# Patient Record
Sex: Female | Born: 1965 | Race: White | Hispanic: No | Marital: Married | Smoking: Former smoker
Health system: Southern US, Community
[De-identification: ages and names within clinical notes are randomized; demographics above are authoritative.]

## PROBLEM LIST (undated history)

## (undated) DIAGNOSIS — G4733 Obstructive sleep apnea (adult) (pediatric): Secondary | ICD-10-CM

## (undated) DIAGNOSIS — G4700 Insomnia, unspecified: Secondary | ICD-10-CM

## (undated) DIAGNOSIS — K649 Unspecified hemorrhoids: Secondary | ICD-10-CM

## (undated) DIAGNOSIS — I499 Cardiac arrhythmia, unspecified: Secondary | ICD-10-CM

## (undated) DIAGNOSIS — G4730 Sleep apnea, unspecified: Secondary | ICD-10-CM

## (undated) DIAGNOSIS — N189 Chronic kidney disease, unspecified: Secondary | ICD-10-CM

## (undated) DIAGNOSIS — H269 Unspecified cataract: Secondary | ICD-10-CM

## (undated) DIAGNOSIS — E282 Polycystic ovarian syndrome: Secondary | ICD-10-CM

## (undated) DIAGNOSIS — K219 Gastro-esophageal reflux disease without esophagitis: Secondary | ICD-10-CM

## (undated) DIAGNOSIS — R011 Cardiac murmur, unspecified: Secondary | ICD-10-CM

## (undated) DIAGNOSIS — F419 Anxiety disorder, unspecified: Secondary | ICD-10-CM

## (undated) DIAGNOSIS — L03119 Cellulitis of unspecified part of limb: Secondary | ICD-10-CM

## (undated) DIAGNOSIS — R42 Dizziness and giddiness: Secondary | ICD-10-CM

## (undated) DIAGNOSIS — F32A Depression, unspecified: Secondary | ICD-10-CM

## (undated) DIAGNOSIS — E0590 Thyrotoxicosis, unspecified without thyrotoxic crisis or storm: Secondary | ICD-10-CM

## (undated) DIAGNOSIS — I509 Heart failure, unspecified: Secondary | ICD-10-CM

## (undated) DIAGNOSIS — L02419 Cutaneous abscess of limb, unspecified: Secondary | ICD-10-CM

## (undated) DIAGNOSIS — C73 Malignant neoplasm of thyroid gland: Secondary | ICD-10-CM

## (undated) DIAGNOSIS — K2970 Gastritis, unspecified, without bleeding: Secondary | ICD-10-CM

## (undated) DIAGNOSIS — T8859XA Other complications of anesthesia, initial encounter: Secondary | ICD-10-CM

## (undated) DIAGNOSIS — T7840XA Allergy, unspecified, initial encounter: Secondary | ICD-10-CM

## (undated) DIAGNOSIS — G709 Myoneural disorder, unspecified: Secondary | ICD-10-CM

## (undated) DIAGNOSIS — M1990 Unspecified osteoarthritis, unspecified site: Secondary | ICD-10-CM

## (undated) DIAGNOSIS — T4145XA Adverse effect of unspecified anesthetic, initial encounter: Secondary | ICD-10-CM

## (undated) DIAGNOSIS — F329 Major depressive disorder, single episode, unspecified: Secondary | ICD-10-CM

## (undated) DIAGNOSIS — I10 Essential (primary) hypertension: Secondary | ICD-10-CM

## (undated) DIAGNOSIS — D649 Anemia, unspecified: Secondary | ICD-10-CM

## (undated) DIAGNOSIS — E785 Hyperlipidemia, unspecified: Secondary | ICD-10-CM

## (undated) HISTORY — DX: Depression, unspecified: F32.A

## (undated) HISTORY — PX: CHOLECYSTECTOMY: SHX55

## (undated) HISTORY — DX: Polycystic ovarian syndrome: E28.2

## (undated) HISTORY — PX: TUBAL LIGATION: SHX77

## (undated) HISTORY — DX: Essential (primary) hypertension: I10

## (undated) HISTORY — DX: Hyperlipidemia, unspecified: E78.5

## (undated) HISTORY — DX: Insomnia, unspecified: G47.00

## (undated) HISTORY — DX: Myoneural disorder, unspecified: G70.9

## (undated) HISTORY — DX: Anemia, unspecified: D64.9

## (undated) HISTORY — DX: Sleep apnea, unspecified: G47.30

## (undated) HISTORY — DX: Unspecified cataract: H26.9

## (undated) HISTORY — DX: Cardiac murmur, unspecified: R01.1

## (undated) HISTORY — DX: Malignant neoplasm of thyroid gland: C73

## (undated) HISTORY — PX: HEMORRHOID SURGERY: SHX153

## (undated) HISTORY — DX: Major depressive disorder, single episode, unspecified: F32.9

## (undated) HISTORY — PX: EYE SURGERY: SHX253

## (undated) HISTORY — DX: Allergy, unspecified, initial encounter: T78.40XA

## (undated) HISTORY — DX: Dizziness and giddiness: R42

## (undated) HISTORY — PX: TONSILLECTOMY AND ADENOIDECTOMY: SUR1326

## (undated) HISTORY — DX: Gastritis, unspecified, without bleeding: K29.70

---
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
## 1999-07-15 ENCOUNTER — Emergency Department (HOSPITAL_COMMUNITY): Admission: EM | Admit: 1999-07-15 | Discharge: 1999-07-15 | Payer: Self-pay | Admitting: *Deleted

## 1999-07-31 ENCOUNTER — Ambulatory Visit (HOSPITAL_COMMUNITY): Admission: RE | Admit: 1999-07-31 | Discharge: 1999-07-31 | Payer: Self-pay | Admitting: Gastroenterology

## 1999-11-02 ENCOUNTER — Other Ambulatory Visit: Admission: RE | Admit: 1999-11-02 | Discharge: 1999-11-02 | Payer: Self-pay | Admitting: Family Medicine

## 2001-01-08 ENCOUNTER — Emergency Department (HOSPITAL_COMMUNITY): Admission: EM | Admit: 2001-01-08 | Discharge: 2001-01-08 | Payer: Self-pay | Admitting: Emergency Medicine

## 2001-01-13 ENCOUNTER — Other Ambulatory Visit: Admission: RE | Admit: 2001-01-13 | Discharge: 2001-01-13 | Payer: Self-pay | Admitting: Obstetrics and Gynecology

## 2002-06-16 ENCOUNTER — Encounter: Admission: RE | Admit: 2002-06-16 | Discharge: 2002-06-16 | Payer: Self-pay | Admitting: Occupational Medicine

## 2002-06-16 ENCOUNTER — Encounter: Payer: Self-pay | Admitting: Occupational Medicine

## 2003-03-26 ENCOUNTER — Encounter: Payer: Self-pay | Admitting: Family Medicine

## 2004-04-25 ENCOUNTER — Encounter: Admission: RE | Admit: 2004-04-25 | Discharge: 2004-04-25 | Payer: Self-pay | Admitting: Obstetrics and Gynecology

## 2004-04-27 ENCOUNTER — Ambulatory Visit: Payer: Self-pay | Admitting: Family Medicine

## 2004-05-25 ENCOUNTER — Ambulatory Visit: Payer: Self-pay | Admitting: Family Medicine

## 2004-08-31 ENCOUNTER — Ambulatory Visit: Payer: Self-pay | Admitting: Family Medicine

## 2004-10-10 ENCOUNTER — Inpatient Hospital Stay (HOSPITAL_COMMUNITY): Admission: EM | Admit: 2004-10-10 | Discharge: 2004-10-13 | Payer: Self-pay | Admitting: Emergency Medicine

## 2004-10-11 ENCOUNTER — Ambulatory Visit: Payer: Self-pay | Admitting: Cardiology

## 2004-10-12 ENCOUNTER — Ambulatory Visit: Payer: Self-pay | Admitting: Internal Medicine

## 2004-10-24 ENCOUNTER — Ambulatory Visit: Payer: Self-pay | Admitting: Family Medicine

## 2004-11-27 ENCOUNTER — Ambulatory Visit: Payer: Self-pay | Admitting: Internal Medicine

## 2004-12-27 ENCOUNTER — Ambulatory Visit: Payer: Self-pay | Admitting: Gastroenterology

## 2005-01-28 ENCOUNTER — Ambulatory Visit: Payer: Self-pay | Admitting: Psychology

## 2005-02-19 ENCOUNTER — Ambulatory Visit: Payer: Self-pay | Admitting: Family Medicine

## 2005-02-26 ENCOUNTER — Ambulatory Visit (HOSPITAL_COMMUNITY): Admission: RE | Admit: 2005-02-26 | Discharge: 2005-02-26 | Payer: Self-pay | Admitting: Surgery

## 2005-02-26 ENCOUNTER — Encounter (INDEPENDENT_AMBULATORY_CARE_PROVIDER_SITE_OTHER): Payer: Self-pay | Admitting: *Deleted

## 2005-08-29 ENCOUNTER — Ambulatory Visit: Payer: Self-pay | Admitting: Family Medicine

## 2005-09-10 ENCOUNTER — Ambulatory Visit: Payer: Self-pay | Admitting: Internal Medicine

## 2005-09-18 ENCOUNTER — Encounter: Admission: RE | Admit: 2005-09-18 | Discharge: 2005-12-17 | Payer: Self-pay | Admitting: Family Medicine

## 2005-12-05 ENCOUNTER — Encounter: Admission: RE | Admit: 2005-12-05 | Discharge: 2006-03-05 | Payer: Self-pay | Admitting: Family Medicine

## 2006-01-17 ENCOUNTER — Encounter: Admission: RE | Admit: 2006-01-17 | Discharge: 2006-04-17 | Payer: Self-pay | Admitting: Family Medicine

## 2006-01-24 ENCOUNTER — Ambulatory Visit: Payer: Self-pay | Admitting: Family Medicine

## 2006-03-26 ENCOUNTER — Ambulatory Visit: Payer: Self-pay | Admitting: Family Medicine

## 2006-04-09 ENCOUNTER — Ambulatory Visit: Payer: Self-pay | Admitting: Family Medicine

## 2006-05-12 ENCOUNTER — Ambulatory Visit: Payer: Self-pay | Admitting: Family Medicine

## 2006-06-17 ENCOUNTER — Ambulatory Visit: Payer: Self-pay | Admitting: Family Medicine

## 2006-06-17 LAB — CONVERTED CEMR LAB
ALT: 17 units/L (ref 0–40)
BUN: 5 mg/dL — ABNORMAL LOW (ref 6–23)
Basophils Absolute: 0.4 10*3/uL — ABNORMAL HIGH (ref 0.0–0.1)
Calcium: 9.2 mg/dL (ref 8.4–10.5)
Chloride: 107 meq/L (ref 96–112)
Eosinophils Absolute: 0.5 10*3/uL (ref 0.0–0.6)
Eosinophils Relative: 5.8 % — ABNORMAL HIGH (ref 0.0–5.0)
GFR calc Af Amer: 176 mL/min
Glucose, Bld: 118 mg/dL — ABNORMAL HIGH (ref 70–99)
Monocytes Absolute: 0.2 10*3/uL (ref 0.2–0.7)
Neutro Abs: 6.2 10*3/uL (ref 1.4–7.7)
Phosphorus: 3.8 mg/dL (ref 2.3–4.6)
Potassium: 4.2 meq/L (ref 3.5–5.1)
RBC: 3.97 M/uL (ref 3.87–5.11)

## 2006-07-07 ENCOUNTER — Ambulatory Visit: Payer: Self-pay | Admitting: Family Medicine

## 2006-07-14 ENCOUNTER — Ambulatory Visit: Payer: Self-pay | Admitting: Gastroenterology

## 2006-07-14 LAB — FECAL OCCULT BLOOD, GUAIAC: Fecal Occult Blood: POSITIVE

## 2006-07-18 ENCOUNTER — Ambulatory Visit: Payer: Self-pay | Admitting: Family Medicine

## 2006-09-11 ENCOUNTER — Encounter: Payer: Self-pay | Admitting: Family Medicine

## 2006-09-11 DIAGNOSIS — K644 Residual hemorrhoidal skin tags: Secondary | ICD-10-CM | POA: Insufficient documentation

## 2006-09-11 DIAGNOSIS — E1165 Type 2 diabetes mellitus with hyperglycemia: Secondary | ICD-10-CM | POA: Insufficient documentation

## 2006-09-11 DIAGNOSIS — J45909 Unspecified asthma, uncomplicated: Secondary | ICD-10-CM | POA: Insufficient documentation

## 2006-09-11 DIAGNOSIS — E282 Polycystic ovarian syndrome: Secondary | ICD-10-CM | POA: Insufficient documentation

## 2006-09-11 DIAGNOSIS — F418 Other specified anxiety disorders: Secondary | ICD-10-CM | POA: Insufficient documentation

## 2006-09-11 DIAGNOSIS — E079 Disorder of thyroid, unspecified: Secondary | ICD-10-CM | POA: Insufficient documentation

## 2006-09-11 DIAGNOSIS — E1142 Type 2 diabetes mellitus with diabetic polyneuropathy: Secondary | ICD-10-CM | POA: Insufficient documentation

## 2006-09-11 DIAGNOSIS — D509 Iron deficiency anemia, unspecified: Secondary | ICD-10-CM | POA: Insufficient documentation

## 2006-09-11 DIAGNOSIS — E1140 Type 2 diabetes mellitus with diabetic neuropathy, unspecified: Secondary | ICD-10-CM | POA: Insufficient documentation

## 2006-11-05 ENCOUNTER — Telehealth: Payer: Self-pay | Admitting: Family Medicine

## 2007-02-06 ENCOUNTER — Ambulatory Visit: Payer: Self-pay | Admitting: Family Medicine

## 2007-03-09 ENCOUNTER — Ambulatory Visit: Payer: Self-pay | Admitting: Family Medicine

## 2007-03-09 DIAGNOSIS — G478 Other sleep disorders: Secondary | ICD-10-CM | POA: Insufficient documentation

## 2007-03-10 LAB — CONVERTED CEMR LAB
Basophils Relative: 0.3 % (ref 0.0–1.0)
Calcium: 9.1 mg/dL (ref 8.4–10.5)
Chloride: 101 meq/L (ref 96–112)
Cholesterol: 193 mg/dL (ref 0–200)
Creatinine, Ser: 0.6 mg/dL (ref 0.4–1.2)
Eosinophils Relative: 7.6 % — ABNORMAL HIGH (ref 0.0–5.0)
Glucose, Bld: 218 mg/dL — ABNORMAL HIGH (ref 70–99)
HDL: 30 mg/dL — ABNORMAL LOW (ref >39.0)
Hgb A1c MFr Bld: 7.3 % — ABNORMAL HIGH (ref 4.6–6.0)
Lymphocytes Relative: 17.3 % (ref 12.0–46.0)
MCHC: 33.8 g/dL (ref 30.0–36.0)
Monocytes Absolute: 0.4 10*3/uL (ref 0.2–0.7)
Phosphorus: 3.3 mg/dL (ref 2.3–4.6)
Platelets: 267 10*3/uL (ref 150–400)
Potassium: 4.2 meq/L (ref 3.5–5.1)
RDW: 14.7 % — ABNORMAL HIGH (ref 11.5–14.6)
WBC: 8.9 10*3/uL (ref 4.5–10.5)

## 2007-05-14 ENCOUNTER — Ambulatory Visit: Payer: Self-pay | Admitting: Family Medicine

## 2007-05-14 DIAGNOSIS — E782 Mixed hyperlipidemia: Secondary | ICD-10-CM | POA: Insufficient documentation

## 2007-05-14 DIAGNOSIS — E1169 Type 2 diabetes mellitus with other specified complication: Secondary | ICD-10-CM | POA: Insufficient documentation

## 2007-06-02 ENCOUNTER — Telehealth: Payer: Self-pay | Admitting: Family Medicine

## 2007-06-30 ENCOUNTER — Ambulatory Visit: Payer: Self-pay | Admitting: Family Medicine

## 2007-07-02 ENCOUNTER — Telehealth: Payer: Self-pay | Admitting: Family Medicine

## 2007-07-02 LAB — CONVERTED CEMR LAB
Hgb A1c MFr Bld: 7.1 % — ABNORMAL HIGH (ref 4.6–6.0)
Total CHOL/HDL Ratio: 5.7
Triglycerides: 132 mg/dL (ref 0–149)

## 2007-07-13 ENCOUNTER — Ambulatory Visit: Payer: Self-pay | Admitting: Family Medicine

## 2007-08-24 ENCOUNTER — Telehealth: Payer: Self-pay | Admitting: Family Medicine

## 2007-10-13 ENCOUNTER — Ambulatory Visit: Payer: Self-pay | Admitting: Family Medicine

## 2007-10-15 LAB — CONVERTED CEMR LAB
Albumin: 3.7 g/dL (ref 3.5–5.2)
BUN: 8 mg/dL (ref 6–23)
Calcium: 9.2 mg/dL (ref 8.4–10.5)
Cholesterol: 161 mg/dL (ref 0–200)
Creatinine, Ser: 0.7 mg/dL (ref 0.4–1.2)
GFR calc Af Amer: 119 mL/min
Glucose, Bld: 167 mg/dL — ABNORMAL HIGH (ref 70–99)
Hgb A1c MFr Bld: 6.1 % — ABNORMAL HIGH (ref 4.6–6.0)
Phosphorus: 4 mg/dL (ref 2.3–4.6)
Sodium: 138 meq/L (ref 135–145)
Triglycerides: 275 mg/dL (ref 0–149)

## 2007-11-03 ENCOUNTER — Ambulatory Visit: Payer: Self-pay | Admitting: Family Medicine

## 2007-11-03 DIAGNOSIS — I10 Essential (primary) hypertension: Secondary | ICD-10-CM | POA: Insufficient documentation

## 2008-02-13 ENCOUNTER — Ambulatory Visit: Payer: Self-pay | Admitting: Internal Medicine

## 2008-02-13 ENCOUNTER — Encounter: Payer: Self-pay | Admitting: Internal Medicine

## 2008-02-13 ENCOUNTER — Emergency Department (HOSPITAL_COMMUNITY): Admission: EM | Admit: 2008-02-13 | Discharge: 2008-02-13 | Payer: Self-pay | Admitting: Emergency Medicine

## 2008-02-17 ENCOUNTER — Telehealth: Payer: Self-pay | Admitting: Family Medicine

## 2008-04-20 ENCOUNTER — Ambulatory Visit: Payer: Self-pay | Admitting: Family Medicine

## 2008-04-21 LAB — CONVERTED CEMR LAB
Albumin: 3.5 g/dL (ref 3.5–5.2)
Basophils Relative: 0.3 % (ref 0.0–3.0)
Creatinine,U: 305.8 mg/dL
Eosinophils Relative: 9.6 % — ABNORMAL HIGH (ref 0.0–5.0)
Hemoglobin: 12.6 g/dL (ref 12.0–15.0)
Hgb A1c MFr Bld: 6.7 % — ABNORMAL HIGH (ref 4.6–6.0)
Lymphocytes Relative: 17.3 % (ref 12.0–46.0)
MCV: 85.8 fL (ref 78.0–100.0)
Microalb, Ur: 3.9 mg/dL — ABNORMAL HIGH (ref 0.0–1.9)
Monocytes Absolute: 0.6 10*3/uL (ref 0.1–1.0)
Monocytes Relative: 5.3 % (ref 3.0–12.0)
Neutrophils Relative %: 67.5 % (ref 43.0–77.0)
Phosphorus: 3.9 mg/dL (ref 2.3–4.6)
Platelets: 309 10*3/uL (ref 150–400)
RBC: 4.41 M/uL (ref 3.87–5.11)
RDW: 14.8 % — ABNORMAL HIGH (ref 11.5–14.6)
Total CHOL/HDL Ratio: 6
Triglycerides: 89 mg/dL (ref 0–149)

## 2008-04-27 ENCOUNTER — Ambulatory Visit: Payer: Self-pay | Admitting: Family Medicine

## 2008-04-27 DIAGNOSIS — K219 Gastro-esophageal reflux disease without esophagitis: Secondary | ICD-10-CM | POA: Insufficient documentation

## 2008-05-20 ENCOUNTER — Telehealth: Payer: Self-pay | Admitting: Family Medicine

## 2008-06-22 ENCOUNTER — Telehealth: Payer: Self-pay | Admitting: Family Medicine

## 2008-07-15 ENCOUNTER — Ambulatory Visit: Payer: Self-pay | Admitting: Family Medicine

## 2008-07-15 ENCOUNTER — Telehealth: Payer: Self-pay | Admitting: Family Medicine

## 2008-07-17 LAB — CONVERTED CEMR LAB
AST: 17 units/L (ref 0–37)
HDL: 28.1 mg/dL — ABNORMAL LOW (ref >39.00)
Hgb A1c MFr Bld: 6.9 % — ABNORMAL HIGH (ref 4.6–6.5)
LDL Cholesterol: 135 mg/dL — ABNORMAL HIGH (ref 0–99)
VLDL: 22.8 mg/dL (ref 0.0–40.0)

## 2008-07-20 ENCOUNTER — Ambulatory Visit: Payer: Self-pay | Admitting: Family Medicine

## 2008-08-17 ENCOUNTER — Telehealth: Payer: Self-pay | Admitting: Family Medicine

## 2008-08-19 ENCOUNTER — Ambulatory Visit: Payer: Self-pay | Admitting: Family Medicine

## 2008-08-19 LAB — CONVERTED CEMR LAB
Glucose, Urine, Semiquant: NEGATIVE
Nitrite: NEGATIVE
Protein, U semiquant: 100
Specific Gravity, Urine: >=1.03
Urobilinogen, UA: 0.2

## 2008-08-23 ENCOUNTER — Telehealth (INDEPENDENT_AMBULATORY_CARE_PROVIDER_SITE_OTHER): Payer: Self-pay | Admitting: Internal Medicine

## 2008-09-01 ENCOUNTER — Encounter (INDEPENDENT_AMBULATORY_CARE_PROVIDER_SITE_OTHER): Payer: Self-pay | Admitting: *Deleted

## 2008-09-27 ENCOUNTER — Telehealth: Payer: Self-pay | Admitting: Family Medicine

## 2008-09-30 ENCOUNTER — Ambulatory Visit: Payer: Self-pay | Admitting: Family Medicine

## 2008-10-03 LAB — CONVERTED CEMR LAB
ALT: 23 units/L (ref 0–35)
AST: 19 units/L (ref 0–37)
Triglycerides: 113 mg/dL (ref 0.0–149.0)
VLDL: 22.6 mg/dL (ref 0.0–40.0)

## 2008-10-26 ENCOUNTER — Telehealth: Payer: Self-pay | Admitting: Family Medicine

## 2008-11-04 ENCOUNTER — Telehealth: Payer: Self-pay | Admitting: Family Medicine

## 2008-11-09 ENCOUNTER — Encounter: Payer: Self-pay | Admitting: Family Medicine

## 2008-11-14 ENCOUNTER — Telehealth: Payer: Self-pay | Admitting: Family Medicine

## 2009-01-03 ENCOUNTER — Ambulatory Visit: Payer: Self-pay | Admitting: Family Medicine

## 2009-01-04 LAB — CONVERTED CEMR LAB
ALT: 27 units/L (ref 0–35)
AST: 21 units/L (ref 0–37)
Cholesterol: 132 mg/dL (ref 0–200)

## 2009-02-27 ENCOUNTER — Encounter: Payer: Self-pay | Admitting: Family Medicine

## 2009-02-27 ENCOUNTER — Ambulatory Visit: Payer: Self-pay | Admitting: Family Medicine

## 2009-03-20 ENCOUNTER — Telehealth: Payer: Self-pay | Admitting: Family Medicine

## 2009-03-25 HISTORY — PX: INTRAUTERINE DEVICE INSERTION: SHX323

## 2009-03-31 ENCOUNTER — Ambulatory Visit: Payer: Self-pay | Admitting: Family Medicine

## 2009-04-01 LAB — CONVERTED CEMR LAB: Hgb A1c MFr Bld: 7.4 % — ABNORMAL HIGH (ref 4.6–6.5)

## 2009-05-04 ENCOUNTER — Ambulatory Visit: Payer: Self-pay | Admitting: Family Medicine

## 2009-06-07 ENCOUNTER — Ambulatory Visit (HOSPITAL_COMMUNITY): Admission: RE | Admit: 2009-06-07 | Discharge: 2009-06-07 | Payer: Self-pay | Admitting: Orthopaedic Surgery

## 2009-06-23 HISTORY — PX: SHOULDER SURGERY: SHX246

## 2009-07-27 ENCOUNTER — Ambulatory Visit (HOSPITAL_BASED_OUTPATIENT_CLINIC_OR_DEPARTMENT_OTHER): Admission: RE | Admit: 2009-07-27 | Discharge: 2009-07-28 | Payer: Self-pay | Admitting: Orthopedic Surgery

## 2009-08-01 ENCOUNTER — Telehealth: Payer: Self-pay | Admitting: Family Medicine

## 2009-08-02 ENCOUNTER — Telehealth: Payer: Self-pay | Admitting: Family Medicine

## 2009-08-10 ENCOUNTER — Encounter: Payer: Self-pay | Admitting: Family Medicine

## 2009-08-14 ENCOUNTER — Ambulatory Visit: Payer: Self-pay | Admitting: Family Medicine

## 2009-08-14 LAB — CONVERTED CEMR LAB
BUN: 9 mg/dL (ref 6–23)
Calcium: 9.2 mg/dL (ref 8.4–10.5)
Chloride: 103 meq/L (ref 96–112)
GFR calc non Af Amer: 122.58 mL/min (ref >60)
Glucose, Bld: 208 mg/dL — ABNORMAL HIGH (ref 70–99)
HDL: 37.7 mg/dL — ABNORMAL LOW (ref >39.00)
Hgb A1c MFr Bld: 7.8 % — ABNORMAL HIGH (ref 4.6–6.5)
LDL Cholesterol: 65 mg/dL (ref 0–99)
Phosphorus: 4.4 mg/dL (ref 2.3–4.6)
Potassium: 4.3 meq/L (ref 3.5–5.1)
Sodium: 141 meq/L (ref 135–145)

## 2009-08-17 ENCOUNTER — Ambulatory Visit: Payer: Self-pay | Admitting: Family Medicine

## 2009-08-17 DIAGNOSIS — K648 Other hemorrhoids: Secondary | ICD-10-CM | POA: Insufficient documentation

## 2009-08-23 ENCOUNTER — Encounter: Payer: Self-pay | Admitting: Family Medicine

## 2009-08-23 LAB — CONVERTED CEMR LAB
Basophils Absolute: 0 10*3/uL (ref 0.0–0.1)
Eosinophils Absolute: 0.3 10*3/uL (ref 0.0–0.7)
Hemoglobin: 8.2 g/dL — ABNORMAL LOW (ref 12.0–15.0)
Lymphs Abs: 1.4 10*3/uL (ref 0.7–4.0)
MCHC: 31.6 g/dL (ref 30.0–36.0)
MCV: 85.1 fL (ref 78.0–100.0)
Monocytes Absolute: 0.4 10*3/uL (ref 0.1–1.0)
Neutrophils Relative %: 72.4 % (ref 43.0–77.0)
Platelets: 299 10*3/uL (ref 150.0–400.0)
Saturation Ratios: 14.8 % — ABNORMAL LOW (ref 20.0–50.0)

## 2009-09-22 ENCOUNTER — Ambulatory Visit: Payer: Self-pay | Admitting: Family Medicine

## 2009-09-27 LAB — CONVERTED CEMR LAB
Basophils Absolute: 0.1 10*3/uL (ref 0.0–0.1)
Eosinophils Absolute: 0.4 10*3/uL (ref 0.0–0.7)
MCV: 85.7 fL (ref 78.0–100.0)
Monocytes Absolute: 0.4 10*3/uL (ref 0.1–1.0)
Monocytes Relative: 5 % (ref 3.0–12.0)
Neutro Abs: 6.8 10*3/uL (ref 1.4–7.7)
Neutrophils Relative %: 77.2 % — ABNORMAL HIGH (ref 43.0–77.0)
RBC: 3.71 M/uL — ABNORMAL LOW (ref 3.87–5.11)
RDW: 18.4 % — ABNORMAL HIGH (ref 11.5–14.6)
WBC: 8.8 10*3/uL (ref 4.5–10.5)

## 2009-10-31 ENCOUNTER — Ambulatory Visit: Payer: Self-pay | Admitting: Gastroenterology

## 2009-10-31 ENCOUNTER — Encounter (INDEPENDENT_AMBULATORY_CARE_PROVIDER_SITE_OTHER): Payer: Self-pay | Admitting: *Deleted

## 2010-01-12 ENCOUNTER — Ambulatory Visit: Payer: Self-pay | Admitting: Family Medicine

## 2010-01-15 LAB — CONVERTED CEMR LAB
Basophils Absolute: 0.1 10*3/uL (ref 0.0–0.1)
Basophils Relative: 0.6 % (ref 0.0–3.0)
Eosinophils Absolute: 0.3 10*3/uL (ref 0.0–0.7)
Eosinophils Relative: 3.5 % (ref 0.0–5.0)
HCT: 33 % — ABNORMAL LOW (ref 36.0–46.0)
Lymphs Abs: 2 10*3/uL (ref 0.7–4.0)
MCHC: 33.2 g/dL (ref 30.0–36.0)
Monocytes Absolute: 0.5 10*3/uL (ref 0.1–1.0)
Neutro Abs: 6 10*3/uL (ref 1.4–7.7)
RBC: 3.84 M/uL — ABNORMAL LOW (ref 3.87–5.11)
RDW: 17.1 % — ABNORMAL HIGH (ref 11.5–14.6)
WBC: 8.8 10*3/uL (ref 4.5–10.5)

## 2010-01-16 ENCOUNTER — Ambulatory Visit: Payer: Self-pay | Admitting: Gastroenterology

## 2010-01-18 ENCOUNTER — Encounter: Payer: Self-pay | Admitting: Gastroenterology

## 2010-01-30 ENCOUNTER — Encounter: Payer: Self-pay | Admitting: Family Medicine

## 2010-04-16 ENCOUNTER — Ambulatory Visit: Admit: 2010-04-16 | Payer: Self-pay | Admitting: Family Medicine

## 2010-04-24 NOTE — Assessment & Plan Note (Signed)
GI PROBLEM LIST: 1. Symptomatic external hemorrhoids.  Status post colonoscopy October 2006 was otherwise normal.  Had surgical evaluation by Dr. Marcille Blanco.  Hemorrhoid treatments.  Very painful and disagreeable to patient.  This was in 2007.  Bleeds once to twice weekly from her hemorrhoids, significant volume per her. 2. Iron-deficiency anemia.  March 2008 labs show hemoglobin 10.9, ferritin 8.8, likely from significant external hemorrhoids.  See above. 3. Family history of colon cancer.  Last colonoscopy October 2006, next due October 2011.     History of Present Illness Visit Type: Initial Consult Primary GI MD: Rob Bunting MD Primary Provider: Roxy Manns, MD Requesting Provider: Roxy Manns, MD Chief Complaint: anemia History of Present Illness:     very pleasant 45 year old woman whom I last saw over 3 years ago.  who was found to have lower than usual Hb (8.2).   she was told to double her iron and 6 weeks later her hemoglobin was 10.3.    she has had chronic anemia, likely from daily bleeding hemorrhoids, see above summary.  She bleeds red blood every day.  She will really have to strain, and has blood and blood clots.    has trouble with knot in stomach, worse at night, can cuase SOB.  Knows she has panic attacks.    takes protonix daily and if she misses, she really has GERD.           Current Medications (verified): 1)  Micardis 40 Mg  Tabs (Telmisartan) .... Take One By Mouth Daily 2)  Zoloft 50 Mg  Tabs (Sertraline Hcl) .... Take One By Mouth At Bedtime 3)  Metformin Hcl 1000 Mg  Tabs (Metformin Hcl) .... Take 1 By Mouth Two Times A Day 4)  Ferrous Sulfate 325 (65 Fe) Mg  Tbec (Ferrous Sulfate) .... Take One By Mouth Two Times A Day. 5)  Wellbutrin Xl 150 Mg  Tb24 (Bupropion Hcl) .Marland Kitchen.. 1 By Mouth Once Daily 6)  Ambien 10 Mg  Tabs (Zolpidem Tartrate) .... 1/2 To 1 By Mouth At Bedtime Prn 7)  Proair Hfa 108 (90 Base) Mcg/act  Aers (Albuterol Sulfate) .... 2  Puffs Q 4 Hours As Needed Wheeze 8)  Diabetes Test Strips .... To Check Sugar Two Times A Day and As Needed For Dm 250.0 9)  Meclizine Hcl 25 Mg Tabs (Meclizine Hcl) .Marland Kitchen.. 1 By Mouth Two Times A Day As Needed Vertigo 10)  Protonix 40 Mg Tbec (Pantoprazole Sodium) .Marland Kitchen.. 1 By Mouth Once Daily 11)  Fish Oil   Oil (Fish Oil) .... Take 2  Tablets By Mouth Once A Day 12)  One Daily Womens  Tabs (Multiple Vitamins-Minerals) .... Take 1 Tablet By Mouth Once A Day 13)  Zocor 20 Mg Tabs (Simvastatin) .Marland Kitchen.. 1 By Mouth Once Daily in Evening 14)  Amaryl 2 Mg Tabs (Glimepiride) .Marland Kitchen.. 1 By Mouth Once Daily in Am  Allergies (verified): 1)  ! * Glucotrol 2)  ! Percocet 3)  Buspar  Vital Signs:  Patient profile:   45 year old female Height:      65 inches Weight:      293.13 pounds BMI:     48.96 Pulse rate:   88 / minute Pulse rhythm:   regular BP sitting:   160 / 80  (left arm) Cuff size:   large  Vitals Entered By: June McMurray CMA Duncan Dull) (October 31, 2009 3:37 PM)  Physical Exam  Additional Exam:  Constitutional: generally well appearing except for morbid  obesity Psychiatric: alert and oriented times 3 Abdomen: soft, non-tender, non-distended, normal bowel sounds    Impression & Recommendations:  Problem # 1:  chronic GERD, dyspepsia will proceed with EGD at the same time as colonoscopy  Problem # 2:  family history colon cancer, chronic rectal bleeding her rectal bleeding is likely hemorrhoidal. I saw her 3 years ago about the same issue and recommended she get back in to see a surgeon about. She is very nervous about repeating a surgical procedure however she does understand that that may indeed need to be done. I suggested we repeat her colonoscopy since she is due for high risk screening anyway. Following that I will likely schedule her for reevaluation by surgeon.  Patient Instructions: 1)  You will be scheduled to have a colonoscopy. 2)  You will be scheduled to have an upper  endoscopy. 3)  The medication list was reviewed and reconciled.  All changed / newly prescribed medications were explained.  A complete medication list was provided to the patient / caregiver.  Appended Document: Orders Update/movi    Clinical Lists Changes  Problems: Added new problem of RECTAL BLEEDING (ICD-569.3) Medications: Added new medication of MOVIPREP 100 GM  SOLR (PEG-KCL-NACL-NASULF-NA ASC-C) As per prep instructions. - Signed Rx of MOVIPREP 100 GM  SOLR (PEG-KCL-NACL-NASULF-NA ASC-C) As per prep instructions.;  #1 x 0;  Signed;  Entered by: Chales Abrahams CMA (AAMA);  Authorized by: Rachael Fee MD;  Method used: Electronically to [id number] [address] [place], [place], [place]  [address], Ph: [phone], Fax: [phone] Orders: Added new Test order of Colon/Endo (Colon/Endo) - Signed    Prescriptions: MOVIPREP 100 GM  SOLR (PEG-KCL-NACL-NASULF-NA ASC-C) As per prep instructions.  #1 x 0   Entered by:   Chales Abrahams CMA (AAMA)   Authorized by:   Rachael Fee MD   Signed by:   Chales Abrahams CMA (AAMA) on 10/31/2009   Method used:   [facility]  [address] [phone][facility] (retail)       [address]       [place], [place]  [address]       Ph: [phone]       Fax: [phone]   RxID:   [id number]

## 2010-04-24 NOTE — Assessment & Plan Note (Signed)
Summary: 6 month followup/whc   Vital Signs:  Patient profile:   45 year old female Height:      65 inches Weight:      285.75 pounds BMI:     47.72 Temp:     98.3 degrees F oral Pulse rate:   80 / minute Pulse rhythm:   regular BP sitting:   120 / 80  (left arm) Cuff size:   large  Vitals Entered By: Lewanda Rife LPN (May 04, 2009 9:30 AM)  History of Present Illness: here for f/u of DM and HTN and lipids  wt is up 12 lb is aware of this  has not been to the gym but not eating different  (had to help daughter on bedrest)  is sticking with a low sugar diet    bp 120/80- very good control  AIC is 7.4 up from 7.3 opthy exam -- was jan 23 , with [facility] opthy - all was ok   chol great with zocor LDL 66   has a new grandchild - born xmas day  very happy about that   had flu shot  bad case of gasteroenteritis  then another uri  finally feeling back to normal    Allergies: 1)  ! * Glucotrol 2)  Buspar  Past History:  Past Medical History: Last updated: 02/13/2008 Anemia-iron deficiency Asthma Depression Diabetes mellitus, type II obesity HTN  Colonoscopy- int hemorrhoids (07/1999) Admit- chest pain  Cardiac cath- neg,  chest CT- neg EF 65% (09/2004) Colonoscopy- ext hemorrhoids (12/2004)  Past Surgical History: Last updated: 02/13/2008 Cholecystectomy Hemorrhoidectomy Tubal ligation  Family History: Last updated: 04/27/2008 Father: colon cancer no DM in family  Social History: Last updated: 11/03/2007 Marital Status: Married Children: 2 Occupation: insurance Never Smoked walks for exercise   Risk Factors: Smoking Status: never (05/14/2007)  Review of Systems General:  Complains of fatigue; denies fever, loss of appetite, malaise, and sleep disorder. Eyes:  Denies blurring and eye irritation. CV:  Denies chest pain or discomfort, palpitations, shortness of breath with exertion, and swelling of feet. Resp:  Denies cough, shortness of  breath, and wheezing. GI:  Denies abdominal pain, indigestion, and nausea. GU:  Denies urinary frequency. MS:  Denies muscle aches. Derm:  Denies itching, lesion(s), poor wound healing, and rash. Neuro:  Denies numbness and tingling. Endo:  Denies excessive thirst and excessive urination. Heme:  Denies abnormal bruising and bleeding.  Physical Exam  General:  overweight but generally well appearing  Head:  normocephalic, atraumatic, and no abnormalities observed.   Eyes:  vision grossly intact, pupils equal, pupils round, and pupils reactive to light.   Mouth:  pharynx pink and moist.   Neck:  supple with full rom and no masses or thyromegally, no JVD or carotid bruit  Lungs:  Normal respiratory effort, chest expands symmetrically. Lungs are clear to auscultation, no crackles or wheezes. Heart:  Normal rate and regular rhythm. S1 and S2 normal without gallop, murmur, click, rub or other extra sounds. Abdomen:  Bowel sounds positive,abdomen soft and non-tender without masses, organomegaly or hernias noted. no renal bruits  Msk:  No deformity or scoliosis noted of thoracic or lumbar spine.   Pulses:  R and L carotid,radial,femoral,dorsalis pedis and posterior tibial pulses are full and equal bilaterally Extremities:  No clubbing, cyanosis, edema, or deformity noted with normal full range of motion of all joints.   Neurologic:  sensation intact to light touch, gait normal, and DTRs symmetrical and normal.   Skin:  Intact without suspicious lesions or rashes Cervical Nodes:  No lymphadenopathy noted Psych:  normal affect, talkative and pleasant   Diabetes Management Exam:    Foot Exam (with socks and/or shoes not present):       Sensory-Pinprick/Light touch:          Left medial foot (L-4): normal          Left dorsal foot (L-5): normal          Left lateral foot (S-1): normal          Right medial foot (L-4): normal          Right dorsal foot (L-5): normal          Right lateral foot  (S-1): normal       Sensory-Monofilament:          Left foot: normal          Right foot: normal       Inspection:          Left foot: normal          Right foot: normal       Nails:          Left foot: normal          Right foot: normal    Eye Exam:       Eye Exam done elsewhere          Date: 04/16/2009          Results: normal          Done by: Manley Mason opthy   Impression & Recommendations:  Problem # 1:  PURE HYPERCHOLESTEROLEMIA (ICD-272.0) Assessment Improved  much imp with statin and diet enc low sat fat recheck in 3 mo and f/u Her updated medication list for this problem includes:    Zocor 20 Mg Tabs (Simvastatin) .Marland Kitchen... 1 by mouth once daily in evening  Labs Reviewed: SGOT: 21 (01/03/2009)   SGPT: 27 (01/03/2009)   HDL:29.70 (01/03/2009), 30.80 (09/30/2008)  LDL:66 (01/03/2009), 84 (09/30/2008)  Chol:132 (01/03/2009), 137 (09/30/2008)  Trig:180.0 (01/03/2009), 113.0 (09/30/2008)  Orders: Prescription Created Electronically [phone])  Problem # 2:  HYPERTENSION (ICD-401.9) Assessment: Unchanged  good control on arb continue this  adv to get back to exercise  Her updated medication list for this problem includes:    Micardis 40 Mg Tabs (Telmisartan) .Marland Kitchen... Take one by mouth daily  BP today: 120/80 Prior BP: 112/78 (02/27/2009)  Labs Reviewed: K+: 4.2 (04/20/2008) Creat: : 0.7 (04/20/2008)   Chol: 132 (01/03/2009)   HDL: 29.70 (01/03/2009)   LDL: 66 (01/03/2009)   TG: 180.0 (01/03/2009)  Problem # 3:  DIABETES MELLITUS, TYPE II (ICD-250.00) Assessment: Deteriorated  not improving - due to poor exercise likely  will work on 30 min 5 days per week at home or gym wt loss is most imp up to date opthy  lab and f/u 3 mo- if not imp will add med  Her updated medication list for this problem includes:    Micardis 40 Mg Tabs (Telmisartan) .Marland Kitchen... Take one by mouth daily    Metformin Hcl 1000 Mg Tabs (Metformin hcl) .Marland Kitchen... Take 1 by mouth two times a day  Labs  Reviewed: Creat: 0.7 (04/20/2008)     Last Eye Exam: normal (03/25/2008) Reviewed HgBA1c results: 7.4 (03/31/2009)  7.3 (01/03/2009)  Complete Medication List: 1)  Micardis 40 Mg Tabs (Telmisartan) .... Take one by mouth daily 2)  Zoloft 50 Mg Tabs (Sertraline hcl) .... Take one by  mouth at bedtime 3)  Metformin Hcl 1000 Mg Tabs (Metformin hcl) .... Take 1 by mouth two times a day 4)  Ferrous Sulfate 325 (65 Fe) Mg Tbec (Ferrous sulfate) .... Take one by mouth daily 5)  Wellbutrin Xl 150 Mg Tb24 (Bupropion hcl) .Marland Kitchen.. 1 by mouth once daily 6)  Ambien 10 Mg Tabs (Zolpidem tartrate) .... 1/2 to 1 by mouth at bedtime prn 7)  Proair Hfa 108 (90 Base) Mcg/act Aers (Albuterol sulfate) .... 2 puffs q 4 hours as needed wheeze 8)  Diabetes Test Strips  .... To check sugar two times a day and as needed for dm 250.0 9)  Meclizine Hcl 25 Mg Tabs (Meclizine hcl) .Marland Kitchen.. 1 by mouth two times a day as needed vertigo 10)  Protonix 40 Mg Tbec (Pantoprazole sodium) .Marland Kitchen.. 1 by mouth once daily 11)  Fish Oil Oil (Fish oil) .... Take 2  tablets by mouth once a day 12)  One Daily Womens Tabs (Multiple vitamins-minerals) .... Take 1 tablet by mouth once a day 13)  Zocor 20 Mg Tabs (Simvastatin) .Marland Kitchen.. 1 by mouth once daily in evening  Patient Instructions: 1)  work harder on healthy diet and exercise for weight loss and sugar control  2)  keep checking sugar once daily at different times  3)  no change in medication 4)  schedule fasting labs in 3 months and then follow up lipid/ast alt/ renal / AIC 250.0 , 272 and then follow up  Prescriptions: ZOCOR 20 MG TABS (SIMVASTATIN) 1 by mouth once daily in evening  #30 x 11   Entered and Authorized by:   Judith Part MD   Signed by:   Judith Part MD on 05/04/2009   Method used:   [facility]        [id number]       [address]       [place], [place]  [address]       Ph: [phone]       Fax: [phone]    RxID:   [id number] PROTONIX 40 MG TBEC (PANTOPRAZOLE SODIUM) 1 by mouth once daily  #30 x 11   Entered and Authorized by:   Judith Part MD   Signed by:   Judith Part MD on 05/04/2009   Method used:   [facility]ically [facility]        [id number]       [address]       [place], [place]  [address]       Ph: [phone]       Fax: [phone]   RxID:   [id number] MECLIZINE HCL 25 MG TABS (MECLIZINE HCL) 1 by mouth two times a day as needed vertigo  #30 x 0   Entered and Authorized by:   Judith Part MD   Signed by:   Judith Part MD on 05/04/2009   Method used:   [facility]ically to        [id number]       [address]       [place], [place]  [address]       Ph: [phone]       Fax: [phone]   RxID:   [id number] WELLBUTRIN XL 150 MG  TB24 (BUPROPION HCL) 1 by mouth once daily  #30 x 11   Entered and Authorized by:   Judith Part MD   Signed by:   Judith Part MD on 05/04/2009   Method used:   [facility]ically to        [id number]       [address]       [place], [place]  [address]       Ph: [phone]       Fax: [phone]   RxID:   [id number] METFORMIN HCL 1000 MG  TABS (METFORMIN HCL) take 1 by mouth two times a day  #60 x 11   Entered and Authorized by:   Judith Part MD   Signed by:   Judith Part MD on 05/04/2009   Method used:   [facility]ically to        [id number]       [address]       [place], [place]  [address]       Ph: [phone]       Fax: [phone]   RxID:   [id number]   Current Allergies (reviewed today): ! * GLUCOTROL BUSPAR  Influenza Immunization History:    Influenza # 1:  Fluvax 3+ (12/23/2008)

## 2010-04-24 NOTE — Assessment & Plan Note (Signed)
Summary: FOLLOW UP/RI   Vital Signs:  Patient profile:   45 year old female Height:      65 inches Weight:      292.75 pounds BMI:     48.89 Temp:     98.8 degrees F oral Pulse rate:   80 / minute Pulse rhythm:   regular BP sitting:   130 / 80  (left arm) Cuff size:   large  Vitals Entered By: Lewanda Rife LPN (January 12, 2010 2:08 PM)  Serial Vital Signs/Assessments:  Time      Position  BP       Pulse  Resp  Temp     By                     130/80                         Judith Part MD  CC: follow up visit   History of Present Illness: here for f/u of HTN and DM and anemia has been feeling pretty good   some cold symptoms - tight chest and eye irritation sneeze and runny nose  non prod cough and no fever   having colonosc/ egd tuesday   bp today 136.84  DM - last check 7.8 AIC in may added amaryl 2mg   sugars are still too high -anywhere from high 60s to 300s  in ams averages 265 , and at night usually 300s  thinks the stress of the gi problems is part of it   is sticking to diabetic diet  a lot of stress - planning a wedding , gm is dying and driving her mother to MD every weekend, mother has MS , son laid up after accident   on iron Hb is up to 10.3 in summer- due for re check on iron  seeing gi- plans colonosc   Allergies: 1)  ! * Glucotrol 2)  ! Percocet 3)  Buspar  Past History:  Past Medical History: Last updated: 02/13/2008 Anemia-iron deficiency Asthma Depression Diabetes mellitus, type II obesity HTN  Colonoscopy- int hemorrhoids (07/1999) Admit- chest pain  Cardiac cath- neg,  chest CT- neg EF 65% (09/2004) Colonoscopy- ext hemorrhoids (12/2004)  Past Surgical History: Last updated: 08/17/2009 Cholecystectomy Hemorrhoidectomy Tubal ligation 4/11 shoulder surgery  Family History: Last updated: 04/27/2008 Father: colon cancer no DM in family  Social History: Last updated: 11/03/2007 Marital Status: Married Children:  2 Occupation: insurance Never Smoked walks for exercise   Risk Factors: Smoking Status: never (05/14/2007)  Review of Systems General:  Complains of fatigue; denies fever and malaise. Eyes:  Denies blurring and eye pain. ENT:  Complains of nasal congestion, postnasal drainage, and sore throat. CV:  Denies chest pain or discomfort, palpitations, and shortness of breath with exertion. Resp:  Denies cough and wheezing. GI:  Denies abdominal pain, change in bowel habits, and indigestion. GU:  Denies dysuria and hematuria. MS:  Denies joint pain. Derm:  Denies lesion(s), poor wound healing, and rash. Neuro:  Denies numbness and tingling. Psych:  Denies anxiety and depression. Endo:  Denies cold intolerance, excessive thirst, excessive urination, and heat intolerance. Heme:  Denies abnormal bruising and bleeding.  Physical Exam  General:  overweight but generally well appearing  Head:  normocephalic, atraumatic, and no abnormalities observed.  normocephalic, atraumatic, and no abnormalities observed.   Eyes:  vision grossly intact, pupils equal, pupils round, pupils reactive to light, and no injection.  vision grossly intact, pupils equal, pupils round, pupils reactive to light, and no injection.   Ears:  R ear normal and L ear normal.   Nose:  nares are injected and congested bilaterally  Mouth:  pharynx pink and moist, no erythema, and no exudates.  pharynx pink and moist, no erythema, and no exudates.   Neck:  supple with full rom and no masses or thyromegally, no JVD or carotid bruit  Chest Wall:  No deformities, masses, or tenderness noted. Lungs:  Normal respiratory effort, chest expands symmetrically. Lungs are clear to auscultation, no crackles or wheezes. Heart:  Normal rate and regular rhythm. S1 and S2 normal without gallop, murmur, click, rub or other extra sounds. Abdomen:  Bowel sounds positive,abdomen soft and non-tender without masses, organomegaly or hernias noted. no  renal bruits  Msk:  No deformity or scoliosis noted of thoracic or lumbar spine.  no acute joint changes Pulses:  R and L carotid,radial,femoral,dorsalis pedis and posterior tibial pulses are full and equal bilaterally Extremities:  trace left pedal edema.  trace left pedal edema.   Neurologic:  sensation intact to light touch, gait normal, and DTRs symmetrical and normal.  sensation intact to light touch, gait normal, and DTRs symmetrical and normal.   Skin:  Intact without suspicious lesions or rashes Cervical Nodes:  No lymphadenopathy noted Inguinal Nodes:  No significant adenopathy Psych:  normal affect, talkative and pleasant   Diabetes Management Exam:    Foot Exam (with socks and/or shoes not present):       Sensory-Pinprick/Light touch:          Left medial foot (L-4): normal          Left dorsal foot (L-5): normal          Left lateral foot (S-1): normal          Right medial foot (L-4): normal          Right dorsal foot (L-5): normal          Right lateral foot (S-1): normal       Sensory-Monofilament:          Left foot: normal          Right foot: normal       Inspection:          Left foot: normal          Right foot: normal       Nails:          Left foot: normal          Right foot: normal   Impression & Recommendations:  Problem # 1:  HYPERTENSION (ICD-401.9) Assessment Unchanged  continue miacardis- well controlled no change f/u 3 mo  Her updated medication list for this problem includes:    Micardis 40 Mg Tabs (Telmisartan) .Marland Kitchen... Take one by mouth daily  BP today: 136/84 Prior BP: 160/80 (10/31/2009)  Labs Reviewed: K+: 4.3 (08/14/2009) Creat: : 0.6 (08/14/2009)   Chol: 141 (08/14/2009)   HDL: 37.70 (08/14/2009)   LDL: 65 (08/14/2009)   TG: 193.0 (08/14/2009)  Orders: Prescription Created Electronically [phone])  Problem # 2:  ANEMIA-IRON DEFICIENCY (ICD-280.9) Assessment: Improved  continue iron and f/u for GI studies as planned  Her updated  medication list for this problem includes:    Ferrous Sulfate 325 (65 Fe) Mg Tbec (Ferrous sulfate) .Marland Kitchen... Take one by mouth two times a day.  Orders: Venipuncture (60454) TLB-CBC Platelet - w/Differential (85025-CBCD) TLB-A1C / Hgb A1C (Glycohemoglobin) (  98119-J4N) Prescription Created Electronically [phone])  Hgb: 10.3 (09/22/2009)   Hct: 31.8 (09/22/2009)   Platelets: 301.0 (09/22/2009) RBC: 3.71 (09/22/2009)   RDW: 18.4 (09/22/2009)   WBC: 8.8 (09/22/2009) MCV: 85.7 (09/22/2009)   MCHC: 32.4 (09/22/2009) Ferritin: 8.8 (06/17/2006) Iron: 59 (08/17/2009)   % Sat: 14.8 (08/17/2009) TSH: 2.86 (04/20/2008)  Problem # 3:  DIABETES MELLITUS, TYPE II (ICD-250.00) Assessment: Unchanged  out of control with stress per pt diet ok - but does skip breakfast (has had DM teach) disc plan for diet  needs to add exercise when able inc amaryl to 4 mg will likely need to add lanus or other insulin  lab today f/u 3 mo  Her updated medication list for this problem includes:    Micardis 40 Mg Tabs (Telmisartan) .Marland Kitchen... Take one by mouth daily    Metformin Hcl 1000 Mg Tabs (Metformin hcl) .Marland Kitchen... Take 1 by mouth two times a day    Amaryl 4 Mg Tabs (Glimepiride) .Marland Kitchen... 1 by mouth once daily  Orders: Venipuncture (21308) TLB-CBC Platelet - w/Differential (85025-CBCD) TLB-A1C / Hgb A1C (Glycohemoglobin) (83036-A1C) Prescription Created Electronically [phone])  Labs Reviewed: Creat: 0.6 (08/14/2009)     Last Eye Exam: normal (04/16/2009) Reviewed HgBA1c results: 7.8 (08/14/2009)  7.4 (03/31/2009)  Problem # 4:  URI (ICD-465.9) Assessment: New  early uri with mild cough/ post nasal drip recommend sympt care- see pt instructions   rest this weekend  re- sched GI proc if worse mon Her updated medication list for this problem includes:    Celebrex 200 Mg Caps (Celecoxib) .Marland Kitchen... Take 1 tablet by mouth once a day  Orders: Prescription Created Electronically [phone])  Complete Medication List: 1)   Micardis 40 Mg Tabs (Telmisartan) .... Take one by mouth daily 2)  Zoloft 50 Mg Tabs (Sertraline hcl) .... Take one by mouth at bedtime 3)  Metformin Hcl 1000 Mg Tabs (Metformin hcl) .... Take 1 by mouth two times a day 4)  Ferrous Sulfate 325 (65 Fe) Mg Tbec (Ferrous sulfate) .... Take one by mouth two times a day. 5)  Wellbutrin Xl 150 Mg Tb24 (Bupropion hcl) .Marland Kitchen.. 1 by mouth once daily 6)  Ambien 10 Mg Tabs (Zolpidem tartrate) .... 1/2 to 1 by mouth at bedtime prn 7)  Proair Hfa 108 (90 Base) Mcg/act Aers (Albuterol sulfate) .... 2 puffs q 4 hours as needed wheeze 8)  Diabetes Test Strips  .... To check sugar two times a day and as needed for dm 250.0 9)  Meclizine Hcl 25 Mg Tabs (Meclizine hcl) .Marland Kitchen.. 1 by mouth two times a day as needed vertigo 10)  Protonix 40 Mg Tbec (Pantoprazole sodium) .Marland Kitchen.. 1 by mouth once daily 11)  Fish Oil Oil (Fish oil) .... Take 2  tablets by mouth once a day 12)  One Daily Womens Tabs (Multiple vitamins-minerals) .... Take 1 tablet by mouth once a day 13)  Zocor 20 Mg Tabs (Simvastatin) .Marland Kitchen.. 1 by mouth once daily in evening 14)  Amaryl 4 Mg Tabs (Glimepiride) .Marland Kitchen.. 1 by mouth once daily 15)  Celebrex 200 Mg Caps (Celecoxib) .... Take 1 tablet by mouth once a day  Patient Instructions: 1)  call GI monday if you think you are sick 2)  you can try sugar free zinc losenges and get extra rest  3)  increase amaryl to 4 mg daily and eat diabetic diet  4)  make sure to eat breakfast  5)  lab today 6)  continue iron supplement 7)  lab today  8)  follow up in 3 months  Prescriptions: AMARYL 4 MG TABS (GLIMEPIRIDE) 1 by mouth once daily  #30 x 11   Entered and Authorized by:   Judith Part MD   Signed by:   Judith Part MD on 01/12/2010   Method used:   Electronically to        [id number]       [address]       [place], [place]  [address]       Ph: [phone]       Fax: [phone]   RxID:    [id number] DIABETES TEST STRIPS to check sugar two times a day and as needed for DM 250.0  #100 x 3   Entered and Authorized by:   Judith Part MD   Signed by:   Judith Part MD on 01/12/2010   Method used:   Print then Give to Patient   RxID:   [id number] PROAIR HFA 108 (90 BASE) MCG/ACT  AERS (ALBUTEROL SULFATE) 2 puffs q 4 hours as needed wheeze  #1 mdi x 11   Entered and Authorized by:   Judith Part MD   Signed by:   Judith Part MD on 01/12/2010   Method used:   [facility]        [facility] [address] [id number]       [address]       [place], [place]  [address]       Ph: [phone]       Fax: [phone]   RxID:   [id number]    Orders Added: 1)  Venipuncture [20254] 2)  TLB-CBC Platelet - w/Differential [85025-CBCD] 3)  TLB-A1C / Hgb A1C (Glycohemoglobin) [83036-A1C] 4)  Prescription Created Electronically [G8553] 5)  Est. Patient Level IV [27062]   Immunization History:  Influenza Immunization History:    Influenza:  historical (01/03/2010)   Immunization History:  Influenza Immunization History:    Influenza:  Historical (01/03/2010)  Current Allergies (reviewed today): ! * GLUCOTROL ! PERCOCET BUSPAR

## 2010-04-24 NOTE — Progress Notes (Signed)
Summary: yeast infection   Phone Note Call from Patient Call back at Home Phone [phone]   Caller: Patient Call For: Judith Part MD Summary of Call: Patient says that she has a  yeast infection from all the medications she has been taking. She has  itching, burning, cottage cheese discharge. She is asking if she can have a rx sent to [address]. Please advise.  Initial call taken by: Melody Comas,  Aug 02, 2009 10:02 AM  Follow-up for Phone Call        px written on EMR for call in - diflucan f/u if not imp next week Follow-up by: Judith Part MD,  Aug 02, 2009 10:59 AM  Additional Follow-up for Phone Call Additional follow up Details #1::        Patient notified as instructed by telephone. Medication phoned to [facility] as instructed. Lewanda Rife LPN  Aug 02, 2009 1:05 PM     New/Updated Medications: DIFLUCAN 150 MG TABS (FLUCONAZOLE) 1 by mouth times one for yeast infx Prescriptions: DIFLUCAN 150 MG TABS (FLUCONAZOLE) 1 by mouth times one for yeast infx  #1 x 0   Entered and Authorized by:   Judith Part MD   Signed by:   Lewanda Rife LPN on 21/30/8657   Method used:   Telephoned to ...       [facility] [id number]       [address]       [place], [place]  [address]       Ph: [phone]       Fax: [phone]   RxID:   [id number]

## 2010-04-24 NOTE — Procedures (Signed)
Summary: Colonoscopy  Patient: [name] Note: All result statuses are Final unless otherwise noted.  Tests: (1) Colonoscopy (COL)   COL Colonoscopy           [facility]ONE      [facility]     [address]     [place], [place]  [address]           COLONOSCOPY PROCEDURE REPORT           PATIENT:  [name], [name]  MR#:  [id number]     BIRTHDATE:  08/18/1965, 44 yrs. old  GENDER:  female     ENDOSCOPIST:  Rachael Fee, MD     PROCEDURE DATE:  01/16/2010     PROCEDURE:  Colonoscopy with snare polypectomy     ASA CLASS:  Class II     INDICATIONS:  father had colon cancer, pt has daily rectal     bleeding from known hemorrhoids, last colonoscoyp 2006     MEDICATIONS:  Fentanyl 100 mcg IV, Versed 10 mg IV           DESCRIPTION OF PROCEDURE:   After the risks benefits and     alternatives of the procedure were thoroughly explained, informed     consent was obtained.  Digital rectal exam was performed and     revealed no rectal masses.   The LB [id number] endoscope     was introduced through the anus and advanced to the cecum, which     was identified by both the appendix and ileocecal valve, without     limitations.  The quality of the prep was adequate, using     MoviPrep.  The instrument was then slowly withdrawn as the colon     was fully examined.     <<PROCEDUREIMAGES>>     FINDINGS:  Three small sessile polyps were found. These ranged in     size from 3mm to 4mm. All were removed with cold snare and all     were sent to pathology (jar 1). These were located in descending     and rectal segments (see image4 and image3).  Medium sized     internal and external hemorrhoids were found.  This was otherwise     a normal examination of the colon (see image5, image1, and     image2).   Retroflexed views in the rectum revealed no     abnormalities.    The scope was then withdrawn from the patient     and the procedure completed.     COMPLICATIONS:  None        ENDOSCOPIC IMPRESSION:     1) Three small polyps; all removed and all sent to pathology     2) Medium sized internal and external hemorrhoids; these are the     source of her daily rectal bleeding and IDA     3) Otherwise normal examination           RECOMMENDATIONS:     1) Given your significant family history of colon cancer, you     should have a repeat colonoscopy in 3-5 years even if the polyps     removed today are not pre-cancerous.     2) You will receive a letter within 1-2 weeks with the results     of your biopsy as well as final recommendations. Please call my     office if you have not received a letter after 3 weeks.  3) Dr. Christella Hartigan' office will arrange return visit at [facility] to consider further hemorrhoidal therapy given     the daily, ongoing bleeding that is causing signficant anemia.           ______________________________     Rachael Fee, MD           n.     eSIGNED:   Rachael Fee at 01/16/2010 10:12 AM           Encarnacion Chu, [id number]  Note: An exclamation mark (!) indicates a result that was not dispersed into the flowsheet. Document Creation Date: 01/16/2010 10:12 AM _______________________________________________________________________  (1) Order result status: Final Collection or observation date-time: 01/16/2010 09:55 Requested date-time:  Receipt date-time:  Reported date-time:  Referring Physician:   Ordering Physician: Rob Bunting [phone]) Specimen Source:  Source: Launa Grill Order Number: [id number] Lab site:   Appended Document: Orders Update/CCS    Clinical Lists Changes  Orders: Added new Test order of [facility] ([facility]) - Signed      Appended Document: Colonoscopy     Procedures Next Due Date:    Colonoscopy: 12/2014

## 2010-04-24 NOTE — Miscellaneous (Signed)
Summary: Ferrous Sulfate update on med list  Medications Added FERROUS SULFATE 325 (65 FE) MG  TBEC (FERROUS SULFATE) take one by mouth two times a day.       Clinical Lists Changes  Medications: Changed medication from FERROUS SULFATE 325 (65 FE) MG  TBEC (FERROUS SULFATE) take one by mouth daily to FERROUS SULFATE 325 (65 FE) MG  TBEC (FERROUS SULFATE) take one by mouth two times a day.     Current Allergies: ! * GLUCOTROL ! PERCOCET BUSPAR

## 2010-04-24 NOTE — Letter (Signed)
Summary: Diabetic Instructions  [facility]  [address] [place], [place] [address]   Phone: [phone]  Fax: [phone]    [name] 1965/08/31 MRN: [id number]   X   ORAL DIABETIC MEDICATION INSTRUCTIONS  The day before your procedure:   Take your diabetic pill as you do normally  The day of your procedure:   Do not take your diabetic pill    We will check your blood sugar levels during the admission process and again in Recovery before discharging you home  ________________________________________________________________________

## 2010-04-24 NOTE — Procedures (Signed)
Summary: Colon   Colonoscopy  Procedure date:  12/27/2004  Findings:      Location:  [facility].    Colonoscopy  Procedure date:  12/27/2004  Findings:      Location:  [facility].   Patient Name: [name], [name]. MRN:  Procedure Procedures: Colonoscopy CPT: [phone].  Personnel: Endoscopist: Rachael Fee, MD.  Exam Location: Exam performed in Outpatient Clinic. Outpatient  Patient Consent: Procedure, Alternatives, Risks and Benefits discussed, consent obtained, from patient. Consent was obtained by the RN.  Indications Symptoms: Constipation  Increased Risk Screening: For family history of colorectal neoplasia, in   History  Current Medications: Patient is not currently taking Coumadin.  Pre-Exam Physical: Performed Dec 27, 2004. Cardio-pulmonary exam, Abdominal exam, Mental status exam WNL. Abnormal PE findings include: obese.  Exam Exam: Extent of exam reached: Cecum, extent intended: Cecum.  The cecum was identified by appendiceal orifice and IC valve. Colon retroflexion performed. Images taken. ASA Classification: II. Tolerance: good.  Monitoring: Pulse and BP monitoring, Oximetry used. Supplemental O2 given.  Colon Prep Prep results: good.  Sedation Meds: Patient assessed and found to be appropriate for moderate (conscious) sedation. Fentanyl 75 mcg. given IV. Versed 7 mg. given IV.  Findings - NORMAL EXAM: Cecum to Rectum. Comments: Otherwise normal examination.  - HEMORRHOIDS: External. Size: Medium. ICD9: Hemorrhoids, External: 455.3.   Assessment  Diagnoses: 455.3: Hemorrhoids, External.   Comments: Hemorrhoids, otherwise normal examination.  No polyps or cancers. Events  Unplanned Interventions: No intervention was required.  Unplanned Events: There were no complications. Plans Comments: She is at elevated risk for colorectal cancer given her family history and therefore needs screending colonsocopy  every 5 years.  We will arrange.  Will try miralax for her chronic constipation and she will return to see me in follow up. Disposition: After procedure patient sent to recovery. After recovery patient sent home.  Comments: Colonoscopy in 5 years.

## 2010-04-24 NOTE — Letter (Signed)
Summary: [facility]  [facility]   Imported By: Lanelle Bal 08/18/2009 11:55:34  _____________________________________________________________________  External Attachment:    Type:   Image     Comment:   External Document

## 2010-04-24 NOTE — Assessment & Plan Note (Signed)
Summary: ROA 3 [facility]THS F/U ON LABS CYD   Vital Signs:  Patient profile:   45 year old female Height:      65 inches Weight:      289.75 pounds BMI:     48.39 Temp:     98.7 degrees F oral Pulse rate:   84 / minute Pulse rhythm:   regular BP sitting:   138 / 76  (right arm) Cuff size:   large  Vitals Entered By: Lewanda Rife LPN (Aug 17, 2009 8:39 AM)  Serial Vital Signs/Assessments:  Time      Position  BP       Pulse  Resp  Temp     By                     122/80                         Judith Part MD  CC: three month f/u after labs   History of Present Illness: here for f/u of DM and lipids and HTN   wt is up 4 lb  bp is 138/76 on first check   DM worse with AIC 7.8 and random sugar of 200s  then was 9  gave her several insulin shots   had surgery 3 weeks ago on her L shoulder  preop sugar was 315 and HB was 7.4 unfortunately ended up doing the surgery -- ever since low sugar 2-300 very slowly coming down  is healing very well - PT 3 times per week   feels fine overall -- does not feel bad at all  diet is fine -- sticking with DM diet -- veg and lean protein exercise is not happening  is out 6 weeks  can walk - not really briskly    has not done anything about her anemia  is having some bleeding from hemorroid surgery -- pretty bad  needs f/u with her GI   lipids stable with LDL 65  Allergies: 1)  ! * Glucotrol 2)  ! Percocet 3)  Buspar  Past History:  Past Medical History: Last updated: 02/13/2008 Anemia-iron deficiency Asthma Depression Diabetes mellitus, type II obesity HTN  Colonoscopy- int hemorrhoids (07/1999) Admit- chest pain  Cardiac cath- neg,  chest CT- neg EF 65% (09/2004) Colonoscopy- ext hemorrhoids (12/2004)  Family History: Last updated: 04/27/2008 Father: colon cancer no DM in family  Social History: Last updated: 11/03/2007 Marital Status: Married Children: 2 Occupation: insurance Never Smoked walks for exercise    Risk Factors: Smoking Status: never (05/14/2007)  Past Surgical History: Cholecystectomy Hemorrhoidectomy Tubal ligation 4/11 shoulder surgery  Review of Systems General:  Complains of fatigue; denies fever, loss of appetite, and malaise. Eyes:  Denies blurring and eye pain. CV:  Denies chest pain or discomfort, lightheadness, and palpitations. Resp:  Complains of shortness of breath; denies cough, sputum productive, and wheezing. GI:  Denies abdominal pain, change in bowel habits, and nausea. GU:  Denies hematuria. MS:  Complains of joint pain and stiffness; denies joint redness, joint swelling, cramps, and muscle weakness. Derm:  Denies itching, lesion(s), poor wound healing, and rash. Neuro:  Denies numbness and tingling. Psych:  mood is ok . Endo:  Denies cold intolerance, excessive thirst, excessive urination, and heat intolerance. Heme:  Denies abnormal bruising and bleeding.  Physical Exam  General:  obese and well appearing  Head:  normocephalic, atraumatic, and no abnormalities observed.   Eyes:  vision grossly intact, pupils equal, pupils round, and pupils reactive to light.   Ears:  R ear normal and L ear normal.   Nose:  no nasal discharge.   Mouth:  pharynx pink and moist.   Neck:  supple with full rom and no masses or thyromegally, no JVD or carotid bruit  Chest Wall:  No deformities, masses, or tenderness noted. Lungs:  Normal respiratory effort, chest expands symmetrically. Lungs are clear to auscultation, no crackles or wheezes. Heart:  Normal rate and regular rhythm. S1 and S2 normal without gallop, murmur, click, rub or other extra sounds. Abdomen:  Bowel sounds positive,abdomen soft and non-tender without masses, organomegaly or hernias noted. no renal bruits  Msk:  No deformity or scoliosis noted of thoracic or lumbar spine.   Pulses:  R and L carotid,radial,femoral,dorsalis pedis and posterior tibial pulses are full and equal bilaterally Extremities:   No clubbing, cyanosis, edema, or deformity noted with normal full range of motion of all joints.   Neurologic:  sensation intact to light touch, gait normal, and DTRs symmetrical and normal.   Skin:  Intact without suspicious lesions or rashes Cervical Nodes:  No lymphadenopathy noted Inguinal Nodes:  No significant adenopathy Psych:  normal affect, talkative and pleasant   Diabetes Management Exam:    Foot Exam (with socks and/or shoes not present):       Sensory-Pinprick/Light touch:          Left medial foot (L-4): normal          Left dorsal foot (L-5): normal          Left lateral foot (S-1): normal          Right medial foot (L-4): normal          Right dorsal foot (L-5): normal          Right lateral foot (S-1): normal       Sensory-Monofilament:          Left foot: normal          Right foot: normal       Inspection:          Left foot: normal          Right foot: normal       Nails:          Left foot: normal          Right foot: normal   Impression & Recommendations:  Problem # 1:  HEMORRHOIDS, INTERNAL, WITH BLEEDING (ICD-455.2) Assessment Deteriorated per pt - very low hb at her shoulder surg (with fatigue)  states her int hem are bleeding badly wants GI ref to disc this and also colonosc  lab today will adv re: iron when it returns  Orders: Venipuncture (78938) TLB-CBC Platelet - w/Differential (85025-CBCD) TLB-IBC Pnl (Iron/FE;Transferrin) (83550-IBC) Gastroenterology Referral (GI)  Problem # 2:  HYPERTENSION (ICD-401.9) Assessment: Unchanged  bp better on second check - controlled with miacardis  disc slowly getting back to exercise Her updated medication list for this problem includes:    Micardis 40 Mg Tabs (Telmisartan) .Marland Kitchen... Take one by mouth daily  BP today: 138/76-- re check 122/80 Prior BP: 120/80 (05/04/2009)  Labs Reviewed: K+: 4.3 (08/14/2009) Creat: : 0.6 (08/14/2009)   Chol: 141 (08/14/2009)   HDL: 37.70 (08/14/2009)   LDL: 65  (08/14/2009)   TG: 193.0 (08/14/2009)  BP today: 138/76 Prior BP: 120/80 (05/04/2009)  Labs Reviewed: K+: 4.3 (08/14/2009) Creat: : 0.6 (08/14/2009)   Chol: 141 (  08/14/2009)   HDL: 37.70 (08/14/2009)   LDL: 65 (08/14/2009)   TG: 193.0 (08/14/2009)  Problem # 3:  HYPERLIPIDEMIA (ICD-272.2) Assessment: Unchanged  no sig change rev low sat fat diet LDL at goal  rev lab with pt today no change in med  Her updated medication list for this problem includes:    Zocor 20 Mg Tabs (Simvastatin) .Marland Kitchen... 1 by mouth once daily in evening  Labs Reviewed: SGOT: 24 (08/14/2009)   SGPT: 28 (08/14/2009)   HDL:37.70 (08/14/2009), 29.70 (01/03/2009)  LDL:65 (08/14/2009), 66 (01/03/2009)  Chol:141 (08/14/2009), 132 (01/03/2009)  Trig:193.0 (08/14/2009), 180.0 (01/03/2009)  Problem # 4:  DIABETES MELLITUS, TYPE II (ICD-250.00) Assessment: Deteriorated  this is gradually worsening - esp with recent surg and inability to exercise will gradually get back to exercise  trial of amaryl 2 mg- update (esp if low sugar)  f/u 2-3 mo  up to date opthy Her updated medication list for this problem includes:    Micardis 40 Mg Tabs (Telmisartan) .Marland Kitchen... Take one by mouth daily    Metformin Hcl 1000 Mg Tabs (Metformin hcl) .Marland Kitchen... Take 1 by mouth two times a day    Amaryl 2 Mg Tabs (Glimepiride) .Marland Kitchen... 1 by mouth once daily in am  Labs Reviewed: Creat: 0.6 (08/14/2009)     Last Eye Exam: normal (04/16/2009) Reviewed HgBA1c results: 7.8 (08/14/2009)  7.4 (03/31/2009)  Orders: Prescription Created Electronically [phone])  Problem # 5:  ANEMIA-IRON DEFICIENCY (ICD-280.9) Assessment: Deteriorated severe- perhaps from constantly bleed hem GI ref lab today and adv re: iron  Her updated medication list for this problem includes:    Ferrous Sulfate 325 (65 Fe) Mg Tbec (Ferrous sulfate) .Marland Kitchen... Take one by mouth daily  Orders: Venipuncture (57846) TLB-CBC Platelet - w/Differential (85025-CBCD) TLB-IBC Pnl  (Iron/FE;Transferrin) (83550-IBC) Gastroenterology Referral (GI)  Complete Medication List: 1)  Micardis 40 Mg Tabs (Telmisartan) .... Take one by mouth daily 2)  Zoloft 50 Mg Tabs (Sertraline hcl) .... Take one by mouth at bedtime 3)  Metformin Hcl 1000 Mg Tabs (Metformin hcl) .... Take 1 by mouth two times a day 4)  Ferrous Sulfate 325 (65 Fe) Mg Tbec (Ferrous sulfate) .... Take one by mouth daily 5)  Wellbutrin Xl 150 Mg Tb24 (Bupropion hcl) .Marland Kitchen.. 1 by mouth once daily 6)  Ambien 10 Mg Tabs (Zolpidem tartrate) .... 1/2 to 1 by mouth at bedtime prn 7)  Proair Hfa 108 (90 Base) Mcg/act Aers (Albuterol sulfate) .... 2 puffs q 4 hours as needed wheeze 8)  Diabetes Test Strips  .... To check sugar two times a day and as needed for dm 250.0 9)  Meclizine Hcl 25 Mg Tabs (Meclizine hcl) .Marland Kitchen.. 1 by mouth two times a day as needed vertigo 10)  Protonix 40 Mg Tbec (Pantoprazole sodium) .Marland Kitchen.. 1 by mouth once daily 11)  Fish Oil Oil (Fish oil) .... Take 2  tablets by mouth once a day 12)  One Daily Womens Tabs (Multiple vitamins-minerals) .... Take 1 tablet by mouth once a day 13)  Zocor 20 Mg Tabs (Simvastatin) .Marland Kitchen.. 1 by mouth once daily in evening 14)  Robaxin ?mg  .... Take one every 4-6 hours as needed 15)  Vicodin ?mg  .... Take one before pt after shoulder surgery 16)  Amaryl 2 Mg Tabs (Glimepiride) .Marland Kitchen.. 1 by mouth once daily in am  Patient Instructions: 1)  try to start walking as much as you can for exercise  2)  start amaryl - update me if any side  effects or low sugars  3)  stick to diabetic diet  4)  continue daily iron  5)  lab today  6)  we will refer you to GI at check out  7)  check sugar two times a day -- in am fasting and 2 hours after a meal  8)  follow up with me in 2-3 months to review your sugars  Prescriptions: AMARYL 2 MG TABS (GLIMEPIRIDE) 1 by mouth once daily in am  #30 x 5   Entered and Authorized by:   Judith Part MD   Signed by:   Judith Part MD on  08/17/2009   Method used:   Electronically to        [facility]  [address] [facility] [address] [id number] ([facility]ail)       [address]       [place], [place]  [address]       Ph: [phone]       Fax: [phone]   RxID:   [id number]   Current Allergies (reviewed today): ! * GLUCOTROL ! PERCOCET BUSPAR

## 2010-04-24 NOTE — Letter (Signed)
Summary: [facility] Instructions  [facility]  [address] [place], [place] [address]   Phone: [phone]  Fax: [phone]       [name]    [birth year]    MRN: [id number]        Procedure Day /Date: 12/04/09 MON     Arrival Time:130 pm     Procedure Time:230 pm     Location of Procedure:                    X  [facility] (4th Floor)   PREPARATION FOR COLONOSCOPY WITH MOVIPREP   Starting 5 days prior to your procedure 11/29/09 do not eat nuts, seeds, popcorn, corn, beans, peas,  salads, or any raw vegetables.  Do not take any fiber supplements (e.g. Metamucil, Citrucel, and Benefiber).  THE DAY BEFORE YOUR PROCEDURE         DATE: 12/03/09  DAY: SUN  1.  Drink clear liquids the entire day-NO SOLID FOOD  2.  Do not drink anything colored red or purple.  Avoid juices with pulp.  No orange juice.  3.  Drink at least 64 oz. (8 glasses) of fluid/clear liquids during the day to prevent dehydration and help the prep work efficiently.  CLEAR LIQUIDS INCLUDE: Water Jello Ice Popsicles Tea (sugar ok, no milk/cream) Powdered fruit flavored drinks Coffee (sugar ok, no milk/cream) Gatorade Juice: apple, white grape, white cranberry  Lemonade Clear bullion, consomm, broth Carbonated beverages (any kind) Strained chicken noodle soup Hard Candy                             4.  In the morning, mix first dose of MoviPrep solution:    Empty 1 Pouch A and 1 Pouch B into the disposable container    Add lukewarm drinking water to the top line of the container. Mix to dissolve    Refrigerate (mixed solution should be used within 24 hrs)  5.  Begin drinking the prep at 5:00 p.m. The MoviPrep container is divided by 4 marks.   Every 15 minutes drink the solution down to the next mark (approximately 8 oz) until the full liter is complete.   6.  Follow completed prep with 16 oz of clear liquid of your choice (Nothing red or purple).  Continue to drink  clear liquids until bedtime.  7.  Before going to bed, mix second dose of MoviPrep solution:    Empty 1 Pouch A and 1 Pouch B into the disposable container    Add lukewarm drinking water to the top line of the container. Mix to dissolve    Refrigerate  THE DAY OF YOUR PROCEDURE      DATE: 12/04/09 DAY:MON  Beginning at 930 a.m. (5 hours before procedure):         1. Every 15 minutes, drink the solution down to the next mark (approx 8 oz) until the full liter is complete.  2. Follow completed prep with 16 oz. of clear liquid of your choice.    3. You may drink clear liquids until 1230 pm (2 HOURS BEFORE PROCEDURE).   MEDICATION INSTRUCTIONS  Unless otherwise instructed, you should take regular prescription medications with a small sip of water   as early as possible the morning of your procedure.  Diabetic patients - see separate instructions.           OTHER INSTRUCTIONS  You will need  a responsible adult at least 45 years of age to accompany you and drive you home.   This person must remain in the waiting room during your procedure.  Wear loose fitting clothing that is easily removed.  Leave jewelry and other valuables at home.  However, you may wish to bring a book to read or  an iPod/MP3 player to listen to music as you wait for your procedure to start.  Remove all body piercing jewelry and leave at home.  Total time from sign-in until discharge is approximately 2-3 hours.  You should go home directly after your procedure and rest.  You can resume normal activities the  day after your procedure.  The day of your procedure you should not:   Drive   Make legal decisions   Operate machinery   Drink alcohol   Return to work  You will receive specific instructions about eating, activities and medications before you leave.    The above instructions have been reviewed and explained to me by   _______________________    I fully understand and can  verbalize these instructions _____________________________ Date _________

## 2010-04-24 NOTE — Procedures (Signed)
Summary: Upper Endoscopy  Patient: [name] Note: All result statuses are Final unless otherwise noted.  Tests: (1) Upper Endoscopy (EGD)   EGD Upper Endoscopy       DONE     [facility]     [address]     [place], [place]  [address]           ENDOSCOPY PROCEDURE REPORT           PATIENT:  [name], [name]  MR#:  [id number]     BIRTHDATE:  June 15, 1965, 44 yrs. old  GENDER:  female     ENDOSCOPIST:  Rachael Fee, MD     PROCEDURE DATE:  01/16/2010     PROCEDURE:  EGD with biopsy, 56433     ASA CLASS:  Class II     INDICATIONS:  chronic GERD symptoms, dyspepsia     MEDICATIONS:  There was residual sedation effect present from     prior procedure.     TOPICAL ANESTHETIC:  Exactacain Spray           DESCRIPTION OF PROCEDURE:   After the risks benefits and     alternatives of the procedure were thoroughly explained, informed     consent was obtained.  The LB [id number] [id number] endoscope was     introduced through the mouth and advanced to the second portion of     the duodenum, without limitations.  The instrument was slowly     withdrawn as the mucosa was fully examined.     <<PROCEDUREIMAGES>>     There was mild, non-specific gastritis that was biopsied to check     for H. pylori (pathology jar 2) (see image3).  Otherwise the     examination was normal (see image1, image2, and image4).     Retroflexed views revealed no abnormalities.    The scope was then     withdrawn from the patient and the procedure completed.     COMPLICATIONS:  None           ENDOSCOPIC IMPRESSION:     1) Mild gastritis, biopsies taken to check for H. pylori     2) Otherwise normal examination           RECOMMENDATIONS:     If biopsies show H. pylori, she will be started on the     appropriate antibiotics.     Continue daily protonix, best taken 20-30 min prior to breakfast     meal           ______________________________     Rachael Fee, MD           n.     eSIGNED:    Rachael Fee at 01/16/2010 10:19 AM           Encarnacion Chu, [id number]  Note: An exclamation mark (!) indicates a result that was not dispersed into the flowsheet. Document Creation Date: 01/16/2010 10:20 AM _______________________________________________________________________  (1) Order result status: Final Collection or observation date-time: 01/16/2010 10:04 Requested date-time:  Receipt date-time:  Reported date-time:  Referring Physician:   Ordering Physician: Rob Bunting [phone]) Specimen Source:  Source: Launa Grill Order Number: [id number] Lab site:

## 2010-04-24 NOTE — Letter (Signed)
Summary: Results Letter  [facility]  [address] [place], [place] [address]   Phone: [phone]  Fax: [phone]        January 18, 2010 MRN: [id number]    [name] [address] [place], [place]  [address]    Dear Ms. Mogg,   One of the polyps removed during your recent procedure was proven to be adenomatous.  These are pre-cancerous polyps that may have grown into cancers if they had not been removed.  Based on current nationally recognized surveillance guidelines, I recommend that you have a repeat colonoscopy in 5 years.  The biopsies taken during the upper endoscopy showed no sign of infection or cancer.  You should continue to follow the recommendations that we discussed at the time of your procedure.  We will therefore put your information in our reminder system and will contact you in 5 years to schedule a repeat colonoscopy.  Please call if you have any questions or concerns.       Sincerely,  Rachael Fee MD  This letter has been electronically signed by your physician.  Appended Document: Results Letter letter mailed 10.28.11

## 2010-04-24 NOTE — Progress Notes (Signed)
Summary: blood sugar elevated  Phone Note Call from Patient Call back at Home Phone [phone]   Caller: Patient Call For: Judith Part MD Summary of Call: Pt had shoulder surgery last week.  Her blood sugars have been way off since her pre op exam- at that time it was 315 fasting.  Since the surgery they have been from 179-287.  This morning it was 248.  She is asking if the stress from the surgery could be what is causing them to be up, or could the pain meds she is taking be causing it.  She is taking vicodin and robaxin right now.  She has an appt to see you on 5/26, with labs prior. Initial call taken by: Lowella Petties CMA,  Aug 01, 2009 9:17 AM  Follow-up for Phone Call        it could definitely be from the stress of surgery and pain (not med) stick to DM diet and I expect imp will disc further at f/u Follow-up by: Judith Part MD,  Aug 01, 2009 9:24 AM  Additional Follow-up for Phone Call Additional follow up Details #1::        lmom to call.          Lowella Petties CMA  Aug 01, 2009 9:58 AM  Advised pt. Additional Follow-up by: Lowella Petties CMA,  Aug 02, 2009 9:24 AM

## 2010-04-27 ENCOUNTER — Ambulatory Visit (INDEPENDENT_AMBULATORY_CARE_PROVIDER_SITE_OTHER): Payer: BC Managed Care – PPO | Admitting: Family Medicine

## 2010-04-27 ENCOUNTER — Encounter: Payer: Self-pay | Admitting: Family Medicine

## 2010-04-27 ENCOUNTER — Other Ambulatory Visit: Payer: Self-pay | Admitting: Family Medicine

## 2010-04-27 DIAGNOSIS — E782 Mixed hyperlipidemia: Secondary | ICD-10-CM

## 2010-04-27 DIAGNOSIS — D509 Iron deficiency anemia, unspecified: Secondary | ICD-10-CM

## 2010-04-27 DIAGNOSIS — J069 Acute upper respiratory infection, unspecified: Secondary | ICD-10-CM

## 2010-04-27 DIAGNOSIS — E119 Type 2 diabetes mellitus without complications: Secondary | ICD-10-CM

## 2010-04-27 DIAGNOSIS — R300 Dysuria: Secondary | ICD-10-CM

## 2010-04-27 DIAGNOSIS — I10 Essential (primary) hypertension: Secondary | ICD-10-CM

## 2010-04-27 LAB — RENAL FUNCTION PANEL
Calcium: 9.3 mg/dL (ref 8.4–10.5)
Chloride: 100 mEq/L (ref 96–112)
Creatinine, Ser: 0.6 mg/dL (ref 0.4–1.2)
GFR: 127.33 mL/min (ref >60.00)
Phosphorus: 3.6 mg/dL (ref 2.3–4.6)
Potassium: 4.7 mEq/L (ref 3.5–5.1)

## 2010-04-27 LAB — HEPATIC FUNCTION PANEL
ALT: 26 U/L (ref 0–35)
Albumin: 3.5 g/dL (ref 3.5–5.2)
Alkaline Phosphatase: 77 U/L (ref 39–117)
Bilirubin, Direct: 0.1 mg/dL (ref 0.0–0.3)
Total Bilirubin: 0.5 mg/dL (ref 0.3–1.2)

## 2010-04-27 LAB — LIPID PANEL
Cholesterol: 159 mg/dL (ref 0–200)
HDL: 40.9 mg/dL (ref >39.00)
Triglycerides: 114 mg/dL (ref 0.0–149.0)

## 2010-04-27 LAB — CBC WITH DIFFERENTIAL/PLATELET
Basophils Absolute: 0 10*3/uL (ref 0.0–0.1)
Eosinophils Absolute: 0.3 10*3/uL (ref 0.0–0.7)
Eosinophils Relative: 3.5 % (ref 0.0–5.0)
HCT: 38.1 % (ref 36.0–46.0)
Hemoglobin: 12.6 g/dL (ref 12.0–15.0)
Lymphocytes Relative: 15.8 % (ref 12.0–46.0)
MCV: 86.1 fl (ref 78.0–100.0)
Monocytes Absolute: 0.4 10*3/uL (ref 0.1–1.0)
Neutro Abs: 6.5 10*3/uL (ref 1.4–7.7)
WBC: 8.5 10*3/uL (ref 4.5–10.5)

## 2010-04-27 LAB — CONVERTED CEMR LAB
Glucose, Urine, Semiquant: 500
Protein, U semiquant: 100
pH: 6

## 2010-04-27 LAB — TSH: TSH: 2.9 u[IU]/mL (ref 0.35–5.50)

## 2010-04-27 NOTE — Consult Note (Signed)
Summary: [facility]   Imported By: Lanelle Bal 02/14/2010 10:21:59  _____________________________________________________________________  External Attachment:    Type:   Image     Comment:   External Document

## 2010-04-28 ENCOUNTER — Encounter: Payer: Self-pay | Admitting: Family Medicine

## 2010-05-07 ENCOUNTER — Encounter: Payer: Self-pay | Admitting: Family Medicine

## 2010-05-10 NOTE — Assessment & Plan Note (Signed)
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)  is coughing and / wheezing/ sneezing /  some mild phlegm- no color nose stuffy and tired   in workup for iron def anemia -- had been taking iron  colon polyp and gastritis found -- on protonix  int bleeding hem found -- went to surgeon / decided against surgery -- doing banding  this has helped  given px for iron   DM last AIc high over 8 with stress on metformin and amaryl now  is generally running in 300s  not feeling good and stress  does follow low carb diet but fudges with bread and macaroni  has not exercised due to illness  daughter getting married in june and taking care of mom too  lots of stress   HTN - bp first check 154/90 today- woke up late and rushing   on zocor for lipids Last Lipid ProfileCholesterol: 141 (08/14/2009 9:02:14 AM)HDL:  37.70 (08/14/2009 9:02:14 AM)LDL:  65 (08/14/2009 9:02:14 AM)Triglycerides:  Last Liver profileSGOT:  24 (08/14/2009 9:02:14 AM)SPGT:  28 (08/14/2009 9:02:14 AM)T. Bili:  Alk Phos:    due for check   wt up 5 lb with bmi of 49  has truely tried low calorie diets and not lost wt  ? wonders about wt loss surgery as option   occ burns to urinate - wants to check sample today ? if drinks enough water no fever or nausea some frequency    Allergies: 1)  ! * Glucotrol 2)  ! Percocet 3)  Buspar  Review of Systems General:  Complains of fatigue;  denies loss of appetite. Eyes:  Denies blurring and eye irritation. ENT:  Complains of hoarseness, nasal congestion, postnasal drainage, and sore throat. CV:  Denies chest pain or discomfort and palpitations. Resp:  Complains of cough; denies shortness of breath and wheezing. GI:  Denies abdominal pain and change in bowel habits. GU:  Complains of dysuria; denies hematuria and urinary frequency. MS:  Denies joint pain. Derm:  Denies itching, lesion(s), poor wound healing, and rash. Neuro:  Denies numbness and tingling. Psych:  very stressed . Endo:  Denies excessive thirst and excessive urination. Heme:  Denies abnormal bruising and bleeding.  Physical Exam  General:  morbidly obese and fatigued appearing  Head:  normocephalic, atraumatic, and no abnormalities observed.  no sinus tenderness Eyes:  vision grossly intact, pupils equal, pupils round, and pupils reactive to light.  no conjunctival pallor, injection or icterus  Ears:  R ear normal and L ear normal.   Nose:  nares are injected and congested bilaterally  Mouth:  pharynx pink and moist, no erythema, and no exudates.   Neck:  supple with full rom and no masses or thyromegally, no  JVD or carotid bruit  Chest Wall:  No deformities, masses, or tenderness noted. Lungs:  harsh bs at bases no rales/ rhonchi or wheeze Heart:  Normal rate and regular rhythm. S1 and S2 normal without gallop, murmur, click, rub or other extra sounds. Abdomen:  Bowel sounds positive,abdomen soft and non-tender without masses, organomegaly or hernias noted. no renal bruits  Msk:  No deformity or scoliosis noted of thoracic or lumbar spine.   Pulses:  R and L carotid,radial,femoral,dorsalis pedis and posterior tibial pulses are full and equal bilaterally Extremities:  trace left pedal edema.  trace left pedal edema.   Neurologic:  sensation intact to light touch, gait normal, and DTRs symmetrical and normal.  sensation intact to light touch, gait normal,  and DTRs symmetrical and normal.   Skin:  Intact without suspicious lesions or rashes Cervical Nodes:  No lymphadenopathy noted Inguinal Nodes:  No significant adenopathy Psych:  normal affect, talkative and pleasant   Diabetes Management Exam:    Foot Exam (with socks and/or shoes not present):       Sensory-Pinprick/Light touch:          Left medial foot (L-4): normal          Left dorsal foot (L-5): normal          Left lateral foot (S-1): normal          Right medial foot (L-4): normal          Right dorsal foot (L-5): normal          Right lateral foot (S-1): normal       Sensory-Monofilament:          Left foot: normal          Right foot: normal       Inspection:          Left foot: normal          Right foot: normal       Nails:          Left foot: normal          Right foot: normal   Impression & Recommendations:  Problem # 1:  OBESITY, MORBID (ICD-278.01) Assessment Comment Only disc surgical options as she tells me she that even with low calorie diet and exercise and good discipline she still gains wt  will refer for info session on bariatric surgery and speak with her afterwards  disc healthy diet (low simple sugar/ choose complex carbs/ low sat fat) diet and exercise in detail  it is obvious that many of her health problems would improve or resolve with wt loss  Orders: Surgical Referral (Surgery)  Problem # 2:  URI (ICD-465.9) Assessment: Deteriorated this is recurrent- with some self reported reactive airways recommend inhaler as needed and take course of zithromax -- and udate  infx could be perpetuating high sugars  The following medications were removed from the medication list:    Celebrex 200 Mg Caps (Celecoxib) .Marland Kitchen... Take 1 tablet by mouth once a day  Problem # 3:  HYPERTENSION (ICD-401.9) Assessment: Deteriorated  bp up -- pt is stressed and rushing  re check at f/u soon and adj med as needed  Her updated medication list for this problem  includes:    Micardis 40 Mg Tabs (Telmisartan) .Marland Kitchen... Take one by mouth daily  Orders: Venipuncture (78469) TLB-Lipid Panel (80061-LIPID) TLB-Renal Function Panel (80069-RENAL) TLB-CBC Platelet - w/Differential (85025-CBCD) TLB-Hepatic/Liver Function Pnl (80076-HEPATIC) TLB-TSH (Thyroid Stimulating Hormone) (84443-TSH)  BP today: 154/80 Prior BP: 130/80 (01/12/2010)  Labs Reviewed: K+: 4.3 (08/14/2009) Creat: : 0.6 (08/14/2009)   Chol: 141 (08/14/2009)   HDL: 37.70 (08/14/2009)   LDL: 65 (08/14/2009)   TG: 193.0 (08/14/2009)  Problem # 4:  HYPERLIPIDEMIA (ICD-272.2) Assessment: Unchanged  this has been fairly controlled  rev low sat fat diet  Her updated medication list for this problem includes:    Zocor 20 Mg Tabs (Simvastatin) .Marland Kitchen... 1 by mouth once daily in evening  Orders: Venipuncture (16109) TLB-Lipid Panel (80061-LIPID) TLB-Renal Function Panel (80069-RENAL) TLB-CBC Platelet - w/Differential (85025-CBCD) TLB-Hepatic/Liver Function Pnl (80076-HEPATIC) TLB-TSH (Thyroid Stimulating Hormone) (84443-TSH)  Labs Reviewed: SGOT: 24 (08/14/2009)   SGPT: 28 (08/14/2009)   HDL:37.70 (08/14/2009), 29.70 (01/03/2009)  LDL:65 (08/14/2009), 66 (01/03/2009)  Chol:141 (08/14/2009), 132 (01/03/2009)  Trig:193.0 (08/14/2009), 180.0 (01/03/2009)  Problem # 5:  DYSURIA (ICD-788.1) Assessment: New  some wbc on ua along with sugar will cx that and update adv to inc water intake   Her updated medication list for this problem includes:    Zithromax Z-pak 250 Mg Tabs (Azithromycin) .Marland Kitchen... Take by mouth as directed for bronchitis  Orders: UA Dipstick W/ Micro (manual) (60454) T-Culture, Urine (09811-91478) Specimen Handling (99000)  Problem # 6:  DIABETES MELLITUS, TYPE II (ICD-250.00) Assessment: Deteriorated this is worse likely given self report -- and wt gain  lab today will review her ins covarage- ? if candidate for januvia or lantus  will update  wt loss need disc  Her  updated medication list for this problem includes:    Micardis 40 Mg Tabs (Telmisartan) .Marland Kitchen... Take one by mouth daily    Metformin Hcl 1000 Mg Tabs (Metformin hcl) .Marland Kitchen... Take 1 by mouth two times a day    Amaryl 4 Mg Tabs (Glimepiride) .Marland Kitchen... 1 by mouth once daily  Orders: Venipuncture (29562) TLB-Lipid Panel (80061-LIPID) TLB-Renal Function Panel (80069-RENAL) TLB-CBC Platelet - w/Differential (85025-CBCD) TLB-Hepatic/Liver Function Pnl (80076-HEPATIC) TLB-TSH (Thyroid Stimulating Hormone) (84443-TSH)  Problem # 7:  ANEMIA-IRON DEFICIENCY (ICD-280.9) Assessment: Improved  this is improved -continue iron and tx for bleeding hemorroids re check cbc Her updated medication list for this problem includes:    Ferrous Sulfate 325 (65 Fe) Mg Tbec (Ferrous sulfate) .Marland Kitchen... Take one by mouth two times a day.  Orders: Venipuncture (13086) TLB-Lipid Panel (80061-LIPID) TLB-Renal Function Panel (80069-RENAL) TLB-CBC Platelet - w/Differential (85025-CBCD) TLB-Hepatic/Liver Function Pnl (80076-HEPATIC) TLB-TSH (Thyroid Stimulating Hormone) (84443-TSH)  Hgb: 11.0 (01/12/2010)   Hct: 33.0 (01/12/2010)   Platelets: 292.0 (01/12/2010) RBC: 3.84 (01/12/2010)   RDW: 17.1 (01/12/2010)   WBC: 8.8 (01/12/2010) MCV: 85.9 (01/12/2010)   MCHC: 33.2 (01/12/2010) Ferritin: 8.8 (06/17/2006) Iron: 59 (08/17/2009)   % Sat: 14.8 (08/17/2009) TSH: 2.86 (04/20/2008)  Complete Medication List: 1)  Micardis 40 Mg Tabs (Telmisartan) .... Take one by mouth daily 2)  Zoloft 50 Mg Tabs (Sertraline hcl) .... Take one by mouth at bedtime 3)  Metformin Hcl 1000 Mg Tabs (Metformin hcl) .... Take 1 by mouth two times a day 4)  Ferrous Sulfate 325 (65 Fe) Mg Tbec (Ferrous sulfate) .... Take one by mouth two times a day. 5)  Wellbutrin Xl 150 Mg Tb24 (Bupropion hcl) .Marland Kitchen.. 1 by mouth once daily 6)  Ambien 10 Mg Tabs (Zolpidem tartrate) .... 1/2 to 1 by mouth at bedtime as needed 7)  Proair Hfa 108 (90 Base) Mcg/act Aers  (Albuterol sulfate) .... 2 puffs q 4 hours as needed wheeze 8)  Diabetes Test Strips  .... To check sugar two times  a day and as needed for dm 250.0 9)  Meclizine Hcl 25 Mg Tabs (Meclizine hcl) .Marland Kitchen.. 1 by mouth two times a day as needed vertigo 10)  Protonix 40 Mg Tbec (Pantoprazole sodium) .Marland Kitchen.. 1 by mouth once daily 11)  Fish Oil Oil (Fish oil) .... Take 2  tablets by mouth once a day 12)  One Daily Womens Tabs (Multiple vitamins-minerals) .... Take 1 tablet by mouth once a day 13)  Zocor 20 Mg Tabs (Simvastatin) .Marland Kitchen.. 1 by mouth once daily in evening 14)  Amaryl 4 Mg Tabs (Glimepiride) .Marland Kitchen.. 1 by mouth once daily 15)  Zithromax Z-pak 250 Mg Tabs (Azithromycin) .... Take by mouth as directed for bronchitis 16)  Diflucan 150 Mg Tabs (Fluconazole) .Marland Kitchen.. 1 by mouth times one for yeast  hold zocor for 3 days with this  Patient Instructions: 1)  labs today 2)  follow up in 2 weeks  3)  no change in medicines 4)  leave urine for anaylsis on way out  5)  take diflucan for yeast  6)  take zithromax for bronchitis 7)  use inhaler as needed  8)  call your insurance and get full list of meds covered for diabetes  9)  we will do referral for weight loss surgery info session  Prescriptions: ZOCOR 20 MG TABS (SIMVASTATIN) 1 by mouth once daily in evening  #30 x 11   Entered and Authorized by:   Judith Part MD   Signed by:   Judith Part MD on 04/27/2010   Method used:   Electronically to        [id number] (retail)       [address]       [place], [place]  [address]       Ph: [phone]       Fax: [phone]   RxID:   [id number] METFORMIN HCL 1000 MG  TABS (METFORMIN HCL) take 1 by mouth two times a day  #60 x 11   Entered and Authorized by:   Judith Part MD   Signed by:   Judith Part MD on 04/27/2010   Method used:   Electronically to        [id number] (retail)       [address]       [place], [place]  [address]       Ph: [phone]       Fax: [phone]   RxID:   [id number] ZOLOFT 50 MG  TABS (SERTRALINE HCL) take one by mouth at bedtime  #30 x 11   Entered and Authorized by:   Judith Part MD   Signed by:   Judith Part MD on 04/27/2010   Method used:   Electronically to        [id number] ([facility]ail)       [address]       [place], [place]  [address]       Ph: [phone]       Fax: [phone]   RxID:   [id number] MICARDIS 40 MG  TABS (TELMISARTAN) take one by mouth daily  #30 x 11   Entered and Authorized by:   Judith Part MD   Signed by:   Colon Flattery  Irisha Grandmaison MD on 04/27/2010   Method used:   Electronically to        [id number] ([facility]ail)       [address]       [place], [place]  [address]       Ph: [phone]       Fax: [phone]   RxID:   [id number] DIFLUCAN 150 MG TABS (FLUCONAZOLE) 1 by mouth times one for yeast  hold zocor for 3 days with this  #1 x 0   Entered and Authorized by:   Judith Part MD   Signed by:   Judith Part MD on 04/27/2010   Method used:   Electronically to        [id number] ([facility])       [address]       [place], [place]  [address]       Ph: [phone]       Fax: [phone]   RxID:   [id number] ZITHROMAX Z-PAK 250 MG TABS (AZITHROMYCIN) take by mouth as directed for bronchitis  #1 pack x 0   Entered and Authorized by:   Judith Part MD   Signed by:   Judith Part MD on 04/27/2010   Method used:   Electronically to        [id number] ([facility]       [address]       [place], [place]  [address]       Ph: [phone]       Fax: [phone]   RxID:   [id number]    Orders Added: 1)  Surgical Referral [Surgery] 2)  Venipuncture [62694] 3)  TLB-Lipid Panel [80061-LIPID] 4)  TLB-Renal Function Panel [80069-RENAL] 5)   TLB-CBC Platelet - w/Differential [85025-CBCD] 6)  TLB-Hepatic/Liver Function Pnl [80076-HEPATIC] 7)  TLB-TSH (Thyroid Stimulating Hormone) [84443-TSH] 8)  UA Dipstick W/ Micro (manual) [81000] 9)  T-Culture, Urine [85462-70350] 10)  Specimen Handling [99000] 11)  Est. Patient Level V [09381]    Current Allergies (reviewed today): ! * GLUCOTROL ! PERCOCET BUSPAR Laboratory Results   Urine Tests  Date/Time Received: April 27, 2010 10:25 AM  Date/Time Reported: April 27, 2010 10:25 AM   Routine Urinalysis   Color: yellow Appearance: Hazy Glucose: 500   (Normal Range: Negative) Bilirubin: negative   (Normal Range: Negative) Ketone: small (15)   (Normal Range: Negative) Spec. Gravity: 1.020   (Normal Range: 1.003-1.035) Blood: small   (Normal Range: Negative) pH: 6.0   (Normal Range: 5.0-8.0) Protein: 100   (Normal Range: Negative) Urobilinogen: 0.2   (Normal Range: 0-1) Nitrite: negative   (Normal Range: Negative) Leukocyte Esterace: negative   (Normal Range: Negative)  Urine Microscopic WBC/HPF: 3-4 RBC/HPF: 2-3 Bacteria/HPF: mild Mucous/HPF: few Epithelial/HPF: 2-3 Crystals/HPF: few Casts/LPF: 0 Yeast/HPF: 0 Other: 0         Laboratory Results   Urine Tests    Routine Urinalysis   Color: yellow Appearance: Hazy Glucose: 500   (Normal Range: Negative) Bilirubin: negative   (Normal Range: Negative) Ketone: small (15)   (Normal Range: Negative) Spec. Gravity: 1.020   (Normal Range: 1.003-1.035) Blood: small   (Normal Range: Negative) pH: 6.0   (Normal Range: 5.0-8.0) Protein: 100   (Normal Range: Negative) Urobilinogen: 0.2   (  Normal Range: 0-1) Nitrite: negative   (Normal Range: Negative) Leukocyte Esterace: negative   (Normal Range: Negative)  Urine Microscopic WBC/hpf: 3-4 RBC/hpf: 2-3 Bacteria: mild Mucous: few Epithelial: 2-3 Crystals/LPF: few Casts/LPF: 0 Yeast/HPF: 0 Other: 0

## 2010-05-11 ENCOUNTER — Ambulatory Visit: Payer: BC Managed Care – PPO | Admitting: Family Medicine

## 2010-05-17 ENCOUNTER — Ambulatory Visit: Payer: BC Managed Care – PPO | Admitting: Family Medicine

## 2010-05-21 ENCOUNTER — Encounter: Payer: Self-pay | Admitting: Family Medicine

## 2010-05-21 ENCOUNTER — Ambulatory Visit (INDEPENDENT_AMBULATORY_CARE_PROVIDER_SITE_OTHER): Payer: BC Managed Care – PPO | Admitting: Family Medicine

## 2010-05-21 DIAGNOSIS — B373 Candidiasis of vulva and vagina: Secondary | ICD-10-CM

## 2010-05-21 DIAGNOSIS — E782 Mixed hyperlipidemia: Secondary | ICD-10-CM

## 2010-05-21 DIAGNOSIS — I10 Essential (primary) hypertension: Secondary | ICD-10-CM

## 2010-05-21 DIAGNOSIS — E119 Type 2 diabetes mellitus without complications: Secondary | ICD-10-CM

## 2010-05-23 ENCOUNTER — Telehealth: Payer: Self-pay | Admitting: Family Medicine

## 2010-05-30 ENCOUNTER — Telehealth: Payer: Self-pay | Admitting: Family Medicine

## 2010-05-30 ENCOUNTER — Encounter: Payer: Self-pay | Admitting: Family Medicine

## 2010-05-31 NOTE — Assessment & Plan Note (Signed)
Summary: F/U   Vital Signs:  Patient profile:   45 year old female Height:      65 inches Weight:      289.75 pounds BMI:     48.39 Temp:     98.7 degrees F oral Pulse rate:   88 / minute Pulse rhythm:   regular BP sitting:   144 / 96  (left arm) Cuff size:   large  Vitals Entered By: Lewanda Rife LPN (May 21, 2010 4:04 PM)  CC: follow-up visit   History of Present Illness: here for f/u of HTN and DM  wt is  down 8 lb with bmi of 48 had disc ref to bariatric surgery info session very informative and learned a lot -- got insurance company info together  is really leaning toward this - ? which surgery unsure yet   wt loss poss from high sugar   no regular exercise  is avoiding red meat and fried foods  is avoiding starches  1 sweet tea or coke per day -- tried to quit and got a headache    bp still high at 144/96  lipids last fair Last Lipid ProfileCholesterol: 159 (04/27/2010 9:47:10 AM)HDL:  40.90 (04/27/2010 9:47:10 AM)LDL:  95 (04/27/2010 9:47:10 AM)Triglycerides:  Last Liver profileSGOT:  25 (04/27/2010 9:47:10 AM)SPGT:  26 (04/27/2010 9:47:10 AM)T. Bili:  0.5 (04/27/2010 9:47:10 AM)Alk Phos:  77 (04/27/2010 9:47:10 AM)   gluc was 238 with AIC over 8 is on metformin and amaryl  opthy she wants to schedule herself no DM symptoms   was going to check on cost of other meds  brought med list with her  looks like lantus is possible 240s- low 300s   has vaginal burning and itching -some d/c when she wiped  using monistat over the counter - 2 wkends before - still using cream now thinks she may need diflucan keeps yeast infx with the DM   Allergies: 1)  ! * Glucotrol 2)  ! Percocet 3)  Buspar  Past History:  Past Medical History: Last updated: 02/13/2008 Anemia-iron deficiency Asthma Depression Diabetes mellitus, type II obesity HTN  Colonoscopy- int hemorrhoids (07/1999) Admit- chest pain  Cardiac cath- neg,  chest CT- neg EF 65%  (09/2004) Colonoscopy- ext hemorrhoids (12/2004)  Past Surgical History: Last updated: 08/17/2009 Cholecystectomy Hemorrhoidectomy Tubal ligation 4/11 shoulder surgery  Family History: Last updated: 04/27/2008 Father: colon cancer no DM in family  Social History: Last updated: 11/03/2007 Marital Status: Married Children: 2 Occupation: insurance Never Smoked walks for exercise   Risk Factors: Smoking Status: never (05/14/2007)  Review of Systems General:  Complains of fatigue; denies loss of appetite and malaise. Eyes:  Denies blurring and eye irritation. CV:  Denies chest pain or discomfort, lightheadness, palpitations, shortness of breath with exertion, and swelling of feet. Resp:  Denies cough and shortness of breath. GI:  Denies abdominal pain, change in bowel habits, and nausea. GU:  Complains of discharge; denies abnormal vaginal bleeding and dysuria. MS:  Complains of joint pain and stiffness; denies joint redness and joint swelling. Derm:  Complains of itching; denies lesion(s), poor wound healing, and rash. Neuro:  Denies numbness and tingling. Endo:  Denies cold intolerance, excessive thirst, excessive urination, and heat intolerance. Heme:  Denies abnormal bruising and bleeding.  Physical Exam  General:  morbidly obese and well appearing  Head:  normocephalic, atraumatic, and no abnormalities observed.   Eyes:  vision grossly intact, pupils equal, pupils round, and pupils reactive to light.  no  conjunctival pallor, injection or icterus  Mouth:  pharynx pink and moist.   Neck:  supple with full rom and no masses or thyromegally, no JVD or carotid bruit  Chest Wall:  No deformities, masses, or tenderness noted. Lungs:  harsh bs at bases no rales/ rhonchi or wheeze Heart:  Normal rate and regular rhythm. S1 and S2 normal without gallop, murmur, click, rub or other extra sounds. Abdomen:  Bowel sounds positive,abdomen soft and non-tender without masses,  organomegaly or hernias noted. no renal bruits  Msk:  No deformity or scoliosis noted of thoracic or lumbar spine.   Pulses:  R and L carotid,radial,femoral,dorsalis pedis and posterior tibial pulses are full and equal bilaterally Extremities:  trace left pedal edema.  trace left pedal edema.   Neurologic:  sensation intact to light touch, gait normal, and DTRs symmetrical and normal.  sensation intact to light touch, gait normal, and DTRs symmetrical and normal.   Skin:  Intact without suspicious lesions or rashes Cervical Nodes:  No lymphadenopathy noted Inguinal Nodes:  No significant adenopathy Psych:  normal affect, talkative and pleasant   Diabetes Management Exam:    Foot Exam (with socks and/or shoes not present):       Sensory-Pinprick/Light touch:          Left medial foot (L-4): normal          Left dorsal foot (L-5): normal          Left lateral foot (S-1): normal          Right medial foot (L-4): normal          Right dorsal foot (L-5): normal          Right lateral foot (S-1): normal       Sensory-Monofilament:          Left foot: normal          Right foot: normal       Inspection:          Left foot: normal          Right foot: normal       Nails:          Left foot: normal          Right foot: normal   Impression & Recommendations:  Problem # 1:  VAGINITIS, CANDIDAL (ICD-112.1) Assessment New  will not do wet prep today in light of concurrent use of monitsat trial of diflucan and update chronic yeast infx are problem with her DM The following medications were removed from the medication list:    Diflucan 150 Mg Tabs (Fluconazole) .Marland Kitchen... 1 by mouth times one for yeast  hold zocor for 3 days with this Her updated medication list for this problem includes:    Diflucan 150 Mg Tabs (Fluconazole) .Marland Kitchen... Take one by mouth now and repeat in 3 days  Orders: Prescription Created Electronically [phone])  Problem # 2:  HYPERTENSION (ICD-401.9) Assessment:  Deteriorated  bp is not well controlled  inc micardis to 80 and then f/u  update if side eff rev lifestyle habits  Her updated medication list for this problem includes:    Micardis 80 Mg Tabs (Telmisartan) .Marland Kitchen... 1 by mouth once daily  BP today: 144/96 Prior BP: 154/80 (04/27/2010)  Labs Reviewed: K+: 4.7 (04/27/2010) Creat: : 0.6 (04/27/2010)   Chol: 159 (04/27/2010)   HDL: 40.90 (04/27/2010)   LDL: 95 (04/27/2010)   TG: 114.0 (04/27/2010)  Orders: Prescription Created Electronically [phone])  Problem #  3:  DIABETES MELLITUS, TYPE II (ICD-250.00) Assessment: Deteriorated  not well controlled add lantus - start at 10 u and can inc by 10 u intervals weekly until goals are met disc goals for fasting and pp sugars will check two times a day and send frequent reports so we can titrate  again rev low glycemic diet and need for wt loss Her updated medication list for this problem includes:    Micardis 80 Mg Tabs (Telmisartan) .Marland Kitchen... 1 by mouth once daily    Metformin Hcl 1000 Mg Tabs (Metformin hcl) .Marland Kitchen... Take 1 by mouth two times a day    Amaryl 4 Mg Tabs (Glimepiride) .Marland Kitchen... 1 by mouth once daily    Lantus Solostar 100 Unit/ml Soln (Insulin glargine) ..... Inject 10 units once daily as directed  Labs Reviewed: Creat: 0.6 (04/27/2010)     Last Eye Exam: normal (04/16/2009) Reviewed HgBA1c results: 8.3 (01/12/2010)  7.8 (08/14/2009)  Orders: Prescription Created Electronically [phone])  Problem # 4:  OBESITY, MORBID (ICD-278.01) Assessment: Unchanged long disc about pros/cons/ risks of wt loss surgery  pt considering strongly given inability to loose wt with diet and exercise and also comorbidities -numerous will rev paperwork and submit list of st loss efforts   Complete Medication List: 1)  Micardis 80 Mg Tabs (Telmisartan) .Marland Kitchen.. 1 by mouth once daily 2)  Zoloft 50 Mg Tabs (Sertraline hcl) .... Take one by mouth at bedtime 3)  Metformin Hcl 1000 Mg Tabs (Metformin hcl) ....  Take 1 by mouth two times a day 4)  Ferrous Sulfate 325 (65 Fe) Mg Tbec (Ferrous sulfate) .... Take one by mouth two times a day. 5)  Wellbutrin Xl 150 Mg Tb24 (Bupropion hcl) .Marland Kitchen.. 1 by mouth once daily 6)  Ambien 10 Mg Tabs (Zolpidem tartrate) .... 1/2 to 1 by mouth at bedtime as needed 7)  Proair Hfa 108 (90 Base) Mcg/act Aers (Albuterol sulfate) .... 2 puffs q 4 hours as needed wheeze 8)  Diabetes Test Strips  .... To check sugar two times a day and as needed for dm 250.0 9)  Meclizine Hcl 25 Mg Tabs (Meclizine hcl) .Marland Kitchen.. 1 by mouth two times a day as needed vertigo 10)  Protonix 40 Mg Tbec (Pantoprazole sodium) .Marland Kitchen.. 1 by mouth once daily 11)  Fish Oil Oil (Fish oil) .... Take 2  tablets by mouth once a day 12)  One Daily Womens Tabs (Multiple vitamins-minerals) .... Take 1 tablet by mouth once a day 13)  Zocor 20 Mg Tabs (Simvastatin) .Marland Kitchen.. 1 by mouth once daily in evening 14)  Amaryl 4 Mg Tabs (Glimepiride) .Marland Kitchen.. 1 by mouth once daily 15)  Lantus Solostar 100 Unit/ml Soln (Insulin glargine) .... Inject 10 units once daily as directed 16)  Diflucan 150 Mg Tabs (Fluconazole) .... Take one by mouth now and repeat in 3 days  Patient Instructions: 1)  increase miacardis to 80 mg (total ) once daily -- (pills you have left 2 once per day)- new px 1 pill per day 2)  get your diet /exercise record filled out for me and drop it off  3)  our fax is [phone] 4)  keep working on diet and exercise best you can  5)  check sugars two times a day --am fasting and 2 hours after evening meal  6)  start lantus 10 units once daily in evening 7)  send me sugar log in 1-2 weeks (after starting it )- then we will gradually increase dose  8)  schedule fasting labs in 3 months lipid/ast/alt 272 and AIC 250.0 and renal 250.0 and then follow up  9)  take diflucan today and repeat dose in 3 days  10)  hold zocor until done with that  Prescriptions: PROTONIX 40 MG TBEC (PANTOPRAZOLE SODIUM) 1 by mouth once daily   #30 x 11   Entered and Authorized by:   Judith Part MD   Signed by:   Judith Part MD on 05/25/2010   Method used:   Telephoned to ...       [id number]       [address]       [place], [place]  [address]       Ph: [phone]       Fax: [phone]   RxID:   [id number] WELLBUTRIN XL 150 MG  TB24 (BUPROPION HCL) 1 by mouth once daily  #30 x 11   Entered and Authorized by:   Judith Part MD   Signed by:   Judith Part MD on 05/25/2010   Method used:   Telephoned to ...       [id number]       [address]       [place], [place]  [address]       Ph: [phone]       Fax: [phone]   RxID:   [id number] DIFLUCAN 150 MG TABS (FLUCONAZOLE) take one by mouth now and repeat in 3 days  #2 x 0   Entered and Authorized by:   Judith Part MD   Signed by:   Judith Part MD on 05/21/2010   Method used:   Electronically to        [id number]       [address]       [place], [place]  [address]       Ph: [phone]       Fax: [phone]   RxID:   [id number] LANTUS SOLOSTAR 100 UNIT/ML SOLN (INSULIN GLARGINE) inject 10 units once daily as directed  #1 month x 11   Entered and Authorized by:   Judith Part MD   Signed by:   Judith Part MD on 05/21/2010   Method used:   Electronically to        [id number]       [address]       [place], [place]  [address]       Ph: [phone]       Fax: [phone]   RxID:   [id number] MICARDIS 80 MG TABS (TELMISARTAN) 1 by mouth once daily  #30 x 11   Entered and Authorized by:   Judith Part MD   Signed by:   Judith Part MD on 05/21/2010   Method used:   Electronically to        [id number]       [address]       [place], [place]  [address]       Ph:  [phone]       Fax:  [phone]   RxID:   [id number]    Orders Added: 1)  Prescription Created Electronically [[id number]] 2)  Est. Patient Level V [01027]    Current Allergies (reviewed today): ! * GLUCOTROL ! PERCOCET BUSPAR

## 2010-05-31 NOTE — Progress Notes (Signed)
Summary: Bupropion XL 150mg   and Pantoprazole 40mg   Phone Note Refill Request Call back at [phone] Message from:  [facility] on May 23, 2010 4:57 PM  Refills Requested: Medication #1:  WELLBUTRIN XL 150 MG  TB24 1 by mouth once daily   Last Refilled: 04/16/2010  Medication #2:  PROTONIX 40 MG TBEC 1 by mouth once daily   Last Refilled: 04/16/2010 [facility] request refill electronically on Bupropion HCL XL 150mg  and Pantoprazole 40mg  both last refilled 04/16/10.   Method Requested: Electronic Initial call taken by: Lewanda Rife LPN,  May 23, 2010 5:01 PM  Follow-up for Phone Call        px written on EMR for call in  they are done in her last office visit since that is still open Follow-up by: Judith Part MD,  May 23, 2010 5:20 PM  Additional Follow-up for Phone Call Additional follow up Details #1::        Medications phone to [facility] as instructed.Lewanda Rife LPN  May 23, 2010 5:35 PM

## 2010-06-05 ENCOUNTER — Encounter (INDEPENDENT_AMBULATORY_CARE_PROVIDER_SITE_OTHER): Payer: Self-pay | Admitting: *Deleted

## 2010-06-05 NOTE — Letter (Signed)
Summary: Generic Letter  [place] [facility]  [address] [place], [place] [address]   Phone: [phone]  Fax: [phone]    05/30/2010  AUDI [place] [address] [facility] [address] [place], [place]  [address]  [place]  To whom it may concern,    Lilyian Quayle has been seen by me for general care for over 5 years.  She suffers from the following co-morbidities related to obesity (GERD, hemorrhoids, HTN, hyperlipidemia and DM2).   Her current weight is 289 lb with height of 6 inches -- equaling a BMI of 48.  The patient has undergone many weight loss attempts and programs including phentermine, dietician consult, weight watchers, personal training at gym , slim fast, liquid protein diet, low calorie and low fat diets, atkins diet, [employer] program and curves exercise and diet program -- with limited success and immediate regain of any weight lost.   I feel this patient would benefit from weight loss surgery because she has failed these attempts for permanent weight loss, and worry that her current medical conditions will become life threatening if she does not get her weight under control.   I appreciate your consideration.  Please contact me for further questions about this nice and dedicated lady.   Sincerely,   Roxy Manns MD

## 2010-06-05 NOTE — Progress Notes (Signed)
Summary: checking on status of letter  Phone Note Call from Patient Call back at [phone]   Caller: Patient Call For: Judith Part MD Summary of Call: Pt is checking to see whether or not you have had a chance to review her weight loss history forms and have been able to complete her letter of medical necessity that she needs.  Please advise. Initial call taken by: Lowella Petties CMA, AAMA,  May 30, 2010 2:38 PM  Follow-up for Phone Call        let her know I have had 3 of those to do recently and she is next  will try to have letter done by the end of the week please send back to me-thanks  Follow-up by: Judith Part MD,  May 30, 2010 3:08 PM  Additional Follow-up for Phone Call Additional follow up Details #1::        Return # is busy will try again later.Lewanda Rife LPN  May 30, 4538 4:17 PM   Patient notified as instructed by telephone. Pt said to call her at [phone] when letter is ready.Lewanda Rife LPN  May 30, 7827 4:25 PM     Additional Follow-up for Phone Call Additional follow up Details #2::    done and in IN box Follow-up by: Judith Part MD,  May 30, 2010 5:33 PM  Additional Follow-up for Phone Call Additional follow up Details #3:: Details for Additional Follow-up Action Taken: Patient notified as instructed by telephone. Letter and forms left at front desk. copies of info are requested to be scanned here.Lewanda Rife LPN  May 31, 5619 8:41 AM

## 2010-06-05 NOTE — Letter (Signed)
Summary: Bariatric Surgery Letter of Medical Necessity,[facility]  Bariatric Surgery Letter of Medical [facility]   Imported By: Beau Fanny 05/31/2010 10:29:52  _____________________________________________________________________  External Attachment:    Type:   Image     Comment:   External Document

## 2010-06-06 LAB — GLUCOSE, CAPILLARY
Glucose-Capillary: 181 mg/dL — ABNORMAL HIGH (ref 70–99)
Glucose-Capillary: 192 mg/dL — ABNORMAL HIGH (ref 70–99)

## 2010-06-12 LAB — GLUCOSE, CAPILLARY
Glucose-Capillary: 256 mg/dL — ABNORMAL HIGH (ref 70–99)
Glucose-Capillary: 295 mg/dL — ABNORMAL HIGH (ref 70–99)
Glucose-Capillary: 300 mg/dL — ABNORMAL HIGH (ref 70–99)

## 2010-06-12 LAB — BASIC METABOLIC PANEL
BUN: 4 mg/dL — ABNORMAL LOW (ref 6–23)
CO2: 30 mEq/L (ref 19–32)
Calcium: 9.1 mg/dL (ref 8.4–10.5)
Chloride: 96 mEq/L (ref 96–112)
GFR calc Af Amer: >60 mL/min (ref >60)

## 2010-06-12 LAB — POCT HEMOGLOBIN-HEMACUE: Hemoglobin: 8.6 g/dL — ABNORMAL LOW (ref 12.0–15.0)

## 2010-06-12 NOTE — Medication Information (Signed)
Summary: Prescription drug search results  Prescription drug search results   Imported By: Kassie Mends 06/06/2010 08:45:33  _____________________________________________________________________  External Attachment:    Type:   Image     Comment:   External Document

## 2010-06-12 NOTE — Miscellaneous (Signed)
Summary: med list update- test strips  Medications Added ONETOUCH ULTRA BLUE  STRP (GLUCOSE BLOOD) check blood sugar twice a day       Clinical Lists Changes  Medications: Changed medication from * DIABETES TEST STRIPS to check sugar two times a day and as needed for DM 250.0 to ONETOUCH ULTRA BLUE  STRP (GLUCOSE BLOOD) check blood sugar twice a day     Prior Medications: MICARDIS 80 MG TABS (TELMISARTAN) 1 by mouth once daily ZOLOFT 50 MG  TABS (SERTRALINE HCL) take one by mouth at bedtime METFORMIN HCL 1000 MG  TABS (METFORMIN HCL) take 1 by mouth two times a day FERROUS SULFATE 325 (65 FE) MG  TBEC (FERROUS SULFATE) take one by mouth two times a day. WELLBUTRIN XL 150 MG  TB24 (BUPROPION HCL) 1 by mouth once daily AMBIEN 10 MG  TABS (ZOLPIDEM TARTRATE) 1/2 to 1 by mouth at bedtime as needed PROAIR HFA 108 (90 BASE) MCG/ACT  AERS (ALBUTEROL SULFATE) 2 puffs q 4 hours as needed wheeze ONETOUCH ULTRA BLUE  STRP (GLUCOSE BLOOD) check blood sugar twice a day MECLIZINE HCL 25 MG TABS (MECLIZINE HCL) 1 by mouth two times a day as needed vertigo PROTONIX 40 MG TBEC (PANTOPRAZOLE SODIUM) 1 by mouth once daily FISH OIL   OIL (FISH OIL) Take 2  tablets by mouth once a day ONE DAILY WOMENS  TABS (MULTIPLE VITAMINS-MINERALS) Take 1 tablet by mouth once a day ZOCOR 20 MG TABS (SIMVASTATIN) 1 by mouth once daily in evening AMARYL 4 MG TABS (GLIMEPIRIDE) 1 by mouth once daily LANTUS SOLOSTAR 100 UNIT/ML SOLN (INSULIN GLARGINE) inject 10 units once daily as directed DIFLUCAN 150 MG TABS (FLUCONAZOLE) take one by mouth now and repeat in 3 days Current Allergies: ! * GLUCOTROL ! PERCOCET BUSPAR

## 2010-07-05 ENCOUNTER — Telehealth: Payer: Self-pay | Admitting: *Deleted

## 2010-07-05 ENCOUNTER — Other Ambulatory Visit (HOSPITAL_COMMUNITY): Payer: Self-pay | Admitting: Surgery

## 2010-07-05 NOTE — Telephone Encounter (Signed)
Pt has faxed over her blood glucose log for your review, this is on your shelf.  Advised pt that you are out until Monday.

## 2010-07-05 NOTE — Telephone Encounter (Signed)
Dr Milinda Antis, I saw the note from Encarnacion Chu and I called pt to let her know that you had seen the log and it had been sent for scanning and had not returned yet and apologized for the delay. Pt said that was OK and hoped to hear from your next week. Thank you.

## 2010-07-06 NOTE — Telephone Encounter (Signed)
Patient notified as instructed by telephone. Pt is presently taking Lantus 10 Units daily Pt will send blood sugar log in 2 weeks and if any low sugars will notify Dr Milinda Antis sooner.

## 2010-07-06 NOTE — Telephone Encounter (Signed)
Please verify how much lantus she is taking  If she is on 10 units -- increase to 20 units daily for 1 week and then increase to 30 units daily  Let me know if low sugars Update me with sugar readings in 2 weeks please  Thanks

## 2010-07-19 ENCOUNTER — Ambulatory Visit: Payer: BC Managed Care – PPO | Admitting: *Deleted

## 2010-07-26 ENCOUNTER — Ambulatory Visit (HOSPITAL_COMMUNITY): Payer: BC Managed Care – PPO

## 2010-08-04 ENCOUNTER — Encounter: Payer: Self-pay | Admitting: Internal Medicine

## 2010-08-04 ENCOUNTER — Encounter: Payer: Self-pay | Admitting: Family Medicine

## 2010-08-04 ENCOUNTER — Ambulatory Visit (INDEPENDENT_AMBULATORY_CARE_PROVIDER_SITE_OTHER): Payer: BC Managed Care – PPO | Admitting: Internal Medicine

## 2010-08-04 VITALS — BP 128/78 | HR 84 | Temp 100.3°F | Resp 20 | Ht 65.0 in | Wt 287.0 lb

## 2010-08-04 DIAGNOSIS — J019 Acute sinusitis, unspecified: Secondary | ICD-10-CM

## 2010-08-04 MED ORDER — AMOXICILLIN 500 MG PO TABS: 1000.0000 mg | ORAL_TABLET | Freq: Two times a day (BID) | ORAL | Status: AC

## 2010-08-04 NOTE — Progress Notes (Signed)
Subjective:    Patient ID: [name], female    DOB: 1965-06-15, 45 y.o.   MRN: [id number]  HPI Sick for 5 days Felt cold/chilled Congested, cough with green mucus Stuffed up ears Migraine HA and awakened with vomiting  Has maxillary pain SOme chest and back pain--from coughing  Some fever No SOB other than in coughing spell  Hasn't taken any OTC meds  Current outpatient prescriptions:albuterol (PROAIR HFA) 108 (90 BASE) MCG/ACT inhaler, Inhale 2 puffs into the lungs every 4 (four) hours as needed.  , Disp: , Rfl: ;  buPROPion (WELLBUTRIN XL) 150 MG 24 hr tablet, Take 150 mg by mouth daily.  , Disp: , Rfl: ;  ferrous sulfate 325 (65 FE) MG tablet, Take 325 mg by mouth 2 (two) times daily.  , Disp: , Rfl:  glimepiride (AMARYL) 4 MG tablet, Take 4 mg by mouth daily before breakfast.  , Disp: , Rfl: ;  insulin glargine (LANTUS SOLOSTAR) 100 UNIT/ML injection, Inject 30 Units into the skin daily. , Disp: , Rfl: ;  metFORMIN (GLUCOPHAGE) 1000 MG tablet, Take 1,000 mg by mouth 2 (two) times daily with a meal.  , Disp: , Rfl: ;  Multiple Vitamin (MULTIVITAMIN) capsule, Take 1 capsule by mouth daily.  , Disp: , Rfl:  Omega-3 Fatty Acids (FISH OIL PO), Take 2 capsules by mouth daily.  , Disp: , Rfl: ;  pantoprazole (PROTONIX) 40 MG tablet, Take 40 mg by mouth daily.  , Disp: , Rfl: ;  sertraline (ZOLOFT) 50 MG tablet, Take 50 mg by mouth at bedtime.  , Disp: , Rfl: ;  simvastatin (ZOCOR) 20 MG tablet, Take 20 mg by mouth at bedtime.  , Disp: , Rfl: ;  telmisartan (MICARDIS) 80 MG tablet, Take 80 mg by mouth daily.  , Disp: , Rfl:  meclizine (ANTIVERT) 25 MG tablet, Take 25 mg by mouth 2 (two) times daily as needed.  , Disp: , Rfl: ;  zolpidem (AMBIEN) 10 MG tablet, 1/2 - 1 tablet by mouth at bedtime as needed. , Disp: , Rfl:   Past Medical History  Diagnosis Date  . Anemia     iron deficiency  . Asthma   . Depression   . Diabetes mellitus   . Hypertension   . Obesity     Past  Surgical History  Procedure Date  . Cholecystectomy   . Hemorrhoid surgery   . Tubal ligation   . Shoulder surgery 06/2009    Family History  Problem Relation Age of Onset  . Cancer Father     colon cancer    History   Social History  . Marital Status: Married    Spouse Name: N/A    Number of Children: N/A  . Years of Education: N/A   Occupational History  . Not on file.   Social History Main Topics  . Smoking status: Former Games developer  . Smokeless tobacco: Not on file  . Alcohol Use: Not on file  . Drug Use: Not on file  . Sexually Active: Not on file   Other Topics Concern  . Not on file   Social History Narrative  . No narrative on file   Review of Systems Has felt "thumps" in chest---with cough No rash No nausea Appetite is down    Objective:   Physical Exam  Constitutional: She appears well-developed and well-nourished. No distress.  HENT:  Right Ear: External ear normal.  Left Ear: External ear normal.  Mouth/Throat: Oropharynx is clear  and moist. No oropharyngeal exudate.       TMs normal Moderate nasal congestion Mild maxillary tenderness  Neck: Normal range of motion. No thyromegaly present.  Pulmonary/Chest: Effort normal and breath sounds normal. No respiratory distress. She has no wheezes. She has no rales.  Lymphadenopathy:    She has no cervical adenopathy.          Assessment & Plan:

## 2010-08-04 NOTE — Patient Instructions (Signed)
Try some tylenol or advil for the fever Call next week if not improving

## 2010-08-08 ENCOUNTER — Ambulatory Visit (INDEPENDENT_AMBULATORY_CARE_PROVIDER_SITE_OTHER): Payer: BC Managed Care – PPO | Admitting: Family Medicine

## 2010-08-08 ENCOUNTER — Ambulatory Visit (INDEPENDENT_AMBULATORY_CARE_PROVIDER_SITE_OTHER)
Admission: RE | Admit: 2010-08-08 | Discharge: 2010-08-08 | Disposition: A | Discharge status: Home / Self Care | Payer: BC Managed Care – PPO | Source: Ambulatory Visit | Attending: Family Medicine | Admitting: Family Medicine

## 2010-08-08 ENCOUNTER — Encounter: Payer: Self-pay | Admitting: Family Medicine

## 2010-08-08 VITALS — BP 114/78 | HR 104 | Temp 99.0°F | Resp 24 | Ht 65.0 in | Wt 287.2 lb

## 2010-08-08 DIAGNOSIS — J40 Bronchitis, not specified as acute or chronic: Secondary | ICD-10-CM

## 2010-08-08 MED ORDER — MOXIFLOXACIN HCL 400 MG PO TABS: 400.0000 mg | ORAL_TABLET | Freq: Every day | ORAL | Status: AC

## 2010-08-08 MED ORDER — HYDROCOD POLST-CHLORPHEN POLST 10-8 MG/5ML PO LQCR: 5.0000 mL | Freq: Two times a day (BID) | ORAL | Status: DC | PRN

## 2010-08-08 NOTE — Progress Notes (Signed)
Subjective:    Patient ID: [name], female    DOB: May 17, 1965, 45 y.o.   MRN: [id number]  HPI Here for f/u of sinusitis/ uri Former smoker Seen past sat in clinic and tx with amox for sinusitis  Started last Tuesday  Family member was killed in a car accident-- was up all night several days/ sitting outside Started with sore throat and general malaise Chills and fatigue by wed with fever of 99.7 Then aches all over  Then cough and congestion   In general feels worse  Sinus pressure is horrible - more on the left  Very congested -- yellow mucous L ear is full feeling   Coughs every time she lies down  Prod of yellow mucous Some dizziness occ vomits from cough  Sat 100.3 - highest it has been   Was exp to baby with a cold prior   Not wheezing but tight feeling   Pt is also a diabetic so risk of pneumonia is high  Past Medical History  Diagnosis Date  . Anemia     iron deficiency  . Asthma   . Depression   . Diabetes mellitus   . Hypertension   . Obesity     History   Social History  . Marital Status: Married    Spouse Name: N/A    Number of Children: N/A  . Years of Education: N/A   Occupational History  . Not on file.   Social History Main Topics  . Smoking status: Former Games developer  . Smokeless tobacco: Not on file  . Alcohol Use: Not on file  . Drug Use: Not on file  . Sexually Active: Not on file   Other Topics Concern  . Not on file   Social History Narrative  . No narrative on file   Scheduled Meds:   Continuous Infusions: PRN Meds:.   Review of Systems Review of Systems  Constitutional: Negative for , appetite change,and unexpected weight change. pos for fatigue and fever  Eyes: Negative for pain and visual disturbance.  Respiratory: pos for cough/ sob/ mild wheezing  Cardiovascular: Negative for cp or edema .   Gastrointestinal: Negative for nausea, diarrhea and constipation.  Genitourinary: Negative for urgency and frequency.    Skin: Negative for pallor.  Neurological: Negative for weakness, light-headedness, numbness and headaches.  Hematological: Negative for adenopathy. Does not bruise/bleed easily.  Psychiatric/Behavioral: Negative for dysphoric mood. The patient is not nervous/anxious.          Objective:   Physical Exam  Constitutional: She appears well-developed and well-nourished. No distress.       Obese and well appearing   HENT:  Head: Normocephalic and atraumatic.  Right Ear: External ear normal.  Left Ear: External ear normal.  Mouth/Throat: Oropharynx is clear and moist.       Sinus tenderness throughout worse on L max and ethmoid   Eyes: Conjunctivae and EOM are normal. Pupils are equal, round, and reactive to light.  Neck: Normal range of motion. Neck supple. No JVD present.  Cardiovascular: Normal rate, regular rhythm and normal heart sounds.   Pulmonary/Chest: Effort normal.       Diff distant bs with harsh bs occ wheeze only on forced exp  No rales  Few isol rhonchi   Abdominal: There is no tenderness.  Musculoskeletal: She exhibits no edema.  Lymphadenopathy:    She has no cervical adenopathy.  Skin: Skin is warm and dry. No rash noted. No erythema. No pallor.  Psychiatric: She has a normal mood and affect.          Assessment & Plan:

## 2010-08-08 NOTE — Assessment & Plan Note (Signed)
With acute sinusitis not imp with amox cxr today tx with avelox and also tussionex for cough (disc caution of sedation) Rest/ fluids and update if worse or not imp

## 2010-08-08 NOTE — Patient Instructions (Signed)
Chest x ray today  Lots of fluids  tussionex for cough as needed  avelox for infection (stop the amoxicilliin)  If you get worse or short of breath call If not improving in 1 week - follow up  Out of work today, tomorrow

## 2010-08-09 ENCOUNTER — Other Ambulatory Visit: Payer: Self-pay | Admitting: Family Medicine

## 2010-08-09 DIAGNOSIS — E7800 Pure hypercholesterolemia, unspecified: Secondary | ICD-10-CM

## 2010-08-10 NOTE — Discharge Summary (Signed)
NAMEBARBIE, [name]             ACCOUNT NO.:  [id number]   MEDICAL RECORD NO.:  [id number]          PATIENT TYPE:  INP   LOCATION:  3731                         FACILITY:  [facility]   PHYSICIAN:  Jesse Sans. Wall, M.D.   DATE OF BIRTH:  [birth year]   DATE OF ADMISSION:  10/10/2004  DATE OF DISCHARGE:  10/13/2004                                 DISCHARGE SUMMARY   PRINCIPAL DIAGNOSIS:  Chest pain.   OTHER DIAGNOSES:  1.  Hypertension.  2.  Obesity.   ALLERGIES:  No known drug allergies.   PROCEDURE:  Left heart cardiac catheterization.   HISTORY OF PRESENT ILLNESS:  A 45 year old obese white female with history  of hypertension who was in her usual state of health until October 10, 2004,  when she began to experience nausea and diaphoresis as well as 7/10 chest  discomfort that was recurrent with exertion.  She then became diaphoretic  and pale.  Took her blood pressure, it was 147/103 and she was taken into  the [facility] ED where EKG showed inverted T-wave in  lead I without acute ST segment elevation.  She subsequently ruled out for  MI and was admitted for further evaluation.   HOSPITAL COURSE:  The patient underwent left heart cardiac catheterization  on October 11, 2004, which showed completely normal coronary arteries with an  LVEF of 65%.  She was noted to have an elevated D-dimer at 0.93 and  therefore underwent a chest CT to rule out PE and this was found to be  negative.  From a cardiac standpoint, she was cleared.  We consulted  internal medicine who felt that perhaps the symptoms she experienced on the  day of admission could be secondary to viral illness or vagal reaction.  The  patient did complain of flatulence which they felt perhaps could have  contributed to vagal reaction and therefore she was started on Flagyl 500 mg  t.i.d. as well as Flora-Q and Creon-10.  The patient has been feeling well  without recurrent discomfort and is being discharged  home today in  satisfactory condition.   DISCHARGE LABORATORY DATA:  Hemoglobin 11.0, hematocrit 33.1, wbc 7.6,  platelets 238, MCV 86.7.  Sodium 140, potassium 3.3, chloride 104, CO2 30,  BUN 6, creatinine 0.8, glucose 97.  Total bilirubin 0.7, alkaline  phosphatase 75, AST 22, ALT 19, albumin 3.8.  Cardiac enzymes negative x3.  Total cholesterol 135, triglycerides 102, HDL 32, LDL 83.  Calcium 8.1.  Urinalysis was negative.  H. pylori is pending.  HCG was negative.  TSH  1.890.  D-dimer was elevated at 0.93 with a negative chest CT.   DISPOSITION:  Patient is being discharged home in good condition.   FOLLOW UP APPOINTMENTS:  She is to follow up with her primary care  physician, Dr. Milinda Antis, in one to two weeks.   DISCHARGE MEDICATIONS:  1.  Micardis 40 mg daily.  2.  Albuterol inhaler p.r.n.  3.  Flagyl 500 mg t.i.d. x8 additional days.  4.  Creon-10 one tablet t.i.d. with meals.  5.  Flora-Q one  tablet daily.   OUTSTANDING LABORATORY STUDIES:  H. pylori is pending.   DURATION OF DISCHARGE ENCOUNTER:  35 minutes.       CRB/MEDQ  D:  10/13/2004  T:  10/14/2004  Job:  [id number]   cc:   Thomas C. Wall, M.D.

## 2010-08-10 NOTE — Procedures (Signed)
[place]  Patient:    [name], [name]                    MRN: [id number] Proc. Date: 07/31/99 Adm. Date:  [id number] Disc. Date: [id number] Attending:  Louie Bun CC:         Roxy Manns, M.D. [facility]                           Procedure Report  PROCEDURE PERFORMED:  Colonoscopy.  ENDOSCOPIST:  Everardo All. Madilyn Fireman, M.D.  INDICATIONS FOR PROCEDURE:  Intermittent rectal bleeding and loose stools in a patient with family history of colon cancer in a first degree relative.  DESCRIPTION OF PROCEDURE:  The patient was placed in the left lateral decubitus position and placed on the pulse monitor and continuous low flow oxygen delivered by nasal cannula.  She was sedated with 75 mg IV Demerol and 8 mg IV Versed.  The Olympus video colonoscope was inserted into the rectum and advanced to the cecum, confirmed by transillumination of McBurneys point and visualization of the ileocecal valve and appendiceal orifice.  The prep was excellent.  The cecum, ascending, transverse, descending and sigmoid colon all appeared normal with no masses, polyps, diverticula or other mucosal abnormalities.  The rectum likewise appeared normal on retroflex view.  The anus only revealed small internal hemorrhoids, no stigma of hemorrhage.  There were some moderate-sized external hemorrhoids as well.  The colonoscope was then withdrawn and the patient returned to the recovery room in stable condition.  She tolerated the procedure well and there were no immediate complications.  IMPRESSION:  Internal hemorrhoids, otherwise normal colonoscopy.  PLAN:  Treat hemorrhoids symptomatically.  Will consider a course of Proctofoam and follow up in the office in three to four weeks. DD:  07/31/99 TD:  08/02/99 Job: [id number]

## 2010-08-10 NOTE — Cardiovascular Report (Signed)
NAMENURY, NEBERGALL NO.:  [id number]   MEDICAL RECORD NO.:  [id number]          PATIENT TYPE:  INP   LOCATION:  3731                         FACILITY:  [facility]   PHYSICIAN:  Jonelle Sidle, M.D. LHCDATE OF BIRTH:  1965-03-29   DATE OF PROCEDURE:  10/11/2004  DATE OF DISCHARGE:                              CARDIAC CATHETERIZATION   REFERRING PHYSICIAN:  Willa Rough, M.D.   INDICATIONS:  Ms. Schoppe is a 45 year old woman with a history of  hypertension, obesity and recent chest discomfort. This test is requested to  evaluate the coronary anatomy and assess for any potential  revascularizations strategies.   PROCEDURES PERFORMED:  1.  Left heart catheterization.  2.  Selective coronary angiography.  3.  Left ventriculography.   ACCESS AND EQUIPMENT:  The area about the right femoral artery was  anesthetized 1% lidocaine. A 6-French sheath was placed in the right femoral  artery via modified Seldinger technique. Standard preformed 6-French JL-4  and JR-4 catheters were used for selective coronary angiography and an  angled pigtail catheter was used for left heart catheterization and left  ventriculography. All exchanges were made over wire. The patient tolerated  the procedure well without any complications. Marland Kitchen   HEMODYNAMIC RESULTS:  Left ventricle 131/15. Aorta 131/80.   ANGIOGRAPHIC FINDINGS:  1.  Left main coronary artery is free of significant flow-limiting coronary      atherosclerosis.  2.  The left anterior descending is a medium to large caliber vessel with      one large bifurcating diagonal branch noted proximally. There is no      significant flow-limiting coronary atherosclerosis noted.  3.  The left circumflex is large and dominant. There is also a large ramus      intermedius branch. No significant flow-limiting coronary      atherosclerosis noted.  4.  Right coronary artery is relatively small and nondominant. They are two      medium  sized right ventricular branch is noted. No flow-limiting      coronary atherosclerosis noted. The patient did have some catheter      induced spasm of the right coronary artery which resolved with 150      micrograms of intracoronary nitroglycerin.   Left ventriculography was performed in the RAO projection and revealed  ejection fraction approximately 65% with trace mitral regurgitation in the  setting of ventricular ectopy.   DIAGNOSES:  1.  No significant flow-limiting coronary atherosclerosis noted within the      major epicardial vessels. There was some catheter-induced spasm of the      right coronary artery which improved with intracoronary nitroglycerin.      Doubt that this is clinically significant and more related to catheter      manipulation.  2.  Left ventricular ejection fraction approximately 65%. The left      ventricular end-diastolic pressure of 15 mmHg and trace mitral      regurgitation in the setting of ventricular ectopy.   DISCUSSION:  I reviewed the results with the patient and Dr. Myrtis Ser. Doubt a  cardiac cause of the  patient's chest pain based on findings. She does have a  mildly increased D-dimer level and has already been scheduled for a CT scan  of the chest tomorrow to exclude pulmonary embolus. Will plan to restart  heparin 6 hours after sheath pull until pulmonary embolus has been firmly  excluded.        SGM/MEDQ  D:  10/11/2004  T:  10/11/2004  Job:  [id number]   cc:   Willa Rough, M.D.

## 2010-08-10 NOTE — Op Note (Signed)
[name], [name]             ACCOUNT NO.:  [id number]   MEDICAL RECORD NO.:  [id number]          PATIENT TYPE:  AMB   LOCATION:  DAY                          FACILITY:  [facility]   PHYSICIAN:  Wilmon Arms. Corliss Skains, M.D. DATE OF BIRTH:  1965/11/07   DATE OF PROCEDURE:  02/26/2005  DATE OF DISCHARGE:                                 OPERATIVE REPORT   PREOPERATIVE DIAGNOSIS:  Prolapsed internal hemorrhoids.   POSTOPERATIVE DIAGNOSIS:  Prolapsed internal hemorrhoids.   PROCEDURE PERFORMED:  Hemorrhoidectomy.   SURGEON:  Wilmon Arms. Tsuei, M.D.   ANESTHESIA:  General endotracheal.   INDICATIONS:  Patient is a 45 year old female who has a past medical history  of obesity as well as noncardiac chest pain, who presents with bleeding  hemorrhoids that occasionally prolapse.  She underwent a colonoscopy  recently, which confirmed these findings.  She presents for  hemorrhoidectomy.   DESCRIPTION OF PROCEDURE:  Patient was brought to the operating room and  placed in a supine position on the operating room table.  After an adequate  level of general endotracheal anesthesia was obtained, the patient's legs  were placed in yellow fin stirrups, and she was positioned in the lithotomy  position.  Her perineum was prepped with Betadine and draped in a sterile  fashion.  The perianal region was infiltrated with 10 cc of 0.25% Marcaine.  The bullet retractor was inserted, and inspection was carried out  circumferentially.  The patient had minimal external hemorrhoid disease.  She had a moderately enlarged anterior as well as a large posterior internal  hemorrhoid.  We began with the anterior hemorrhoid.  A 3-0 Vicryl suture was  placed above the hemorrhoid.  The mucosa was scored with a knife, and  Metzenbaum scissors were used to remove the hemorrhoid tissue.  Cautery was  used to obtain hemostasis.  The 3-0 Vicryl was used to close the edges of  mucosa in a running fashion.  The suture was tied  right at the dentate line.  We then repeated this with the posterior procedure.  Another 3-0 Vicryl was  used just above the hemorrhoid.  The hemorrhoid tissue was removed.  Hemostasis was obtained with cautery, and the edges of the mucosa were  reapproximated with the 3-0 Vicryl in a running fashion.  Hemostasis was  felt to be adequate.  A large piece of Gelfoam was rolled up and inserted  into the rectum.  An ABD pad was then used to cover the area.  The patient  was extubated and brought to the recovery room in stable condition.  All  sponge, needle, and instrument counts were correct.      Wilmon Arms. Tsuei, M.D.  Electronically Signed     MKT/MEDQ  D:  02/26/2005  T:  02/26/2005  Job:  [id number]

## 2010-08-10 NOTE — Assessment & Plan Note (Signed)
[facility]                         GASTROENTEROLOGY OFFICE NOTE   [name], [name]                    MRN:          [id number]  DATE:07/14/2006                            DOB:          02/05/1966    PRIMARY CARE PHYSICIAN:  Marne A. Tower, MD   GI PROBLEM LIST:  1. Symptomatic external hemorrhoids.  Status post colonoscopy October      2006 was otherwise normal.  Had surgical evaluation by Dr. Marcille Blanco.  Hemorrhoid treatments.  Very painful and disagreeable to      patient.  This was in 2007.  Bleeds once to twice weekly from her      hemorrhoids, significant volume per her.  2. Iron-deficiency anemia.  March 2008 labs show hemoglobin 10.9,      ferritin 8.8, likely from significant external hemorrhoids.  See      above.  3. Family history of colon cancer.  Last colonoscopy October 2006,      next due October 2011.   INTERVAL HISTORY:  I last saw [name] about 2 years ago.  She had pretty  significant hemorrhoids at that point.  I arranged for her to see a  Careers adviser.  She had surgical treatment for the hemorrhoids and followup  for 2 to 3 months with the surgeon with topical therapy and she  continues to have bleeding on a 2 to 3 times a week basis.  Recent labs  show she is still anemic.  Heme testing by her primary care physician  showed heme positive stool.  She also mentions she has bloating  discomfort several times a week and her stools are fairly loose for the  past year and a half since changing her diet.  Her diet now consists  much more of cabbage, lettuce, fruits and vegetables.   CURRENT MEDICINES:  Micardis, metformin, Zoloft.   PHYSICAL EXAM:  Weight 291 pounds which is up 5 pounds in the past 2  years.  Blood pressure 122/80, pulse 88.  CONSTITUTIONAL:  Obese, otherwise well appearing.  ABDOMEN:  Soft, nontender, nondistended.  Normal bowel sounds.   ASSESSMENT/PLAN:  A 45 year old woman with symptomatic external  hemorrhoids, bloating, gas, discomfort, morbid obesity. First, she has  iron-deficiency anemia likely from her 2 to 3 times a week bleeding  hemorrhoids.  She will start an iron pill once daily. I told her she  should continue her topical therapies as directed by Dr. Corliss Skains in the  past.  I also offered her a new referral for a surgery consultation to  repeat the hemorrhoid treatments and she declines for now.  She had a  very bad experience the first time.  Her stools are fairly loose and she  has a lot of gas discomforts  I think this is probably from the cabbage  and lettuce and high fiber diet that she has been having, which is good  for her diabetes control, but causing her a lot of discomfort.  I  recommend she decrease the lettuce and cabbage especially.  She will  therefore return to see  me in  2 months' time and we will repeat her labs at that point, after 2  months of iron supplementation and go from there.     Rachael Fee, MD  Electronically Signed    DPJ/MedQ  DD: 07/14/2006  DT: 07/14/2006  Job #: [id number]   cc:   Marne A. Milinda Antis, MD

## 2010-08-10 NOTE — H&P (Signed)
[name], [name] NO.:  [id number]   MEDICAL RECORD NO.:  [id number]          PATIENT TYPE:  EMS   LOCATION:  MAJO                         FACILITY:  [facility]   PHYSICIAN:  Willa Rough, M.D.     DATE OF BIRTH:  03/11/1966   DATE OF ADMISSION:  10/10/2004  DATE OF DISCHARGE:                                HISTORY & PHYSICAL   This very pleasant 45 year old female has no known coronary disease.  She is  significantly overweight and she has hypertension that is not completely  controlled.  She works at one of the orthopedic offices.  Today, she had the  sensation of nausea and diaphoresis and chest discomfort.  She rated it as a  10.  She sat down to rest and when she tried to be active again, she had the  return of the symptoms.  She has not had recent problems.  She has no known  viral illnesses.  She became clammy, pale, and diaphoretic.  Her blood  pressure was 147/103 and she was brought to the emergency room.  The first  EKG has some artifact in it and the second EKG reveals inverted T-waves in  lead 1 and I suspect that there is lead reversal.  There is no acute ST  elevation and her EKG will be repeated, again.   ALLERGIES:  No known drug allergies.   MEDICATIONS:  Micardis and p.r.n. Albuterol.   PAST MEDICAL HISTORY:  See the complete list below.   SOCIAL HISTORY:  The patient lives in [place] with her husband and  children.  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  She lives a sedentary life in terms of exercise.  She does  not smoke.   FAMILY HISTORY:  Her father had a stroke at a relatively young age.   REVIEW OF SYSTEMS:  Today, the patient did have some chills and sweats.  She  does have headaches from time to time and intermittent blurred vision.  Today, the patient felt presyncopal.  She also has some asthma by history.  GU:  The patient has some menorrhagia.  There is no major  musculoskeletal  problems.  As mentioned, she had nausea today, otherwise, her review of  systems is negative.   PHYSICAL EXAMINATION:  VITAL SIGNS:  Temperature 98.7, pulse 90, respirations 22, blood pressure  improved to 141/78.  GENERAL:  She is in no distress.  She is significantly overweight.  She is  oriented to person, time, and place.  Her affect is normal.  HEENT:  Pupils equal and reactive, there is normal extraocular motion. She  has no xanthelasma.  NECK:  No obvious bruits.  CARDIAC:  Normal S1 and S2.  No clicks or significant murmurs.  LUNGS:  Clear to auscultation and percussion.  Her respiratory effort is not  labored.  ABDOMEN:  Obese but soft, there are no bruits, no masses.  GU AND RECTAL:  Deferred.  EXTREMITIES:  Normal distal pulses.  There is no significant edema.  There  are no major musculoskeletal deformities.  NEUROLOGIC:  Grossly intact.   LABORATORY DATA:  As described above, the EKGs show no acute abnormalities,  but a repeat will be needed.  Chest x-ray is pending at this time as are the  majority of her labs.  Her first point of care troponin skin incision  normal.  Hemoglobin 13.4.  Chemistries are not available yet.  D-dimer is  pending.   PROBLEMS:  1.  Significant obesity.  2.  Hypertension and some noncompliance from the patient.  3.  Episode today concerning chest pain with nausea, diaphoresis, and      clamminess.   At this point, there is no proof of MI.  All potential workups have been  considered.  I am quite concerned about her presentation today.  I  personally feel that aggressive evaluation is indicated and I will finalize  with her about proceeding with catheterization.  We will be sure that her D-  dimer has been checked in the meantime and her chest x-ray.       JK/MEDQ  D:  10/10/2004  T:  10/10/2004  Job:  [id number]

## 2010-08-13 ENCOUNTER — Other Ambulatory Visit: Payer: BC Managed Care – PPO

## 2010-08-17 ENCOUNTER — Ambulatory Visit: Payer: BC Managed Care – PPO | Admitting: Family Medicine

## 2010-08-22 ENCOUNTER — Encounter (INDEPENDENT_AMBULATORY_CARE_PROVIDER_SITE_OTHER): Payer: Self-pay | Admitting: General Surgery

## 2010-09-05 ENCOUNTER — Telehealth: Payer: Self-pay | Admitting: *Deleted

## 2010-09-05 DIAGNOSIS — J329 Chronic sinusitis, unspecified: Secondary | ICD-10-CM

## 2010-09-05 NOTE — Telephone Encounter (Signed)
Pt was seen on 5/16 for a sinus infection.  This got better with 2nd round of antibiotics but this past week end the same symptoms came back- cough, congestion, green drainage. No fever but does have some shortness of breath.  She is asking if another round of antibiotic can be called in to [facility].

## 2010-09-05 NOTE — Telephone Encounter (Signed)
Patient notified as instructed by telephone. Pt is agreeable to get CT of sinus. Pt will wait to hear from pt care coordinator and pt can be reached at [phone].

## 2010-09-05 NOTE — Telephone Encounter (Signed)
At this point we need to get a CT of her sinuses to see what's going on - before we tx with abx again  Let me know if agreeable and I will order

## 2010-09-05 NOTE — Telephone Encounter (Signed)
Will ref for CT and send to [facility]

## 2010-09-06 ENCOUNTER — Ambulatory Visit: Payer: BC Managed Care – PPO | Admitting: *Deleted

## 2010-09-06 NOTE — Telephone Encounter (Signed)
CT Sinus at [facility] on 09/07/2010.

## 2010-09-07 ENCOUNTER — Other Ambulatory Visit: Payer: Self-pay

## 2010-09-07 ENCOUNTER — Ambulatory Visit (INDEPENDENT_AMBULATORY_CARE_PROVIDER_SITE_OTHER)
Admission: RE | Admit: 2010-09-07 | Discharge: 2010-09-07 | Disposition: A | Discharge status: Home / Self Care | Payer: BC Managed Care – PPO | Source: Ambulatory Visit | Attending: Family Medicine | Admitting: Family Medicine

## 2010-09-07 DIAGNOSIS — J329 Chronic sinusitis, unspecified: Secondary | ICD-10-CM

## 2010-09-07 MED ORDER — MOXIFLOXACIN HCL 400 MG PO TABS: 400.0000 mg | ORAL_TABLET | Freq: Every day | ORAL | Status: DC

## 2010-09-07 NOTE — Telephone Encounter (Signed)
Patient notified as instructed by telephone results of CT paranasal sinuses without contrast. Medication phoned to [facility] pharmacy as instructed. Medication list updated. CT report sent to be scanned. Copy retained at my desk if needed.

## 2010-09-17 ENCOUNTER — Telehealth: Payer: Self-pay | Admitting: *Deleted

## 2010-09-17 NOTE — Telephone Encounter (Signed)
Patient notified. She says that she really needs to be seen tomorrow and that this can't wait until Dr. Milinda Antis returns. We have no openings for tomorrow with any of the providers. Patient is asking if you could add her on at some point tomorrow.

## 2010-09-17 NOTE — Telephone Encounter (Signed)
I have not seen this pt for this issue.. I feel it needs to be run by her primary MD or a MD that has seen her prior to referral. If PCP unavailable.. I think she needs to be reassesed her before referral given last appt was 1 month ago. Work her in as available.

## 2010-09-17 NOTE — Telephone Encounter (Signed)
Patient says that she has been sick for 6 weeks now with a sinus infection. She has had to courses of antibiotics and she is still not feeling any better. She says that her chest continues to feel tight. She can't walk for house to car with out getting SOB. She is asking if she could get a referral to [facility] pulmonology. Please advise.

## 2010-09-18 ENCOUNTER — Encounter: Payer: Self-pay | Admitting: Family Medicine

## 2010-09-18 ENCOUNTER — Ambulatory Visit (INDEPENDENT_AMBULATORY_CARE_PROVIDER_SITE_OTHER): Payer: BC Managed Care – PPO | Admitting: Family Medicine

## 2010-09-18 DIAGNOSIS — J45901 Unspecified asthma with (acute) exacerbation: Secondary | ICD-10-CM | POA: Insufficient documentation

## 2010-09-18 DIAGNOSIS — J45909 Unspecified asthma, uncomplicated: Secondary | ICD-10-CM

## 2010-09-18 MED ORDER — PREDNISONE 20 MG PO TABS: ORAL_TABLET | ORAL | Status: DC

## 2010-09-18 NOTE — Telephone Encounter (Signed)
Patient added at 2:30 today.

## 2010-09-18 NOTE — Assessment & Plan Note (Signed)
No current suggestion of infection...symptoms more consistent with allergic rhinitis with resulting asthma flare.  Start mucinex, nasal saline irrigation to avoid sinus infection.  Treat SOB with prednione and albuterol as needed. Close follow up in 3 days. If not improving consider repeat CXR.Marland Kitchen Although no sign of consolidation on lung exam today. Pt has history of pneumonia.

## 2010-09-18 NOTE — Progress Notes (Signed)
  Subjective:    Patient ID: [name], female    DOB: [birth year], 45 y.o.   MRN: [id number]  HPI  45 year old female pt of Dr. Lucretia Roers seen initially on 5/12 by Dr. Alphonsus Sias... Treated for sinus infection with course of amoxicillin.  Returned with worsened symptoms and cough on 5/16.  CXR showed no acute changes. She was placed on course of avelox for 10 days.  Symptoms improved but returned on 6/13. Nasal green discharge. Facial pressure  Cough, productive returned. Dr. Milinda Antis ordered a CT sinus that showed: low level ethomois and sphenoid disease.  In past 2 weeks she has been having more difficulty breathing. No fever.No wheezing.  Chest and ribs sore from coughing. Can only sleep sitting up. Gets panicky and nervous.  Remote smoker, 3 pack year history.            Review of Systems  Constitutional: Negative for fever and fatigue.  HENT: Positive for congestion, rhinorrhea, sneezing and postnasal drip. Negative for ear pain.   Eyes: Negative for pain.  Respiratory: Positive for cough and shortness of breath. Negative for chest tightness.   Cardiovascular: Negative for chest pain, palpitations and leg swelling.  Gastrointestinal: Negative for abdominal pain.  Genitourinary: Negative for dysuria.       Objective:   Physical Exam  Constitutional: Vital signs are normal. She appears well-developed and well-nourished. She is cooperative.  Non-toxic appearance. She does not appear ill. No distress.  HENT:  Head: Normocephalic.  Right Ear: Hearing, tympanic membrane, external ear and ear canal normal. Tympanic membrane is not erythematous, not retracted and not bulging.  Left Ear: Hearing, tympanic membrane, external ear and ear canal normal. Tympanic membrane is not erythematous, not retracted and not bulging.  Nose: Mucosal edema present. No rhinorrhea. Right sinus exhibits no maxillary sinus tenderness and no frontal sinus tenderness. Left sinus exhibits no  maxillary sinus tenderness and no frontal sinus tenderness.  Mouth/Throat: Uvula is midline and mucous membranes are normal. No oropharyngeal exudate.  Eyes: Conjunctivae, EOM and lids are normal. Pupils are equal, round, and reactive to light. No foreign bodies found.  Neck: Trachea normal and normal range of motion. Neck supple. Carotid bruit is not present. No mass and no thyromegaly present.  Cardiovascular: Normal rate, regular rhythm, S1 normal, S2 normal, normal heart sounds, intact distal pulses and normal pulses.  Exam reveals no gallop and no friction rub.   No murmur heard. Pulmonary/Chest: Effort normal and breath sounds normal. Not tachypneic. No respiratory distress. She has no decreased breath sounds. She has no wheezes. She has no rhonchi. She has no rales.  Abdominal: Normal appearance.  Neurological: She is alert.  Skin: Skin is warm, dry and intact. No rash noted.  Psychiatric: Her speech is normal. Her mood appears not anxious. Cognition and memory are normal. She does not exhibit a depressed mood.          Assessment & Plan:

## 2010-09-18 NOTE — Patient Instructions (Addendum)
Recommend course of steroids.  Start zyrtec no decongestant, 10  at bedtime for allergies.  Can also add mucinex. Nasal saline irrigation 2-3 times a day. Follow up in office with myself  in next 3 days.

## 2010-09-21 ENCOUNTER — Encounter: Payer: Self-pay | Admitting: Family Medicine

## 2010-09-21 ENCOUNTER — Ambulatory Visit (INDEPENDENT_AMBULATORY_CARE_PROVIDER_SITE_OTHER): Payer: BC Managed Care – PPO | Admitting: Family Medicine

## 2010-09-21 DIAGNOSIS — J45901 Unspecified asthma with (acute) exacerbation: Secondary | ICD-10-CM

## 2010-09-21 NOTE — Patient Instructions (Addendum)
Continue current regimen. Return in 1 week to follow up with Dr. Milinda Antis to further discuss/evaluate area of prominence you feel over lower sternum. I do believe that  a lot of your difficulty breathing and feeling of restriction of lungs would be improved with weight loss. Follow blood pressure and pulse at home or pharmacy to make sure returns to normal after stopping prednisone.

## 2010-09-21 NOTE — Progress Notes (Signed)
  Subjective:    Patient ID: [name], female    DOB: 1965-10-21, 45 y.o.   MRN: [id number]  HPI  45 year old female with sinusitis in early May, returned to office 3 days ago with recurrent symptoms. Dx with acute asthma exacerbation. Started on prednisone, mucinex, nasal saline and albuterol prn. Also started on zyrtec at bedtime as allergies instead of infection were thought to be the trigger.  She reports that coughing  has subsided a lot. Shortness of breath is about the same. She is now hoarse. Less yellow green phlegm...she feels more of issues are in her throat than in her lungs.  No fever.No chest pain.  She continues to note swelling around lower sternum.. This has been present for about 1 year. She feels that this restricts her breathing. She feels that this area is getting worse in that it restricts her breathing more. No weight gain since last year. She has talked with Dr. Milinda Antis and Dr. Christella Hartigan about this. Endoscopy was nml in  11/2009.  BP is up likely due to prednisone and she rushed to office today.    Review of Systems  Constitutional: Negative for fever and fatigue.  HENT: Negative for ear pain.   Eyes: Negative for pain.  Respiratory: Positive for shortness of breath. Negative for cough, chest tightness and wheezing.   Cardiovascular: Negative for chest pain, palpitations and leg swelling.  Gastrointestinal: Negative for abdominal pain.  Genitourinary: Negative for dysuria.       Objective:   Physical Exam  Constitutional: Vital signs are normal. She appears well-developed and well-nourished. She is cooperative.  Non-toxic appearance. She does not appear ill. No distress.       Morbidly obese with central obesity. Prominent Xiphoid process but no discrete mass palpated over lower sternum upper abdomen.  HENT:  Head: Normocephalic.  Right Ear: Hearing, tympanic membrane, external ear and ear canal normal. Tympanic membrane is not erythematous, not  retracted and not bulging.  Left Ear: Hearing, tympanic membrane, external ear and ear canal normal. Tympanic membrane is not erythematous, not retracted and not bulging.  Nose: Mucosal edema and rhinorrhea present. Right sinus exhibits no maxillary sinus tenderness and no frontal sinus tenderness. Left sinus exhibits no maxillary sinus tenderness and no frontal sinus tenderness.  Mouth/Throat: Uvula is midline, oropharynx is clear and moist and mucous membranes are normal.  Eyes: Conjunctivae, EOM and lids are normal. Pupils are equal, round, and reactive to light. No foreign bodies found.  Neck: Trachea normal and normal range of motion. Neck supple. Carotid bruit is not present. No mass and no thyromegaly present.  Cardiovascular: Normal rate, regular rhythm, S1 normal, S2 normal, normal heart sounds, intact distal pulses and normal pulses.  Exam reveals no gallop and no friction rub.   No murmur heard. Pulmonary/Chest: Effort normal and breath sounds normal. Not tachypneic. No respiratory distress. She has no decreased breath sounds. She has no wheezes. She has no rhonchi. She has no rales.  Genitourinary: Vagina normal and uterus normal.  Neurological: She is alert.  Skin: Skin is warm, dry and intact. No rash noted.  Psychiatric: Her speech is normal and behavior is normal. Judgment normal. Her mood appears not anxious. Cognition and memory are normal. She does not exhibit a depressed mood.          Assessment & Plan:

## 2010-09-21 NOTE — Assessment & Plan Note (Addendum)
Improved on prednisone. SE of pred likely causing increase BP and pulse, pt will follow this at home.   Prominence over lower strenum upper epigatrum... No abnormality seen on CXR, except lung fields likely restricted due to weight loss. Encouraged pt to work on weight loss to improve her breathing. She remains concerned about this area of prominence so I recommend she follow up with Dr. Milinda Antis to discuss it further.

## 2010-09-28 ENCOUNTER — Encounter: Payer: Self-pay | Admitting: Family Medicine

## 2010-09-28 ENCOUNTER — Ambulatory Visit (INDEPENDENT_AMBULATORY_CARE_PROVIDER_SITE_OTHER): Payer: BC Managed Care – PPO | Admitting: Family Medicine

## 2010-09-28 DIAGNOSIS — I10 Essential (primary) hypertension: Secondary | ICD-10-CM

## 2010-09-28 DIAGNOSIS — G478 Other sleep disorders: Secondary | ICD-10-CM

## 2010-09-28 DIAGNOSIS — J45909 Unspecified asthma, uncomplicated: Secondary | ICD-10-CM

## 2010-09-28 DIAGNOSIS — J45901 Unspecified asthma with (acute) exacerbation: Secondary | ICD-10-CM

## 2010-09-28 MED ORDER — FLUTICASONE-SALMETEROL 100-50 MCG/DOSE IN AEPB: 1.0000 | INHALATION_SPRAY | Freq: Two times a day (BID) | RESPIRATORY_TRACT | Status: DC

## 2010-09-28 NOTE — Assessment & Plan Note (Signed)
With viral symptoms / allergies that seem to wax and wane Obesity may also play a role Refilled advair that worked well for her in the past  Ref to pulmonary- (given obesity also concerned about sleep apnea and will need that eval if she has lap band as well) Adv to call if worse / or fever or other symptoms

## 2010-09-28 NOTE — Progress Notes (Signed)
Subjective:    Patient ID: [name], female    DOB: 1965/05/06, 45 y.o.   MRN: [id number]  HPI Here for f/u of asthma and bronchitis as well as inc bp and sternum concerns   Thinks her symptoms are coming back again  Throat is raw and occ has some blood in sputum Feels like something in throat - cannot get mucous up  Stopped up and watering nose and eyes Still coughing -- and is worse again at night  Some phlegm - yellow to dark brown   This is 9 weeks   Used to take allergy shots  Is still taking zyrtec   Rough time sleeping at night - cough and some wheeze (mostly tight in throat area) No hx of sleep apnea   Still feels swollen over lower sternum- last exam noted tenderness over xyphoid process  Patient Active Problem List  Diagnoses  . THYROID DISORDER  . DIABETES MELLITUS, TYPE II  . POLYCYSTIC OVARIES  . HYPERLIPIDEMIA  . ANEMIA-IRON DEFICIENCY  . DEPRESSION  . OTHER ORGANIC SLEEP DISORDERS  . HYPERTENSION  . HEMORRHOIDS, INTERNAL, WITH BLEEDING  . EXTERNAL HEMORRHOIDS  . ASTHMA  . GERD  . OBESITY, MORBID  . Acute asthma exacerbation   Past Medical History  Diagnosis Date  . Anemia     iron deficiency  . Asthma   . Depression   . Diabetes mellitus   . Hypertension   . Obesity   . Hyperlipidemia   . Vertigo   . PCOS (polycystic ovarian syndrome)   . Gastritis   . Insomnia    Past Surgical History  Procedure Date  . Cholecystectomy   . Hemorrhoid surgery   . Tubal ligation   . Shoulder surgery 06/2009  . Tonsillectomy and adenoidectomy CHILD  . Intrauterine device insertion 2011   History  Substance Use Topics  . Smoking status: Former Games developer  . Smokeless tobacco: Not on file  . Alcohol Use: No   Family History  Problem Relation Age of Onset  . Cancer Father     colon cancer  . Arthritis Father   . Arthritis Mother   . Asthma Mother   . Hypertension Mother    Allergies  Allergen Reactions  . Buspirone Hcl     REACTION:  nausea  . Glipizide     REACTION: sleepy, low sugar  . Oxycodone-Acetaminophen     REACTION: itching   Current Outpatient Prescriptions on File Prior to Visit  Medication Sig Dispense Refill  . albuterol (PROAIR HFA) 108 (90 BASE) MCG/ACT inhaler Inhale 2 puffs into the lungs every 4 (four) hours as needed.        Marland Kitchen buPROPion (WELLBUTRIN XL) 150 MG 24 hr tablet Take 150 mg by mouth daily.        . ferrous sulfate 325 (65 FE) MG tablet Take 325 mg by mouth 2 (two) times daily.        Marland Kitchen glimepiride (AMARYL) 4 MG tablet Take 4 mg by mouth daily before breakfast.        . insulin glargine (LANTUS SOLOSTAR) 100 UNIT/ML injection Inject 30 Units into the skin daily.       . meclizine (ANTIVERT) 25 MG tablet Take 25 mg by mouth 2 (two) times daily as needed.        . metFORMIN (GLUCOPHAGE) 1000 MG tablet Take 1,000 mg by mouth 2 (two) times daily with a meal.        . Multiple Vitamin (MULTIVITAMIN)  capsule Take 1 capsule by mouth daily.        . Omega-3 Fatty Acids (FISH OIL PO) Take 2 capsules by mouth daily.        . pantoprazole (PROTONIX) 40 MG tablet Take 40 mg by mouth daily.        . sertraline (ZOLOFT) 50 MG tablet Take 50 mg by mouth at bedtime.        . simvastatin (ZOCOR) 20 MG tablet Take 20 mg by mouth at bedtime.        Marland Kitchen telmisartan (MICARDIS) 80 MG tablet Take 80 mg by mouth daily.        Marland Kitchen zolpidem (AMBIEN) 10 MG tablet 1/2 - 1 tablet by mouth at bedtime as needed.       . chlorpheniramine-HYDROcodone (TUSSIONEX PENNKINETIC ER) 10-8 MG/5ML LQCR Take 5 mLs by mouth every 12 (twelve) hours as needed (cough).  140 mL  0         Review of Systems Review of Systems  Constitutional: Negative for fever, appetite change, fatigue and unexpected weight change.  Eyes: Negative for pain and visual disturbance.  ENT pos for nasal congestion- no headache Respiratory: pos for cough/ wheeze and intermittent sob and cw pain    Cardiovascular: Negative.  for edema or  palpitations Gastrointestinal: Negative for nausea, diarrhea and constipation.  Genitourinary: Negative for urgency and frequency.  Skin: Negative for pallor.  Neurological: Negative for weakness, light-headedness, numbness and headaches.  Hematological: Negative for adenopathy. Does not bruise/bleed easily.  Psychiatric/Behavioral: Negative for dysphoric mood. The patient is not nervous/anxious.          Objective:   Physical Exam  Constitutional: She appears well-developed and well-nourished. No distress.       Obese and well but fatigued appearing   HENT:  Head: Normocephalic and atraumatic.  Right Ear: External ear normal.  Left Ear: External ear normal.  Mouth/Throat: Oropharynx is clear and moist.       Boggy nares No sinus tenderness  Eyes: Conjunctivae and EOM are normal. Pupils are equal, round, and reactive to light. Right eye exhibits no discharge. Left eye exhibits no discharge.  Neck: Normal range of motion. Neck supple. No JVD present. Carotid bruit is not present. No thyromegaly present.  Cardiovascular: Normal rate, regular rhythm and normal heart sounds.   Pulmonary/Chest: Effort normal. No respiratory distress.       Diffusely harsh bs Wheeze only on forced exp  Hoarse voice No rales or rhonchi  Abdominal: Soft. Bowel sounds are normal. She exhibits no distension. There is no tenderness.  Musculoskeletal: She exhibits no edema.  Lymphadenopathy:    She has no cervical adenopathy.  Neurological: She is alert. She has normal reflexes.  Skin: Skin is warm and dry. No rash noted. No erythema. No pallor.  Psychiatric: She has a normal mood and affect.          Assessment & Plan:

## 2010-09-28 NOTE — Patient Instructions (Signed)
Use advair twice daily We will do pulmonary referral at check out  Continue protonix  Drink enough fluids and get rest  Update me in meantime if you feel worse or go toER

## 2010-09-28 NOTE — Assessment & Plan Note (Signed)
Runs lower at home- pt is fatigued and agitated today Will continue to monitor

## 2010-09-30 NOTE — Assessment & Plan Note (Signed)
Improved but still symptomatic Ref to pulm  Suspect multifactorial

## 2010-10-26 ENCOUNTER — Encounter: Payer: Self-pay | Admitting: Emergency Medicine

## 2010-10-26 ENCOUNTER — Ambulatory Visit (INDEPENDENT_AMBULATORY_CARE_PROVIDER_SITE_OTHER): Payer: BC Managed Care – PPO | Admitting: Emergency Medicine

## 2010-10-26 DIAGNOSIS — G4700 Insomnia, unspecified: Secondary | ICD-10-CM

## 2010-10-26 DIAGNOSIS — J309 Allergic rhinitis, unspecified: Secondary | ICD-10-CM

## 2010-10-26 DIAGNOSIS — J45909 Unspecified asthma, uncomplicated: Secondary | ICD-10-CM

## 2010-10-26 MED ORDER — FLUTICASONE PROPIONATE 50 MCG/ACT NA SUSP: 2.0000 | Freq: Two times a day (BID) | NASAL | Status: DC

## 2010-10-26 NOTE — Assessment & Plan Note (Signed)
-   need to treat allergies aggressively - SABA prn - full PFT

## 2010-10-26 NOTE — Assessment & Plan Note (Signed)
Order split night PSG

## 2010-10-26 NOTE — Patient Instructions (Signed)
Full pulmonary function testing Continue to use your albuterol as needed Continue zyrtec every day Start using fluticasone nasal spray, 2 sprays each nostril twice a day We will order a sleep study Start using nasal saline washes every day Follow up with Dr [name] in 6 weeks

## 2010-10-26 NOTE — Progress Notes (Signed)
45 yo woman, former small tobacco hx, childhood asthma, allergies. Asthma sx returned in her 17's. Every Spring/Fall her allergies are much worse, accompanied by cough w purulent sputum, wheeze. She has had a lot of difficulty over the last 2 -3 months. Was treated with abx x 3 rounds, prednisone x 1. CT sinuses showed acute sinusitis. Started zyrtec daily, mucinex. Advair was added on 09/28/10, took it for 2 weeks. Feels better even off the Advair (stopped late July). She has a SABA, was using 4x a day when she was sick. Rarely using otherwise. Triggers include allergy season, mowing gas. She described sleep disturbance, snoring.    Past Medical History  Diagnosis Date  . Anemia     iron deficiency  . Asthma   . Depression   . Diabetes mellitus   . Hypertension   . Obesity   . Hyperlipidemia   . Vertigo   . PCOS (polycystic ovarian syndrome)   . Gastritis   . Insomnia      Family History  Problem Relation Age of Onset  . Cancer Father     colon cancer  . Arthritis Father   . Arthritis Mother   . Asthma Mother   . Hypertension Mother      History   Social History  . Marital Status: Married    Spouse Name: N/A    Number of Children: N/A  . Years of Education: N/A   Occupational History  . Not on file.   Social History Main Topics  . Smoking status: Former Smoker -- 0.2 packs/day for 5 years    Quit date: 03/25/2004  . Smokeless tobacco: Never Used  . Alcohol Use: No  . Drug Use: No  . Sexually Active: Not on file   Other Topics Concern  . Not on file   Social History Narrative  . No narrative on file     Allergies  Allergen Reactions  . Buspirone Hcl     REACTION: nausea  . Glipizide     REACTION: sleepy, low sugar  . Oxycodone-Acetaminophen     REACTION: itching     Outpatient Prescriptions Prior to Visit  Medication Sig Dispense Refill  . albuterol (PROAIR HFA) 108 (90 BASE) MCG/ACT inhaler Inhale 2 puffs into the lungs every 4 (four) hours as needed.         Marland Kitchen buPROPion (WELLBUTRIN XL) 150 MG 24 hr tablet Take 150 mg by mouth daily.        . chlorpheniramine-HYDROcodone (TUSSIONEX PENNKINETIC ER) 10-8 MG/5ML LQCR Take 5 mLs by mouth every 12 (twelve) hours as needed (cough).  140 mL  0  . ferrous sulfate 325 (65 FE) MG tablet Take 325 mg by mouth 2 (two) times daily.        . Fluticasone-Salmeterol (ADVAIR DISKUS) 100-50 MCG/DOSE AEPB Inhale 1 puff into the lungs 2 (two) times daily.  1 each  1  . glimepiride (AMARYL) 4 MG tablet Take 4 mg by mouth daily before breakfast.        . insulin glargine (LANTUS SOLOSTAR) 100 UNIT/ML injection Inject 30 Units into the skin daily.       . meclizine (ANTIVERT) 25 MG tablet Take 25 mg by mouth 2 (two) times daily as needed.        . metFORMIN (GLUCOPHAGE) 1000 MG tablet Take 1,000 mg by mouth 2 (two) times daily with a meal.        . Multiple Vitamin (MULTIVITAMIN) capsule Take 1 capsule by mouth  daily.        . Omega-3 Fatty Acids (FISH OIL PO) Take 2 capsules by mouth daily.        . pantoprazole (PROTONIX) 40 MG tablet Take 40 mg by mouth daily.        . sertraline (ZOLOFT) 50 MG tablet Take 50 mg by mouth at bedtime.        . simvastatin (ZOCOR) 20 MG tablet Take 20 mg by mouth at bedtime.        Marland Kitchen telmisartan (MICARDIS) 80 MG tablet Take 80 mg by mouth daily.        Marland Kitchen zolpidem (AMBIEN) 10 MG tablet 1/2 - 1 tablet by mouth at bedtime as needed.        Gen: Pleasant, obese woman, in no distress,  normal affect  ENT: No lesions,  mouth clear,  oropharynx clear, no postnasal drip, mild hirsuitism  Neck: No JVD, no TMG, no carotid bruits  Lungs: No use of accessory muscles, no dullness to percussion, clear without rales or rhonchi  Cardiovascular: RRR, heart sounds normal, no murmur or gallops, trace peripheral edema  Musculoskeletal: No deformities, no cyanosis or clubbing  Neuro: alert, non focal  Skin: Warm, no lesions or rashes  ASTHMA - need to treat allergies aggressively - SABA  prn - full PFT  Chronic allergic rhinitis Zyrtec Add fluticasone Add NSWs   Insomnia Order split night PSG

## 2010-10-26 NOTE — Assessment & Plan Note (Signed)
Zyrtec Add fluticasone Add NSWs

## 2010-11-18 ENCOUNTER — Ambulatory Visit (HOSPITAL_BASED_OUTPATIENT_CLINIC_OR_DEPARTMENT_OTHER): Payer: BC Managed Care – PPO | Attending: Emergency Medicine

## 2010-11-18 DIAGNOSIS — Z79899 Other long term (current) drug therapy: Secondary | ICD-10-CM | POA: Insufficient documentation

## 2010-11-18 DIAGNOSIS — F3289 Other specified depressive episodes: Secondary | ICD-10-CM | POA: Insufficient documentation

## 2010-11-18 DIAGNOSIS — G4733 Obstructive sleep apnea (adult) (pediatric): Secondary | ICD-10-CM | POA: Insufficient documentation

## 2010-11-18 DIAGNOSIS — G4700 Insomnia, unspecified: Secondary | ICD-10-CM

## 2010-11-18 DIAGNOSIS — F329 Major depressive disorder, single episode, unspecified: Secondary | ICD-10-CM | POA: Insufficient documentation

## 2010-11-18 DIAGNOSIS — E119 Type 2 diabetes mellitus without complications: Secondary | ICD-10-CM | POA: Insufficient documentation

## 2010-11-18 DIAGNOSIS — I10 Essential (primary) hypertension: Secondary | ICD-10-CM | POA: Insufficient documentation

## 2010-12-07 ENCOUNTER — Ambulatory Visit (INDEPENDENT_AMBULATORY_CARE_PROVIDER_SITE_OTHER): Payer: BC Managed Care – PPO | Admitting: Emergency Medicine

## 2010-12-07 ENCOUNTER — Encounter: Payer: Self-pay | Admitting: Emergency Medicine

## 2010-12-07 DIAGNOSIS — J45909 Unspecified asthma, uncomplicated: Secondary | ICD-10-CM

## 2010-12-07 DIAGNOSIS — J309 Allergic rhinitis, unspecified: Secondary | ICD-10-CM

## 2010-12-07 DIAGNOSIS — G478 Other sleep disorders: Secondary | ICD-10-CM

## 2010-12-07 LAB — PULMONARY FUNCTION TEST

## 2010-12-07 NOTE — Progress Notes (Signed)
45 yo woman, former small tobacco hx, childhood asthma, allergies. Asthma sx returned in her 29's. Every Spring/Fall her allergies are much worse, accompanied by cough w purulent sputum, wheeze. She has had a lot of difficulty over the last 2 -3 months. Was treated with abx x 3 rounds, prednisone x 1. CT sinuses showed acute sinusitis. Started zyrtec daily, mucinex. Advair was added on 09/28/10, took it for 2 weeks. Feels better even off the Advair (stopped late July). She has a SABA, was using 4x a day when she was sick. Rarely using otherwise. Triggers include allergy season, mowing gas. She described sleep disturbance, snoring.   ROV 12/07/10 -- Hx of allergies and asthma. Had PFT done 9/14, PSG done on 8/26. She is taking zyrec + fluticasone + [place].  She has been using SABA prn, uses after walking long distance. Had an episode acute dyspnea after she heard that her father was ill.    Gen: Pleasant, obese woman, in no distress,  normal affect  ENT: No lesions,  mouth clear,  oropharynx clear, no postnasal drip, mild hirsuitism  Neck: No JVD, no TMG, no carotid bruits  Lungs: No use of accessory muscles, no dullness to percussion, clear without rales or rhonchi  Cardiovascular: RRR, heart sounds normal, no murmur or gallops, trace peripheral edema  Musculoskeletal: No deformities, no cyanosis or clubbing  Neuro: alert, non focal  Skin: Warm, no lesions or rashes  ASTHMA PFT show POSSIBLE mild AFL, nost likely no evidence asthma. I believe most of her sx are UA and related to allergies.  - no scheduled BD's - albuterol prn available for now  Chronic allergic rhinitis - continue zyrtec, nasal steroid, nsw - may need referral for allergy shots in the future if poor control   OTHER ORGANIC SLEEP DISORDERS Await PSG results - I will call her w result and set up CPAP if indicated.,

## 2010-12-07 NOTE — Patient Instructions (Signed)
Your lung function testing does not show evidence for asthma Please continue your zyrtec, fluticasone spray and daily nasal saline washes Continue to have albuterol available to use as needed.  We will contact you with the results of your sleep study and plan to order a CPAP mask if indicated.  Follow with Dr [name] in 2 months

## 2010-12-07 NOTE — Progress Notes (Signed)
PFT done today. 

## 2010-12-07 NOTE — Assessment & Plan Note (Signed)
-   continue zyrtec, nasal steroid, nsw - may need referral for allergy shots in the future if poor control

## 2010-12-07 NOTE — Assessment & Plan Note (Signed)
Await PSG results - I will call her w result and set up CPAP if indicated.,

## 2010-12-07 NOTE — Assessment & Plan Note (Signed)
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 I believe most of her sx are UA and related to allergies.  - no scheduled BD's - albuterol prn available for now

## 2010-12-11 NOTE — Procedures (Signed)
[name], LIMPERT NO.:  [id number]  MEDICAL RECORD NO.:  [id number]          PATIENT TYPE:  OUT  LOCATION:  SLEEP CENTER                 FACILITY:  [facility]  PHYSICIAN:  Coralyn Helling, MD        DATE OF BIRTH:  1965/12/28  DATE OF STUDY:  11/18/2010                           NOCTURNAL POLYSOMNOGRAM  REFERRING PHYSICIAN:  Leslye Peer, MD  INDICATION FOR STUDY:  Ms. Schnieders is a 45 year old female who has a history of diabetes, depression, and hypertension.  She also has snoring, sleep disruption and daytime sleepiness.  She is therefore referred to the Sleep Lab for evaluation of hypersomnia with obstructive sleep apnea.  Height is 5 feet 5 inches, weight is 28 pounds.  BMI is 48.  Neck size is 21.5 inches.  EPWORTH SLEEPINESS SCORE:  16.  MEDICATIONS:  Ambien, iron, metformin, Micardis, omega-3 fish oil, ProAir, Protonix, Timolol, Wellbutrin, Zocor, Zoloft, Amaryl, and Lantus  SLEEP ARCHITECTURE:  Total recording time was 403 minutes.  Total sleep time was 276 minutes.  Sleep efficiency was 68%.  Sleep latency was 38 minutes.  REM latency was 22 minutes.  The patient was slept in both the supine and non-supine positions.  She appeared to have difficulty with sleep initiation, sleep maintenance due to respiratory events.  RESPIRATORY DATA:  The average respiratory rate was 18.  Loud snoring was noted by the technician.  The overall apnea/hypopnea index was 108.9.  There were two central apneic events.  The remainder of the events were obstructive in nature.  The patient did not meet split night study protocol criteria due to insufficient sleep time during the first half of the study.  OXYGEN DATA:  The baseline oxygenation was 94%.  The oxygen saturation nadir was 67%.  The study was conducted without the use of supplemental oxygen.  CARDIAC DATA:  The average heart rate was 89 and the rhythm strip showed sinus rhythm.  MOVEMENT-PARASOMNIA:  The  periodic limb movement index of 0 and the patient had no restroom trips.  IMPRESSIONS-RECOMMENDATIONS:  This study shows evidence for severe obstructive sleep apnea with an apnea/hypopnea index of 108.9.  In addition to diet, excise, and weight reduction, I have to recommend that the patient return to the Sleep Lab for a continuous positive airway pressure titration study.     Coralyn Helling, MD Diplomat, American Board of Sleep Medicine    VS/MEDQ  D:  12/11/2010 13:17:04  T:  12/11/2010 22:19:51  Job:  [id number]  cc:   Leslye Peer, MD [address] [place], [place] [address]

## 2010-12-12 DIAGNOSIS — E119 Type 2 diabetes mellitus without complications: Secondary | ICD-10-CM

## 2010-12-12 DIAGNOSIS — G4733 Obstructive sleep apnea (adult) (pediatric): Secondary | ICD-10-CM

## 2010-12-12 DIAGNOSIS — I10 Essential (primary) hypertension: Secondary | ICD-10-CM

## 2010-12-12 DIAGNOSIS — F329 Major depressive disorder, single episode, unspecified: Secondary | ICD-10-CM

## 2010-12-14 ENCOUNTER — Telehealth: Payer: Self-pay | Admitting: Emergency Medicine

## 2010-12-14 DIAGNOSIS — G4733 Obstructive sleep apnea (adult) (pediatric): Secondary | ICD-10-CM

## 2010-12-14 NOTE — Telephone Encounter (Signed)
Pt has agreed to have CPAP titration study at the sleep center . Order has been sent to [facility]

## 2010-12-14 NOTE — Telephone Encounter (Signed)
Her PSG shows severe OSA. I recommend CPAP titration study. Please refer her for this if she is agreeable

## 2010-12-14 NOTE — Telephone Encounter (Signed)
RB, pls advise results of PSG thanks

## 2010-12-24 ENCOUNTER — Other Ambulatory Visit: Payer: Self-pay | Admitting: *Deleted

## 2010-12-24 MED ORDER — ZOLPIDEM TARTRATE 10 MG PO TABS: 5.0000 mg | ORAL_TABLET | Freq: Every evening | ORAL | Status: DC | PRN

## 2010-12-24 NOTE — Telephone Encounter (Signed)
I believe PCP is Dr. Karie Schwalbe. Will forward to her.

## 2010-12-24 NOTE — Telephone Encounter (Signed)
Px written for call in   

## 2010-12-25 ENCOUNTER — Encounter (HOSPITAL_BASED_OUTPATIENT_CLINIC_OR_DEPARTMENT_OTHER): Payer: BC Managed Care – PPO

## 2010-12-25 LAB — URINALYSIS, ROUTINE W REFLEX MICROSCOPIC
Nitrite: NEGATIVE
Specific Gravity, Urine: 1.015
Urobilinogen, UA: 0.2
pH: 5.5

## 2010-12-25 LAB — DIFFERENTIAL
Basophils Absolute: 0
Basophils Relative: 0
Monocytes Absolute: 0.4
Neutro Abs: 6.8
Neutrophils Relative %: 74

## 2010-12-25 LAB — CBC
HCT: 38.4
Hemoglobin: 12.5
RBC: 4.59
WBC: 9.1

## 2010-12-25 LAB — COMPREHENSIVE METABOLIC PANEL
Albumin: 3.6
Alkaline Phosphatase: 88
BUN: 6
CO2: 28
Chloride: 100
Glucose, Bld: 125 — ABNORMAL HIGH
Potassium: 4.3
Total Bilirubin: 0.5

## 2010-12-25 LAB — GLUCOSE, CAPILLARY

## 2010-12-27 NOTE — Telephone Encounter (Signed)
Medication phoned to [facility] as instructed. Spoke with pt to clarify instructions. Pt takes 1/2 - 1 tab by mouth at bedtime as needed. Med list updated.

## 2011-01-18 ENCOUNTER — Ambulatory Visit (HOSPITAL_BASED_OUTPATIENT_CLINIC_OR_DEPARTMENT_OTHER): Payer: BC Managed Care – PPO | Attending: Emergency Medicine

## 2011-01-18 DIAGNOSIS — G4733 Obstructive sleep apnea (adult) (pediatric): Secondary | ICD-10-CM | POA: Insufficient documentation

## 2011-01-18 DIAGNOSIS — Z79899 Other long term (current) drug therapy: Secondary | ICD-10-CM | POA: Insufficient documentation

## 2011-01-18 DIAGNOSIS — F3289 Other specified depressive episodes: Secondary | ICD-10-CM | POA: Insufficient documentation

## 2011-01-18 DIAGNOSIS — F329 Major depressive disorder, single episode, unspecified: Secondary | ICD-10-CM | POA: Insufficient documentation

## 2011-01-18 DIAGNOSIS — E119 Type 2 diabetes mellitus without complications: Secondary | ICD-10-CM | POA: Insufficient documentation

## 2011-01-18 DIAGNOSIS — I10 Essential (primary) hypertension: Secondary | ICD-10-CM | POA: Insufficient documentation

## 2011-01-21 ENCOUNTER — Other Ambulatory Visit: Payer: Self-pay | Admitting: *Deleted

## 2011-01-21 MED ORDER — GLIMEPIRIDE 4 MG PO TABS: 4.0000 mg | ORAL_TABLET | Freq: Every day | ORAL | Status: DC

## 2011-01-26 DIAGNOSIS — G4733 Obstructive sleep apnea (adult) (pediatric): Secondary | ICD-10-CM

## 2011-01-28 NOTE — Procedures (Signed)
[name], WILDRICK NO.:  [id number]  MEDICAL RECORD NO.:  [id number]          PATIENT TYPE:  OUT  LOCATION:  SLEEP CENTER                 FACILITY:  [facility]  PHYSICIAN:  Coralyn Helling, MD        DATE OF BIRTH:  1965/09/07  DATE OF STUDY:  01/18/2011                           NOCTURNAL POLYSOMNOGRAM  REFERRING PHYSICIAN:  Leslye Peer, MD  FACILITY:  [facility].  REFERRING PHYSICIAN:  Leslye Peer, MD  INDICATION FOR STUDY:  Ms. Disbrow is a 45 year old female who has a history of diabetes, hypertension and depression.  She had an overnight polysomnogram from November 18, 2010, which showed severe obstructive sleep apnea with an apnea-hypopnea index of 108.9, and an oxygen saturation nadir of 67%.  She returns to the Sleep Lab for a CPAP titration study.  Height is 5 feet and 5 inches, weight is 288 pounds, BMI is 48, neck size is 21.5 inches.  EPWORTH SLEEPINESS SCORE:  17.  MEDICATIONS:  Amaryl, Lantus, timolol, Zoloft, Micardis, Ambien, Protonix, Wellbutrin, metformin, and Zocor.  SLEEP ARCHITECTURE:  Total recording time was 361 minutes, total sleep time was 261 minutes, sleep efficiency was 72%, sleep latency was 27 minutes, REM latency was 248 minutes.  The study was notable for the lack of stage III sleep.  The patient slept predominantly in the nonsupine position.  RESPIRATORY DATA:  The average respiratory rate was 21.  The patient was started on CPAP of 4 and increased to 15 cm of water.  With CPAP set at 14 cm of water, the apnea-hypopnea index was reduced to 1.4.  At this pressure setting, the patient was observed in REM sleep, but not supine sleep.  OXYGEN DATA:  The baseline oxygenation was 96%.  The oxygen saturation nadir was 81%.  The study was conducted without the use of supplemental oxygen.  CARDIAC DATA:  The average heart rate was 89 and the rhythm strip showed sinus rhythm.  MOVEMENT-PARASOMNIA:  The periodic  limb movement index was 0 and the patient had 2 restroom trips.  IMPRESSIONS-RECOMMENDATIONS:  This was a successful continuous positive airway pressure titration study.  The patient had good control of her sleep-disordered breathing with continuous positive airway pressure set at 14 cm water.  At this pressure setting, she was observed in REM sleep, but not supine sleep.  In addition to diet, excise, and weight reduction, I would recommend that the patient will be started on continuous positive airway pressure at 14 cm of water and monitored for her clinical response.     Coralyn Helling, MD Diplomat, American Board of Sleep Medicine    VS/MEDQ  D:  01/26/2011 10:52:03  T:  01/27/2011 00:47:17  Job:  [id number]  cc:   Leslye Peer, MD [address] [place], [place] [address]

## 2011-01-31 ENCOUNTER — Other Ambulatory Visit: Payer: Self-pay | Admitting: Emergency Medicine

## 2011-01-31 DIAGNOSIS — G4730 Sleep apnea, unspecified: Secondary | ICD-10-CM

## 2011-02-04 ENCOUNTER — Other Ambulatory Visit (INDEPENDENT_AMBULATORY_CARE_PROVIDER_SITE_OTHER): Payer: BC Managed Care – PPO

## 2011-02-04 DIAGNOSIS — E119 Type 2 diabetes mellitus without complications: Secondary | ICD-10-CM

## 2011-02-04 DIAGNOSIS — E7800 Pure hypercholesterolemia, unspecified: Secondary | ICD-10-CM

## 2011-02-05 LAB — RENAL FUNCTION PANEL
Calcium: 9 mg/dL (ref 8.4–10.5)
GFR: 106.53 mL/min (ref >60.00)
Glucose, Bld: 267 mg/dL — ABNORMAL HIGH (ref 70–99)
Phosphorus: 3.9 mg/dL (ref 2.3–4.6)
Potassium: 4.2 mEq/L (ref 3.5–5.1)
Sodium: 137 mEq/L (ref 135–145)

## 2011-02-05 LAB — AST: AST: 16 U/L (ref 0–37)

## 2011-02-05 LAB — LDL CHOLESTEROL, DIRECT: Direct LDL: 100 mg/dL

## 2011-02-05 LAB — LIPID PANEL: Triglycerides: 239 mg/dL — ABNORMAL HIGH (ref 0.0–149.0)

## 2011-02-08 ENCOUNTER — Ambulatory Visit: Payer: BC Managed Care – PPO | Admitting: Emergency Medicine

## 2011-03-01 ENCOUNTER — Ambulatory Visit: Payer: BC Managed Care – PPO | Admitting: Family Medicine

## 2011-03-31 ENCOUNTER — Emergency Department (HOSPITAL_COMMUNITY)
Admission: EM | Admit: 2011-03-31 | Discharge: 2011-03-31 | Disposition: A | Discharge status: Home / Self Care | Payer: BC Managed Care – PPO | Attending: Emergency Medicine | Admitting: Emergency Medicine

## 2011-03-31 ENCOUNTER — Encounter (HOSPITAL_COMMUNITY): Payer: Self-pay | Admitting: Emergency Medicine

## 2011-03-31 DIAGNOSIS — R040 Epistaxis: Secondary | ICD-10-CM

## 2011-03-31 DIAGNOSIS — Z794 Long term (current) use of insulin: Secondary | ICD-10-CM | POA: Insufficient documentation

## 2011-03-31 DIAGNOSIS — Z79899 Other long term (current) drug therapy: Secondary | ICD-10-CM | POA: Insufficient documentation

## 2011-03-31 DIAGNOSIS — F3289 Other specified depressive episodes: Secondary | ICD-10-CM | POA: Insufficient documentation

## 2011-03-31 DIAGNOSIS — F329 Major depressive disorder, single episode, unspecified: Secondary | ICD-10-CM | POA: Insufficient documentation

## 2011-03-31 DIAGNOSIS — R110 Nausea: Secondary | ICD-10-CM | POA: Insufficient documentation

## 2011-03-31 DIAGNOSIS — I10 Essential (primary) hypertension: Secondary | ICD-10-CM | POA: Insufficient documentation

## 2011-03-31 DIAGNOSIS — E119 Type 2 diabetes mellitus without complications: Secondary | ICD-10-CM | POA: Insufficient documentation

## 2011-03-31 DIAGNOSIS — J45909 Unspecified asthma, uncomplicated: Secondary | ICD-10-CM | POA: Insufficient documentation

## 2011-03-31 NOTE — ED Notes (Signed)
PT states she was asleep on the couch and she awoke feeling like something was on her nose.  She got up to blow her nose and noticed blood "gushing" from her R nares.  She held cloths to her nose and it bled for approx 25 minutes.  Hx of mild epistaxis.  Denies taking anticoagulants or using O2.  States sinus problems for some time.

## 2011-03-31 NOTE — ED Notes (Signed)
PT. REPORTS EPISTAXIS THIS EVENING WHILE LYING DOWN ,  DENIES INJURY .

## 2011-03-31 NOTE — ED Provider Notes (Signed)
History     CSN: [id number]  Arrival date & time 03/31/11  9147   First MD Initiated Contact with Patient 03/31/11 2114      Chief Complaint  Patient presents with  . Epistaxis    (Consider location/radiation/quality/duration/timing/severity/associated sxs/prior treatment) Patient is a 46 y.o. female presenting with nosebleeds. The history is provided by the patient.  Epistaxis  This is a new problem. The current episode started 1 to 2 hours ago. The problem occurs constantly. The problem has been resolved. The problem is associated with an unknown factor. The bleeding has been from both nares. She has tried applying pressure for the symptoms. The treatment provided significant relief. Her past medical history does not include bleeding disorder, colds, sinus problems, nose-picking or frequent nosebleeds.    Past Medical History  Diagnosis Date  . Anemia     iron deficiency  . Asthma   . Depression   . Diabetes mellitus   . Hypertension   . Obesity   . Hyperlipidemia   . Vertigo   . PCOS (polycystic ovarian syndrome)   . Gastritis   . Insomnia     Past Surgical History  Procedure Date  . Cholecystectomy   . Hemorrhoid surgery   . Tubal ligation   . Shoulder surgery 06/2009  . Tonsillectomy and adenoidectomy CHILD  . Intrauterine device insertion 2011    Family History  Problem Relation Age of Onset  . Cancer Father     colon cancer  . Arthritis Father   . Arthritis Mother   . Asthma Mother   . Hypertension Mother     History  Substance Use Topics  . Smoking status: Former Smoker -- 0.2 packs/day for 5 years    Quit date: 03/25/2004  . Smokeless tobacco: Never Used  . Alcohol Use: No    OB History    Grav Para Term Preterm Abortions TAB SAB Ect Mult Living                  Review of Systems  Constitutional: Negative for fever and chills.  HENT: Positive for nosebleeds.   Respiratory: Negative for cough and shortness of breath.   Gastrointestinal:  Positive for nausea (After she swallowed blood). Negative for abdominal pain and diarrhea.  All other systems reviewed and are negative.    Allergies  Buspirone hcl; Glipizide; and Oxycodone-acetaminophen  Home Medications   Current Outpatient Rx  Name Route Sig Dispense Refill  . ALBUTEROL SULFATE HFA 108 (90 BASE) MCG/ACT IN AERS Inhalation Inhale 2 puffs into the lungs every 4 (four) hours as needed. For wheezing    . BUPROPION HCL ER (XL) 150 MG PO TB24 Oral Take 150 mg by mouth daily.      Marland Kitchen CETIRIZINE HCL 10 MG PO TABS Oral Take 10 mg by mouth at bedtime.      Marland Kitchen FERROUS SULFATE 325 (65 FE) MG PO TABS Oral Take 325 mg by mouth 2 (two) times daily.      Marland Kitchen FLUTICASONE PROPIONATE 50 MCG/ACT NA SUSP Nasal Place 2 sprays into the nose 2 (two) times daily as needed. For nasal congestion     . FLUTICASONE-SALMETEROL 100-50 MCG/DOSE IN AEPB Inhalation Inhale 1 puff into the lungs 2 (two) times daily. 1 each 1  . GLIMEPIRIDE 4 MG PO TABS Oral Take 4 mg by mouth daily before breakfast.      . INSULIN GLARGINE 100 UNIT/ML [employer] SOLN Subcutaneous Inject 40 Units into the skin at bedtime.     Marland Kitchen  METFORMIN HCL 1000 MG PO TABS Oral Take 1,000 mg by mouth 2 (two) times daily with a meal.      . MULTIVITAMINS PO CAPS Oral Take 1 capsule by mouth at bedtime.     Marland Kitchen FISH OIL PO Oral Take 2 capsules by mouth daily.      Marland Kitchen PANTOPRAZOLE SODIUM 40 MG PO TBEC Oral Take 40 mg by mouth daily.      . SERTRALINE HCL 50 MG PO TABS Oral Take 50 mg by mouth at bedtime.      Marland Kitchen SIMVASTATIN 20 MG PO TABS Oral Take 20 mg by mouth at bedtime.      . TELMISARTAN 80 MG PO TABS Oral Take 80 mg by mouth daily.      Marland Kitchen ZOLPIDEM TARTRATE 10 MG PO TABS Oral Take 5-10 mg by mouth at bedtime as needed. For sleep    . MECLIZINE HCL 25 MG PO TABS Oral Take 25 mg by mouth 2 (two) times daily as needed. For vertigo      BP 200/109  Pulse 100  Temp(Src) 98.7 F (37.1 C) (Oral)  Resp 20  SpO2 98%  Physical Exam  Nursing note and  vitals reviewed. Constitutional: She is oriented to person, place, and time. She appears well-developed and well-nourished. No distress.  HENT:  Head: Normocephalic and atraumatic.  Nose: No nasal deformity or septal deviation. No epistaxis.       Small areas of blood on anterior septum bilaterally. Hemostatic. No active bleeding.   Eyes: EOM are normal. Pupils are equal, round, and reactive to light.  Neck: Normal range of motion. Neck supple.  Cardiovascular: Normal rate and regular rhythm.  Exam reveals no friction rub.   No murmur heard. Pulmonary/Chest: Effort normal and breath sounds normal. No respiratory distress. She has no wheezes. She has no rales.  Abdominal: Soft. She exhibits no distension. There is no tenderness. There is no rebound.  Musculoskeletal: Normal range of motion. She exhibits no edema.  Neurological: She is alert and oriented to person, place, and time.  Skin: She is not diaphoretic.    ED Course  Procedures (including critical care time)  Labs Reviewed - No data to display No results found.   1. Epistaxis       MDM  46 year old female presents with a nosebleed. No history of trauma. Denies previous history of nosebleeds or picking her nose. Denies cocaine use. Not on anticoagulation or blood thinners. Started bleeding earlier but for about 25 minutes. Bleeding stopped upon arrival to ED. Vitals are stable. On exam no further bleeding. Has 2 small areas of hemostatic bleeding that could have been the source on either side of the anterior nasal septum. No further interventions needed at this time. Instructed to use nasal sprays as needed to keep her mucosa moist. Instructed to followup with her primary care doctor in 3 days. Discharged home in stable condition.       Elwin Mocha, MD 03/31/11 [phone]

## 2011-05-09 ENCOUNTER — Other Ambulatory Visit: Payer: Self-pay | Admitting: Family Medicine

## 2011-05-10 NOTE — Telephone Encounter (Signed)
Pt last seen 09/28/10. Pt cancelled appt 03/01/11. Is it OK to refill?

## 2011-05-12 NOTE — Telephone Encounter (Signed)
Please schedule f/u if due to be seen Will refill electronically

## 2011-05-13 NOTE — Telephone Encounter (Signed)
Jamie please schedule f/u with Dr Milinda Antis. Pt had cancelled appt 03/01/11. Thank you.

## 2011-05-14 NOTE — Telephone Encounter (Signed)
Left Message for pt to call and schedule f/u

## 2011-05-28 ENCOUNTER — Encounter: Payer: Self-pay | Admitting: Family Medicine

## 2011-05-28 ENCOUNTER — Ambulatory Visit (INDEPENDENT_AMBULATORY_CARE_PROVIDER_SITE_OTHER): Payer: BC Managed Care – PPO | Admitting: Family Medicine

## 2011-05-28 VITALS — BP 130/80 | HR 79 | Temp 98.4°F | Wt 292.2 lb

## 2011-05-28 DIAGNOSIS — E119 Type 2 diabetes mellitus without complications: Secondary | ICD-10-CM

## 2011-05-28 DIAGNOSIS — E782 Mixed hyperlipidemia: Secondary | ICD-10-CM

## 2011-05-28 DIAGNOSIS — I10 Essential (primary) hypertension: Secondary | ICD-10-CM

## 2011-05-28 LAB — COMPREHENSIVE METABOLIC PANEL
ALT: 23 U/L (ref 0–35)
AST: 15 U/L (ref 0–37)
Alkaline Phosphatase: 76 U/L (ref 39–117)
Sodium: 132 mEq/L — ABNORMAL LOW (ref 135–145)
Total Bilirubin: 0.3 mg/dL (ref 0.3–1.2)
Total Protein: 7 g/dL (ref 6.0–8.3)

## 2011-05-28 MED ORDER — INSULIN GLARGINE 100 UNIT/ML ~~LOC~~ SOLN: 60.0000 [IU] | Freq: Every day | SUBCUTANEOUS | Status: DC

## 2011-05-28 MED ORDER — SIMVASTATIN 20 MG PO TABS: 20.0000 mg | ORAL_TABLET | Freq: Every day | ORAL | Status: DC

## 2011-05-28 MED ORDER — TELMISARTAN 80 MG PO TABS: 80.0000 mg | ORAL_TABLET | Freq: Every day | ORAL | Status: DC

## 2011-05-28 MED ORDER — METFORMIN HCL 1000 MG PO TABS: 1000.0000 mg | ORAL_TABLET | Freq: Two times a day (BID) | ORAL | Status: DC

## 2011-05-28 NOTE — Assessment & Plan Note (Signed)
Not optimal control Ref to DM ed  Inc lantus to 50 and then 60 units and update Start exercise  F/u 3 mo

## 2011-05-28 NOTE — Assessment & Plan Note (Signed)
bp in fair control at this time  No changes needed  Disc lifstyle change with low sodium diet and exercise   

## 2011-05-28 NOTE — Patient Instructions (Addendum)
We will refer you to diabetic teaching at check out  Increase your lantus to 50 units daily for a week and then 60 units -- and then call or send blood sugars  Labs today  Follow up in 3 months  Aim for exercise at least 5 days per week

## 2011-05-28 NOTE — Progress Notes (Signed)
Subjective:    Patient ID: [name], female    DOB: 1965-10-18, 46 y.o.   MRN: [id number]  HPI Here for f/u of chronic conditions Has been doing well overall  Busy working and taking care of grandchild   Some nosebleeds - had to go to ER once- no cauterization   bp is 130/80     Today No cp or palpitations or headaches or edema  No side effects to medicines    mobid obesity No change  bmi is 48 Health habits   Diabetes Home sugar results - not good - 225-280  DM diet - not doing good , has not done DM teaching  (did do consult for gastric bypass- and the psychologic consult did not go well) - also considered poss of complications  Exercise - no exercise at all  Symptoms A1C last  Lab Results  Component Value Date   HGBA1C 10.7* 02/04/2011    No problems with medications --- metformin and lantus 40 units daily  Renal protection- on ARB Last eye exam -within the year   Saw pulm Dx with allergies  Breathing is under better control  Zyrtec one daily , also flonase - no more advair   Lipids Lab Results  Component Value Date   CHOL 151 02/04/2011   HDL 35.30* 02/04/2011   LDLCALC 95 04/27/2010   LDLDIRECT 100.0 02/04/2011   TRIG 239.0* 02/04/2011   CHOLHDL 4 02/04/2011   zocor and diet     Patient Active Problem List  Diagnoses  . THYROID DISORDER  . DIABETES MELLITUS, TYPE II  . POLYCYSTIC OVARIES  . HYPERLIPIDEMIA  . ANEMIA-IRON DEFICIENCY  . DEPRESSION  . OTHER ORGANIC SLEEP DISORDERS  . HYPERTENSION  . HEMORRHOIDS, INTERNAL, WITH BLEEDING  . EXTERNAL HEMORRHOIDS  . ASTHMA  . GERD  . OBESITY, MORBID  . Chronic allergic rhinitis  . Insomnia   Past Medical History  Diagnosis Date  . Anemia     iron deficiency  . Asthma   . Depression   . Diabetes mellitus   . Hypertension   . Obesity   . Hyperlipidemia   . Vertigo   . PCOS (polycystic ovarian syndrome)   . Gastritis   . Insomnia    Past Surgical History  Procedure Date  .  Cholecystectomy   . Hemorrhoid surgery   . Tubal ligation   . Shoulder surgery 06/2009  . Tonsillectomy and adenoidectomy CHILD  . Intrauterine device insertion 2011   History  Substance Use Topics  . Smoking status: Former Smoker -- 0.2 packs/day for 5 years    Quit date: 03/25/2004  . Smokeless tobacco: Never Used  . Alcohol Use: No   Family History  Problem Relation Age of Onset  . Cancer Father     colon cancer  . Arthritis Father   . Arthritis Mother   . Asthma Mother   . Hypertension Mother    Allergies  Allergen Reactions  . Buspirone Hcl Nausea Only  . Glipizide     REACTION: sleepy, low sugar  . Oxycodone-Acetaminophen Itching   Current Outpatient Prescriptions on File Prior to Visit  Medication Sig Dispense Refill  . albuterol (PROAIR HFA) 108 (90 BASE) MCG/ACT inhaler Inhale 2 puffs into the lungs every 4 (four) hours as needed. For wheezing      . buPROPion (WELLBUTRIN XL) 150 MG 24 hr tablet Take 150 mg by mouth daily.        . cetirizine (ZYRTEC) 10 MG tablet  Take 10 mg by mouth at bedtime.        . ferrous sulfate 325 (65 FE) MG tablet Take 325 mg by mouth 2 (two) times daily.        . fluticasone (FLONASE) 50 MCG/ACT nasal spray Place 2 sprays into the nose 2 (two) times daily as needed. For nasal congestion       . glimepiride (AMARYL) 4 MG tablet Take 4 mg by mouth daily before breakfast.        . meclizine (ANTIVERT) 25 MG tablet Take 25 mg by mouth 2 (two) times daily as needed. For vertigo      . Multiple Vitamin (MULTIVITAMIN) capsule Take 1 capsule by mouth at bedtime.       . Omega-3 Fatty Acids (FISH OIL PO) Take 2 capsules by mouth daily.        . pantoprazole (PROTONIX) 40 MG tablet Take 40 mg by mouth daily.        . sertraline (ZOLOFT) 50 MG tablet TAKE ONE BY MOUTH AT BEDTIME  30 tablet  1  . zolpidem (AMBIEN) 10 MG tablet Take 5-10 mg by mouth at bedtime as needed. For sleep         Review of Systems Review of Systems  Constitutional:  Negative for fever, appetite change,  and unexpected weight change. pos for fatigue  Eyes: Negative for pain and visual disturbance.  Respiratory: Negative for cough and shortness of breath.  pos for generally poor exercise tolerance Cardiovascular: Negative for cp or palpitations    Gastrointestinal: Negative for nausea, diarrhea and constipation.  Genitourinary: Negative for urgency and frequency. pos for thirst  Skin: Negative for pallor or rash   Neurological: Negative for weakness, light-headedness, numbness and headaches.  Hematological: Negative for adenopathy. Does not bruise/bleed easily.  Psychiatric/Behavioral: Negative for dysphoric mood. The patient is not nervous/anxious.         Objective:   Physical Exam  Constitutional: She appears well-developed and well-nourished. No distress.       Morbidly obese and well appearing   HENT:  Head: Normocephalic and atraumatic.  Right Ear: External ear normal.  Left Ear: External ear normal.  Nose: Nose normal.  Mouth/Throat: Oropharynx is clear and moist. No oropharyngeal exudate.       Nares are boggy  Eyes: Conjunctivae and EOM are normal. Pupils are equal, round, and reactive to light. Right eye exhibits no discharge. Left eye exhibits no discharge. No scleral icterus.  Neck: Normal range of motion. Neck supple. No JVD present. Carotid bruit is not present. No thyromegaly present.  Cardiovascular: Normal rate, regular rhythm, normal heart sounds and intact distal pulses.  Exam reveals no gallop.   Pulmonary/Chest: Effort normal and breath sounds normal. No respiratory distress. She has no wheezes. She exhibits no tenderness.  Abdominal: Soft. Bowel sounds are normal. She exhibits no distension, no abdominal bruit and no mass. There is no tenderness.  Musculoskeletal: Normal range of motion. She exhibits edema. She exhibits no tenderness.       Trace to one plus pedal edema  Lymphadenopathy:    She has no cervical adenopathy.    Neurological: She is alert. She has normal reflexes. No cranial nerve deficit. She exhibits normal muscle tone. Coordination normal.  Skin: Skin is warm and dry. No rash noted. No erythema. No pallor.  Psychiatric: She has a normal mood and affect.          Assessment & Plan:

## 2011-05-28 NOTE — Assessment & Plan Note (Signed)
Rev last lipids Fair with zocor and diet Exp trig to dec with better sugar F/u 3 mo  zocor refilled

## 2011-05-28 NOTE — Assessment & Plan Note (Signed)
Discussed how this problem influences overall health and the risks it imposes  Reviewed plan for weight loss with lower calorie diet (via better food choices and also portion control or program like weight watchers) and exercise building up to or more than 30 minutes 5 days per week including some aerobic activity    Pt decided against bariatric sx Needs to get hold of emot eating/ stress

## 2011-05-30 ENCOUNTER — Encounter: Payer: Self-pay | Admitting: *Deleted

## 2011-06-13 ENCOUNTER — Other Ambulatory Visit: Payer: Self-pay | Admitting: Family Medicine

## 2011-06-14 NOTE — Telephone Encounter (Signed)
[facility] request refill Wellbutrin XL 150 mg #30 x 11 and Pantoprazole 40 mg #30 x 11.

## 2011-07-16 ENCOUNTER — Other Ambulatory Visit: Payer: Self-pay | Admitting: Family Medicine

## 2011-07-17 ENCOUNTER — Other Ambulatory Visit: Payer: Self-pay | Admitting: *Deleted

## 2011-07-17 MED ORDER — GLUCOSE BLOOD VI STRP: ORAL_STRIP | Status: DC

## 2011-07-17 NOTE — Telephone Encounter (Signed)
Will refill electronically  

## 2011-07-17 NOTE — Telephone Encounter (Signed)
Received faxed refill request from pharmacy. Refill sent to pharmacy electronically. 

## 2011-08-16 ENCOUNTER — Other Ambulatory Visit: Payer: Self-pay | Admitting: Family Medicine

## 2011-08-30 ENCOUNTER — Ambulatory Visit (INDEPENDENT_AMBULATORY_CARE_PROVIDER_SITE_OTHER): Payer: BC Managed Care – PPO | Admitting: Family Medicine

## 2011-08-30 ENCOUNTER — Encounter: Payer: Self-pay | Admitting: Family Medicine

## 2011-08-30 VITALS — BP 140/88 | HR 85 | Temp 98.1°F | Ht 65.0 in | Wt 300.2 lb

## 2011-08-30 DIAGNOSIS — I10 Essential (primary) hypertension: Secondary | ICD-10-CM

## 2011-08-30 DIAGNOSIS — F430 Acute stress reaction: Secondary | ICD-10-CM

## 2011-08-30 DIAGNOSIS — F438 Other reactions to severe stress: Secondary | ICD-10-CM

## 2011-08-30 DIAGNOSIS — E119 Type 2 diabetes mellitus without complications: Secondary | ICD-10-CM

## 2011-08-30 DIAGNOSIS — F4389 Other reactions to severe stress: Secondary | ICD-10-CM

## 2011-08-30 MED ORDER — ZOLPIDEM TARTRATE 10 MG PO TABS: 5.0000 mg | ORAL_TABLET | Freq: Every evening | ORAL | Status: DC | PRN

## 2011-08-30 MED ORDER — SERTRALINE HCL 50 MG PO TABS: 100.0000 mg | ORAL_TABLET | Freq: Every day | ORAL | Status: DC

## 2011-08-30 NOTE — Assessment & Plan Note (Signed)
With stress eating expect no imp a1c today Then will inc lantus likely Glad she did DM teaching - pleased with that

## 2011-08-30 NOTE — Assessment & Plan Note (Signed)
bp in fair control at this time  No changes needed  Disc lifstyle change with low sodium diet and exercise   

## 2011-08-30 NOTE — Progress Notes (Signed)
Subjective:    Patient ID: [name], female    DOB: 07-Aug-1965, 46 y.o.   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 , finished that  Is really thrilled about that - learned a lot  Then found out she was loosing her job  Is severely stressed and tearful and cannot sleep Is taking care of her mother Son had a DUI and now she has to drive him to work  Husband is sick too - being worked up for blood disorder  She fears the worse Is very very anxious  Panic attacks  Jittery and tearful  Wants to sleep and cannot   Wt is up 8lb bmi is 49   Diabetes Home sugar results - last 30 days avg is 233  DM diet - is very bad / stress eating with all the stress  Exercise none Symptoms some thirst  A1C last - wants to check a1c today  No problems with medications - is on lantus 60 units  Renal protection- on ARB     bp is   140/88  Today No cp or palpitations or headaches or edema  No side effects to medicines    Patient Active Problem List  Diagnoses  . THYROID DISORDER  . DIABETES MELLITUS, TYPE II  . POLYCYSTIC OVARIES  . HYPERLIPIDEMIA  . ANEMIA-IRON DEFICIENCY  . DEPRESSION  . OTHER ORGANIC SLEEP DISORDERS  . HYPERTENSION  . HEMORRHOIDS, INTERNAL, WITH BLEEDING  . EXTERNAL HEMORRHOIDS  . ASTHMA  . GERD  . OBESITY, MORBID  . Chronic allergic rhinitis  . Insomnia  . Stress reaction   Past Medical History  Diagnosis Date  . Anemia     iron deficiency  . Asthma   . Depression   . Diabetes mellitus   . Hypertension   . Obesity   . Hyperlipidemia   . Vertigo   . PCOS (polycystic ovarian syndrome)   . Gastritis   . Insomnia    Past Surgical History  Procedure Date  . Cholecystectomy   . Hemorrhoid surgery   . Tubal ligation   . Shoulder surgery 06/2009  . Tonsillectomy and adenoidectomy CHILD  . Intrauterine device insertion 2011   History  Substance Use Topics  .  Smoking status: Former Smoker -- 0.2 packs/day for 5 years    Quit date: 03/25/2004  . Smokeless tobacco: Never Used  . Alcohol Use: No   Family History  Problem Relation Age of Onset  . Cancer Father     colon cancer  . Arthritis Father   . Arthritis Mother   . Asthma Mother   . Hypertension Mother    Allergies  Allergen Reactions  . Buspirone Hcl Nausea Only  . Glipizide     REACTION: sleepy, low sugar  . Oxycodone-Acetaminophen Itching   Current Outpatient Prescriptions on File Prior to Visit  Medication Sig Dispense Refill  . albuterol (PROAIR HFA) 108 (90 BASE) MCG/ACT inhaler Inhale 2 puffs into the lungs every 4 (four) hours as needed. For wheezing      . B-D ULTRAFINE III SHORT PEN 31G X 8 MM MISC USE 1 DAILY  100 each  11  . buPROPion (WELLBUTRIN XL) 150 MG 24 hr tablet TAKE 1 TABLET BY MOUTH EVERY DAY  30 tablet  11  . cetirizine (ZYRTEC) 10 MG tablet Take 10 mg by mouth at bedtime.        Marland Kitchen  ferrous sulfate 325 (65 FE) MG tablet Take 325 mg by mouth 2 (two) times daily.        . fluticasone (FLONASE) 50 MCG/ACT nasal spray Place 2 sprays into the nose 2 (two) times daily as needed. For nasal congestion       . glimepiride (AMARYL) 4 MG tablet Take 4 mg by mouth daily before breakfast.        . glucose blood (ONE TOUCH TEST STRIPS) test strip Use as directed, check blood sugar twice a day, diagnosis 250.00  100 each  1  . insulin glargine (LANTUS SOLOSTAR) 100 UNIT/ML injection Inject 60 Units into the skin at bedtime.  10 mL  11  . meclizine (ANTIVERT) 25 MG tablet Take 25 mg by mouth 2 (two) times daily as needed. For vertigo      . metFORMIN (GLUCOPHAGE) 1000 MG tablet Take 1 tablet (1,000 mg total) by mouth 2 (two) times daily with a meal.  60 tablet  11  . metFORMIN (GLUCOPHAGE) 1000 MG tablet TAKE 1 TABLET TWICE A DAY  60 tablet  11  . Multiple Vitamin (MULTIVITAMIN) capsule Take 1 capsule by mouth at bedtime.       . Omega-3 Fatty Acids (FISH OIL PO) Take 2  capsules by mouth daily.        . pantoprazole (PROTONIX) 40 MG tablet TAKE 1 TABLET BY MOUTH EVERY DAY  30 tablet  11  . sertraline (ZOLOFT) 50 MG tablet Take 2 tablets (100 mg total) by mouth daily.  30 tablet  11  . simvastatin (ZOCOR) 20 MG tablet 1 BY MOUTH ONCE DAILY IN EVENING  30 tablet  11  . telmisartan (MICARDIS) 80 MG tablet Take 1 tablet (80 mg total) by mouth daily.  30 tablet  11  . zolpidem (AMBIEN) 10 MG tablet Take 0.5-1 tablets (5-10 mg total) by mouth at bedtime as needed. For sleep  30 tablet  3        Review of Systems Review of Systems  Constitutional: Negative for fever, appetite change, fatigue and unexpected weight change.  Eyes: Negative for pain and visual disturbance.  Respiratory: Negative for cough and shortness of breath.   Cardiovascular: Negative for cp or palpitations    Gastrointestinal: Negative for nausea, diarrhea and constipation.  Genitourinary: Negative for urgency and frequency.  Skin: Negative for pallor or rash   Neurological: Negative for weakness, light-headedness, numbness and headaches.  Hematological: Negative for adenopathy. Does not bruise/bleed easily.  Psychiatric/Behavioral: pos for anxiety and dysphoric mood         Objective:   Physical Exam  Constitutional: She appears well-developed and well-nourished. No distress.       Morbidly obese and fatigued/anx appearing   HENT:  Head: Normocephalic and atraumatic.  Mouth/Throat: Oropharynx is clear and moist.  Eyes: Conjunctivae and EOM are normal. Pupils are equal, round, and reactive to light. No scleral icterus.  Neck: Normal range of motion. Neck supple. No JVD present. Carotid bruit is not present. No thyromegaly present.  Cardiovascular: Normal rate, regular rhythm, normal heart sounds and intact distal pulses.  Exam reveals no gallop.   Pulmonary/Chest: Effort normal and breath sounds normal. No respiratory distress. She has no wheezes.  Abdominal: Soft. Bowel sounds are  normal. She exhibits no distension, no abdominal bruit and no mass. There is no tenderness.  Musculoskeletal: She exhibits no edema.  Lymphadenopathy:    She has no cervical adenopathy.  Neurological: She is alert. No cranial nerve deficit.  She exhibits normal muscle tone. Coordination normal.  Skin: Skin is warm and dry. No rash noted. No erythema. No pallor.  Psychiatric: Her speech is normal and behavior is normal. Judgment normal. Her mood appears anxious. Her affect is not inappropriate. Thought content is not delusional. Cognition and memory are normal. She exhibits a depressed mood. She expresses no homicidal and no suicidal ideation.       Tearful at times , seems overwhelmed           Assessment & Plan:

## 2011-08-30 NOTE — Patient Instructions (Signed)
We will do counseling referral for stress reaction at check out  Increase zoloft to 100 mg once daily Use ambien as needed Try to work on diabetic diet  Labs today Follow up with me in about 3 months

## 2011-08-30 NOTE — Assessment & Plan Note (Signed)
From recent multiple stressors - rather severe, with panic attacks  / anx but positive attitude Inc zoloft to 100 as tolerated (disc poss side eff )  Ref to counseling (for this and emot eating)  ambien prn for sleep - works well  Disc exercise  F/u 3 mo or earlier if needed

## 2011-09-18 ENCOUNTER — Ambulatory Visit: Payer: BC Managed Care – PPO | Admitting: Psychology

## 2011-10-02 ENCOUNTER — Telehealth: Payer: Self-pay | Admitting: Family Medicine

## 2011-10-02 ENCOUNTER — Ambulatory Visit (INDEPENDENT_AMBULATORY_CARE_PROVIDER_SITE_OTHER): Payer: BC Managed Care – PPO | Admitting: Psychology

## 2011-10-02 DIAGNOSIS — F331 Major depressive disorder, recurrent, moderate: Secondary | ICD-10-CM

## 2011-10-02 NOTE — Telephone Encounter (Signed)
Please let pt know that Dr Laymond Purser briefly spoke to me about her and I want her to f/u with me when able to discuss her medication Thanks

## 2011-10-04 NOTE — Telephone Encounter (Signed)
Left message on pat vm to schedule appointment for F/U visit to discuss medication.

## 2011-11-29 ENCOUNTER — Ambulatory Visit: Payer: BC Managed Care – PPO | Admitting: Family Medicine

## 2012-01-16 ENCOUNTER — Emergency Department (HOSPITAL_COMMUNITY): Payer: BC Managed Care – PPO

## 2012-01-16 ENCOUNTER — Inpatient Hospital Stay (HOSPITAL_COMMUNITY)
Admission: EM | Admit: 2012-01-16 | Discharge: 2012-01-22 | DRG: 543 | Disposition: A | Discharge status: Home Health | Payer: BC Managed Care – PPO | Attending: Internal Medicine | Admitting: Internal Medicine

## 2012-01-16 ENCOUNTER — Encounter (HOSPITAL_COMMUNITY): Payer: Self-pay | Admitting: *Deleted

## 2012-01-16 ENCOUNTER — Inpatient Hospital Stay (HOSPITAL_COMMUNITY): Payer: BC Managed Care – PPO

## 2012-01-16 ENCOUNTER — Other Ambulatory Visit: Payer: Self-pay

## 2012-01-16 DIAGNOSIS — J309 Allergic rhinitis, unspecified: Secondary | ICD-10-CM

## 2012-01-16 DIAGNOSIS — K219 Gastro-esophageal reflux disease without esophagitis: Secondary | ICD-10-CM

## 2012-01-16 DIAGNOSIS — E662 Morbid (severe) obesity with alveolar hypoventilation: Secondary | ICD-10-CM | POA: Diagnosis present

## 2012-01-16 DIAGNOSIS — E876 Hypokalemia: Secondary | ICD-10-CM | POA: Diagnosis not present

## 2012-01-16 DIAGNOSIS — E1165 Type 2 diabetes mellitus with hyperglycemia: Secondary | ICD-10-CM | POA: Diagnosis present

## 2012-01-16 DIAGNOSIS — F329 Major depressive disorder, single episode, unspecified: Secondary | ICD-10-CM | POA: Diagnosis present

## 2012-01-16 DIAGNOSIS — F418 Other specified anxiety disorders: Secondary | ICD-10-CM | POA: Diagnosis present

## 2012-01-16 DIAGNOSIS — I161 Hypertensive emergency: Secondary | ICD-10-CM

## 2012-01-16 DIAGNOSIS — F419 Anxiety disorder, unspecified: Secondary | ICD-10-CM

## 2012-01-16 DIAGNOSIS — Z6841 Body Mass Index (BMI) 40.0 and over, adult: Secondary | ICD-10-CM

## 2012-01-16 DIAGNOSIS — E873 Alkalosis: Secondary | ICD-10-CM | POA: Diagnosis not present

## 2012-01-16 DIAGNOSIS — E079 Disorder of thyroid, unspecified: Secondary | ICD-10-CM

## 2012-01-16 DIAGNOSIS — Z794 Long term (current) use of insulin: Secondary | ICD-10-CM

## 2012-01-16 DIAGNOSIS — R040 Epistaxis: Secondary | ICD-10-CM | POA: Diagnosis not present

## 2012-01-16 DIAGNOSIS — R079 Chest pain, unspecified: Secondary | ICD-10-CM

## 2012-01-16 DIAGNOSIS — I498 Other specified cardiac arrhythmias: Secondary | ICD-10-CM | POA: Diagnosis not present

## 2012-01-16 DIAGNOSIS — G4700 Insomnia, unspecified: Secondary | ICD-10-CM | POA: Diagnosis present

## 2012-01-16 DIAGNOSIS — I160 Hypertensive urgency: Secondary | ICD-10-CM | POA: Diagnosis present

## 2012-01-16 DIAGNOSIS — E1142 Type 2 diabetes mellitus with diabetic polyneuropathy: Secondary | ICD-10-CM | POA: Diagnosis present

## 2012-01-16 DIAGNOSIS — F411 Generalized anxiety disorder: Secondary | ICD-10-CM | POA: Diagnosis present

## 2012-01-16 DIAGNOSIS — E782 Mixed hyperlipidemia: Secondary | ICD-10-CM | POA: Diagnosis present

## 2012-01-16 DIAGNOSIS — E049 Nontoxic goiter, unspecified: Secondary | ICD-10-CM | POA: Diagnosis present

## 2012-01-16 DIAGNOSIS — G4733 Obstructive sleep apnea (adult) (pediatric): Secondary | ICD-10-CM

## 2012-01-16 DIAGNOSIS — E282 Polycystic ovarian syndrome: Secondary | ICD-10-CM | POA: Diagnosis present

## 2012-01-16 DIAGNOSIS — E1140 Type 2 diabetes mellitus with diabetic neuropathy, unspecified: Secondary | ICD-10-CM | POA: Diagnosis present

## 2012-01-16 DIAGNOSIS — E119 Type 2 diabetes mellitus without complications: Secondary | ICD-10-CM | POA: Diagnosis present

## 2012-01-16 DIAGNOSIS — R072 Precordial pain: Secondary | ICD-10-CM

## 2012-01-16 DIAGNOSIS — I10 Essential (primary) hypertension: Principal | ICD-10-CM | POA: Diagnosis present

## 2012-01-16 DIAGNOSIS — E041 Nontoxic single thyroid nodule: Secondary | ICD-10-CM

## 2012-01-16 DIAGNOSIS — E1169 Type 2 diabetes mellitus with other specified complication: Secondary | ICD-10-CM | POA: Diagnosis present

## 2012-01-16 DIAGNOSIS — J209 Acute bronchitis, unspecified: Secondary | ICD-10-CM

## 2012-01-16 DIAGNOSIS — I5031 Acute diastolic (congestive) heart failure: Secondary | ICD-10-CM | POA: Diagnosis present

## 2012-01-16 DIAGNOSIS — Z23 Encounter for immunization: Secondary | ICD-10-CM

## 2012-01-16 DIAGNOSIS — Z79899 Other long term (current) drug therapy: Secondary | ICD-10-CM

## 2012-01-16 DIAGNOSIS — H9209 Otalgia, unspecified ear: Secondary | ICD-10-CM | POA: Diagnosis not present

## 2012-01-16 DIAGNOSIS — I509 Heart failure, unspecified: Secondary | ICD-10-CM | POA: Diagnosis present

## 2012-01-16 DIAGNOSIS — J45909 Unspecified asthma, uncomplicated: Secondary | ICD-10-CM | POA: Diagnosis present

## 2012-01-16 DIAGNOSIS — I517 Cardiomegaly: Secondary | ICD-10-CM | POA: Diagnosis present

## 2012-01-16 DIAGNOSIS — E785 Hyperlipidemia, unspecified: Secondary | ICD-10-CM | POA: Diagnosis present

## 2012-01-16 DIAGNOSIS — F3289 Other specified depressive episodes: Secondary | ICD-10-CM | POA: Diagnosis present

## 2012-01-16 HISTORY — DX: Adverse effect of unspecified anesthetic, initial encounter: T41.45XA

## 2012-01-16 HISTORY — DX: Gastro-esophageal reflux disease without esophagitis: K21.9

## 2012-01-16 HISTORY — DX: Other complications of anesthesia, initial encounter: T88.59XA

## 2012-01-16 HISTORY — DX: Morbid (severe) obesity due to excess calories: E66.01

## 2012-01-16 HISTORY — DX: Obstructive sleep apnea (adult) (pediatric): G47.33

## 2012-01-16 LAB — TROPONIN I: Troponin I: <0.3 ng/mL (ref <0.30)

## 2012-01-16 LAB — CBC
MCH: 27.2 pg (ref 26.0–34.0)
MCH: 27.2 pg (ref 26.0–34.0)
MCV: 86.8 fL (ref 78.0–100.0)
Platelets: 268 10*3/uL (ref 150–400)
Platelets: 287 10*3/uL (ref 150–400)
RBC: 4.34 MIL/uL (ref 3.87–5.11)
RBC: 4.08 MIL/uL (ref 3.87–5.11)
RDW: 15.2 % (ref 11.5–15.5)
RDW: 15.4 % (ref 11.5–15.5)
WBC: 10.7 10*3/uL — ABNORMAL HIGH (ref 4.0–10.5)

## 2012-01-16 LAB — BASIC METABOLIC PANEL
CO2: 29 mEq/L (ref 19–32)
Calcium: 9.2 mg/dL (ref 8.4–10.5)
Creatinine, Ser: 0.6 mg/dL (ref 0.50–1.10)
GFR calc Af Amer: >90 mL/min (ref >90)
Sodium: 138 mEq/L (ref 135–145)

## 2012-01-16 LAB — POCT I-STAT TROPONIN I: Troponin i, poc: 0.02 ng/mL (ref 0.00–0.08)

## 2012-01-16 LAB — D-DIMER, QUANTITATIVE: D-Dimer, Quant: 0.59 ug/mL-FEU — ABNORMAL HIGH (ref 0.00–0.48)

## 2012-01-16 LAB — GLUCOSE, CAPILLARY: Glucose-Capillary: 165 mg/dL — ABNORMAL HIGH (ref 70–99)

## 2012-01-16 LAB — PREGNANCY, URINE: Preg Test, Ur: NEGATIVE

## 2012-01-16 MED ORDER — IOHEXOL 350 MG/ML SOLN
80.0000 mL | Freq: Once | INTRAVENOUS | Status: AC | PRN
  Administered 2012-01-16: 80 mL via INTRAVENOUS

## 2012-01-16 MED ORDER — ADULT MULTIVITAMIN W/MINERALS CH
1.0000 | ORAL_TABLET | Freq: Every day | ORAL | Status: DC
  Administered 2012-01-16 – 2012-01-21 (×6): 1 via ORAL
  Filled 2012-01-16 (×8): qty 1

## 2012-01-16 MED ORDER — ALBUTEROL SULFATE HFA 108 (90 BASE) MCG/ACT IN AERS: 2.0000 | INHALATION_SPRAY | RESPIRATORY_TRACT | Status: DC | PRN

## 2012-01-16 MED ORDER — ACETAMINOPHEN 650 MG RE SUPP: 650.0000 mg | Freq: Four times a day (QID) | RECTAL | Status: DC | PRN

## 2012-01-16 MED ORDER — SODIUM CHLORIDE 0.9 % IV SOLN: 250.0000 mL | INTRAVENOUS | Status: DC | PRN

## 2012-01-16 MED ORDER — ENOXAPARIN SODIUM 40 MG/0.4ML ~~LOC~~ SOLN
40.0000 mg | SUBCUTANEOUS | Status: DC
  Administered 2012-01-16 – 2012-01-19 (×4): 40 mg via SUBCUTANEOUS
  Filled 2012-01-16 (×5): qty 0.4

## 2012-01-16 MED ORDER — SIMVASTATIN 20 MG PO TABS
20.0000 mg | ORAL_TABLET | Freq: Every evening | ORAL | Status: DC
  Administered 2012-01-16 – 2012-01-22 (×7): 20 mg via ORAL
  Filled 2012-01-16 (×7): qty 1

## 2012-01-16 MED ORDER — SODIUM CHLORIDE 0.9 % IJ SOLN
3.0000 mL | Freq: Two times a day (BID) | INTRAMUSCULAR | Status: DC
  Administered 2012-01-18: 3 mL via INTRAVENOUS

## 2012-01-16 MED ORDER — SODIUM CHLORIDE 0.9 % IJ SOLN: 3.0000 mL | INTRAMUSCULAR | Status: DC | PRN

## 2012-01-16 MED ORDER — ONDANSETRON HCL 4 MG PO TABS: 4.0000 mg | ORAL_TABLET | Freq: Four times a day (QID) | ORAL | Status: DC | PRN

## 2012-01-16 MED ORDER — ALUM & MAG HYDROXIDE-SIMETH 200-200-20 MG/5ML PO SUSP: 30.0000 mL | Freq: Four times a day (QID) | ORAL | Status: DC | PRN

## 2012-01-16 MED ORDER — LORATADINE 10 MG PO TABS
10.0000 mg | ORAL_TABLET | Freq: Every day | ORAL | Status: DC
  Administered 2012-01-16 – 2012-01-22 (×7): 10 mg via ORAL
  Filled 2012-01-16 (×7): qty 1

## 2012-01-16 MED ORDER — ZOLPIDEM TARTRATE 5 MG PO TABS
5.0000 mg | ORAL_TABLET | Freq: Every evening | ORAL | Status: DC | PRN
  Administered 2012-01-16: 10 mg via ORAL
  Filled 2012-01-16: qty 2

## 2012-01-16 MED ORDER — INSULIN ASPART 100 UNIT/ML ~~LOC~~ SOLN
0.0000 [IU] | Freq: Three times a day (TID) | SUBCUTANEOUS | Status: DC
  Administered 2012-01-16 – 2012-01-17 (×2): 3 [IU] via SUBCUTANEOUS
  Administered 2012-01-17: 5 [IU] via SUBCUTANEOUS
  Administered 2012-01-17: 3 [IU] via SUBCUTANEOUS
  Administered 2012-01-18: 8 [IU] via SUBCUTANEOUS
  Administered 2012-01-18 (×2): 2 [IU] via SUBCUTANEOUS
  Administered 2012-01-19: 8 [IU] via SUBCUTANEOUS
  Administered 2012-01-19: 3 [IU] via SUBCUTANEOUS
  Administered 2012-01-20: 8 [IU] via SUBCUTANEOUS
  Administered 2012-01-20: 2 [IU] via SUBCUTANEOUS
  Administered 2012-01-21 (×2): 5 [IU] via SUBCUTANEOUS
  Administered 2012-01-22 (×2): 3 [IU] via SUBCUTANEOUS
  Administered 2012-01-22: 2 [IU] via SUBCUTANEOUS

## 2012-01-16 MED ORDER — BUPROPION HCL ER (XL) 150 MG PO TB24
150.0000 mg | ORAL_TABLET | Freq: Every day | ORAL | Status: DC
  Administered 2012-01-16 – 2012-01-22 (×7): 150 mg via ORAL
  Filled 2012-01-16 (×7): qty 1

## 2012-01-16 MED ORDER — LABETALOL HCL 5 MG/ML IV SOLN
10.0000 mg | INTRAVENOUS | Status: DC | PRN
  Administered 2012-01-16 – 2012-01-17 (×2): 10 mg via INTRAVENOUS
  Filled 2012-01-16 (×3): qty 4

## 2012-01-16 MED ORDER — IRBESARTAN 150 MG PO TABS
150.0000 mg | ORAL_TABLET | Freq: Every day | ORAL | Status: DC
  Administered 2012-01-16 – 2012-01-17 (×2): 150 mg via ORAL
  Filled 2012-01-16 (×2): qty 1

## 2012-01-16 MED ORDER — OMEGA-3-ACID ETHYL ESTERS 1 G PO CAPS
1.0000 g | ORAL_CAPSULE | Freq: Two times a day (BID) | ORAL | Status: DC
  Administered 2012-01-16 – 2012-01-17 (×3): 1 g via ORAL
  Filled 2012-01-16 (×6): qty 1

## 2012-01-16 MED ORDER — FERROUS SULFATE 325 (65 FE) MG PO TABS
325.0000 mg | ORAL_TABLET | Freq: Two times a day (BID) | ORAL | Status: DC
  Administered 2012-01-16 – 2012-01-22 (×13): 325 mg via ORAL
  Filled 2012-01-16 (×15): qty 1

## 2012-01-16 MED ORDER — ONDANSETRON HCL 4 MG/2ML IJ SOLN
4.0000 mg | Freq: Once | INTRAMUSCULAR | Status: AC
  Administered 2012-01-16: 4 mg via INTRAVENOUS
  Filled 2012-01-16: qty 2

## 2012-01-16 MED ORDER — FUROSEMIDE 10 MG/ML IJ SOLN
40.0000 mg | Freq: Two times a day (BID) | INTRAMUSCULAR | Status: DC
  Administered 2012-01-16: 40 mg via INTRAVENOUS

## 2012-01-16 MED ORDER — NITROGLYCERIN 0.4 MG SL SUBL
0.4000 mg | SUBLINGUAL_TABLET | SUBLINGUAL | Status: AC | PRN
  Administered 2012-01-16 (×3): 0.4 mg via SUBLINGUAL
  Filled 2012-01-16 (×2): qty 25

## 2012-01-16 MED ORDER — INFLUENZA VIRUS VACC SPLIT PF IM SUSP
0.5000 mL | INTRAMUSCULAR | Status: AC
  Administered 2012-01-17: 0.5 mL via INTRAMUSCULAR
  Filled 2012-01-16: qty 0.5

## 2012-01-16 MED ORDER — SODIUM CHLORIDE 0.9 % IJ SOLN
3.0000 mL | Freq: Two times a day (BID) | INTRAMUSCULAR | Status: DC
  Administered 2012-01-17 – 2012-01-22 (×8): 3 mL via INTRAVENOUS

## 2012-01-16 MED ORDER — MULTIVITAMINS PO CAPS: 1.0000 | ORAL_CAPSULE | Freq: Every day | ORAL | Status: DC

## 2012-01-16 MED ORDER — PANTOPRAZOLE SODIUM 40 MG PO TBEC
40.0000 mg | DELAYED_RELEASE_TABLET | Freq: Every day | ORAL | Status: DC
  Administered 2012-01-16 – 2012-01-17 (×2): 40 mg via ORAL
  Filled 2012-01-16 (×2): qty 1

## 2012-01-16 MED ORDER — ACETAMINOPHEN 325 MG PO TABS
650.0000 mg | ORAL_TABLET | Freq: Four times a day (QID) | ORAL | Status: DC | PRN
  Administered 2012-01-16 – 2012-01-18 (×5): 650 mg via ORAL
  Filled 2012-01-16 (×5): qty 2

## 2012-01-16 MED ORDER — ALBUTEROL SULFATE (5 MG/ML) 0.5% IN NEBU
2.5000 mg | INHALATION_SOLUTION | RESPIRATORY_TRACT | Status: DC | PRN
  Filled 2012-01-16 (×2): qty 0.5

## 2012-01-16 MED ORDER — LABETALOL HCL 5 MG/ML IV SOLN
20.0000 mg | Freq: Once | INTRAVENOUS | Status: AC
  Administered 2012-01-16: 20 mg via INTRAVENOUS
  Filled 2012-01-16: qty 4

## 2012-01-16 MED ORDER — FUROSEMIDE 10 MG/ML IJ SOLN
40.0000 mg | Freq: Every day | INTRAMUSCULAR | Status: DC
Start: 2012-01-17 — End: 2012-01-17
  Filled 2012-01-16: qty 4

## 2012-01-16 MED ORDER — GI COCKTAIL ~~LOC~~: 30.0000 mL | Freq: Three times a day (TID) | ORAL | Status: DC | PRN

## 2012-01-16 MED ORDER — ASPIRIN EC 325 MG PO TBEC: 325.0000 mg | DELAYED_RELEASE_TABLET | Freq: Every day | ORAL | Status: DC

## 2012-01-16 MED ORDER — ACETAMINOPHEN 325 MG PO TABS
650.0000 mg | ORAL_TABLET | Freq: Once | ORAL | Status: AC
  Administered 2012-01-16: 650 mg via ORAL
  Filled 2012-01-16: qty 2

## 2012-01-16 MED ORDER — INSULIN GLARGINE 100 UNIT/ML ~~LOC~~ SOLN
60.0000 [IU] | Freq: Every day | SUBCUTANEOUS | Status: DC
  Administered 2012-01-16: 60 [IU] via SUBCUTANEOUS

## 2012-01-16 MED ORDER — SERTRALINE HCL 100 MG PO TABS
100.0000 mg | ORAL_TABLET | Freq: Every day | ORAL | Status: DC
  Administered 2012-01-16 – 2012-01-22 (×7): 100 mg via ORAL
  Filled 2012-01-16 (×7): qty 1

## 2012-01-16 MED ORDER — IBUPROFEN 600 MG PO TABS
600.0000 mg | ORAL_TABLET | Freq: Once | ORAL | Status: AC
  Administered 2012-01-17: 600 mg via ORAL
  Filled 2012-01-16 (×3): qty 1

## 2012-01-16 MED ORDER — ONDANSETRON HCL 4 MG/2ML IJ SOLN
4.0000 mg | Freq: Four times a day (QID) | INTRAMUSCULAR | Status: DC | PRN
  Administered 2012-01-17: 4 mg via INTRAVENOUS
  Filled 2012-01-16: qty 2

## 2012-01-16 MED ORDER — NITROGLYCERIN 0.4 MG SL SUBL: 0.4000 mg | SUBLINGUAL_TABLET | SUBLINGUAL | Status: DC | PRN

## 2012-01-16 NOTE — Consult Note (Signed)
CARDIOLOGY CONSULT NOTE  Patient ID: [name], MRN: [id number], DOB/AGE: 46-Oct-1967 46 y.o. Admit date: 01/16/2012   Date of Consult: 01/16/2012 Primary Physician: Roxy Manns, MD Primary Cardiologist: seen by Dr. Abigail Miyamoto remotely in 2006  Chief Complaint: SOB, chest pain Reason for Consult: ?CHF  HPI: [name] is a 46 y/o F who works in the Radio producer at [employer]. She has a hx of DM, HTN, HL, morbid obesity, OSA noncompliant with CPAP due to finances, anxiety whose cardiac hx includes absence of coronary artery disease on cath in 2006 for CP with only catheter-induced spasm of RCA. EF was 65% at that time. She reports med compliance, but does note that her DM is uncontrolled and that BP runs very high. She notes hx of asthma (fairly unremarkable PFTs 11/2010).   About a week ago she had a flare of her usual fall allergy sx of rhinitis, nasal congestion and itchy eyes with slightly productive cough. Two days ago she began to develop progressive orthopnea and SOB even with minimal activity. She reports ?15lb weight gain since June - weights in Epic: 304 today, 300 in June 2013, 292 in March 2013. Endorses abdominal bloating and early satiety. She has daily rectal bleeding that can be dark, bright, +/- clots with each bowel movement ("it's almost like my period sometimes"). Has family hx of colon CA in her father - colonoscopy for rectal bleeding in 2011 showed int/external hemorrhoids and small colon polyps. She also describes a specific substernal chest/epigastric discomfort that is reproducible as a "soreness" on palpation. However, she also feels this pain gets worse with exertion like walking up stairs. No syncope, sustained palpitations, nausea, vomiting, fevers, or rigors. Occasional palpitation like a "thump" (has occ PVC on tele). Has had mild LEE and some night sweats. Does not exercise and does not report recent increase in fluid or salt intake. In ED, initial BP  peak 220/122, P108. She received 20mg  IV labetalol, 4mg  IV Zofran, 3 SL NTG, and tylenol in the ED. pBNP 503. POC troponin neg x1. ?glu 212, ?WBC 10.7, ?Hgb 11.8. CXR shows cardiomegaly and pulm vasc congestion but otherwise no acute finding. Most recent BP 148/92.  IM team is admitting and plans to add IV Lasix and continuing lasix. 2D echo ordered. Enzymes will be cycled.  Past Medical History  Diagnosis Date  . Anemia     iron deficiency  . Asthma     a. 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  . Depression     a. Stress reaction 08/2011 in multiple social stressors.  . Diabetes mellitus   . Hypertension   . Morbid obesity   . Hyperlipidemia   . Vertigo   . PCOS (polycystic ovarian syndrome)   . Gastritis   . Insomnia   . OSA (obstructive sleep apnea)     a. Severe by PSG 2012.     Most Recent Cardiac Studies: Cardiac Cath 09/2004 INDICATIONS: [name] is a 46 year old woman with a history of  hypertension, obesity and recent chest discomfort. This test is requested to  evaluate the coronary anatomy and assess for any potential  revascularizations strategies.  PROCEDURES PERFORMED:  1. Left heart catheterization.  2. Selective coronary angiography.  3. Left ventriculography.  ACCESS AND EQUIPMENT: The area about the right femoral artery was anesthetized 1% lidocaine. A 6-French sheath was placed in the right femoral artery via modified Seldinger technique. Standard preformed 6-French JL-4 and JR-4  catheters were used for selective coronary angiography and an angled pigtail catheter was used for left heart catheterization and left ventriculography. All exchanges were made over wire. The patient tolerated  the procedure well without any complications. Marland Kitchen  HEMODYNAMIC RESULTS: Left ventricle 131/15. Aorta 131/80.  ANGIOGRAPHIC FINDINGS:  1. Left main coronary artery is free of significant flow-limiting coronary atherosclerosis.  2. The left  anterior descending is a medium to large caliber vessel with one large bifurcating diagonal branch noted proximally. There is no significant flow-limiting coronary atherosclerosis noted.  3. The left circumflex is large and dominant. There is also a large ramus intermedius branch. No significant flow-limiting coronary atherosclerosis noted.  4. Right coronary artery is relatively small and nondominant. They are two medium sized right ventricular branch is noted. No flow-limiting coronary atherosclerosis noted. The patient did have some catheter induced spasm of the right coronary artery which resolved with 150 micrograms of intracoronary nitroglycerin.  Left ventriculography was performed in the RAO projection and revealed ejection fraction approximately 65% with trace mitral regurgitation in the setting of ventricular ectopy.  DIAGNOSES:  1. No significant flow-limiting coronary atherosclerosis noted within the major epicardial vessels. There was some catheter-induced spasm of the right coronary artery which improved with intracoronary nitroglycerin. Doubt that this is clinically significant and more related to catheter manipulation.  2. Left ventricular ejection fraction approximately 65%. The left ventricular end-diastolic pressure of 15 mmHg and trace mitral regurgitation in the setting of ventricular ectopy.   Surgical History:  Past Surgical History  Procedure Date  . Cholecystectomy   . Hemorrhoid surgery   . Tubal ligation   . Shoulder surgery 06/2009  . Tonsillectomy and adenoidectomy CHILD  . Intrauterine device insertion 2011    Home Meds: Prior to Admission medications   Medication Sig Start Date End Date Taking? Authorizing Provider  albuterol (PROAIR HFA) 108 (90 BASE) MCG/ACT inhaler Inhale 2 puffs into the lungs every 4 (four) hours as needed. For wheezing   Yes Historical Provider, MD  buPROPion (WELLBUTRIN XL) 150 MG 24 hr tablet Take 150 mg by mouth daily.   Yes Historical  Provider, MD  cetirizine (ZYRTEC) 10 MG tablet Take 10 mg by mouth at bedtime.     Yes Historical Provider, MD  ferrous sulfate 325 (65 FE) MG tablet Take 325 mg by mouth 2 (two) times daily.     Yes Historical Provider, MD  glimepiride (AMARYL) 4 MG tablet Take 4 mg by mouth daily before breakfast.   01/21/11  Yes Judy Pimple, MD  insulin glargine (LANTUS SOLOSTAR) 100 UNIT/ML injection Inject 60 Units into the skin at bedtime. 05/28/11  Yes Judy Pimple, MD  metFORMIN (GLUCOPHAGE) 1000 MG tablet Take 1 tablet (1,000 mg total) by mouth 2 (two) times daily with a meal. 05/28/11  Yes Judy Pimple, MD  Multiple Vitamin (MULTIVITAMIN) capsule Take 1 capsule by mouth at bedtime.    Yes Historical Provider, MD  Omega-3 Fatty Acids (FISH OIL PO) Take 2 capsules by mouth daily.     Yes Historical Provider, MD  pantoprazole (PROTONIX) 40 MG tablet Take 40 mg by mouth daily.   Yes Historical Provider, MD  sertraline (ZOLOFT) 50 MG tablet Take 2 tablets (100 mg total) by mouth daily. 08/30/11  Yes Judy Pimple, MD  simvastatin (ZOCOR) 20 MG tablet Take 20 mg by mouth every evening.   Yes Historical Provider, MD  telmisartan (MICARDIS) 80 MG tablet Take 1 tablet (80 mg total) by mouth  daily. 05/28/11  Yes Judy Pimple, MD  zolpidem (AMBIEN) 10 MG tablet Take 0.5-1 tablets (5-10 mg total) by mouth at bedtime as needed. For sleep 08/30/11  Yes Judy Pimple, MD   Inpatient Medications:    . acetaminophen  650 mg Oral Once  . aspirin EC  325 mg Oral Daily  . buPROPion  150 mg Oral Daily  . enoxaparin (LOVENOX) injection  40 mg Subcutaneous Q24H  . ferrous sulfate  325 mg Oral BID  . furosemide  40 mg Intravenous BID  . insulin aspart  0-15 Units Subcutaneous TID WC  . insulin glargine  60 Units Subcutaneous QHS  . irbesartan  150 mg Oral Daily  . labetalol  20 mg Intravenous Once  . loratadine  10 mg Oral Daily  . multivitamin  1 capsule Oral QHS  . omega-3 acid ethyl esters  1 g Oral BID  . ondansetron   4 mg Intravenous Once  . pantoprazole  40 mg Oral Daily  . sertraline  100 mg Oral Daily  . simvastatin  20 mg Oral QPM  . sodium chloride  3 mL Intravenous Q12H  . sodium chloride  3 mL Intravenous Q12H   Allergies:  Allergies  Allergen Reactions  . Buspirone Hcl Nausea Only  . Glipizide     REACTION: sleepy, low sugar  . Oxycodone-Acetaminophen Itching   History   Social History  . Marital Status: Married    Spouse Name: N/A    Number of Children: N/A  . Years of Education: N/A   Occupational History  . Not on file.   Social History Main Topics  . Smoking status: Former Smoker -- 0.2 packs/day for 5 years    Quit date: 03/25/2004  . Smokeless tobacco: Never Used  . Alcohol Use: No  . Drug Use: No  . Sexually Active: Not on file   Other Topics Concern  . Not on file   Social History Narrative  . No narrative on file    Family History  Problem Relation Age of Onset  . Cancer Father     colon cancer  . Arthritis Father   . Arthritis Mother   . Asthma Mother   . Hypertension Mother      Review of Systems: General: negative for chills, fever. Has felt chilly. Has had night sweats (chronically) +15lb weight gain since June 2013. Cardiovascular: see above Dermatological: negative for rash Respiratory: negative for wheezing but lightly productive clear cough Urologic: negative for hematuria Abdominal: see above. +rectal bleeding Neurologic: negative for visual changes, syncope, or dizziness All other systems reviewed and are otherwise negative except as noted above.  Labs: poc trop neg Lab Results  Component Value Date   WBC 10.7* 01/16/2012   HGB 11.8* 01/16/2012   HCT 37.6 01/16/2012   MCV 86.6 01/16/2012   PLT 268 01/16/2012    Lab 01/16/12 1043  NA 138  K 3.7  CL 97  CO2 29  BUN 9  CREATININE 0.60  CALCIUM 9.2  PROT --  BILITOT --  ALKPHOS --  ALT --  AST --  GLUCOSE 212*   Lab Results  Component Value Date   CHOL 151 02/04/2011    HDL 35.30* 02/04/2011   LDLCALC 95 04/27/2010   TRIG 239.0* 02/04/2011   Radiology/Studies:  Dg Chest Portable 1 View 01/16/2012  *RADIOLOGY REPORT*  Clinical Data: Chest pressure.  Shortness of breath.  PORTABLE CHEST - 1 VIEW  Comparison: PA and lateral chest 08/08/2010.  Findings: There is cardiomegaly and pulmonary vascular congestion. No consolidative process, pneumothorax or effusion is identified.  IMPRESSION: No acute finding.  Cardiomegaly and pulmonary vascular congestion.   Original Report Authenticated By: Bernadene Bell. D'ALESSIO, M.D.    EKG: sinus tach 106bpm biatrial enlargment, rightward axis, nonspecific ST-T changes No prior to compare to  Physical Exam: Blood pressure 148/94, pulse 77, temperature 97.6 F (36.4 C), temperature source Oral, resp. rate 16, height 5\' 5"  (1.651 m), weight 304 lb (137.893 kg), SpO2 92.00%. General: Well developed morbidly obese WF in no acute distress. Head: Normocephalic, atraumatic, sclera non-icteric, no xanthomas, nares are without discharge.  Neck: Negative for carotid bruits. JVD not elevated but difficult to assess with neck habitus. Lungs: Clear bilaterally to auscultation without wheezes, rales, or rhonchi. Breathing is unlabored. Heart: RRR with S1 S2. No murmurs, rubs, or gallops appreciated. Pt is able to reproduce chest pain on herself on epigastric/left sternal region. Abdomen: Soft, obese/rounded, non-distended with normoactive bowel sounds. No hepatomegaly. No rebound/guarding.  Msk:  Strength and tone appear normal for age. Extremities: No clubbing or cyanosis. Tr LE edema (generally large habitus so may be more, but not significantly pitting).  Distal pedal pulses are 2+ and equal bilaterally. Neuro: Alert and oriented X 3. No facial asymmetry. No focal deficit. Moves all extremities spontaneously. Psych:  Responds to questions appropriately with a normal affect.   Assessment and Plan:  1. Dyspnea  2. Chest pain, somewhat  atypical 3. Uncontrolled HTN 4. Diabetes mellitus 5. Morbid obesity BMI 50.6 6. Severe OSA by sleep study 2012 - noncompliant with CPAP due to finances. SW to get involved for possible assistance. Placing CPAP in hospital.  7. Rectal bleeding with hx of IDA, colon polyps, family hx of colon cancer 8. PCOS   Etiology of dyspnea is not entirely clear - differential includes uncontrolled HTN, PE, OHS, ACS, CHF. However, CXR does not show any edema and pBNP is not significantly elevated (however, can be occasionally falsely low in obese patients). Agree with cycling enzymes, obtaining 2D echo, and initial trial of IV Lasix for symptom improvement and blood pressure control. Check urine pregnancy test. Check d-dimer and if positive, would proceed with CTA to rule out PE. Chest pain sounds somewhat atypical for ACS - cycle cardiac enzymes. May benefit from risk stratification given her cardiac risk factors although habitus may be limiting in terms of proceeding with nuclear study or cardiac CT. Will proceed with above workup before making final decision. Consider GI w/u for ongoing rectal bleeding. For amount of symptoms reported she is not as markedly anemic as I would expect - will need to closely follow. Discussed our thoughts with Dr. Jerral Ralph.  Dayna Dunn PA-C 01/16/2012, 2:58 PM  Cardiology Attending Patient interviewed and examined. Discussed with Dayna Dunn PA-C.  Above note annotated and modified based upon my findings.  The patient's chest pain is not suggestive of myocardial ischemia. Despite progressive dyspnea, there is no objective evidence for congestive heart failure.  Marked increase in blood pressure could be the cause of her dyspnea or possibly the result of significant physiologic dysfunction.  Regardless of the temporal connection with her presenting symptoms, improved blood pressure control is desirable.   Rectal bleeding is not causing significant anemia.  If echocardiogram and  cardiac markers are negative, patient can be followed as an outpatient on low-dose oral furosemide by her previous cardiologist, Dr. Myrtis Ser, with consideration for a functional test to exclude significant myocardial hypoperfusion.  She will also  need the attention of her gastroenterologist and primary care physician.  Tentative discharge tomorrow if symptoms are stable and testing is negative. D-dimer is also pending to exclude pulmonary embolism.  Allisonia Bing, MD 01/16/2012, 4:47 PM

## 2012-01-16 NOTE — ED Provider Notes (Signed)
History     CSN: [id number]  Arrival date & time 01/16/12  1033   First MD Initiated Contact with Patient 01/16/12 1041      Chief Complaint  Patient presents with  . Chest Pain  . Shortness of Breath    (Consider location/radiation/quality/duration/timing/severity/associated sxs/prior treatment) HPI Pt with history of obesity, asthma and HTN reports about 2 weeks of progressively worsening SOB, worse with exertion, associated with sputum production mostly clear but occasionally pink. She has had 2 weeks of constant midsternal/epigastric chest pressure no particular provoking or relieving factors. Today she was at work when she was feeling particularly bad after walking up some stairs. She was hypertensive in the office and so sent to the ED for further eval. She reports orthopnea the last few nights, unable to lie flat, feels like she's choking.   Past Medical History  Diagnosis Date  . Anemia     iron deficiency  . Asthma   . Depression   . Diabetes mellitus   . Hypertension   . Obesity   . Hyperlipidemia   . Vertigo   . PCOS (polycystic ovarian syndrome)   . Gastritis   . Insomnia     Past Surgical History  Procedure Date  . Cholecystectomy   . Hemorrhoid surgery   . Tubal ligation   . Shoulder surgery 06/2009  . Tonsillectomy and adenoidectomy CHILD  . Intrauterine device insertion 2011    Family History  Problem Relation Age of Onset  . Cancer Father     colon cancer  . Arthritis Father   . Arthritis Mother   . Asthma Mother   . Hypertension Mother     History  Substance Use Topics  . Smoking status: Former Smoker -- 0.2 packs/day for 5 years    Quit date: 03/25/2004  . Smokeless tobacco: Never Used  . Alcohol Use: No    OB History    Grav Para Term Preterm Abortions TAB SAB Ect Mult Living                  Review of Systems All other systems reviewed and are negative except as noted in HPI.   Allergies  Buspirone hcl; Glipizide; and  Oxycodone-acetaminophen  Home Medications   Current Outpatient Rx  Name Route Sig Dispense Refill  . ALBUTEROL SULFATE HFA 108 (90 BASE) MCG/ACT IN AERS Inhalation Inhale 2 puffs into the lungs every 4 (four) hours as needed. For wheezing    . BUPROPION HCL ER (XL) 150 MG PO TB24 Oral Take 150 mg by mouth daily.    Marland Kitchen CETIRIZINE HCL 10 MG PO TABS Oral Take 10 mg by mouth at bedtime.      Marland Kitchen FERROUS SULFATE 325 (65 FE) MG PO TABS Oral Take 325 mg by mouth 2 (two) times daily.      Marland Kitchen GLIMEPIRIDE 4 MG PO TABS Oral Take 4 mg by mouth daily before breakfast.      . INSULIN GLARGINE 100 UNIT/ML [place] SOLN Subcutaneous Inject 60 Units into the skin at bedtime. 10 mL 11  . METFORMIN HCL 1000 MG PO TABS Oral Take 1 tablet (1,000 mg total) by mouth 2 (two) times daily with a meal. 60 tablet 11  . MULTIVITAMINS PO CAPS Oral Take 1 capsule by mouth at bedtime.     Marland Kitchen FISH OIL PO Oral Take 2 capsules by mouth daily.      Marland Kitchen PANTOPRAZOLE SODIUM 40 MG PO TBEC Oral Take 40 mg by mouth  daily.    . SERTRALINE HCL 50 MG PO TABS Oral Take 2 tablets (100 mg total) by mouth daily. 30 tablet 11  . SIMVASTATIN 20 MG PO TABS Oral Take 20 mg by mouth every evening.    Marland Kitchen TELMISARTAN 80 MG PO TABS Oral Take 1 tablet (80 mg total) by mouth daily. 30 tablet 11  . ZOLPIDEM TARTRATE 10 MG PO TABS Oral Take 0.5-1 tablets (5-10 mg total) by mouth at bedtime as needed. For sleep 30 tablet 3    BP 200/117  Pulse 104  Temp 98.5 F (36.9 C) (Oral)  Resp 18  Ht 5\' 5"  (1.651 m)  Wt 304 lb (137.893 kg)  BMI 50.59 kg/m2  SpO2 98%  Physical Exam  Nursing note and vitals reviewed. Constitutional: She is oriented to person, place, and time. She appears well-developed and well-nourished.  HENT:  Head: Normocephalic and atraumatic.  Eyes: EOM are normal. Pupils are equal, round, and reactive to light.  Neck: Normal range of motion. Neck supple.  Cardiovascular: Normal rate, normal heart sounds and intact distal pulses.     Pulmonary/Chest: Effort normal.       Distant breath sounds  Abdominal: Bowel sounds are normal. She exhibits no distension. There is no tenderness.  Musculoskeletal: Normal range of motion. She exhibits edema (2+ edema symmetrically LE). She exhibits no tenderness.  Neurological: She is alert and oriented to person, place, and time. She has normal strength. No cranial nerve deficit or sensory deficit.  Skin: Skin is warm and dry. No rash noted.  Psychiatric: She has a normal mood and affect.    ED Course  Procedures (including critical care time)  Labs Reviewed  PRO B NATRIURETIC PEPTIDE - Abnormal; Notable for the following:    Pro B Natriuretic peptide (BNP) 503.2 (*)     All other components within normal limits  CBC - Abnormal; Notable for the following:    WBC 10.7 (*)     Hemoglobin 11.8 (*)     All other components within normal limits  BASIC METABOLIC PANEL - Abnormal; Notable for the following:    Glucose, Bld 212 (*)     All other components within normal limits  POCT I-STAT TROPONIN I   Dg Chest Portable 1 View  01/16/2012  *RADIOLOGY REPORT*  Clinical Data: Chest pressure.  Shortness of breath.  PORTABLE CHEST - 1 VIEW  Comparison: PA and lateral chest 08/08/2010.  Findings: There is cardiomegaly and pulmonary vascular congestion. No consolidative process, pneumothorax or effusion is identified.  IMPRESSION: No acute finding.  Cardiomegaly and pulmonary vascular congestion.   Original Report Authenticated By: Bernadene Bell. D'ALESSIO, M.D.      1. Hypertensive emergency       MDM   Date: 01/16/2012  Rate: 106  Rhythm: sinus tachycardia  QRS Axis: right  Intervals: normal  ST/T Wave abnormalities: nonspecific T wave changes  Conduction Disutrbances:none  Narrative Interpretation:   Old EKG Reviewed: changes noted, Nonspecific T wave flattening   Pt with markedly elevated BP with CP and SOB, improved with NTG and labetalol. admit for further eval.        Shirle Provencal B. Bernette Mayers, MD 01/16/12 2226

## 2012-01-16 NOTE — H&P (Signed)
PATIENT DETAILS Name: [name] Age: 46 y.o. Sex: female Date of Birth: 12/05/1965 Admit Date: 01/16/2012 JXB:JYNWG Tower, MD   CHIEF COMPLAINT:  Worsening exertional dyspnea and leg swelling for 2 weeks Intermittent chest pain for 2 weeks  HPI: Patient is a 46 year old Caucasian female with a past medical history of diabetes, hypertension, dyslipidemia, prior smoker (quit 2 years ago), morbid obesity, history of obstructive apnea noncompliant to CPAP (secondary to financial issues) who presented to the ED for evaluation of the above noted complaints. The patient for the past 2 weeks she has been having exertional dyspnea. She claims that even walking around the house makes her short of breath. She claims that she does not get short of breath at rest, and the shortness of breath is exclusively on exertion. She claims that she is now unable to lie flat in is not sleeping on a couch. She claims that this has now been associated with worsening bilateral lower extremity edema. She claims she has gained approximately 15 pounds over the past 2 weeks. She also claims she's been having some chest pain for the past 2 weeks as well. Patient claims that the pain is mostly sharp and located in the lower sternal area, she claims that the pain is easily reproducible on deep palpation. There is no radiation of the pain, it is not associated with nausea vomiting. Patient claims that she has been under a lot of stress recently, and at this point would not like to discuss about this.  Patient claims that she has some dry cough. She chills denies any headache. She denies any abdominal pain.Denies any fever. denies any nausea vomiting or diarrhea.  Upon presentation to the ED, it was noted that her blood pressure was 200/117. I was subsequently asked to admit this patient for further evaluation and treatment.   ALLERGIES:   Allergies  Allergen Reactions  . Buspirone Hcl Nausea Only  . Glipizide    REACTION: sleepy, low sugar  . Oxycodone-Acetaminophen Itching    PAST MEDICAL HISTORY: Past Medical History  Diagnosis Date  . Anemia     iron deficiency  . Asthma   . Depression   . Diabetes mellitus   . Hypertension   . Obesity   . Hyperlipidemia   . Vertigo   . PCOS (polycystic ovarian syndrome)   . Gastritis   . Insomnia     PAST SURGICAL HISTORY: Past Surgical History  Procedure Date  . Cholecystectomy   . Hemorrhoid surgery   . Tubal ligation   . Shoulder surgery 06/2009  . Tonsillectomy and adenoidectomy CHILD  . Intrauterine device insertion 2011    MEDICATIONS AT HOME: Prior to Admission medications   Medication Sig Start Date End Date Taking? Authorizing Provider  albuterol (PROAIR HFA) 108 (90 BASE) MCG/ACT inhaler Inhale 2 puffs into the lungs every 4 (four) hours as needed. For wheezing   Yes Historical Provider, MD  buPROPion (WELLBUTRIN XL) 150 MG 24 hr tablet Take 150 mg by mouth daily.   Yes Historical Provider, MD  cetirizine (ZYRTEC) 10 MG tablet Take 10 mg by mouth at bedtime.     Yes Historical Provider, MD  ferrous sulfate 325 (65 FE) MG tablet Take 325 mg by mouth 2 (two) times daily.     Yes Historical Provider, MD  glimepiride (AMARYL) 4 MG tablet Take 4 mg by mouth daily before breakfast.   01/21/11  Yes Judy Pimple, MD  insulin glargine (LANTUS SOLOSTAR) 100 UNIT/ML injection Inject  60 Units into the skin at bedtime. 05/28/11  Yes Judy Pimple, MD  metFORMIN (GLUCOPHAGE) 1000 MG tablet Take 1 tablet (1,000 mg total) by mouth 2 (two) times daily with a meal. 05/28/11  Yes Judy Pimple, MD  Multiple Vitamin (MULTIVITAMIN) capsule Take 1 capsule by mouth at bedtime.    Yes Historical Provider, MD  Omega-3 Fatty Acids (FISH OIL PO) Take 2 capsules by mouth daily.     Yes Historical Provider, MD  pantoprazole (PROTONIX) 40 MG tablet Take 40 mg by mouth daily.   Yes Historical Provider, MD  sertraline (ZOLOFT) 50 MG tablet Take 2 tablets (100 mg  total) by mouth daily. 08/30/11  Yes Judy Pimple, MD  simvastatin (ZOCOR) 20 MG tablet Take 20 mg by mouth every evening.   Yes Historical Provider, MD  telmisartan (MICARDIS) 80 MG tablet Take 1 tablet (80 mg total) by mouth daily. 05/28/11  Yes Judy Pimple, MD  zolpidem (AMBIEN) 10 MG tablet Take 0.5-1 tablets (5-10 mg total) by mouth at bedtime as needed. For sleep 08/30/11  Yes Judy Pimple, MD    FAMILY HISTORY: Family History  Problem Relation Age of Onset  . Cancer Father     colon cancer  . Arthritis Father   . Arthritis Mother   . Asthma Mother   . Hypertension Mother     SOCIAL HISTORY:  reports that she quit smoking about 2 years ago. She has never used smokeless tobacco. She reports that she does not drink alcohol or use illicit drugs.  REVIEW OF SYSTEMS:  Constitutional:   No  weight loss, night sweats,  Fevers, chills, fatigue.  HEENT:    No headaches, Difficulty swallowing,Tooth/dental problems,Sore throat,  No sneezing, itching, ear ache, nasal congestion, post nasal drip,   Cardio-vascular: No  anasarca, dizziness, palpitations  GI:  No heartburn, indigestion, abdominal pain, nausea, vomiting, diarrhea, change in       bowel habits, loss of appetite  Resp:  No excess mucus, no productive cough,  No coughing up of blood.No change in color of mucus.No wheezing.No chest wall deformity  Skin:  no rash or lesions.  GU:  no dysuria, change in color of urine, no urgency or frequency.  No flank pain.  Musculoskeletal: No joint pain or swelling.  No decreased range of motion.  No back pain.  Psych: No change in mood or affect. No depression or anxiety.  No memory loss.   PHYSICAL EXAM: Blood pressure 151/102, pulse 78, temperature 98.5 F (36.9 C), temperature source Oral, resp. rate 24, height 5\' 5"  (1.651 m), weight 137.893 kg (304 lb), SpO2 96.00%.  General appearance :Awake, alert, not in any distress. Speech Clear. Not toxic Looking HEENT: Atraumatic  and Normocephalic, pupils equally reactive to light and accomodation Neck: supple, No cervical lymphadenopathy.  Chest:Good air entry bilaterally,  but decreased at the bases given body habitus. Some mild bibasilar rales but mostly clear.  CVS: S1 S2 regular, no murmurs.  heart sounds are distant given body habitus. Pain is easily reducible with palpation on the left lower sternal region.  Abdomen: Bowel sounds present, Non tender and not distended with no gaurding, rigidity or rebound.old right upper abdominal scar.  Extremities: B/L Lower Ext shows 2-3 edema edema, both legs are warm to touch Neurology: Awake alert, and oriented X 3, CN II-XII intact, Non focal Skin:No Rash Wounds:N/A  LABS ON ADMISSION:   Basename 01/16/12 1043  NA 138  K 3.7  CL 97  CO2 29  GLUCOSE 212*  BUN 9  CREATININE 0.60  CALCIUM 9.2  MG --  PHOS --   No results found for this basename: AST:2,ALT:2,ALKPHOS:2,BILITOT:2,PROT:2,ALBUMIN:2 in the last 72 hours No results found for this basename: LIPASE:2,AMYLASE:2 in the last 72 hours  Basename 01/16/12 1043  WBC 10.7*  NEUTROABS --  HGB 11.8*  HCT 37.6  MCV 86.6  PLT 268   No results found for this basename: CKTOTAL:3,CKMB:3,CKMBINDEX:3,TROPONINI:3 in the last 72 hours No results found for this basename: DDIMER:2 in the last 72 hours No components found with this basename: POCBNP:3   RADIOLOGIC STUDIES ON ADMISSION: Dg Chest Portable 1 View  01/16/2012  *RADIOLOGY REPORT*  Clinical Data: Chest pressure.  Shortness of breath.  PORTABLE CHEST - 1 VIEW  Comparison: PA and lateral chest 08/08/2010.  Findings: There is cardiomegaly and pulmonary vascular congestion. No consolidative process, pneumothorax or effusion is identified.  IMPRESSION: No acute finding.  Cardiomegaly and pulmonary vascular congestion.   Original Report Authenticated By: Bernadene Bell. D'ALESSIO, M.D.     ASSESSMENT AND PLAN: Present on Admission:  .Hypertensive urgency -Blood  pressure not significantly improved after patient received IV labetalol here in the emergency room.  -Will add IV Lasix, continue with ACE inhibitor. We'll slowly add beta blockers-depending on ejection fraction and on how her blood pressure tolerates ACE inhibitor and IV furosemide therapy.   .CHF (congestive heart failure) -Patient is coming in with symptoms suggestive of heart failure with decompensation-exertional dyspnea, orthopnea and bilateral lower leg edema. She claims to have put on 15 pounds over the past couple weeks. However her BNP is only in the 500s range.  -Are now we'll start on IV diuretics, obtain a 2-D echocardiogram.  -Will also obtain cardiology consultation.  -Daily weights and strict I&Os -We'll slowly add beta blockers depending on blood pressure trend, for now we'll continue with IV Lasix and ACE inhibitor.  .Chest pain -This sounds atypical-it is easily reproducible on palpation. However she does have significant risk factors, cardiac enzymes will be cycled.  -Echocardiogram has been ordered  -Patient will be on aspirin  -Given significant risk factors-await cardiology evaluation for risk stratification   .Anxiety -As needed Xanax  -claims very anxious due to personal stressors  .DIABETES MELLITUS, TYPE II -Continue with Lantus, had SSI while inpatient. -Hold oral hypoglycemics for now  .HYPERLIPIDEMIA -continue with Statins  .DEPRESSION -continue with regular meds  .OBESITY, MORBID -Counseled regarding the importance of weight loss.   Marland KitchenGERD -Continue PPI  .OSA -Claims to have a sleep study and apparently CPAP was recommended, however patient has had financial issues and has  been noncompliant -Will place on CPAP while inpatient -Patient is interested on being discharged with CPAP machine-would ask case management for assistance tomorrow.  Further plan will depend as patient's clinical course evolves and further radiologic and laboratory data  become available. Patient will be monitored closely.   DVT Prophylaxis: -Prophylactic Lovenox  Code Status: -Full code   Total time spent for admission equals 45 minutes.  [facility] Pager [phone]  If 7PM-7AM, please contact night-coverage [email or web address] Password [email or web address] 01/16/2012, 2:08 PM

## 2012-01-16 NOTE — Progress Notes (Signed)
Patient refused cpap tonight. Patient has a severe headache and does not want to wear cpap. RT will continue to monitor.

## 2012-01-16 NOTE — ED Notes (Addendum)
Patient reports she noted onset of swelling in her feet 2 weeks ago.  In the past 2 days she has developed sob and chest pain.  Patient received aspirin 324 mg prior to arrival.  She is unsure if she has had any weight gain.  She has no known hx of heart failure.  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  He is concerned that she is having heart failure sx.  She is also hypertensive in the ED

## 2012-01-16 NOTE — Progress Notes (Signed)
  Echocardiogram 2D Echocardiogram has been performed.  [name] 01/16/2012, 6:26 PM

## 2012-01-16 NOTE — ED Notes (Signed)
Patient arrived via private vehicle with increased shortness of breath and chest pressure for apprx. 2 weeks. She is diaphoretic. She is upset and crying and states she doesn't want to die. She was sent from her work place on oxygen.

## 2012-01-17 ENCOUNTER — Encounter (HOSPITAL_COMMUNITY): Payer: Self-pay | Admitting: Pulmonary Disease

## 2012-01-17 DIAGNOSIS — G4733 Obstructive sleep apnea (adult) (pediatric): Secondary | ICD-10-CM

## 2012-01-17 DIAGNOSIS — I5031 Acute diastolic (congestive) heart failure: Secondary | ICD-10-CM

## 2012-01-17 HISTORY — DX: Obstructive sleep apnea (adult) (pediatric): G47.33

## 2012-01-17 LAB — DRUGS OF ABUSE SCREEN W/O ALC, ROUTINE URINE
Barbiturate Quant, Ur: NEGATIVE
Cocaine Metabolites: NEGATIVE
Creatinine,U: 264.4 mg/dL

## 2012-01-17 LAB — COMPREHENSIVE METABOLIC PANEL
AST: 29 U/L (ref 0–37)
Albumin: 3.7 g/dL (ref 3.5–5.2)
Calcium: 9.3 mg/dL (ref 8.4–10.5)
Chloride: 95 mEq/L — ABNORMAL LOW (ref 96–112)
Creatinine, Ser: 0.71 mg/dL (ref 0.50–1.10)
Total Protein: 7.5 g/dL (ref 6.0–8.3)

## 2012-01-17 LAB — LIPID PANEL
Cholesterol: 138 mg/dL (ref 0–200)
HDL: 38 mg/dL — ABNORMAL LOW (ref >39)
Total CHOL/HDL Ratio: 3.6 RATIO
Triglycerides: 108 mg/dL (ref <150)

## 2012-01-17 LAB — CBC
MCH: 27 pg (ref 26.0–34.0)
MCHC: 30.7 g/dL (ref 30.0–36.0)
MCV: 87.9 fL (ref 78.0–100.0)
Platelets: 290 10*3/uL (ref 150–400)
RDW: 15.4 % (ref 11.5–15.5)
WBC: 12.9 10*3/uL — ABNORMAL HIGH (ref 4.0–10.5)

## 2012-01-17 LAB — GLUCOSE, CAPILLARY: Glucose-Capillary: 197 mg/dL — ABNORMAL HIGH (ref 70–99)

## 2012-01-17 MED ORDER — POTASSIUM CHLORIDE CRYS ER 20 MEQ PO TBCR
40.0000 meq | EXTENDED_RELEASE_TABLET | Freq: Once | ORAL | Status: AC
  Administered 2012-01-17: 40 meq via ORAL

## 2012-01-17 MED ORDER — LABETALOL HCL 5 MG/ML IV SOLN
10.0000 mg | Freq: Once | INTRAVENOUS | Status: AC
  Administered 2012-01-17: 10 mg via INTRAVENOUS

## 2012-01-17 MED ORDER — INSULIN GLARGINE 100 UNIT/ML ~~LOC~~ SOLN
80.0000 [IU] | Freq: Every day | SUBCUTANEOUS | Status: DC
  Administered 2012-01-17 – 2012-01-19 (×3): 80 [IU] via SUBCUTANEOUS

## 2012-01-17 MED ORDER — PREDNISONE 50 MG PO TABS
50.0000 mg | ORAL_TABLET | Freq: Every day | ORAL | Status: DC
  Administered 2012-01-17: 50 mg via ORAL
  Filled 2012-01-17 (×2): qty 1

## 2012-01-17 MED ORDER — ALBUTEROL SULFATE (5 MG/ML) 0.5% IN NEBU
2.5000 mg | INHALATION_SOLUTION | Freq: Four times a day (QID) | RESPIRATORY_TRACT | Status: DC
  Administered 2012-01-17 – 2012-01-22 (×19): 2.5 mg via RESPIRATORY_TRACT
  Filled 2012-01-17 (×17): qty 0.5

## 2012-01-17 MED ORDER — INSULIN ASPART 100 UNIT/ML ~~LOC~~ SOLN
4.0000 [IU] | Freq: Three times a day (TID) | SUBCUTANEOUS | Status: DC
  Administered 2012-01-18 – 2012-01-19 (×6): 4 [IU] via SUBCUTANEOUS

## 2012-01-17 MED ORDER — IRBESARTAN 300 MG PO TABS
300.0000 mg | ORAL_TABLET | Freq: Every day | ORAL | Status: DC
  Administered 2012-01-18 – 2012-01-22 (×5): 300 mg via ORAL
  Filled 2012-01-17 (×5): qty 1

## 2012-01-17 MED ORDER — FUROSEMIDE 10 MG/ML IJ SOLN
80.0000 mg | Freq: Two times a day (BID) | INTRAMUSCULAR | Status: DC
  Administered 2012-01-17 – 2012-01-18 (×2): 80 mg via INTRAVENOUS
  Filled 2012-01-17 (×4): qty 8

## 2012-01-17 MED ORDER — SPIRONOLACTONE 25 MG PO TABS
25.0000 mg | ORAL_TABLET | Freq: Every day | ORAL | Status: DC
  Administered 2012-01-17 – 2012-01-22 (×6): 25 mg via ORAL
  Filled 2012-01-17 (×6): qty 1

## 2012-01-17 MED ORDER — IPRATROPIUM BROMIDE 0.02 % IN SOLN
0.5000 mg | Freq: Four times a day (QID) | RESPIRATORY_TRACT | Status: DC
  Administered 2012-01-17 – 2012-01-18 (×2): 0.5 mg via RESPIRATORY_TRACT
  Filled 2012-01-17 (×3): qty 2.5

## 2012-01-17 MED ORDER — DM-GUAIFENESIN ER 30-600 MG PO TB12
1.0000 | ORAL_TABLET | Freq: Two times a day (BID) | ORAL | Status: DC
  Administered 2012-01-17 – 2012-01-19 (×5): 1 via ORAL
  Filled 2012-01-17 (×6): qty 1

## 2012-01-17 MED ORDER — IRBESARTAN 150 MG PO TABS
150.0000 mg | ORAL_TABLET | Freq: Once | ORAL | Status: AC
  Administered 2012-01-17: 150 mg via ORAL
  Filled 2012-01-17 (×2): qty 1

## 2012-01-17 MED ORDER — FUROSEMIDE 10 MG/ML IJ SOLN
40.0000 mg | Freq: Two times a day (BID) | INTRAMUSCULAR | Status: DC
  Administered 2012-01-17: 40 mg via INTRAVENOUS

## 2012-01-17 MED ORDER — PREDNISONE 20 MG PO TABS
40.0000 mg | ORAL_TABLET | Freq: Every day | ORAL | Status: DC
  Administered 2012-01-18 – 2012-01-21 (×4): 40 mg via ORAL
  Filled 2012-01-17 (×5): qty 2

## 2012-01-17 MED ORDER — POTASSIUM CHLORIDE CRYS ER 20 MEQ PO TBCR
40.0000 meq | EXTENDED_RELEASE_TABLET | Freq: Once | ORAL | Status: AC
  Administered 2012-01-17: 40 meq via ORAL
  Filled 2012-01-17: qty 2

## 2012-01-17 MED ORDER — LORAZEPAM 1 MG PO TABS
1.0000 mg | ORAL_TABLET | Freq: Three times a day (TID) | ORAL | Status: DC | PRN
  Administered 2012-01-18 – 2012-01-21 (×4): 1 mg via ORAL
  Filled 2012-01-17 (×4): qty 1

## 2012-01-17 NOTE — Progress Notes (Signed)
Inpatient Diabetes Program Recommendations  AACE/ADA: New Consensus Statement on Inpatient Glycemic Control (2013)  Target Ranges:  Prepandial:   less than 140 mg/dL      Peak postprandial:   less than 180 mg/dL (1-2 hours)      Critically ill patients:  140 - 180 mg/dL   Reason for Visit: Hyperglycemia in high 100's into 200's  While on steroid therapy of Prednisone, any dose greater than 10 mg/day: Inpatient Diabetes Program Recommendations Insulin - Meal Coverage: Pt would benefit from addition of meal coverage of 4 units Novolog tidwc.  Pt takes Amaryl at home, but Amaryl is not recommended to use while in the hospital in case the po intake is variable.  Novolog meal coverage is given only if pt eats at least 50% of the meal. Diet: Added carbohydrate modified to existing diet orders of heart healthy.  Heart healthy diet actually has more carbohydrate than a regular diet.  (HH only limits foods with high fat). Prednisone does not typically increase the fasting glucose levels; it affects primarily the post-prandial cbg's.  Therefore, pt should not need an additional increase in Lantus unless the fasting glucose levels stay high. Note: Thank you, Lenor Coffin, RN, CNS, Diabetes Coordinator [phone])

## 2012-01-17 NOTE — Progress Notes (Signed)
   CARE MANAGEMENT NOTE 01/17/2012  Patient:  [name], [name]   Account Number:  [id number]  Date Initiated:  01/17/2012  Documentation initiated by:  GRAVES-BIGELOW,Enola Siebers  Subjective/Objective Assessment:   Pt admitted with htn urgency. Pt arrived here from work. Hx of cpap use. Pt is from home with husband.     Action/Plan:   CM did contact [facility] due to pt had sleep study in August of 2012. Sleep study sent to [facility] Derrian Harris.   Anticipated DC Date:  01/18/2012   Anticipated DC Plan:  HOME/SELF CARE      DC Planning Services  CM consult      PAC Choice  DURABLE MEDICAL EQUIPMENT   Choice offered to / List presented to:             Status of service:  In process, will continue to follow Medicare Important Message given?   (If response is "NO", the following Medicare IM given date fields will be blank) Date Medicare IM given:   Date Additional Medicare IM given:    Discharge Disposition:    Per UR Regulation:  Reviewed for med. necessity/level of care/duration of stay  If discussed at Long Length of Stay Meetings, dates discussed:    Comments:  01-17-12 1212 Tomi Bamberger, RN,BSN [phone] [facility] will look at pt over the weekend and discuss payment option for dme. Weekend CM will assist with needs.

## 2012-01-17 NOTE — Progress Notes (Signed)
Pt ambulated in the hall way, she got a little bit short of breath and her 02 sat dropped in the 80's, it came back up to the upper 90's after putting her on 2l oxygen via nasal canula.

## 2012-01-17 NOTE — Consult Note (Signed)
Name: [name] MRN: [id number] DOB: [birth year]    LOS: 1   History of Present Illness:  Pulm [name] Reason for consult - dyspnea, tracheomalacia 46 y.o obese ex smoker with DM, HTN, HL, OSA not on CPAP, was admitted 10/24 with essentially 2 week h/o nasal congestion,  progressive dyspnea, wt gain, lower extremity edema, atypical CP and dyspnea w/ marked HTN. pBNP 503. POC troponin neg x1. ?glu 212, ?WBC 10.7, ?Hgb 11.8. CXR showed cardiomegaly and pulm vasc congestion but otherwise no acute finding.  Was admitted to medical service w/ cardiology consultation. Treatment has included: diuresis, CPAP while in hospital  and supplemental oxygen. PCCM asked to assess for dyspnea.  CT angio neg for PE but large left goiter wrapping around trachea with some mass effect. Review of PFTs 9/12 suggests inspiratory loop flattening s/o variable extrathoracic obstruction    She notes childhood hx of asthma, but [name] last note reports more likely upper airway disease . She has coronary artery disease on cath in 2006 for CP with only catheter-induced spasm of RCA. EF was 65% at that time. She reports med compliance, but does note that her DM is uncontrolled and that BP runs very high.  Tests / Events: 10/25: Poorly visualized. Wall thickness wasincreased in a pattern of moderate LVH. Systolic function was normal. The estimated ejection fraction was in the rangeof 50% to 55%. Images were inadequate for LV wall motion assessment. Features are consistent with a pseudonormal left ventricular filling pattern, with concomitant abnormal relaxation and increased filling pressure (grade 2 diastolic dysfunction).  CT 10/24:  No evidence of central pulmonary embolism. Cardiomegaly and enlargement of the pulmonary arteries  suggestive of pulmonary arterial hypertension. Nonspecific findings suggestive of possible tracheomalacia  Apparent enlargement of the left lobe of the thyroid with mass effect upon  the trachea.   Past Medical History  Diagnosis Date  . Anemia     iron deficiency  . Asthma     a. 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  . Depression     a. Stress reaction 08/2011 in multiple social stressors.  . Diabetes mellitus   . Hypertension   . Morbid obesity   . Hyperlipidemia   . Vertigo   . PCOS (polycystic ovarian syndrome)   . Gastritis   . Insomnia   . Complication of anesthesia     DIFFICULT WAKING  . GERD (gastroesophageal reflux disease)    Past Surgical History  Procedure Date  . Cholecystectomy   . Hemorrhoid surgery   . Tubal ligation   . Shoulder surgery 06/2009  . Tonsillectomy and adenoidectomy CHILD  . Intrauterine device insertion 2011   Prior to Admission medications   Medication Sig Start Date End Date Taking? Authorizing Provider  albuterol (PROAIR HFA) 108 (90 BASE) MCG/ACT inhaler Inhale 2 puffs into the lungs every 4 (four) hours as needed. For wheezing   Yes Historical Provider, MD  buPROPion (WELLBUTRIN XL) 150 MG 24 hr tablet Take 150 mg by mouth daily.   Yes Historical Provider, MD  cetirizine (ZYRTEC) 10 MG tablet Take 10 mg by mouth at bedtime.     Yes Historical Provider, MD  ferrous sulfate 325 (65 FE) MG tablet Take 325 mg by mouth 2 (two) times daily.     Yes Historical Provider, MD  glimepiride (AMARYL) 4 MG tablet Take 4 mg by mouth daily before breakfast.   01/21/11  Yes Marne A  Tower, MD  insulin glargine (LANTUS SOLOSTAR) 100 UNIT/ML injection Inject 60 Units into the skin at bedtime. 05/28/11  Yes Judy Pimple, MD  metFORMIN (GLUCOPHAGE) 1000 MG tablet Take 1 tablet (1,000 mg total) by mouth 2 (two) times daily with a meal. 05/28/11  Yes Judy Pimple, MD  Multiple Vitamin (MULTIVITAMIN) capsule Take 1 capsule by mouth at bedtime.    Yes Historical Provider, MD  Omega-3 Fatty Acids (FISH OIL PO) Take 2 capsules by mouth daily.     Yes Historical Provider, MD  pantoprazole (PROTONIX) 40 MG  tablet Take 40 mg by mouth daily.   Yes Historical Provider, MD  sertraline (ZOLOFT) 50 MG tablet Take 2 tablets (100 mg total) by mouth daily. 08/30/11  Yes Judy Pimple, MD  simvastatin (ZOCOR) 20 MG tablet Take 20 mg by mouth every evening.   Yes Historical Provider, MD  telmisartan (MICARDIS) 80 MG tablet Take 1 tablet (80 mg total) by mouth daily. 05/28/11  Yes Judy Pimple, MD  zolpidem (AMBIEN) 10 MG tablet Take 0.5-1 tablets (5-10 mg total) by mouth at bedtime as needed. For sleep 08/30/11  Yes Judy Pimple, MD   Allergies Allergies  Allergen Reactions  . Buspirone Hcl Nausea Only  . Glipizide     REACTION: sleepy, low sugar  . Oxycodone-Acetaminophen Itching    Family History Family History  Problem Relation Age of Onset  . Cancer Father     colon cancer  . Arthritis Father   . Arthritis Mother   . Asthma Mother   . Hypertension Mother     Social History  reports that she quit smoking about 7 years ago. She has never used smokeless tobacco. She reports that she does not drink alcohol or use illicit drugs.  Review Of Systems   Constitutional: No weight loss, gain, night sweats, Fevers, chills, fatigue .  HEENT: No headaches, visual changes, Difficulty swallowing, Tooth/dental problems, or Sore throat,  No sneezing, itching, ear ache, nasal congestion, post nasal drip, no visual complaints.  CV: No chest pain, ++Orthopnea, PND,++ swelling in lower extremities, dizziness,- palpitations, - syncope.  GI No heartburn, indigestion, abdominal pain, nausea, vomiting, diarrhea, change in bowel habits, loss of appetite, bloody stools.  Resp:+ cough improved, No coughing up of blood. No change in color of mucus. No wheezing. Dyspnea improved.  Skin: no rash or itching or icterus GU: no dysuria, change in color of urine, no urgency or frequency. No flank pain, no hematuria  MS: No joint pain or swelling. No decreased range of motion  Psych: No change in mood or affect. No depression  or anxiety.  Neuro: no difficulty with speech, weakness, numbness, ataxia   Vital Signs: BP 126/84  Pulse 84  Temp 98 F (36.7 C) (Oral)  Resp 20  Ht 5\' 5"  (1.651 m)  Wt 136.986 kg (302 lb)  BMI 50.26 kg/m2  SpO2 91% Room air        Intake/Output Summary (Last 24 hours) at 01/17/12 1338 Last data filed at 01/17/12 1220  Gross per 24 hour  Intake      0 ml  Output   2150 ml  Net  -2150 ml    Physical Examination: General: obese white female, no acute distress.  Neuro:  Awake, oriented, no focal def   HEENT:  Neck is large, difficult to differentiate thyroid from excessive tissue + upper airway faint wheeze at end expiration  Neck:  Large  Cardiovascular:  rrr Lungs:  Decreased  Abdomen:  Obese  Musculoskeletal:  Intact  Skin:  + lower extremity edema       Labs and Imaging:   Lab 01/17/12 0237 01/16/12 1616 01/16/12 1043  NA 138 -- 138  K 3.4* -- 3.7  CL 95* -- 97  CO2 33* -- 29  BUN 12 -- 9  CREATININE 0.71 0.62 0.60  GLUCOSE 216* -- 212*    Lab 01/17/12 0237 01/16/12 1616 01/16/12 1043  HGB 11.4* 11.1* 11.8*  HCT 37.1 35.4* 37.6  WBC 12.9* 10.4 10.7*  PLT 290 287 268    Assessment and Plan: Dyspnea. Multifactorial: agree, think this is mostly a complication of her morbid obesity, resultant  OSA +/- OHS, secondary PAH, and diastolic heart dysfxn. She seems to be responding favorably to diuretics. She does have an enlarged thyroid which appears to be having mass effect on left lobe which may be resulting in some airway obstruction but unclear on how much this contributes to her dyspnea. Doubt that the radiographic suggestion of tracheal malacia is a significant contributor to her symptoms. Her PFTs do not support significant airflow limitations, insp loop flattening does raise a question of extrathoracic obstruction due to goiter  Recommendations OSA -Auto CPAP 10-20 cm with nasal mask, humidity on discharge, observe her tonight on this  Diastolic heart  failure -need to determine ambulatory oxygen needs-->need to avoid hypoxia  -agree w/ diuretics as you are doing Feel this will be a lot better if OSA is adequately treated  Acute bronchitis - symptomatically improved Prednisone can be rapidly tapered to off in 7-10 days, for fear of worsening her sugars  Goiter -will repeat thyroid testing--> do not think that any primary intervention will be required at this point, as this is not the primary issue, but may be something to consider in future. In the short term CPAP would help with potential additional obstruction that thyroid might be adding.   Discussed in detail with pt & sister She will call for Fu appt with dr [name] ondischarge  BABCOCK,PETE 01/17/2012, 1:38 PM   Cyril Mourning MD. FCCP. [facility] Pager [phone] If no response call [phone]

## 2012-01-17 NOTE — Progress Notes (Signed)
[facility] PROGRESS NOTE  AANSHI HORSEMAN ZHY:[id number] DOB: 11/26/1965 DOA: 01/16/2012 PCP: Roxy Manns, MD  Assessment/Plan:  Hypertensive urgency/malignant hypertension.  Blood pressure trended down initially, but now rising. - Continue lasix 80mg  IV BID -  Increase irbesartan to 300mg  daily  -  Consider adding selective BB   .CHF (congestive heart failure) Increase LEE, DOE, and mild weight gain with persistent SOB with minimal exertion.  BNP may be falsely low due to obesity.  - Diuretics and ARB as above -  Add BB as tolerated - ECHO:  Normal EF with grade 2 diastolic dysfunction.  Moderately dilated RV with peak PA pressure of - Appreciate cardiology recommendations - Continue Daily weights and strict I&Os   Asthma and with significant dyspnea -  Jenille Laszlo course of prednisone 50mg  daily  -  Duonebs q6h -  Albuterol as needed.  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  Troponins negative x 3 - Continue aspirin   .Anxiety, stable -As needed Xanax   .DIABETES MELLITUS, TYPE II, uncontrolled.  CBGs per patient are in the mid 200s in the morning and starting prednisone - Increase Lantus to 80units qhs -  Add aspart 4 units AC -  Continue SSI    .HYPERLIPIDEMIA  -continue with Statins   .DEPRESSION  -continue with regular meds   .OBESITY, MORBID  -Counseled regarding the importance of weight loss.   Marland KitchenGERD  -Continue PPI   .OSA.  CPAP overnight tonight to titrate settings.  CM working on obtaining machine for home.  Very important as patient's SOB, HTN are likely being worsened by patient's uncontrolled/untreated OSA.    Hypokalemia: LIkely due to lasix.   -  Oral potassium once -  Repeat electrolytes in AM.  Rising BUN:Cr and bicarb.   -  Monitor volume status and lasix carefully  DIET:   Carbohydrate controlled ACCESS:  PIV IVF: None PROPH:  lovenox  Code Status: Full Family Communication: spoke with patient and family regarding plan of care Disposition Plan: Pending stable blood pressure, improvement in dyspnea, and fingersticks controlled.  HPI:  Patient is a 46 year old Caucasian female with a past medical history of diabetes, hypertension, dyslipidemia, prior smoker (quit 2 years ago), morbid obesity, history of obstructive apnea noncompliant to CPAP (secondary to financial issues) who presented to the ED for evaluation of the above noted complaints. The patient for the past 2 weeks she has been having exertional dyspnea. She claims that even walking around the house makes her Makila Colombe of breath. She claims that she does not get English Tomer of breath at rest, and the shortness of breath is exclusively on exertion. She claims that she is now unable to lie flat in is not sleeping on a couch. She claims that this has now been associated with worsening bilateral lower extremity edema. She claims she has gained approximately 15 pounds over the past 2 weeks. She also claims she's been having some chest pain for the past 2 weeks as well. Patient claims that the pain is mostly sharp and located in the lower sternal area, she claims that the pain is easily reproducible on deep palpation. There is no radiation of the pain, it is not associated with nausea vomiting. Patient claims that she has been under a lot of stress recently, and at this point would not like to discuss about this.  Patient claims that she has some dry cough. She chills denies any headache. She denies any abdominal pain.Denies any fever. denies any nausea vomiting or diarrhea.   Upon presentation to the ED, it was noted that her blood pressure was 200/117. I was subsequently asked to admit this patient for further evaluation and treatment.   Consultants:  Cardiology  Pulmonology  Procedures:  CT angio  chest  Antibiotics:  None  HPI/Subjective: Patient states she feels Rafaella Kole of breath at rest and has an uncontrollable cough.  Overnight she had an episode of severe nausea and vomiting with headache which has since resolved.  Denies diarrhea  Objective: Filed Vitals:   01/17/12 0630 01/17/12 0755 01/17/12 1400 01/17/12 1500  BP: 170/110 126/84 148/90 164/92  Pulse: 84  82 98  Temp:   98 F (36.7 C)   TempSrc:      Resp:   18   Height:      Weight:      SpO2:   92%     Intake/Output Summary (Last 24 hours) at 01/17/12 1656 Last data filed at 01/17/12 1500  Gross per 24 hour  Intake    720 ml  Output   2800 ml  Net  -2080 ml   Filed Weights   01/16/12 1045 01/17/12 0430  Weight: 137.893 kg (304 lb) 136.986 kg (302 lb)    Exam:   General:  Obese CF, no acute distress  HEENT:  MMM  Cardiovascular:  Bradycardic, no murmurs, rubs or gallops  Respiratory:  Diminished breath sounds bilaterally with expiratory wheeze that sounds more upper airway.  Frequent cough during exam.  No focal rales  Abdomen: NABS, soft, nondistended  EXt:  2+ LEE  Data Reviewed: Basic Metabolic Panel:  Lab 01/17/12 1478 01/16/12 1616 01/16/12 1043  NA 138 -- 138  K 3.4* -- 3.7  CL 95* -- 97  CO2 33* -- 29  GLUCOSE 216* -- 212*  BUN 12 -- 9  CREATININE 0.71 0.62 0.60  CALCIUM 9.3 -- 9.2  MG -- -- --  PHOS -- -- --   Liver Function Tests:  Lab 01/17/12 0237  AST 29  ALT 38*  ALKPHOS 111  BILITOT 0.3  PROT 7.5  ALBUMIN 3.7   No results found for this basename: LIPASE:5,AMYLASE:5 in the last 168 hours No results found for this basename: AMMONIA:5 in the last 168 hours CBC:  Lab 01/17/12 0237 01/16/12 1616 01/16/12 1043  WBC 12.9* 10.4 10.7*  NEUTROABS -- -- --  HGB 11.4* 11.1* 11.8*  HCT 37.1 35.4* 37.6  MCV 87.9 86.8 86.6  PLT 290 287 268   Cardiac Enzymes:  Lab 01/17/12 0236 01/16/12 2108 01/16/12 1615  CKTOTAL -- -- --  CKMB -- -- --  CKMBINDEX -- -- --   TROPONINI <0.30 <0.30 <0.30   BNP (last 3 results)  Basename 01/17/12 0237 01/16/12 1042  PROBNP 590.3* 503.2*   CBG:  Lab 01/17/12 1625 01/17/12 1159 01/17/12 0736 01/16/12 2044 01/16/12 1626  GLUCAP 213* 199* 197* 228* 165*    No results found for this or any previous visit (from the past 240 hour(s)).   Studies: Ct Angio Chest Pe W/cm &/or Wo Cm  01/16/2012  *RADIOLOGY REPORT*  Clinical Data: Chest pain, shortness of breath, elevated D-dimer, evaluate for pulmonary embolism  CT ANGIOGRAPHY CHEST  Technique:  Multidetector CT imaging of the chest using the standard protocol during bolus administration of intravenous contrast. Multiplanar reconstructed images including MIPs were obtained and reviewed  to evaluate the vascular anatomy.  Contrast: 80mL OMNIPAQUE IOHEXOL 350 MG/ML SOLN  Comparison: Chest CT - 10/12/2004  Vascular Findings:  There is suboptimal opacification of the pulmonary arterial system of the main pulmonary artery measuring 161 HU.  There are no discrete filling defects within the central pulmonary arterial system to the level of the bilateral segmental pulmonary arteries. There is marked enlargement of the main caliber the main pulmonary artery, measuring approximately 4.2 cm in greatest transverse axial dimension, unchanged.  Cardiomegaly.  No pericardial effusion.  Normal caliber of the thoracic aorta.  Bovine configuration of the aortic arch.  No definite periaortic stranding.  Nonvascular findings:  Evaluation of the pulmonary parenchyma is degraded secondary to patient body habitus and respiratory artifact.  Linear heterogeneous opacities within the left lower lobe and geographic area of peripheral consolidation within the right lower lobe (image 36) are favored to represent atelectasis. Scattered areas of ground-glass lucency are nonspecific but may be seen in the setting of airways disease.  There is minimal collapse of the distal aspect of the trachea and bilateral main  pulmonary bronchi, nonspecific but may be seen in setting of tracheomalacia. The central airways remain patent.  No pleural effusion or pneumothorax.  Shoddy mediastinal lymph nodes are not enlarged by CT criteria with index precarinal node measuring 1 cm in Taralyn Ferraiolo axis diameter (image 27, series 4 and index prevascular lymph node measuring 6 mm in Reshad Saab axis diameter (image 21).  No definite hilar or axillary lymphadenopathy.  Incidental imaging of the upper abdomen is normal.  There is an apparent interval enlargement of the left lobe of the thyroid, now with mass effect and rightward deviation of the tracheal air column.  This finding without a discrete thyroid nodule.  No acute or aggressive osseous abnormalities.  IMPRESSION:  1.  Degraded examination secondary to patient body habitus, respiratory artifact and suboptimal opacification of the pulmonary arterial system.  2.  No evidence of central pulmonary embolism.  If clinical concern persists for pulmonary embolism, further evaluation with VQ scan or bilateral lower extremity venous Doppler ultrasound may be performed as clinically indicated. 3.  Cardiomegaly and enlargement of the pulmonary arteries suggestive of pulmonary arterial hypertension.  Further evaluation with cardiac echo may be performed as clinically indicated. 4.  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  Further evaluation with PFTs and/or referral to Pulmonology may be performed as clinically indicated.  5.  Apparent enlargement of the left lobe of the thyroid with mass effect upon the trachea.  Further evaluation with dedicated thyroid ultrasound in the nonemergent setting is recommended.   Original Report Authenticated By: Waynard Reeds, M.D.    Dg Chest Portable 1 View  01/16/2012  *RADIOLOGY REPORT*  Clinical Data: Chest pressure.  Shortness of breath.  PORTABLE CHEST - 1 VIEW  Comparison: PA and lateral chest 08/08/2010.  Findings: There is  cardiomegaly and pulmonary vascular congestion. No consolidative process, pneumothorax or effusion is identified.  IMPRESSION: No acute finding.  Cardiomegaly and pulmonary vascular congestion.   Original Report Authenticated By: Bernadene Bell. D'ALESSIO, M.D.     Scheduled Meds:   . buPROPion  150 mg Oral Daily  . dextromethorphan-guaiFENesin  1 tablet Oral BID  . enoxaparin (LOVENOX) injection  40 mg Subcutaneous Q24H  . ferrous sulfate  325 mg Oral BID WC  . furosemide  80 mg Intravenous BID  . ibuprofen  600 mg Oral Once  . influenza  inactive virus vaccine  0.5  mL Intramuscular Tomorrow-1000  . insulin aspart  0-15 Units Subcutaneous TID WC  . insulin glargine  80 Units Subcutaneous QHS  . irbesartan  150 mg Oral Daily  . labetalol  10 mg Intravenous Once  . labetalol  10 mg Intravenous Once  . loratadine  10 mg Oral Daily  . multivitamin with minerals  1 tablet Oral QHS  . omega-3 acid ethyl esters  1 g Oral BID  . pantoprazole  40 mg Oral Daily  . potassium chloride  40 mEq Oral Once  . potassium chloride  40 mEq Oral Once  . predniSONE  40 mg Oral Q breakfast  . sertraline  100 mg Oral Daily  . simvastatin  20 mg Oral QPM  . sodium chloride  3 mL Intravenous Q12H  . sodium chloride  3 mL Intravenous Q12H  . spironolactone  25 mg Oral Daily  . DISCONTD: furosemide  40 mg Intravenous BID  . DISCONTD: furosemide  40 mg Intravenous Daily  . DISCONTD: furosemide  40 mg Intravenous BID  . DISCONTD: insulin glargine  60 Units Subcutaneous QHS  . DISCONTD: predniSONE  50 mg Oral Q breakfast   Continuous Infusions:   Principal Problem:  *Hypertensive urgency Active Problems:  DIABETES MELLITUS, TYPE II  HYPERLIPIDEMIA  DEPRESSION  GERD  OBESITY, MORBID  CHF (congestive heart failure)  Chest pain  Anxiety  Acute diastolic heart failure  OSA (obstructive sleep apnea)    Time spent: 30    Elspeth Blucher, [facility]  [facility] Pager [phone]. If 8PM-8AM, please contact  night-coverage at [email or web address], password [facility] 01/17/2012, 4:56 PM  LOS: 1 day

## 2012-01-17 NOTE — Progress Notes (Signed)
CARDIOLOGY CONSULT NOTE  Patient ID: ASMARA BACKS, MRN: [id number], DOB/AGE: 1966-01-02 46 y.o. Admit date: 01/16/2012   Date of Consult: 01/17/2012 Primary Physician: Roxy Manns, MD Primary Cardiologist: seen by Dr. Abigail Miyamoto remotely in 2006   HPI: Ms. Burruel is a 46 y/o F who works in the Radio producer at [employer]. She has a hx of DM, HTN, HL, morbid obesity, OSA noncompliant with CPAP due to finances, anxiety whose cardiac hx includes absence of coronary artery disease on cath in 2006 for CP with only catheter-induced spasm of RCA. EF was 65% at that time. She reports med compliance, but does note that her DM is uncontrolled and that BP runs very high. She notes hx of asthma (fairly unremarkable PFTs 11/2010).   She was admitted yesterday with atypical CP and dyspnea in the setting of marked HTN. pBNP 503. POC troponin neg x1. ?glu 212, ?WBC 10.7, ?Hgb 11.8. CXR shows cardiomegaly and pulm vasc congestion but otherwise no acute finding. Most recent BP 148/92.  Remains on IV lasix -2L. Denies orthopnea or PND. Says she cant walk far. + productive cough. Echo done this am. CE -.  Past Medical History  Diagnosis Date  . Anemia     iron deficiency  . Asthma     a. 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  . Depression     a. Stress reaction 08/2011 in multiple social stressors.  . Diabetes mellitus   . Hypertension   . Morbid obesity   . Hyperlipidemia   . Vertigo   . PCOS (polycystic ovarian syndrome)   . Gastritis   . Insomnia   . Complication of anesthesia     DIFFICULT WAKING  . GERD (gastroesophageal reflux disease)      Most Recent Cardiac Studies: Cardiac Cath 09/2004 INDICATIONS: Ms. Helbling is a 46 year old woman with a history of  hypertension, obesity and recent chest discomfort. This test is requested to  evaluate the coronary anatomy and assess for any potential  revascularizations strategies.  PROCEDURES  PERFORMED:  1. Left heart catheterization.  2. Selective coronary angiography.  3. Left ventriculography.  ACCESS AND EQUIPMENT: The area about the right femoral artery was anesthetized 1% lidocaine. A 6-French sheath was placed in the right femoral artery via modified Seldinger technique. Standard preformed 6-French JL-4 and JR-4 catheters were used for selective coronary angiography and an angled pigtail catheter was used for left heart catheterization and left ventriculography. All exchanges were made over wire. The patient tolerated  the procedure well without any complications. Marland Kitchen  HEMODYNAMIC RESULTS: Left ventricle 131/15. Aorta 131/80.  ANGIOGRAPHIC FINDINGS:  1. Left main coronary artery is free of significant flow-limiting coronary atherosclerosis.  2. The left anterior descending is a medium to large caliber vessel with one large bifurcating diagonal branch noted proximally. There is no significant flow-limiting coronary atherosclerosis noted.  3. The left circumflex is large and dominant. There is also a large ramus intermedius branch. No significant flow-limiting coronary atherosclerosis noted.  4. Right coronary artery is relatively small and nondominant. They are two medium sized right ventricular branch is noted. No flow-limiting coronary atherosclerosis noted. The patient did have some catheter induced spasm of the right coronary artery which resolved with 150 micrograms of intracoronary nitroglycerin.  Left ventriculography was performed in the RAO projection and revealed ejection fraction approximately 65% with trace mitral regurgitation in the setting of ventricular ectopy.  DIAGNOSES:  1. No significant flow-limiting coronary atherosclerosis  noted within the major epicardial vessels. There was some catheter-induced spasm of the right coronary artery which improved with intracoronary nitroglycerin. Doubt that this is clinically significant and more related to catheter manipulation.    2. Left ventricular ejection fraction approximately 65%. The left ventricular end-diastolic pressure of 15 mmHg and trace mitral regurgitation in the setting of ventricular ectopy.   Surgical History:  Past Surgical History  Procedure Date  . Cholecystectomy   . Hemorrhoid surgery   . Tubal ligation   . Shoulder surgery 06/2009  . Tonsillectomy and adenoidectomy CHILD  . Intrauterine device insertion 2011    Home Meds: Prior to Admission medications   Medication Sig Start Date End Date Taking? Authorizing Provider  albuterol (PROAIR HFA) 108 (90 BASE) MCG/ACT inhaler Inhale 2 puffs into the lungs every 4 (four) hours as needed. For wheezing   Yes Historical Provider, MD  buPROPion (WELLBUTRIN XL) 150 MG 24 hr tablet Take 150 mg by mouth daily.   Yes Historical Provider, MD  cetirizine (ZYRTEC) 10 MG tablet Take 10 mg by mouth at bedtime.     Yes Historical Provider, MD  ferrous sulfate 325 (65 FE) MG tablet Take 325 mg by mouth 2 (two) times daily.     Yes Historical Provider, MD  glimepiride (AMARYL) 4 MG tablet Take 4 mg by mouth daily before breakfast.   01/21/11  Yes Judy Pimple, MD  insulin glargine (LANTUS SOLOSTAR) 100 UNIT/ML injection Inject 60 Units into the skin at bedtime. 05/28/11  Yes Judy Pimple, MD  metFORMIN (GLUCOPHAGE) 1000 MG tablet Take 1 tablet (1,000 mg total) by mouth 2 (two) times daily with a meal. 05/28/11  Yes Judy Pimple, MD  Multiple Vitamin (MULTIVITAMIN) capsule Take 1 capsule by mouth at bedtime.    Yes Historical Provider, MD  Omega-3 Fatty Acids (FISH OIL PO) Take 2 capsules by mouth daily.     Yes Historical Provider, MD  pantoprazole (PROTONIX) 40 MG tablet Take 40 mg by mouth daily.   Yes Historical Provider, MD  sertraline (ZOLOFT) 50 MG tablet Take 2 tablets (100 mg total) by mouth daily. 08/30/11  Yes Judy Pimple, MD  simvastatin (ZOCOR) 20 MG tablet Take 20 mg by mouth every evening.   Yes Historical Provider, MD  telmisartan (MICARDIS) 80 MG  tablet Take 1 tablet (80 mg total) by mouth daily. 05/28/11  Yes Judy Pimple, MD  zolpidem (AMBIEN) 10 MG tablet Take 0.5-1 tablets (5-10 mg total) by mouth at bedtime as needed. For sleep 08/30/11  Yes Judy Pimple, MD   Inpatient Medications:     . buPROPion  150 mg Oral Daily  . enoxaparin (LOVENOX) injection  40 mg Subcutaneous Q24H  . ferrous sulfate  325 mg Oral BID WC  . furosemide  40 mg Intravenous BID  . ibuprofen  600 mg Oral Once  . influenza  inactive virus vaccine  0.5 mL Intramuscular Tomorrow-1000  . insulin aspart  0-15 Units Subcutaneous TID WC  . insulin glargine  60 Units Subcutaneous QHS  . irbesartan  150 mg Oral Daily  . labetalol  10 mg Intravenous Once  . labetalol  10 mg Intravenous Once  . loratadine  10 mg Oral Daily  . multivitamin with minerals  1 tablet Oral QHS  . omega-3 acid ethyl esters  1 g Oral BID  . pantoprazole  40 mg Oral Daily  . potassium chloride  40 mEq Oral Once  . potassium chloride  40  mEq Oral Once  . sertraline  100 mg Oral Daily  . simvastatin  20 mg Oral QPM  . sodium chloride  3 mL Intravenous Q12H  . sodium chloride  3 mL Intravenous Q12H  . DISCONTD: aspirin EC  325 mg Oral Daily  . DISCONTD: furosemide  40 mg Intravenous BID  . DISCONTD: furosemide  40 mg Intravenous Daily  . DISCONTD: multivitamin  1 capsule Oral QHS   Allergies:  Allergies  Allergen Reactions  . Buspirone Hcl Nausea Only  . Glipizide     REACTION: sleepy, low sugar  . Oxycodone-Acetaminophen Itching   History   Social History  . Marital Status: Married    Spouse Name: N/A    Number of Children: N/A  . Years of Education: N/A   Occupational History  . Not on file.   Social History Main Topics  . Smoking status: Former Smoker -- 0.2 packs/day for 5 years    Quit date: 03/25/2004  . Smokeless tobacco: Never Used  . Alcohol Use: No  . Drug Use: No  . Sexually Active: Yes   Other Topics Concern  . Not on file   Social History Narrative    . No narrative on file    Family History  Problem Relation Age of Onset  . Cancer Father     colon cancer  . Arthritis Father   . Arthritis Mother   . Asthma Mother   . Hypertension Mother      Review of Systems: General: negative for chills, fever. Has felt chilly. Has had night sweats (chronically) +15lb weight gain since June 2013. Cardiovascular: see above Dermatological: negative for rash Respiratory: negative for wheezing but lightly productive clear cough Urologic: negative for hematuria Abdominal: see above. +rectal bleeding Neurologic: negative for visual changes, syncope, or dizziness All other systems reviewed and are otherwise negative except as noted above.  Labs: poc trop neg Lab Results  Component Value Date   WBC 12.9* 01/17/2012   HGB 11.4* 01/17/2012   HCT 37.1 01/17/2012   MCV 87.9 01/17/2012   PLT 290 01/17/2012     Lab 01/17/12 0237  NA 138  K 3.4*  CL 95*  CO2 33*  BUN 12  CREATININE 0.71  CALCIUM 9.3  PROT 7.5  BILITOT 0.3  ALKPHOS 111  ALT 38*  AST 29  GLUCOSE 216*   Lab Results  Component Value Date   CHOL 138 01/17/2012   HDL 38* 01/17/2012   LDLCALC 78 01/17/2012   TRIG 108 01/17/2012   Radiology/Studies:  Dg Chest Portable 1 View 01/16/2012  *RADIOLOGY REPORT*  Clinical Data: Chest pressure.  Shortness of breath.  PORTABLE CHEST - 1 VIEW  Comparison: PA and lateral chest 08/08/2010.  Findings: There is cardiomegaly and pulmonary vascular congestion. No consolidative process, pneumothorax or effusion is identified.  IMPRESSION: No acute finding.  Cardiomegaly and pulmonary vascular congestion.   Original Report Authenticated By: Bernadene Bell. D'ALESSIO, M.D.    EKG: sinus tach 106bpm biatrial enlargment, rightward axis, nonspecific ST-T changes No prior to compare to  Physical Exam: Blood pressure 126/84, pulse 84, temperature 98 F (36.7 C), temperature source Oral, resp. rate 20, height 5\' 5"  (1.651 m), weight 136.986 kg (302  lb), SpO2 91.00%. General morbidly obese WF in no acute distress. Head: Normocephalic, atraumatic, sclera non-icteric, no xanthomas, nares are without discharge.  Neck: Very thick unable to assess JVD.  Negative for carotid bruits.  Lungs: Clear bilaterally to auscultation without wheezes, rales, or rhonchi.  Breathing is unlabored. Heart: RRR with S1 S2. No murmurs, rubs, or gallops appreciated. Pt is able to reproduce chest pain on herself on epigastric/left sternal region. Abdomen: Soft, obese/rounded, non-distended with normoactive bowel sounds. No hepatomegaly. No rebound/guarding.  Msk:  Strength and tone appear normal for age. Extremities: No clubbing or cyanosis. 1+ LE edema   Distal pedal pulses are 2+ and equal bilaterally. Neuro: Alert and oriented X 3. No facial asymmetry. No focal deficit. Moves all extremities spontaneously. Psych:  Responds to questions appropriately with a normal affect.   Assessment  1. Dyspnea - suspect acute diastolic HF and obesity hypoventialtion syndrome 2. Chest wall pain 3. Uncontrolled HTN 4. Diabetes mellitus 5. Morbid obesity BMI 50.6 6. Severe OSA by sleep study 2012 - noncompliant with CPAP due to finances. SW to get involved for possible assistance. Placing CPAP in hospital.  7. Rectal bleeding with hx of IDA, colon polyps, family hx of colon cancer 8. PCOS   Plan/Discussion:  I suspect the major issue here is her obesity but also appears to have mild diastolic HF on top of it in setting of significant HTN. Increase IV lasix to 80 bid and add spiro 25. Will review echo. Can increase ARB or add amlodipine as needed to help control BP.   CP does not appear ischemic. Once diuresed can be discharged home in next day or so if echo normal. Long talk about need to lose weight and get CPAP.   Arvilla Meres, MD 01/17/2012, 12:50 PM

## 2012-01-18 DIAGNOSIS — K219 Gastro-esophageal reflux disease without esophagitis: Secondary | ICD-10-CM

## 2012-01-18 DIAGNOSIS — E079 Disorder of thyroid, unspecified: Secondary | ICD-10-CM

## 2012-01-18 DIAGNOSIS — G4733 Obstructive sleep apnea (adult) (pediatric): Secondary | ICD-10-CM

## 2012-01-18 LAB — BASIC METABOLIC PANEL
BUN: 12 mg/dL (ref 6–23)
CO2: 37 mEq/L — ABNORMAL HIGH (ref 19–32)
Chloride: 93 mEq/L — ABNORMAL LOW (ref 96–112)
GFR calc non Af Amer: >90 mL/min (ref >90)
Glucose, Bld: 162 mg/dL — ABNORMAL HIGH (ref 70–99)
Potassium: 3.3 mEq/L — ABNORMAL LOW (ref 3.5–5.1)

## 2012-01-18 LAB — GLUCOSE, CAPILLARY
Glucose-Capillary: 127 mg/dL — ABNORMAL HIGH (ref 70–99)
Glucose-Capillary: 274 mg/dL — ABNORMAL HIGH (ref 70–99)
Glucose-Capillary: 212 mg/dL — ABNORMAL HIGH (ref 70–99)

## 2012-01-18 MED ORDER — BISOPROLOL FUMARATE 5 MG PO TABS
5.0000 mg | ORAL_TABLET | Freq: Every day | ORAL | Status: DC
  Administered 2012-01-18 – 2012-01-22 (×5): 5 mg via ORAL
  Filled 2012-01-18 (×5): qty 1

## 2012-01-18 MED ORDER — POTASSIUM CHLORIDE CRYS ER 20 MEQ PO TBCR
40.0000 meq | EXTENDED_RELEASE_TABLET | Freq: Once | ORAL | Status: AC
  Administered 2012-01-18: 40 meq via ORAL
  Filled 2012-01-18: qty 2

## 2012-01-18 MED ORDER — FUROSEMIDE 10 MG/ML IJ SOLN
80.0000 mg | Freq: Once | INTRAMUSCULAR | Status: AC
  Administered 2012-01-18: 80 mg via INTRAVENOUS
  Filled 2012-01-18: qty 8

## 2012-01-18 MED ORDER — OXYMETAZOLINE HCL 0.05 % NA SOLN
2.0000 | NASAL | Status: AC
  Administered 2012-01-19: 2 via NASAL
  Filled 2012-01-18: qty 15

## 2012-01-18 MED ORDER — CARVEDILOL 6.25 MG PO TABS
6.2500 mg | ORAL_TABLET | Freq: Two times a day (BID) | ORAL | Status: DC
  Filled 2012-01-18 (×2): qty 1

## 2012-01-18 MED ORDER — FUROSEMIDE 40 MG PO TABS
40.0000 mg | ORAL_TABLET | Freq: Every day | ORAL | Status: DC
  Filled 2012-01-18: qty 1

## 2012-01-18 MED ORDER — PANTOPRAZOLE SODIUM 40 MG PO TBEC
40.0000 mg | DELAYED_RELEASE_TABLET | Freq: Two times a day (BID) | ORAL | Status: DC
  Administered 2012-01-18 – 2012-01-22 (×9): 40 mg via ORAL
  Filled 2012-01-18 (×9): qty 1

## 2012-01-18 NOTE — Evaluation (Signed)
Physical Therapy Evaluation Patient Details Name: [name] MRN: [id number] DOB: 31-Jan-1966 Today's Date: 01/18/2012 Time: 4098-1191 PT Time Calculation (min): 31 min  PT Assessment / Plan / Recommendation Clinical Impression  Patient is a 46 yo female admitted with DOE, CHF, LE edema, and HTN.  Mobility limited by DOE and decrease in O2 sat to 82% on room air with gait (reapplied O2 and sat increased to 96% within 1 minute).  Patient with fairly good mobility.  However, she does have 25 stairs to climb at work.  Patient will benefit from acute PT to maximize independence prior to discharge.    PT Assessment  Patient needs continued PT services    Follow Up Recommendations  No PT follow up;Supervision - Intermittent    Does the patient have the potential to tolerate intense rehabilitation      Barriers to Discharge None      Equipment Recommendations  None recommended by PT    Recommendations for Other Services     Frequency Min 3X/week    Precautions / Restrictions Precautions Precautions: None Restrictions Weight Bearing Restrictions: No   Pertinent Vitals/Pain       Mobility  Transfers Transfers: Sit to Stand;Stand to Sit Sit to Stand: 5: Supervision;With upper extremity assist;From bed Stand to Sit: 5: Supervision;With upper extremity assist;To bed Details for Transfer Assistance: No cues or assist needed.  Supervision for safety only. Ambulation/Gait Ambulation/Gait Assistance: 5: Supervision Ambulation Distance (Feet): 220 Feet Assistive device: None Ambulation/Gait Assistance Details: Patient with dyspnea up to 4/4 with gait on RA.  O2 sat dropped to 82%.  O2 reapplies and O2 sat increased to 96 within 1 minute.  Gait fairly steady - staggering gait x2 with patient able to self-correct.  Required 2 standing rest breaks due to dyspnea. Gait Pattern: Step-through pattern;Decreased stride length;Wide base of support Gait velocity: Slow gait speed           PT Diagnosis: Abnormality of gait  PT Problem List: Decreased activity tolerance;Decreased balance;Decreased mobility;Cardiopulmonary status limiting activity;Obesity PT Treatment Interventions: Gait training;Stair training;Functional mobility training;Balance training;Patient/family education   PT Goals Acute Rehab PT Goals PT Goal Formulation: With patient Time For Goal Achievement: 01/25/12 Potential to Achieve Goals: Good Pt will go Sit to Stand: Independently PT Goal: Sit to Stand - Progress: Goal set today Pt will go Stand to Sit: Independently PT Goal: Stand to Sit - Progress: Goal set today Pt will Ambulate: >150 feet;Independently (without loss of balance, with dyspnea 2/4 or less.) PT Goal: Ambulate - Progress: Goal set today Pt will Go Up / Down Stairs: 6-9 stairs;with supervision;with rail(s) PT Goal: Up/Down Stairs - Progress: Goal set today  Visit Information  Last PT Received On: 01/18/12 Assistance Needed: +1    Subjective Data  Subjective: "I am getting short of breath any time I move around.  It's better than yesterday" Patient Stated Goal: To go home   Prior Functioning  Home Living Lives With: Spouse;Son Available Help at Discharge: Family;Available PRN/intermittently Type of Home: House Home Access: Stairs to enter [employer] of Steps: 2 Entrance Stairs-Rails: None Home Layout: One level Bathroom Shower/Tub: Health visitor: Standard Home Adaptive Equipment: Crutches Prior Function Level of Independence: Independent Able to Take Stairs?: Yes Driving: Yes Vocation: Full time employment (Has to walk up 25 stairs at work) Musician: No difficulties    Cognition  Overall Cognitive Status: Appears within functional limits for tasks assessed/performed Arousal/Alertness: Awake/alert Orientation Level: Oriented X4 / Intact Behavior  During Session: [facility] for tasks performed    Extremity/Trunk Assessment  Right Upper Extremity Assessment RUE ROM/Strength/Tone: [facility] for tasks assessed RUE Sensation: [facility] - Light Touch Left Upper Extremity Assessment LUE ROM/Strength/Tone: WFL for tasks assessed LUE Sensation: WFL - Light Touch Right Lower Extremity Assessment RLE ROM/Strength/Tone: WFL for tasks assessed RLE Sensation: WFL - Light Touch Left Lower Extremity Assessment LLE ROM/Strength/Tone: WFL for tasks assessed LLE Sensation: WFL - Light Touch   Balance Balance Balance Assessed: Yes High Level Balance High Level Balance Activites: Direction changes;Turns;Sudden stops;Head turns (Walking around obstacles; Stepping over objects) High Level Balance Comments: Patient without loss of balance with advanced balance activities.  End of Session PT - End of Session Activity Tolerance: Patient limited by fatigue;Treatment limited secondary to medical complications (Comment) (DOE with decrease in O2 sat.) Patient left: in bed;with call bell/phone within reach;with family/visitor present (sitting on EOB) Nurse Communication: Mobility status (Drop in O2 sat on RA.  O2 reapplied.)  GP     Vena Austria 01/18/2012, 11:04 AM Durenda Hurt. Renaldo Fiddler, [facility] Acute Rehab Services Pager [phone]

## 2012-01-18 NOTE — Consult Note (Deleted)
    Name: [name] MRN: [id number] DOB: 03/19/1966    LOS: 2   History of Present Illness:  Pulm - [facility] Reason for consult - dyspnea, tracheomalacia 46 y.o obese ex smoker with DM, HTN, HL, OSA not on CPAP, was admitted 10/24 with essentially 2 week h/o nasal congestion,  progressive dyspnea, wt gain, lower extremity edema, atypical CP and dyspnea w/ marked HTN. pBNP 503. POC troponin neg x1. ?glu 212, ?WBC 10.7, ?Hgb 11.8. CXR showed cardiomegaly and pulm vasc congestion but otherwise no acute finding.  Was admitted to medical service w/ cardiology consultation. Treatment has included: diuresis, CPAP while in hospital  and supplemental oxygen. PCCM asked to assess for dyspnea.  CT angio neg for PE but large left goiter wrapping around trachea with some mass effect. Review of PFTs 12/07/10 does not suggest any truncation  She notes childhood hx of asthma, but [name] last note reports more likely upper airway disease . She has coronary artery disease on cath in 2006 for CP with only catheter-induced spasm of RCA. EF was 65% at that time. She reports med compliance, but does note that her DM is uncontrolled and that BP runs very high.  Tests / Events: 10/25: Poorly visualized. Wall thickness wasincreased in a pattern of moderate LVH. Systolic function was normal. The estimated ejection fraction was in the rangeof 50% to 55%. Images were inadequate for LV wall motion assessment. Features are consistent with a pseudonormal left ventricular filling pattern, with concomitant abnormal relaxation and increased filling pressure (grade 2 diastolic dysfunction).  CT 10/24:  No evidence of central pulmonary embolism. Cardiomegaly and enlargement of the pulmonary arteries  suggestive of pulmonary arterial hypertension. Nonspecific findings suggestive of possible tracheomalacia  Apparent enlargement of the left lobe of the thyroid with mass effect upon the trachea.     Vital Signs: BP 146/80   Pulse 84  Temp 98.3 F (36.8 C) (Oral)  Resp 20  Ht 5' 5" (1.651 m)  Wt 302 lb (136.986 kg)  BMI 50.26 kg/m2  SpO2 98%  FIO2 2lpm       Intake/Output Summary (Last 24 hours) at 01/18/12 0932 Last data filed at 01/18/12 0500  Gross per 24 hour  Intake    720 ml  Output   5300 ml  Net  -4580 ml    Physical Examination: General: obese white female, no acute distress.  Neuro:  Awake, oriented, no focal def   HEENT:  Neck is large, difficult to differentiate thyroid from excessive tissue + upper airway faint wheeze at end expiration - no stridor Neck:  Large  Cardiovascular:  rrr Lungs:  Decreased  Abdomen:  Obese  Musculoskeletal:  Intact  Skin:  + lower extremity edema       Labs and Imaging:   Lab 01/17/12 0237 01/16/12 1616 01/16/12 1043  NA 138 -- 138  K 3.4* -- 3.7  CL 95* -- 97  CO2 33* -- 29  BUN 12 -- 9  CREATININE 0.71 0.62 0.60  GLUCOSE 216* -- 212*    Lab 01/17/12 0237 01/16/12 1616 01/16/12 1043  HGB 11.4* 11.1* 11.8*  HCT 37.1 35.4* 37.6  WBC 12.9* 10.4 10.7*  PLT 290 287 268    Assessment and Plan: Dyspnea. Multifactorial: agree, think this is mostly a complication of her morbid obesity, resultant  OSA +/- OHS, secondary PAH, and diastolic heart dysfxn. She seems to be responding favorably to diuretics. She does have an enlarged thyroid which appears to   be having mass effect on left lobe which may be resulting in some airway obstruction but unclear on how much this contributes to her dyspnea. Doubt that the radiographic suggestion of tracheal malacia is a significant contributor to her symptoms. Her prev PFTs do not support significant airflow limitations insp or exp but they are a year old and need to be repeated at outpt f/u  Recommendations OSA -Auto CPAP 10-20 cm with nasal mask, humidity on discharge, observe her tonight on this  Diastolic heart failure -need to determine ambulatory oxygen needs-->need to avoid hypoxia  -agree w/ diuretics  as you are doing Feel this will be a lot better if OSA is adequately treated  Acute bronchitis - symptomatically improved Prednisone can be rapidly tapered to off in 7-10 days, for fear of worsening her sugars  Goiter -will repeat thyroid testing--> do not think that any primary intervention will be required at this point, as this is not the primary issue, but may be something to consider in future. In the short term CPAP would help with potential additional obstruction that thyroid might be adding.   Symptoms are markedly disproportionate to objective findings and not clear this is a lung problem but pt does appear to have difficult airway management issues. DDX of  difficult airways managment all start with A and  include Adherence, Ace Inhibitors, Acid Reflux, Active Sinus Disease, Alpha 1 Antitripsin deficiency, Anxiety masquerading as Airways dz,  ABPA,  allergy(esp in young), Aspiration (esp in elderly), Adverse effects of DPI,  Active smokers, plus two Bs  = Bronchiectasis and Beta blocker use..and one C= CHF  ? Acid reflux > max rx and no fish oil  ? Beta blocker effect > Strongly prefer in this setting: Bystolic, the most beta -1  selective Beta blocker available in sample form, with bisoprolol the most selective generic choice  on the market.   Henryetta Corriveau, MD Pulmonary and Critical Care Medicine [facility] Cell [phone]   

## 2012-01-18 NOTE — Progress Notes (Signed)
Name: [name] MRN: [id number] DOB: Feb 14, 1966    LOS: 2   History of Present Illness:  Pulm [name] Reason for consult - dyspnea, tracheomalacia 46 y.o obese ex smoker with DM, HTN, HL, OSA not on CPAP, was admitted 10/24 with essentially 2 week h/o nasal congestion,  progressive dyspnea, wt gain, lower extremity edema, atypical CP and dyspnea w/ marked HTN. pBNP 503. POC troponin neg x1. ?glu 212, ?WBC 10.7, ?Hgb 11.8. CXR showed cardiomegaly and pulm vasc congestion but otherwise no acute finding.  Was admitted to medical service w/ cardiology consultation. Treatment has included: diuresis, CPAP while in hospital  and supplemental oxygen. PCCM asked to assess for dyspnea.  CT angio neg for PE but large left goiter wrapping around trachea with some mass effect. Review of PFTs 12/07/10 does not suggest any truncation  She notes childhood hx of asthma, but [name] last note reports more likely upper airway disease . She has coronary artery disease on cath in 2006 for CP with only catheter-induced spasm of RCA. EF was 65% at that time. She reports med compliance, but does note that her DM is uncontrolled and that BP runs very high.  Tests / Events: 10/25: Poorly visualized. Wall thickness wasincreased in a pattern of moderate LVH. Systolic function was normal. The estimated ejection fraction was in the rangeof 50% to 55%. Images were inadequate for LV wall motion assessment. Features are consistent with a pseudonormal left ventricular filling pattern, with concomitant abnormal relaxation and increased filling pressure (grade 2 diastolic dysfunction).  CT 10/24:  No evidence of central pulmonary embolism. Cardiomegaly and enlargement of the pulmonary arteries  suggestive of pulmonary arterial hypertension. Nonspecific findings suggestive of possible tracheomalacia  Apparent enlargement of the left lobe of the thyroid with mass effect upon the trachea.     Vital Signs: BP 146/80   Pulse 84  Temp 98.3 F (36.8 C) (Oral)  Resp 20  Ht 5\' 5"  (1.651 m)  Wt 302 lb (136.986 kg)  BMI 50.26 kg/m2  SpO2 98%  FIO2 2lpm       Intake/Output Summary (Last 24 hours) at 01/18/12 0932 Last data filed at 01/18/12 0500  Gross per 24 hour  Intake    720 ml  Output   5300 ml  Net  -4580 ml    Physical Examination: General: obese white female, no acute distress.  Neuro:  Awake, oriented, no focal def   HEENT:  Neck is large, difficult to differentiate thyroid from excessive tissue + upper airway faint wheeze at end expiration - no stridor Neck:  Large  Cardiovascular:  rrr Lungs:  Decreased  Abdomen:  Obese  Musculoskeletal:  Intact  Skin:  + lower extremity edema       Labs and Imaging:   Lab 01/17/12 0237 01/16/12 1616 01/16/12 1043  NA 138 -- 138  K 3.4* -- 3.7  CL 95* -- 97  CO2 33* -- 29  BUN 12 -- 9  CREATININE 0.71 0.62 0.60  GLUCOSE 216* -- 212*    Lab 01/17/12 0237 01/16/12 1616 01/16/12 1043  HGB 11.4* 11.1* 11.8*  HCT 37.1 35.4* 37.6  WBC 12.9* 10.4 10.7*  PLT 290 287 268    Assessment and Plan: Dyspnea. Multifactorial: agree, think this is mostly a complication of her morbid obesity, resultant  OSA +/- OHS, secondary PAH, and diastolic heart dysfxn. She seems to be responding favorably to diuretics. She does have an enlarged thyroid which appears to  be having mass effect on left lobe which may be resulting in some airway obstruction but unclear on how much this contributes to her dyspnea. Doubt that the radiographic suggestion of tracheal malacia is a significant contributor to her symptoms. Her prev PFTs do not support significant airflow limitations insp or exp but they are a year old and need to be repeated at outpt f/u  Recommendations OSA -Auto CPAP 10-20 cm with nasal mask, humidity on discharge, observe her tonight on this  Diastolic heart failure -need to determine ambulatory oxygen needs-->need to avoid hypoxia  -agree w/ diuretics  as you are doing Feel this will be a lot better if OSA is adequately treated  Acute bronchitis - symptomatically improved Prednisone can be rapidly tapered to off in 7-10 days, for fear of worsening her sugars  Goiter -will repeat thyroid testing--> do not think that any primary intervention will be required at this point, as this is not the primary issue, but may be something to consider in future. In the short term CPAP would help with potential additional obstruction that thyroid might be adding.   Symptoms are markedly disproportionate to objective findings and not clear this is a lung problem but pt does appear to have difficult airway management issues. DDX of  difficult airways managment all start with A and  include Adherence, Ace Inhibitors, Acid Reflux, Active Sinus Disease, Alpha 1 Antitripsin deficiency, Anxiety masquerading as Airways dz,  ABPA,  allergy(esp in young), Aspiration (esp in elderly), Adverse effects of DPI,  Active smokers, plus two Bs  = Bronchiectasis and Beta blocker use..and one C= CHF  ? Acid reflux > max rx and no fish oil  ? Beta blocker effect > Strongly prefer in this setting: Bystolic, the most beta -1  selective Beta blocker available in sample form, with bisoprolol the most selective generic choice  on the market.   Sandrea Hughs, MD Pulmonary and Critical Care Medicine [facility] Cell [phone]

## 2012-01-18 NOTE — Progress Notes (Signed)
Patient Name: [name]      SUBJECTIVE: less sob stil with cough   Past Medical History  Diagnosis Date  . Anemia     iron deficiency  . Asthma     a. 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  . Depression     a. Stress reaction 08/2011 in multiple social stressors.  . Diabetes mellitus   . Hypertension   . Morbid obesity   . Hyperlipidemia   . Vertigo   . PCOS (polycystic ovarian syndrome)   . Gastritis   . Insomnia   . Complication of anesthesia     DIFFICULT WAKING  . GERD (gastroesophageal reflux disease)   . OSA (obstructive sleep apnea) 01/17/2012    PHYSICAL EXAM Filed Vitals:   01/17/12 2100 01/18/12 0600 01/18/12 0836 01/18/12 0914  BP: 158/81 157/80 146/80   Pulse: 89 86 80 84  Temp: 98 F (36.7 C) 98.3 F (36.8 C)    TempSrc:      Resp: 18 20  20   Height:      Weight:      SpO2: 94% 92%  98%      Well developed and morbbidly obese mod resp distress HENT normal Neck supple with JVP-10 Clear Regular rate and rhythm, 2/6 systolic m Abd-soft with active BS No Clubbing cyanosis 2+ edema Skin-warm and dry A & Oriented  Grossly normal sensory and motor function  TELEMETRY: Reviewed telemetry pt in nsr   Intake/Output Summary (Last 24 hours) at 01/18/12 1034 Last data filed at 01/18/12 0500  Gross per 24 hour  Intake    720 ml  Output   5300 ml  Net  -4580 ml    LABS: Basic Metabolic Panel:  Lab 01/17/12 1610 01/16/12 1616 01/16/12 1043  NA 138 -- 138  K 3.4* -- 3.7  CL 95* -- 97  CO2 33* -- 29  GLUCOSE 216* -- 212*  BUN 12 -- 9  CREATININE 0.71 0.62 0.60  CALCIUM 9.3 -- 9.2  MG -- -- --  PHOS -- -- --   Cardiac Enzymes:  Basename 01/17/12 0236 01/16/12 2108 01/16/12 1615  CKTOTAL -- -- --  CKMB -- -- --  CKMBINDEX -- -- --  TROPONINI <0.30 <0.30 <0.30   CBC:  Lab 01/17/12 0237 01/16/12 1616 01/16/12 1043  WBC 12.9* 10.4 10.7*  NEUTROABS -- -- --  HGB 11.4* 11.1* 11.8*  HCT  37.1 35.4* 37.6  MCV 87.9 86.8 86.6  PLT 290 287 268   PROTIME: No results found for this basename: LABPROT:3,INR:3 in the last 72 hours Liver Function Tests:  [facility] 01/17/12 0237  AST 29  ALT 38*  ALKPHOS 111  BILITOT 0.3  PROT 7.5  ALBUMIN 3.7   No results found for this basename: LIPASE:2,AMYLASE:2 in the last 72 hours BNP: BNP (last 3 results)  Basename 01/17/12 0237 01/16/12 1042  PROBNP 590.3* 503.2*   D-Dimer:  Basename 01/16/12 1616  DDIMER 0.59*   Hemoglobin A1C:  Basename 01/17/12 0237  HGBA1C 9.0*   Fasting Lipid Panel:  Basename 01/17/12 0237  CHOL 138  HDL 38*  LDLCALC 78  TRIG 960  CHOLHDL 3.6  LDLDIRECT --   ECHO *>> nl LV functionwith LVH-moderate PA pressure 68  lDL at goal   ASSESSMENT AND PLAN:  Patient Active Hospital Problem List: Hypertensive urgency (01/16/2012)   OBESITY, MORBID (04/27/2010)   Chest pain (01/16/2012)   Anxiety (01/16/2012)  * Acute diastolic  heart failure (01/17/2012)   OSA (obstructive sleep apnea) (01/17/2012)  Alkalosis and hypokalemia      Need to check BMET this am  Get dietary consult  Anticipate d/c monday  BP still elevated  Continue meds for now before furthers  OSA>>CPAP to be titrated      Signed, Sherryl Manges MD  01/18/2012

## 2012-01-18 NOTE — Consult Note (Deleted)
Name: [name] MRN: [id number] DOB: [birth year]    LOS: 2   History of Present Illness:  Pulm [name] Reason for consult - dyspnea, tracheomalacia 46 y.o obese ex smoker with DM, HTN, HL, OSA not on CPAP, was admitted 10/24 with essentially 2 week h/o nasal congestion,  progressive dyspnea, wt gain, lower extremity edema, atypical CP and dyspnea w/ marked HTN. pBNP 503. POC troponin neg x1. ?glu 212, ?WBC 10.7, ?Hgb 11.8. CXR showed cardiomegaly and pulm vasc congestion but otherwise no acute finding.  Was admitted to medical service w/ cardiology consultation. Treatment has included: diuresis, CPAP while in hospital  and supplemental oxygen. PCCM asked to assess for dyspnea.  CT angio neg for PE but large left goiter wrapping around trachea with some mass effect. Review of PFTs 12/07/10 does not suggest any truncation  She notes childhood hx of asthma, but [name] last note reports more likely upper airway disease . She has coronary artery disease on cath in 2006 for CP with only catheter-induced spasm of RCA. EF was 65% at that time. She reports med compliance, but does note that her DM is uncontrolled and that BP runs very high.  Tests / Events: 10/25: Poorly visualized. Wall thickness wasincreased in a pattern of moderate LVH. Systolic function was normal. The estimated ejection fraction was in the rangeof 50% to 55%. Images were inadequate for LV wall motion assessment. Features are consistent with a pseudonormal left ventricular filling pattern, with concomitant abnormal relaxation and increased filling pressure (grade 2 diastolic dysfunction).  CT 10/24:  No evidence of central pulmonary embolism. Cardiomegaly and enlargement of the pulmonary arteries  suggestive of pulmonary arterial hypertension. Nonspecific findings suggestive of possible tracheomalacia  Apparent enlargement of the left lobe of the thyroid with mass effect upon the trachea.     Vital Signs: BP 146/80   Pulse 84  Temp 98.3 F (36.8 C) (Oral)  Resp 20  Ht 5\' 5"  (1.651 m)  Wt 302 lb (136.986 kg)  BMI 50.26 kg/m2  SpO2 98%  FIO2 2lpm       Intake/Output Summary (Last 24 hours) at 01/18/12 4098 Last data filed at 01/18/12 0500  Gross per 24 hour  Intake    720 ml  Output   5300 ml  Net  -4580 ml    Physical Examination: General: obese white female, no acute distress.  Neuro:  Awake, oriented, no focal def   HEENT:  Neck is large, difficult to differentiate thyroid from excessive tissue + upper airway faint wheeze at end expiration - no stridor Neck:  Large  Cardiovascular:  rrr Lungs:  Decreased  Abdomen:  Obese  Musculoskeletal:  Intact  Skin:  + lower extremity edema       Labs and Imaging:   Lab 01/17/12 0237 01/16/12 1616 01/16/12 1043  NA 138 -- 138  K 3.4* -- 3.7  CL 95* -- 97  CO2 33* -- 29  BUN 12 -- 9  CREATININE 0.71 0.62 0.60  GLUCOSE 216* -- 212*    Lab 01/17/12 0237 01/16/12 1616 01/16/12 1043  HGB 11.4* 11.1* 11.8*  HCT 37.1 35.4* 37.6  WBC 12.9* 10.4 10.7*  PLT 290 287 268    Assessment and Plan: Dyspnea. Multifactorial: agree, think this is mostly a complication of her morbid obesity, resultant  OSA +/- OHS, secondary PAH, and diastolic heart dysfxn. She seems to be responding favorably to diuretics. She does have an enlarged thyroid which appears to  be having mass effect on left lobe which may be resulting in some airway obstruction but unclear on how much this contributes to her dyspnea. Doubt that the radiographic suggestion of tracheal malacia is a significant contributor to her symptoms. Her prev PFTs do not support significant airflow limitations insp or exp but they are a year old and need to be repeated at outpt f/u  Recommendations OSA -Auto CPAP 10-20 cm with nasal mask, humidity on discharge, observe her tonight on this  Diastolic heart failure -need to determine ambulatory oxygen needs-->need to avoid hypoxia  -agree w/ diuretics  as you are doing Feel this will be a lot better if OSA is adequately treated  Acute bronchitis - symptomatically improved Prednisone can be rapidly tapered to off in 7-10 days, for fear of worsening her sugars  Goiter -will repeat thyroid testing--> do not think that any primary intervention will be required at this point, as this is not the primary issue, but may be something to consider in future. In the short term CPAP would help with potential additional obstruction that thyroid might be adding.   Symptoms are markedly disproportionate to objective findings and not clear this is a lung problem but pt does appear to have difficult airway management issues. DDX of  difficult airways managment all start with A and  include Adherence, Ace Inhibitors, Acid Reflux, Active Sinus Disease, Alpha 1 Antitripsin deficiency, Anxiety masquerading as Airways dz,  ABPA,  allergy(esp in young), Aspiration (esp in elderly), Adverse effects of DPI,  Active smokers, plus two Bs  = Bronchiectasis and Beta blocker use..and one C= CHF  ? Acid reflux > max rx and no fish oil  ? Beta blocker effect > Strongly prefer in this setting: Bystolic, the most beta -1  selective Beta blocker available in sample form, with bisoprolol the most selective generic choice  on the market.   Monica Hughs, MD Pulmonary and Critical Care Medicine [facility] Cell [phone]

## 2012-01-18 NOTE — Progress Notes (Signed)
Pt currently resting in bed. No bleeding at this time. CPAP on. Will continue to monitor pt.

## 2012-01-18 NOTE — Plan of Care (Signed)
Problem: Food- and Nutrition-Related Knowledge Deficit (NB-1.1) Goal: Nutrition education Formal process to instruct or train a patient/client in a skill or to impart knowledge to help patients/clients voluntarily manage or modify food choices and eating behavior to maintain or improve health.  Outcome: Completed/Met Date Met:  01/18/12 Met with pt for >20 minutes discussing diabetic diet and healthy eating for weight loss. Pt stated she had been to diabetic classes before but it never "clicked". 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 Pt especially struggles with her beverage choices and is having a hard time cutting back on regular soda and sweet tea. Discussed at length sources of carbohydrates, recommended portion sizes, and sample menu and snack ideas. Pt asked multiple questions which were all appropriate. Teach back method used throughout session. Pt learns best from explanation and pictures/diagrams of what to eat. Provided pt with contact information of [facility] and information about their free support group classes held once a month. Encouraged attendance at discharge for additional nutrition follow up. Multiple nutrition handouts provided on diabetic diet and healthy eating for weight loss. Pt expressed understanding, expect good compliance as long as pt has follow up. Strongly encourage MD order outpatient nutrition follow up for further reinforcement of education.

## 2012-01-18 NOTE — Progress Notes (Signed)
Patient placed on cpap auto with 2lpm 02 bleed in. Patient is tolerating for now. RT will continue to monitor.

## 2012-01-18 NOTE — Progress Notes (Signed)
[facility] PROGRESS NOTE  [name] ZOX:[id number] DOB: [birth year] DOA: 01/16/2012 PCP: Roxy Manns, MD  Assessment/Plan:  Hypertensive urgency/malignant hypertension.  Blood pressure trending down.   -  Continue irbesartan 300mg  daily  -  Beta blocker started by pulmonology today  Dyspnea: likely due to OSA with secondary pulmonary arterial hypertension and diastolic dysfunction.  CTa was negative for PE.  ECHO demonstrated normal EF with grade 2 diastolic dysfunction and moderately dilated RV with peak PA pressure of .  Differential includes asthma (although per pulmonology patient had minimal obstruction on PFTs), tracheomalacia (also less likely per pulmonology), goiter compressing airway (possible), or allergy/reflux. -  Continue lasix 80mg  IV BID (diuresed 6L yesterday) -  Daily electrolytes during diuresis with strict I/O -  Appreciate excellent pulmonology differential regarding cough and SOB -  PPI increased for possible GERD -  Alazne Quant course of prednisone 50mg  daily, day 2/5 -  Duonebs q6h -  Albuterol as needed. -  CPAP tonight with titration then home with home CPAP  Thyromegaly with deviation of trachea on CT -  TSH and fT4 tomorrow AM  .DIABETES MELLITUS, TYPE II, uncontrolled.  CBGs improving with increased lantus.  Monitor for hyperglycemia on steroids. -  Continue Lantus 80units qhs -  Continue aspart 4 units AC -  Continue SSI    .HYPERLIPIDEMIA, stable. continue with Statins   .OBESITY, MORBID  -Counseled regarding the importance of weight loss.   Marland KitchenGERD Increased PPI   .DEPRESSION/anxiety, stable.  Continue with regular meds and xanax prn.  Hypokalemia: LIkely due to lasix.   -  Oral potassium once -  Repeat electrolytes in AM.  Rising BUN:Cr and bicarb.   -  Monitor volume status and lasix carefully  DIET:  Carbohydrate controlled ACCESS:  PIV IVF: None PROPH:  lovenox  Code Status: Full Family Communication: spoke with  patient and family regarding plan of care Disposition Plan: Pending stable blood pressure, improvement in dyspnea, and fingersticks controlled.  HPI:  Patient is a 46 year old Caucasian female with a past medical history of diabetes, hypertension, dyslipidemia, prior smoker (quit 2 years ago), morbid obesity, history of obstructive apnea noncompliant to CPAP (secondary to financial issues) who presented to the ED for evaluation of the above noted complaints. The patient for the past 2 weeks she has been having exertional dyspnea. She claims that even walking around the house makes [name] of breath. She claims that she does not get [name] of breath at rest, and the shortness of breath is exclusively on exertion. She claims that she is now unable to lie flat in is not sleeping on a couch. She claims that this has now been associated with worsening bilateral lower extremity edema. She claims she has gained approximately 15 pounds over the past 2 weeks. She also claims she's been having some chest pain for the past 2 weeks as well. Patient claims that the pain is mostly sharp and located in the lower sternal area, she claims that the pain is easily reproducible on deep palpation. There is no radiation of the pain, it is not associated with nausea vomiting. Patient claims that she has been under a lot of stress recently, and at this point would not like to discuss about this.   Patient claims that she has some dry cough. She chills denies any headache. She denies any abdominal pain.Denies any fever. denies any nausea vomiting or diarrhea.   Upon presentation to the ED, it was noted that her blood pressure  was 200/117. I was subsequently asked to admit this patient for further evaluation and treatment.   Consultants:  Cardiology  Pulmonology  Procedures:  CT angio chest  Antibiotics:  None  HPI/Subjective: Patient states she feels better but she continues to have severe cough.  She states the  swelling in her legs is improved after diuresis yesterday.  She did not wear her CPAP machine last night.     Objective: Filed Vitals:   01/18/12 0836 01/18/12 0849 01/18/12 0914 01/18/12 1103  BP: 146/80   133/85  Pulse: 80  84 81  Temp:      TempSrc:      Resp:   20   Height:      Weight:      SpO2:  96% 98%     Intake/Output Summary (Last 24 hours) at 01/18/12 1131 Last data filed at 01/18/12 1100  Gross per 24 hour  Intake    720 ml  Output   6300 ml  Net  -5580 ml   Filed Weights   01/16/12 1045 01/17/12 0430  Weight: 137.893 kg (304 lb) 136.986 kg (302 lb)    Exam:   General:  Obese CF, no acute distress  HEENT:  MMM  Cardiovascular:  Bradycardic, no murmurs, rubs or gallops  Respiratory:  CTAB, no cough during attempts at respirations today  Abdomen: NABS, soft, nondistended  EXt:  1+ LEE  Data Reviewed: Basic Metabolic Panel:  Lab 01/17/12 1610 01/16/12 1616 01/16/12 1043  NA 138 -- 138  K 3.4* -- 3.7  CL 95* -- 97  CO2 33* -- 29  GLUCOSE 216* -- 212*  BUN 12 -- 9  CREATININE 0.71 0.62 0.60  CALCIUM 9.3 -- 9.2  MG -- -- --  PHOS -- -- --   Liver Function Tests:  Lab 01/17/12 0237  AST 29  ALT 38*  ALKPHOS 111  BILITOT 0.3  PROT 7.5  ALBUMIN 3.7   No results found for this basename: LIPASE:5,AMYLASE:5 in the last 168 hours No results found for this basename: AMMONIA:5 in the last 168 hours CBC:  Lab 01/17/12 0237 01/16/12 1616 01/16/12 1043  WBC 12.9* 10.4 10.7*  NEUTROABS -- -- --  HGB 11.4* 11.1* 11.8*  HCT 37.1 35.4* 37.6  MCV 87.9 86.8 86.6  PLT 290 287 268   Cardiac Enzymes:  Lab 01/17/12 0236 01/16/12 2108 01/16/12 1615  CKTOTAL -- -- --  CKMB -- -- --  CKMBINDEX -- -- --  TROPONINI <0.30 <0.30 <0.30   BNP (last 3 results)  Basename 01/17/12 0237 01/16/12 1042  PROBNP 590.3* 503.2*   CBG:  Lab 01/18/12 0757 01/17/12 2021 01/17/12 1801 01/17/12 1625 01/17/12 1159  GLUCAP 143* 298* 278* 213* 199*    No  results found for this or any previous visit (from the past 240 hour(s)).   Studies: Ct Angio Chest Pe W/cm &/or Wo Cm  01/16/2012  *RADIOLOGY REPORT*  Clinical Data: Chest pain, shortness of breath, elevated D-dimer, evaluate for pulmonary embolism  CT ANGIOGRAPHY CHEST  Technique:  Multidetector CT imaging of the chest using the standard protocol during bolus administration of intravenous contrast. Multiplanar reconstructed images including MIPs were obtained and reviewed to evaluate the vascular anatomy.  Contrast: 80mL OMNIPAQUE IOHEXOL 350 MG/ML SOLN  Comparison: Chest CT - 10/12/2004  Vascular Findings:  There is suboptimal opacification of the pulmonary arterial system of the main pulmonary artery measuring 161 HU.  There are no discrete filling defects within the central pulmonary arterial  system to the level of the bilateral segmental pulmonary arteries. There is marked enlargement of the main caliber the main pulmonary artery, measuring approximately 4.2 cm in greatest transverse axial dimension, unchanged.  Cardiomegaly.  No pericardial effusion.  Normal caliber of the thoracic aorta.  Bovine configuration of the aortic arch.  No definite periaortic stranding.  Nonvascular findings:  Evaluation of the pulmonary parenchyma is degraded secondary to patient body habitus and respiratory artifact.  Linear heterogeneous opacities within the left lower lobe and geographic area of peripheral consolidation within the right lower lobe (image 36) are favored to represent atelectasis. Scattered areas of ground-glass lucency are nonspecific but may be seen in the setting of airways disease.  There is minimal collapse of the distal aspect of the trachea and bilateral main pulmonary bronchi, nonspecific but may be seen in setting of tracheomalacia. The central airways remain patent.  No pleural effusion or pneumothorax.  Shoddy mediastinal lymph nodes are not enlarged by CT criteria with index precarinal node  measuring 1 cm in Jolan Mealor axis diameter (image 27, series 4 and index prevascular lymph node measuring 6 mm in Alexiana Laverdure axis diameter (image 21).  No definite hilar or axillary lymphadenopathy.  Incidental imaging of the upper abdomen is normal.  There is an apparent interval enlargement of the left lobe of the thyroid, now with mass effect and rightward deviation of the tracheal air column.  This finding without a discrete thyroid nodule.  No acute or aggressive osseous abnormalities.  IMPRESSION:  1.  Degraded examination secondary to patient body habitus, respiratory artifact and suboptimal opacification of the pulmonary arterial system.  2.  No evidence of central pulmonary embolism.  If clinical concern persists for pulmonary embolism, further evaluation with VQ scan or bilateral lower extremity venous Doppler ultrasound may be performed as clinically indicated. 3.  Cardiomegaly and enlargement of the pulmonary arteries suggestive of pulmonary arterial hypertension.  Further evaluation with cardiac echo may be performed as clinically indicated. 4.  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  Further evaluation with PFTs and/or referral to Pulmonology may be performed as clinically indicated.  5.  Apparent enlargement of the left lobe of the thyroid with mass effect upon the trachea.  Further evaluation with dedicated thyroid ultrasound in the nonemergent setting is recommended.   Original Report Authenticated By: Waynard Reeds, M.D.     Scheduled Meds:    . albuterol  2.5 mg Nebulization Q6H  . bisoprolol  5 mg Oral Daily  . buPROPion  150 mg Oral Daily  . dextromethorphan-guaiFENesin  1 tablet Oral BID  . enoxaparin (LOVENOX) injection  40 mg Subcutaneous Q24H  . ferrous sulfate  325 mg Oral BID WC  . furosemide  80 mg Intravenous BID  . insulin aspart  0-15 Units Subcutaneous TID WC  . insulin aspart  4 Units Subcutaneous TID WC  . insulin glargine  80 Units  Subcutaneous QHS  . irbesartan  150 mg Oral Once  . irbesartan  300 mg Oral Daily  . loratadine  10 mg Oral Daily  . multivitamin with minerals  1 tablet Oral QHS  . pantoprazole  40 mg Oral BID AC  . potassium chloride  40 mEq Oral Once  . predniSONE  40 mg Oral Q breakfast  . sertraline  100 mg Oral Daily  . simvastatin  20 mg Oral QPM  . sodium chloride  3 mL Intravenous Q12H  . sodium chloride  3 mL Intravenous Q12H  .  spironolactone  25 mg Oral Daily  . DISCONTD: carvedilol  6.25 mg Oral BID WC  . DISCONTD: furosemide  40 mg Intravenous BID  . DISCONTD: insulin glargine  60 Units Subcutaneous QHS  . DISCONTD: ipratropium  0.5 mg Nebulization Q6H  . DISCONTD: irbesartan  150 mg Oral Daily  . DISCONTD: omega-3 acid ethyl esters  1 g Oral BID  . DISCONTD: pantoprazole  40 mg Oral Daily  . DISCONTD: predniSONE  50 mg Oral Q breakfast   Continuous Infusions:   Principal Problem:  *Hypertensive urgency Active Problems:  DIABETES MELLITUS, TYPE II  HYPERLIPIDEMIA  DEPRESSION  GERD  OBESITY, MORBID  CHF (congestive heart failure)  Chest pain  Anxiety  Acute diastolic heart failure  OSA (obstructive sleep apnea)    Time spent: 30    Yassmine Tamm, [facility]  [facility] Pager [phone]. If 8PM-8AM, please contact night-coverage at [email or web address], password [email or web address] 01/18/2012, 11:31 AM  LOS: 2 days

## 2012-01-18 NOTE — Progress Notes (Signed)
Pt resting in bed and coughed and suddenly developed a nose bleed. Pt denied any pain or dizziness. Nose bleed lasted approximately 15 minutes. Pt saturated 3 tissues and 2 wash cloths. Ice pack was applied to the nose. NP on call made aware. New order received. Will continue to monitor pt.

## 2012-01-19 DIAGNOSIS — J45909 Unspecified asthma, uncomplicated: Secondary | ICD-10-CM

## 2012-01-19 LAB — BASIC METABOLIC PANEL
Chloride: 94 mEq/L — ABNORMAL LOW (ref 96–112)
GFR calc Af Amer: >90 mL/min (ref >90)
GFR calc non Af Amer: >90 mL/min (ref >90)
Potassium: 3.5 mEq/L (ref 3.5–5.1)
Sodium: 139 mEq/L (ref 135–145)

## 2012-01-19 LAB — GLUCOSE, CAPILLARY
Glucose-Capillary: 115 mg/dL — ABNORMAL HIGH (ref 70–99)
Glucose-Capillary: 158 mg/dL — ABNORMAL HIGH (ref 70–99)
Glucose-Capillary: 270 mg/dL — ABNORMAL HIGH (ref 70–99)
Glucose-Capillary: 212 mg/dL — ABNORMAL HIGH (ref 70–99)

## 2012-01-19 MED ORDER — DM-GUAIFENESIN ER 30-600 MG PO TB12
2.0000 | ORAL_TABLET | Freq: Two times a day (BID) | ORAL | Status: DC
  Administered 2012-01-19 – 2012-01-22 (×6): 2 via ORAL
  Filled 2012-01-19 (×7): qty 2

## 2012-01-19 MED ORDER — FUROSEMIDE 10 MG/ML IJ SOLN
80.0000 mg | INTRAMUSCULAR | Status: DC
  Administered 2012-01-19 – 2012-01-21 (×3): 80 mg via INTRAVENOUS
  Filled 2012-01-19 (×4): qty 8

## 2012-01-19 MED ORDER — FUROSEMIDE 10 MG/ML IJ SOLN: 20.0000 mg | Freq: Every day | INTRAMUSCULAR | Status: DC

## 2012-01-19 MED ORDER — INSULIN ASPART 100 UNIT/ML ~~LOC~~ SOLN: 5.0000 [IU] | Freq: Three times a day (TID) | SUBCUTANEOUS | Status: DC

## 2012-01-19 MED ORDER — FUROSEMIDE 10 MG/ML IJ SOLN: 80.0000 mg | Freq: Every day | INTRAMUSCULAR | Status: DC

## 2012-01-19 MED ORDER — SALINE SPRAY 0.65 % NA SOLN
1.0000 | NASAL | Status: DC | PRN
  Filled 2012-01-19: qty 44

## 2012-01-19 MED ORDER — FUROSEMIDE 10 MG/ML IJ SOLN
80.0000 mg | Freq: Once | INTRAMUSCULAR | Status: AC
  Administered 2012-01-19: 80 mg via INTRAVENOUS
  Filled 2012-01-19: qty 8

## 2012-01-19 NOTE — Progress Notes (Signed)
Patient Name: [name]      SUBJECTIVE: less sob stil with cough   Past Medical History  Diagnosis Date  . Anemia     iron deficiency  . Asthma     a. 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  . Depression     a. Stress reaction 08/2011 in multiple social stressors.  . Diabetes mellitus   . Hypertension   . Morbid obesity   . Hyperlipidemia   . Vertigo   . PCOS (polycystic ovarian syndrome)   . Gastritis   . Insomnia   . Complication of anesthesia     DIFFICULT WAKING  . GERD (gastroesophageal reflux disease)   . OSA (obstructive sleep apnea) 01/17/2012    PHYSICAL EXAM Filed Vitals:   01/18/12 2200 01/19/12 0158 01/19/12 0530 01/19/12 0820  BP:   143/87   Pulse:   87   Temp:      TempSrc:      Resp: 20  14   Height:      Weight:   294 lb (133.358 kg)   SpO2:  99% 100% 88%   wrt down 8 lbs   Well developed and morbbidly obese mod resp distress HENT normal Neck supple with JVP-10 Clear Regular rate and rhythm, 2/6 systolic m Abd-soft with active BS No Clubbing cyanosis 2+ edema Skin-warm and dry A & Oriented  Grossly normal sensory and motor function  TELEMETRY: Reviewed telemetry pt in nsr   Intake/Output Summary (Last 24 hours) at 01/19/12 0943 Last data filed at 01/19/12 0500  Gross per 24 hour  Intake      0 ml  Output   4000 ml  Net  -4000 ml   NOT ACCURATE  LABS: Basic Metabolic Panel:  Lab 01/19/12 1610 01/18/12 1112 01/17/12 0237 01/16/12 1616 01/16/12 1043  NA 139 137 138 -- 138  K 3.5 3.3* 3.4* -- 3.7  CL 94* 93* 95* -- 97  CO2 37* 37* 33* -- 29  GLUCOSE 122* 162* 216* -- 212*  BUN 16 12 12  -- 9  CREATININE 0.76 0.66 0.71 0.62 0.60  CALCIUM 9.6 9.4 -- -- --  MG -- -- -- -- --  PHOS -- -- -- -- --   Cardiac Enzymes:  Basename 01/17/12 0236 01/16/12 2108 01/16/12 1615  CKTOTAL -- -- --  CKMB -- -- --  CKMBINDEX -- -- --  TROPONINI <0.30 <0.30 <0.30   CBC:  Lab 01/17/12 0237  01/16/12 1616 01/16/12 1043  WBC 12.9* 10.4 10.7*  NEUTROABS -- -- --  HGB 11.4* 11.1* 11.8*  HCT 37.1 35.4* 37.6  MCV 87.9 86.8 86.6  PLT 290 287 268   PROTIME: No results found for this basename: LABPROT:3,INR:3 in the last 72 hours Liver Function Tests:  [facility] 01/17/12 0237  AST 29  ALT 38*  ALKPHOS 111  BILITOT 0.3  PROT 7.5  ALBUMIN 3.7   No results found for this basename: LIPASE:2,AMYLASE:2 in the last 72 hours BNP: BNP (last 3 results)  Basename 01/17/12 0237 01/16/12 1042  PROBNP 590.3* 503.2*   D-Dimer:  Basename 01/16/12 1616  DDIMER 0.59*   Hemoglobin A1C:  Basename 01/17/12 0237  HGBA1C 9.0*   Fasting Lipid Panel:  Basename 01/17/12 0237  CHOL 138  HDL 38*  LDLCALC 78  TRIG 960  CHOLHDL 3.6  LDLDIRECT --   ECHO *>> nl LV functionwith LVH-moderate PA pressure 68  lDL at goal   ASSESSMENT AND  PLAN:  Patient Active Hospital Problem List: Hypertensive urgency (01/16/2012)   OBESITY, MORBID (04/27/2010)   Chest pain (01/16/2012)   Anxiety (01/16/2012)  * Acute diastolic heart failure (01/17/2012)   OSA (obstructive sleep apnea) (01/17/2012)  Alkalosis and hypokalemia      Need to check BMET this am  Dietary consult yesterday. Much appreciated Anticipate d/c TUESDAY  BP improved. We'll continue current medications. Continue IV diuresis until her BUN/creatinine begin to increase and well transition to by mouth. OSA>>CPAP to be titrated      Signed, Sherryl Manges MD  01/19/2012

## 2012-01-19 NOTE — Progress Notes (Signed)
Placed patient on Auto-CPAP with minimum pressure of 4cm and maximum pressure at 20cm. Oxygen set at 2lpm will continue to monitor

## 2012-01-19 NOTE — Progress Notes (Addendum)
Name: [name] MRN: [id number] DOB: 29-Jun-1965    LOS: 3   History of Present Illness:  Pulm [name] Reason for consult - dyspnea, tracheomalacia 46 y.o obese ex smoker with DM, HTN, HL, OSA not on CPAP, was admitted 10/24 with essentially 2 week h/o nasal congestion,  progressive dyspnea, wt gain, lower extremity edema, atypical CP and dyspnea w/ marked HTN. pBNP 503. POC troponin neg x1. ?glu 212, ?WBC 10.7, ?Hgb 11.8. CXR showed cardiomegaly and pulm vasc congestion but otherwise no acute finding.  Was admitted to medical service w/ cardiology consultation. Treatment has included: diuresis, CPAP while in hospital  and supplemental oxygen. PCCM asked to assess for dyspnea.  CT angio neg for PE but large left goiter wrapping around trachea with some mass effect. Review of PFTs 12/07/10 does not suggest any truncation  She notes childhood hx of asthma, but [name] last note reports more likely upper airway disease . She has coronary artery disease on cath in 2006 for CP with only catheter-induced spasm of RCA. EF was 65% at that time. She reports med compliance, but does note that her DM is uncontrolled and that BP runs very high.  Tests / Events: 10/25: Poorly visualized. Wall thickness wasincreased in a pattern of moderate LVH. Systolic function was normal. The estimated ejection fraction was in the rangeof 50% to 55%. Images were inadequate for LV wall motion assessment. Features are consistent with a pseudonormal left ventricular filling pattern, with concomitant abnormal relaxation and increased filling pressure (grade 2 diastolic dysfunction).  CT 10/24:  No evidence of central pulmonary embolism. Cardiomegaly and enlargement of the pulmonary arteries  suggestive of pulmonary arterial hypertension. Nonspecific findings suggestive of possible tracheomalacia  Apparent enlargement of the left lobe of the thyroid with mass effect upon the trachea.     Vital Signs: BP 143/87   Pulse 87  Temp 98.3 F (36.8 C) (Oral)  Resp 14  Ht 5\' 5"  (1.651 m)  Wt 294 lb (133.358 kg)  BMI 48.92 kg/m2  SpO2 88%  FIO2 2lpm       Intake/Output Summary (Last 24 hours) at 01/19/12 1247 Last data filed at 01/19/12 1100  Gross per 24 hour  Intake      0 ml  Output   3800 ml  Net  -3800 ml    Physical Examination: General: obese white female, no acute distress.  Neuro:  Awake, oriented, no focal def   HEENT:  Neck is large, difficult to differentiate thyroid from excessive tissue + upper airway faint wheeze at end expiration - no stridor Neck:  Large  Cardiovascular:  rrr Lungs:  Decreased  Abdomen:  Obese  Musculoskeletal:  Intact  Skin:  + lower extremity edema       Labs and Imaging:   Lab 01/19/12 0605 01/18/12 1112 01/17/12 0237  NA 139 137 138  K 3.5 3.3* 3.4*  CL 94* 93* 95*  CO2 37* 37* 33*  BUN 16 12 12   CREATININE 0.76 0.66 0.71  GLUCOSE 122* 162* 216*    Lab 01/17/12 0237 01/16/12 1616 01/16/12 1043  HGB 11.4* 11.1* 11.8*  HCT 37.1 35.4* 37.6  WBC 12.9* 10.4 10.7*  PLT 290 287 268    Assessment and Plan: Dyspnea. Multifactorial: agree, think this is mostly a complication of her morbid obesity, resultant  OSA +/- OHS, secondary PAH, and diastolic heart dysfxn. She seems to be responding favorably to diuretics. She does have an enlarged thyroid which  appears to be having mass effect on left lobe which may be resulting in some airway obstruction but unclear on how much this contributes to her dyspnea. Doubt that the radiographic suggestion of tracheal malacia is a significant contributor to her symptoms. Her prev PFTs do not support significant airflow limitations insp or exp but they are a year old and need to be repeated at outpt f/u  Recommendations OSA -Auto CPAP 10-20 cm with nasal mask, humidity on discharge, observe her tonight on this  Diastolic heart failure -need to determine ambulatory oxygen needs-->need to avoid hypoxia  -agree w/  diuretics as you are doing Feel this will be a lot better if OSA is adequately treated  Acute bronchitis - symptomatically improved Prednisone can be rapidly tapered to off in 7-10 days, for fear of worsening her sugars. Also would add back zyrtec 10 mg qhs at discharge  Goiter -tfts nl--> do not think that any primary intervention will be required at this point, as this is not the primary issue, but may be something to consider in future. In the short term CPAP would help with potential additional obstruction that thyroid might be adding - needs new set pfts to sort out impact of goiter on upper airway but this can be done as outpt.  Symptoms are markedly disproportionate to objective findings and not clear this is a lung problem but pt does appear to have difficult airway management issues. DDX of  difficult airways managment all start with A and  include Adherence, Ace Inhibitors, Acid Reflux, Active Sinus Disease, Alpha 1 Antitripsin deficiency, Anxiety masquerading as Airways dz,  ABPA,  allergy(esp in young), Aspiration (esp in elderly), Adverse effects of DPI,  Active smokers, plus two Bs  = Bronchiectasis and Beta blocker use..and one C= CHF  ? Acid reflux > max rx and no fish oil  ? Beta blocker effect > Strongly prefer in this setting: Bystolic, the most beta -1  selective Beta blocker available in sample form, with bisoprolol the most selective generic choice  on the market.   Sandrea Hughs, MD Pulmonary and Critical Care Medicine [facility] Cell [phone]

## 2012-01-19 NOTE — Progress Notes (Signed)
[facility] PROGRESS NOTE  [name] ZOX:[id number] DOB: 09-06-1965 DOA: 01/16/2012 PCP: Monica Manns, MD  Assessment/Plan:  Hypertensive urgency/malignant hypertension.  Blood pressure trending down.   -  Continue irbesartan 300mg  daily  -  Beta blocker started by pulmonology today  Dyspnea: likely due to OSA with secondary pulmonary arterial hypertension and diastolic dysfunction.  CTa was negative for PE.  ECHO demonstrated normal EF with grade 2 diastolic dysfunction and moderately dilated RV with peak PA pressure of .  Differential includes asthma (although per pulmonology patient had minimal obstruction on PFTs), tracheomalacia (also less likely per pulmonology), goiter compressing airway (possible), or allergy/reflux. -  Continue diuresis with lasix 80mg  IV BID (diuresed 4L yesterday) -  Daily electrolytes during diuresis with strict I/O -  Appreciate excellent pulmonology differential regarding cough and SOB -  PPI increased for possible GERD -  [name] course of prednisone 50mg  daily, day 3/5 -  Duonebs q6h -  Albuterol as needed. -  CPAP tonight with titration then home with home CPAP -  Per pulmonology, will need repeat outpatient PFTs  Epistaxis, likely due to dry air.  Patient not on blood thinner -  Continue cold compresses and afrin as needed -  Humidify air -  Nasal saline  Left ear pain:  May be related to epistaxis.  External ear canal and TM are normal.  Will likely resolve as blood clot resolves from left nare.   -  Monitor  Thyromegaly with deviation of trachea on CT -  TSH 1.5 wnl and fT4 1.35  .DIABETES MELLITUS, TYPE II, uncontrolled.  CBGs improving with increased lantus.  Monitor for hyperglycemia on steroids. -  Continue Lantus 80units qhs -  Increase aspart from 4 to 5 units AC -  Continue SSI    .HYPERLIPIDEMIA, stable. continue with Statins   .OBESITY, MORBID  -Counseled regarding the importance of weight loss.   Marland KitchenGERD Increased  PPI   .DEPRESSION/anxiety, stable.  Continue with regular meds and xanax prn.  Hypokalemia: LIkely due to lasix.  Resolved.   -  Repeat electrolytes in AM due to ongoing aggressive diuresis  Rising BUN:Cr and bicarb.   -  Monitor volume status and lasix carefully  DIET:  Carbohydrate controlled ACCESS:  PIV IVF: None PROPH:  lovenox  Code Status: Full Family Communication: spoke with patient and family regarding plan of care Disposition Plan: Pending stable blood pressure, improvement in dyspnea, and fingersticks controlled.  HPI:  Patient is a 46 year old Caucasian female with a past medical history of diabetes, hypertension, dyslipidemia, prior smoker (quit 2 years ago), morbid obesity, history of obstructive apnea noncompliant to CPAP (secondary to financial issues) who presented to the ED for evaluation of the above noted complaints. The patient for the past 2 weeks she has been having exertional dyspnea. She claims that even walking around the house makes [name] of breath. She claims that she does not get [name] of breath at rest, and the shortness of breath is exclusively on exertion. She claims that she is now unable to lie flat in is not sleeping on a couch. She claims that this has now been associated with worsening bilateral lower extremity edema. She claims she has gained approximately 15 pounds over the past 2 weeks. She also claims she's been having some chest pain for the past 2 weeks as well. Patient claims that the pain is mostly sharp and located in the lower sternal area, she claims that the pain is easily reproducible on deep  palpation. There is no radiation of the pain, it is not associated with nausea vomiting. Patient claims that she has been under a lot of stress recently, and at this point would not like to discuss about this.   Patient claims that she has some dry cough. She chills denies any headache. She denies any abdominal pain.Denies any fever. denies any nausea  vomiting or diarrhea.   Upon presentation to the ED, it was noted that her blood pressure was 200/117. I was subsequently asked to admit this patient for further evaluation and treatment.   Consultants:  Cardiology  Pulmonology  Procedures:  CT angio chest  Antibiotics:  None  HPI/Subjective: Patient states she feels better but she continues to have severe cough.  She states the swelling in her legs is improved after diuresis yesterday.  She wore CPAP last night.  She had an episode of hypoxia this morning and again this afternoon.  Epistaxis last night resolved with pressure after 10 to 15 minutes.  She had another [name] episode this morning that resolved with pressure and afrin.       Objective: Filed Vitals:   01/19/12 1339 01/19/12 1340 01/19/12 1415 01/19/12 1500  BP: 131/77     Pulse: 67     Temp: 98.1 F (36.7 C)     TempSrc:      Resp: 17     Height:      Weight:      SpO2: 89% 97% 97% 96%    Intake/Output Summary (Last 24 hours) at 01/19/12 1823 Last data filed at 01/19/12 1600  Gross per 24 hour  Intake    320 ml  Output   5650 ml  Net  -5330 ml   Filed Weights   01/16/12 1045 01/17/12 0430 01/19/12 0530  Weight: 137.893 kg (304 lb) 136.986 kg (302 lb) 133.358 kg (294 lb)    Exam:   General:  Obese CF, no acute distress  HEENT:  MMM.  Blood apparent in left nare.  Left external ear canal without erythema or discharge.  Left TM with normal cone of light, no purulence or fluid level.    Cardiovascular:  Bradycardic, no murmurs, rubs or gallops  Respiratory:  CTAB, no cough during attempts at respirations today  Abdomen: NABS, soft, nondistended  EXt:  1+ LEE  Data Reviewed: Basic Metabolic Panel:  Lab 01/19/12 4098 01/18/12 1112 01/17/12 0237 01/16/12 1616 01/16/12 1043  NA 139 137 138 -- 138  K 3.5 3.3* 3.4* -- 3.7  CL 94* 93* 95* -- 97  CO2 37* 37* 33* -- 29  GLUCOSE 122* 162* 216* -- 212*  BUN 16 12 12  -- 9  CREATININE 0.76 0.66  0.71 0.62 0.60  CALCIUM 9.6 9.4 9.3 -- 9.2  MG -- -- -- -- --  PHOS -- -- -- -- --   Liver Function Tests:  Lab 01/17/12 0237  AST 29  ALT 38*  ALKPHOS 111  BILITOT 0.3  PROT 7.5  ALBUMIN 3.7   No results found for this basename: LIPASE:5,AMYLASE:5 in the last 168 hours No results found for this basename: AMMONIA:5 in the last 168 hours CBC:  Lab 01/17/12 0237 01/16/12 1616 01/16/12 1043  WBC 12.9* 10.4 10.7*  NEUTROABS -- -- --  HGB 11.4* 11.1* 11.8*  HCT 37.1 35.4* 37.6  MCV 87.9 86.8 86.6  PLT 290 287 268   Cardiac Enzymes:  Lab 01/17/12 0236 01/16/12 2108 01/16/12 1615  CKTOTAL -- -- --  CKMB -- -- --  CKMBINDEX -- -- --  TROPONINI <0.30 <0.30 <0.30   BNP (last 3 results)  Basename 01/17/12 0237 01/16/12 1042  PROBNP 590.3* 503.2*   CBG:  Lab 01/19/12 1701 01/19/12 1203 01/19/12 0746 01/18/12 2044 01/18/12 1651  GLUCAP 270* 158* 115* 212* 274*    No results found for this or any previous visit (from the past 240 hour(s)).   Studies: No results found.  Scheduled Meds:    . albuterol  2.5 mg Nebulization Q6H  . bisoprolol  5 mg Oral Daily  . buPROPion  150 mg Oral Daily  . dextromethorphan-guaiFENesin  2 tablet Oral BID  . enoxaparin (LOVENOX) injection  40 mg Subcutaneous Q24H  . ferrous sulfate  325 mg Oral BID WC  . furosemide  80 mg Intravenous Once  . furosemide  80 mg Intravenous Once  . furosemide  80 mg Intravenous Q24H  . insulin aspart  0-15 Units Subcutaneous TID WC  . insulin aspart  4 Units Subcutaneous TID WC  . insulin glargine  80 Units Subcutaneous QHS  . irbesartan  300 mg Oral Daily  . loratadine  10 mg Oral Daily  . multivitamin with minerals  1 tablet Oral QHS  . oxymetazoline  2 spray Each Nare NOW  . pantoprazole  40 mg Oral BID AC  . predniSONE  40 mg Oral Q breakfast  . sertraline  100 mg Oral Daily  . simvastatin  20 mg Oral QPM  . sodium chloride  3 mL Intravenous Q12H  . sodium chloride  3 mL Intravenous Q12H  .  spironolactone  25 mg Oral Daily  . DISCONTD: dextromethorphan-guaiFENesin  1 tablet Oral BID  . DISCONTD: furosemide  20 mg Intravenous Daily  . DISCONTD: furosemide  80 mg Intravenous Daily  . DISCONTD: furosemide  40 mg Oral Daily   Continuous Infusions:   Principal Problem:  *Hypertensive urgency Active Problems:  DIABETES MELLITUS, TYPE II  HYPERLIPIDEMIA  DEPRESSION  GERD  OBESITY, MORBID  CHF (congestive heart failure)  Chest pain  Anxiety  Acute diastolic heart failure  OSA (obstructive sleep apnea)    Time spent: 30    Youcef Klas, [facility]  [facility] Pager [phone]. If 8PM-8AM, please contact night-coverage at [email or web address], password [facility] 01/19/2012, 6:23 PM  LOS: 3 days

## 2012-01-20 DIAGNOSIS — E876 Hypokalemia: Secondary | ICD-10-CM

## 2012-01-20 DIAGNOSIS — J209 Acute bronchitis, unspecified: Secondary | ICD-10-CM

## 2012-01-20 DIAGNOSIS — J309 Allergic rhinitis, unspecified: Secondary | ICD-10-CM

## 2012-01-20 DIAGNOSIS — F411 Generalized anxiety disorder: Secondary | ICD-10-CM

## 2012-01-20 LAB — GLUCOSE, CAPILLARY
Glucose-Capillary: 64 mg/dL — ABNORMAL LOW (ref 70–99)
Glucose-Capillary: 63 mg/dL — ABNORMAL LOW (ref 70–99)
Glucose-Capillary: 184 mg/dL — ABNORMAL HIGH (ref 70–99)

## 2012-01-20 LAB — BASIC METABOLIC PANEL
Calcium: 9.9 mg/dL (ref 8.4–10.5)
Creatinine, Ser: 0.8 mg/dL (ref 0.50–1.10)
GFR calc non Af Amer: 87 mL/min — ABNORMAL LOW (ref >90)
Sodium: 137 mEq/L (ref 135–145)

## 2012-01-20 MED ORDER — INSULIN ASPART 100 UNIT/ML ~~LOC~~ SOLN
4.0000 [IU] | Freq: Three times a day (TID) | SUBCUTANEOUS | Status: DC
  Administered 2012-01-20 – 2012-01-21 (×4): 4 [IU] via SUBCUTANEOUS

## 2012-01-20 MED ORDER — ENOXAPARIN SODIUM 60 MG/0.6ML ~~LOC~~ SOLN
60.0000 mg | SUBCUTANEOUS | Status: DC
  Administered 2012-01-20 – 2012-01-21 (×2): 60 mg via SUBCUTANEOUS
  Filled 2012-01-20 (×3): qty 0.6

## 2012-01-20 MED ORDER — DEXTROSE 50 % IV SOLN: 50.0000 mL | Freq: Once | INTRAVENOUS | Status: AC | PRN

## 2012-01-20 MED ORDER — POTASSIUM CHLORIDE CRYS ER 20 MEQ PO TBCR
40.0000 meq | EXTENDED_RELEASE_TABLET | Freq: Two times a day (BID) | ORAL | Status: DC
  Administered 2012-01-20 – 2012-01-22 (×5): 40 meq via ORAL
  Filled 2012-01-20 (×5): qty 2

## 2012-01-20 MED ORDER — POTASSIUM CHLORIDE 10 MEQ/100ML IV SOLN
10.0000 meq | INTRAVENOUS | Status: AC
  Administered 2012-01-20: 10 meq via INTRAVENOUS
  Filled 2012-01-20 (×2): qty 100

## 2012-01-20 MED ORDER — DEXTROSE 50 % IV SOLN: 25.0000 mL | Freq: Once | INTRAVENOUS | Status: AC | PRN

## 2012-01-20 MED ORDER — FLUTICASONE PROPIONATE 50 MCG/ACT NA SUSP
2.0000 | Freq: Every day | NASAL | Status: DC
  Administered 2012-01-20 – 2012-01-22 (×3): 2 via NASAL
  Filled 2012-01-20: qty 16

## 2012-01-20 MED ORDER — POTASSIUM CHLORIDE CRYS ER 20 MEQ PO TBCR
40.0000 meq | EXTENDED_RELEASE_TABLET | Freq: Once | ORAL | Status: AC
  Administered 2012-01-20: 40 meq via ORAL
  Filled 2012-01-20 (×2): qty 2

## 2012-01-20 MED ORDER — INSULIN GLARGINE 100 UNIT/ML ~~LOC~~ SOLN
60.0000 [IU] | Freq: Every day | SUBCUTANEOUS | Status: DC
  Administered 2012-01-20 – 2012-01-21 (×2): 60 [IU] via SUBCUTANEOUS

## 2012-01-20 MED ORDER — DOXYCYCLINE HYCLATE 100 MG PO TABS
100.0000 mg | ORAL_TABLET | Freq: Two times a day (BID) | ORAL | Status: DC
  Administered 2012-01-20 – 2012-01-22 (×5): 100 mg via ORAL
  Filled 2012-01-20 (×6): qty 1

## 2012-01-20 NOTE — Progress Notes (Signed)
Name: [name] MRN: [id number] DOB: 29-Mar-1965    LOS: 4 Date of admit: 01/16/2012    History of Present Illness:  Pulm - [facility] Reason for consult - dyspnea, tracheomalacia 46 y.o obese ex smoker with DM, HTN, HL, OSA not on CPAP, was admitted 10/24 with essentially 2 week h/o nasal congestion,  progressive dyspnea, wt gain, lower extremity edema, atypical CP and dyspnea w/ marked HTN. pBNP 503. POC troponin neg x1. ?glu 212, ?WBC 10.7, ?Hgb 11.8. CXR showed cardiomegaly and pulm vasc congestion but otherwise no acute finding.  Was admitted to medical service w/ cardiology consultation. Treatment has included: diuresis, CPAP while in hospital  and supplemental oxygen. PCCM asked to assess for dyspnea.  CT angio neg for PE but large left goiter wrapping around trachea with some mass effect. Review of PFTs 12/07/10 does not suggest any truncation  She notes childhood hx of asthma, but [name] last note reports more likely upper airway disease . She has coronary artery disease on cath in 2006 for CP with only catheter-induced spasm of RCA. EF was 65% at that time. She reports med compliance, but does note that her DM is uncontrolled and that BP runs very high.  Tests / Events: 10/25: Poorly visualized. Wall thickness wasincreased in a pattern of moderate LVH. Systolic function was normal. The estimated ejection fraction was in the rangeof 50% to 55%. Images were inadequate for LV wall motion assessment. Features are consistent with a pseudonormal left ventricular filling pattern, with concomitant abnormal relaxation and increased filling pressure (grade 2 diastolic dysfunction).  CT 10/24:  No evidence of central pulmonary embolism. Cardiomegaly and enlargement of the pulmonary arteries  suggestive of pulmonary arterial hypertension. Nonspecific findings suggestive of possible tracheomalacia  Apparent enlargement of the left lobe of the thyroid with mass effect upon the trachea.      SUBJECTIVE/OVERNIGHT/INTERVAL HX  - c/o lot of cough with ? Yellow/green sputum. Cough preventing cpap usage. Overall feeling better since admit  Vital Signs: BP 144/80  Pulse 74  Temp 98.4 F (36.9 C) (Oral)  Resp 19  Ht 5\' 5"  (1.651 m)  Wt 133.358 kg (294 lb)  BMI 48.92 kg/m2  SpO2 96%  FIO2 2lpm       Intake/Output Summary (Last 24 hours) at 01/20/12 1135 Last data filed at 01/20/12 4098  Gross per 24 hour  Intake    800 ml  Output   3801 ml  Net  -3001 ml    Physical Examination: General: obese white female, no acute distress.  Neuro:  Awake, oriented, no focal def   HEENT:  Neck is large, difficult to differentiate thyroid from excessive tissue + upper airway faint wheeze at end expiration - no stridor Neck:  Large  Cardiovascular:  rrr Lungs:  Decreased  Abdomen:  Obese  Musculoskeletal:  Intact  Skin:  + lower extremity edema       Labs and Imaging:   Lab 01/20/12 0635 01/19/12 0605 01/18/12 1112  NA 137 139 137  K 2.9* 3.5 3.3*  CL 91* 94* 93*  CO2 38* 37* 37*  BUN 19 16 12   CREATININE 0.80 0.76 0.66  GLUCOSE 70 122* 162*    Lab 01/17/12 0237 01/16/12 1616 01/16/12 1043  HGB 11.4* 11.1* 11.8*  HCT 37.1 35.4* 37.6  WBC 12.9* 10.4 10.7*  PLT 290 287 268    Assessment and Plan: Dyspnea. Multifactorial: agree, think this is mostly a complication of her morbid obesity, resultant  OSA +/- OHS, secondary PAH, and diastolic heart dysfxn. She seems to be responding favorably to diuretics. She does have an enlarged thyroid which appears to be having mass effect on left lobe which may be resulting in some airway obstruction but unclear on how much this contributes to her dyspnea. Doubt that the radiographic suggestion of tracheal malacia is a significant contributor to her symptoms. Her prev PFTs do not support significant airflow limitations insp or exp but they are a year old and need to be repeated at outpt f/u    - on 01/20/2012: lot of cough ?  Acute bronchhitis v post nasal drainage (admits to  It) v gerd (on bid ppi) vs some combo contributing to cough.  Recommendations   - Rx with fluitcasone  - STart doxyyccline  - ENT eval recommended for goiter - Control cough with above to helpw with cpap compliance:  - In additin:   - OSA -Auto CPAP 10-20 cm with nasal mask, humidity on discharge, observe her tonight on this  - Diastolic heart failure   Per cards  -need to determine ambulatory oxygen needs-->need to avoid hypoxia   -agree w/ diuretics as you are doing  - Prednisone can be rapidly tapered to off in 7-10 days,  - Also would add back zyrtec 10 mg qhs at discharge    Dr. Kalman Shan, M.D., [facility].C.P Pulmonary and Critical Care Medicine Staff Physician [facility] Pager: [phone], If no answer or between  15:00h - 7:00h: call [phone]  01/20/2012 11:40 AM

## 2012-01-20 NOTE — Progress Notes (Signed)
Hypoglycemic Event  CBG: 64  Treatment: 15 GM carbohydrate snack  Symptoms: None  Follow-up CBG: Time:0800 CBG Result:85  Possible Reasons for Event: Medication regimen: pt states she is getting more insulin here than she takes at home.    Ramond Craver Lauren  Remember to initiate Hypoglycemia Order Set & complete

## 2012-01-20 NOTE — Progress Notes (Signed)
Physical Therapy Treatment Patient Details Name: [name] MRN: [id number] DOB: 1966-01-30 Today's Date: 01/20/2012 Time: 4696-2952 PT Time Calculation (min): 16 min  PT Assessment / Plan / Recommendation Comments on Treatment Session  Pt admitted with CHF and continues to progress with PT. Able to increase activity tolerance today with increased ambulation distance. O2 sats on RA at 90% and above throughout session.    Follow Up Recommendations  No PT follow up;Supervision - Intermittent     Does the patient have the potential to tolerate intense rehabilitation     Barriers to Discharge        Equipment Recommendations  None recommended by PT    Recommendations for Other Services    Frequency Min 3X/week   Plan Discharge plan remains appropriate;Frequency remains appropriate    Precautions / Restrictions Precautions Precautions: None Restrictions Weight Bearing Restrictions: No   Pertinent Vitals/Pain None    Mobility  Bed Mobility Bed Mobility: Supine to Sit;Sit to Supine Supine to Sit: 6: Modified independent (Device/Increase time) Sit to Supine: 6: Modified independent (Device/Increase time) Transfers Transfers: Sit to Stand;Stand to Sit Sit to Stand: 6: Modified independent (Device/Increase time);With upper extremity assist;From bed Stand to Sit: 6: Modified independent (Device/Increase time);With upper extremity assist;To bed Ambulation/Gait Ambulation/Gait Assistance: 5: Supervision Ambulation Distance (Feet): 300 Feet Assistive device: None Ambulation/Gait Assistance Details: Verbal cues for tall posture and pursed lip breathing with monitoring to O2 sats throughout. Pt ambulated on RA with O2 94% before gait, 90% during gait, and 94% with rest after gait. Gait Pattern: Step-through pattern;Decreased stride length Gait velocity: Slow gait speed Stairs: No Wheelchair Mobility Wheelchair Mobility: No    Exercises     PT Diagnosis:    PT Problem  List:   PT Treatment Interventions:     PT Goals Acute Rehab PT Goals PT Goal Formulation: With patient Time For Goal Achievement: 01/25/12 Potential to Achieve Goals: Good PT Goal: Sit to Stand - Progress: Progressing toward goal PT Goal: Stand to Sit - Progress: Progressing toward goal PT Goal: Ambulate - Progress: Progressing toward goal  Visit Information  Last PT Received On: 01/20/12 Assistance Needed: +1    Subjective Data  Subjective: "I would love to walk." Patient Stated Goal: To go home   Cognition  Overall Cognitive Status: Appears within functional limits for tasks assessed/performed Arousal/Alertness: Awake/alert Orientation Level: Oriented X4 / Intact Behavior During Session: [facility] for tasks performed    Balance  Balance Balance Assessed: No  End of Session PT - End of Session Activity Tolerance: Patient tolerated treatment well Patient left: in bed;with call bell/phone within reach Nurse Communication: Mobility status   GP     Cephus Shelling 01/20/2012, 11:04 AM  01/20/2012 Cephus Shelling, PT, DPT [phone]

## 2012-01-20 NOTE — Progress Notes (Addendum)
[facility] PROGRESS NOTE  [name] AVW:[id number] DOB: [birth year] DOA: 01/16/2012 PCP: Roxy Manns, MD  Assessment/Plan:  Dyspnea: likely due to OSA with secondary pulmonary arterial hypertension and diastolic dysfunction.  CTa was negative for PE.  ECHO demonstrated normal EF with grade 2 diastolic dysfunction and moderately dilated RV with peak PA pressure of .  Differential includes asthma (although per pulmonology patient had minimal obstruction on PFTs), tracheomalacia (also less likely per pulmonology), goiter compressing airway (possible), or allergy/reflux. 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  Spoke with ENT who recommended CT neck and outpatient follow up. -  TSH 1.5 wnl and fT4 1.35 -  CT neck pending  Epistaxis, likely due to dry air.  Patient not on blood thinner -  Continue cold compresses and afrin as needed -  Humidify air -  Nasal saline  Hypertensive urgency/malignant hypertension.  Blood pressure markedly improved -  Continue irbesartan and beta blocker  Left ear pain, improved:  May be related to epistaxis.  External ear canal and TM are normal.    .DIABETES MELLITUS, TYPE II, uncontrolled.  CBGs low this morning.   -  Decrease Lantus to 60units qhs -  Decrease aspart from 5 to 4 units AC -  Continue SSI    .HYPERLIPIDEMIA, stable. continue with Statins   .OBESITY, MORBID  -Counseled regarding the importance of weight loss.   Marland KitchenGERD Increased PPI   .DEPRESSION/anxiety, stable.  Continue with regular meds and xanax prn.  Hypokalemia:  LIkely due to lasix.   -  Repeat with oral  potassium  Rising BUN:Cr and bicarb.   -  Monitor volume status and kidney function  DIET:  Carbohydrate controlled ACCESS:  PIV IVF: None PROPH:  lovenox  Code Status: Full Family Communication: spoke with patient and family regarding plan of care Disposition Plan: Pending stable blood pressure, improvement in dyspnea, and fingersticks controlled.  HPI:  Patient is a 46 year old Caucasian female with a past medical history of diabetes, hypertension, dyslipidemia, prior smoker (quit 2 years ago), morbid obesity, history of obstructive apnea noncompliant to CPAP (secondary to financial issues) who presented to the ED for evaluation of the above noted complaints. The patient for the past 2 weeks she has been having exertional dyspnea. She claims that even walking around the house makes [name] of breath. She claims that she does not get [name] of breath at rest, and the shortness of breath is exclusively on exertion. She claims that she is now unable to lie flat in is not sleeping on a couch. She claims that this has now been associated with worsening bilateral lower extremity edema. She claims she has gained approximately 15 pounds over the past 2 weeks. She also claims she's been having some chest pain for the past 2 weeks as well. Patient claims that the pain is mostly sharp and located in the lower sternal area, she claims that the pain is easily reproducible on deep palpation. There is no radiation of the pain, it is not associated with nausea vomiting. Patient claims that she has been under a lot of stress recently,  and at this point would not like to discuss about this.   Patient claims that she has some dry cough. She chills denies any headache. She denies any abdominal pain.Denies any fever. denies any nausea vomiting or diarrhea.   Upon presentation to the ED, it was noted that her blood pressure was 200/117. I was subsequently asked to admit this patient for further evaluation and  treatment.   Consultants:  Cardiology  Pulmonology  Procedures:  CT angio chest  Antibiotics:  None  HPI/Subjective: Patient states she feels better but she continues to have severe cough.  She states the swelling in her legs is improved after diuresis yesterday.  She wore CPAP a little last night but had to take it off several times because of cough.      Objective: Filed Vitals:   01/20/12 1337 01/20/12 1502 01/20/12 2052 01/20/12 2100  BP: 107/70  119/82   Pulse: 76  71   Temp: 97.4 F (36.3 C)  98.2 F (36.8 C)   TempSrc:   Oral   Resp: 18  18   Height:      Weight:      SpO2: 91% 93% 95% 96%    Intake/Output Summary (Last 24 hours) at 01/20/12 2126 Last data filed at 01/20/12 2052  Gross per 24 hour  Intake    720 ml  Output   3951 ml  Net  -3231 ml   Filed Weights   01/16/12 1045 01/17/12 0430 01/19/12 0530  Weight: 137.893 kg (304 lb) 136.986 kg (302 lb) 133.358 kg (294 lb)    Exam:   General:  Obese CF, no acute distress  HEENT:  MMM.      Cardiovascular:  Bradycardic, no murmurs, rubs or gallops  Respiratory:  CTAB, no cough during attempts at respirations today  Abdomen: NABS, soft, nondistended  EXt:  1+ slow pitting LEE  Data Reviewed: Basic Metabolic Panel:  Lab 01/20/12 1478 01/19/12 0605 01/18/12 1112 01/17/12 0237 01/16/12 1616 01/16/12 1043  NA 137 139 137 138 -- 138  K 2.9* 3.5 3.3* 3.4* -- 3.7  CL 91* 94* 93* 95* -- 97  CO2 38* 37* 37* 33* -- 29  GLUCOSE 70 122* 162* 216* -- 212*  BUN 19 16 12 12  -- 9  CREATININE 0.80 0.76 0.66 0.71 0.62 --  CALCIUM 9.9 9.6 9.4 9.3 -- 9.2  MG -- -- -- -- -- --  PHOS -- -- -- -- -- --   Liver Function Tests:  Lab 01/17/12 0237  AST 29  ALT 38*  ALKPHOS 111  BILITOT 0.3  PROT 7.5  ALBUMIN 3.7   No results found for this basename: LIPASE:5,AMYLASE:5 in the last 168 hours No results found for this basename: AMMONIA:5 in the last 168 hours CBC:  Lab 01/17/12 0237 01/16/12 1616  01/16/12 1043  WBC 12.9* 10.4 10.7*  NEUTROABS -- -- --  HGB 11.4* 11.1* 11.8*  HCT 37.1 35.4* 37.6  MCV 87.9 86.8 86.6  PLT 290 287 268   Cardiac Enzymes:  Lab 01/17/12 0236 01/16/12 2108 01/16/12 1615  CKTOTAL -- -- --  CKMB -- -- --  CKMBINDEX -- -- --  TROPONINI <0.30 <0.30 <0.30   BNP (last 3 results)  Basename 01/17/12 0237 01/16/12 1042  PROBNP 590.3* 503.2*   CBG:  Lab 01/20/12 1624 01/20/12 1141 01/20/12 0759 01/20/12 0756 01/20/12 0718  GLUCAP 266* 138* 85 63* 64*    No results found for this or any previous visit (from the  past 240 hour(s)).   Studies: No results found.  Scheduled Meds:    . albuterol  2.5 mg Nebulization Q6H  . bisoprolol  5 mg Oral Daily  . buPROPion  150 mg Oral Daily  . dextromethorphan-guaiFENesin  2 tablet Oral BID  . doxycycline  100 mg Oral Q12H  . enoxaparin (LOVENOX) injection  60 mg Subcutaneous Q24H  . ferrous sulfate  325 mg Oral BID WC  . fluticasone  2 spray Each Nare Daily  . furosemide  80 mg Intravenous Q24H  . insulin aspart  0-15 Units Subcutaneous TID WC  . insulin aspart  4 Units Subcutaneous TID WC  . insulin glargine  60 Units Subcutaneous QHS  . irbesartan  300 mg Oral Daily  . loratadine  10 mg Oral Daily  . multivitamin with minerals  1 tablet Oral QHS  . pantoprazole  40 mg Oral BID AC  . potassium chloride  10 mEq Intravenous Q1 Hr x 2  . potassium chloride  40 mEq Oral Once  . potassium chloride  40 mEq Oral BID  . predniSONE  40 mg Oral Q breakfast  . sertraline  100 mg Oral Daily  . simvastatin  20 mg Oral QPM  . sodium chloride  3 mL Intravenous Q12H  . spironolactone  25 mg Oral Daily  . DISCONTD: enoxaparin (LOVENOX) injection  40 mg Subcutaneous Q24H  . DISCONTD: insulin aspart  5 Units Subcutaneous TID WC  . DISCONTD: insulin glargine  80 Units Subcutaneous QHS   Continuous Infusions:   Principal Problem:  *Hypertensive urgency Active Problems:  DIABETES MELLITUS, TYPE II   HYPERLIPIDEMIA  DEPRESSION  GERD  OBESITY, MORBID  CHF (congestive heart failure)  Chest pain  Anxiety  Acute diastolic heart failure  OSA (obstructive sleep apnea)  Hypokalemia  Acute bronchitis    Time spent: 30    Ree Alcalde, [facility]  [facility] Pager [phone]. If 8PM-8AM, please contact night-coverage at [email or web address], password [facility] 01/20/2012, 9:26 PM  LOS: 4 days

## 2012-01-20 NOTE — Progress Notes (Signed)
Advanced Heart Failure Rounding Note   Subjective:    Ms. Dante is a 46 y/o F hx of DM, HTN, HL, morbid obesity, OSA noncompliant with CPAP due to finances, anxiety whose cardiac hx includes absence of coronary artery disease on cath in 2006 for CP with only catheter-induced spasm of RCA. EF was 65% at that time.  Echo 10/24: EF 50-55%. Grade 2 diastolic dysfunction.  RV mildly dilated.  PAPP 68 mmHg  She continues to diurese well on IV lasix. - 4L yesterday. Was able to walk hall twice.  Continues with severe cough productive of thick yellow sputum.  We weighted her personally and weight now 291 pounds (admit weight 304)  Objective:   Weight Range:  Vital Signs:   Temp:  [98 F (36.7 C)-98.4 F (36.9 C)] 98.4 F (36.9 C) (10/28 0600) Pulse Rate:  [67-77] 74  (10/28 0600) Resp:  [17-19] 19  (10/28 0600) BP: (131-144)/(71-80) 144/80 mmHg (10/28 0600) SpO2:  [89 %-98 %] 98 % (10/28 0600) Last BM Date: 01/15/12  Weight change: Filed Weights   01/16/12 1045 01/17/12 0430 01/19/12 0530  Weight: 137.893 kg (304 lb) 136.986 kg (302 lb) 133.358 kg (294 lb)    Intake/Output:   Intake/Output Summary (Last 24 hours) at 01/20/12 0826 Last data filed at 01/20/12 0805  Gross per 24 hour  Intake    800 ml  Output   4350 ml  Net  -3550 ml     Physical Exam: General: Hacking cough. No resp difficulty HEENT: normal Neck: supple. JVP hard to see appears 9-10 . Carotids 2+ bilat; no bruits. No lymphadenopathy or thryomegaly appreciated. Cor: PMI nondisplaced. Regular rate & rhythm. No rubs, gallops or murmurs. Lungs: clear Abdomen: obese  nontender, nondistended.  No bruits or masses. Good bowel sounds. Extremities: no cyanosis, clubbing, rash, 1+ ankle edema Neuro: alert & orientedx3, cranial nerves grossly intact. moves all 4 extremities w/o difficulty. Affect pleasant   Labs: Basic Metabolic Panel:  Lab 01/20/12 1610 01/19/12 0605 01/18/12 1112 01/17/12 0237 01/16/12 1616  01/16/12 1043  NA 137 139 137 138 -- 138  K 2.9* 3.5 3.3* 3.4* -- 3.7  CL 91* 94* 93* 95* -- 97  CO2 38* 37* 37* 33* -- 29  GLUCOSE 70 122* 162* 216* -- 212*  BUN 19 16 12 12  -- 9  CREATININE 0.80 0.76 0.66 0.71 0.62 --  CALCIUM 9.9 9.6 9.4 -- -- --  MG -- -- -- -- -- --  PHOS -- -- -- -- -- --    Liver Function Tests:  Lab 01/17/12 0237  AST 29  ALT 38*  ALKPHOS 111  BILITOT 0.3  PROT 7.5  ALBUMIN 3.7   No results found for this basename: LIPASE:5,AMYLASE:5 in the last 168 hours No results found for this basename: AMMONIA:3 in the last 168 hours  CBC:  Lab 01/17/12 0237 01/16/12 1616 01/16/12 1043  WBC 12.9* 10.4 10.7*  NEUTROABS -- -- --  HGB 11.4* 11.1* 11.8*  HCT 37.1 35.4* 37.6  MCV 87.9 86.8 86.6  PLT 290 287 268    Cardiac Enzymes:  Lab 01/17/12 0236 01/16/12 2108 01/16/12 1615  CKTOTAL -- -- --  CKMB -- -- --  CKMBINDEX -- -- --  TROPONINI <0.30 <0.30 <0.30    BNP: BNP (last 3 results)  Basename 01/17/12 0237 01/16/12 1042  PROBNP 590.3* 503.2*     Other results:  EKG:   Imaging:  No results found.   Medications:  Scheduled Medications:    . albuterol  2.5 mg Nebulization Q6H  . bisoprolol  5 mg Oral Daily  . buPROPion  150 mg Oral Daily  . dextromethorphan-guaiFENesin  2 tablet Oral BID  . enoxaparin (LOVENOX) injection  40 mg Subcutaneous Q24H  . ferrous sulfate  325 mg Oral BID WC  . furosemide  80 mg Intravenous Once  . furosemide  80 mg Intravenous Q24H  . insulin aspart  0-15 Units Subcutaneous TID WC  . insulin aspart  4 Units Subcutaneous TID WC  . insulin glargine  60 Units Subcutaneous QHS  . irbesartan  300 mg Oral Daily  . loratadine  10 mg Oral Daily  . multivitamin with minerals  1 tablet Oral QHS  . pantoprazole  40 mg Oral BID AC  . potassium chloride  10 mEq Intravenous Q1 Hr x 2  . potassium chloride  40 mEq Oral Once  . predniSONE  40 mg Oral Q breakfast  . sertraline  100 mg Oral Daily  .  simvastatin  20 mg Oral QPM  . sodium chloride  3 mL Intravenous Q12H  . spironolactone  25 mg Oral Daily  . DISCONTD: dextromethorphan-guaiFENesin  1 tablet Oral BID  . DISCONTD: furosemide  20 mg Intravenous Daily  . DISCONTD: furosemide  80 mg Intravenous Daily  . DISCONTD: furosemide  40 mg Oral Daily  . DISCONTD: insulin aspart  4 Units Subcutaneous TID WC  . DISCONTD: insulin aspart  5 Units Subcutaneous TID WC  . DISCONTD: insulin glargine  80 Units Subcutaneous QHS  . DISCONTD: sodium chloride  3 mL Intravenous Q12H     Infusions:     PRN Medications:  sodium chloride, acetaminophen, acetaminophen, albuterol, alum & mag hydroxide-simeth, dextrose, dextrose, gi cocktail, LORazepam, nitroGLYCERIN, ondansetron (ZOFRAN) IV, ondansetron, sodium chloride, sodium chloride, zolpidem   Assessment:   1. Acute diastolic HF and obesity hypoventialtion syndrome  2. Chest wall pain  3. Uncontrolled HTN  4. Diabetes mellitus  5. Morbid obesity BMI 50.6  6. Severe OSA by sleep study 2012 - noncompliant with CPAP due to finances. SW to get involved for possible assistance. Placing CPAP in hospital.  7. Rectal bleeding with hx of IDA, colon polyps, family hx of colon cancer  8. PCOS. 9. Hypokalemia 10. Acute bronchitis  Patient seen and examined with Ulyess Blossom, PA-C. We discussed all aspects of the encounter. I agree with the assessment as stated above. See my thoughts below.   Plan/Discussion:    Her volume status continues to improve. Would continue IV lasix as renal function is stable and she appears to have more volume on board.   She continues with thick productive cough. Would consider treating with antibiotics. Being followed by Hospitalist and Pulmonary teams. Will defer this to them.  Supp K+ aggressively.   Daniel Bensimhon,MD 9:10 AM

## 2012-01-21 ENCOUNTER — Inpatient Hospital Stay (HOSPITAL_COMMUNITY): Payer: BC Managed Care – PPO

## 2012-01-21 DIAGNOSIS — E041 Nontoxic single thyroid nodule: Secondary | ICD-10-CM

## 2012-01-21 LAB — CORTISOL: Cortisol, Plasma: 40 ug/dL

## 2012-01-21 LAB — BASIC METABOLIC PANEL
BUN: 18 mg/dL (ref 6–23)
CO2: 34 mEq/L — ABNORMAL HIGH (ref 19–32)
Chloride: 93 mEq/L — ABNORMAL LOW (ref 96–112)
GFR calc Af Amer: >90 mL/min (ref >90)
Potassium: 4.1 mEq/L (ref 3.5–5.1)

## 2012-01-21 LAB — GLUCOSE, CAPILLARY
Glucose-Capillary: 213 mg/dL — ABNORMAL HIGH (ref 70–99)
Glucose-Capillary: 347 mg/dL — ABNORMAL HIGH (ref 70–99)

## 2012-01-21 MED ORDER — PREDNISONE 20 MG PO TABS
20.0000 mg | ORAL_TABLET | Freq: Every day | ORAL | Status: DC
  Administered 2012-01-22: 20 mg via ORAL
  Filled 2012-01-21 (×2): qty 1

## 2012-01-21 MED ORDER — INSULIN ASPART 100 UNIT/ML ~~LOC~~ SOLN
5.0000 [IU] | Freq: Three times a day (TID) | SUBCUTANEOUS | Status: DC
  Administered 2012-01-22 (×3): 5 [IU] via SUBCUTANEOUS

## 2012-01-21 MED ORDER — METOLAZONE 5 MG PO TABS
5.0000 mg | ORAL_TABLET | Freq: Once | ORAL | Status: AC
  Administered 2012-01-21: 5 mg via ORAL
  Filled 2012-01-21: qty 1

## 2012-01-21 MED ORDER — IOHEXOL 300 MG/ML  SOLN
80.0000 mL | Freq: Once | INTRAMUSCULAR | Status: AC | PRN
  Administered 2012-01-21: 80 mL via INTRAVENOUS

## 2012-01-21 NOTE — Progress Notes (Signed)
   CARE MANAGEMENT NOTE 01/21/2012  Patient:  [name], [name]   Account Number:  [id number]  Date Initiated:  01/17/2012  Documentation initiated by:  GRAVES-BIGELOW,Zyan Mirkin  Subjective/Objective Assessment:   Pt admitted with htn urgency. Pt arrived here from work. Hx of cpap use. Pt is from home with husband.     Action/Plan:   CM did contact [facility] due to pt had sleep study in August of 2012. Sleep study sent to [facility] Derrian Harris.   Anticipated DC Date:  01/18/2012   Anticipated DC Plan:  HOME/SELF CARE      DC Planning Services  CM consult      PAC Choice  DURABLE MEDICAL EQUIPMENT   Choice offered to / List presented to:  C-1 Patient   DME arranged  CPAP      DME agency  [facility]        Status of service:  In process, will continue to follow Medicare Important Message given?   (If response is "NO", the following Medicare IM given date fields will be blank) Date Medicare IM given:   Date Additional Medicare IM given:    Discharge Disposition:    Per UR Regulation:  Reviewed for med. necessity/level of care/duration of stay  If discussed at Long Length of Stay Meetings, dates discussed:    Comments:  01-21-12 [address] [place], [place] [phone] CM did call Derrian with [facility]. Sleep study was sent to [facility]. Unsure of d/c date at this time. Diuresing at this time. Hopefully plan for d/c in next 24-48 hours. Will make [facility] aware. No need for 02 at this time.   01-17-12 [address] [place], RN,BSN [phone] [facility] will look at pt over the weekend and discuss payment option for dme. Weekend CM will assist with needs.

## 2012-01-21 NOTE — Progress Notes (Addendum)
[facility] PROGRESS NOTE  [name] ZOX:[id number] DOB: 09/22/1965 DOA: 01/16/2012 PCP: Roxy Manns, MD  Assessment/Plan:  Dyspnea:  Markedly improved.  Likely due to OSA with secondary pulmonary arterial hypertension and diastolic heart failure.  CTa was negative for PE.  ECHO demonstrated normal EF with grade 2 diastolic dysfunction and moderately dilated RV with peak PA pressure of .  Differential included asthma (although per pulmonology patient had minimal obstruction on PFTs), tracheomalacia (also less likely per pulmonology), goiter compressing airway (possible), or allergy/reflux. -  Continue diuresis with lasix 80mg  IV daily (has diuresed 18L since admission per flow sheet) -  Continue high dose PPI increased for possible GERD -  Flonase  -  Craven Crean course of prednisone day 5, reduce dose to 20mg  on 10/30, then 10mg  on 10/31, then off. -  Duonebs q6h and albuterol prn -  CPAP tonight  -  will need repeat outpatient PFTs -  Doxycycline started by pulmonology on 10/28  Goiter:  Thyromegaly with deviation of trachea on CT.  Spoke with ENT who recommended CT neck and outpatient follow up. -  TSH 1.5 wnl and fT4 1.35 -  CT neck demonstrates >1cm calcified nodule.  Will defer further work up to ENT as may not need biopsy if they plan to surgically remove goiter.    Epistaxis, likely due to dry air, resolved.  Patient not on blood thinner -  Continue cold compresses and afrin as needed -  Humidify air -  Nasal saline  Hypertensive urgency/malignant hypertension.  Blood pressure markedly improved -  Continue irbesartan and beta blocker  .DIABETES MELLITUS, TYPE II, uncontrolled.  CBGs stable to elevated today.   -  Continue Lantus to 60units qhs -  aspart 5 units AC -  Continue SSI    .HYPERLIPIDEMIA, stable. continue with Statins   .OBESITY, MORBID  -Counseled regarding the importance of weight loss.   Marland KitchenGERD Increased PPI   .DEPRESSION/anxiety, stable.   Continue with regular meds and xanax prn.  Hypokalemia:  LIkely due to lasix.   -  Repeat with oral potassium  Blood clots in urine:  Urine is clear.  Patient has 2-yo Mirena IUD in place and has GYN appointment in a few days.  Told her to keep appointment   Rising BUN:Cr and bicarb.   -  Monitor volume status and kidney function  DIET:  Carbohydrate controlled ACCESS:  PIV IVF: None PROPH:  lovenox  Code Status: Full Family Communication: spoke with patient and family regarding plan of care Disposition Plan: Pending stable blood pressure, improvement in dyspnea, and fingersticks controlled.  HPI:  Patient is a 46 year old Caucasian female with a past medical history of diabetes, hypertension, dyslipidemia, prior smoker (quit 2 years ago), morbid obesity, history of obstructive apnea noncompliant to CPAP (secondary to financial issues) who presented to the ED for evaluation of the above noted complaints. The patient for the past 2 weeks she has been having exertional dyspnea. She claims that even walking around the house makes [name] of breath. She claims that she does not get [name] of breath at rest, and the shortness of breath is exclusively on exertion. She claims that she is now unable to lie flat in is not sleeping on a couch. She claims that this has now been associated with worsening bilateral lower extremity edema. She claims she has gained approximately 15 pounds over the past 2 weeks. She also claims she's been having some chest pain for the past 2 weeks as  well. Patient claims that the pain is mostly sharp and located in the lower sternal area, she claims that the pain is easily reproducible on deep palpation. There is no radiation of the pain, it is not associated with nausea vomiting. Patient claims that she has been under a lot of stress recently, and at this point would not like to discuss about this.   Patient claims that she has some dry cough. She chills denies any  headache. She denies any abdominal pain.Denies any fever. denies any nausea vomiting or diarrhea.   Upon presentation to the ED, it was noted that her blood pressure was 200/117. I was subsequently asked to admit this patient for further evaluation and treatment.   Consultants:  Cardiology  Pulmonology  Procedures:  CT angio chest  CT neck  Antibiotics:  Doxycycline 10/28>>  HPI/Subjective: Patient states she feels better and she was able to ambulated around the hall today.  Her cough is improving.  She states the swelling in her legs is improved after diuresis yesterday.      Objective: Filed Vitals:   01/21/12 0459 01/21/12 0857 01/21/12 1321 01/21/12 1424  BP: 151/81   116/77  Pulse: 100  69 65  Temp: 98.4 F (36.9 C)   97.7 F (36.5 C)  TempSrc: Oral     Resp: 18   20  Height:      Weight: 132.4 kg (291 lb 14.2 oz)     SpO2: 100% 95% 96% 94%    Intake/Output Summary (Last 24 hours) at 01/21/12 1908 Last data filed at 01/21/12 1744  Gross per 24 hour  Intake    963 ml  Output   4100 ml  Net  -3137 ml   Filed Weights   01/17/12 0430 01/19/12 0530 01/21/12 0459  Weight: 136.986 kg (302 lb) 133.358 kg (294 lb) 132.4 kg (291 lb 14.2 oz)    Exam:   General:  Obese CF, no acute distress  HEENT:  MMM, face appears less edematous  Cardiovascular:  RRR, no murmurs, rubs or gallops  Respiratory:  CTAB  Abdomen: NABS, soft, nondistended  EXt:  1+ slow pitting LEE  Data Reviewed: Basic Metabolic Panel:  Lab 01/21/12 9604 01/20/12 0635 01/19/12 0605 01/18/12 1112 01/17/12 0237  NA 136 137 139 137 138  K 4.1 2.9* 3.5 3.3* 3.4*  CL 93* 91* 94* 93* 95*  CO2 34* 38* 37* 37* 33*  GLUCOSE 138* 70 122* 162* 216*  BUN 18 19 16 12 12   CREATININE 0.79 0.80 0.76 0.66 0.71  CALCIUM 10.1 9.9 9.6 9.4 9.3  MG -- -- -- -- --  PHOS -- -- -- -- --   Liver Function Tests:  Lab 01/17/12 0237  AST 29  ALT 38*  ALKPHOS 111  BILITOT 0.3  PROT 7.5  ALBUMIN 3.7     No results found for this basename: LIPASE:5,AMYLASE:5 in the last 168 hours No results found for this basename: AMMONIA:5 in the last 168 hours CBC:  Lab 01/17/12 0237 01/16/12 1616 01/16/12 1043  WBC 12.9* 10.4 10.7*  NEUTROABS -- -- --  HGB 11.4* 11.1* 11.8*  HCT 37.1 35.4* 37.6  MCV 87.9 86.8 86.6  PLT 290 287 268   Cardiac Enzymes:  Lab 01/17/12 0236 01/16/12 2108 01/16/12 1615  CKTOTAL -- -- --  CKMB -- -- --  CKMBINDEX -- -- --  TROPONINI <0.30 <0.30 <0.30   BNP (last 3 results)  Basename 01/17/12 0237 01/16/12 1042  PROBNP 590.3* 503.2*  CBG:  Lab 01/21/12 1705 01/21/12 1401 01/21/12 1153 01/21/12 0753 01/20/12 2124  GLUCAP 239* 199* 213* 121* 184*    No results found for this or any previous visit (from the past 240 hour(s)).   Studies: Ct Soft Tissue Neck W Contrast  01/21/2012  *RADIOLOGY REPORT*  Clinical Data: 46 year old female with goiter and tracheal compression.  CT NECK WITH CONTRAST  Technique:  Multidetector CT imaging of the neck was performed with intravenous contrast.  Contrast: 80mL OMNIPAQUE IOHEXOL 300 MG/ML  SOLN  Comparison: Chest CTA 01/16/2012.  Paranasal sinus CT 09/07/2010.  Findings: Large body habitus.  Mild respiratory motion artifact through the upper lungs which otherwise appear normal.  Left greater than right thyromegaly. In general, the gland encompasses 46 x 68 x 55 mm (AP by transverse by CC).  Mild compression and rightward deviation of the trachea at the level of the enlarged left thyroid lobe.  The degree of tracheal narrowing does not appear significant, and cross-sectional area of the tracheal is reduced by 30%.  Otherwise the visualized major airways are patent.  Within the enlarged left lobe, there is only a solitary rim calcified 11-12 mm nodule evident.  No discrete nodule evident in the right lobe.  No lymphadenopathy at level IV or at the thoracic inlet.  Larynx is within normal limits.  Retropharyngeal course of the  bilateral carotid arteries results in mild effacement of the posterior pharynx.  Pharynx otherwise within normal limits. Retropharyngeal space otherwise within normal limits. Parapharyngeal and sublingual spaces within normal limits. Submandibular and parotid glands within normal limits.  No cervical lymphadenopathy.  Visualized major vascular structures are patent.  Visualized paranasal sinuses and mastoids are clear.  No acute osseous abnormality identified.  Negative visualized brain parenchyma. Visualized orbit soft tissues are within normal limits.  IMPRESSION: 1.  Generalized thyromegaly.  Mild rightward deviation and compression of the trachea, but no significant airway narrowing (decreased tracheal cross-sectional area of 30%). 2.  12 mm partially calcified left thyroid lobe nodule.  Consider further evaluation with thyroid ultrasound.  The patient is clinically hyperthyroid, consider nuclear medicine thyroid scan. 3.  Otherwise no acute findings in the neck.   Original Report Authenticated By: Ulla Potash III, M.D.     Scheduled Meds:    . albuterol  2.5 mg Nebulization Q6H  . bisoprolol  5 mg Oral Daily  . buPROPion  150 mg Oral Daily  . dextromethorphan-guaiFENesin  2 tablet Oral BID  . doxycycline  100 mg Oral Q12H  . enoxaparin (LOVENOX) injection  60 mg Subcutaneous Q24H  . ferrous sulfate  325 mg Oral BID WC  . fluticasone  2 spray Each Nare Daily  . furosemide  80 mg Intravenous Q24H  . insulin aspart  0-15 Units Subcutaneous TID WC  . insulin aspart  4 Units Subcutaneous TID WC  . insulin glargine  60 Units Subcutaneous QHS  . irbesartan  300 mg Oral Daily  . loratadine  10 mg Oral Daily  . metolazone  5 mg Oral Once  . multivitamin with minerals  1 tablet Oral QHS  . pantoprazole  40 mg Oral BID AC  . potassium chloride  40 mEq Oral BID  . predniSONE  40 mg Oral Q breakfast  . sertraline  100 mg Oral Daily  . simvastatin  20 mg Oral QPM  . sodium chloride  3 mL Intravenous  Q12H  . spironolactone  25 mg Oral Daily   Continuous Infusions:   Principal Problem:  *  Hypertensive urgency Active Problems:  DIABETES MELLITUS, TYPE II  HYPERLIPIDEMIA  DEPRESSION  GERD  OBESITY, MORBID  CHF (congestive heart failure)  Chest pain  Anxiety  Acute diastolic heart failure  OSA (obstructive sleep apnea)  Hypokalemia  Acute bronchitis    Time spent: 30    Joshuajames Moehring, [facility]  [facility] Pager [phone]. If 8PM-8AM, please contact night-coverage at [email or web address], password [facility] 01/21/2012, 7:08 PM  LOS: 5 days

## 2012-01-21 NOTE — Progress Notes (Signed)
Advanced Heart Failure Rounding Note   Subjective:    [name] is a 46 y/o F hx of DM, HTN, HL, morbid obesity, OSA noncompliant with CPAP due to finances, anxiety whose cardiac hx includes absence of coronary artery disease on cath in 2006 for CP with only catheter-induced spasm of RCA. EF was 65% at that time.  Echo 10/24: EF 50-55%. Grade 2 diastolic dysfunction.  RV mildly dilated.  PAPP 68 mmHg  Weight stable overnight. Breathing better. Continues with productive cough. Doxy started yesterday,   Objective:   Weight Range:  Vital Signs:   Temp:  [97.4 F (36.3 C)-98.4 F (36.9 C)] 98.4 F (36.9 C) (10/29 0459) Pulse Rate:  [71-100] 100  (10/29 0459) Resp:  [18] 18  (10/29 0459) BP: (107-151)/(70-82) 151/81 mmHg (10/29 0459) SpO2:  [91 %-100 %] 95 % (10/29 0857) Weight:  [132.4 kg (291 lb 14.2 oz)] 132.4 kg (291 lb 14.2 oz) (10/29 0459) Last BM Date: 01/20/12  Weight change: Filed Weights   01/17/12 0430 01/19/12 0530 01/21/12 0459  Weight: 136.986 kg (302 lb) 133.358 kg (294 lb) 132.4 kg (291 lb 14.2 oz)    Intake/Output:   Intake/Output Summary (Last 24 hours) at 01/21/12 1138 Last data filed at 01/21/12 0817  Gross per 24 hour  Intake    483 ml  Output   3000 ml  Net  -2517 ml     Physical Exam: General: Hacking cough. No resp difficulty HEENT: normal Neck: supple. JVP hard to see appears 7. Carotids 2+ bilat; no bruits. No lymphadenopathy or thryomegaly appreciated. Cor: PMI nondisplaced. Regular rate & rhythm. No rubs, gallops or murmurs. Lungs: clear Abdomen: obese  nontender, nondistended.  No bruits or masses. Good bowel sounds. Extremities: no cyanosis, clubbing, rash, tr- 1+ ankle edema Neuro: alert & orientedx3, cranial nerves grossly intact. moves all 4 extremities w/o difficulty. Affect pleasant   Labs: Basic Metabolic Panel:  Lab 01/21/12 9147 01/20/12 0635 01/19/12 0605 01/18/12 1112 01/17/12 0237  NA 136 137 139 137 138  K 4.1 2.9* 3.5  3.3* 3.4*  CL 93* 91* 94* 93* 95*  CO2 34* 38* 37* 37* 33*  GLUCOSE 138* 70 122* 162* 216*  BUN 18 19 16 12 12   CREATININE 0.79 0.80 0.76 0.66 0.71  CALCIUM 10.1 9.9 9.6 -- --  MG -- -- -- -- --  PHOS -- -- -- -- --    Liver Function Tests:  Lab 01/17/12 0237  AST 29  ALT 38*  ALKPHOS 111  BILITOT 0.3  PROT 7.5  ALBUMIN 3.7   No results found for this basename: LIPASE:5,AMYLASE:5 in the last 168 hours No results found for this basename: AMMONIA:3 in the last 168 hours  CBC:  Lab 01/17/12 0237 01/16/12 1616 01/16/12 1043  WBC 12.9* 10.4 10.7*  NEUTROABS -- -- --  HGB 11.4* 11.1* 11.8*  HCT 37.1 35.4* 37.6  MCV 87.9 86.8 86.6  PLT 290 287 268    Cardiac Enzymes:  Lab 01/17/12 0236 01/16/12 2108 01/16/12 1615  CKTOTAL -- -- --  CKMB -- -- --  CKMBINDEX -- -- --  TROPONINI <0.30 <0.30 <0.30    BNP: BNP (last 3 results)  Basename 01/17/12 0237 01/16/12 1042  PROBNP 590.3* 503.2*     Other results:  EKG:   Imaging: Ct Soft Tissue Neck W Contrast  01/21/2012  *RADIOLOGY REPORT*  Clinical Data: 46 year old female with goiter and tracheal compression.  CT NECK WITH CONTRAST  Technique:  Multidetector CT imaging  of the neck was performed with intravenous contrast.  Contrast: 80mL OMNIPAQUE IOHEXOL 300 MG/ML  SOLN  Comparison: Chest CTA 01/16/2012.  Paranasal sinus CT 09/07/2010.  Findings: Large body habitus.  Mild respiratory motion artifact through the upper lungs which otherwise appear normal.  Left greater than right thyromegaly. In general, the gland encompasses 46 x 68 x 55 mm (AP by transverse by CC).  Mild compression and rightward deviation of the trachea at the level of the enlarged left thyroid lobe.  The degree of tracheal narrowing does not appear significant, and cross-sectional area of the tracheal is reduced by 30%.  Otherwise the visualized major airways are patent.  Within the enlarged left lobe, there is only a solitary rim calcified 11-12 mm nodule  evident.  No discrete nodule evident in the right lobe.  No lymphadenopathy at level IV or at the thoracic inlet.  Larynx is within normal limits.  Retropharyngeal course of the bilateral carotid arteries results in mild effacement of the posterior pharynx.  Pharynx otherwise within normal limits. Retropharyngeal space otherwise within normal limits. Parapharyngeal and sublingual spaces within normal limits. Submandibular and parotid glands within normal limits.  No cervical lymphadenopathy.  Visualized major vascular structures are patent.  Visualized paranasal sinuses and mastoids are clear.  No acute osseous abnormality identified.  Negative visualized brain parenchyma. Visualized orbit soft tissues are within normal limits.  IMPRESSION: 1.  Generalized thyromegaly.  Mild rightward deviation and compression of the trachea, but no significant airway narrowing (decreased tracheal cross-sectional area of 30%). 2.  12 mm partially calcified left thyroid lobe nodule.  Consider further evaluation with thyroid ultrasound.  The patient is clinically hyperthyroid, consider nuclear medicine thyroid scan. 3.  Otherwise no acute findings in the neck.   Original Report Authenticated By: Harley Hallmark, M.D.      Medications:     Scheduled Medications:    . albuterol  2.5 mg Nebulization Q6H  . bisoprolol  5 mg Oral Daily  . buPROPion  150 mg Oral Daily  . dextromethorphan-guaiFENesin  2 tablet Oral BID  . doxycycline  100 mg Oral Q12H  . enoxaparin (LOVENOX) injection  60 mg Subcutaneous Q24H  . ferrous sulfate  325 mg Oral BID WC  . fluticasone  2 spray Each Nare Daily  . furosemide  80 mg Intravenous Q24H  . insulin aspart  0-15 Units Subcutaneous TID WC  . insulin aspart  4 Units Subcutaneous TID WC  . insulin glargine  60 Units Subcutaneous QHS  . irbesartan  300 mg Oral Daily  . loratadine  10 mg Oral Daily  . multivitamin with minerals  1 tablet Oral QHS  . pantoprazole  40 mg Oral BID AC  .  potassium chloride  40 mEq Oral BID  . predniSONE  40 mg Oral Q breakfast  . sertraline  100 mg Oral Daily  . simvastatin  20 mg Oral QPM  . sodium chloride  3 mL Intravenous Q12H  . spironolactone  25 mg Oral Daily    Infusions:    PRN Medications: sodium chloride, acetaminophen, acetaminophen, albuterol, alum & mag hydroxide-simeth, dextrose, dextrose, gi cocktail, iohexol, LORazepam, nitroGLYCERIN, ondansetron (ZOFRAN) IV, ondansetron, sodium chloride, sodium chloride, zolpidem   Assessment:   1. Acute diastolic HF and obesity hypoventialtion syndrome  2. Chest wall pain  3. Uncontrolled HTN  4. Diabetes mellitus  5. Morbid obesity BMI 50.6  6. Severe OSA by sleep study 2012 - noncompliant with CPAP due to finances. SW to  get involved for possible assistance. Placing CPAP in hospital.  7. Rectal bleeding with hx of IDA, colon polyps, family hx of colon cancer  8. PCOS. 9. Hypokalemia 10. Acute bronchitis  Patient seen and examined with Ulyess Blossom, PA-C. We discussed all aspects of the encounter. I agree with the assessment as stated above. See my thoughts below.   Plan/Discussion:    Volume status much improved. Will give one more dose IV lasix and metolazone. Should be ready for d/c from our standpoint tomorrow. On d/c would use demadex 40 daily. Will need 1 week f/u in HF clinic for weight check and BMET.   Mohamedamin Nifong,MD 11:48 AM

## 2012-01-21 NOTE — Progress Notes (Signed)
CM placed call to [facility] sleep disorder clinic for records on pt's last sleep study. Information received via fax. CM will provide [facility] with information for cpap. Thanks Gala Lewandowsky, RN, BSN [phone]

## 2012-01-22 LAB — GLUCOSE, CAPILLARY
Glucose-Capillary: 192 mg/dL — ABNORMAL HIGH (ref 70–99)
Glucose-Capillary: 196 mg/dL — ABNORMAL HIGH (ref 70–99)

## 2012-01-22 LAB — BASIC METABOLIC PANEL
BUN: 22 mg/dL (ref 6–23)
Calcium: 10.2 mg/dL (ref 8.4–10.5)
Creatinine, Ser: 0.97 mg/dL (ref 0.50–1.10)
GFR calc non Af Amer: 69 mL/min — ABNORMAL LOW (ref >90)
Glucose, Bld: 149 mg/dL — ABNORMAL HIGH (ref 70–99)
Potassium: 4.2 mEq/L (ref 3.5–5.1)

## 2012-01-22 LAB — ACTH: C206 ACTH: <5 pg/mL — ABNORMAL LOW (ref 10–46)

## 2012-01-22 MED ORDER — POTASSIUM CHLORIDE CRYS ER 20 MEQ PO TBCR: 40.0000 meq | EXTENDED_RELEASE_TABLET | Freq: Every day | ORAL | Status: DC

## 2012-01-22 MED ORDER — POTASSIUM CHLORIDE CRYS ER 20 MEQ PO TBCR
40.0000 meq | EXTENDED_RELEASE_TABLET | ORAL | Status: AC
  Administered 2012-01-22: 40 meq via ORAL
  Filled 2012-01-22: qty 2

## 2012-01-22 MED ORDER — SPIRONOLACTONE 25 MG PO TABS: 25.0000 mg | ORAL_TABLET | Freq: Every day | ORAL | Status: DC

## 2012-01-22 MED ORDER — TORSEMIDE 20 MG PO TABS: 40.0000 mg | ORAL_TABLET | Freq: Every day | ORAL | Status: DC

## 2012-01-22 MED ORDER — FLUTICASONE PROPIONATE 50 MCG/ACT NA SUSP: 2.0000 | Freq: Every day | NASAL | Status: DC

## 2012-01-22 MED ORDER — BISOPROLOL FUMARATE 5 MG PO TABS: 5.0000 mg | ORAL_TABLET | Freq: Every day | ORAL | Status: DC

## 2012-01-22 MED ORDER — TORSEMIDE 20 MG PO TABS
40.0000 mg | ORAL_TABLET | Freq: Every day | ORAL | Status: DC
  Administered 2012-01-22: 40 mg via ORAL
  Filled 2012-01-22: qty 2

## 2012-01-22 MED ORDER — PREDNISONE 20 MG PO TABS: ORAL_TABLET | ORAL | Status: DC

## 2012-01-22 MED ORDER — DOXYCYCLINE HYCLATE 100 MG PO TABS: 100.0000 mg | ORAL_TABLET | Freq: Two times a day (BID) | ORAL | Status: DC

## 2012-01-22 NOTE — Progress Notes (Signed)
SATURATION QUALIFICATIONS:  Patient Saturations on Room Air at Rest = 95%  Patient Saturations on Room Air while Ambulating = 77%  Patient Saturations on 2 Liters of oxygen while Ambulating = 95%  Statement of medical necessity for home oxygen:

## 2012-01-22 NOTE — Progress Notes (Signed)
  Patient's am cortisol is markedly elevated. Appears cushingoid on exam. With thyromegaly probably needs endocrine evaluation to help sort out. I am not sure how this relates to her PCOS diagnosis.   HF much improved.   Heitor Steinhoff,MD 10:12 AM

## 2012-01-22 NOTE — Progress Notes (Signed)
Verbal Telephone Order from Dr. Blake Divine: Patient may return to work on 01/29/12, after seeing Cardiologist AND Pulmonologist.   This was written on d/c paperwork as well.

## 2012-01-22 NOTE — Progress Notes (Signed)
Pt on Room Air, ambulated down hall to Nurse's station, and around the half circle, then back to room, total of about 144ft.  SpO2 was 95% at the beginning of the walk.  Toward the middle, SpO2 down to 77%, as pt leaned over and held on to rail to catch her breath.  Pt stated her back was hurting and she was SOB.  While walking back to room from RN station, SpO2 was 92%.  After getting back to her room and sitting down, SpO2 went down to 82%.  O2 was then applied at 2L via [place].  Dr. Blake Divine notified.

## 2012-01-22 NOTE — Discharge Summary (Signed)
Physician Discharge Summary  COSETTA QU WUJ:[id number] DOB: [birth year] DOA: 01/16/2012  PCP: Roxy Manns, MD  Admit date: 01/16/2012 Discharge date: 01/22/2012  Time spent: 82 minutes  Recommendations for Outpatient Follow-up:  1. Follow up with heart failure clinic in one week.  2. Please follow up with PCP, regarding the elevated cortisol level,( patient is on steroids) but she has cushingoid features and thyromegaly, evaluate for cushings disease, with possible referral to endocrinologist for further evaluation.   Discharge Diagnoses:  Principal Problem:  *Hypertensive urgency Active Problems:  DIABETES MELLITUS, TYPE II  HYPERLIPIDEMIA  DEPRESSION  GERD  OBESITY, MORBID  CHF (congestive heart failure)  Chest pain  Anxiety  Acute diastolic heart failure  OSA (obstructive sleep apnea)  Hypokalemia  Acute bronchitis  Thyroid nodule   Discharge Condition: stable  Diet recommendation: low salt diet.   Filed Weights   01/19/12 0530 01/21/12 0459 01/22/12 0500  Weight: 133.358 kg (294 lb) 132.4 kg (291 lb 14.2 oz) 129.2 kg (284 lb 13.4 oz)    History of present illness:  Patient is a 46 year old Caucasian female with a past medical history of diabetes, hypertension, dyslipidemia, prior smoker (quit 2 years ago), morbid obesity, history of obstructive apnea noncompliant to CPAP (secondary to financial issues) who presented to the ED for evaluation of the above noted complaints. The patient for the past 2 weeks she has been having exertional dyspnea. She claims that even walking around the house makes her short of breath. She claims that she does not get short of breath at rest, and the shortness of breath is exclusively on exertion. She claims that she is now unable to lie flat in is not sleeping on a couch. She claims that this has now been associated with worsening bilateral lower extremity edema. She claims she has gained approximately 15 pounds over the past 2 weeks.  She also claims she's been having some chest pain for the past 2 weeks as well. Patient claims that the pain is mostly sharp and located in the lower sternal area, she claims that the pain is easily reproducible on deep palpation. There is no radiation of the pain, it is not associated with nausea vomiting. Patient claims that she has been under a lot of stress recently, and at this point would not like to discuss about this.  Patient claims that she has some dry cough. She chills denies any headache. She denies any abdominal pain.Denies any fever. denies any nausea vomiting or diarrhea.  Upon presentation to the ED, it was noted that her blood pressure was 200/117. I was subsequently asked to admit this patient for further evaluation and treatment   Hospital Course:   Dyspnea: Markedly improved. Likely due to OSA with secondary pulmonary arterial hypertension and diastolic heart failure. CTa was negative for PE. ECHO demonstrated normal EF with grade 2 diastolic dysfunction and moderately dilated RV with peak PA pressure of . Differential included asthma (although per pulmonology patient had minimal obstruction on PFTs), tracheomalacia (also less likely per pulmonology), goiter compressing airway (possible), or allergy/reflux.  Patient was diuresed with lasix 80mg  IV daily (has diuresed 18L since admission per flow sheet) , later on her diuretics were changed to torsemide 40mg  daily on discharge  she was asked to Continue high dose PPI increased for possible GERD , to be compliant to CPAP every night. She was also recommended to continue flonase, and to complete the tapering dose of steroids and doxycycline.  Pulmonary recommended repeat outpatient PFTs with  Dr [name] who she was as outpatient. She was recommended to make an appointment in 2 to 4 weeks. We have asked to ambulate without oxygen and her oxygen saturations have dropped to 77% and with oxygen of 2 liters her saturations have remained  greater than 95%. Hence we will be sending her home on 2lit/min oxygen.   Goiter: Thyromegaly with deviation of trachea on CT. Spoke with ENT who recommended CT neck and outpatient follow up.  - TSH 1.5 wnl and fT4 1.35  - CT neck demonstrates >1cm calcified nodule. Will defer further work up to ENT as may not need biopsy if they plan to surgically remove goiter.   Epistaxis, likely due to dry air, resolved. Patient not on blood thinner  - Continued cold compresses . - Humidify air  - Nasal saline as needed.   Hypertensive urgency/malignant hypertension. Blood pressure markedly improved  - Continue irbesartan and beta blocker   .DIABETES MELLITUS, TYPE II, uncontrolled.  CBG (last 3)   Basename 01/22/12 1708 01/22/12 1132 01/22/12 0740  GLUCAP 196* 192* 150*     - Continue Lantus to 60units qhs    .HYPERLIPIDEMIA, stable. continue with Statins   .OBESITY, MORBID  -Counseled regarding the importance of weight loss.   Marland KitchenGERD on PPI  .DEPRESSION/anxiety, stable. Continue with regular meds and xanax prn.  Hypokalemia: LIkely due to lasix.  - Repleted.   Blood clots in urine: Urine is clear. Patient has 2-yo Mirena IUD in place and has GYN appointment in a few days. Told her to keep appointment .  Elevated Random cortisol level: patient is on steroids, but  with cushingoid features, and thyromegaly, repeat values after the steroid taper and if needed she should be referred to an endocrinologist as outpatient.       Consultations:  Cardiology  Pulmonary  Discharge Exam: Filed Vitals:   01/22/12 0500 01/22/12 0907 01/22/12 1402 01/22/12 1530  BP: 129/80  114/74   Pulse: 64  67   Temp: 97.7 F (36.5 C)  97.9 F (36.6 C)   TempSrc:   Oral   Resp: 18  18   Height:      Weight: 129.2 kg (284 lb 13.4 oz)     SpO2: 99% 97% 95% 94%   General: Obese CF, no acute distress  HEENT: MMM, face appears less edematous  Cardiovascular: RRR, no murmurs, rubs or gallops    Respiratory: CTAB  Abdomen: NABS, soft, nondistended  EXt: 1+ slow pitting LEE    Discharge Instructions  Discharge Orders    Future Appointments: Provider: Department: Dept Phone: Center:   01/27/2012 3:45 PM Leslye Peer, MD [facility] [phone] None     Future Orders Please Complete By Expires   Diet - low sodium heart healthy      Increase activity slowly      Call MD for:  persistant dizziness or light-headedness      (HEART FAILURE PATIENTS) Call MD:  Anytime you have any of the following symptoms: 1) 3 pound weight gain in 24 hours or 5 pounds in 1 week 2) shortness of breath, with or without a dry hacking cough 3) swelling in the hands, feet or stomach 4) if you have to sleep on extra pillows at night in order to breathe.      Discharge instructions      Comments:   Follow up with PCP in one to two weeks  And evaluate for cushing's disease and thyromegaly with an ultrasound of the  thyroid. And possible referral to endocrinologist.       Medication List     As of 01/22/2012  6:17 PM    TAKE these medications         bisoprolol 5 MG tablet   Commonly known as: ZEBETA   Take 1 tablet (5 mg total) by mouth daily.      buPROPion 150 MG 24 hr tablet   Commonly known as: WELLBUTRIN XL   Take 150 mg by mouth daily.      cetirizine 10 MG tablet   Commonly known as: ZYRTEC   Take 10 mg by mouth at bedtime.      doxycycline 100 MG tablet   Commonly known as: VIBRA-TABS   Take 1 tablet (100 mg total) by mouth every 12 (twelve) hours.      ferrous sulfate 325 (65 FE) MG tablet   Take 325 mg by mouth 2 (two) times daily.      FISH OIL PO   Take 2 capsules by mouth daily.      fluticasone 50 MCG/ACT nasal spray   Commonly known as: FLONASE   Place 2 sprays into the nose daily.      glimepiride 4 MG tablet   Commonly known as: AMARYL   Take 4 mg by mouth daily before breakfast.      insulin glargine 100 UNIT/ML injection   Commonly known as: LANTUS    Inject 60 Units into the skin at bedtime.      metFORMIN 1000 MG tablet   Commonly known as: GLUCOPHAGE   Take 1 tablet (1,000 mg total) by mouth 2 (two) times daily with a meal.      multivitamin capsule   Take 1 capsule by mouth at bedtime.      pantoprazole 40 MG tablet   Commonly known as: PROTONIX   Take 40 mg by mouth daily.      predniSONE 20 MG tablet   Commonly known as: DELTASONE   Prednisone 20 mg daily 2 days followed by   Prednisone 10 mg daily 3 days.      PROAIR HFA 108 (90 BASE) MCG/ACT inhaler   Generic drug: albuterol   Inhale 2 puffs into the lungs every 4 (four) hours as needed. For wheezing      sertraline 50 MG tablet   Commonly known as: ZOLOFT   Take 2 tablets (100 mg total) by mouth daily.      simvastatin 20 MG tablet   Commonly known as: ZOCOR   Take 20 mg by mouth every evening.      spironolactone 25 MG tablet   Commonly known as: ALDACTONE   Take 1 tablet (25 mg total) by mouth daily.      telmisartan 80 MG tablet   Commonly known as: MICARDIS   Take 1 tablet (80 mg total) by mouth daily.      torsemide 20 MG tablet   Commonly known as: DEMADEX   Take 2 tablets (40 mg total) by mouth daily.      zolpidem 10 MG tablet   Commonly known as: AMBIEN   Take 0.5-1 tablets (5-10 mg total) by mouth at bedtime as needed. For sleep           Follow-up Information    Schedule an appointment as soon as possible for a visit with Roxy Manns, MD.   Contact information:   [address] [place] [address] [place] [address] [phone]  Follow up with SHOEMAKER, DAVID, MD. Schedule an appointment as soon as possible for a visit in 4 weeks.   Contact information:   [address], [address], [address] [place] [address] [phone]       Follow up with Leslye Peer., MD. In 2 weeks.   Contact information:   [address] [facility] [place]  [address] [phone]         Follow up with Heart Failure clinic in one week for BMP and weight check.  The results of significant diagnostics from this hospitalization (including imaging, microbiology, ancillary and laboratory) are listed below for reference.    Significant Diagnostic Studies: Ct Soft Tissue Neck W Contrast  01/21/2012  *RADIOLOGY REPORT*  Clinical Data: 45 year old female with goiter and tracheal compression.  CT NECK WITH CONTRAST  Technique:  Multidetector CT imaging of the neck was performed with intravenous contrast.  Contrast: 80mL OMNIPAQUE IOHEXOL 300 MG/ML  SOLN  Comparison: Chest CTA 01/16/2012.  Paranasal sinus CT 09/07/2010.  Findings: Large body habitus.  Mild respiratory motion artifact through the upper lungs which otherwise appear normal.  Left greater than right thyromegaly. In general, the gland encompasses 46 x 68 x 55 mm (AP by transverse by CC).  Mild compression and rightward deviation of the trachea at the level of the enlarged left thyroid lobe.  The degree of tracheal narrowing does not appear significant, and cross-sectional area of the tracheal is reduced by 30%.  Otherwise the visualized major airways are patent.  Within the enlarged left lobe, there is only a solitary rim calcified 11-12 mm nodule evident.  No discrete nodule evident in the right lobe.  No lymphadenopathy at level IV or at the thoracic inlet.  Larynx is within normal limits.  Retropharyngeal course of the bilateral carotid arteries results in mild effacement of the posterior pharynx.  Pharynx otherwise within normal limits. Retropharyngeal space otherwise within normal limits. Parapharyngeal and sublingual spaces within normal limits. Submandibular and parotid glands within normal limits.  No cervical lymphadenopathy.  Visualized major vascular structures are patent.  Visualized paranasal sinuses and mastoids are clear.  No acute osseous abnormality identified.  Negative visualized brain  parenchyma. Visualized orbit soft tissues are within normal limits.  IMPRESSION: 1.  Generalized thyromegaly.  Mild rightward deviation and compression of the trachea, but no significant airway narrowing (decreased tracheal cross-sectional area of 30%). 2.  12 mm partially calcified left thyroid lobe nodule.  Consider further evaluation with thyroid ultrasound.  The patient is clinically hyperthyroid, consider nuclear medicine thyroid scan. 3.  Otherwise no acute findings in the neck.   Original Report Authenticated By: Harley Hallmark, M.D.    Ct Angio Chest Pe W/cm &/or Wo Cm  01/16/2012  *RADIOLOGY REPORT*  Clinical Data: Chest pain, shortness of breath, elevated D-dimer, evaluate for pulmonary embolism  CT ANGIOGRAPHY CHEST  Technique:  Multidetector CT imaging of the chest using the standard protocol during bolus administration of intravenous contrast. Multiplanar reconstructed images including MIPs were obtained and reviewed to evaluate the vascular anatomy.  Contrast: 80mL OMNIPAQUE IOHEXOL 350 MG/ML SOLN  Comparison: Chest CT - 10/12/2004  Vascular Findings:  There is suboptimal opacification of the pulmonary arterial system of the main pulmonary artery measuring 161 HU.  There are no discrete filling defects within the central pulmonary arterial system to the level of the bilateral segmental pulmonary arteries. There is marked enlargement of the main caliber the main pulmonary artery,  measuring approximately 4.2 cm in greatest transverse axial dimension, unchanged.  Cardiomegaly.  No pericardial effusion.  Normal caliber of the thoracic aorta.  Bovine configuration of the aortic arch.  No definite periaortic stranding.  Nonvascular findings:  Evaluation of the pulmonary parenchyma is degraded secondary to patient body habitus and respiratory artifact.  Linear heterogeneous opacities within the left lower lobe and geographic area of peripheral consolidation within the right lower lobe (image 36) are  favored to represent atelectasis. Scattered areas of ground-glass lucency are nonspecific but may be seen in the setting of airways disease.  There is minimal collapse of the distal aspect of the trachea and bilateral main pulmonary bronchi, nonspecific but may be seen in setting of tracheomalacia. The central airways remain patent.  No pleural effusion or pneumothorax.  Shoddy mediastinal lymph nodes are not enlarged by CT criteria with index precarinal node measuring 1 cm in short axis diameter (image 27, series 4 and index prevascular lymph node measuring 6 mm in short axis diameter (image 21).  No definite hilar or axillary lymphadenopathy.  Incidental imaging of the upper abdomen is normal.  There is an apparent interval enlargement of the left lobe of the thyroid, now with mass effect and rightward deviation of the tracheal air column.  This finding without a discrete thyroid nodule.  No acute or aggressive osseous abnormalities.  IMPRESSION:  1.  Degraded examination secondary to patient body habitus, respiratory artifact and suboptimal opacification of the pulmonary arterial system.  2.  No evidence of central pulmonary embolism.  If clinical concern persists for pulmonary embolism, further evaluation with VQ scan or bilateral lower extremity venous Doppler ultrasound may be performed as clinically indicated. 3.  Cardiomegaly and enlargement of the pulmonary arteries suggestive of pulmonary arterial hypertension.  Further evaluation with cardiac echo may be performed as clinically indicated. 4.  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  Further evaluation with PFTs and/or referral to Pulmonology may be performed as clinically indicated.  5.  Apparent enlargement of the left lobe of the thyroid with mass effect upon the trachea.  Further evaluation with dedicated thyroid ultrasound in the nonemergent setting is recommended.   Original Report Authenticated By: Waynard Reeds, M.D.    Dg Chest Portable 1 View  01/16/2012  *RADIOLOGY REPORT*  Clinical Data: Chest pressure.  Shortness of breath.  PORTABLE CHEST - 1 VIEW  Comparison: PA and lateral chest 08/08/2010.  Findings: There is cardiomegaly and pulmonary vascular congestion. No consolidative process, pneumothorax or effusion is identified.  IMPRESSION: No acute finding.  Cardiomegaly and pulmonary vascular congestion.   Original Report Authenticated By: Bernadene Bell. Maricela Curet, M.D.     Microbiology: No results found for this or any previous visit (from the past 240 hour(s)).   Labs: Basic Metabolic Panel:  Lab 01/22/12 5409 01/21/12 0510 01/20/12 8119 01/19/12 0605 01/18/12 1112  NA 132* 136 137 139 137  K 4.2 4.1 2.9* 3.5 3.3*  CL 90* 93* 91* 94* 93*  CO2 35* 34* 38* 37* 37*  GLUCOSE 149* 138* 70 122* 162*  BUN 22 18 19 16 12   CREATININE 0.97 0.79 0.80 0.76 0.66  CALCIUM 10.2 10.1 9.9 9.6 9.4  MG -- -- -- -- --  PHOS -- -- -- -- --   Liver Function Tests:  Lab 01/17/12 0237  AST 29  ALT 38*  ALKPHOS 111  BILITOT 0.3  PROT 7.5  ALBUMIN 3.7   No results found for this basename:  LIPASE:5,AMYLASE:5 in the last 168 hours No results found for this basename: AMMONIA:5 in the last 168 hours CBC:  Lab 01/17/12 0237 01/16/12 1616 01/16/12 1043  WBC 12.9* 10.4 10.7*  NEUTROABS -- -- --  HGB 11.4* 11.1* 11.8*  HCT 37.1 35.4* 37.6  MCV 87.9 86.8 86.6  PLT 290 287 268   Cardiac Enzymes:  Lab 01/17/12 0236 01/16/12 2108 01/16/12 1615  CKTOTAL -- -- --  CKMB -- -- --  CKMBINDEX -- -- --  TROPONINI <0.30 <0.30 <0.30   BNP: BNP (last 3 results)  Basename 01/17/12 0237 01/16/12 1042  PROBNP 590.3* 503.2*   CBG:  Lab 01/22/12 1708 01/22/12 1132 01/22/12 0740 01/21/12 2031 01/21/12 1705  GLUCAP 196* 192* 150* 347* 239*       Signed:  Danon Lograsso  [facility] 01/22/2012, 6:17 PM

## 2012-01-22 NOTE — Progress Notes (Signed)
2 small blood clots found in pt urine.  Pt states it began 01/21/12.  States that at this time she also saw her IUD string in the hat after voiding; no parts of the actual IUD instrument seen.  Pt states she has a yearly OB/GYN appointment scheduled for 01/23/12.  Will notify Dr. Blake Divine and monitor pt further.  Pt to be d/c later this afternoon.

## 2012-01-22 NOTE — Progress Notes (Signed)
Physical Therapy Treatment Patient Details Name: [name] MRN: [id number] DOB: [birth year] Today's Date: 01/22/2012 Time: 4098-1191 PT Time Calculation (min): 13 min  PT Assessment / Plan / Recommendation Comments on Treatment Session  Pt admitted with CHF and continues to progress. Able to tolerate increased ambulation distance this treatment with O2 sats dropping to 86% with gait.     Follow Up Recommendations  No PT follow up;Supervision - Intermittent     Does the patient have the potential to tolerate intense rehabilitation     Barriers to Discharge        Equipment Recommendations  None recommended by PT    Recommendations for Other Services    Frequency Min 3X/week   Plan Discharge plan remains appropriate;Frequency remains appropriate    Precautions / Restrictions Precautions Precautions: None Restrictions Weight Bearing Restrictions: No   Pertinent Vitals/Pain None    Mobility  Bed Mobility Bed Mobility: Supine to Sit;Sit to Supine Supine to Sit: 7: Independent Sit to Supine: 7: Independent Transfers Transfers: Sit to Stand;Stand to Sit Sit to Stand: 7: Independent Stand to Sit: 7: Independent Ambulation/Gait Ambulation/Gait Assistance: 5: Supervision Ambulation Distance (Feet): 400 Feet Assistive device: None Ambulation/Gait Assistance Details: Verbal cues for tall posture and pursed lip breathing with monitoring to O2 sats throughout. Pt ambulated on RA with O2 93% before gait, 86% during gait, and 94% with rest after gait. Cues for rest breaks as needed. Gait Pattern: Step-through pattern;Decreased stride length Gait velocity: Slow gait speed Stairs: No Wheelchair Mobility Wheelchair Mobility: No    Exercises     PT Diagnosis:    PT Problem List:   PT Treatment Interventions:     PT Goals Acute Rehab PT Goals PT Goal Formulation: With patient Time For Goal Achievement: 01/25/12 Potential to Achieve Goals: Good PT Goal: Sit to Stand -  Progress: Met PT Goal: Stand to Sit - Progress: Met PT Goal: Ambulate - Progress: Progressing toward goal  Visit Information  Last PT Received On: 01/22/12 Assistance Needed: +1    Subjective Data  Subjective: "Excellent!" Patient Stated Goal: To go home   Cognition  Overall Cognitive Status: Appears within functional limits for tasks assessed/performed Arousal/Alertness: Awake/alert Orientation Level: Oriented X4 / Intact Behavior During Session: [facility] for tasks performed    Balance  Balance Balance Assessed: No  End of Session PT - End of Session Activity Tolerance: Patient tolerated treatment well Patient left: in bed;with call bell/phone within reach;with family/visitor present Nurse Communication: Mobility status   GP     Cephus Shelling 01/22/2012, 1:28 PM  01/22/2012 Cephus Shelling, PT, DPT [phone]

## 2012-01-22 NOTE — Progress Notes (Signed)
Advanced Heart Failure Rounding Note   Subjective:    [name] is a 46 y/o F hx of DM, HTN, HL, morbid obesity, OSA noncompliant with CPAP due to finances, anxiety whose cardiac hx includes absence of coronary artery disease on cath in 2006 for CP with only catheter-induced spasm of RCA. EF was 65% at that time.  Echo 10/24: EF 50-55%. Grade 2 diastolic dysfunction.  RV mildly dilated.  PAPP 68 mmHg  Weight down another 7 pounds overnight. Breathing better. Continues with productive cough. Ambulatory sats dropped to 77% today. Home O2 ordered.    Objective:   Weight Range:  Vital Signs:   Temp:  [97.7 F (36.5 C)-98.3 F (36.8 C)] 97.9 F (36.6 C) (10/30 1402) Pulse Rate:  [62-67] 67  (10/30 1402) Resp:  [18] 18  (10/30 1402) BP: (111-129)/(74-80) 114/74 mmHg (10/30 1402) SpO2:  [90 %-99 %] 94 % (10/30 1530) Weight:  [129.2 kg (284 lb 13.4 oz)] 129.2 kg (284 lb 13.4 oz) (10/30 0500) Last BM Date: 01/21/12  Weight change: Filed Weights   01/19/12 0530 01/21/12 0459 01/22/12 0500  Weight: 133.358 kg (294 lb) 132.4 kg (291 lb 14.2 oz) 129.2 kg (284 lb 13.4 oz)    Intake/Output:   Intake/Output Summary (Last 24 hours) at 01/22/12 1913 Last data filed at 01/22/12 1311  Gross per 24 hour  Intake    240 ml  Output   2900 ml  Net  -2660 ml     Physical Exam: General: Hacking cough. No resp difficulty HEENT: normal Neck: supple. JVP hard to see appears 7. Carotids 2+ bilat; no bruits. No lymphadenopathy or thryomegaly appreciated. Cor: PMI nondisplaced. Regular rate & rhythm. No rubs, gallops or murmurs. Lungs: clear Abdomen: obese  nontender, nondistended.  No bruits or masses. Good bowel sounds. Extremities: no cyanosis, clubbing, rash, tr ankle edema Neuro: alert & orientedx3, cranial nerves grossly intact. moves all 4 extremities w/o difficulty. Affect pleasant   Labs: Basic Metabolic Panel:  Lab 01/22/12 1610 01/21/12 0510 01/20/12 0635 01/19/12 0605 01/18/12  1112  NA 132* 136 137 139 137  K 4.2 4.1 2.9* 3.5 3.3*  CL 90* 93* 91* 94* 93*  CO2 35* 34* 38* 37* 37*  GLUCOSE 149* 138* 70 122* 162*  BUN 22 18 19 16 12   CREATININE 0.97 0.79 0.80 0.76 0.66  CALCIUM 10.2 10.1 9.9 -- --  MG -- -- -- -- --  PHOS -- -- -- -- --    Liver Function Tests:  Lab 01/17/12 0237  AST 29  ALT 38*  ALKPHOS 111  BILITOT 0.3  PROT 7.5  ALBUMIN 3.7   No results found for this basename: LIPASE:5,AMYLASE:5 in the last 168 hours No results found for this basename: AMMONIA:3 in the last 168 hours  CBC:  Lab 01/17/12 0237 01/16/12 1616 01/16/12 1043  WBC 12.9* 10.4 10.7*  NEUTROABS -- -- --  HGB 11.4* 11.1* 11.8*  HCT 37.1 35.4* 37.6  MCV 87.9 86.8 86.6  PLT 290 287 268    Cardiac Enzymes:  Lab 01/17/12 0236 01/16/12 2108 01/16/12 1615  CKTOTAL -- -- --  CKMB -- -- --  CKMBINDEX -- -- --  TROPONINI <0.30 <0.30 <0.30    BNP: BNP (last 3 results)  Basename 01/17/12 0237 01/16/12 1042  PROBNP 590.3* 503.2*     Other results:  EKG:   Imaging: Ct Soft Tissue Neck W Contrast  01/21/2012  *RADIOLOGY REPORT*  Clinical Data: 46 year old female with goiter and tracheal compression.  CT NECK WITH CONTRAST  Technique:  Multidetector CT imaging of the neck was performed with intravenous contrast.  Contrast: 80mL OMNIPAQUE IOHEXOL 300 MG/ML  SOLN  Comparison: Chest CTA 01/16/2012.  Paranasal sinus CT 09/07/2010.  Findings: Large body habitus.  Mild respiratory motion artifact through the upper lungs which otherwise appear normal.  Left greater than right thyromegaly. In general, the gland encompasses 46 x 68 x 55 mm (AP by transverse by CC).  Mild compression and rightward deviation of the trachea at the level of the enlarged left thyroid lobe.  The degree of tracheal narrowing does not appear significant, and cross-sectional area of the tracheal is reduced by 30%.  Otherwise the visualized major airways are patent.  Within the enlarged left lobe, there  is only a solitary rim calcified 11-12 mm nodule evident.  No discrete nodule evident in the right lobe.  No lymphadenopathy at level IV or at the thoracic inlet.  Larynx is within normal limits.  Retropharyngeal course of the bilateral carotid arteries results in mild effacement of the posterior pharynx.  Pharynx otherwise within normal limits. Retropharyngeal space otherwise within normal limits. Parapharyngeal and sublingual spaces within normal limits. Submandibular and parotid glands within normal limits.  No cervical lymphadenopathy.  Visualized major vascular structures are patent.  Visualized paranasal sinuses and mastoids are clear.  No acute osseous abnormality identified.  Negative visualized brain parenchyma. Visualized orbit soft tissues are within normal limits.  IMPRESSION: 1.  Generalized thyromegaly.  Mild rightward deviation and compression of the trachea, but no significant airway narrowing (decreased tracheal cross-sectional area of 30%). 2.  12 mm partially calcified left thyroid lobe nodule.  Consider further evaluation with thyroid ultrasound.  The patient is clinically hyperthyroid, consider nuclear medicine thyroid scan. 3.  Otherwise no acute findings in the neck.   Original Report Authenticated By: Harley Hallmark, M.D.      Medications:     Scheduled Medications:    . albuterol  2.5 mg Nebulization Q6H  . bisoprolol  5 mg Oral Daily  . buPROPion  150 mg Oral Daily  . dextromethorphan-guaiFENesin  2 tablet Oral BID  . doxycycline  100 mg Oral Q12H  . enoxaparin (LOVENOX) injection  60 mg Subcutaneous Q24H  . ferrous sulfate  325 mg Oral BID WC  . fluticasone  2 spray Each Nare Daily  . insulin aspart  0-15 Units Subcutaneous TID WC  . insulin aspart  5 Units Subcutaneous TID WC  . insulin glargine  60 Units Subcutaneous QHS  . irbesartan  300 mg Oral Daily  . loratadine  10 mg Oral Daily  . multivitamin with minerals  1 tablet Oral QHS  . pantoprazole  40 mg Oral BID  AC  . potassium chloride  40 mEq Oral NOW  . predniSONE  20 mg Oral Q breakfast  . sertraline  100 mg Oral Daily  . simvastatin  20 mg Oral QPM  . sodium chloride  3 mL Intravenous Q12H  . spironolactone  25 mg Oral Daily  . torsemide  40 mg Oral Daily  . DISCONTD: furosemide  80 mg Intravenous Q24H  . DISCONTD: potassium chloride  40 mEq Oral BID  . DISCONTD: potassium chloride  40 mEq Oral Daily  . DISCONTD: predniSONE  40 mg Oral Q breakfast    Infusions:    PRN Medications: sodium chloride, acetaminophen, acetaminophen, albuterol, alum & mag hydroxide-simeth, gi cocktail, LORazepam, nitroGLYCERIN, ondansetron (ZOFRAN) IV, ondansetron, sodium chloride, sodium chloride, zolpidem   Assessment:  1. Acute diastolic HF and obesity hypoventialtion syndrome  2. Chest wall pain  3. Uncontrolled HTN  4. Diabetes mellitus  5. Morbid obesity BMI 50.6  6. Severe OSA by sleep study 2012 - noncompliant with CPAP due to finances. SW to get involved for possible assistance. Placing CPAP in hospital.  7. Rectal bleeding with hx of IDA, colon polyps, family hx of colon cancer  8. PCOS. 9. Hypokalemia 10. Acute bronchitis 11. Possible Cushing's syndrome  Patient seen and examined with Ulyess Blossom, PA-C. We discussed all aspects of the encounter. I agree with the assessment as stated above. See my thoughts below.   Plan/Discussion:    Volume status much improved. Ok to d/c today from our standpoint on demadex 40 daily. Will need 1 week f/u in HF clinic for weight check and BMET. This has been arranged for next Tuesday.  Agree with home O2. Has f/u with Dr. [name] on Monday.  Will need to see endo for PCOS and apparent Cushing's.  Danyl Deems,MD 7:13 PM

## 2012-01-22 NOTE — Progress Notes (Signed)
   CARE MANAGEMENT NOTE 01/22/2012  Patient:  [name], [name]   Account Number:  [id number]  Date Initiated:  01/17/2012  Documentation initiated by:  GRAVES-BIGELOW,Mckenzie Bove  Subjective/Objective Assessment:   Pt admitted with htn urgency. Pt arrived here from work. Hx of cpap use. Pt is from home with husband.     Action/Plan:   CM did contact [facility] due to pt had sleep study in August of 2012. Sleep study sent to [facility] Derrian Harris.   Anticipated DC Date:  01/18/2012   Anticipated DC Plan:  HOME/SELF CARE      DC Planning Services  CM consult      PAC Choice  DURABLE MEDICAL EQUIPMENT   Choice offered to / List presented to:  C-1 Patient   DME arranged  CPAP  OXYGEN      DME agency  [facility]     [facility] arranged  HH-1 RN  HH-10 DISEASE MANAGEMENT      HH agency  [facility]   Status of service:  Completed, signed off Medicare Important Message given?   (If response is "NO", the following Medicare IM given date fields will be blank) Date Medicare IM given:   Date Additional Medicare IM given:    Discharge Disposition:  HOME W HOME HEALTH SERVICES  Per UR Regulation:  Reviewed for med. necessity/level of care/duration of stay  If discussed at Long Length of Stay Meetings, dates discussed:    Comments:  01-22-12 [address] [place], [place] [phone] CM made referral for serivces listed above. SOC to begin within 24-48 hours post d/c. DME to be delivered to room. Pt to go by [facility] on friday for cpap.   01-21-12 [address], [place] [phone] CM did call Derrian with [facility]. Sleep study was sent to [facility]. Unsure of d/c date at this time. Diuresing at this time. Hopefully plan for d/c in next 24-48 hours. Will make [facility] aware. No need for 02 at this time.   01-17-12 [address] [place], RN,BSN [phone] [facility] will look at pt over the weekend and discuss payment option for dme. Weekend CM will assist with  needs.

## 2012-01-25 ENCOUNTER — Observation Stay (HOSPITAL_COMMUNITY)
Admission: EM | Admit: 2012-01-25 | Discharge: 2012-01-28 | DRG: 568 | Disposition: A | Discharge status: Home / Self Care | Payer: BC Managed Care – PPO | Attending: Internal Medicine | Admitting: Internal Medicine

## 2012-01-25 ENCOUNTER — Emergency Department (HOSPITAL_COMMUNITY): Payer: BC Managed Care – PPO

## 2012-01-25 ENCOUNTER — Encounter (HOSPITAL_COMMUNITY): Payer: Self-pay | Admitting: Family Medicine

## 2012-01-25 DIAGNOSIS — K219 Gastro-esophageal reflux disease without esophagitis: Secondary | ICD-10-CM

## 2012-01-25 DIAGNOSIS — I5031 Acute diastolic (congestive) heart failure: Secondary | ICD-10-CM

## 2012-01-25 DIAGNOSIS — E282 Polycystic ovarian syndrome: Secondary | ICD-10-CM

## 2012-01-25 DIAGNOSIS — D509 Iron deficiency anemia, unspecified: Secondary | ICD-10-CM

## 2012-01-25 DIAGNOSIS — N179 Acute kidney failure, unspecified: Principal | ICD-10-CM

## 2012-01-25 DIAGNOSIS — F3289 Other specified depressive episodes: Secondary | ICD-10-CM

## 2012-01-25 DIAGNOSIS — E782 Mixed hyperlipidemia: Secondary | ICD-10-CM

## 2012-01-25 DIAGNOSIS — G4733 Obstructive sleep apnea (adult) (pediatric): Secondary | ICD-10-CM

## 2012-01-25 DIAGNOSIS — G4700 Insomnia, unspecified: Secondary | ICD-10-CM

## 2012-01-25 DIAGNOSIS — J209 Acute bronchitis, unspecified: Secondary | ICD-10-CM

## 2012-01-25 DIAGNOSIS — K644 Residual hemorrhoidal skin tags: Secondary | ICD-10-CM

## 2012-01-25 DIAGNOSIS — G478 Other sleep disorders: Secondary | ICD-10-CM

## 2012-01-25 DIAGNOSIS — J309 Allergic rhinitis, unspecified: Secondary | ICD-10-CM

## 2012-01-25 DIAGNOSIS — R112 Nausea with vomiting, unspecified: Secondary | ICD-10-CM

## 2012-01-25 DIAGNOSIS — E041 Nontoxic single thyroid nodule: Secondary | ICD-10-CM

## 2012-01-25 DIAGNOSIS — F430 Acute stress reaction: Secondary | ICD-10-CM

## 2012-01-25 DIAGNOSIS — E876 Hypokalemia: Secondary | ICD-10-CM

## 2012-01-25 DIAGNOSIS — I5030 Unspecified diastolic (congestive) heart failure: Secondary | ICD-10-CM

## 2012-01-25 DIAGNOSIS — Z6841 Body Mass Index (BMI) 40.0 and over, adult: Secondary | ICD-10-CM | POA: Insufficient documentation

## 2012-01-25 DIAGNOSIS — I5032 Chronic diastolic (congestive) heart failure: Secondary | ICD-10-CM | POA: Insufficient documentation

## 2012-01-25 DIAGNOSIS — Z79899 Other long term (current) drug therapy: Secondary | ICD-10-CM | POA: Insufficient documentation

## 2012-01-25 DIAGNOSIS — I160 Hypertensive urgency: Secondary | ICD-10-CM

## 2012-01-25 DIAGNOSIS — F419 Anxiety disorder, unspecified: Secondary | ICD-10-CM

## 2012-01-25 DIAGNOSIS — I10 Essential (primary) hypertension: Secondary | ICD-10-CM

## 2012-01-25 DIAGNOSIS — I509 Heart failure, unspecified: Secondary | ICD-10-CM

## 2012-01-25 DIAGNOSIS — R82998 Other abnormal findings in urine: Secondary | ICD-10-CM | POA: Insufficient documentation

## 2012-01-25 DIAGNOSIS — E079 Disorder of thyroid, unspecified: Secondary | ICD-10-CM

## 2012-01-25 DIAGNOSIS — I2789 Other specified pulmonary heart diseases: Secondary | ICD-10-CM | POA: Insufficient documentation

## 2012-01-25 DIAGNOSIS — R197 Diarrhea, unspecified: Secondary | ICD-10-CM

## 2012-01-25 DIAGNOSIS — E871 Hypo-osmolality and hyponatremia: Secondary | ICD-10-CM

## 2012-01-25 DIAGNOSIS — E119 Type 2 diabetes mellitus without complications: Secondary | ICD-10-CM

## 2012-01-25 DIAGNOSIS — J45909 Unspecified asthma, uncomplicated: Secondary | ICD-10-CM

## 2012-01-25 DIAGNOSIS — D72829 Elevated white blood cell count, unspecified: Secondary | ICD-10-CM | POA: Insufficient documentation

## 2012-01-25 DIAGNOSIS — F329 Major depressive disorder, single episode, unspecified: Secondary | ICD-10-CM

## 2012-01-25 DIAGNOSIS — R079 Chest pain, unspecified: Secondary | ICD-10-CM

## 2012-01-25 DIAGNOSIS — K648 Other hemorrhoids: Secondary | ICD-10-CM

## 2012-01-25 DIAGNOSIS — T50995A Adverse effect of other drugs, medicaments and biological substances, initial encounter: Secondary | ICD-10-CM | POA: Insufficient documentation

## 2012-01-25 LAB — BASIC METABOLIC PANEL
BUN: 81 mg/dL — ABNORMAL HIGH (ref 6–23)
Chloride: 85 mEq/L — ABNORMAL LOW (ref 96–112)
Creatinine, Ser: 3.71 mg/dL — ABNORMAL HIGH (ref 0.50–1.10)
GFR calc Af Amer: 16 mL/min — ABNORMAL LOW (ref >90)
Glucose, Bld: 150 mg/dL — ABNORMAL HIGH (ref 70–99)

## 2012-01-25 LAB — URINE MICROSCOPIC-ADD ON

## 2012-01-25 LAB — CBC WITH DIFFERENTIAL/PLATELET
Lymphocytes Relative: 13 % (ref 12–46)
Lymphs Abs: 1.9 10*3/uL (ref 0.7–4.0)
Neutrophils Relative %: 81 % — ABNORMAL HIGH (ref 43–77)
Platelets: 407 10*3/uL — ABNORMAL HIGH (ref 150–400)
RBC: 5.32 MIL/uL — ABNORMAL HIGH (ref 3.87–5.11)
WBC: 14.7 10*3/uL — ABNORMAL HIGH (ref 4.0–10.5)

## 2012-01-25 LAB — URINALYSIS, ROUTINE W REFLEX MICROSCOPIC
Bilirubin Urine: NEGATIVE
Ketones, ur: NEGATIVE mg/dL
Nitrite: NEGATIVE
pH: 5 (ref 5.0–8.0)

## 2012-01-25 LAB — COMPREHENSIVE METABOLIC PANEL
ALT: 48 U/L — ABNORMAL HIGH (ref 0–35)
AST: 31 U/L (ref 0–37)
Alkaline Phosphatase: 102 U/L (ref 39–117)
CO2: 25 mEq/L (ref 19–32)
GFR calc Af Amer: 16 mL/min — ABNORMAL LOW (ref >90)
GFR calc non Af Amer: 14 mL/min — ABNORMAL LOW (ref >90)
Glucose, Bld: 161 mg/dL — ABNORMAL HIGH (ref 70–99)
Potassium: 4.9 mEq/L (ref 3.5–5.1)
Sodium: 129 mEq/L — ABNORMAL LOW (ref 135–145)
Total Protein: 8 g/dL (ref 6.0–8.3)

## 2012-01-25 LAB — PRO B NATRIURETIC PEPTIDE: Pro B Natriuretic peptide (BNP): 151.6 pg/mL — ABNORMAL HIGH (ref 0–125)

## 2012-01-25 LAB — LIPASE, BLOOD: Lipase: 70 U/L — ABNORMAL HIGH (ref 11–59)

## 2012-01-25 MED ORDER — SODIUM CHLORIDE 0.9 % IV SOLN
INTRAVENOUS | Status: DC
  Administered 2012-01-25: 18:00:00 via INTRAVENOUS

## 2012-01-25 MED ORDER — ONDANSETRON HCL 4 MG/2ML IJ SOLN
4.0000 mg | Freq: Four times a day (QID) | INTRAMUSCULAR | Status: DC | PRN
  Administered 2012-01-27: 4 mg via INTRAVENOUS
  Filled 2012-01-25: qty 2

## 2012-01-25 MED ORDER — BUPROPION HCL ER (XL) 150 MG PO TB24
150.0000 mg | ORAL_TABLET | Freq: Every day | ORAL | Status: DC
  Administered 2012-01-26 – 2012-01-28 (×3): 150 mg via ORAL
  Filled 2012-01-25 (×3): qty 1

## 2012-01-25 MED ORDER — ONDANSETRON HCL 4 MG PO TABS
4.0000 mg | ORAL_TABLET | Freq: Four times a day (QID) | ORAL | Status: DC | PRN
  Administered 2012-01-26: 4 mg via ORAL
  Filled 2012-01-25: qty 1

## 2012-01-25 MED ORDER — DOXYCYCLINE HYCLATE 100 MG PO TABS: 100.0000 mg | ORAL_TABLET | Freq: Every day | ORAL | Status: DC

## 2012-01-25 MED ORDER — ONDANSETRON HCL 4 MG/2ML IJ SOLN: 4.0000 mg | Freq: Three times a day (TID) | INTRAMUSCULAR | Status: DC | PRN

## 2012-01-25 MED ORDER — SODIUM CHLORIDE 0.9 % IV SOLN
INTRAVENOUS | Status: DC
  Administered 2012-01-25: via INTRAVENOUS

## 2012-01-25 MED ORDER — SERTRALINE HCL 100 MG PO TABS
100.0000 mg | ORAL_TABLET | Freq: Every day | ORAL | Status: DC
  Administered 2012-01-26 – 2012-01-28 (×3): 100 mg via ORAL
  Filled 2012-01-25 (×3): qty 1

## 2012-01-25 MED ORDER — DOXYCYCLINE HYCLATE 100 MG PO TABS
100.0000 mg | ORAL_TABLET | Freq: Every day | ORAL | Status: DC
  Filled 2012-01-25: qty 1

## 2012-01-25 MED ORDER — PANTOPRAZOLE SODIUM 40 MG PO TBEC
40.0000 mg | DELAYED_RELEASE_TABLET | Freq: Every day | ORAL | Status: DC
  Administered 2012-01-26 – 2012-01-28 (×3): 40 mg via ORAL
  Filled 2012-01-25 (×3): qty 1

## 2012-01-25 MED ORDER — SODIUM CHLORIDE 0.9 % IV SOLN: INTRAVENOUS | Status: DC

## 2012-01-25 MED ORDER — HYDROMORPHONE HCL PF 1 MG/ML IJ SOLN: 1.0000 mg | INTRAMUSCULAR | Status: DC | PRN

## 2012-01-25 MED ORDER — METOCLOPRAMIDE HCL 5 MG/ML IJ SOLN: 10.0000 mg | INTRAMUSCULAR | Status: DC

## 2012-01-25 MED ORDER — HYDROMORPHONE HCL PF 1 MG/ML IJ SOLN
1.0000 mg | Freq: Once | INTRAMUSCULAR | Status: DC
Start: 2012-01-25 — End: 2012-01-25

## 2012-01-25 MED ORDER — SIMVASTATIN 20 MG PO TABS
20.0000 mg | ORAL_TABLET | Freq: Every evening | ORAL | Status: DC
  Administered 2012-01-26 – 2012-01-28 (×3): 20 mg via ORAL
  Filled 2012-01-25 (×3): qty 1

## 2012-01-25 MED ORDER — ONDANSETRON HCL 4 MG/2ML IJ SOLN
4.0000 mg | Freq: Once | INTRAMUSCULAR | Status: AC
  Administered 2012-01-25: 4 mg via INTRAVENOUS
  Filled 2012-01-25: qty 2

## 2012-01-25 MED ORDER — ACETAMINOPHEN 325 MG PO TABS
975.0000 mg | ORAL_TABLET | Freq: Once | ORAL | Status: AC
  Administered 2012-01-25: 975 mg via ORAL
  Filled 2012-01-25: qty 3

## 2012-01-25 NOTE — ED Notes (Signed)
Attempted to collect urine, pt could not void 

## 2012-01-25 NOTE — ED Notes (Signed)
Pt. States that she has been having N/V/D with dizziness and a headache since around 0500 this am. Denies fever or chills. Spoke to Dr. Michae Kava RN around [phone] and was advised to come to ED for evaluation.

## 2012-01-25 NOTE — ED Notes (Signed)
MD at bedside. 

## 2012-01-25 NOTE — ED Provider Notes (Signed)
History     CSN: [id number]  Arrival date & time 01/25/12  1608   First MD Initiated Contact with Patient 01/25/12 1705      Chief Complaint  Patient presents with  . Emesis  . Dizziness  . Nausea    (Consider location/radiation/quality/duration/timing/severity/associated sxs/prior treatment) The history is provided by the patient, medical records and a friend.    [name] is a 46 y.o. female as is the emergency department complaining of nausea, vomiting and diarrhea.  Patient was discharged on Thursday from [facility] after being admitted to [facility] for congestive heart failure. Upon discharge patient was sent home with torsemide and spironolactone.  She states she's had the urge to urinate since yesterday but is only urinating "dribbles".  She states she's not urinating today.  States she awoke at 5 AM with vomiting which has persisted throughout the day.  She spoke with Dr. Milinda Antis, her PCP, who recommended that she be evaluated here in the emergency department.   She has associated lightheadedness and headache. She denies fever or chills, chest pain, shortness of breath, pain, syncopal episode.  Patient radiologist is Bensimhon.  Began gradually yesterday evening, have been persistent, gradually worsening.  Nothing makes it better and nothing makes them worse.  Past Medical History  Diagnosis Date  . Anemia     iron deficiency  . Asthma     a. 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  . Depression     a. Stress reaction 08/2011 in multiple social stressors.  . Diabetes mellitus   . Hypertension   . Morbid obesity   . Hyperlipidemia   . Vertigo   . PCOS (polycystic ovarian syndrome)   . Gastritis   . Insomnia   . Complication of anesthesia     DIFFICULT WAKING  . GERD (gastroesophageal reflux disease)   . OSA (obstructive sleep apnea) 01/17/2012    Past Surgical History  Procedure Date  . Cholecystectomy   . Hemorrhoid surgery    . Tubal ligation   . Shoulder surgery 06/2009  . Tonsillectomy and adenoidectomy CHILD  . Intrauterine device insertion 2011    Family History  Problem Relation Age of Onset  . Cancer Father     colon cancer  . Arthritis Father   . Arthritis Mother   . Asthma Mother   . Hypertension Mother     History  Substance Use Topics  . Smoking status: Former Smoker -- 0.2 packs/day for 5 years    Quit date: 03/25/2004  . Smokeless tobacco: Never Used  . Alcohol Use: No    OB History    Grav Para Term Preterm Abortions TAB SAB Ect Mult Living                  Review of Systems  Constitutional: Negative for fever, diaphoresis, appetite change, fatigue and unexpected weight change.  HENT: Negative for mouth sores, neck pain and neck stiffness.   Eyes: Negative for visual disturbance.  Respiratory: Positive for cough. Negative for chest tightness, shortness of breath and wheezing.   Cardiovascular: Negative for chest pain and leg swelling.  Gastrointestinal: Positive for nausea, vomiting and diarrhea. Negative for abdominal pain and constipation.  Genitourinary: Positive for urgency and decreased urine volume. Negative for dysuria, frequency and hematuria.  Musculoskeletal: Negative for back pain.  Skin: Negative for rash.  Neurological: Positive for headaches. Negative for syncope and light-headedness.  Psychiatric/Behavioral: Negative for disturbed wake/sleep cycle.  The patient is not nervous/anxious.   All other systems reviewed and are negative.    Allergies  Buspirone hcl; Glipizide; and Oxycodone-acetaminophen  Home Medications   Current Outpatient Rx  Name Route Sig Dispense Refill  . ALBUTEROL SULFATE HFA 108 (90 BASE) MCG/ACT IN AERS Inhalation Inhale 2 puffs into the lungs every 4 (four) hours as needed. For wheezing    . BISOPROLOL FUMARATE 5 MG PO TABS Oral Take 1 tablet (5 mg total) by mouth daily. 30 tablet 0  . BUPROPION HCL ER (XL) 150 MG PO TB24 Oral Take  150 mg by mouth daily.    Marland Kitchen CETIRIZINE HCL 10 MG PO TABS Oral Take 10 mg by mouth at bedtime.      Marland Kitchen DOXYCYCLINE HYCLATE 100 MG PO TABS Oral Take 100 mg by mouth daily.    Marland Kitchen FERROUS SULFATE 325 (65 FE) MG PO TABS Oral Take 325 mg by mouth 2 (two) times daily.      Marland Kitchen FLUTICASONE PROPIONATE 50 MCG/ACT NA SUSP Nasal Place 2 sprays into the nose daily. 16 g 0  . GLIMEPIRIDE 4 MG PO TABS Oral Take 4 mg by mouth daily before breakfast.      . INSULIN GLARGINE 100 UNIT/ML  SOLN Subcutaneous Inject 60 Units into the skin at bedtime. 10 mL 11  . METFORMIN HCL 1000 MG PO TABS Oral Take 1 tablet (1,000 mg total) by mouth 2 (two) times daily with a meal. 60 tablet 11  . MULTIVITAMINS PO CAPS Oral Take 1 capsule by mouth at bedtime.     Marland Kitchen FISH OIL PO Oral Take 2 capsules by mouth daily.      Marland Kitchen PANTOPRAZOLE SODIUM 40 MG PO TBEC Oral Take 40 mg by mouth daily.    . SERTRALINE HCL 50 MG PO TABS Oral Take 2 tablets (100 mg total) by mouth daily. 30 tablet 11  . SIMVASTATIN 20 MG PO TABS Oral Take 20 mg by mouth every evening.    Marland Kitchen SPIRONOLACTONE 25 MG PO TABS Oral Take 1 tablet (25 mg total) by mouth daily. 30 tablet 0  . TELMISARTAN 80 MG PO TABS Oral Take 1 tablet (80 mg total) by mouth daily. 30 tablet 11  . TORSEMIDE 20 MG PO TABS Oral Take 2 tablets (40 mg total) by mouth daily. 30 tablet 0  . ZOLPIDEM TARTRATE 10 MG PO TABS Oral Take 0.5-1 tablets (5-10 mg total) by mouth at bedtime as needed. For sleep 30 tablet 3    BP 98/75  Pulse 64  Temp 98 F (36.7 C) (Oral)  Resp 18  SpO2 98%  Physical Exam  Nursing note and vitals reviewed. Constitutional: She is oriented to person, place, and time. She appears well-developed and well-nourished.  HENT:  Head: Normocephalic and atraumatic.  Mouth/Throat: Oropharynx is clear and moist. No oropharyngeal exudate.  Eyes: Conjunctivae normal and EOM are normal. Pupils are equal, round, and reactive to light. No scleral icterus.  Neck: Normal range of  motion. Neck supple.       Goiter palpated  Cardiovascular: Normal rate, regular rhythm, normal heart sounds and intact distal pulses.  Exam reveals no gallop and no friction rub.   No murmur heard.      No peripheral edema  Pulmonary/Chest: Effort normal and breath sounds normal. No respiratory distress. She has no wheezes. She has no rales. She exhibits no tenderness.  Abdominal: Soft. Bowel sounds are normal. She exhibits no mass. There is no tenderness. There is no  rigidity, no rebound, no guarding, no tenderness at McBurney's point and negative Murphy's sign.  Musculoskeletal: Normal range of motion. She exhibits no edema.  Lymphadenopathy:    She has no cervical adenopathy.  Neurological: She is alert and oriented to person, place, and time. She exhibits normal muscle tone. Coordination normal.       Speech is clear and goal oriented Moves extremities without ataxia  Skin: Skin is warm and dry. No rash noted. She is not diaphoretic.  Psychiatric: She has a normal mood and affect.    ED Course  Procedures (including critical care time)  Labs Reviewed  CBC WITH DIFFERENTIAL - Abnormal; Notable for the following:    WBC 14.7 (*)     RBC 5.32 (*)     Platelets 407 (*)     Neutrophils Relative 81 (*)     Neutro Abs 11.9 (*)     All other components within normal limits  COMPREHENSIVE METABOLIC PANEL - Abnormal; Notable for the following:    Sodium 129 (*)     Chloride 86 (*)     Glucose, Bld 161 (*)     BUN 81 (*)     Creatinine, Ser 3.62 (*)     ALT 48 (*)     GFR calc non Af Amer 14 (*)     GFR calc Af Amer 16 (*)     All other components within normal limits  URINALYSIS, ROUTINE W REFLEX MICROSCOPIC - Abnormal; Notable for the following:    APPearance CLOUDY (*)     Hgb urine dipstick LARGE (*)     Leukocytes, UA MODERATE (*)     All other components within normal limits  BASIC METABOLIC PANEL - Abnormal; Notable for the following:    Sodium 127 (*)     Chloride 85  (*)     Glucose, Bld 150 (*)     BUN 81 (*)     Creatinine, Ser 3.71 (*)     GFR calc non Af Amer 14 (*)     GFR calc Af Amer 16 (*)     All other components within normal limits  GLUCOSE, CAPILLARY - Abnormal; Notable for the following:    Glucose-Capillary 147 (*)     All other components within normal limits  URINE MICROSCOPIC-ADD ON - Abnormal; Notable for the following:    Squamous Epithelial / LPF MANY (*)     Bacteria, UA MANY (*)     All other components within normal limits  URINE CULTURE  TROPONIN I  PRO B NATRIURETIC PEPTIDE  LIPASE, BLOOD   Dg Chest 2 View  01/25/2012  *RADIOLOGY REPORT*  Clinical Data: Shortness of breath, nausea.  CHEST - 2 VIEW  Comparison: 01/16/2012  Findings: Cardiomegaly.  No confluent airspace opacities or effusions.  No acute bony abnormality.  IMPRESSION: Cardiomegaly.  No acute findings.   Original Report Authenticated By: Charlett Nose, M.D.    ECG:  Date: 01/25/2012  Rate: 62  Rhythm: normal sinus rhythm and premature ventricular contractions (PVC)  QRS Axis: right  Intervals: normal  ST/T Wave abnormalities: normal  Conduction Disutrbances:none  Narrative Interpretation: NSR with multiple PVCs, non-ischemic ECG  Old EKG Reviewed: unchanged    1. Hyponatremia   2. Nausea and vomiting   3. Acute renal failure       MDM  Franchot Heidelberg presents with N/V/D.  Unlikely exacerbation of CHF or ACS.  Concern the patient is significantly dehydrated secondary to her diuretic  use.  We'll hydrate slowly.  Patient with evidence of acute renal failure, UA with contamination; urine culture sent. Patient with hyponatremia.  Chest x-ray with cardiomegaly but no evidence of opacity or effusion.  Liver function largely unremarkable, ALT elevated at 48.  BUN elevated 81 from 22 and serum creatinine elevated to 3.71 from 0.97.  Mildly elevated lipase at 70. Troponin negative. BNP 151.6.  Will proceed with admission for acute renal failure.          Dahlia Client Verdine Grenfell, PA-C 01/25/12 2229

## 2012-01-25 NOTE — H&P (Signed)
PCP:   Roxy Manns, MD   Chief Complaint:  N/v/d  HPI: 46 yo female just d/c with new dx of chf was placed on multiple new meds improved while in the ed with less sob and less le edema.  Sent home and was doing well and then this am started having nausea with progressive worsening vomiting and diarrhea both nonbloody.  No fevers.  Has not been urinating as much as she was in while in the hosp several days ago.  No abd pain.  No cp.  No sob.  No le edema.  Review of Systems:  O/w neg  Past Medical History: Past Medical History  Diagnosis Date  . Anemia     iron deficiency  . Asthma     a. 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  . Depression     a. Stress reaction 08/2011 in multiple social stressors.  . Diabetes mellitus   . Hypertension   . Morbid obesity   . Hyperlipidemia   . Vertigo   . PCOS (polycystic ovarian syndrome)   . Gastritis   . Insomnia   . Complication of anesthesia     DIFFICULT WAKING  . GERD (gastroesophageal reflux disease)   . OSA (obstructive sleep apnea) 01/17/2012   Past Surgical History  Procedure Date  . Cholecystectomy   . Hemorrhoid surgery   . Tubal ligation   . Shoulder surgery 06/2009  . Tonsillectomy and adenoidectomy CHILD  . Intrauterine device insertion 2011    Medications: Prior to Admission medications   Medication Sig Start Date End Date Taking? Authorizing Provider  albuterol (PROAIR HFA) 108 (90 BASE) MCG/ACT inhaler Inhale 2 puffs into the lungs every 4 (four) hours as needed. For wheezing   Yes Historical Provider, MD  bisoprolol (ZEBETA) 5 MG tablet Take 1 tablet (5 mg total) by mouth daily. 01/22/12  Yes Kathlen Mody, MD  buPROPion (WELLBUTRIN XL) 150 MG 24 hr tablet Take 150 mg by mouth daily.   Yes Historical Provider, MD  cetirizine (ZYRTEC) 10 MG tablet Take 10 mg by mouth at bedtime.     Yes Historical Provider, MD  doxycycline (VIBRA-TABS) 100 MG tablet Take 100 mg by mouth  daily. 01/22/12  Yes Kathlen Mody, MD  ferrous sulfate 325 (65 FE) MG tablet Take 325 mg by mouth 2 (two) times daily.     Yes Historical Provider, MD  fluticasone (FLONASE) 50 MCG/ACT nasal spray Place 2 sprays into the nose daily. 01/22/12  Yes Kathlen Mody, MD  glimepiride (AMARYL) 4 MG tablet Take 4 mg by mouth daily before breakfast.   01/21/11  Yes Judy Pimple, MD  insulin glargine (LANTUS SOLOSTAR) 100 UNIT/ML injection Inject 60 Units into the skin at bedtime. 05/28/11  Yes Judy Pimple, MD  metFORMIN (GLUCOPHAGE) 1000 MG tablet Take 1 tablet (1,000 mg total) by mouth 2 (two) times daily with a meal. 05/28/11  Yes Judy Pimple, MD  Multiple Vitamin (MULTIVITAMIN) capsule Take 1 capsule by mouth at bedtime.    Yes Historical Provider, MD  Omega-3 Fatty Acids (FISH OIL PO) Take 2 capsules by mouth daily.     Yes Historical Provider, MD  pantoprazole (PROTONIX) 40 MG tablet Take 40 mg by mouth daily.   Yes Historical Provider, MD  sertraline (ZOLOFT) 50 MG tablet Take 2 tablets (100 mg total) by mouth daily. 08/30/11  Yes Judy Pimple, MD  simvastatin (ZOCOR) 20 MG tablet Take 20 mg  by mouth every evening.   Yes Historical Provider, MD  spironolactone (ALDACTONE) 25 MG tablet Take 1 tablet (25 mg total) by mouth daily. 01/22/12  Yes Kathlen Mody, MD  telmisartan (MICARDIS) 80 MG tablet Take 1 tablet (80 mg total) by mouth daily. 05/28/11  Yes Judy Pimple, MD  torsemide (DEMADEX) 20 MG tablet Take 2 tablets (40 mg total) by mouth daily. 01/22/12  Yes Kathlen Mody, MD  zolpidem (AMBIEN) 10 MG tablet Take 0.5-1 tablets (5-10 mg total) by mouth at bedtime as needed. For sleep 08/30/11  Yes Judy Pimple, MD    Allergies:   Allergies  Allergen Reactions  . Buspirone Hcl Nausea Only  . Glipizide     REACTION: sleepy, low sugar  . Oxycodone-Acetaminophen Itching    Social History:  reports that she quit smoking about 7 years ago. She has never used smokeless tobacco. She reports that she does  not drink alcohol or use illicit drugs.  Family History: Family History  Problem Relation Age of Onset  . Cancer Father     colon cancer  . Arthritis Father   . Arthritis Mother   . Asthma Mother   . Hypertension Mother     Physical Exam: Filed Vitals:   01/25/12 1615 01/25/12 1744 01/25/12 1926 01/25/12 2146  BP: 102/72 97/64 116/54 98/75  Pulse: 64 63 63 64  Temp:  97.6 F (36.4 C) 98 F (36.7 C)   TempSrc:  Oral Oral   Resp: 18 20 15 18   SpO2: 98% 100% 97% 98%   General appearance: alert, cooperative and no distress Neck: no JVD and supple, symmetrical, trachea midline Lungs: clear to auscultation bilaterally Heart: regular rate and rhythm, S1, S2 normal, no murmur, click, rub or gallop Abdomen: soft, non-tender; bowel sounds normal; no masses,  no organomegaly Extremities: extremities normal, atraumatic, no cyanosis or edema Pulses: 2+ and symmetric Skin: Skin color, texture, turgor normal. No rashes or lesions Neurologic: Grossly normal   Labs on Admission:   [facility] 01/25/12 1813 01/25/12 1630  NA 127* 129*  K 4.6 4.9  CL 85* 86*  CO2 24 25  GLUCOSE 150* 161*  BUN 81* 81*  CREATININE 3.71* 3.62*  CALCIUM 10.3 10.2  MG -- --  PHOS -- --    Basename 01/25/12 1630  AST 31  ALT 48*  ALKPHOS 102  BILITOT 0.4  PROT 8.0  ALBUMIN 4.3    Basename 01/25/12 2120  LIPASE 70*  AMYLASE --    Basename 01/25/12 1630  WBC 14.7*  NEUTROABS 11.9*  HGB 14.9  HCT 44.1  MCV 82.9  PLT 407*    Basename 01/25/12 2119  CKTOTAL --  CKMB --  CKMBINDEX --  TROPONINI <0.30    Radiological Exams on Admission: Dg Chest 2 View  01/25/2012  *RADIOLOGY REPORT*  Clinical Data: Shortness of breath, nausea.  CHEST - 2 VIEW  Comparison: 01/16/2012  Findings: Cardiomegaly.  No confluent airspace opacities or effusions.  No acute bony abnormality.  IMPRESSION: Cardiomegaly.  No acute findings.   Original Report Authenticated By: Charlett Nose, M.D.      Assessment/Plan Present on Admission:  46 yo female with recently dx chf placed on mult new meds now with n/v/d and acute renal failure .Acute renal failure .Nausea & vomiting .Diarrhea .HYPERTENSION .Diastolic CHF .ANEMIA-IRON DEFICIENCY .OBESITY, MORBID .OSA (obstructive sleep apnea) just got cpap at home this past week 01/17/12 .Hyponatremia  arf likely due to overdiuresis unclear if with acute episode of VGE  or if symptoms are due to her renal failure.  Ck cdiff and stool cultures.  Hold all bp meds, diuretics and metformin.  ivf overnight.  Ck abd u/s in am.  Full code.  Tyrik Stetzer A 01/25/2012, 11:29 PM

## 2012-01-26 ENCOUNTER — Inpatient Hospital Stay (HOSPITAL_COMMUNITY): Payer: BC Managed Care – PPO

## 2012-01-26 LAB — COMPREHENSIVE METABOLIC PANEL
CO2: 24 mEq/L (ref 19–32)
Calcium: 8.8 mg/dL (ref 8.4–10.5)
Creatinine, Ser: 4.81 mg/dL — ABNORMAL HIGH (ref 0.50–1.10)
GFR calc Af Amer: 12 mL/min — ABNORMAL LOW (ref >90)
GFR calc non Af Amer: 10 mL/min — ABNORMAL LOW (ref >90)
Glucose, Bld: 178 mg/dL — ABNORMAL HIGH (ref 70–99)
Total Protein: 7.1 g/dL (ref 6.0–8.3)

## 2012-01-26 LAB — CBC
Hemoglobin: 12.8 g/dL (ref 12.0–15.0)
RBC: 4.7 MIL/uL (ref 3.87–5.11)
WBC: 13 10*3/uL — ABNORMAL HIGH (ref 4.0–10.5)

## 2012-01-26 LAB — GLUCOSE, CAPILLARY
Glucose-Capillary: 119 mg/dL — ABNORMAL HIGH (ref 70–99)
Glucose-Capillary: 128 mg/dL — ABNORMAL HIGH (ref 70–99)

## 2012-01-26 LAB — SODIUM, URINE, RANDOM: Sodium, Ur: 18 mEq/L

## 2012-01-26 LAB — IRON AND TIBC: UIBC: 395 ug/dL (ref 125–400)

## 2012-01-26 LAB — CREATININE, URINE, RANDOM: Creatinine, Urine: 142.26 mg/dL

## 2012-01-26 MED ORDER — DOXYCYCLINE HYCLATE 100 MG PO TABS
100.0000 mg | ORAL_TABLET | Freq: Two times a day (BID) | ORAL | Status: DC
  Administered 2012-01-26 – 2012-01-27 (×3): 100 mg via ORAL
  Filled 2012-01-26 (×4): qty 1

## 2012-01-26 MED ORDER — ACETAMINOPHEN 325 MG PO TABS
ORAL_TABLET | ORAL | Status: AC
  Filled 2012-01-26: qty 2

## 2012-01-26 MED ORDER — ACETAMINOPHEN 325 MG PO TABS
650.0000 mg | ORAL_TABLET | Freq: Four times a day (QID) | ORAL | Status: DC | PRN
  Administered 2012-01-26: 650 mg via ORAL
  Filled 2012-01-26: qty 2

## 2012-01-26 MED ORDER — FERROUS SULFATE 325 (65 FE) MG PO TABS
325.0000 mg | ORAL_TABLET | Freq: Two times a day (BID) | ORAL | Status: DC
  Administered 2012-01-26 – 2012-01-28 (×5): 325 mg via ORAL
  Filled 2012-01-26 (×6): qty 1

## 2012-01-26 MED ORDER — INSULIN ASPART 100 UNIT/ML ~~LOC~~ SOLN
0.0000 [IU] | Freq: Every day | SUBCUTANEOUS | Status: DC
  Administered 2012-01-27: 2 [IU] via SUBCUTANEOUS

## 2012-01-26 MED ORDER — INSULIN ASPART 100 UNIT/ML ~~LOC~~ SOLN
0.0000 [IU] | Freq: Three times a day (TID) | SUBCUTANEOUS | Status: DC
  Administered 2012-01-26: 3 [IU] via SUBCUTANEOUS
  Administered 2012-01-27: 4 [IU] via SUBCUTANEOUS
  Administered 2012-01-27: 7 [IU] via SUBCUTANEOUS
  Administered 2012-01-28 (×3): 4 [IU] via SUBCUTANEOUS

## 2012-01-26 MED ORDER — SODIUM CHLORIDE 0.9 % IV SOLN
INTRAVENOUS | Status: DC
  Administered 2012-01-26: 250 mL via INTRAVENOUS

## 2012-01-26 MED ORDER — ZOLPIDEM TARTRATE 5 MG PO TABS
5.0000 mg | ORAL_TABLET | Freq: Every evening | ORAL | Status: DC | PRN
  Administered 2012-01-26 – 2012-01-27 (×2): 5 mg via ORAL
  Filled 2012-01-26: qty 1

## 2012-01-26 NOTE — Progress Notes (Signed)
Patient requested to have CPAP. MD notified. RT notified. CPAP at bedside and only used at bedtime.  Doristine Devoid, RN

## 2012-01-26 NOTE — Progress Notes (Signed)
Patient admitted from the ED.

## 2012-01-26 NOTE — Consult Note (Signed)
Reason for Consult:AKI  Referring Physician: Dr. Beverly Hodges is an 46 y.o. female.  HPI:  46 yr old female with hx asthma since childhoold, OSA, obesity, and hx 8 yr DM, HTN, with recent admit for vol xs, 10/24-10/29.  Had contrasted CTs 2 in 3 day prior to D/c. Had Dx4 on Sat and admitted.  CR 1.1 last admit and 3.6 on admit and 4.25 today.  AT home is on Micardis, Spironolactone and loop diuretic, as well as  Metformen.  Hx 1 UTI, no stones or hematuria, but nocturia x 4.  No FH or renal dz (mother MS, father COPD and colon Ca both alive). Hx PCOS and recent finding of thyroid nodule,not worked up yet.  Hx morbid obesity. No inherited hx of eye, hearing, or ms skel defects. Constitutional: as above, No fevers, chills, chronic appetite issues, xs wgt long term Eyes: negative Ears, nose, mouth, throat, and face: occ HA an takes NSAID 1x/mon Respiratory: negative, asthma, OSA Cardiovascular: recent prob,none since went home Gastrointestinal: as above. No hx hep, but hx GB. No indigestion Genitourinary:as above Integument/breast: negative Musculoskeletal:negative Neurological: negative Endocrine: as above Allergic/Immunologic: buspar, glucotrol, percocet  Past Medical History  Diagnosis Date  . Anemia     iron deficiency  . Asthma     a. 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  . Depression     a. Stress reaction 08/2011 in multiple social stressors.  . Diabetes mellitus   . Hypertension   . Morbid obesity   . Hyperlipidemia   . Vertigo   . PCOS (polycystic ovarian syndrome)   . Gastritis   . Insomnia   . Complication of anesthesia     DIFFICULT WAKING  . GERD (gastroesophageal reflux disease)   . OSA (obstructive sleep apnea) 01/17/2012    Past Surgical History  Procedure Date  . Cholecystectomy   . Hemorrhoid surgery   . Tubal ligation   . Shoulder surgery 06/2009  . Tonsillectomy and adenoidectomy CHILD  . Intrauterine  device insertion 2011    Family History  Problem Relation Age of Onset  . Cancer Father     colon cancer  . Arthritis Father   . Arthritis Mother   . Asthma Mother   . Hypertension Mother     Social History:  reports that she quit smoking about 7 years ago. She has never used smokeless tobacco. She reports that she does not drink alcohol or use illicit drugs.  Allergies:  Allergies  Allergen Reactions  . Buspirone Hcl Nausea Only  . Glipizide     REACTION: sleepy, low sugar  . Oxycodone-Acetaminophen Itching    Medications:  I have reviewed the patient's current medications. Prior to Admission:    Results for orders placed during the hospital encounter of 01/25/12 (from the past 48 hour(s))  CBC WITH DIFFERENTIAL     Status: Abnormal   Collection Time   01/25/12  4:30 PM      Component Value Range Comment   WBC 14.7 (*) 4.0 - 10.5 K/uL    RBC 5.32 (*) 3.87 - 5.11 MIL/uL    Hemoglobin 14.9  12.0 - 15.0 g/dL    HCT 72.5  36.6 - 44.0 %    MCV 82.9  78.0 - 100.0 fL    MCH 28.0  26.0 - 34.0 pg    MCHC 33.8  30.0 - 36.0 g/dL    RDW 34.7  42.5 - 95.6 %  Platelets 407 (*) 150 - 400 K/uL    Neutrophils Relative 81 (*) 43 - 77 %    Neutro Abs 11.9 (*) 1.7 - 7.7 K/uL    Lymphocytes Relative 13  12 - 46 %    Lymphs Abs 1.9  0.7 - 4.0 K/uL    Monocytes Relative 6  3 - 12 %    Monocytes Absolute 0.8  0.1 - 1.0 K/uL    Eosinophils Relative 1  0 - 5 %    Eosinophils Absolute 0.1  0.0 - 0.7 K/uL    Basophils Relative 0  0 - 1 %    Basophils Absolute 0.0  0.0 - 0.1 K/uL   COMPREHENSIVE METABOLIC PANEL     Status: Abnormal   Collection Time   01/25/12  4:30 PM      Component Value Range Comment   Sodium 129 (*) 135 - 145 mEq/L    Potassium 4.9  3.5 - 5.1 mEq/L    Chloride 86 (*) 96 - 112 mEq/L    CO2 25  19 - 32 mEq/L    Glucose, Bld 161 (*) 70 - 99 mg/dL    BUN 81 (*) 6 - 23 mg/dL    Creatinine, Ser 9.56 (*) 0.50 - 1.10 mg/dL    Calcium 21.3  8.4 - 10.5 mg/dL    Total  Protein 8.0  6.0 - 8.3 g/dL    Albumin 4.3  3.5 - 5.2 g/dL    AST 31  0 - 37 U/L    ALT 48 (*) 0 - 35 U/L    Alkaline Phosphatase 102  39 - 117 U/L    Total Bilirubin 0.4  0.3 - 1.2 mg/dL    GFR calc non Af Amer 14 (*) >90 mL/min    GFR calc Af Amer 16 (*) >90 mL/min   BASIC METABOLIC PANEL     Status: Abnormal   Collection Time   01/25/12  6:13 PM      Component Value Range Comment   Sodium 127 (*) 135 - 145 mEq/L    Potassium 4.6  3.5 - 5.1 mEq/L    Chloride 85 (*) 96 - 112 mEq/L    CO2 24  19 - 32 mEq/L    Glucose, Bld 150 (*) 70 - 99 mg/dL    BUN 81 (*) 6 - 23 mg/dL    Creatinine, Ser 0.86 (*) 0.50 - 1.10 mg/dL    Calcium 57.8  8.4 - 10.5 mg/dL    GFR calc non Af Amer 14 (*) >90 mL/min    GFR calc Af Amer 16 (*) >90 mL/min   GLUCOSE, CAPILLARY     Status: Abnormal   Collection Time   01/25/12  6:47 PM      Component Value Range Comment   Glucose-Capillary 147 (*) 70 - 99 mg/dL   URINALYSIS, ROUTINE W REFLEX MICROSCOPIC     Status: Abnormal   Collection Time   01/25/12  7:09 PM      Component Value Range Comment   Color, Urine YELLOW  YELLOW    APPearance CLOUDY (*) CLEAR    Specific Gravity, Urine 1.013  1.005 - 1.030    pH 5.0  5.0 - 8.0    Glucose, UA NEGATIVE  NEGATIVE mg/dL    Hgb urine dipstick LARGE (*) NEGATIVE    Bilirubin Urine NEGATIVE  NEGATIVE    Ketones, ur NEGATIVE  NEGATIVE mg/dL    Protein, ur NEGATIVE  NEGATIVE mg/dL  Urobilinogen, UA 0.2  0.0 - 1.0 mg/dL    Nitrite NEGATIVE  NEGATIVE    Leukocytes, UA MODERATE (*) NEGATIVE   URINE MICROSCOPIC-ADD ON     Status: Abnormal   Collection Time   01/25/12  7:09 PM      Component Value Range Comment   Squamous Epithelial / LPF MANY (*) RARE    WBC, UA 11-20  <3 WBC/hpf    RBC / HPF 3-6  <3 RBC/hpf    Bacteria, UA MANY (*) RARE   TROPONIN I     Status: Normal   Collection Time   01/25/12  9:19 PM      Component Value Range Comment   Troponin I <0.30  <0.30 ng/mL   PRO B NATRIURETIC PEPTIDE     Status:  Abnormal   Collection Time   01/25/12  9:19 PM      Component Value Range Comment   Pro B Natriuretic peptide (BNP) 151.6 (*) 0 - 125 pg/mL   LIPASE, BLOOD     Status: Abnormal   Collection Time   01/25/12  9:20 PM      Component Value Range Comment   Lipase 70 (*) 11 - 59 U/L   COMPREHENSIVE METABOLIC PANEL     Status: Abnormal   Collection Time   01/26/12  6:19 AM      Component Value Range Comment   Sodium 128 (*) 135 - 145 mEq/L    Potassium 4.2  3.5 - 5.1 mEq/L    Chloride 88 (*) 96 - 112 mEq/L    CO2 24  19 - 32 mEq/L    Glucose, Bld 178 (*) 70 - 99 mg/dL    BUN 90 (*) 6 - 23 mg/dL    Creatinine, Ser 1.61 (*) 0.50 - 1.10 mg/dL    Calcium 8.8  8.4 - 09.6 mg/dL    Total Protein 7.1  6.0 - 8.3 g/dL    Albumin 3.7  3.5 - 5.2 g/dL    AST 25  0 - 37 U/L    ALT 39 (*) 0 - 35 U/L    Alkaline Phosphatase 78  39 - 117 U/L    Total Bilirubin 0.3  0.3 - 1.2 mg/dL    GFR calc non Af Amer 10 (*) >90 mL/min    GFR calc Af Amer 12 (*) >90 mL/min   CBC     Status: Abnormal   Collection Time   01/26/12  6:19 AM      Component Value Range Comment   WBC 13.0 (*) 4.0 - 10.5 K/uL    RBC 4.70  3.87 - 5.11 MIL/uL    Hemoglobin 12.8  12.0 - 15.0 g/dL    HCT 04.5  40.9 - 81.1 %    MCV 83.6  78.0 - 100.0 fL    MCH 27.2  26.0 - 34.0 pg    MCHC 32.6  30.0 - 36.0 g/dL    RDW 91.4  78.2 - 95.6 %    Platelets 350  150 - 400 K/uL   CK     Status: Abnormal   Collection Time   01/26/12  9:15 AM      Component Value Range Comment   Total CK 340 (*) 7 - 177 U/L     Dg Chest 2 View  01/25/2012  *RADIOLOGY REPORT*  Clinical Data: Shortness of breath, nausea.  CHEST - 2 VIEW  Comparison: 01/16/2012  Findings: Cardiomegaly.  No confluent airspace opacities or effusions.  No acute bony abnormality.  IMPRESSION: Cardiomegaly.  No acute findings.   Original Report Authenticated By: Charlett Nose, M.D.     @ROS @ Blood pressure 110/53, pulse 60, temperature 97.5 F (36.4 C), temperature source Oral, resp.  rate 18, height 5\' 5"  (1.651 m), weight 126.826 kg (279 lb 9.6 oz), SpO2 99.00%. @PHYSEXAMBYAGE2 @ Physical Examination: General appearance - pale, obese Mental status - alert, oriented to person, place, and time Eyes - pupils equal and reactive, extraocular eye movements intact, funduscopic exam normal, discs flat and sharp Mouth - mucous membranes moist, pharynx normal without lesions Neck - adenopathy noted PCL, thyroid enlarged Lymphatics - posterior cervical nodes Chest - wheezing noted bilat, soft, decreased air entry noted bilat Heart - S1 and S2 normal, systolic murmur Gr 2/6 at apex Abdomen - obese, GB scar, pos bs, nontender, liver down 4 cm Musculoskeletal - no joint tenderness, deformity or swelling Extremities - pedal edema 1 + Skin - pale  Assessment/Plan: 1 AKI secondary to contrast as primary etiology.  Risk factors DM, recent extensive diuresis.  Also on ARB and had diarrhea and diruesis which are playing role.  Has pyuria and C&S P.  Consider AIN if culture neg.  No systemic eos, will follow.  GFR low and should bounce back.  Vol/K/acid base ok.  U/s P 2 Obesity need extensive counseling, ? Bariatrics 3 Hypertension:  Not an issue, do not restart ARB for 2 weeks but should be restarted 4. DM needs good control 5. PCOS 6 Thyroid nodule per primary P lower ivf, U/S, urine eos, diet , dose adjust meds, avoid Metformen , ARB in 2 weeks.  [name] L 01/26/2012, 10:56 AM

## 2012-01-26 NOTE — Progress Notes (Signed)
[facility] PROGRESS NOTE  [name] OZH:[id number] DOB: 10/10/1965 DOA: 01/25/2012 PCP: Roxy Manns, MD  Assessment/Plan: Acute kidney injury -Likely a combination of volume depletion and IV contrast nephropathy -Patient had CT of the neck with contrast on 01/21/2012 -Patient also had CT angiogram of the chest on 01/16/2012. -Patient has been having diarrhea for the past 2 days, improving -Abdominal ultrasound has been ordered--pending -Consult nephrology -Check CPK -Hold torsemide ,Aldactone, and Micardis -Judicious IV fluids with recent history of decompensated CHF Chronic diastolic CHF -Patient is well compensated at this time -Echocardiogram on 01/16/2012 shows ejection fraction 50-55% -hold bisoprolol for now due to relatively low BP Diarrhea -Clostridium difficile PCR is pending -Continue contact isolation for now Pyuria -Followup urine culture -May be due to other intrinsic renal issue resulting in AKI Diabetes mellitus type 2 -Discontinue metformin for now -Decrease maintenance Lantus dose as increased half-life secondary to AKI -NovoLog sliding scale Obstructive sleep apnea with pulmonary arterial hypertension -Continue nighttime CPAP Iron deficiency anemia -Hemoglobin is at baseline -Continue supplemental iron Leukocytosis -Followup urine culture and stool cultures -Patient afebrile and hemodynamically stable -Patient was recently on a prednisone taper    Disposition Plan:   Home when medically stable     Procedures/Studies: Dg Chest 2 View  01/25/2012  *RADIOLOGY REPORT*  Clinical Data: Shortness of breath, nausea.  CHEST - 2 VIEW  Comparison: 01/16/2012  Findings: Cardiomegaly.  No confluent airspace opacities or effusions.  No acute bony abnormality.  IMPRESSION: Cardiomegaly.  No acute findings.   Original Report Authenticated By: Charlett Nose, M.D.    Ct Soft Tissue Neck W Contrast  01/21/2012  *RADIOLOGY REPORT*  Clinical Data:  46 year old female with goiter and tracheal compression.  CT NECK WITH CONTRAST  Technique:  Multidetector CT imaging of the neck was performed with intravenous contrast.  Contrast: 80mL OMNIPAQUE IOHEXOL 300 MG/ML  SOLN  Comparison: Chest CTA 01/16/2012.  Paranasal sinus CT 09/07/2010.  Findings: Large body habitus.  Mild respiratory motion artifact through the upper lungs which otherwise appear normal.  Left greater than right thyromegaly. In general, the gland encompasses 46 x 68 x 55 mm (AP by transverse by CC).  Mild compression and rightward deviation of the trachea at the level of the enlarged left thyroid lobe.  The degree of tracheal narrowing does not appear significant, and cross-sectional area of the tracheal is reduced by 30%.  Otherwise the visualized major airways are patent.  Within the enlarged left lobe, there is only a solitary rim calcified 11-12 mm nodule evident.  No discrete nodule evident in the right lobe.  No lymphadenopathy at level IV or at the thoracic inlet.  Larynx is within normal limits.  Retropharyngeal course of the bilateral carotid arteries results in mild effacement of the posterior pharynx.  Pharynx otherwise within normal limits. Retropharyngeal space otherwise within normal limits. Parapharyngeal and sublingual spaces within normal limits. Submandibular and parotid glands within normal limits.  No cervical lymphadenopathy.  Visualized major vascular structures are patent.  Visualized paranasal sinuses and mastoids are clear.  No acute osseous abnormality identified.  Negative visualized brain parenchyma. Visualized orbit soft tissues are within normal limits.  IMPRESSION: 1.  Generalized thyromegaly.  Mild rightward deviation and compression of the trachea, but no significant airway narrowing (decreased tracheal cross-sectional area of 30%). 2.  12 mm partially calcified left thyroid lobe nodule.  Consider further evaluation with thyroid ultrasound.  The patient is clinically  hyperthyroid, consider nuclear medicine thyroid scan. 3.  Otherwise no  acute findings in the neck.   Original Report Authenticated By: Harley Hallmark, M.D.    Ct Angio Chest Pe W/cm &/or Wo Cm  01/16/2012  *RADIOLOGY REPORT*  Clinical Data: Chest pain, shortness of breath, elevated D-dimer, evaluate for pulmonary embolism  CT ANGIOGRAPHY CHEST  Technique:  Multidetector CT imaging of the chest using the standard protocol during bolus administration of intravenous contrast. Multiplanar reconstructed images including MIPs were obtained and reviewed to evaluate the vascular anatomy.  Contrast: 80mL OMNIPAQUE IOHEXOL 350 MG/ML SOLN  Comparison: Chest CT - 10/12/2004  Vascular Findings:  There is suboptimal opacification of the pulmonary arterial system of the main pulmonary artery measuring 161 HU.  There are no discrete filling defects within the central pulmonary arterial system to the level of the bilateral segmental pulmonary arteries. There is marked enlargement of the main caliber the main pulmonary artery, measuring approximately 4.2 cm in greatest transverse axial dimension, unchanged.  Cardiomegaly.  No pericardial effusion.  Normal caliber of the thoracic aorta.  Bovine configuration of the aortic arch.  No definite periaortic stranding.  Nonvascular findings:  Evaluation of the pulmonary parenchyma is degraded secondary to patient body habitus and respiratory artifact.  Linear heterogeneous opacities within the left lower lobe and geographic area of peripheral consolidation within the right lower lobe (image 36) are favored to represent atelectasis. Scattered areas of ground-glass lucency are nonspecific but may be seen in the setting of airways disease.  There is minimal collapse of the distal aspect of the trachea and bilateral main pulmonary bronchi, nonspecific but may be seen in setting of tracheomalacia. The central airways remain patent.  No pleural effusion or pneumothorax.  Shoddy mediastinal  lymph nodes are not enlarged by CT criteria with index precarinal node measuring 1 cm in short axis diameter (image 27, series 4 and index prevascular lymph node measuring 6 mm in short axis diameter (image 21).  No definite hilar or axillary lymphadenopathy.  Incidental imaging of the upper abdomen is normal.  There is an apparent interval enlargement of the left lobe of the thyroid, now with mass effect and rightward deviation of the tracheal air column.  This finding without a discrete thyroid nodule.  No acute or aggressive osseous abnormalities.  IMPRESSION:  1.  Degraded examination secondary to patient body habitus, respiratory artifact and suboptimal opacification of the pulmonary arterial system.  2.  No evidence of central pulmonary embolism.  If clinical concern persists for pulmonary embolism, further evaluation with VQ scan or bilateral lower extremity venous Doppler ultrasound may be performed as clinically indicated. 3.  Cardiomegaly and enlargement of the pulmonary arteries suggestive of pulmonary arterial hypertension.  Further evaluation with cardiac echo may be performed as clinically indicated. 4.  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  Further evaluation with PFTs and/or referral to Pulmonology may be performed as clinically indicated.  5.  Apparent enlargement of the left lobe of the thyroid with mass effect upon the trachea.  Further evaluation with dedicated thyroid ultrasound in the nonemergent setting is recommended.   Original Report Authenticated By: Waynard Reeds, M.D.    Dg Chest Portable 1 View  01/16/2012  *RADIOLOGY REPORT*  Clinical Data: Chest pressure.  Shortness of breath.  PORTABLE CHEST - 1 VIEW  Comparison: PA and lateral chest 08/08/2010.  Findings: There is cardiomegaly and pulmonary vascular congestion. No consolidative process, pneumothorax or effusion is identified.  IMPRESSION: No acute finding.  Cardiomegaly and pulmonary  vascular congestion.  Original Report Authenticated By: Bernadene Bell. Maricela Curet, M.D.          Subjective: Patient is field the blood this morning. No more vomiting. She still has nausea. Still having loose stools. 4 bowel movements the last 24 hours. Complaints of hematochezia. Patient states that she has chronic hematochezia secondary to hemorrhoids with a history of hemorrhoidectomy. Denies any abdominal pain, dysuria, hematuria. Denies fevers, chills, chest discomfort, shortness of breath, dizziness, headache, cough, hemoptysis, epistaxis, rashes.  Objective: Filed Vitals:   01/25/12 1926 01/25/12 2146 01/25/12 2340 01/26/12 0512  BP: 116/54 98/75 108/59 110/53  Pulse: 63 64 66 60  Temp: 98 F (36.7 C)  97.8 F (36.6 C) 97.5 F (36.4 C)  TempSrc: Oral  Axillary Oral  Resp: 15 18 18 18   Height:   5\' 5"  (1.651 m)   Weight:   126.826 kg (279 lb 9.6 oz)   SpO2: 97% 98% 99% 99%    Intake/Output Summary (Last 24 hours) at 01/26/12 0917 Last data filed at 01/26/12 0600  Gross per 24 hour  Intake 621.67 ml  Output    226 ml  Net 395.67 ml   Weight change:  Exam:   General:  Pt is alert, follows commands appropriately, not in acute distress  HEENT: No icterus, No thrush,  [place]/AT  Cardiovascular: RRR, S1/S2, no rubs, no gallops  Respiratory: CTA bilaterally, no wheezing, no crackles, no rhonchi  Abdomen: Soft/+BS, non tender, non distended, no guarding  Extremities: No edema, No lymphangitis, No petechiae, No rashes, no synovitis  Data Reviewed: Basic Metabolic Panel:  Lab 01/26/12 1610 01/25/12 1813 01/25/12 1630 01/22/12 0654 01/21/12 0510  NA 128* 127* 129* 132* 136  K 4.2 4.6 4.9 4.2 4.1  CL 88* 85* 86* 90* 93*  CO2 24 24 25  35* 34*  GLUCOSE 178* 150* 161* 149* 138*  BUN 90* 81* 81* 22 18  CREATININE 4.81* 3.71* 3.62* 0.97 0.79  CALCIUM 8.8 10.3 10.2 10.2 10.1  MG -- -- -- -- --  PHOS -- -- -- -- --   Liver Function Tests:  Lab 01/26/12 0619 01/25/12 1630    AST 25 31  ALT 39* 48*  ALKPHOS 78 102  BILITOT 0.3 0.4  PROT 7.1 8.0  ALBUMIN 3.7 4.3    Lab 01/25/12 2120  LIPASE 70*  AMYLASE --   No results found for this basename: AMMONIA:5 in the last 168 hours CBC:  Lab 01/26/12 0619 01/25/12 1630  WBC 13.0* 14.7*  NEUTROABS -- 11.9*  HGB 12.8 14.9  HCT 39.3 44.1  MCV 83.6 82.9  PLT 350 407*   Cardiac Enzymes:  Lab 01/25/12 2119  CKTOTAL --  CKMB --  CKMBINDEX --  TROPONINI <0.30   BNP: No components found with this basename: POCBNP:5 CBG:  Lab 01/25/12 1847 01/22/12 1708 01/22/12 1132 01/22/12 0740 01/21/12 2031  GLUCAP 147* 196* 192* 150* 347*    No results found for this or any previous visit (from the past 240 hour(s)).   Scheduled Meds:   . [COMPLETED] acetaminophen      . [COMPLETED] acetaminophen  975 mg Oral Once  . buPROPion  150 mg Oral Daily  . doxycycline  100 mg Oral Daily  . [COMPLETED] ondansetron (ZOFRAN) IV  4 mg Intravenous Once  . [COMPLETED] ondansetron (ZOFRAN) IV  4 mg Intravenous Once  . pantoprazole  40 mg Oral Daily  . sertraline  100 mg Oral Daily  . simvastatin  20 mg Oral QPM  . [DISCONTINUED]  sodium chloride   Intravenous STAT  . [DISCONTINUED] doxycycline  100 mg Oral Daily  . [DISCONTINUED]  HYDROmorphone (DILAUDID) injection  1 mg Intravenous Once  . [DISCONTINUED] metoCLOPramide (REGLAN) injection  10 mg Intravenous STAT   Continuous Infusions:   . sodium chloride 100 mL/hr at 01/25/12 2347  . [DISCONTINUED] sodium chloride 125 mL/hr at 01/25/12 1811     Myles Tavella, DO  [facility] Pager [phone]  If 7PM-7AM, please contact night-coverage [email or web address] Password [email or web address] 01/26/2012, 9:17 AM   LOS: 1 day

## 2012-01-27 ENCOUNTER — Ambulatory Visit: Payer: BC Managed Care – PPO | Admitting: Emergency Medicine

## 2012-01-27 ENCOUNTER — Telehealth: Payer: Self-pay | Admitting: Family Medicine

## 2012-01-27 DIAGNOSIS — R112 Nausea with vomiting, unspecified: Secondary | ICD-10-CM

## 2012-01-27 DIAGNOSIS — I5030 Unspecified diastolic (congestive) heart failure: Secondary | ICD-10-CM

## 2012-01-27 LAB — CBC WITH DIFFERENTIAL/PLATELET
Basophils Absolute: 0 10*3/uL (ref 0.0–0.1)
Eosinophils Relative: 2 % (ref 0–5)
Lymphocytes Relative: 22 % (ref 12–46)
MCV: 83.9 fL (ref 78.0–100.0)
Neutro Abs: 7.9 10*3/uL — ABNORMAL HIGH (ref 1.7–7.7)
Platelets: 321 10*3/uL (ref 150–400)
RDW: 15.3 % (ref 11.5–15.5)
WBC: 11.6 10*3/uL — ABNORMAL HIGH (ref 4.0–10.5)

## 2012-01-27 LAB — COMPREHENSIVE METABOLIC PANEL
ALT: 48 U/L — ABNORMAL HIGH (ref 0–35)
AST: 33 U/L (ref 0–37)
CO2: 25 mEq/L (ref 19–32)
Calcium: 9.1 mg/dL (ref 8.4–10.5)
GFR calc non Af Amer: 17 mL/min — ABNORMAL LOW (ref >90)
Sodium: 134 mEq/L — ABNORMAL LOW (ref 135–145)

## 2012-01-27 LAB — GLUCOSE, CAPILLARY
Glucose-Capillary: 202 mg/dL — ABNORMAL HIGH (ref 70–99)
Glucose-Capillary: 218 mg/dL — ABNORMAL HIGH (ref 70–99)

## 2012-01-27 MED ORDER — SODIUM CHLORIDE 0.9 % IV SOLN
INTRAVENOUS | Status: DC
  Administered 2012-01-27 – 2012-01-28 (×2): via INTRAVENOUS

## 2012-01-27 MED ORDER — PNEUMOCOCCAL VAC POLYVALENT 25 MCG/0.5ML IJ INJ
0.5000 mL | INJECTION | INTRAMUSCULAR | Status: DC
  Filled 2012-01-27: qty 0.5

## 2012-01-27 NOTE — Telephone Encounter (Signed)
[facility] Triage Call Report Triage Record Num: [id number] Operator: Karenann Cai Patient Name: [name] Call Date & Time: 01/25/2012 2:35:37PM Patient Phone: [phone] PCP: Idamae Schuller A. Tower Patient Gender: Female PCP Fax : Patient DOB: [birth year] Practice Name: [facility] Reason for Call: Caller: Ottie/Patient; PCP: Roxy Manns [facility]); CB#: [phone]; patient is calling due to nausea/vomiting. Patient discharged from [facility] on 01/22/2012: CHF. Onset of nausea: evening of 01/24/2012. Other symptoms: headache and nausea. Afebrile. Diet: low sodium heart healthy. In the hospital: they "pulled off 25 lbs of fluid". Patient has vomited x 3 today. Sputum: greenish yellow. LMP: IUD. Headache pain: scale 0-10: patient rates headache pain as 5. Other symptoms: dizziness: like vertigo. Patient has appointment with Lung MD on 01/27/2012, Heart MD on 01/28/2012 and no follow up yet with PCP. Blood sugar on 01/25/12=143 this morning. Patient reports she "only dribbles" when she urinates: onset 01/25/2012. Patient vomited during phone call. Patient advised to have another adult take her to [facility] ER for evaluation and if transportation not available to call EMS for transport. Protocol(s) Used: Diabetes: Gastrointestinal Problems Protocol(s) Used: Nausea or Vomiting Recommended Outcome per Protocol: See ED Immediately Reason for Outcome: Known diabetic Signs of dehydration Care Advice: ~ Another adult should drive. 01/25/2012 3:05:41PM Page 1 of 1 [email or web address]

## 2012-01-27 NOTE — Progress Notes (Signed)
[facility] PROGRESS NOTE  [name] ZOX:[id number] DOB: 05-12-1965 DOA: 01/25/2012 PCP: Roxy Manns, MD  Assessment/Plan: Acute kidney injury  -Likely a combination of volume depletion and IV contrast nephropathy  -Patient had CT of the neck with contrast on 01/21/2012  -Patient also had CT angiogram of the chest on 01/16/2012.  -Patient has been having diarrhea for the past 2 days-->improved -Abdominal ultrasound has been ordered--no hydronephrosis -appreciate nephrology  -Hold Zocor due to elevated CPK -Hold torsemide ,Aldactone, and Micardis  -Judicious IV fluids with recent history of decompensated CHF  Chronic diastolic CHF  -Patient is well compensated at this time  -Echocardiogram on 01/16/2012 shows ejection fraction 50-55%  -hold bisoprolol for now due to relatively low BP  Diarrhea  -Clostridium difficile PCR negative, stool culture negative -Diarrhea improved -Discontinue doxycycline Pyuria  -Urine culture with only 15,000 colonies gram-negative rods -May be due to other intrinsic renal issue resulting in AKI  Diabetes mellitus type 2  -Discontinue metformin for now  -Decrease maintenance Lantus dose as increased half-life secondary to AKI  -NovoLog sliding scale  Obstructive sleep apnea with pulmonary arterial hypertension  -Continue nighttime CPAP  Iron deficiency anemia  -Hemoglobin is at baseline  -Continue supplemental iron  Leukocytosis  -Followup urine culture and stool cultures  -Patient afebrile and hemodynamically stable  -Patient was recently on a prednisone taper    Disposition Plan: Home when medically stable       Procedures/Studies: Dg Chest 2 View  01/25/2012  *RADIOLOGY REPORT*  Clinical Data: Shortness of breath, nausea.  CHEST - 2 VIEW  Comparison: 01/16/2012  Findings: Cardiomegaly.  No confluent airspace opacities or effusions.  No acute bony abnormality.  IMPRESSION: Cardiomegaly.  No acute findings.   Original Report  Authenticated By: Charlett Nose, M.D.    Ct Soft Tissue Neck W Contrast  01/21/2012  *RADIOLOGY REPORT*  Clinical Data: 46 year old female with goiter and tracheal compression.  CT NECK WITH CONTRAST  Technique:  Multidetector CT imaging of the neck was performed with intravenous contrast.  Contrast: 80mL OMNIPAQUE IOHEXOL 300 MG/ML  SOLN  Comparison: Chest CTA 01/16/2012.  Paranasal sinus CT 09/07/2010.  Findings: Large body habitus.  Mild respiratory motion artifact through the upper lungs which otherwise appear normal.  Left greater than right thyromegaly. In general, the gland encompasses 46 x 68 x 55 mm (AP by transverse by CC).  Mild compression and rightward deviation of the trachea at the level of the enlarged left thyroid lobe.  The degree of tracheal narrowing does not appear significant, and cross-sectional area of the tracheal is reduced by 30%.  Otherwise the visualized major airways are patent.  Within the enlarged left lobe, there is only a solitary rim calcified 11-12 mm nodule evident.  No discrete nodule evident in the right lobe.  No lymphadenopathy at level IV or at the thoracic inlet.  Larynx is within normal limits.  Retropharyngeal course of the bilateral carotid arteries results in mild effacement of the posterior pharynx.  Pharynx otherwise within normal limits. Retropharyngeal space otherwise within normal limits. Parapharyngeal and sublingual spaces within normal limits. Submandibular and parotid glands within normal limits.  No cervical lymphadenopathy.  Visualized major vascular structures are patent.  Visualized paranasal sinuses and mastoids are clear.  No acute osseous abnormality identified.  Negative visualized brain parenchyma. Visualized orbit soft tissues are within normal limits.  IMPRESSION: 1.  Generalized thyromegaly.  Mild rightward deviation and compression of the trachea, but no significant airway narrowing (decreased tracheal cross-sectional  area of 30%). 2.  12 mm  partially calcified left thyroid lobe nodule.  Consider further evaluation with thyroid ultrasound.  The patient is clinically hyperthyroid, consider nuclear medicine thyroid scan. 3.  Otherwise no acute findings in the neck.   Original Report Authenticated By: Harley Hallmark, M.D.    Ct Angio Chest Pe W/cm &/or Wo Cm  01/16/2012  *RADIOLOGY REPORT*  Clinical Data: Chest pain, shortness of breath, elevated D-dimer, evaluate for pulmonary embolism  CT ANGIOGRAPHY CHEST  Technique:  Multidetector CT imaging of the chest using the standard protocol during bolus administration of intravenous contrast. Multiplanar reconstructed images including MIPs were obtained and reviewed to evaluate the vascular anatomy.  Contrast: 80mL OMNIPAQUE IOHEXOL 350 MG/ML SOLN  Comparison: Chest CT - 10/12/2004  Vascular Findings:  There is suboptimal opacification of the pulmonary arterial system of the main pulmonary artery measuring 161 HU.  There are no discrete filling defects within the central pulmonary arterial system to the level of the bilateral segmental pulmonary arteries. There is marked enlargement of the main caliber the main pulmonary artery, measuring approximately 4.2 cm in greatest transverse axial dimension, unchanged.  Cardiomegaly.  No pericardial effusion.  Normal caliber of the thoracic aorta.  Bovine configuration of the aortic arch.  No definite periaortic stranding.  Nonvascular findings:  Evaluation of the pulmonary parenchyma is degraded secondary to patient body habitus and respiratory artifact.  Linear heterogeneous opacities within the left lower lobe and geographic area of peripheral consolidation within the right lower lobe (image 36) are favored to represent atelectasis. Scattered areas of ground-glass lucency are nonspecific but may be seen in the setting of airways disease.  There is minimal collapse of the distal aspect of the trachea and bilateral main pulmonary bronchi, nonspecific but may be seen  in setting of tracheomalacia. The central airways remain patent.  No pleural effusion or pneumothorax.  Shoddy mediastinal lymph nodes are not enlarged by CT criteria with index precarinal node measuring 1 cm in short axis diameter (image 27, series 4 and index prevascular lymph node measuring 6 mm in short axis diameter (image 21).  No definite hilar or axillary lymphadenopathy.  Incidental imaging of the upper abdomen is normal.  There is an apparent interval enlargement of the left lobe of the thyroid, now with mass effect and rightward deviation of the tracheal air column.  This finding without a discrete thyroid nodule.  No acute or aggressive osseous abnormalities.  IMPRESSION:  1.  Degraded examination secondary to patient body habitus, respiratory artifact and suboptimal opacification of the pulmonary arterial system.  2.  No evidence of central pulmonary embolism.  If clinical concern persists for pulmonary embolism, further evaluation with VQ scan or bilateral lower extremity venous Doppler ultrasound may be performed as clinically indicated. 3.  Cardiomegaly and enlargement of the pulmonary arteries suggestive of pulmonary arterial hypertension.  Further evaluation with cardiac echo may be performed as clinically indicated. 4.  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  Further evaluation with PFTs and/or referral to Pulmonology may be performed as clinically indicated.  5.  Apparent enlargement of the left lobe of the thyroid with mass effect upon the trachea.  Further evaluation with dedicated thyroid ultrasound in the nonemergent setting is recommended.   Original Report Authenticated By: Waynard Reeds, M.D.    US Abdomen Complete  01/26/2012  *RADIOLOGY REPORT*  Clinical Data:  Acute renal failure.  Nausea and vomiting.  History of cholecystectomy.  COMPLETE ABDOMINAL  ULTRASOUND  Comparison:  None.  Findings:  Gallbladder:  Surgically absent.  Common bile duct:   Within normal limits in caliber status post cholecystectomy measuring a maximum of 7 mm.  Liver:  Severe and diffuse fatty infiltration of the liver with marked increased echogenicity, poor definition of the liver architecture and poor through transmission.  No focal hepatic lesions or intrahepatic biliary dilatation.  IVC:  Normal.  Pancreas:  Normal.  Spleen:  Normal size and echogenicity without focal lesions.  Right Kidney:  13.4 cm in length. Normal renal cortical thickness and echogenicity without focal lesions or hydronephrosis.  Left Kidney:  14.9 cm in length. Normal renal cortical thickness and echogenicity without focal lesions or hydronephrosis.  Abdominal aorta:  Normal caliber.  IMPRESSION:  1.  Status post cholecystectomy with normal caliber common bile duct. 2.  Diffuse and fairly marked fatty infiltration of the liver. 3.  Unremarkable sonographic appearance of the pancreas, spleen and both kidneys.   Original Report Authenticated By: Rudie Meyer, M.D.    Dg Chest Portable 1 View  01/16/2012  *RADIOLOGY REPORT*  Clinical Data: Chest pressure.  Shortness of breath.  PORTABLE CHEST - 1 VIEW  Comparison: PA and lateral chest 08/08/2010.  Findings: There is cardiomegaly and pulmonary vascular congestion. No consolidative process, pneumothorax or effusion is identified.  IMPRESSION: No acute finding.  Cardiomegaly and pulmonary vascular congestion.   Original Report Authenticated By: Bernadene Bell. Maricela Curet, M.D.          Subjective: Patient is feeling better today. She denies any chest pain, shortness breath, vomiting, abdominal pain, diarrhea, dysuria, hematuria, rashes. She still has some nausea. She states her diarrhea has improved. She wants to eat solid food.  Objective: Filed Vitals:   01/26/12 2155 01/27/12 0519 01/27/12 0942 01/27/12 1406  BP: 109/62 114/47 105/73 99/38  Pulse: 64 67 63 63  Temp: 97.3 F (36.3 C) 97.9 F (36.6 C) 97.7 F (36.5 C) 98.6 F (37 C)  TempSrc:  Axillary Oral    Resp: 18 18 18 17   Height:      Weight: 127.279 kg (280 lb 9.6 oz)     SpO2: 93% 96% 95% 98%    Intake/Output Summary (Last 24 hours) at 01/27/12 1433 Last data filed at 01/27/12 1407  Gross per 24 hour  Intake    570 ml  Output   1800 ml  Net  -1230 ml   Weight change: 0.454 kg (1 lb) Exam:   General:  Pt is alert, follows commands appropriately, not in acute distress  HEENT: No icterus, No thrush, [place]/AT  Cardiovascular: RRR, S1/S2, no rubs, no gallops  Respiratory: CTA bilaterally, no wheezing, no crackles, no rhonchi  Abdomen: Soft/+BS, non tender, non distended, no guarding  Extremities: No edema, No lymphangitis, No petechiae, No rashes, no synovitis  Data Reviewed: Basic Metabolic Panel:  Lab 01/27/12 1610 01/26/12 0619 01/25/12 1813 01/25/12 1630 01/22/12 0654  NA 134* 128* 127* 129* 132*  K 3.7 4.2 4.6 4.9 4.2  CL 95* 88* 85* 86* 90*  CO2 25 24 24 25  35*  GLUCOSE 129* 178* 150* 161* 149*  BUN 78* 90* 81* 81* 22  CREATININE 3.07* 4.81* 3.71* 3.62* 0.97  CALCIUM 9.1 8.8 10.3 10.2 10.2  MG -- -- -- -- --  PHOS 5.7* -- -- -- --   Liver Function Tests:  Lab 01/27/12 0552 01/26/12 0619 01/25/12 1630  AST 33 25 31  ALT 48* 39* 48*  ALKPHOS 82 78 102  BILITOT  0.3 0.3 0.4  PROT 7.3 7.1 8.0  ALBUMIN 3.8 3.7 4.3    Lab 01/25/12 2120  LIPASE 70*  AMYLASE --   No results found for this basename: AMMONIA:5 in the last 168 hours CBC:  Lab 01/27/12 0552 01/26/12 0619 01/25/12 1630  WBC 11.6* 13.0* 14.7*  NEUTROABS 7.9* -- 11.9*  HGB 12.9 12.8 14.9  HCT 40.0 39.3 44.1  MCV 83.9 83.6 82.9  PLT 321 350 407*   Cardiac Enzymes:  Lab 01/26/12 0915 01/25/12 2119  CKTOTAL 340* --  CKMB -- --  CKMBINDEX -- --  TROPONINI -- <0.30   BNP: No components found with this basename: POCBNP:5 CBG:  Lab 01/27/12 1234 01/27/12 0740 01/26/12 2135 01/26/12 1712 01/26/12 1137  GLUCAP 209* 113* 128* 130* 119*    Recent Results (from the past 240  hour(s))  URINE CULTURE     Status: Normal (Preliminary result)   Collection Time   01/25/12  7:09 PM      Component Value Range Status Comment   Specimen Description URINE, CLEAN CATCH   Final    Special Requests NONE   Final    Culture  Setup Time 01/26/2012 15:57   Final    Colony Count 15,000 COLONIES/ML   Final    Culture GRAM NEGATIVE RODS   Final    Report Status PENDING   Incomplete   STOOL CULTURE     Status: Normal (Preliminary result)   Collection Time   01/26/12  1:10 AM      Component Value Range Status Comment   Specimen Description STOOL   Final    Special Requests NONE   Final    Culture NO SUSPICIOUS COLONIES, CONTINUING TO HOLD   Final    Report Status PENDING   Incomplete   CLOSTRIDIUM DIFFICILE BY PCR     Status: Normal   Collection Time   01/26/12  1:10 AM      Component Value Range Status Comment   C difficile by pcr NEGATIVE  NEGATIVE Final      Scheduled Meds:   . buPROPion  150 mg Oral Daily  . doxycycline  100 mg Oral Q12H  . ferrous sulfate  325 mg Oral BID WC  . insulin aspart  0-20 Units Subcutaneous TID WC  . insulin aspart  0-5 Units Subcutaneous QHS  . pantoprazole  40 mg Oral Daily  . sertraline  100 mg Oral Daily  . simvastatin  20 mg Oral QPM  . [COMPLETED] sodium chloride   Intravenous STAT   Continuous Infusions:   . sodium chloride 250 mL (01/26/12 1614)     Clarence Cogswell, DO  [facility] Pager [phone]  If 7PM-7AM, please contact night-coverage [email or web address] Password [email or web address] 01/27/2012, 2:33 PM   LOS: 2 days

## 2012-01-27 NOTE — Progress Notes (Signed)
[facility] ROUNDING NOTE   Subjective:   Interval History: none  Objective:  Vital signs in last 24 hours:  Temp:  [97.3 F (36.3 C)-97.9 F (36.6 C)] 97.9 F (36.6 C) (11/04 0519) Pulse Rate:  [61-71] 67  (11/04 0519) Resp:  [18] 18  (11/04 0519) BP: (97-114)/(47-68) 114/47 mmHg (11/04 0519) SpO2:  [93 %-97 %] 96 % (11/04 0519) Weight:  [127.279 kg (280 lb 9.6 oz)] 127.279 kg (280 lb 9.6 oz) (11/03 2155)  Weight change: 0.454 kg (1 lb) Filed Weights   01/25/12 2340 01/26/12 2155  Weight: 126.826 kg (279 lb 9.6 oz) 127.279 kg (280 lb 9.6 oz)    Intake/Output: I/O last 3 completed shifts: In: 1195.4 [I.V.:1195.4] Out: 1226 [Urine:1225; Stool:1]   Intake/Output this shift:     CVS- RRR RS- CTA ABD- BS present soft non-distended obese EXT- trace edema   Basic Metabolic Panel:  Lab 01/27/12 1610 01/26/12 0619 01/25/12 1813 01/25/12 1630 01/22/12 0654  NA 134* 128* 127* 129* 132*  K 3.7 4.2 4.6 4.9 4.2  CL 95* 88* 85* 86* 90*  CO2 25 24 24 25  35*  GLUCOSE 129* 178* 150* 161* 149*  BUN 78* 90* 81* 81* 22  CREATININE 3.07* 4.81* 3.71* 3.62* 0.97  CALCIUM 9.1 8.8 10.3 -- --  MG -- -- -- -- --  PHOS 5.7* -- -- -- --    Liver Function Tests:  Lab 01/27/12 0552 01/26/12 0619 01/25/12 1630  AST 33 25 31  ALT 48* 39* 48*  ALKPHOS 82 78 102  BILITOT 0.3 0.3 0.4  PROT 7.3 7.1 8.0  ALBUMIN 3.8 3.7 4.3    Lab 01/25/12 2120  LIPASE 70*  AMYLASE --   No results found for this basename: AMMONIA:3 in the last 168 hours  CBC:  Lab 01/27/12 0552 01/26/12 0619 01/25/12 1630  WBC 11.6* 13.0* 14.7*  NEUTROABS 7.9* -- 11.9*  HGB 12.9 12.8 14.9  HCT 40.0 39.3 44.1  MCV 83.9 83.6 82.9  PLT 321 350 407*    Cardiac Enzymes:  Lab 01/26/12 0915 01/25/12 2119  CKTOTAL 340* --  CKMB -- --  CKMBINDEX -- --  TROPONINI -- <0.30    BNP: No components found with this basename: POCBNP:5  CBG:  Lab 01/27/12 0740 01/26/12 2135 01/26/12 1712  01/26/12 1137 01/25/12 1847  GLUCAP 113* 128* 130* 119* 147*    Microbiology: Results for orders placed during the hospital encounter of 01/25/12  STOOL CULTURE     Status: Normal (Preliminary result)   Collection Time   01/26/12  1:10 AM      Component Value Range Status Comment   Specimen Description STOOL   Final    Special Requests NONE   Final    Culture Culture reincubated for better growth   Final    Report Status PENDING   Incomplete   CLOSTRIDIUM DIFFICILE BY PCR     Status: Normal   Collection Time   01/26/12  1:10 AM      Component Value Range Status Comment   C difficile by pcr NEGATIVE  NEGATIVE Final     Coagulation Studies: No results found for this basename: LABPROT:5,INR:5 in the last 72 hours  Urinalysis:  Basename 01/25/12 1909  COLORURINE YELLOW  LABSPEC 1.013  PHURINE 5.0  GLUCOSEU NEGATIVE  HGBUR LARGE*  BILIRUBINUR NEGATIVE  KETONESUR NEGATIVE  PROTEINUR NEGATIVE  UROBILINOGEN 0.2  NITRITE NEGATIVE  LEUKOCYTESUR MODERATE*      Imaging: Dg Chest 2 View  01/25/2012  *RADIOLOGY REPORT*  Clinical Data: Shortness of breath, nausea.  CHEST - 2 VIEW  Comparison: 01/16/2012  Findings: Cardiomegaly.  No confluent airspace opacities or effusions.  No acute bony abnormality.  IMPRESSION: Cardiomegaly.  No acute findings.   Original Report Authenticated By: Charlett Nose, M.D.    US Abdomen Complete  01/26/2012  *RADIOLOGY REPORT*  Clinical Data:  Acute renal failure.  Nausea and vomiting.  History of cholecystectomy.  COMPLETE ABDOMINAL ULTRASOUND  Comparison:  None.  Findings:  Gallbladder:  Surgically absent.  Common bile duct:  Within normal limits in caliber status post cholecystectomy measuring a maximum of 7 mm.  Liver:  Severe and diffuse fatty infiltration of the liver with marked increased echogenicity, poor definition of the liver architecture and poor through transmission.  No focal hepatic lesions or intrahepatic biliary dilatation.  IVC:  Normal.   Pancreas:  Normal.  Spleen:  Normal size and echogenicity without focal lesions.  Right Kidney:  13.4 cm in length. Normal renal cortical thickness and echogenicity without focal lesions or hydronephrosis.  Left Kidney:  14.9 cm in length. Normal renal cortical thickness and echogenicity without focal lesions or hydronephrosis.  Abdominal aorta:  Normal caliber.  IMPRESSION:  1.  Status post cholecystectomy with normal caliber common bile duct. 2.  Diffuse and fairly marked fatty infiltration of the liver. 3.  Unremarkable sonographic appearance of the pancreas, spleen and both kidneys.   Original Report Authenticated By: Rudie Meyer, M.D.      Medications:      . sodium chloride 250 mL (01/26/12 1614)  . [DISCONTINUED] sodium chloride 100 mL/hr at 01/25/12 2347      . buPROPion  150 mg Oral Daily  . doxycycline  100 mg Oral Q12H  . ferrous sulfate  325 mg Oral BID WC  . insulin aspart  0-20 Units Subcutaneous TID WC  . insulin aspart  0-5 Units Subcutaneous QHS  . pantoprazole  40 mg Oral Daily  . sertraline  100 mg Oral Daily  . simvastatin  20 mg Oral QPM  . [COMPLETED] sodium chloride   Intravenous STAT  . [DISCONTINUED] doxycycline  100 mg Oral Daily   acetaminophen, ondansetron (ZOFRAN) IV, ondansetron, zolpidem  Assessment/ Plan:  46 yr old female with hx asthma since childhoold, OSA, obesity, and hx 8 yr DM, HTN, with recent admit for vol xs, 10/24-10/29. Had contrasted CTs 2 in 3 day prior to D/c. Had Dx4 on Sat and admitted. CR 1.1 last admit and 3.6 on admit.   Acute renal failure secondary to contrast nephropathy. Creatinine improving.   LOS: 2 [place] W @TODAY @9 :04 AM

## 2012-01-27 NOTE — Progress Notes (Signed)
[name] is complaining of painful urination and frank blood in her urine.  Will continue to monitor for now.

## 2012-01-27 NOTE — ED Provider Notes (Signed)
Medical screening examination/treatment/procedure(s) were conducted as a shared visit with non-physician practitioner(s) and myself.  I personally evaluated the patient during the encounter  Nausea, vomiting, diarrhea, weakness. Discharged 2 days ago after volume overload admission. ARF with recent extensive diuresis and IV contrast on 10/24 and 10/29.    Glynn Octave, MD 01/27/12 [phone]

## 2012-01-28 ENCOUNTER — Ambulatory Visit (HOSPITAL_COMMUNITY): Payer: BC Managed Care – PPO

## 2012-01-28 DIAGNOSIS — E119 Type 2 diabetes mellitus without complications: Secondary | ICD-10-CM

## 2012-01-28 DIAGNOSIS — E871 Hypo-osmolality and hyponatremia: Secondary | ICD-10-CM

## 2012-01-28 LAB — URINE CULTURE: Colony Count: 15000

## 2012-01-28 LAB — BASIC METABOLIC PANEL
BUN: 41 mg/dL — ABNORMAL HIGH (ref 6–23)
CO2: 27 mEq/L (ref 19–32)
Calcium: 9.3 mg/dL (ref 8.4–10.5)
Chloride: 96 mEq/L (ref 96–112)
GFR calc Af Amer: 56 mL/min — ABNORMAL LOW (ref >90)
GFR calc non Af Amer: 48 mL/min — ABNORMAL LOW (ref >90)
Glucose, Bld: 174 mg/dL — ABNORMAL HIGH (ref 70–99)
Potassium: 3.7 mEq/L (ref 3.5–5.1)

## 2012-01-28 LAB — GLUCOSE, CAPILLARY: Glucose-Capillary: 181 mg/dL — ABNORMAL HIGH (ref 70–99)

## 2012-01-28 LAB — CBC
Platelets: 324 10*3/uL (ref 150–400)
RBC: 4.71 MIL/uL (ref 3.87–5.11)
RDW: 15.2 % (ref 11.5–15.5)
WBC: 10 10*3/uL (ref 4.0–10.5)

## 2012-01-28 MED ORDER — CARVEDILOL 3.125 MG PO TABS: 3.1250 mg | ORAL_TABLET | Freq: Two times a day (BID) | ORAL | Status: DC

## 2012-01-28 NOTE — Progress Notes (Signed)
Note elevated Hgb A1C last admission of 9.0.  Patient states that she has gone for outpatient diabetes education at a pharmacy and also at [facility] years ago.  Patient states that she is not interested in committing to go for OP education follow-up right now.  States she has too many doctor appointments and needs to rest after these back to back admissions.  Suggested she consider joining the free Diabetes Support Group at the [facility] after she recovers.  Patient states she will consider it. Terressa Evola S. Elsie Lincoln, RN, CNS, CDE Inpatient Diabetes Program, team pager [phone] or [phone]

## 2012-01-28 NOTE — Clinical Social Work Psychosocial (Signed)
Clinical Social Work Department BRIEF PSYCHOSOCIAL ASSESSMENT 01/28/2012  Patient:  [name], [name]     Account Number:  [id number]     Admit date:  01/25/2012  Clinical Social Worker:  Delmer Islam  Date/Time:  01/28/2012 03:09 AM  Referred by:  Physician  Date Referred:   Referred for  Other - See comment   Other Referral:   Financial concerns   Interview type:  Patient Other interview type:    PSYCHOSOCIAL DATA Living Status:  ALONE Admitted from facility:   Level of care:   Primary support name:   Primary support relationship to patient:  FAMILY Degree of support available:   Unknown    CURRENT CONCERNS Current Concerns  Financial Resources   Other Concerns:    SOCIAL WORK ASSESSMENT / PLAN MD requested CSW to speak with patient about financial concerns. CSW introduced self and acknowledged the patient. Patient is alert and oriented. Patient discussed with CSW intern her concerns of paying her medical bills. Patient stated that she has been in the hospital twice in the last six days, and knows that her medical bills are expensive. Patient strongly expressed that she will pay her medical bills but she wanted to seek assistance with paying them, if possible. CSW intern advised patient to contact the number on her prior hospital bill to help assist her with her concern.   Assessment/plan status:  No Further Intervention Required Other assessment/ plan:   Information/referral to community resources:   None needed at this time    PATIENT'S/FAMILY'S RESPONSE TO PLAN OF CARE: Patient was appreciative of the services provided by CSW and RN Case Manager   Fernande Boyden, BSW Student Intern

## 2012-01-28 NOTE — Progress Notes (Signed)
[facility] ROUNDING NOTE   Subjective:   Interval History:none  Objective:  Vital signs in last 24 hours:  Temp:  [97.6 F (36.4 C)-98.6 F (37 C)] 98.3 F (36.8 C) (11/05 0535) Pulse Rate:  [63-69] 69  (11/05 0535) Resp:  [17-18] 18  (11/05 0535) BP: (99-118)/(38-92) 106/48 mmHg (11/05 0535) SpO2:  [95 %-99 %] 98 % (11/05 0535) Weight:  [125.4 kg (276 lb 7.3 oz)] 125.4 kg (276 lb 7.3 oz) (11/04 2052)  Weight change: -1.879 kg (-4 lb 2.3 oz) Filed Weights   01/25/12 2340 01/26/12 2155 01/27/12 2052  Weight: 126.826 kg (279 lb 9.6 oz) 127.279 kg (280 lb 9.6 oz) 125.4 kg (276 lb 7.3 oz)    Intake/Output: I/O last 3 completed shifts: In: 1470.8 [P.O.:600; I.V.:868.8; IV Piggyback:2] Out: 3250 [Urine:3250]   Intake/Output this shift:     CVS- RRR RS- CTA ABD- BS present soft non-distended EXT- no edema   Basic Metabolic Panel:  Lab 01/28/12 1610 01/27/12 0552 01/26/12 0619 01/25/12 1813 01/25/12 1630  NA 133* 134* 128* 127* 129*  K 3.7 3.7 4.2 4.6 4.9  CL 96 95* 88* 85* 86*  CO2 27 25 24 24 25   GLUCOSE 174* 129* 178* 150* 161*  BUN 41* 78* 90* 81* 81*  CREATININE 1.31* 3.07* 4.81* 3.71* 3.62*  CALCIUM 9.3 9.1 8.8 -- --  MG -- -- -- -- --  PHOS -- 5.7* -- -- --    Liver Function Tests:  Lab 01/27/12 0552 01/26/12 0619 01/25/12 1630  AST 33 25 31  ALT 48* 39* 48*  ALKPHOS 82 78 102  BILITOT 0.3 0.3 0.4  PROT 7.3 7.1 8.0  ALBUMIN 3.8 3.7 4.3    Lab 01/25/12 2120  LIPASE 70*  AMYLASE --   No results found for this basename: AMMONIA:3 in the last 168 hours  CBC:  Lab 01/28/12 0650 01/27/12 0552 01/26/12 0619 01/25/12 1630  WBC 10.0 11.6* 13.0* 14.7*  NEUTROABS -- 7.9* -- 11.9*  HGB 12.8 12.9 12.8 14.9  HCT 39.7 40.0 39.3 44.1  MCV 84.3 83.9 83.6 82.9  PLT 324 321 350 407*    Cardiac Enzymes:  Lab 01/26/12 0915 01/25/12 2119  CKTOTAL 340* --  CKMB -- --  CKMBINDEX -- --  TROPONINI -- <0.30    BNP: No components found  with this basename: POCBNP:5  CBG:  Lab 01/27/12 2100 01/27/12 1712 01/27/12 1234 01/27/12 0740 01/26/12 2135  GLUCAP 218* 202* 209* 113* 128*    Microbiology: Results for orders placed during the hospital encounter of 01/25/12  URINE CULTURE     Status: Normal (Preliminary result)   Collection Time   01/25/12  7:09 PM      Component Value Range Status Comment   Specimen Description URINE, CLEAN CATCH   Final    Special Requests NONE   Final    Culture  Setup Time 01/26/2012 15:57   Final    Colony Count 15,000 COLONIES/ML   Final    Culture GRAM NEGATIVE RODS   Final    Report Status PENDING   Incomplete   STOOL CULTURE     Status: Normal (Preliminary result)   Collection Time   01/26/12  1:10 AM      Component Value Range Status Comment   Specimen Description STOOL   Final    Special Requests NONE   Final    Culture NO SUSPICIOUS COLONIES, CONTINUING TO HOLD   Final    Report Status PENDING  Incomplete   CLOSTRIDIUM DIFFICILE BY PCR     Status: Normal   Collection Time   01/26/12  1:10 AM      Component Value Range Status Comment   C difficile by pcr NEGATIVE  NEGATIVE Final     Coagulation Studies: No results found for this basename: LABPROT:5,INR:5 in the last 72 hours  Urinalysis:  Basename 01/25/12 1909  COLORURINE YELLOW  LABSPEC 1.013  PHURINE 5.0  GLUCOSEU NEGATIVE  HGBUR LARGE*  BILIRUBINUR NEGATIVE  KETONESUR NEGATIVE  PROTEINUR NEGATIVE  UROBILINOGEN 0.2  NITRITE NEGATIVE  LEUKOCYTESUR MODERATE*      Imaging: US Abdomen Complete  01/26/2012  *RADIOLOGY REPORT*  Clinical Data:  Acute renal failure.  Nausea and vomiting.  History of cholecystectomy.  COMPLETE ABDOMINAL ULTRASOUND  Comparison:  None.  Findings:  Gallbladder:  Surgically absent.  Common bile duct:  Within normal limits in caliber status post cholecystectomy measuring a maximum of 7 mm.  Liver:  Severe and diffuse fatty infiltration of the liver with marked increased echogenicity, poor  definition of the liver architecture and poor through transmission.  No focal hepatic lesions or intrahepatic biliary dilatation.  IVC:  Normal.  Pancreas:  Normal.  Spleen:  Normal size and echogenicity without focal lesions.  Right Kidney:  13.4 cm in length. Normal renal cortical thickness and echogenicity without focal lesions or hydronephrosis.  Left Kidney:  14.9 cm in length. Normal renal cortical thickness and echogenicity without focal lesions or hydronephrosis.  Abdominal aorta:  Normal caliber.  IMPRESSION:  1.  Status post cholecystectomy with normal caliber common bile duct. 2.  Diffuse and fairly marked fatty infiltration of the liver. 3.  Unremarkable sonographic appearance of the pancreas, spleen and both kidneys.   Original Report Authenticated By: Rudie Meyer, M.D.      Medications:      . sodium chloride 250 mL (01/26/12 1614)  . sodium chloride 75 mL/hr at 01/28/12 0515      . buPROPion  150 mg Oral Daily  . ferrous sulfate  325 mg Oral BID WC  . insulin aspart  0-20 Units Subcutaneous TID WC  . insulin aspart  0-5 Units Subcutaneous QHS  . pantoprazole  40 mg Oral Daily  . pneumococcal 23 valent vaccine  0.5 mL Intramuscular Tomorrow-1000  . sertraline  100 mg Oral Daily  . simvastatin  20 mg Oral QPM  . [DISCONTINUED] doxycycline  100 mg Oral Q12H   acetaminophen, ondansetron (ZOFRAN) IV, ondansetron, zolpidem  Assessment/ Plan:  46 yr old female with hx asthma since childhoold, OSA, obesity, and hx 8 yr DM, HTN, with recent admit for vol xs, 10/24-10/29. Had contrasted CTs 2 in 3 day prior to D/c. Had Dx4 on Sat and admitted. CR 1.1 last admit and 3.6 on admission    Acute renal failure secondary to contrast nephropathy. Creatinine improved. Does not require outpatient follow up   LOS: 3 [name] W @TODAY @8 :31 AM

## 2012-01-28 NOTE — Discharge Summary (Signed)
Physician Discharge Summary  [name] WUJ:[id number] DOB: December 22, 1965 DOA: 01/25/2012  PCP: Roxy Manns, MD  Admit date: 01/25/2012 Discharge date: 01/28/2012  Recommendations for Outpatient Follow-up:  1. Pt will need to follow up with PCP this week 2. BMP this week  Discharge Diagnoses:  Acute kidney injury  -resolving, creat = 1.31 today -Likely a combination of volume depletion and IV contrast nephropathy  -Patient had CT of the neck with contrast on 01/21/2012  -Patient also had CT angiogram of the chest on 01/16/2012.  -Patient has been having diarrhea for the past 2 days-->improved  -Abdominal ultrasound has been ordered--no hydronephrosis  -appreciate nephrology  -Hold Zocor due to mildly elevated CPK until she follows up with primary care physician -Hold torsemide ,Aldactone, and Micardis until she follows up with primary care physician -Judicious IV fluids with recent history of decompensated CHF  Chronic diastolic CHF  -Patient is well compensated at this time  -Echocardiogram on 01/16/2012 shows ejection fraction 50-55%  -d/c bisoprolol -Started the patient on carvedilol 3.125 mg twice a day Diarrhea  -Clostridium difficile PCR negative, stool culture negative  -Diarrhea improved  -Discontinue doxycycline  Pyuria  -Urine culture with only 15,000 colonies Ecoli -Patient was asymptomatic, afebrile -Would not restart antibiotics Diabetes mellitus type 2  -Discontinue metformin for now--do not restart until patient follows up with primary care Doc -NovoLog sliding scale  -Lantus 30 units at bedtime -Patient given instructions to check her sugars 4 times daily and take the log to her primary care provider to make further adjustments Obstructive sleep apnea with pulmonary arterial hypertension  -Continue nighttime CPAP  Iron deficiency anemia  -Hemoglobin is at baseline  -Continue supplemental iron  Leukocytosis  -Improved off of antibiotics -Patient afebrile  and hemodynamically stable  -Patient was recently on a prednisone taper  Discharge Condition: Stable  Disposition:  discharge home  Diet: ADA diet Wt Readings from Last 3 Encounters:  01/27/12 125.4 kg (276 lb 7.3 oz)  01/22/12 129.2 kg (284 lb 13.4 oz)  08/30/11 136.193 kg (300 lb 4 oz)    History of present illness:  46 yo female just d/c with new dx of chf was placed on multiple new meds improved while in the ed with less sob and less le edema. Sent home and was doing well and then this am started having nausea with progressive worsening vomiting and diarrhea both nonbloody. No fevers. Has not been urinating as much as she was in while in the hosp several days ago. No abd pain. No cp. No sob. No le edema.   Hospital Course:  The patient was found to be in acute renal failure. She had a serum creatinine of 4.81 at the time of admission. Serum lipase was minimally elevated at 70. She was not thought to have pancreatitis. Her minimal lipase elevation was likely due to her renal failure. The patient was given judicious fluids given her CHF. Her serum creatinine gradually improved. On the day of discharge it was 1.31. The patient's nausea and vomiting improved. She tolerated a carb modified diet. The patient's Lantus was cut in half, and the patient was given 30 units at bedtime. Her glycemic control was well maintained. The patient was instructed to go home on 30 units at bedtime. She was instructed to keep a glycemic log and check her sugars 4 times daily until she follows up with her primary care doctor. Her primary care doctor we'll make further adjustments based upon her glycemic log. In addition, the  patient remained normotensive off of her Micardis, Demadex, Aldactone, and bisoprolol. Instead of her bisoprolol, the patient will be started on carvedilol for her CHF. The patient was instructed not to restart her Zocor, Micardis, Demadex, Aldactone, metformin, and Amaryl until she follows up  with her primary care doctor. The patient's diarrhea did improve on its own. Her Clostridium difficile PCR was negative. The patient's leukocytosis is likely due to steroids. He improved on exam without any further antibiotics. Her doxycycline was discontinued and she remained off antibiotics throughout the rest of the hospitalization. The patient did have 15,000 colonies of Escherichia coli (ESBL) in her urine. However the patient had asymptomatic bacteriuria. She will not be started on any antibiotics.  Consultants: nephrology  Discharge Exam: Filed Vitals:   01/28/12 1644  BP: 127/69  Pulse: 68  Temp: 99.1 F (37.3 C)  Resp: 18   Filed Vitals:   01/28/12 0535 01/28/12 0900 01/28/12 1300 01/28/12 1644  BP: 106/48 106/55 123/59 127/69  Pulse: 69 70 67 68  Temp: 98.3 F (36.8 C) 98.4 F (36.9 C) 98.3 F (36.8 C) 99.1 F (37.3 C)  TempSrc: Oral Oral Oral Oral  Resp: 18 18 18 18   Height:      Weight:      SpO2: 98% 99% 95% 98%   General: A&O x 3, NAD, pleasant, cooperative Cardiovascular: RRR, no rub, no gallop, no S3 Respiratory: CTAB, no wheeze, no rhonchi Abdomen:soft, nontender, nondistended, positive bowel sounds Extremities: trace edema, No lymphangitis, no petechiae  Discharge Instructions  Discharge Orders    Future Appointments: Provider: Department: Dept Phone: Center:   01/31/2012 12:00 PM Judy Pimple, MD [facility] [phone] [id number]   02/13/2012 3:00 PM [facility] [phone] None     Future Orders Please Complete By Expires   Diet - low sodium heart healthy      Increase activity slowly      Discharge instructions      Comments:   Call your primary care provider to schedule an appointment for the end of this week Do not restart Demadex, Amaryl, Aldactone, Micardis, metformin until you see your primary care doctor. Take Lantus 30 units at bedtime. Check his sugars 4  times daily and take log to your primary care doctor for further adjustment of your Lantus. Stop Zebeta Start coreg 3.125 mg twice a day Do not restart Micardis for 2 weeks.       Medication List     As of 01/28/2012  6:40 PM    STOP taking these medications         bisoprolol 5 MG tablet   Commonly known as: ZEBETA      doxycycline 100 MG tablet   Commonly known as: VIBRA-TABS      FISH OIL PO      glimepiride 4 MG tablet   Commonly known as: AMARYL      insulin glargine 100 UNIT/ML injection   Commonly known as: LANTUS      metFORMIN 1000 MG tablet   Commonly known as: GLUCOPHAGE      multivitamin capsule      spironolactone 25 MG tablet   Commonly known as: ALDACTONE      telmisartan 80 MG tablet   Commonly known as: MICARDIS      torsemide 20 MG tablet   Commonly known as: DEMADEX      TAKE these medications  buPROPion 150 MG 24 hr tablet   Commonly known as: WELLBUTRIN XL   Take 150 mg by mouth daily.      carvedilol 3.125 MG tablet   Commonly known as: COREG   Take 1 tablet (3.125 mg total) by mouth 2 (two) times daily with a meal.      cetirizine 10 MG tablet   Commonly known as: ZYRTEC   Take 10 mg by mouth at bedtime.      ferrous sulfate 325 (65 FE) MG tablet   Take 325 mg by mouth 2 (two) times daily.      fluticasone 50 MCG/ACT nasal spray   Commonly known as: FLONASE   Place 2 sprays into the nose daily.      pantoprazole 40 MG tablet   Commonly known as: PROTONIX   Take 40 mg by mouth daily.      PROAIR HFA 108 (90 BASE) MCG/ACT inhaler   Generic drug: albuterol   Inhale 2 puffs into the lungs every 4 (four) hours as needed. For wheezing      sertraline 50 MG tablet   Commonly known as: ZOLOFT   Take 2 tablets (100 mg total) by mouth daily.      simvastatin 20 MG tablet   Commonly known as: ZOCOR   Take 20 mg by mouth every evening.      zolpidem 10 MG tablet   Commonly known as: AMBIEN   Take 0.5-1 tablets (5-10 mg  total) by mouth at bedtime as needed. For sleep         The results of significant diagnostics from this hospitalization (including imaging, microbiology, ancillary and laboratory) are listed below for reference.    Significant Diagnostic Studies: Dg Chest 2 View  01/25/2012  *RADIOLOGY REPORT*  Clinical Data: Shortness of breath, nausea.  CHEST - 2 VIEW  Comparison: 01/16/2012  Findings: Cardiomegaly.  No confluent airspace opacities or effusions.  No acute bony abnormality.  IMPRESSION: Cardiomegaly.  No acute findings.   Original Report Authenticated By: Charlett Nose, M.D.    Ct Soft Tissue Neck W Contrast  01/21/2012  *RADIOLOGY REPORT*  Clinical Data: 46 year old female with goiter and tracheal compression.  CT NECK WITH CONTRAST  Technique:  Multidetector CT imaging of the neck was performed with intravenous contrast.  Contrast: 80mL OMNIPAQUE IOHEXOL 300 MG/ML  SOLN  Comparison: Chest CTA 01/16/2012.  Paranasal sinus CT 09/07/2010.  Findings: Large body habitus.  Mild respiratory motion artifact through the upper lungs which otherwise appear normal.  Left greater than right thyromegaly. In general, the gland encompasses 46 x 68 x 55 mm (AP by transverse by CC).  Mild compression and rightward deviation of the trachea at the level of the enlarged left thyroid lobe.  The degree of tracheal narrowing does not appear significant, and cross-sectional area of the tracheal is reduced by 30%.  Otherwise the visualized major airways are patent.  Within the enlarged left lobe, there is only a solitary rim calcified 11-12 mm nodule evident.  No discrete nodule evident in the right lobe.  No lymphadenopathy at level IV or at the thoracic inlet.  Larynx is within normal limits.  Retropharyngeal course of the bilateral carotid arteries results in mild effacement of the posterior pharynx.  Pharynx otherwise within normal limits. Retropharyngeal space otherwise within normal limits. Parapharyngeal and sublingual  spaces within normal limits. Submandibular and parotid glands within normal limits.  No cervical lymphadenopathy.  Visualized major vascular structures are patent.  Visualized paranasal sinuses  and mastoids are clear.  No acute osseous abnormality identified.  Negative visualized brain parenchyma. Visualized orbit soft tissues are within normal limits.  IMPRESSION: 1.  Generalized thyromegaly.  Mild rightward deviation and compression of the trachea, but no significant airway narrowing (decreased tracheal cross-sectional area of 30%). 2.  12 mm partially calcified left thyroid lobe nodule.  Consider further evaluation with thyroid ultrasound.  The patient is clinically hyperthyroid, consider nuclear medicine thyroid scan. 3.  Otherwise no acute findings in the neck.   Original Report Authenticated By: Harley Hallmark, M.D.    Ct Angio Chest Pe W/cm &/or Wo Cm  01/16/2012  *RADIOLOGY REPORT*  Clinical Data: Chest pain, shortness of breath, elevated D-dimer, evaluate for pulmonary embolism  CT ANGIOGRAPHY CHEST  Technique:  Multidetector CT imaging of the chest using the standard protocol during bolus administration of intravenous contrast. Multiplanar reconstructed images including MIPs were obtained and reviewed to evaluate the vascular anatomy.  Contrast: 80mL OMNIPAQUE IOHEXOL 350 MG/ML SOLN  Comparison: Chest CT - 10/12/2004  Vascular Findings:  There is suboptimal opacification of the pulmonary arterial system of the main pulmonary artery measuring 161 HU.  There are no discrete filling defects within the central pulmonary arterial system to the level of the bilateral segmental pulmonary arteries. There is marked enlargement of the main caliber the main pulmonary artery, measuring approximately 4.2 cm in greatest transverse axial dimension, unchanged.  Cardiomegaly.  No pericardial effusion.  Normal caliber of the thoracic aorta.  Bovine configuration of the aortic arch.  No definite periaortic stranding.   Nonvascular findings:  Evaluation of the pulmonary parenchyma is degraded secondary to patient body habitus and respiratory artifact.  Linear heterogeneous opacities within the left lower lobe and geographic area of peripheral consolidation within the right lower lobe (image 36) are favored to represent atelectasis. Scattered areas of ground-glass lucency are nonspecific but may be seen in the setting of airways disease.  There is minimal collapse of the distal aspect of the trachea and bilateral main pulmonary bronchi, nonspecific but may be seen in setting of tracheomalacia. The central airways remain patent.  No pleural effusion or pneumothorax.  Shoddy mediastinal lymph nodes are not enlarged by CT criteria with index precarinal node measuring 1 cm in short axis diameter (image 27, series 4 and index prevascular lymph node measuring 6 mm in short axis diameter (image 21).  No definite hilar or axillary lymphadenopathy.  Incidental imaging of the upper abdomen is normal.  There is an apparent interval enlargement of the left lobe of the thyroid, now with mass effect and rightward deviation of the tracheal air column.  This finding without a discrete thyroid nodule.  No acute or aggressive osseous abnormalities.  IMPRESSION:  1.  Degraded examination secondary to patient body habitus, respiratory artifact and suboptimal opacification of the pulmonary arterial system.  2.  No evidence of central pulmonary embolism.  If clinical concern persists for pulmonary embolism, further evaluation with VQ scan or bilateral lower extremity venous Doppler ultrasound may be performed as clinically indicated. 3.  Cardiomegaly and enlargement of the pulmonary arteries suggestive of pulmonary arterial hypertension.  Further evaluation with cardiac echo may be performed as clinically indicated. 4.  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  Further evaluation with PFTs and/or referral to  Pulmonology may be performed as clinically indicated.  5.  Apparent enlargement of the left lobe of the thyroid with mass effect upon the trachea.  Further evaluation with dedicated thyroid ultrasound  in the nonemergent setting is recommended.   Original Report Authenticated By: Waynard Reeds, M.D.    US Abdomen Complete  01/26/2012  *RADIOLOGY REPORT*  Clinical Data:  Acute renal failure.  Nausea and vomiting.  History of cholecystectomy.  COMPLETE ABDOMINAL ULTRASOUND  Comparison:  None.  Findings:  Gallbladder:  Surgically absent.  Common bile duct:  Within normal limits in caliber status post cholecystectomy measuring a maximum of 7 mm.  Liver:  Severe and diffuse fatty infiltration of the liver with marked increased echogenicity, poor definition of the liver architecture and poor through transmission.  No focal hepatic lesions or intrahepatic biliary dilatation.  IVC:  Normal.  Pancreas:  Normal.  Spleen:  Normal size and echogenicity without focal lesions.  Right Kidney:  13.4 cm in length. Normal renal cortical thickness and echogenicity without focal lesions or hydronephrosis.  Left Kidney:  14.9 cm in length. Normal renal cortical thickness and echogenicity without focal lesions or hydronephrosis.  Abdominal aorta:  Normal caliber.  IMPRESSION:  1.  Status post cholecystectomy with normal caliber common bile duct. 2.  Diffuse and fairly marked fatty infiltration of the liver. 3.  Unremarkable sonographic appearance of the pancreas, spleen and both kidneys.   Original Report Authenticated By: Rudie Meyer, M.D.    Dg Chest Portable 1 View  01/16/2012  *RADIOLOGY REPORT*  Clinical Data: Chest pressure.  Shortness of breath.  PORTABLE CHEST - 1 VIEW  Comparison: PA and lateral chest 08/08/2010.  Findings: There is cardiomegaly and pulmonary vascular congestion. No consolidative process, pneumothorax or effusion is identified.  IMPRESSION: No acute finding.  Cardiomegaly and pulmonary vascular  congestion.   Original Report Authenticated By: Bernadene Bell. Maricela Curet, M.D.      Microbiology: Recent Results (from the past 240 hour(s))  URINE CULTURE     Status: Normal   Collection Time   01/25/12  7:09 PM      Component Value Range Status Comment   Specimen Description URINE, CLEAN CATCH   Final    Special Requests NONE   Final    Culture  Setup Time 01/26/2012 15:57   Final    Colony Count 15,000 COLONIES/ML   Final    Culture     Final    Value: ESCHERICHIA COLI     Note: Confirmed Extended Spectrum Beta-Lactamase Producer (ESBL) CRITICAL RESULT CALLED TO, READ BACK BY AND VERIFIED WITH: ADRIAN B@10 :14AM ON 01/28/12 BY DANTS   Report Status 01/28/2012 FINAL   Final    Organism ID, Bacteria ESCHERICHIA COLI   Final   STOOL CULTURE     Status: Normal (Preliminary result)   Collection Time   01/26/12  1:10 AM      Component Value Range Status Comment   Specimen Description STOOL   Final    Special Requests NONE   Final    Culture NO SUSPICIOUS COLONIES, CONTINUING TO HOLD   Final    Report Status PENDING   Incomplete   CLOSTRIDIUM DIFFICILE BY PCR     Status: Normal   Collection Time   01/26/12  1:10 AM      Component Value Range Status Comment   C difficile by pcr NEGATIVE  NEGATIVE Final      Labs: Basic Metabolic Panel:  Lab 01/28/12 0454 01/27/12 0552 01/26/12 0619 01/25/12 1813 01/25/12 1630  NA 133* 134* 128* 127* 129*  K 3.7 3.7 -- -- --  CL 96 95* 88* 85* 86*  CO2 27 25 24 24  25  GLUCOSE 174* 129* 178* 150* 161*  BUN 41* 78* 90* 81* 81*  CREATININE 1.31* 3.07* 4.81* 3.71* 3.62*  CALCIUM 9.3 9.1 8.8 10.3 10.2  MG -- -- -- -- --  PHOS -- 5.7* -- -- --   Liver Function Tests:  Lab 01/27/12 0552 01/26/12 0619 01/25/12 1630  AST 33 25 31  ALT 48* 39* 48*  ALKPHOS 82 78 102  BILITOT 0.3 0.3 0.4  PROT 7.3 7.1 8.0  ALBUMIN 3.8 3.7 4.3    Lab 01/25/12 2120  LIPASE 70*  AMYLASE --   No results found for this basename: AMMONIA:5 in the last 168  hours CBC:  Lab 01/28/12 0650 01/27/12 0552 01/26/12 0619 01/25/12 1630  WBC 10.0 11.6* 13.0* 14.7*  NEUTROABS -- 7.9* -- 11.9*  HGB 12.8 12.9 12.8 14.9  HCT 39.7 40.0 39.3 44.1  MCV 84.3 83.9 83.6 82.9  PLT 324 321 350 407*   Cardiac Enzymes:  Lab 01/26/12 0915 01/25/12 2119  CKTOTAL 340* --  CKMB -- --  CKMBINDEX -- --  TROPONINI -- <0.30   BNP: No components found with this basename: POCBNP:5 CBG:  Lab 01/28/12 1154 01/28/12 0758 01/27/12 2100 01/27/12 1712 01/27/12 1234  GLUCAP 159* 181* 218* 202* 209*    Time coordinating discharge:  Greater than 30 minutes  Signed:  Ataya Murdy, DO [facility] Pager: [phone] 01/28/2012, 6:40 PM

## 2012-01-28 NOTE — Progress Notes (Signed)
[facility] spoke with Debbie regarding referral for home RN.

## 2012-01-28 NOTE — Progress Notes (Signed)
Critical lab called to MD, Urine with [id number] E-coli/ESBL.  Pt put on orange contact precuations.  Nsg to continue to follow.

## 2012-01-29 ENCOUNTER — Telehealth: Payer: Self-pay

## 2012-01-29 LAB — GLUCOSE, CAPILLARY: Glucose-Capillary: 185 mg/dL — ABNORMAL HIGH (ref 70–99)

## 2012-01-29 NOTE — Telephone Encounter (Signed)
Amber nse with [facility] request verbal orders for home health to follow pt 1 week 1; then 2 weeks 1; then 1 week 2; this will be total of 5 visits.Please advise. Pt has f/u appt with Dr Milinda Antis on 01/31/12.pt discharged from hospital 01/28/12.

## 2012-01-29 NOTE — Telephone Encounter (Signed)
Please go ahead and verbally ok those orders

## 2012-01-30 LAB — STOOL CULTURE

## 2012-01-30 NOTE — Telephone Encounter (Signed)
Talked to [facility] nurse with [facility] and gave her the verbal OK for the orders

## 2012-01-31 ENCOUNTER — Ambulatory Visit (INDEPENDENT_AMBULATORY_CARE_PROVIDER_SITE_OTHER): Payer: BC Managed Care – PPO | Admitting: Family Medicine

## 2012-01-31 ENCOUNTER — Telehealth: Payer: Self-pay

## 2012-01-31 ENCOUNTER — Encounter: Payer: Self-pay | Admitting: Family Medicine

## 2012-01-31 VITALS — BP 118/78 | HR 84 | Temp 98.6°F | Ht 65.0 in | Wt 277.0 lb

## 2012-01-31 DIAGNOSIS — N390 Urinary tract infection, site not specified: Secondary | ICD-10-CM

## 2012-01-31 DIAGNOSIS — N179 Acute kidney failure, unspecified: Secondary | ICD-10-CM

## 2012-01-31 DIAGNOSIS — E041 Nontoxic single thyroid nodule: Secondary | ICD-10-CM

## 2012-01-31 DIAGNOSIS — I509 Heart failure, unspecified: Secondary | ICD-10-CM

## 2012-01-31 DIAGNOSIS — Z23 Encounter for immunization: Secondary | ICD-10-CM

## 2012-01-31 LAB — COMPREHENSIVE METABOLIC PANEL
ALT: 42 U/L — ABNORMAL HIGH (ref 0–35)
BUN: 9 mg/dL (ref 6–23)
CO2: 26 mEq/L (ref 19–32)
Calcium: 9.3 mg/dL (ref 8.4–10.5)
Chloride: 103 mEq/L (ref 96–112)
Creatinine, Ser: 0.9 mg/dL (ref 0.4–1.2)
GFR: 68.91 mL/min (ref >60.00)
Glucose, Bld: 136 mg/dL — ABNORMAL HIGH (ref 70–99)

## 2012-01-31 NOTE — Telephone Encounter (Signed)
Orders faxed

## 2012-01-31 NOTE — Telephone Encounter (Signed)
Done and in IN box 

## 2012-01-31 NOTE — Patient Instructions (Addendum)
Pneumonia vaccine today Lab today Urine culture today Do not change any medicines for now  Follow up after cardiology visit please

## 2012-01-31 NOTE — Telephone Encounter (Signed)
Pt left v/m ;pt spoke with [facility] re: oxygen use; Dr Milinda Antis needs to send [facility] a written script to discontinue pts O2 by [facility].Pt request call back.

## 2012-01-31 NOTE — Progress Notes (Signed)
Subjective:    Patient ID: [name], female    DOB: [birth year], 46 y.o.   MRN: [id number]  HPI Here for f/u of hosp - from chf and then dehydration/ renal fail/ diarrhea  Was d/c home off diuretics and arb and metformin Rev disch summaries with pt in detail  Started on carvedilol-no problems with that - bp is very good  Holding zocor due to high cpk- back on it now   Cr was up to 4.8 at admit, down to 1.31 at discharge   Is generally tired but overall better  Still has a little diarrhea - but much better  No more vomiting at all    Sugars are overall better than usual 150-190- much better than usual   Lab Results  Component Value Date   HGBA1C 9.0* 01/17/2012     [facility] has been coming out  Checking bp and vitals  Brought out 02- needed it first hosp , and now does not  Pulse ox normal now 97-98% even when walking  Needs order to d/c that  Using her cpap- is already feeling better from that   Cardiology appt is the 21st  Has short term disability and fmla paperwork Is working at [employer] - has to climb 25 steps   Will see how she does over the weekend and then decide when to go back to work   Is walking and changing diet at home  Will need endo consult -goiter   Patient Active Problem List  Diagnosis  . THYROID DISORDER  . DIABETES MELLITUS, TYPE II  . POLYCYSTIC OVARIES  . HYPERLIPIDEMIA  . ANEMIA-IRON DEFICIENCY  . DEPRESSION  . OTHER ORGANIC SLEEP DISORDERS  . HYPERTENSION  . HEMORRHOIDS, INTERNAL, WITH BLEEDING  . EXTERNAL HEMORRHOIDS  . ASTHMA  . GERD  . OBESITY, MORBID  . Chronic allergic rhinitis  . Insomnia  . Stress reaction  . Hypertensive urgency  . CHF (congestive heart failure)  . Chest pain  . Anxiety  . Acute diastolic heart failure  . OSA (obstructive sleep apnea) just got cpap at home this past week 01/17/12  . Hypokalemia  . Acute bronchitis  . Thyroid nodule  . Acute renal failure  . Nausea & vomiting    . Diarrhea  . Diastolic CHF  . Hyponatremia   Past Medical History  Diagnosis Date  . Anemia     iron deficiency  . Asthma     a. 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  . Depression     a. Stress reaction 08/2011 in multiple social stressors.  . Diabetes mellitus   . Hypertension   . Morbid obesity   . Hyperlipidemia   . Vertigo   . PCOS (polycystic ovarian syndrome)   . Gastritis   . Insomnia   . Complication of anesthesia     DIFFICULT WAKING  . GERD (gastroesophageal reflux disease)   . OSA (obstructive sleep apnea) 01/17/2012   Past Surgical History  Procedure Date  . Cholecystectomy   . Hemorrhoid surgery   . Tubal ligation   . Shoulder surgery 06/2009  . Tonsillectomy and adenoidectomy CHILD  . Intrauterine device insertion 2011   History  Substance Use Topics  . Smoking status: Former Smoker -- 0.2 packs/day for 5 years    Quit date: 03/25/2004  . Smokeless tobacco: Never Used  . Alcohol Use: No   Family History  Problem Relation Age of Onset  .  Cancer Father     colon cancer  . Arthritis Father   . Arthritis Mother   . Asthma Mother   . Hypertension Mother    Allergies  Allergen Reactions  . Buspirone Hcl Nausea Only  . Glipizide     REACTION: sleepy, low sugar  . Ivp Dye (Iodinated Diagnostic Agents)     Shut kidneys down  . Oxycodone-Acetaminophen Itching   Current Outpatient Prescriptions on File Prior to Visit  Medication Sig Dispense Refill  . albuterol (PROAIR HFA) 108 (90 BASE) MCG/ACT inhaler Inhale 2 puffs into the lungs every 4 (four) hours as needed. For wheezing      . buPROPion (WELLBUTRIN XL) 150 MG 24 hr tablet Take 150 mg by mouth daily.      . carvedilol (COREG) 3.125 MG tablet Take 1 tablet (3.125 mg total) by mouth 2 (two) times daily with a meal.  60 tablet  0  . cetirizine (ZYRTEC) 10 MG tablet Take 10 mg by mouth as needed.       . ferrous sulfate 325 (65 FE) MG tablet Take 325 mg by  mouth 2 (two) times daily.        . fluticasone (FLONASE) 50 MCG/ACT nasal spray Place 2 sprays into the nose daily.  16 g  0  . pantoprazole (PROTONIX) 40 MG tablet Take 40 mg by mouth daily.      . sertraline (ZOLOFT) 50 MG tablet Take 2 tablets (100 mg total) by mouth daily.  30 tablet  11  . simvastatin (ZOCOR) 20 MG tablet Take 20 mg by mouth every evening.      . zolpidem (AMBIEN) 10 MG tablet Take 0.5-1 tablets (5-10 mg total) by mouth at bedtime as needed. For sleep  30 tablet  3       Review of Systems Review of Systems  Constitutional: Negative for fever, appetite change,  and unexpected weight change. pos for fatigue and general deconditioning  Eyes: Negative for pain and visual disturbance.  Respiratory: Negative for cough and shortness of breath.   Cardiovascular: Negative for cp or palpitations   neg for edema or PND or orthopnea  Gastrointestinal: Negative for nausea, diarrhea and constipation.  Genitourinary: Negative for urgency and frequency.  Skin: Negative for pallor or rash   Neurological: Negative for weakness, light-headedness, numbness and headaches.  Hematological: Negative for adenopathy. Does not bruise/bleed easily.  Psychiatric/Behavioral: Negative for dysphoric mood. The patient is not nervous/anxious.         Objective:   Physical Exam  Constitutional: She appears well-developed and well-nourished. No distress.       obese and well appearing   HENT:  Head: Normocephalic and atraumatic.  Left Ear: External ear normal.  Mouth/Throat: Oropharynx is clear and moist.  Eyes: Conjunctivae normal and EOM are normal. Pupils are equal, round, and reactive to light. Right eye exhibits no discharge. Left eye exhibits no discharge. No scleral icterus.  Neck: Normal range of motion. Neck supple. No JVD present. Carotid bruit is not present. No thyromegaly present.  Cardiovascular: Normal rate, regular rhythm, normal heart sounds and intact distal pulses.  Exam  reveals no gallop.   Pulmonary/Chest: Effort normal and breath sounds normal. No respiratory distress. She has no wheezes. She has no rales.       No crackles or dullness  Abdominal: Soft. Bowel sounds are normal. She exhibits no distension, no abdominal bruit and no mass. There is no tenderness.  Musculoskeletal: She exhibits no edema and no  tenderness.       No edema  Lymphadenopathy:    She has no cervical adenopathy.  Neurological: She is alert. She has normal reflexes. No cranial nerve deficit. She exhibits normal muscle tone. Coordination normal.  Skin: Skin is warm and dry. No rash noted. No erythema. No pallor.       Brisk capillary refil ,  Good skin turgor  Psychiatric: She has a normal mood and affect.          Assessment & Plan:

## 2012-02-02 ENCOUNTER — Telehealth: Payer: Self-pay | Admitting: Family Medicine

## 2012-02-02 LAB — URINE CULTURE: Colony Count: >=100000

## 2012-02-02 NOTE — Assessment & Plan Note (Signed)
Pt had small amt of ecoli in hosp Repeat urine cx today No symptoms

## 2012-02-02 NOTE — Assessment & Plan Note (Signed)
Rev hosp notes in detail  Was diuresed for chf and then unfortunately aquired a viral gastroenteritis - and became very dehydrated Now seems to be much improved  Lab today  Holding miacardis and also diuretics with nl bp  Will f/u after cardiol visit

## 2012-02-02 NOTE — Assessment & Plan Note (Signed)
Much improved after initial diuresis and beginning of cpap  Etiology is sleep apnea  For f/u with cardiology later this mo On beta blocker and tolerating it well  Holding diuretics for dehydration with recent gastroenteritis - but does not seem to need them yet and bp is good

## 2012-02-02 NOTE — Assessment & Plan Note (Signed)
Will need endo referral for goiter after her cardiol visit-will disc at f/u Lab Results  Component Value Date   TSH 1.528 01/18/2012

## 2012-02-03 NOTE — Telephone Encounter (Signed)
Error

## 2012-02-04 ENCOUNTER — Encounter: Payer: Self-pay | Admitting: Adult Health

## 2012-02-04 ENCOUNTER — Other Ambulatory Visit (INDEPENDENT_AMBULATORY_CARE_PROVIDER_SITE_OTHER): Payer: BC Managed Care – PPO

## 2012-02-04 ENCOUNTER — Ambulatory Visit (INDEPENDENT_AMBULATORY_CARE_PROVIDER_SITE_OTHER): Payer: BC Managed Care – PPO | Admitting: Adult Health

## 2012-02-04 VITALS — BP 118/82 | HR 83 | Temp 98.4°F | Ht 65.0 in | Wt 277.4 lb

## 2012-02-04 DIAGNOSIS — I509 Heart failure, unspecified: Secondary | ICD-10-CM

## 2012-02-04 DIAGNOSIS — E079 Disorder of thyroid, unspecified: Secondary | ICD-10-CM

## 2012-02-04 DIAGNOSIS — N390 Urinary tract infection, site not specified: Secondary | ICD-10-CM

## 2012-02-04 DIAGNOSIS — I5030 Unspecified diastolic (congestive) heart failure: Secondary | ICD-10-CM

## 2012-02-04 DIAGNOSIS — G4733 Obstructive sleep apnea (adult) (pediatric): Secondary | ICD-10-CM

## 2012-02-04 NOTE — Assessment & Plan Note (Signed)
Recently decompensated Diastolic dyspfnx complicated by OSA Improved w/ diuresis and CPAP  Cont on current regimen follow up with cards next week.

## 2012-02-04 NOTE — Assessment & Plan Note (Signed)
follow up with ENT as planned per PCP

## 2012-02-04 NOTE — Patient Instructions (Addendum)
WEAR CPAP EVERYNIGHT WITH OXYGEN  Mucinex DM Twice daily  As needed  Cough/congestoin  Zyrtec 10mg  At bedtime  As needed  Drainage.  Low salt diet  Follow up Dr. [name]  In 2-3 weeks and As needed

## 2012-02-04 NOTE — Progress Notes (Signed)
46 yo woman, former small tobacco hx, childhood asthma, allergies. Asthma sx returned in her 68's. Every Spring/Fall her allergies are much worse, accompanied by cough w purulent sputum, wheeze. She has had a lot of difficulty over the last 2 -3 months. Was treated with abx x 3 rounds, prednisone x 1. CT sinuses showed acute sinusitis. Started zyrtec daily, mucinex. Advair was added on 09/28/10, took it for 2 weeks. Feels better even off the Advair (stopped late July). She has a SABA, was using 4x a day when she was sick. Rarely using otherwise. Triggers include allergy season, mowing gas. She described sleep disturbance, snoring.   ROV 12/07/10 -- Hx of allergies and asthma. Had PFT done 9/14, PSG done on 8/26. She is taking zyrec + fluticasone + [place].  She has been using SABA prn, uses after walking long distance. Had an episode acute dyspnea after she heard that her father was ill.  >>POSSIBLE mild AFL, nost likely no evidence asthma. I believe most of her sx are UA and related to allergies.   02/04/2012 Post Hospital  Patient presents for a two-week followup from the hospital She was initially admitted October 24 through October 30 for for respiratory distress, with dyspnea, felt to be secondary to obstructive sleep apnea with secondary pulmonary artery hypertension and decompensated diastolic heart failure. She responded well to aggressive IV diuresis, and was. She had 18 L, diuresed during her admission She had previously been found to have obstructive sleep apnea, was unable to afford her CPAP during admission. She was placed on CPAP and set up with outpatient CPAP She says, that she's been wearing her CPAP machine. Each night without any difficulty She was also started on oxygen to be used with activity and at bedtime She says that her O2 saturations have not dropped below 90%. Since discharge, and she is no longer wearing her oxygen. Today in the office. She had no desaturations with walking and  maintaining O2 saturation approximately around 95% She was also found to have a thyroid goiter with thyromegaly and deviation of trachea on CT She has been seen by her primary care physician and recommended to be referred to ENT for further evaluation Unfortunately, patient was readmitted to the hospital, November 2 through November 5, for acute kidney injury, felt secondary to a combination of volume depletion and IV contrast nephropathy She was treated with fluid hydration and furosemide, Aldactone, MI, cards were stopped She was placed on Coreg prior to discharge  Since discharge. She is feeling better with decreased shortness of breath Weight is down approximately 30 pounds per patient, since initial admission    ROS:  Constitutional:   No  weight loss, night sweats,  Fevers, chills, + fatigue, or  lassitude.  HEENT:   No headaches,  Difficulty swallowing,  Tooth/dental problems, or  Sore throat,                No sneezing, itching, ear ache, nasal congestion, post nasal drip,   CV:  No chest pain,  Orthopnea, PND,  , anasarca, dizziness, palpitations, syncope.   GI  No heartburn, indigestion, abdominal pain, nausea, vomiting, diarrhea, change in bowel habits, loss of appetite, bloody stools.   Resp:  .  No excess mucus, no productive cough,  No non-productive cough,  No coughing up of blood.  No change in color of mucus.  No wheezing.  No chest wall deformity  Skin: no rash or lesions.  GU: no dysuria, change in color of  urine, no urgency or frequency.  No flank pain, no hematuria   MS:  No joint pain or swelling.  No decreased range of motion.  No back pain.  Psych:  No change in mood or affect. No depression or anxiety.  No memory loss.      EXAM:   Gen: Pleasant, obese woman, in no distress,  normal affect  ENT: No lesions,  mouth clear,  oropharynx clear, no postnasal drip, mild hirsuitism  Neck: No JVD, no TMG, no carotid bruits  Lungs: No use of accessory  muscles, no dullness to percussion, clear without rales or rhonchi  Cardiovascular: RRR, heart sounds normal, no murmur or gallops, trace peripheral edema  Musculoskeletal: No deformities, no cyanosis or clubbing  Neuro: alert, non focal  Skin: Warm, no lesions or rashes

## 2012-02-04 NOTE — Assessment & Plan Note (Signed)
Cont on CPAP At bedtime  W/ O2  Weight loss.  Will need download in future , may need sleep MD referral -will leave to Dr. [name]

## 2012-02-06 LAB — URINE CULTURE

## 2012-02-10 ENCOUNTER — Encounter (HOSPITAL_COMMUNITY): Payer: Self-pay

## 2012-02-10 ENCOUNTER — Ambulatory Visit (HOSPITAL_COMMUNITY)
Admission: RE | Admit: 2012-02-10 | Discharge: 2012-02-10 | Disposition: A | Discharge status: Home / Self Care | Payer: BC Managed Care – PPO | Source: Ambulatory Visit | Attending: Internal Medicine | Admitting: Internal Medicine

## 2012-02-10 ENCOUNTER — Telehealth (HOSPITAL_COMMUNITY): Payer: Self-pay | Admitting: Cardiology

## 2012-02-10 VITALS — BP 154/86 | HR 79 | Wt 280.4 lb

## 2012-02-10 DIAGNOSIS — I509 Heart failure, unspecified: Secondary | ICD-10-CM

## 2012-02-10 DIAGNOSIS — I5020 Unspecified systolic (congestive) heart failure: Secondary | ICD-10-CM

## 2012-02-10 DIAGNOSIS — E119 Type 2 diabetes mellitus without complications: Secondary | ICD-10-CM

## 2012-02-10 DIAGNOSIS — R079 Chest pain, unspecified: Secondary | ICD-10-CM

## 2012-02-10 DIAGNOSIS — J45909 Unspecified asthma, uncomplicated: Secondary | ICD-10-CM

## 2012-02-10 DIAGNOSIS — I5032 Chronic diastolic (congestive) heart failure: Secondary | ICD-10-CM | POA: Insufficient documentation

## 2012-02-10 DIAGNOSIS — I5030 Unspecified diastolic (congestive) heart failure: Secondary | ICD-10-CM

## 2012-02-10 MED ORDER — SPIRONOLACTONE 25 MG PO TABS: 25.0000 mg | ORAL_TABLET | Freq: Every day | ORAL | Status: DC

## 2012-02-10 MED ORDER — TORSEMIDE 20 MG PO TABS: 20.0000 mg | ORAL_TABLET | Freq: Every day | ORAL | Status: DC

## 2012-02-10 NOTE — Telephone Encounter (Signed)
C/O 3 lb weight gain overnight, 277.8-->280.2 Not feeling well 02/09/12 c/o chest pains, SOB, swelling in ankles and feet, hot and cold sweats Not able to urinate as well.   No changes in diet or medicatons  Please advise has a follow up on 02/13/12

## 2012-02-10 NOTE — Assessment & Plan Note (Addendum)
Volume status appears stable. Reviewed most recent BMET 01/31/12. Creatinine down to 0.9. Restart Demadex 20 mg daily and Spirolactone 25 mg daily. Baseline weight appears to be 275-280 pounds. If weight is 281 or greater she is instructed to take an additional Torsemide. She will hold torsemide if weight is less than 275 pounds. Repeat BMET next week.  Follow up in 2 weeks to reassess volume.   Patient seen and examined with Tonye Becket, NP. We discussed all aspects of the encounter. I agree with the assessment and plan as stated above.Volume status looks much better but exam is difficult. Extensive discussion about need for daily weights and reviewed use of sliding scale diuretics in detail.

## 2012-02-10 NOTE — Telephone Encounter (Signed)
Spoke w/pt she states she has been feeling bad since yesterday, her wt has been stable but increased 3 lb today from yesterday.  She states yesterday she felt very bloated, that is better today, she does have a "hacking cough" and edema in ankles.  No fever, she feels like she isn't urinating enough, had urine culture 11/12 with contamination.  Pt was scheduled to see [place] on Thur 11/21, rescheduled appt to today at 11:15

## 2012-02-10 NOTE — Assessment & Plan Note (Addendum)
LHC 2006 normal coronaries. Due to recent chest pain will order Lexie Scan the evaluate.  Attending: CP with typical and atypical features. Does have multiple CRFs. Will plan Lexiscan Myoview (unable to walk far on TM).

## 2012-02-10 NOTE — Patient Instructions (Addendum)
Take Demadex 20 mg daily Take extra Demadex if your weight is 281 pounds or greater. If weight is less than 275 pounds hold Demadex  Take Spirolactone 25 mg daily  Follow up in 2 weeks.   Do the following things EVERYDAY: 1) Weigh yourself in the morning before breakfast. Write it down and keep it in a log. 2) Take your medicines as prescribed 3) Eat low salt foods-Limit salt (sodium) to 2000 mg per day.  4) Stay as active as you can everyday 5) Limit all fluids for the day to less than 2 liters

## 2012-02-13 ENCOUNTER — Telehealth: Payer: Self-pay | Admitting: Adult Health

## 2012-02-13 ENCOUNTER — Telehealth: Payer: Self-pay | Admitting: Family Medicine

## 2012-02-13 ENCOUNTER — Encounter (HOSPITAL_COMMUNITY): Payer: BC Managed Care – PPO

## 2012-02-13 ENCOUNTER — Telehealth: Payer: Self-pay

## 2012-02-13 MED ORDER — ALBUTEROL SULFATE HFA 108 (90 BASE) MCG/ACT IN AERS: 2.0000 | INHALATION_SPRAY | RESPIRATORY_TRACT | Status: DC | PRN

## 2012-02-13 NOTE — Telephone Encounter (Signed)
Please verbally ok that, thanks  

## 2012-02-13 NOTE — Telephone Encounter (Signed)
I spoke with pt and is aware rx has been sent. Nothing further was needed 

## 2012-02-13 NOTE — Telephone Encounter (Signed)
Patient advised.  Appt scheduled 02/24/12 at patient's request.

## 2012-02-13 NOTE — Telephone Encounter (Signed)
How about the first week in December?

## 2012-02-13 NOTE — Telephone Encounter (Signed)
Amy, nurse with [facility] for extension of nurse visits for weight gain, CHF, chest pain (pt following up with cardiologist for chest pain). Amy request 1 time a week for few more weeks to continue with CHF education.Please advise.

## 2012-02-13 NOTE — Telephone Encounter (Signed)
Gave [name] verbal OK

## 2012-02-13 NOTE — Telephone Encounter (Signed)
Caller: Erynn/Patient; Phone: [phone]; Reason for Call: Patient calling, she saw the cardiologist on 11/18 and has a 2 day stress test scheduled for 11/25 and 11/26.  When do you want her to come back to see you?

## 2012-02-17 ENCOUNTER — Ambulatory Visit (INDEPENDENT_AMBULATORY_CARE_PROVIDER_SITE_OTHER): Payer: BC Managed Care – PPO | Admitting: *Deleted

## 2012-02-17 ENCOUNTER — Telehealth (HOSPITAL_COMMUNITY): Payer: Self-pay | Admitting: Cardiology

## 2012-02-17 ENCOUNTER — Ambulatory Visit (HOSPITAL_COMMUNITY): Payer: BC Managed Care – PPO | Attending: Internal Medicine | Admitting: Radiology

## 2012-02-17 VITALS — BP 144/87 | Ht 65.0 in | Wt 276.0 lb

## 2012-02-17 DIAGNOSIS — I10 Essential (primary) hypertension: Secondary | ICD-10-CM

## 2012-02-17 DIAGNOSIS — R079 Chest pain, unspecified: Secondary | ICD-10-CM

## 2012-02-17 DIAGNOSIS — I5030 Unspecified diastolic (congestive) heart failure: Secondary | ICD-10-CM

## 2012-02-17 DIAGNOSIS — R0602 Shortness of breath: Secondary | ICD-10-CM

## 2012-02-17 DIAGNOSIS — I5032 Chronic diastolic (congestive) heart failure: Secondary | ICD-10-CM | POA: Insufficient documentation

## 2012-02-17 LAB — BASIC METABOLIC PANEL
BUN: 10 mg/dL (ref 6–23)
Chloride: 101 mEq/L (ref 96–112)
Potassium: 4 mEq/L (ref 3.5–5.1)

## 2012-02-17 MED ORDER — TECHNETIUM TC 99M SESTAMIBI GENERIC - CARDIOLITE
33.0000 | Freq: Once | INTRAVENOUS | Status: AC | PRN
  Administered 2012-02-17: 33 via INTRAVENOUS

## 2012-02-17 MED ORDER — REGADENOSON 0.4 MG/5ML IV SOLN
0.4000 mg | Freq: Once | INTRAVENOUS | Status: AC
  Administered 2012-02-17: 0.4 mg via INTRAVENOUS

## 2012-02-17 NOTE — Telephone Encounter (Signed)
Pt will need BMET during Stress Test. Orders placed

## 2012-02-17 NOTE — Progress Notes (Signed)
[name] [name] [facility] [address] [id number] [place] [address] [phone]  Cardiology Nuclear Med Study  [name] is a 46 y.o. female     MRN : [id number]     DOB: 10-04-1965  Procedure Date: 02/17/2012  Nuclear Med Background Indication for Stress Test:  Evaluation for Ischemia History:  Asthma and CHF, 2006 Heart Cath: Catheter induced RCA spasm EF: 65%, 01/16/12 ECHO: 50-55%, Acuter Renal Failure Cardiac Risk Factors: History of Smoking, Hypertension, Lipids, NIDDM and Obesity  Symptoms:  Chest Pain, DOE, Palpitations and SOB   Nuclear Pre-Procedure Caffeine/Decaff Intake:  None > 12 hrs NPO After: 8:00pm   Lungs:  clear O2 Sat: 95% on room air. IV 0.9% NS with Angio Cath:  22g  IV Monica: R Wrist x 1, tolerated well IV Started by:  Irean Hong, RN  Chest Size (in):  48 Cup Size: C  Height: 5\' 5"  (1.651 m)  Weight:  276 lb (125.193 kg)  BMI:  Body mass index is 45.93 kg/(m^2). Tech Comments:  Held Coreg this am    Nuclear Med Study 1 or 2 day study: 2 day  Stress Test Type:  Treadmill/Lexiscan  Reading MD: Makayla Lanter,M.D. Order Authorizing Provider:  Arvilla Meres, MD  Resting Radionuclide: Technetium 88m Sestamibi  Resting Radionuclide Dose: 32.4 mCi   On     02-18-12  Stress Radionuclide:  Technetium 43m Sestamibi  Stress Radionuclide Dose: 31.6 mCi   On        02-17-12          Stress Protocol Rest HR: 65 Stress HR: 108  Rest BP: 144/87 Stress BP: 195/114  Exercise Time (min): n/a METS: n/a   Predicted Max HR: 174 bpm % Max HR: 62.07 bpm Rate Pressure Product: 27253   Dose of Adenosine (mg):  n/a Dose of Lexiscan: 0.4 mg  Dose of Atropine (mg): n/a Dose of Dobutamine: n/a mcg/kg/min (at max HR)  Stress Test Technologist: Milana Na, EMT-P  Nuclear Technologist:  Domenic Polite, CNMT     Rest Procedure:  Myocardial perfusion imaging was performed at rest 45 minutes following the intravenous  administration of Technetium 2m Sestamibi. Rest ECG: NSR - Normal EKG  Stress Procedure:  The patient received IV Lexiscan 0.4 mg over 15-seconds with concurrent low level exercise and then Technetium 68m Sestamibi was injected at 30-seconds while the patient continued walking one more minute. There were no significant changes with Lexiscan. Quantitative spect images were obtained after a 45-minute delay. Stress ECG: No significant change from baseline ECG  QPS Raw Data Images:  Normal; no motion artifact; normal heart/lung ratio. Stress Images:  Normal homogeneous uptake in all areas of the myocardium. Rest Images:  Normal homogeneous uptake in all areas of the myocardium. Subtraction (SDS):  There is no evidence of scar or ischemia. Transient Ischemic Dilatation (Normal <1.22):  0.96 Lung/Heart Ratio (Normal <0.45):  0.42  Quantitative Gated Spect Images QGS EDV:  124 ml QGS ESV:  52 ml  Impression Exercise Capacity:  Lexiscan with no exercise. BP Response:  Hypertensive blood pressure response. Clinical Symptoms:  Short of breath, dizzy.  ECG Impression:  No significant ST segment change suggestive of ischemia. Comparison with Prior Nuclear Study: No images to compare  Overall Impression:  Normal stress nuclear study.  LV Ejection Fraction: 58%.  LV Wall Motion:  NL LV Function; NL Wall Motion  Marca Ancona 02/18/2012

## 2012-02-18 ENCOUNTER — Other Ambulatory Visit: Payer: Self-pay | Admitting: *Deleted

## 2012-02-18 ENCOUNTER — Ambulatory Visit (HOSPITAL_COMMUNITY): Payer: BC Managed Care – PPO | Attending: Cardiology | Admitting: Radiology

## 2012-02-18 ENCOUNTER — Ambulatory Visit (INDEPENDENT_AMBULATORY_CARE_PROVIDER_SITE_OTHER): Payer: BC Managed Care – PPO | Admitting: Emergency Medicine

## 2012-02-18 ENCOUNTER — Encounter: Payer: Self-pay | Admitting: Emergency Medicine

## 2012-02-18 VITALS — BP 124/72 | HR 77 | Temp 98.2°F | Ht 65.0 in | Wt 278.0 lb

## 2012-02-18 DIAGNOSIS — I5032 Chronic diastolic (congestive) heart failure: Secondary | ICD-10-CM

## 2012-02-18 DIAGNOSIS — G4733 Obstructive sleep apnea (adult) (pediatric): Secondary | ICD-10-CM

## 2012-02-18 DIAGNOSIS — J45909 Unspecified asthma, uncomplicated: Secondary | ICD-10-CM

## 2012-02-18 MED ORDER — ZOLPIDEM TARTRATE 10 MG PO TABS: 5.0000 mg | ORAL_TABLET | Freq: Every evening | ORAL | Status: DC | PRN

## 2012-02-18 MED ORDER — TECHNETIUM TC 99M SESTAMIBI GENERIC - CARDIOLITE
32.4000 | Freq: Once | INTRAVENOUS | Status: AC | PRN
  Administered 2012-02-18: 32 via INTRAVENOUS

## 2012-02-18 NOTE — Assessment & Plan Note (Signed)
--  continue CPAP

## 2012-02-18 NOTE — Assessment & Plan Note (Signed)
Diuretic and BP management per Dr Gala Romney Nuclear stress test ECG reassuring, perfusion images pending

## 2012-02-18 NOTE — Telephone Encounter (Signed)
Rx called in as prescribed 

## 2012-02-18 NOTE — Telephone Encounter (Signed)
Px written for call in   

## 2012-02-18 NOTE — Patient Instructions (Addendum)
Please continue your CPAP every night. Continue the oxygen bled into the mask for now.  Try changing your zyrtec to claritin (loratadine 10mg  daily).  Use your fluticasone nasal spray.  Follow with Dr [name] in 4 months or sooner if you have any problems.

## 2012-02-18 NOTE — Assessment & Plan Note (Signed)
Her sx are primarily UA.  - ENT eval for the goiter - albuterol prn.

## 2012-02-18 NOTE — Progress Notes (Signed)
46 yo woman, former small tobacco hx, childhood asthma, allergies. Asthma sx returned in her 12's. Every Spring/Fall her allergies are much worse, accompanied by cough w purulent sputum, wheeze. She has had a lot of difficulty over the last 2 -3 months. Was treated with abx x 3 rounds, prednisone x 1. CT sinuses showed acute sinusitis. Started zyrtec daily, mucinex. Advair was added on 09/28/10, took it for 2 weeks. Feels better even off the Advair (stopped late July). She has a SABA, was using 4x a day when she was sick. Rarely using otherwise. Triggers include allergy season, mowing gas. She described sleep disturbance, snoring.   ROV 12/07/10 -- Hx of allergies and asthma. Had PFT done 9/14, PSG done on 8/26. She is taking zyrec + fluticasone + [place].  She has been using SABA prn, uses after walking long distance. Had an episode acute dyspnea after she heard that her father was ill.  >>POSSIBLE mild AFL, nost likely no evidence asthma. I believe most of her sx are UA and related to allergies.   [facility] 02/04/12 --  Patient presents for a two-week followup from the hospital She was initially admitted October 24 through October 30 for for respiratory distress, with dyspnea, felt to be secondary to obstructive sleep apnea with secondary pulmonary artery hypertension and decompensated diastolic heart failure. She responded well to aggressive IV diuresis, and was. She had 18 L, diuresed during her admission She had previously been found to have obstructive sleep apnea, was unable to afford her CPAP during admission. She was placed on CPAP and set up with outpatient CPAP She says, that she's been wearing her CPAP machine. Each night without any difficulty She was also started on oxygen to be used with activity and at bedtime She says that her O2 saturations have not dropped below 90%. Since discharge, and she is no longer wearing her oxygen. Today in the office. She had no desaturations with walking and  maintaining O2 saturation approximately around 95% She was also found to have a thyroid goiter with thyromegaly and deviation of trachea on CT She has been seen by her primary care physician and recommended to be referred to ENT for further evaluation Unfortunately, patient was readmitted to the hospital, November 2 through November 5, for acute kidney injury, felt secondary to a combination of volume depletion and IV contrast nephropathy She was treated with fluid hydration and furosemide, Aldactone, MI, cards were stopped She was placed on Coreg prior to discharge  Since discharge. She is feeling better with decreased shortness of breath Weight is down approximately 30 pounds per patient, since initial admission  ROV 02/18/12 -- follows up for UA disease due to allergies and a goiter, possible asthma, HTN with diastolic dysfxn, OSA on CPAP. She has had had some chest pain. Underwent nuclear stress testing 11/25 > no ECG changes, perfusion data not available yet. Using nasal steroid. The zyrtec make her sleepy. She is wearing CPAP regularly.   EXAM:  Filed Vitals:   02/18/12 1539  BP: 124/72  Pulse: 77  Temp: 98.2 F (36.8 C)    Gen: Pleasant, obese woman, in no distress,  normal affect  ENT: No lesions,  mouth clear,  oropharynx clear, no postnasal drip, mild hirsuitism  Neck: No JVD, no TMG, no carotid bruits  Lungs: No use of accessory muscles, no dullness to percussion, clear without rales or rhonchi  Cardiovascular: RRR, heart sounds normal, no murmur or gallops, trace peripheral edema  Musculoskeletal: No deformities,  no cyanosis or clubbing  Neuro: alert, non focal  Skin: Warm, no lesions or rashes   OSA (obstructive sleep apnea) just got cpap at home this past week 01/17/12 continue CPAP  Chronic diastolic heart failure Diuretic and BP management per Dr Gala Romney Nuclear stress test ECG reassuring, perfusion images pending  ASTHMA Her sx are primarily UA.  - ENT  eval for the goiter - albuterol prn.

## 2012-02-22 NOTE — Progress Notes (Signed)
Patient ID: [name], female   DOB: [birth year], 46 y.o.   MRN: [id number]  Weight Range   Baseline proBNP   PCP: Dr Lucretia Roers   HPI: [name] is a 46 y/o F hx of DM, HTN, HL, morbid obesity, OSA noncompliant with CPAP due to finances, anxiety whose cardiac hx includes absence of coronary artery disease on cath in 2006 for CP with only catheter-induced spasm of RCA. EF was 65% at that time.   Echo 10/24: EF 50-55%. Grade 2 diastolic dysfunction. RV mildly dilated. PAPP 68 mmHg   Admitted to [facility] 01/25/12 for acute renal failure related to CT contrast. Creatinine trended back dow from 4.81>1.3. While hospitalized Demadex 40 mg bid,  Aldactone 25 mg daily, and bisoprolol stopped. Started carvedilol 3.125 mg bid.    01/31/12 Creatinine 0.9 Potassium 3.7   She returns for post hospital follow up. Over the last 24 hours she complains of chest discomfort. + Orthopnea. Complains of dyspnea with exertion. Compliant with medications. Weight at home trending up 274-280 pounds. Followed by [facility]. Uses CPAP nightly with oxygen. PCP would like her to be cleared for additional thyroid work up. Works for [employer].   ROS: All systems negative except as listed in HPI, PMH and Problem List.  Past Medical History  Diagnosis Date  . Anemia     iron deficiency  . Asthma     a. 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  . Depression     a. Stress reaction 08/2011 in multiple social stressors.  . Diabetes mellitus   . Hypertension   . Morbid obesity   . Hyperlipidemia   . Vertigo   . PCOS (polycystic ovarian syndrome)   . Gastritis   . Insomnia   . Complication of anesthesia     DIFFICULT WAKING  . GERD (gastroesophageal reflux disease)   . OSA (obstructive sleep apnea) 01/17/2012    Current Outpatient Prescriptions  Medication Sig Dispense Refill  . buPROPion (WELLBUTRIN XL) 150 MG 24 hr tablet Take 150 mg by mouth daily.      . carvedilol (COREG)  3.125 MG tablet Take 1 tablet (3.125 mg total) by mouth 2 (two) times daily with a meal.  60 tablet  0  . cetirizine (ZYRTEC) 10 MG tablet Take 10 mg by mouth as needed.       . ferrous sulfate 325 (65 FE) MG tablet Take 325 mg by mouth 2 (two) times daily.        . fluticasone (FLONASE) 50 MCG/ACT nasal spray Place 2 sprays into the nose daily.  16 g  0  . pantoprazole (PROTONIX) 40 MG tablet Take 40 mg by mouth daily.      . sertraline (ZOLOFT) 50 MG tablet Take 2 tablets (100 mg total) by mouth daily.  30 tablet  11  . simvastatin (ZOCOR) 20 MG tablet Take 20 mg by mouth every evening.      Marland Kitchen albuterol (PROAIR HFA) 108 (90 BASE) MCG/ACT inhaler Inhale 2 puffs into the lungs every 4 (four) hours as needed. For wheezing  1 Inhaler  4  . spironolactone (ALDACTONE) 25 MG tablet Take 1 tablet (25 mg total) by mouth daily.  30 tablet  3  . torsemide (DEMADEX) 20 MG tablet Take 1 tablet (20 mg total) by mouth daily.  30 tablet  3  . zolpidem (AMBIEN) 10 MG tablet Take 0.5-1 tablets (5-10 mg total) by mouth at bedtime as needed.  For sleep  30 tablet  5     PHYSICAL EXAM: Filed Vitals:   02/10/12 1127  BP: 154/86  Pulse: 79  Weight: 280 lb 6.4 oz (127.189 kg)  SpO2: 98%    General:  Obese. No resp difficulty HEENT: normal Neck: supple. JVP unable to assess due to body habitus. Carotids 2+ bilaterally; no bruits. No lymphadenopathy or thryomegaly appreciated. Cor: PMI normal. Regular rate & rhythm. No rubs, gallops or murmurs. Lungs: clear Abdomen: obese, soft, nontender, nondistended. No hepatosplenomegaly. No bruits or masses. Good bowel sounds. Extremities: no cyanosis, clubbing, rash, edema Neuro: alert & orientedx3, cranial nerves grossly intact. Moves all 4 extremities w/o difficulty. Affect pleasant.   ECG: NSR with occasional PVC 71 bpm   ASSESSMENT & PLAN:

## 2012-02-24 ENCOUNTER — Encounter (HOSPITAL_COMMUNITY): Payer: Self-pay

## 2012-02-24 ENCOUNTER — Encounter (HOSPITAL_COMMUNITY): Payer: Self-pay | Admitting: *Deleted

## 2012-02-24 ENCOUNTER — Ambulatory Visit (HOSPITAL_COMMUNITY)
Admission: RE | Admit: 2012-02-24 | Discharge: 2012-02-24 | Disposition: A | Discharge status: Home / Self Care | Payer: BC Managed Care – PPO | Source: Ambulatory Visit | Attending: Internal Medicine | Admitting: Internal Medicine

## 2012-02-24 ENCOUNTER — Encounter: Payer: Self-pay | Admitting: Family Medicine

## 2012-02-24 ENCOUNTER — Ambulatory Visit (INDEPENDENT_AMBULATORY_CARE_PROVIDER_SITE_OTHER): Payer: BC Managed Care – PPO | Admitting: Family Medicine

## 2012-02-24 VITALS — BP 126/92 | HR 71 | Wt 273.1 lb

## 2012-02-24 VITALS — BP 118/76 | HR 76 | Temp 98.3°F | Ht 65.0 in | Wt 277.0 lb

## 2012-02-24 DIAGNOSIS — I5030 Unspecified diastolic (congestive) heart failure: Secondary | ICD-10-CM

## 2012-02-24 DIAGNOSIS — I10 Essential (primary) hypertension: Secondary | ICD-10-CM

## 2012-02-24 DIAGNOSIS — Z23 Encounter for immunization: Secondary | ICD-10-CM

## 2012-02-24 DIAGNOSIS — I5032 Chronic diastolic (congestive) heart failure: Secondary | ICD-10-CM

## 2012-02-24 DIAGNOSIS — E119 Type 2 diabetes mellitus without complications: Secondary | ICD-10-CM

## 2012-02-24 DIAGNOSIS — E041 Nontoxic single thyroid nodule: Secondary | ICD-10-CM

## 2012-02-24 MED ORDER — TORSEMIDE 20 MG PO TABS: ORAL_TABLET | ORAL | Status: DC

## 2012-02-24 MED ORDER — CARVEDILOL 3.125 MG PO TABS: 3.1250 mg | ORAL_TABLET | Freq: Two times a day (BID) | ORAL | Status: DC

## 2012-02-24 NOTE — Patient Instructions (Addendum)
Continue demadex for weight 275 pounds or more.    Follow up 6 weeks with Dr. Gala Romney.

## 2012-02-24 NOTE — Patient Instructions (Addendum)
Tdap vaccine today  Follow up in early feb with labs prior  We will do ENT consult at check out

## 2012-02-24 NOTE — Assessment & Plan Note (Signed)
Volume status looks good.  Will continue sliding scale torsemide for weight 275 or greater.  Re-educated on fluid restrictions and low sodium diet.  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  Have provided her with a note for work.

## 2012-02-24 NOTE — Assessment & Plan Note (Deleted)
Volume status looks good.  Will continue sliding scale torsemide for weight 275 or greater.  Re-educated on fluid restrictions and low sodium diet.

## 2012-02-24 NOTE — Progress Notes (Signed)
Weight Range   Baseline proBNP   PCP: Dr Lucretia Roers  Pulmonary: Dr. [name]  HPI: [name] is a 46 y/o F hx of DM, HTN, HL, morbid obesity, OSA noncompliant with CPAP due to finances, anxiety whose cardiac hx includes absence of coronary artery disease on cath in 2006 for CP with only catheter-induced spasm of RCA. EF was 65% at that time.   Echo 10/24: EF 50-55%. Grade 2 diastolic dysfunction. RV mildly dilated. PAPP 68 mmHg   Admitted to [facility] 01/25/12 for acute renal failure related to CT contrast. Creatinine trended back dow from 4.81>1.3. While hospitalized Demadex 40 mg bid,  Aldactone 25 mg daily, and bisoprolol stopped. Started carvedilol 3.125 mg bid.    Nuclear Stress 02/17/12: Normal, EF 58%  She returns for follow up today.  Feels well today.  She has been watching her weights closely and keeping them between 273-274.  She is only taking demadex if weight is 275 or greater.  Taking spironolactone daily.  No dizziness or syncope.  No edema.  Uses CPAP and O2 nightly.  Denies dyspnea.  Cleared by Dr. [name] and PCP for further thyroid work up.  Occasional chest pain but does not last long and non exertional -> stress test normal last week.  Wants to go back to work at [employer] in [employer].    ROS: All systems negative except as listed in HPI, PMH and Problem List.  Past Medical History  Diagnosis Date  . Anemia     iron deficiency  . Asthma     a. 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  . Depression     a. Stress reaction 08/2011 in multiple social stressors.  . Diabetes mellitus   . Hypertension   . Morbid obesity   . Hyperlipidemia   . Vertigo   . PCOS (polycystic ovarian syndrome)   . Gastritis   . Insomnia   . Complication of anesthesia     DIFFICULT WAKING  . GERD (gastroesophageal reflux disease)   . OSA (obstructive sleep apnea) 01/17/2012    Current Outpatient Prescriptions  Medication Sig Dispense  Refill  . albuterol (PROAIR HFA) 108 (90 BASE) MCG/ACT inhaler Inhale 2 puffs into the lungs every 4 (four) hours as needed. For wheezing  1 Inhaler  4  . buPROPion (WELLBUTRIN XL) 150 MG 24 hr tablet Take 150 mg by mouth daily.      . carvedilol (COREG) 3.125 MG tablet Take 1 tablet (3.125 mg total) by mouth 2 (two) times daily with a meal.  60 tablet  0  . cetirizine (ZYRTEC) 10 MG tablet Take 10 mg by mouth as needed.       . ferrous sulfate 325 (65 FE) MG tablet Take 325 mg by mouth 2 (two) times daily.        . fluticasone (FLONASE) 50 MCG/ACT nasal spray Place 2 sprays into the nose daily.  16 g  0  . pantoprazole (PROTONIX) 40 MG tablet Take 40 mg by mouth daily.      . sertraline (ZOLOFT) 50 MG tablet Take 2 tablets (100 mg total) by mouth daily.  30 tablet  11  . simvastatin (ZOCOR) 20 MG tablet Take 20 mg by mouth every evening.      Marland Kitchen spironolactone (ALDACTONE) 25 MG tablet Take 1 tablet (25 mg total) by mouth daily.  30 tablet  3  . torsemide (DEMADEX) 20 MG tablet Take 1 tablet (20 mg  total) by mouth daily.  30 tablet  3  . zolpidem (AMBIEN) 10 MG tablet Take 0.5-1 tablets (5-10 mg total) by mouth at bedtime as needed. For sleep  30 tablet  5     PHYSICAL EXAM: Filed Vitals:   02/24/12 0911  BP: 126/92  Pulse: 71  Weight: 273 lb 1.9 oz (123.886 kg)  SpO2: 97%    General:  Obese. No resp difficulty HEENT: normal Neck: supple. JVP unable to assess due to body habitus. Carotids 2+ bilaterally; no bruits. No lymphadenopathy or thryomegaly appreciated. Cor: PMI normal. Regular rate & rhythm. No rubs, gallops or murmurs. Lungs: clear Abdomen: obese, soft, nontender, nondistended. No hepatosplenomegaly. No bruits or masses. Good bowel sounds. Extremities: no cyanosis, clubbing, rash, edema Neuro: alert & orientedx3, cranial nerves grossly intact. Moves all 4 extremities w/o difficulty. Affect pleasant.      ASSESSMENT & PLAN:

## 2012-02-24 NOTE — Progress Notes (Signed)
Subjective:    Patient ID: [name], female    DOB: September 12, 1965, 46 y.o.   MRN: [id number]  HPI Here for f/u of chronic conditions  Last visit was hosp f/u  CHF from sleep apnea  cardiol visit- saw the PA and rev her stress test - everything looked good  Disc her chest discomfort - and did not think it was rel to angina or chf Also saw Dr [name] -- CPAP- feels better - does have to wear 02 with the cpap  Is walking at least a mile per day   bp is stable today  No cp or palpitations or headaches or edema  No side effects to medicines  BP Readings from Last 3 Encounters:  02/24/12 118/76  02/24/12 126/92  02/18/12 124/72    Doing well with current medicines   Wt is up 4 lb  Needs endo ref for goiter/ thyroid nodule Will go ahead with that  Wants to see dr Annalee Genta  Lab Results  Component Value Date   TSH 1.528 01/18/2012     Mood has been good  No depression-feels upbeat and energetic and motivated now   Sugars are good  Generally in the 140s-- 14 day avg 139 As low as 97 Highest one time 211 Lab Results  Component Value Date   HGBA1C 9.0* 01/17/2012   is eating better Smaller portions and better foods     Patient Active Problem List  Diagnosis  . THYROID DISORDER  . DIABETES MELLITUS, TYPE II  . POLYCYSTIC OVARIES  . HYPERLIPIDEMIA  . ANEMIA-IRON DEFICIENCY  . DEPRESSION  . OTHER ORGANIC SLEEP DISORDERS  . HYPERTENSION  . HEMORRHOIDS, INTERNAL, WITH BLEEDING  . EXTERNAL HEMORRHOIDS  . ASTHMA  . GERD  . OBESITY, MORBID  . Chronic allergic rhinitis  . Insomnia  . Stress reaction  . Hypertensive urgency  . CHF (congestive heart failure)  . Chest pain  . Anxiety  . Acute diastolic heart failure  . OSA (obstructive sleep apnea) just got cpap at home this past week 01/17/12  . Hypokalemia  . Acute bronchitis  . Thyroid nodule  . Acute renal failure  . Nausea & vomiting  . Diarrhea  . Diastolic CHF  . Hyponatremia  . UTI (lower urinary  tract infection)  . Chronic diastolic heart failure   Past Medical History  Diagnosis Date  . Anemia     iron deficiency  . Asthma     a. 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  . Depression     a. Stress reaction 08/2011 in multiple social stressors.  . Diabetes mellitus   . Hypertension   . Morbid obesity   . Hyperlipidemia   . Vertigo   . PCOS (polycystic ovarian syndrome)   . Gastritis   . Insomnia   . Complication of anesthesia     DIFFICULT WAKING  . GERD (gastroesophageal reflux disease)   . OSA (obstructive sleep apnea) 01/17/2012   Past Surgical History  Procedure Date  . Cholecystectomy   . Hemorrhoid surgery   . Tubal ligation   . Shoulder surgery 06/2009  . Tonsillectomy and adenoidectomy CHILD  . Intrauterine device insertion 2011   History  Substance Use Topics  . Smoking status: Former Smoker -- 0.2 packs/day for 5 years    Quit date: 03/25/2004  . Smokeless tobacco: Never Used  . Alcohol Use: No   Family History  Problem Relation Age of Onset  .  Cancer Father     colon cancer  . Arthritis Father   . Arthritis Mother   . Asthma Mother   . Hypertension Mother    Allergies  Allergen Reactions  . Buspirone Hcl Nausea Only  . Glipizide     REACTION: sleepy, low sugar  . Ivp Dye (Iodinated Diagnostic Agents)     Shut kidneys down  . Oxycodone-Acetaminophen Itching   Current Outpatient Prescriptions on File Prior to Visit  Medication Sig Dispense Refill  . albuterol (PROAIR HFA) 108 (90 BASE) MCG/ACT inhaler Inhale 2 puffs into the lungs every 4 (four) hours as needed. For wheezing  1 Inhaler  4  . buPROPion (WELLBUTRIN XL) 150 MG 24 hr tablet Take 150 mg by mouth daily.      . carvedilol (COREG) 3.125 MG tablet Take 1 tablet (3.125 mg total) by mouth 2 (two) times daily with a meal.  60 tablet  3  . cetirizine (ZYRTEC) 10 MG tablet Take 10 mg by mouth as needed.       . ferrous sulfate 325 (65 FE) MG tablet  Take 325 mg by mouth 2 (two) times daily.        . fluticasone (FLONASE) 50 MCG/ACT nasal spray Place 2 sprays into the nose daily.  16 g  0  . insulin glargine (LANTUS) 100 UNIT/ML injection Inject 30 Units into the skin at bedtime.      . pantoprazole (PROTONIX) 40 MG tablet Take 40 mg by mouth daily.      . sertraline (ZOLOFT) 50 MG tablet Take 2 tablets (100 mg total) by mouth daily.  30 tablet  11  . simvastatin (ZOCOR) 20 MG tablet Take 20 mg by mouth every evening.      Marland Kitchen spironolactone (ALDACTONE) 25 MG tablet Take 1 tablet (25 mg total) by mouth daily.  30 tablet  3  . torsemide (DEMADEX) 20 MG tablet Daily for weight 275 or greater.  30 tablet  3  . zolpidem (AMBIEN) 10 MG tablet Take 0.5-1 tablets (5-10 mg total) by mouth at bedtime as needed. For sleep  30 tablet  5    Review of Systems    Review of Systems  Constitutional: Negative for fever, appetite change, fatigue and unexpected weight change.  Eyes: Negative for pain and visual disturbance.  Respiratory: Negative for cough and shortness of breath.   Cardiovascular: Negative for cp or palpitations   neg for PND or orthopnea  Gastrointestinal: Negative for nausea, diarrhea and constipation.  Genitourinary: Negative for urgency and frequency.  Skin: Negative for pallor or rash   Neurological: Negative for weakness, light-headedness, numbness and headaches.  Hematological: Negative for adenopathy. Does not bruise/bleed easily.  Psychiatric/Behavioral: Negative for dysphoric mood. The patient is not nervous/anxious.      Objective:   Physical Exam  Constitutional: She appears well-developed and well-nourished. No distress.       obese and well appearing    HENT:  Head: Normocephalic and atraumatic.  Mouth/Throat: Oropharynx is clear and moist.  Eyes: Conjunctivae normal and EOM are normal. Pupils are equal, round, and reactive to light. No scleral icterus.  Neck: Normal range of motion. Neck supple. No JVD present.  Carotid bruit is not present. No thyromegaly present.  Cardiovascular: Normal rate, regular rhythm, normal heart sounds and intact distal pulses.  Exam reveals no gallop.   Pulmonary/Chest: Effort normal and breath sounds normal. No respiratory distress. She has no wheezes.  Abdominal: Soft. Bowel sounds are normal. She exhibits  no distension, no abdominal bruit and no mass. There is no tenderness.  Musculoskeletal: She exhibits no edema.  Lymphadenopathy:    She has no cervical adenopathy.  Neurological: She is alert. She has normal reflexes. No cranial nerve deficit. She exhibits normal muscle tone. Coordination normal.  Skin: Skin is warm and dry. No rash noted. No erythema. No pallor.  Psychiatric: She has a normal mood and affect.       Good mood           Assessment & Plan:

## 2012-02-24 NOTE — Assessment & Plan Note (Signed)
Controlled. Continue current regimen. 

## 2012-02-27 NOTE — Assessment & Plan Note (Signed)
bp in fair control at this time  No changes needed  Disc lifstyle change with low sodium diet and exercise  Much improved with tx of sleep apnea  Pt plans to work on wt loss now  F/u feb

## 2012-02-27 NOTE — Assessment & Plan Note (Signed)
Lab Results  Component Value Date   HGBA1C 9.0* 01/17/2012    Now diet much better and sugars are as well  Will re check a1c in feb and f/u  No symptoms  Disc plan for wt loss

## 2012-02-27 NOTE — Assessment & Plan Note (Signed)
Ref to ENT for eval of goiter/ nodule found in hospital tsh is ok  Lab Results  Component Value Date   TSH 1.528 01/18/2012   no symptoms

## 2012-03-05 ENCOUNTER — Telehealth: Payer: Self-pay

## 2012-03-05 NOTE — Telephone Encounter (Signed)
Yes, it she can- it was primarily to track her diabetes

## 2012-03-05 NOTE — Telephone Encounter (Signed)
Advise pt to keep appts

## 2012-03-05 NOTE — Telephone Encounter (Signed)
Pt left v/m pt was referred to ENT by Dr Milinda Antis and pt scheduled to have removal of thyroid nodule 04/10/12. Does Dr Milinda Antis want pt to keep 04/20/12 for labs and f/u appt with Dr Milinda Antis on 04/27/12.Please advise.

## 2012-03-25 DIAGNOSIS — C73 Malignant neoplasm of thyroid gland: Secondary | ICD-10-CM

## 2012-03-25 HISTORY — DX: Malignant neoplasm of thyroid gland: C73

## 2012-03-30 ENCOUNTER — Other Ambulatory Visit: Payer: Self-pay | Admitting: Family Medicine

## 2012-03-30 ENCOUNTER — Encounter (HOSPITAL_COMMUNITY): Payer: Self-pay | Admitting: Pharmacy Technician

## 2012-04-02 ENCOUNTER — Ambulatory Visit (HOSPITAL_COMMUNITY)
Admission: RE | Admit: 2012-04-02 | Discharge: 2012-04-02 | Disposition: A | Discharge status: Home / Self Care | Payer: BC Managed Care – PPO | Source: Ambulatory Visit | Attending: Internal Medicine | Admitting: Internal Medicine

## 2012-04-02 VITALS — BP 136/76 | HR 70 | Wt 264.5 lb

## 2012-04-02 DIAGNOSIS — Z01810 Encounter for preprocedural cardiovascular examination: Secondary | ICD-10-CM | POA: Insufficient documentation

## 2012-04-02 DIAGNOSIS — I5032 Chronic diastolic (congestive) heart failure: Secondary | ICD-10-CM

## 2012-04-02 NOTE — Progress Notes (Signed)
Patient ID: CHANNA HAZELETT, female   DOB: 1965-08-09, 47 y.o.   MRN: [id number]  Weight Range   Baseline proBNP   PCP: Dr Lucretia Roers  Pulmonary: Dr. [name] ENT: Dr Lendon Collar  HPI: Ms. Abbasi is a 47 y/o F hx of DM, HTN, HL, morbid obesity, OSA noncompliant with CPAP due to finances, anxiety whose cardiac hx includes absence of coronary artery disease on cath in 2006 for CP with only catheter-induced spasm of RCA. EF was 65% at that time.   Echo 10/24: EF 50-55%. Grade 2 diastolic dysfunction. RV mildly dilated. PAPP 68 mmHg   Admitted to [facility] 01/25/12 for acute renal failure related to CT contrast. Creatinine trended back dow from 4.81>1.3. While hospitalized Demadex 40 mg bid,  Aldactone 25 mg daily, and bisoprolol stopped. Started carvedilol 3.125 mg bid.    Nuclear Stress 02/17/12: Normal, EF 58%  She returns for follow up today.  Feels good. Increased energy. Denies SOB/PND/Orthopnea. Weight at home 261 pounds. Continues to use CPAP. Feels better on CPAP. Plan for Thyriodectomy (nodular goiter compressing trachea) 04/10/12. Continues to work full time at [employer]. Occasional LE edema worse at night - resolves in am.    ROS: All systems negative except as listed in HPI, PMH and Problem List.  Past Medical History  Diagnosis Date  . Anemia     iron deficiency  . Asthma     a. 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  . Depression     a. Stress reaction 08/2011 in multiple social stressors.  . Diabetes mellitus   . Hypertension   . Morbid obesity   . Hyperlipidemia   . Vertigo   . PCOS (polycystic ovarian syndrome)   . Gastritis   . Insomnia   . Complication of anesthesia     DIFFICULT WAKING  . GERD (gastroesophageal reflux disease)   . OSA (obstructive sleep apnea) 01/17/2012    Current Outpatient Prescriptions  Medication Sig Dispense Refill  . albuterol (PROAIR HFA) 108 (90 BASE) MCG/ACT inhaler Inhale 2 puffs into the lungs  every 4 (four) hours as needed. For wheezing  1 Inhaler  4  . buPROPion (WELLBUTRIN XL) 150 MG 24 hr tablet Take 150 mg by mouth daily.      . carvedilol (COREG) 3.125 MG tablet Take 1 tablet (3.125 mg total) by mouth 2 (two) times daily with a meal.  60 tablet  3  . ferrous sulfate 325 (65 FE) MG tablet Take 325 mg by mouth 2 (two) times daily.        . fluticasone (FLONASE) 50 MCG/ACT nasal spray Place 2 sprays into the nose daily as needed. For allergies      . insulin glargine (LANTUS) 100 UNIT/ML injection Inject 30 Units into the skin at bedtime.      Marland Kitchen loratadine (CLARITIN) 10 MG tablet Take 10 mg by mouth daily.      . ONE TOUCH ULTRA TEST test strip USE AS DIRECTED TO CHECK BLOOD SUGAR TWICE DAILY  100 each  1  . pantoprazole (PROTONIX) 40 MG tablet Take 40 mg by mouth daily.      . sertraline (ZOLOFT) 50 MG tablet Take 2 tablets (100 mg total) by mouth daily.  30 tablet  11  . simvastatin (ZOCOR) 20 MG tablet Take 20 mg by mouth every evening.      Marland Kitchen spironolactone (ALDACTONE) 25 MG tablet Take 1 tablet (25 mg total) by mouth daily.  30 tablet  3  . torsemide (DEMADEX) 20 MG tablet Daily for weight 275 or greater.  30 tablet  3  . zolpidem (AMBIEN) 10 MG tablet Take 0.5-1 tablets (5-10 mg total) by mouth at bedtime as needed. For sleep  30 tablet  5     PHYSICAL EXAM: Filed Vitals:   04/02/12 0945  BP: 136/76  Pulse: 70  Weight: 264 lb 8 oz (119.976 kg)  SpO2: 96%    General:  Obese. No resp difficulty HEENT: normal Neck: supple. JVP unable to assess due to body habitus.Arppears flat Carotids 2+ bilaterally; no bruits. No lymphadenopathy or thryomegaly appreciated. Cor: PMI normal. Regular rate & rhythm. No rubs, gallops or murmurs. Lungs: clear Abdomen: obese, soft, nontender, nondistended. No hepatosplenomegaly. No bruits or masses. Good bowel sounds. Extremities: no cyanosis, clubbing, rash, edema Neuro: alert & orientedx3, cranial nerves grossly intact. Moves all 4  extremities w/o difficulty. Affect pleasant.      ASSESSMENT & PLAN:

## 2012-04-02 NOTE — Patient Instructions (Addendum)
We will contact you in 3 months with an echocardiogram to schedule your next appointment.

## 2012-04-02 NOTE — Assessment & Plan Note (Signed)
Doing much better. Volume status looks great. Compliant with CPAP. Did have evidence of moderate pulmonary HTN on previous echo but I suspect this is much better after diuresis and initiation of CPAP. Will check echo at next visit. If pulmonary pressures still significantly elevated may have to consider RHC.

## 2012-04-02 NOTE — Assessment & Plan Note (Signed)
Doing well. Stress test normal. I feel she is at low risk for peri-operative cardiac complications and can proceed with surgery from our standpoint without further cardiac testing. We will be available during that time should any issues arise.

## 2012-04-03 ENCOUNTER — Other Ambulatory Visit: Payer: Self-pay | Admitting: Otolaryngology

## 2012-04-04 ENCOUNTER — Other Ambulatory Visit: Payer: Self-pay | Admitting: Family Medicine

## 2012-04-06 ENCOUNTER — Encounter (HOSPITAL_COMMUNITY): Payer: Self-pay

## 2012-04-06 ENCOUNTER — Encounter (HOSPITAL_COMMUNITY)
Admission: RE | Admit: 2012-04-06 | Discharge: 2012-04-06 | Disposition: A | Discharge status: Home / Self Care | Payer: BC Managed Care – PPO | Source: Ambulatory Visit | Attending: Otolaryngology | Admitting: Otolaryngology

## 2012-04-06 HISTORY — DX: Cardiac arrhythmia, unspecified: I49.9

## 2012-04-06 HISTORY — DX: Unspecified hemorrhoids: K64.9

## 2012-04-06 HISTORY — DX: Chronic kidney disease, unspecified: N18.9

## 2012-04-06 HISTORY — DX: Anxiety disorder, unspecified: F41.9

## 2012-04-06 HISTORY — DX: Unspecified osteoarthritis, unspecified site: M19.90

## 2012-04-06 HISTORY — DX: Heart failure, unspecified: I50.9

## 2012-04-06 HISTORY — DX: Thyrotoxicosis, unspecified without thyrotoxic crisis or storm: E05.90

## 2012-04-06 LAB — BASIC METABOLIC PANEL
Chloride: 100 mEq/L (ref 96–112)
GFR calc Af Amer: >90 mL/min (ref >90)
Potassium: 3.7 mEq/L (ref 3.5–5.1)
Sodium: 140 mEq/L (ref 135–145)

## 2012-04-06 LAB — SURGICAL PCR SCREEN
MRSA, PCR: NEGATIVE
Staphylococcus aureus: POSITIVE — AB

## 2012-04-06 LAB — CBC
HCT: 39.6 % (ref 36.0–46.0)
Hemoglobin: 12.6 g/dL (ref 12.0–15.0)
RBC: 4.66 MIL/uL (ref 3.87–5.11)
WBC: 10.3 10*3/uL (ref 4.0–10.5)

## 2012-04-06 NOTE — Telephone Encounter (Signed)
done

## 2012-04-06 NOTE — Telephone Encounter (Signed)
Please refil for 12 months, thanks  

## 2012-04-06 NOTE — Progress Notes (Addendum)
Pt said she has had difficulty waking up after surgery in the past. Last surgery was in 2011 at [facility] out patient surgery.  Pt was tested for sleep apnea and now wears CPAP with Oxygen every night.  Sleep study was done at [facility].  I will have Revonda Standard review chart.

## 2012-04-06 NOTE — Pre-Procedure Instructions (Signed)
KHANDI KERNES  04/06/2012   Your procedure is scheduled on: Friday, January 17th.  Report to [facility] at 0645 AM.    Call this number if you have problems the morning of surgery: [phone]   Remember:   Do not eat food or drink liquids after midnight.    Take these medicines the morning of surgery with A SIP OF WATER: Bupropion (Wellbutrin XL), Carvedilol (Coreg), Loratadine (CLaritin), Pantoprazole (Protonix).  Use inhalers  and nasal spray.  Bring Albuterol inhaler to the hosptial. with you.   Do not wear jewelry, make-up or nail polish.  Do not wear lotions, powders, or perfumes. You may wear deodorant.  Do not shave 48 hours prior to surgery. Men may shave face and neck.  Do not bring valuables to the hospital.  Contacts, dentures or bridgework may not be worn into surgery.  Leave suitcase in the car. After surgery it may be brought to your room.  For patients admitted to the hospital, checkout time is 11:00 AM the day of  discharge.   Patients discharged the day of surgery will not be allowed to drive  home.  Name and phone number of your driver: To be admitted.   Special Instructions: Shower using CHG 2 nights before surgery and the night before surgery.  If you shower the day of surgery use CHG.  Use special wash - you have one bottle of CHG for all showers.  You should use approximately 1/3 of the bottle for each shower.   Please read over the following fact sheets that you were given: Pain Booklet, Coughing and Deep Breathing and Surgical Site Infection Prevention

## 2012-04-06 NOTE — Telephone Encounter (Signed)
Ok to refill 

## 2012-04-09 ENCOUNTER — Encounter (HOSPITAL_COMMUNITY): Payer: BC Managed Care – PPO

## 2012-04-09 MED ORDER — CEFAZOLIN SODIUM-DEXTROSE 2-3 GM-% IV SOLR
2.0000 g | INTRAVENOUS | Status: AC
  Administered 2012-04-10: 2 g via INTRAVENOUS
  Filled 2012-04-09: qty 50

## 2012-04-10 ENCOUNTER — Inpatient Hospital Stay (HOSPITAL_COMMUNITY): Payer: BC Managed Care – PPO | Admitting: Critical Care Medicine

## 2012-04-10 ENCOUNTER — Encounter (HOSPITAL_COMMUNITY): Payer: Self-pay | Admitting: Surgery

## 2012-04-10 ENCOUNTER — Encounter (HOSPITAL_COMMUNITY)
Admission: RE | Disposition: A | Discharge status: Home / Self Care | Payer: Self-pay | Source: Ambulatory Visit | Attending: Otolaryngology

## 2012-04-10 ENCOUNTER — Inpatient Hospital Stay (HOSPITAL_COMMUNITY)
Admission: RE | Admit: 2012-04-10 | Discharge: 2012-04-12 | DRG: 290 | Disposition: A | Discharge status: Home / Self Care | Payer: BC Managed Care – PPO | Source: Ambulatory Visit | Attending: Otolaryngology | Admitting: Otolaryngology

## 2012-04-10 ENCOUNTER — Encounter (HOSPITAL_COMMUNITY): Payer: Self-pay | Admitting: Critical Care Medicine

## 2012-04-10 DIAGNOSIS — Z6841 Body Mass Index (BMI) 40.0 and over, adult: Secondary | ICD-10-CM

## 2012-04-10 DIAGNOSIS — I2789 Other specified pulmonary heart diseases: Secondary | ICD-10-CM | POA: Diagnosis present

## 2012-04-10 DIAGNOSIS — K219 Gastro-esophageal reflux disease without esophagitis: Secondary | ICD-10-CM | POA: Diagnosis present

## 2012-04-10 DIAGNOSIS — F411 Generalized anxiety disorder: Secondary | ICD-10-CM | POA: Diagnosis present

## 2012-04-10 DIAGNOSIS — J398 Other specified diseases of upper respiratory tract: Secondary | ICD-10-CM | POA: Diagnosis present

## 2012-04-10 DIAGNOSIS — F329 Major depressive disorder, single episode, unspecified: Secondary | ICD-10-CM | POA: Diagnosis present

## 2012-04-10 DIAGNOSIS — I739 Peripheral vascular disease, unspecified: Secondary | ICD-10-CM | POA: Diagnosis present

## 2012-04-10 DIAGNOSIS — I129 Hypertensive chronic kidney disease with stage 1 through stage 4 chronic kidney disease, or unspecified chronic kidney disease: Secondary | ICD-10-CM | POA: Diagnosis present

## 2012-04-10 DIAGNOSIS — I509 Heart failure, unspecified: Secondary | ICD-10-CM | POA: Diagnosis present

## 2012-04-10 DIAGNOSIS — N189 Chronic kidney disease, unspecified: Secondary | ICD-10-CM | POA: Diagnosis present

## 2012-04-10 DIAGNOSIS — E119 Type 2 diabetes mellitus without complications: Secondary | ICD-10-CM | POA: Diagnosis present

## 2012-04-10 DIAGNOSIS — F3289 Other specified depressive episodes: Secondary | ICD-10-CM | POA: Diagnosis present

## 2012-04-10 DIAGNOSIS — Z87891 Personal history of nicotine dependence: Secondary | ICD-10-CM

## 2012-04-10 DIAGNOSIS — E0500 Thyrotoxicosis with diffuse goiter without thyrotoxic crisis or storm: Principal | ICD-10-CM | POA: Diagnosis present

## 2012-04-10 DIAGNOSIS — E079 Disorder of thyroid, unspecified: Secondary | ICD-10-CM

## 2012-04-10 DIAGNOSIS — G4733 Obstructive sleep apnea (adult) (pediatric): Secondary | ICD-10-CM | POA: Diagnosis present

## 2012-04-10 DIAGNOSIS — J45909 Unspecified asthma, uncomplicated: Secondary | ICD-10-CM | POA: Diagnosis present

## 2012-04-10 HISTORY — PX: THYROIDECTOMY: SHX17

## 2012-04-10 LAB — GLUCOSE, CAPILLARY
Glucose-Capillary: 121 mg/dL — ABNORMAL HIGH (ref 70–99)
Glucose-Capillary: 144 mg/dL — ABNORMAL HIGH (ref 70–99)
Glucose-Capillary: 144 mg/dL — ABNORMAL HIGH (ref 70–99)

## 2012-04-10 LAB — HCG, SERUM, QUALITATIVE: Preg, Serum: NEGATIVE

## 2012-04-10 SURGERY — THYROIDECTOMY
Anesthesia: General | Site: Neck | Wound class: Clean

## 2012-04-10 MED ORDER — LIDOCAINE-EPINEPHRINE 1 %-1:100000 IJ SOLN
INTRAMUSCULAR | Status: AC
  Filled 2012-04-10: qty 1

## 2012-04-10 MED ORDER — FENTANYL CITRATE 0.05 MG/ML IJ SOLN
INTRAMUSCULAR | Status: DC | PRN
  Administered 2012-04-10 (×2): 50 ug via INTRAVENOUS
  Administered 2012-04-10: 150 ug via INTRAVENOUS
  Administered 2012-04-10: 50 ug via INTRAVENOUS

## 2012-04-10 MED ORDER — DEXAMETHASONE SODIUM PHOSPHATE 10 MG/ML IJ SOLN
INTRAMUSCULAR | Status: AC
  Filled 2012-04-10: qty 1

## 2012-04-10 MED ORDER — ONDANSETRON HCL 4 MG/2ML IJ SOLN
INTRAMUSCULAR | Status: AC
  Filled 2012-04-10: qty 2

## 2012-04-10 MED ORDER — HYDROCODONE-ACETAMINOPHEN 5-325 MG PO TABS
1.0000 | ORAL_TABLET | ORAL | Status: DC | PRN
  Administered 2012-04-10 – 2012-04-12 (×4): 2 via ORAL
  Filled 2012-04-10 (×4): qty 2

## 2012-04-10 MED ORDER — BUPROPION HCL ER (XL) 150 MG PO TB24
150.0000 mg | ORAL_TABLET | Freq: Every day | ORAL | Status: DC
  Administered 2012-04-10 – 2012-04-12 (×3): 150 mg via ORAL
  Filled 2012-04-10 (×3): qty 1

## 2012-04-10 MED ORDER — SIMVASTATIN 20 MG PO TABS
20.0000 mg | ORAL_TABLET | Freq: Every evening | ORAL | Status: DC
  Administered 2012-04-10 – 2012-04-11 (×2): 20 mg via ORAL
  Filled 2012-04-10 (×3): qty 1

## 2012-04-10 MED ORDER — ONDANSETRON HCL 4 MG/2ML IJ SOLN
4.0000 mg | Freq: Once | INTRAMUSCULAR | Status: AC | PRN
  Administered 2012-04-10: 4 mg via INTRAVENOUS

## 2012-04-10 MED ORDER — INSULIN GLARGINE 100 UNIT/ML ~~LOC~~ SOLN
30.0000 [IU] | Freq: Every day | SUBCUTANEOUS | Status: DC
  Administered 2012-04-10 – 2012-04-11 (×2): 30 [IU] via SUBCUTANEOUS

## 2012-04-10 MED ORDER — LACTATED RINGERS IV SOLN
INTRAVENOUS | Status: DC
  Administered 2012-04-10 (×2): via INTRAVENOUS

## 2012-04-10 MED ORDER — ARTIFICIAL TEARS OP OINT
TOPICAL_OINTMENT | OPHTHALMIC | Status: DC | PRN
  Administered 2012-04-10: 1 via OPHTHALMIC

## 2012-04-10 MED ORDER — CALCIUM-VITAMIN D 250-125 MG-UNIT PO TABS: 1.0000 | ORAL_TABLET | Freq: Three times a day (TID) | ORAL | Status: DC

## 2012-04-10 MED ORDER — BACITRACIN ZINC 500 UNIT/GM EX OINT
TOPICAL_OINTMENT | CUTANEOUS | Status: AC
  Filled 2012-04-10: qty 15

## 2012-04-10 MED ORDER — CALCIUM CARBONATE-VITAMIN D 500-200 MG-UNIT PO TABS
1.0000 | ORAL_TABLET | Freq: Three times a day (TID) | ORAL | Status: DC
  Administered 2012-04-10 – 2012-04-12 (×6): 1 via ORAL
  Filled 2012-04-10 (×8): qty 1

## 2012-04-10 MED ORDER — ACETAMINOPHEN 10 MG/ML IV SOLN
1000.0000 mg | Freq: Once | INTRAVENOUS | Status: AC | PRN
  Administered 2012-04-10: 1000 mg via INTRAVENOUS
  Filled 2012-04-10: qty 100

## 2012-04-10 MED ORDER — ONDANSETRON HCL 4 MG/2ML IJ SOLN
INTRAMUSCULAR | Status: DC | PRN
  Administered 2012-04-10: 4 mg via INTRAVENOUS

## 2012-04-10 MED ORDER — MORPHINE SULFATE 2 MG/ML IJ SOLN
2.0000 mg | INTRAMUSCULAR | Status: DC | PRN
  Administered 2012-04-10: 2 mg via INTRAVENOUS
  Filled 2012-04-10: qty 1

## 2012-04-10 MED ORDER — MIDAZOLAM HCL 5 MG/5ML IJ SOLN
INTRAMUSCULAR | Status: DC | PRN
  Administered 2012-04-10: 2 mg via INTRAVENOUS

## 2012-04-10 MED ORDER — INSULIN ASPART 100 UNIT/ML ~~LOC~~ SOLN
0.0000 [IU] | Freq: Three times a day (TID) | SUBCUTANEOUS | Status: DC
Start: 2012-04-10 — End: 2012-04-12
  Administered 2012-04-10: 4 [IU] via SUBCUTANEOUS
  Administered 2012-04-11 – 2012-04-12 (×3): 3 [IU] via SUBCUTANEOUS
  Administered 2012-04-12: 4 [IU] via SUBCUTANEOUS

## 2012-04-10 MED ORDER — SPIRONOLACTONE 25 MG PO TABS
25.0000 mg | ORAL_TABLET | Freq: Every day | ORAL | Status: DC
  Administered 2012-04-10 – 2012-04-12 (×3): 25 mg via ORAL
  Filled 2012-04-10 (×3): qty 1

## 2012-04-10 MED ORDER — HYDROCODONE-ACETAMINOPHEN 5-325 MG PO TABS: 1.0000 | ORAL_TABLET | Freq: Four times a day (QID) | ORAL | Status: DC | PRN

## 2012-04-10 MED ORDER — ONDANSETRON HCL 4 MG PO TABS: 4.0000 mg | ORAL_TABLET | ORAL | Status: DC | PRN

## 2012-04-10 MED ORDER — TORSEMIDE 20 MG PO TABS
20.0000 mg | ORAL_TABLET | Freq: Once | ORAL | Status: AC
  Administered 2012-04-10: 20 mg via ORAL
  Filled 2012-04-10: qty 1

## 2012-04-10 MED ORDER — MUPIROCIN 2 % EX OINT
TOPICAL_OINTMENT | CUTANEOUS | Status: AC
  Administered 2012-04-10: 1 via NASAL
  Filled 2012-04-10: qty 22

## 2012-04-10 MED ORDER — INSULIN GLARGINE 100 UNIT/ML ~~LOC~~ SOLN
30.0000 [IU] | Freq: Every day | SUBCUTANEOUS | Status: DC
Start: 2012-04-10 — End: 2012-04-10

## 2012-04-10 MED ORDER — SERTRALINE HCL 50 MG PO TABS
50.0000 mg | ORAL_TABLET | Freq: Every day | ORAL | Status: DC
  Administered 2012-04-10 – 2012-04-12 (×3): 50 mg via ORAL
  Filled 2012-04-10 (×3): qty 1

## 2012-04-10 MED ORDER — MUPIROCIN 2 % EX OINT
TOPICAL_OINTMENT | Freq: Two times a day (BID) | CUTANEOUS | Status: DC
Start: 2012-04-10 — End: 2012-04-10
  Administered 2012-04-10: 1 via NASAL

## 2012-04-10 MED ORDER — HYDROMORPHONE HCL PF 1 MG/ML IJ SOLN
0.2500 mg | INTRAMUSCULAR | Status: DC | PRN
  Administered 2012-04-10: 0.5 mg via INTRAVENOUS

## 2012-04-10 MED ORDER — LIDOCAINE HCL (CARDIAC) 20 MG/ML IV SOLN
INTRAVENOUS | Status: DC | PRN
  Administered 2012-04-10: 30 mg via INTRAVENOUS

## 2012-04-10 MED ORDER — CARVEDILOL 3.125 MG PO TABS
3.1250 mg | ORAL_TABLET | Freq: Two times a day (BID) | ORAL | Status: DC
  Administered 2012-04-10 – 2012-04-12 (×4): 3.125 mg via ORAL
  Filled 2012-04-10 (×6): qty 1

## 2012-04-10 MED ORDER — HEPARIN SODIUM (PORCINE) 5000 UNIT/ML IJ SOLN
5000.0000 [IU] | Freq: Three times a day (TID) | INTRAMUSCULAR | Status: DC
  Administered 2012-04-10 – 2012-04-12 (×6): 5000 [IU] via SUBCUTANEOUS
  Filled 2012-04-10 (×9): qty 1

## 2012-04-10 MED ORDER — DEXAMETHASONE SODIUM PHOSPHATE 10 MG/ML IJ SOLN
10.0000 mg | Freq: Once | INTRAMUSCULAR | Status: AC
  Administered 2012-04-10: 10 mg via INTRAVENOUS

## 2012-04-10 MED ORDER — HYDROMORPHONE HCL PF 1 MG/ML IJ SOLN
INTRAMUSCULAR | Status: AC
  Filled 2012-04-10: qty 1

## 2012-04-10 MED ORDER — LEVOTHYROXINE SODIUM 100 MCG PO TABS
100.0000 ug | ORAL_TABLET | Freq: Every day | ORAL | Status: DC
  Administered 2012-04-11 – 2012-04-12 (×2): 100 ug via ORAL
  Filled 2012-04-10 (×3): qty 1

## 2012-04-10 MED ORDER — PANTOPRAZOLE SODIUM 40 MG PO TBEC
40.0000 mg | DELAYED_RELEASE_TABLET | Freq: Every day | ORAL | Status: DC
  Administered 2012-04-10 – 2012-04-12 (×3): 40 mg via ORAL
  Filled 2012-04-10 (×3): qty 1

## 2012-04-10 MED ORDER — ONDANSETRON HCL 4 MG/2ML IJ SOLN: 4.0000 mg | INTRAMUSCULAR | Status: DC | PRN

## 2012-04-10 MED ORDER — KCL IN DEXTROSE-NACL 20-5-0.45 MEQ/L-%-% IV SOLN
INTRAVENOUS | Status: DC
  Administered 2012-04-10: 19:00:00 via INTRAVENOUS
  Administered 2012-04-11: 75 mL/h via INTRAVENOUS
  Filled 2012-04-10 (×3): qty 1000

## 2012-04-10 MED ORDER — PROPOFOL 10 MG/ML IV BOLUS
INTRAVENOUS | Status: DC | PRN
  Administered 2012-04-10 (×3): 20 mg via INTRAVENOUS
  Administered 2012-04-10: 180 mg via INTRAVENOUS
  Administered 2012-04-10 (×2): 20 mg via INTRAVENOUS

## 2012-04-10 MED ORDER — FERROUS SULFATE 325 (65 FE) MG PO TABS
325.0000 mg | ORAL_TABLET | Freq: Two times a day (BID) | ORAL | Status: DC
  Administered 2012-04-10 – 2012-04-12 (×5): 325 mg via ORAL
  Filled 2012-04-10 (×7): qty 1

## 2012-04-10 MED ORDER — ZOLPIDEM TARTRATE 5 MG PO TABS: 5.0000 mg | ORAL_TABLET | Freq: Every evening | ORAL | Status: DC | PRN

## 2012-04-10 MED ORDER — ACETAMINOPHEN 650 MG RE SUPP: 650.0000 mg | RECTAL | Status: DC | PRN

## 2012-04-10 MED ORDER — LIDOCAINE-EPINEPHRINE 1 %-1:100000 IJ SOLN
INTRAMUSCULAR | Status: DC | PRN
  Administered 2012-04-10: 4 mL

## 2012-04-10 MED ORDER — EPHEDRINE SULFATE 50 MG/ML IJ SOLN
INTRAMUSCULAR | Status: DC | PRN
  Administered 2012-04-10 (×2): 5 mg via INTRAVENOUS
  Administered 2012-04-10: 10 mg via INTRAVENOUS

## 2012-04-10 MED ORDER — SUCCINYLCHOLINE CHLORIDE 20 MG/ML IJ SOLN
INTRAMUSCULAR | Status: DC | PRN
  Administered 2012-04-10: 160 mg via INTRAVENOUS

## 2012-04-10 MED ORDER — 0.9 % SODIUM CHLORIDE (POUR BTL) OPTIME
TOPICAL | Status: DC | PRN
  Administered 2012-04-10: 1000 mL

## 2012-04-10 MED ORDER — ALBUTEROL SULFATE HFA 108 (90 BASE) MCG/ACT IN AERS
2.0000 | INHALATION_SPRAY | RESPIRATORY_TRACT | Status: DC | PRN
  Filled 2012-04-10: qty 6.7

## 2012-04-10 MED ORDER — ACETAMINOPHEN 160 MG/5ML PO SOLN: 650.0000 mg | ORAL | Status: DC | PRN

## 2012-04-10 SURGICAL SUPPLY — 48 items
ADH SKN CLS APL DERMABOND .7 (GAUZE/BANDAGES/DRESSINGS) ×1
CANISTER SUCTION 2500CC (MISCELLANEOUS) ×2 IMPLANT
CLEANER TIP ELECTROSURG 2X2 (MISCELLANEOUS) ×2 IMPLANT
CLOTH BEACON ORANGE TIMEOUT ST (SAFETY) ×2 IMPLANT
CONT SPEC 4OZ CLIKSEAL STRL BL (MISCELLANEOUS) ×2 IMPLANT
CORDS BIPOLAR (ELECTRODE) ×2 IMPLANT
COVER SURGICAL LIGHT HANDLE (MISCELLANEOUS) ×2 IMPLANT
DECANTER SPIKE VIAL GLASS SM (MISCELLANEOUS) ×2 IMPLANT
DERMABOND ADVANCED (GAUZE/BANDAGES/DRESSINGS) ×1
DERMABOND ADVANCED .7 DNX12 (GAUZE/BANDAGES/DRESSINGS) IMPLANT
DRAIN SNY 10 ROU (WOUND CARE) ×1 IMPLANT
DRSG TEGADERM 4X4.75 (GAUZE/BANDAGES/DRESSINGS) ×2 IMPLANT
ELECT COATED BLADE 2.86 ST (ELECTRODE) ×2 IMPLANT
ELECT REM PT RETURN 9FT ADLT (ELECTROSURGICAL) ×2
ELECTRODE REM PT RTRN 9FT ADLT (ELECTROSURGICAL) ×1 IMPLANT
EVACUATOR SILICONE 100CC (DRAIN) ×2 IMPLANT
GAUZE SPONGE 4X4 16PLY XRAY LF (GAUZE/BANDAGES/DRESSINGS) ×4 IMPLANT
GLOVE BIO SURGEON STRL SZ8 (GLOVE) ×1 IMPLANT
GLOVE BIOGEL M 7.0 STRL (GLOVE) ×3 IMPLANT
GLOVE BIOGEL PI IND STRL 8 (GLOVE) IMPLANT
GLOVE BIOGEL PI INDICATOR 8 (GLOVE) ×1
GLOVE ECLIPSE 6.5 STRL STRAW (GLOVE) ×1 IMPLANT
GLOVE ECLIPSE 7.5 STRL STRAW (GLOVE) ×1 IMPLANT
GOWN PREVENTION PLUS XLARGE (GOWN DISPOSABLE) ×1 IMPLANT
GOWN STRL NON-REIN LRG LVL3 (GOWN DISPOSABLE) ×6 IMPLANT
KIT BASIN OR (CUSTOM PROCEDURE TRAY) ×2 IMPLANT
KIT ROOM TURNOVER OR (KITS) ×2 IMPLANT
MARKER SKIN DUAL TIP RULER LAB (MISCELLANEOUS) ×2 IMPLANT
NS IRRIG 1000ML POUR BTL (IV SOLUTION) ×2 IMPLANT
PAD ARMBOARD 7.5X6 YLW CONV (MISCELLANEOUS) ×4 IMPLANT
PENCIL BUTTON HOLSTER BLD 10FT (ELECTRODE) ×2 IMPLANT
PROBE NERVBE PRASS .33 (MISCELLANEOUS) ×1 IMPLANT
SHEARS HARMONIC 9CM CVD (BLADE) ×1 IMPLANT
SPONGE INTESTINAL PEANUT (DISPOSABLE) ×2 IMPLANT
STAPLER VISISTAT 35W (STAPLE) ×2 IMPLANT
SUT CHROMIC 2 0 SH (SUTURE) IMPLANT
SUT ETHILON 3 0 PS 1 (SUTURE) ×3 IMPLANT
SUT ETHILON 5 0 PS 2 18 (SUTURE) ×2 IMPLANT
SUT SILK 2 0 REEL (SUTURE) ×1 IMPLANT
SUT SILK 2 0 SH CR/8 (SUTURE) ×3 IMPLANT
SUT SILK 3 0 REEL (SUTURE) ×2 IMPLANT
SUT VIC AB 3-0 FS2 27 (SUTURE) ×1 IMPLANT
SUT VICRYL 4-0 PS2 18IN ABS (SUTURE) ×2 IMPLANT
TOWEL OR 17X24 6PK STRL BLUE (TOWEL DISPOSABLE) ×2 IMPLANT
TOWEL OR 17X26 10 PK STRL BLUE (TOWEL DISPOSABLE) ×2 IMPLANT
TRAY ENT MC OR (CUSTOM PROCEDURE TRAY) ×2 IMPLANT
TUBE ENDOTRAC EMG 7X10.2 (MISCELLANEOUS) ×1 IMPLANT
WATER STERILE IRR 1000ML POUR (IV SOLUTION) ×2 IMPLANT

## 2012-04-10 NOTE — Anesthesia Postprocedure Evaluation (Signed)
  Anesthesia Post-op Note  Patient: [name]  Procedure(s) Performed: Procedure(s) (LRB) with comments: THYROIDECTOMY (N/A) - Total Thyroidectomy  Patient Location: PACU  Anesthesia Type:General  Level of Consciousness: awake, alert  and oriented  Airway and Oxygen Therapy: Patient Spontanous Breathing and Patient connected to nasal cannula oxygen  Post-op Pain: none  Post-op Assessment: Post-op Vital signs reviewed and Patient's Cardiovascular Status Stable  Post-op Vital Signs: stable  Complications: No apparent anesthesia complications

## 2012-04-10 NOTE — Anesthesia Procedure Notes (Signed)
Procedure Name: Intubation Date/Time: 04/10/2012 8:54 AM Performed by: Elon Alas Pre-anesthesia Checklist: Patient identified, Timeout performed, Emergency Drugs available, Suction available and Patient being monitored Patient Re-evaluated:Patient Re-evaluated prior to inductionOxygen Delivery Method: Circle system utilized Preoxygenation: Pre-oxygenation with 100% oxygen Intubation Type: IV induction and Rapid sequence Tube size: 7.0 mm Number of attempts: 1 Airway Equipment and Method: Stylet and Video-laryngoscopy Placement Confirmation: positive ETCO2,  ETT inserted through vocal cords under direct vision and breath sounds checked- equal and bilateral Secured at: 22 cm Tube secured with: Tape Dental Injury: Teeth and Oropharynx as per pre-operative assessment

## 2012-04-10 NOTE — H&P (Signed)
[name] is an 47 y.o. female.   Chief Complaint: Thyromegaly HPI: chronic thyroid enlargement with tracheal compression  Past Medical History  Diagnosis Date  . Anemia     iron deficiency  . Asthma     a. 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  . Depression     a. Stress reaction 08/2011 in multiple social stressors.  . Diabetes mellitus   . Hypertension   . Morbid obesity   . Hyperlipidemia   . Vertigo   . PCOS (polycystic ovarian syndrome)   . Gastritis   . Insomnia   . GERD (gastroesophageal reflux disease)   . OSA (obstructive sleep apnea) 01/17/2012  . CHF (congestive heart failure)   . Dysrhythmia   . Hyperthyroidism   . Anxiety   . Chronic kidney disease     /13  . Hemorrhoid   . Arthritis   . Complication of anesthesia     DIFFICULT WAKING- 2011    Past Surgical History  Procedure Date  . Cholecystectomy   . Hemorrhoid surgery   . Tubal ligation   . Shoulder surgery 06/2009  . Tonsillectomy and adenoidectomy CHILD  . Intrauterine device insertion 2011    Family History  Problem Relation Age of Onset  . Cancer Father     colon cancer  . Arthritis Father   . Arthritis Mother   . Asthma Mother   . Hypertension Mother    Social History:  reports that she quit smoking about 8 years ago. She has never used smokeless tobacco. She reports that she does not drink alcohol or use illicit drugs.  Allergies:  Allergies  Allergen Reactions  . Tape Rash    Plastic tape  . Buspirone Hcl Nausea Only  . Glipizide     REACTION: sleepy, low sugar  . Ivp Dye (Iodinated Diagnostic Agents)     Shut kidneys down  . Oxycodone-Acetaminophen Itching    Medications Prior to Admission  Medication Sig Dispense Refill  . albuterol (PROAIR HFA) 108 (90 BASE) MCG/ACT inhaler Inhale 2 puffs into the lungs every 4 (four) hours as needed. For wheezing  1 Inhaler  4  . buPROPion (WELLBUTRIN XL) 150 MG 24 hr tablet Take 150 mg by  mouth daily.      . carvedilol (COREG) 3.125 MG tablet Take 1 tablet (3.125 mg total) by mouth 2 (two) times daily with a meal.  60 tablet  3  . ferrous sulfate 325 (65 FE) MG tablet Take 325 mg by mouth 2 (two) times daily.        . fluticasone (FLONASE) 50 MCG/ACT nasal spray Place 2 sprays into the nose daily as needed. For allergies      . insulin glargine (LANTUS) 100 UNIT/ML injection Inject 30 Units into the skin at bedtime.      Marland Kitchen loratadine (CLARITIN) 10 MG tablet Take 10 mg by mouth daily.      . pantoprazole (PROTONIX) 40 MG tablet Take 40 mg by mouth daily.      . sertraline (ZOLOFT) 50 MG tablet TAKE 2 TABLETS BY MOUTH DAILY.  30 tablet  11  . simvastatin (ZOCOR) 20 MG tablet Take 20 mg by mouth every evening.      Marland Kitchen spironolactone (ALDACTONE) 25 MG tablet Take 1 tablet (25 mg total) by mouth daily.  30 tablet  3  . zolpidem (AMBIEN) 10 MG tablet Take 0.5-1 tablets (5-10 mg total) by mouth at bedtime  as needed. For sleep  30 tablet  5  . ONE TOUCH ULTRA TEST test strip USE AS DIRECTED TO CHECK BLOOD SUGAR TWICE DAILY  100 each  1  . torsemide (DEMADEX) 20 MG tablet Daily for weight 275 or greater.  30 tablet  3    Results for orders placed during the hospital encounter of 04/10/12 (from the past 48 hour(s))  GLUCOSE, CAPILLARY     Status: Abnormal   Collection Time   04/10/12  6:45 AM      Component Value Range Comment   Glucose-Capillary 121 (*) 70 - 99 mg/dL   HCG, SERUM, QUALITATIVE     Status: Normal   Collection Time   04/10/12  6:46 AM      Component Value Range Comment   Preg, Serum NEGATIVE  NEGATIVE    No results found.  Review of Systems  Constitutional: Negative.   Respiratory: Negative.   Cardiovascular: Negative.   Gastrointestinal: Negative.   Musculoskeletal: Negative.   Skin: Negative.   Neurological: Negative.     Blood pressure 159/89, pulse 57, temperature 98.2 F (36.8 C), temperature source Oral, resp. rate 18, SpO2 98.00%. Physical Exam    Constitutional: She is oriented to person, place, and time. She appears well-developed and well-nourished.  Neck: Normal range of motion. Neck supple. Thyromegaly present.  Cardiovascular: Normal rate and regular rhythm.   Respiratory: Effort normal and breath sounds normal.  GI: Soft.  Musculoskeletal: Normal range of motion.  Neurological: She is alert and oriented to person, place, and time.     Assessment/Plan Adm for total thyroidectomy  Genelle Economou 04/10/2012, 7:40 AM

## 2012-04-10 NOTE — Progress Notes (Signed)
04/10/2012 6:05 PM  Encarnacion Chu [id number]  Post-Op check    Temp:  [97.5 F (36.4 C)-98.6 F (37 C)] 97.5 F (36.4 C) (01/17 1450) Pulse Rate:  [57-80] 66  (01/17 1450) Resp:  [14-20] 18  (01/17 1450) BP: (106-171)/(52-97) 171/93 mmHg (01/17 1450) SpO2:  [98 %-100 %] 99 % (01/17 1450) Weight:  [120.1 kg (264 lb 12.4 oz)] 120.1 kg (264 lb 12.4 oz) (01/17 1500),     Intake/Output Summary (Last 24 hours) at 04/10/12 1805 Last data filed at 04/10/12 1345  Gross per 24 hour  Intake   1200 ml  Output     85 ml  Net   1115 ml   JP drain 40 ml  Results for orders placed during the hospital encounter of 04/10/12 (from the past 24 hour(s))  GLUCOSE, CAPILLARY     Status: Abnormal   Collection Time   04/10/12  6:45 AM      Component Value Range   Glucose-Capillary 121 (*) 70 - 99 mg/dL  HCG, SERUM, QUALITATIVE     Status: Normal   Collection Time   04/10/12  6:46 AM      Component Value Range   Preg, Serum NEGATIVE  NEGATIVE  GLUCOSE, CAPILLARY     Status: Abnormal   Collection Time   04/10/12 11:42 AM      Component Value Range   Glucose-Capillary 144 (*) 70 - 99 mg/dL   Comment 1 Notify RN      SUBJECTIVE:  C/o pain AD, pain with swallowing.  No SOB.  No chest pain.  No N,V.   OBJECTIVE:  Alert, voice clear.  Breathing well.  Neck flat.  Drain functioning.  IMPRESSION:  Satisfactory check  PLAN:  CPAP for sleep.  Follow Ca++. Anticipate discharge Sunday.  [name]

## 2012-04-10 NOTE — Progress Notes (Signed)
Giving lunch relief  

## 2012-04-10 NOTE — Transfer of Care (Signed)
Immediate Anesthesia Transfer of Care Note  Patient: [name]  Procedure(s) Performed: Procedure(s) (LRB) with comments: THYROIDECTOMY (N/A) - Total Thyroidectomy  Patient Location: PACU  Anesthesia Type:General  Level of Consciousness: awake, alert , oriented and sedated  Airway & Oxygen Therapy: Patient Spontanous Breathing and Patient connected to nasal cannula oxygen  Post-op Assessment: Report given to PACU RN, Post -op Vital signs reviewed and stable and Patient moving all extremities  Post vital signs: Reviewed and stable  Complications: No apparent anesthesia complications

## 2012-04-10 NOTE — Brief Op Note (Signed)
04/10/2012  11:35 AM  PATIENT:  Franchot Heidelberg  47 y.o. female  PRE-OPERATIVE DIAGNOSIS:  Multinodular goiter  POST-OPERATIVE DIAGNOSIS:  Multinodular goiter  PROCEDURE:  Procedure(s) (LRB) with comments: THYROIDECTOMY (N/A) - Total Thyroidectomy  SURGEON:  Surgeon(s) and Role:    * Osborn Coho, MD - Primary    * Suzanna Obey, MD - Assisting  PHYSICIAN ASSISTANT:   ASSISTANTS: Byers   ANESTHESIA:   general  EBL:  Total I/O In: 1000 [I.V.:1000] Out: 45 [Blood:45]  BLOOD ADMINISTERED:none  DRAINS: (10 Fr) Jackson-Pratt drain(s) with closed bulb suction in the ant neck inc   LOCAL MEDICATIONS USED:  LIDOCAINE  and Amount: 4 ml  SPECIMEN:  Source of Specimen:  Thyroid  DISPOSITION OF SPECIMEN:  PATHOLOGY  COUNTS:  YES  TOURNIQUET:  * No tourniquets in log *  DICTATION: .Other Dictation: Dictation Number [id number]  PLAN OF CARE: Admit to inpatient   PATIENT DISPOSITION:  PACU - hemodynamically stable.   Delay start of Pharmacological VTE agent (>24hrs) due to surgical blood loss or risk of bleeding: no

## 2012-04-10 NOTE — Anesthesia Preprocedure Evaluation (Addendum)
Anesthesia Evaluation  Patient identified by MRN, date of birth, ID band Patient awake    Reviewed: Allergy & Precautions, H&P , NPO status , Patient's Chart, lab work & pertinent test results, reviewed documented beta blocker date and time   History of Anesthesia Complications (+) PROLONGED EMERGENCE  Airway Mallampati: II TM Distance: >3 FB Neck ROM: Full Positive for:  Tracheal deviation   Dental  (+) Dental Advisory Given and Teeth Intact   Pulmonary asthma , sleep apnea ,  breath sounds clear to auscultation        Cardiovascular hypertension, Pt. on home beta blockers + Peripheral Vascular Disease and +CHF Rhythm:Regular Rate:Normal     Neuro/Psych PSYCHIATRIC DISORDERS Anxiety Depression    GI/Hepatic GERD-  Medicated,  Endo/Other  diabetesHyperthyroidism Morbid obesityCT neck - Mild compression and rightward deviation of the trachea at the level of the enlarged left thyroid lobe  Renal/GU      Musculoskeletal   Abdominal   Peds  Hematology   Anesthesia Other Findings   Reproductive/Obstetrics                        Anesthesia Physical Anesthesia Plan  ASA: III  Anesthesia Plan: General   Post-op Pain Management:    Induction: Intravenous  Airway Management Planned: Oral ETT and Video Laryngoscope Planned  Additional Equipment:   Intra-op Plan:   Post-operative Plan: Extubation in OR  Informed Consent: I have reviewed the patients History and Physical, chart, labs and discussed the procedure including the risks, benefits and alternatives for the proposed anesthesia with the patient or authorized representative who has indicated his/her understanding and acceptance.   Dental advisory given  Plan Discussed with: Anesthesiologist and Surgeon  Anesthesia Plan Comments: (Morbid Obesity OSA on CPAP H/o ARF 11/13 Cr now 0.67 Type 2 DM glucose 121 Htn H/O dyspnea nuclear Stress  test 02/10/12 (-) for ischemia EF 65% GERD  Plan GA with oral ETT  Kipp Brood, MD)    Anesthesia Quick Evaluation

## 2012-04-10 NOTE — Preoperative (Signed)
Beta Blockers   Reason not to administer Beta Blockers:Not Applicable, pt took 1/17

## 2012-04-11 LAB — GLUCOSE, CAPILLARY
Glucose-Capillary: 126 mg/dL — ABNORMAL HIGH (ref 70–99)
Glucose-Capillary: 192 mg/dL — ABNORMAL HIGH (ref 70–99)

## 2012-04-11 LAB — CALCIUM: Calcium: 10 mg/dL (ref 8.4–10.5)

## 2012-04-11 MED ORDER — HYDROMORPHONE HCL 2 MG PO TABS: 2.0000 mg | ORAL_TABLET | ORAL | Status: DC | PRN

## 2012-04-11 NOTE — Op Note (Signed)
[name], [name]             ACCOUNT NO.:  [id number]  MEDICAL RECORD NO.:  [id number]  LOCATION:  6N14C                        FACILITY:  [facility]  PHYSICIAN:  Kinnie Scales. Annalee Genta, M.D.DATE OF BIRTH:  18-Jan-1966  DATE OF PROCEDURE:  04/10/2012 DATE OF DISCHARGE:                              OPERATIVE REPORT   PREOPERATIVE DIAGNOSIS:  Thyromegaly with tracheal compression.  POSTOPERATIVE DIAGNOSIS:  Thyromegaly with tracheal compression.  INDICATION FOR SURGERY:  Thyromegaly with tracheal compression.  SURGICAL PROCEDURE:  Total thyroidectomy.  SURGEON:  Kinnie Scales. Annalee Genta, MD  ASSISTANT:  Suzanna Obey, M.D.  ANESTHESIA:  General endotracheal with intraoperative nerve monitoring (NIMS).  COMPLICATIONS:  No complications.  BLOOD LOSS:  Less than 50 mL.  The patient transferred from the operating room to the recovery room in stable condition.  BRIEF HISTORY:  The patient is a 47 year old, white female, referred to our office for evaluation of a significantly enlarged thyroid gland with tracheal compression.  The patient has a long-standing history of airway concerns and was recently admitted to the hospital in mild congestive heart failure.  She was found to have obstructive sleep apnea, was started on CPAP.  Workup including chest CT scan showed findings consistent with pulmonary hypertension, which responded to appropriate medical therapy.  The patient was found to have an incidentally enlarged thyroid gland, particularly on the left-hand side with significant tracheal deviation and slight compression.  Given the patient's history, examination, and physical findings, I recommended thyroidectomy to reduce the risk of long-term issues with tracheal airway narrowing and to improve airway patency.  The risks and benefits of the procedure were discussed in detail with the patient who understood and concurred with our plan for surgery which is scheduled under general  anesthesia at [facility] OR.  PROCEDURE:  The patient was brought to the operating room on April 10, 2012, placed in supine position on the operating table.  General endotracheal anesthesia was established without difficulty.  The endotracheal tube included intraoperative nerve monitoring.  Xomed NIMS endotracheal tube was correctly placed and the nerve monitoring was used throughout the thyroid dissection.  The patient was then positioned on the operating table and prepped and draped in a sterile fashion.  Surgical procedure was begun with injection of local anesthetic consisting of 4 mL of 1% lidocaine 1:100,000 solution epinephrine injected in the subcutaneous tissue along the proposed incision site. After allowing adequate time for vasoconstriction and hemostasis, the incision was created with a #15 scalpel.  His low anterior neck incision, following a preexisting skin crease carried through the skin underlying deep subcutaneous tissue.  Dissection of subcutaneous fat was then undertaken.  Small amount of fatty tissue was removed in order to allow adequate access to the anterior compartment of the neck and to improve cosmesis and closure.  The self-retaining retractor was then placed in the subplatysmal plane.  Strap muscles were identified in the midline, divided and lateralized to allow access to the anterior compartment of the neck.  The thyroid gland was significantly enlarged bilaterally predicted on the left-hand side, with findings consistent with goitrous changes.  There was no evidence of extension or lymphadenopathy.  Dissection was begun on the left-hand  side, elevating the strap muscles and sternocleidomastoid muscle laterally and dissecting along the mid lateral component of the gland.  Dissection was then carried superiorly and inferiorly.  The middle thyroid vein was identified, divided and suture ligated.  Dissection was then carried superiorly and  the superior thyroid neurovascular bundle was identified. The artery and vein were divided and suture ligated with 2-0 silk suture.  The superior thyroid nerve was carefully dissected and preserved.  The gland was then dissected inferiorly along the inferior thyroid vessel bundle.  A small mass consistent with parathyroid gland was preserved in this area, and the gland was then reflected from lateral to medial.  Dissection along the inferior component of the gland.  The left recurrent laryngeal nerve was identified.  This was traced and dissected carefully and preserved throughout its course.  The gland was then dissected free of the trachea and divided the isthmus and the left thyroid lobe was sent to pathology.  The frozen section analysis was undertaken.  The wound bed was thoroughly irrigated and suctioned.  The nerve was intact and stimulated with the nerve monitor. The patient's right side was then dissected again elevating the strap muscles and sternocleidomastoid muscle laterally.  The mid component of the right thyroid lobe was carefully dissected.  Middle thyroid vein was divided and suture ligated.  Inferior dissection was carried out, and the inferior thyroid vascular pedicle was ligated using both combination of suture ligature and Harmonic Scalpel.  The gland was then reflected anteriorly and the superior pole was carefully dissected, elevating the strap muscles, and dissecting along the superior component.  The superior thyroid artery and vein were divided and suture ligated.  The superior thyroid nerve was reflected and preserved.  The gland was then rotated on its tracheal attachment and the right recurrent laryngeal nerve was identified.  This was carefully dissected elevating the gland and preserving the nerve to its entire course.  The gland was then dissected free of its tracheal attachment and sent to pathology.  The wound bed was carefully irrigated and bipolar  cautery was used to maintain hemostasis.  The wound was then closed in multiple layers after placement of a 10-French round drain which was sutured to the skin and carried through the left lateral aspect of the incision.  The surgical incision was then closed in multiple layers beginning with 3-0 Vicryl suture to reapproximate the strap muscles in the midline.  Deep subcutaneous tissue closed with 3-0 and 4-0 Vicryl suture.  The immediate subcutaneous tissue closed with horizontal 4-0 Vicryl sutures in a mattress fashion, and the final skin edge was closed with Dermabond surgical glue.  The patient was awakened from anesthetic.  She was then extubated and transferred from the operating room to the recovery room in stable condition.  There were no complications and blood loss was less than 50 mL.          ______________________________ Kinnie Scales. Annalee Genta, M.D.     DLS/MEDQ  D:  96/06/5407  T:  04/11/2012  Job:  [id number]  cc:   Leslye Peer, MD Bevelyn Buckles. Bensimhon, MD

## 2012-04-11 NOTE — Progress Notes (Signed)
Pt. Has CPAP set up in room at bedside. Pt. Stated that she would place herself on CPAP when ready for bed. Pt. Was made aware to call if she needed assistance.

## 2012-04-11 NOTE — Progress Notes (Signed)
04/11/2012 12:37 PM  Encarnacion Chu [id number]  Post-Op Day 1    Temp:  [97.5 F (36.4 C)-98.6 F (37 C)] 98.2 F (36.8 C) (01/18 1045) Pulse Rate:  [59-78] 60  (01/18 1045) Resp:  [15-20] 18  (01/18 1045) BP: (124-173)/(68-97) 173/92 mmHg (01/18 1045) SpO2:  [95 %-100 %] 100 % (01/18 1045) Weight:  [120.1 kg (264 lb 12.4 oz)] 120.1 kg (264 lb 12.4 oz) (01/17 1500),     Intake/Output Summary (Last 24 hours) at 04/11/12 1237 Last data filed at 04/11/12 1111  Gross per 24 hour  Intake 1506.25 ml  Output   4585 ml  Net -3078.75 ml   JP 40 ml  Results for orders placed during the hospital encounter of 04/10/12 (from the past 24 hour(s))  GLUCOSE, CAPILLARY     Status: Abnormal   Collection Time   04/10/12  5:00 PM      Component Value Range   Glucose-Capillary 155 (*) 70 - 99 mg/dL   Comment 1 Notify RN    GLUCOSE, CAPILLARY     Status: Abnormal   Collection Time   04/10/12 10:28 PM      Component Value Range   Glucose-Capillary 144 (*) 70 - 99 mg/dL  GLUCOSE, CAPILLARY     Status: Abnormal   Collection Time   04/11/12  8:36 AM      Component Value Range   Glucose-Capillary 126 (*) 70 - 99 mg/dL   Comment 1 Notify RN    CALCIUM     Status: Normal   Collection Time   04/11/12  9:10 AM      Component Value Range   Calcium 10.0  8.4 - 10.5 mg/dL  GLUCOSE, CAPILLARY     Status: Abnormal   Collection Time   04/11/12 12:09 PM      Component Value Range   Glucose-Capillary 138 (*) 70 - 99 mg/dL   Comment 1 Notify RN      SUBJECTIVE:  Still mod-lg pain, neck and ears, not terribly well controlled.  No SOB.  CPAP difficult due to frequent trips to bathroom.  OBJECTIVE:  Looks sl distressed.  Breathing OK.  Voice OK.  Wound flat.  Drain functional.  Ears cl bilat.  IMPRESSION:  Satisfactory check.  Ca++ good this AM.  Glu good.    PLAN:  Increase activity.  D/C IV.  Follow CBG, Ca++.  O2 sat monitor off when out of bed.  Anticipate drain removal and discharge home  tomorrow.  [name]

## 2012-04-12 LAB — GLUCOSE, CAPILLARY
Glucose-Capillary: 162 mg/dL — ABNORMAL HIGH (ref 70–99)
Glucose-Capillary: 147 mg/dL — ABNORMAL HIGH (ref 70–99)

## 2012-04-12 MED ORDER — HYDROCODONE-ACETAMINOPHEN 5-325 MG PO TABS: 1.0000 | ORAL_TABLET | ORAL | Status: DC | PRN

## 2012-04-12 MED ORDER — LEVOTHYROXINE SODIUM 100 MCG PO TABS: 100.0000 ug | ORAL_TABLET | Freq: Every day | ORAL | Status: DC

## 2012-04-12 NOTE — Progress Notes (Signed)
Pt and significant other given discharge instructions.  They verbalized understanding of all instructions and when next doses are due for meds.  Scripts given x 2.  Pt taken down for discharge via wheelchair.

## 2012-04-12 NOTE — Progress Notes (Signed)
04/12/2012 1:50 PM  [name] [id number]  Post-Op Day 2, Discharge summary    Temp:  [98.2 F (36.8 C)-98.7 F (37.1 C)] 98.6 F (37 C) (01/19 0954) Pulse Rate:  [54-60] 54  (01/19 0954) Resp:  [17-20] 17  (01/19 0954) BP: (116-158)/(54-85) 116/54 mmHg (01/19 0954) SpO2:  [95 %-98 %] 98 % (01/19 0954),     Intake/Output Summary (Last 24 hours) at 04/12/12 1350 Last data filed at 04/12/12 1200  Gross per 24 hour  Intake   1440 ml  Output   3115 ml  Net  -1675 ml   JP 20 ml  Results for orders placed during the hospital encounter of 04/10/12 (from the past 24 hour(s))  CALCIUM     Status: Normal   Collection Time   04/11/12  5:21 PM      Component Value Range   Calcium 10.0  8.4 - 10.5 mg/dL  GLUCOSE, CAPILLARY     Status: Normal   Collection Time   04/11/12  5:44 PM      Component Value Range   Glucose-Capillary 94  70 - 99 mg/dL   Comment 1 Notify RN    GLUCOSE, CAPILLARY     Status: Abnormal   Collection Time   04/11/12  8:59 PM      Component Value Range   Glucose-Capillary 192 (*) 70 - 99 mg/dL  GLUCOSE, CAPILLARY     Status: Abnormal   Collection Time   04/12/12  8:01 AM      Component Value Range   Glucose-Capillary 162 (*) 70 - 99 mg/dL   Comment 1 Notify RN    CALCIUM     Status: Normal   Collection Time   04/12/12  8:55 AM      Component Value Range   Calcium 10.2  8.4 - 10.5 mg/dL  GLUCOSE, CAPILLARY     Status: Abnormal   Collection Time   04/12/12 12:06 PM      Component Value Range   Glucose-Capillary 147 (*) 70 - 99 mg/dL   Comment 1 Notify RN      SUBJECTIVE:  Ear and neck pain better.  No breathing difficulty.  Swallowing slightly tender.  OBJECTIVE:  Wound flat and intact.  Drain removed. Voice clear.    IMPRESSION:  Satisfactory check.  PLAN:  D/c home.   Admit:  17 JAN D/C: 19 JAN Final Dx:  Thyroid Goiter, Morbid Obesity, Obstructive Sleep Apnea, Diabetes Proc:  Total Thyroidectomy 17 JAN Comp:  None Re check:  2 wks Dr.  Annalee Genta Rx:  Vicodin, Synthroid Instructions written and given.  [name]

## 2012-04-13 ENCOUNTER — Encounter (HOSPITAL_COMMUNITY): Payer: Self-pay | Admitting: Otolaryngology

## 2012-04-14 ENCOUNTER — Encounter: Payer: Self-pay | Admitting: Family Medicine

## 2012-04-19 ENCOUNTER — Telehealth: Payer: Self-pay | Admitting: Family Medicine

## 2012-04-19 DIAGNOSIS — E119 Type 2 diabetes mellitus without complications: Secondary | ICD-10-CM

## 2012-04-19 DIAGNOSIS — I10 Essential (primary) hypertension: Secondary | ICD-10-CM

## 2012-04-19 NOTE — Telephone Encounter (Signed)
Message copied by Judy Pimple on Sun Apr 19, 2012  5:54 PM ------      Message from: Alvina Chou      Created: Wed Apr 15, 2012 12:30 PM      Regarding: Lab orders Monday, 1.27.14       Labs for a 2 month f/u

## 2012-04-20 ENCOUNTER — Other Ambulatory Visit (INDEPENDENT_AMBULATORY_CARE_PROVIDER_SITE_OTHER): Payer: BC Managed Care – PPO

## 2012-04-20 DIAGNOSIS — E119 Type 2 diabetes mellitus without complications: Secondary | ICD-10-CM

## 2012-04-20 DIAGNOSIS — I10 Essential (primary) hypertension: Secondary | ICD-10-CM

## 2012-04-20 LAB — COMPREHENSIVE METABOLIC PANEL
ALT: 16 U/L (ref 0–35)
AST: 13 U/L (ref 0–37)
Alkaline Phosphatase: 70 U/L (ref 39–117)
BUN: 11 mg/dL (ref 6–23)
Calcium: 9.1 mg/dL (ref 8.4–10.5)
Creatinine, Ser: 0.7 mg/dL (ref 0.4–1.2)
Total Bilirubin: 0.3 mg/dL (ref 0.3–1.2)

## 2012-04-20 LAB — HEMOGLOBIN A1C: Hgb A1c MFr Bld: 7.4 % — ABNORMAL HIGH (ref 4.6–6.5)

## 2012-04-27 ENCOUNTER — Encounter: Payer: Self-pay | Admitting: Family Medicine

## 2012-04-27 ENCOUNTER — Ambulatory Visit (INDEPENDENT_AMBULATORY_CARE_PROVIDER_SITE_OTHER): Payer: BC Managed Care – PPO | Admitting: Family Medicine

## 2012-04-27 VITALS — BP 116/76 | HR 69 | Temp 97.7°F | Ht 65.0 in | Wt 258.8 lb

## 2012-04-27 DIAGNOSIS — E119 Type 2 diabetes mellitus without complications: Secondary | ICD-10-CM

## 2012-04-27 DIAGNOSIS — Z85850 Personal history of malignant neoplasm of thyroid: Secondary | ICD-10-CM | POA: Insufficient documentation

## 2012-04-27 DIAGNOSIS — E079 Disorder of thyroid, unspecified: Secondary | ICD-10-CM

## 2012-04-27 DIAGNOSIS — I10 Essential (primary) hypertension: Secondary | ICD-10-CM

## 2012-04-27 DIAGNOSIS — E249 Cushing's syndrome, unspecified: Secondary | ICD-10-CM | POA: Insufficient documentation

## 2012-04-27 DIAGNOSIS — C73 Malignant neoplasm of thyroid gland: Secondary | ICD-10-CM

## 2012-04-27 MED ORDER — CARVEDILOL 3.125 MG PO TABS: 3.1250 mg | ORAL_TABLET | Freq: Two times a day (BID) | ORAL | Status: DC

## 2012-04-27 MED ORDER — SPIRONOLACTONE 25 MG PO TABS: 25.0000 mg | ORAL_TABLET | Freq: Every day | ORAL | Status: DC

## 2012-04-27 MED ORDER — SIMVASTATIN 20 MG PO TABS: 20.0000 mg | ORAL_TABLET | Freq: Every evening | ORAL | Status: DC

## 2012-04-27 MED ORDER — PANTOPRAZOLE SODIUM 40 MG PO TBEC: 40.0000 mg | DELAYED_RELEASE_TABLET | Freq: Every day | ORAL | Status: DC

## 2012-04-27 MED ORDER — SERTRALINE HCL 50 MG PO TABS: 100.0000 mg | ORAL_TABLET | Freq: Every day | ORAL | Status: DC

## 2012-04-27 MED ORDER — INSULIN GLARGINE 100 UNIT/ML ~~LOC~~ SOLN: 30.0000 [IU] | Freq: Every day | SUBCUTANEOUS | Status: DC

## 2012-04-27 MED ORDER — BUPROPION HCL ER (XL) 150 MG PO TB24: 150.0000 mg | ORAL_TABLET | Freq: Every day | ORAL | Status: DC

## 2012-04-27 NOTE — Assessment & Plan Note (Signed)
Much improved with healthy diet / exercise and wt loss Lab Results  Component Value Date   HGBA1C 7.4* 04/20/2012    Commended- disc goal of a1c under 7 Enc to keep working on lifestyle  F/u 3 mo after re check

## 2012-04-27 NOTE — Assessment & Plan Note (Signed)
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

## 2012-04-27 NOTE — Assessment & Plan Note (Signed)
Discussed how this problem influences overall health and the risks it imposes  Reviewed plan for weight loss with lower calorie diet (via better food choices and also portion control or program like weight watchers) and exercise building up to or more than 30 minutes 5 days per week including some aerobic activity   Commended on effort so far   

## 2012-04-27 NOTE — Assessment & Plan Note (Signed)
Pt doing well after thyroidectomy-no problems  F/u with endocrine upcoming

## 2012-04-27 NOTE — Patient Instructions (Addendum)
Take care of yourself- keep working on healthy diet and exercise  Follow up with thyroid doctor as planned  Follow up with me in 3 months with labs prior I will send px to your pharmacy

## 2012-04-27 NOTE — Assessment & Plan Note (Signed)
bp in fair control at this time  No changes needed  Disc lifstyle change with low sodium diet and exercise   Labs reviewed Wt loss enc

## 2012-04-27 NOTE — Progress Notes (Signed)
Subjective:    Patient ID: [name], female    DOB: 09-23-1965, 47 y.o.   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 Gets to go back to work today  Will see Dr Everardo All tomorrow  She feels better without a thyroid / no pressure - easier to swallow    On replacement-no problems with that at all   bp is stable today  No cp or palpitations or headaches or edema  No side effects to medicines  BP Readings from Last 3 Encounters:  04/27/12 116/76  04/12/12 116/54  04/12/12 116/54      Wt is down 6 lb with bmi of 43 Is still working on it    Diabetes Home sugar results average 120-160-- usually 140s - much better and no hypoglycemia  DM diet - is extremely good  Exercise - is walking outdoors when the weather is nice  Symptoms A1C last  Lab Results  Component Value Date   HGBA1C 7.4* 04/20/2012  this is down from 9  No problems with medications - lantus  Renal protection Last eye exam   Doing well with tx of sleep apnea- feels much better  No chf symptoms at all     Patient Active Problem List  Diagnosis  . THYROID DISORDER  . DIABETES MELLITUS, TYPE II  . POLYCYSTIC OVARIES  . HYPERLIPIDEMIA  . ANEMIA-IRON DEFICIENCY  . DEPRESSION  . OTHER ORGANIC SLEEP DISORDERS  . HYPERTENSION  . HEMORRHOIDS, INTERNAL, WITH BLEEDING  . EXTERNAL HEMORRHOIDS  . ASTHMA  . GERD  . OBESITY, MORBID  . Chronic allergic rhinitis  . Insomnia  . Stress reaction  . Hypertensive urgency  . CHF (congestive heart failure)  . Chest pain  . Anxiety  . Acute diastolic heart failure  . OSA (obstructive sleep apnea) just got cpap at home this past week 01/17/12  . Acute bronchitis  . Thyroid nodule  . Acute renal failure  . Nausea & vomiting  . Diastolic CHF  . UTI (lower urinary tract infection)  . Chronic diastolic heart failure  . Preoperative cardiovascular examination    Past Medical History  Diagnosis Date  . Anemia     iron deficiency  . Asthma     a. 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  . Depression     a. Stress reaction 08/2011 in multiple social stressors.  . Diabetes mellitus   . Hypertension   . Morbid obesity   . Hyperlipidemia   . Vertigo   . PCOS (polycystic ovarian syndrome)   . Gastritis   . Insomnia   . GERD (gastroesophageal reflux disease)   . OSA (obstructive sleep apnea) 01/17/2012  . CHF (congestive heart failure)   . Dysrhythmia   . Hyperthyroidism   . Anxiety   . Chronic kidney disease     /13  . Hemorrhoid   . Arthritis   . Complication of anesthesia     DIFFICULT WAKING- 2011  . Papillary thyroid carcinoma    Past Surgical History  Procedure Date  . Cholecystectomy   . Hemorrhoid surgery   . Tubal ligation   . Shoulder surgery 06/2009  . Tonsillectomy and adenoidectomy CHILD  . Intrauterine device insertion 2011  . Thyroidectomy 04/10/2012    Procedure: THYROIDECTOMY;  Surgeon: Osborn Coho, MD;  Location: [facility] OR;  Service: ENT;  Laterality: N/A;  Total Thyroidectomy   History  Substance Use Topics  . Smoking status: Former Smoker -- 0.2 packs/day for 5 years    Quit date: 03/25/2004  . Smokeless tobacco: Never Used  . Alcohol Use: No   Family History  Problem Relation Age of Onset  . Cancer Father     colon cancer  . Arthritis Father   . Arthritis Mother   . Asthma Mother   . Hypertension Mother    Allergies  Allergen Reactions  . Tape Rash    Plastic tape  . Buspirone Hcl Nausea Only  . Glipizide     REACTION: sleepy, low sugar  . Ivp Dye (Iodinated Diagnostic Agents)     Shut kidneys down  . Oxycodone-Acetaminophen Itching   Current Outpatient Prescriptions on File Prior to Visit  Medication Sig Dispense Refill  . albuterol (PROAIR HFA) 108 (90 BASE) MCG/ACT inhaler Inhale 2 puffs into the lungs every 4 (four) hours as needed. For wheezing  1  Inhaler  4  . buPROPion (WELLBUTRIN XL) 150 MG 24 hr tablet Take 150 mg by mouth daily.      . calcium-vitamin D (OSCAL) 250-125 MG-UNIT per tablet Take 1 tablet by mouth 3 (three) times daily.  60 tablet  1  . carvedilol (COREG) 3.125 MG tablet Take 1 tablet (3.125 mg total) by mouth 2 (two) times daily with a meal.  60 tablet  3  . ferrous sulfate 325 (65 FE) MG tablet Take 325 mg by mouth 2 (two) times daily.        . fluticasone (FLONASE) 50 MCG/ACT nasal spray Place 2 sprays into the nose daily as needed. For allergies      . insulin glargine (LANTUS) 100 UNIT/ML injection Inject 30 Units into the skin at bedtime.      Marland Kitchen levothyroxine (SYNTHROID, LEVOTHROID) 100 MCG tablet Take 1 tablet (100 mcg total) by mouth daily before breakfast.  30 tablet  2  . loratadine (CLARITIN) 10 MG tablet Take 10 mg by mouth daily.      . ONE TOUCH ULTRA TEST test strip USE AS DIRECTED TO CHECK BLOOD SUGAR TWICE DAILY  100 each  1  . pantoprazole (PROTONIX) 40 MG tablet Take 40 mg by mouth daily.      . sertraline (ZOLOFT) 50 MG tablet TAKE 2 TABLETS BY MOUTH DAILY.  30 tablet  11  . simvastatin (ZOCOR) 20 MG tablet Take 20 mg by mouth every evening.      Marland Kitchen spironolactone (ALDACTONE) 25 MG tablet Take 1 tablet (25 mg total) by mouth daily.  30 tablet  3  . torsemide (DEMADEX) 20 MG tablet Daily for weight 275 or greater.  30 tablet  3  . zolpidem (AMBIEN) 10 MG tablet Take 0.5-1 tablets (5-10 mg total) by mouth at bedtime as needed. For sleep  30 tablet  5  . HYDROcodone-acetaminophen (NORCO) 5-325 MG per tablet Take 1-2 tablets by mouth every 6 (six) hours as needed for pain.  30 tablet  0  . HYDROcodone-acetaminophen (NORCO/VICODIN) 5-325 MG per tablet Take 1-2 tablets by mouth every 4 (four) hours as needed.  30 tablet  2    Review of Systems Review of Systems  Constitutional: Negative for fever, appetite change, fatigue and unexpected weight change.  Eyes: Negative for pain and visual disturbance.   Respiratory: Negative for cough and shortness of breath.   Cardiovascular: Negative for cp or palpitations    Gastrointestinal: Negative for nausea, diarrhea and constipation.  Genitourinary:  Negative for urgency and frequency.  Skin: Negative for pallor or rash   Neurological: Negative for weakness, light-headedness, numbness and headaches.  Hematological: Negative for adenopathy. Does not bruise/bleed easily.  Psychiatric/Behavioral: Negative for dysphoric mood. The patient is not nervous/anxious.         Objective:   Physical Exam  Constitutional: She appears well-developed and well-nourished. No distress.       obese and well appearing   HENT:  Head: Normocephalic and atraumatic.  Mouth/Throat: Oropharynx is clear and moist.  Eyes: Conjunctivae normal and EOM are normal. Pupils are equal, round, and reactive to light. Right eye exhibits no discharge. Left eye exhibits no discharge. No scleral icterus.  Neck: Normal range of motion. Neck supple. No JVD present. No tracheal deviation present. No thyromegaly present.       New thyroidectomy scar is healing well   Cardiovascular: Normal rate, regular rhythm and normal heart sounds.  Exam reveals no gallop.   Pulmonary/Chest: Effort normal and breath sounds normal. No respiratory distress. She has no wheezes.  Abdominal: Soft. Bowel sounds are normal. She exhibits no distension and no mass. There is no tenderness.  Musculoskeletal: She exhibits no edema.  Lymphadenopathy:    She has no cervical adenopathy.  Neurological: She is alert. She has normal reflexes. She displays no tremor. No cranial nerve deficit. She exhibits normal muscle tone. Coordination normal.  Skin: Skin is warm and dry. No rash noted. No erythema. No pallor.  Psychiatric: She has a normal mood and affect.          Assessment & Plan:

## 2012-04-28 ENCOUNTER — Encounter: Payer: Self-pay | Admitting: Endocrinology

## 2012-04-28 ENCOUNTER — Ambulatory Visit (INDEPENDENT_AMBULATORY_CARE_PROVIDER_SITE_OTHER): Payer: BC Managed Care – PPO | Admitting: Endocrinology

## 2012-04-28 VITALS — BP 130/70 | HR 63 | Wt 261.0 lb

## 2012-04-28 DIAGNOSIS — E890 Postprocedural hypothyroidism: Secondary | ICD-10-CM | POA: Insufficient documentation

## 2012-04-28 DIAGNOSIS — C73 Malignant neoplasm of thyroid gland: Secondary | ICD-10-CM

## 2012-04-28 NOTE — Progress Notes (Signed)
Subjective:    Patient ID: [name], female    DOB: 1965/06/28, 47 y.o.   MRN: [id number]  HPI Pt was incidentally noted to have a multinodular goiter, on an ultrasound of the neck, approx 4 months ago. She did however, have a slight choking sensation at the neck, and assoc dysphagia.   She had total thyroidectomy 2 weeks ago.  She says these sxs are resolved after the surgery.   Past Medical History  Diagnosis Date  . Anemia     iron deficiency  . Asthma     a. 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  . Depression     a. Stress reaction 08/2011 in multiple social stressors.  . Diabetes mellitus   . Hypertension   . Morbid obesity   . Hyperlipidemia   . Vertigo   . PCOS (polycystic ovarian syndrome)   . Gastritis   . Insomnia   . GERD (gastroesophageal reflux disease)   . OSA (obstructive sleep apnea) 01/17/2012  . CHF (congestive heart failure)   . Dysrhythmia   . Hyperthyroidism   . Anxiety   . Chronic kidney disease     /13  . Hemorrhoid   . Arthritis   . Complication of anesthesia     DIFFICULT WAKING- 2011  . Papillary thyroid carcinoma     Past Surgical History  Procedure Date  . Cholecystectomy   . Hemorrhoid surgery   . Tubal ligation   . Shoulder surgery 06/2009  . Tonsillectomy and adenoidectomy CHILD  . Intrauterine device insertion 2011  . Thyroidectomy 04/10/2012    Procedure: THYROIDECTOMY;  Surgeon: Osborn Coho, MD;  Location: [facility] OR;  Service: ENT;  Laterality: N/A;  Total Thyroidectomy    History   Social History  . Marital Status: Married    Spouse Name: N/A    Number of Children: N/A  . Years of Education: N/A   Occupational History  . Not on file.   Social History Main Topics  . Smoking status: Former Smoker -- 0.2 packs/day for 5 years    Quit date: 03/25/2004  . Smokeless tobacco: Never Used  . Alcohol Use: No  . Drug Use: No  . Sexually Active: Yes   Other Topics Concern  . Not on  file   Social History Narrative  . No narrative on file    Current Outpatient Prescriptions on File Prior to Visit  Medication Sig Dispense Refill  . albuterol (PROAIR HFA) 108 (90 BASE) MCG/ACT inhaler Inhale 2 puffs into the lungs every 4 (four) hours as needed. For wheezing  1 Inhaler  4  . buPROPion (WELLBUTRIN XL) 150 MG 24 hr tablet Take 1 tablet (150 mg total) by mouth daily.  30 tablet  11  . calcium-vitamin D (OSCAL) 250-125 MG-UNIT per tablet Take 1 tablet by mouth 3 (three) times daily.  60 tablet  1  . carvedilol (COREG) 3.125 MG tablet Take 1 tablet (3.125 mg total) by mouth 2 (two) times daily with a meal.  60 tablet  11  . ferrous sulfate 325 (65 FE) MG tablet Take 325 mg by mouth 2 (two) times daily.        . fluticasone (FLONASE) 50 MCG/ACT nasal spray Place 2 sprays into the nose daily as needed. For allergies      . HYDROcodone-acetaminophen (NORCO) 5-325 MG per tablet Take 1-2 tablets by mouth every 6 (six) hours as needed for pain.  30 tablet  0  . HYDROcodone-acetaminophen (NORCO/VICODIN) 5-325 MG per tablet Take 1-2 tablets by mouth every 4 (four) hours as needed.  30 tablet  2  . insulin glargine (LANTUS) 100 UNIT/ML injection Inject 30 Units into the skin at bedtime.  10 mL  11  . levothyroxine (SYNTHROID, LEVOTHROID) 100 MCG tablet Take 1 tablet (100 mcg total) by mouth daily before breakfast.  30 tablet  2  . loratadine (CLARITIN) 10 MG tablet Take 10 mg by mouth daily.      . ONE TOUCH ULTRA TEST test strip USE AS DIRECTED TO CHECK BLOOD SUGAR TWICE DAILY  100 each  1  . pantoprazole (PROTONIX) 40 MG tablet Take 1 tablet (40 mg total) by mouth daily.  30 tablet  11  . sertraline (ZOLOFT) 50 MG tablet Take 2 tablets (100 mg total) by mouth daily.  60 tablet  11  . simvastatin (ZOCOR) 20 MG tablet Take 1 tablet (20 mg total) by mouth every evening.  30 tablet  11  . spironolactone (ALDACTONE) 25 MG tablet Take 1 tablet (25 mg total) by mouth daily.  30 tablet  11  .  torsemide (DEMADEX) 20 MG tablet Daily for weight 275 or greater.  30 tablet  3  . zolpidem (AMBIEN) 10 MG tablet Take 0.5-1 tablets (5-10 mg total) by mouth at bedtime as needed. For sleep  30 tablet  5    Allergies  Allergen Reactions  . Tape Rash    Plastic tape  . Buspirone Hcl Nausea Only  . Glipizide     REACTION: sleepy, low sugar  . Ivp Dye (Iodinated Diagnostic Agents)     Shut kidneys down  . Oxycodone-Acetaminophen Itching    Family History  Problem Relation Age of Onset  . Cancer Father     colon cancer  . Arthritis Father   . Arthritis Mother   . Asthma Mother   . Hypertension Mother   mother has an uncertain type of thyroid problem  There were no vitals taken for this visit.  Review of Systems denies hair loss, cramps, sob, memory loss, constipation, numbness, blurry vision, myalgias, dry skin, easy bruising, and syncope.  She reports depression, rhinorrhea, and weight loss.     Objective:   Physical Exam VS: see vs page GEN: no distress.  Obese. HEAD: head: no deformity eyes: no periorbital swelling, no proptosis external nose and ears are normal mouth: no lesion seen Neck: a healing scar is present.  i do not appreciate a nodule in the thyroid or elsewhere in the neck CHEST WALL: no deformity.  LUNGS:  Clear to auscultation.   CV: reg rate and rhythm, no murmur.   ABD: abdomen is soft, nontender.  no hepatosplenomegaly.  not distended.  no hernia MUSCULOSKELETAL: muscle bulk and strength are grossly normal.  no obvious joint swelling.  gait is normal and steady. EXTEMITIES: no deformity.  no edema PULSES: dorsalis pedis intact bilat.   NEURO:  cn 2-12 grossly intact.   readily moves all 4's.  sensation is intact to touch on the feet SKIN:  Normal texture and temperature.  No rash or suspicious lesion is visible.   NODES:  None palpable at the neck.  PSYCH: alert, oriented x3.  Does not appear anxious nor depressed.   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 - NO LYMPHOVASCULAR INVASION IDENTIFIED. - NO EXTRATHYROIDAL TUMOR EXTENSION IDENTIFIED. - SURGICAL MARGIN, NEGATIVE FOR TUMOR. - SEE TUMOR SYNOPTIC TEMPLATE. 2. Thyroid,  lobectomy, right - BENIGN MULTINODULAR THYROID. - NEGATIVE FOR ATYPIA OR MALIGNANCY.    Assessment & Plan:  A small focus of papillary adenocarcinoma, stage-1.  This is a low-risk situation, and does not warrant adjuvant i-131 rx Postsurgical hypothyroidism, on rx.  She'll need a f/u tsh, and i have ordered. Depression, unlikely thyroid-related

## 2012-04-28 NOTE — Patient Instructions (Addendum)
Please continue the same thyroid medication Please return in 1 year. Please redo the thyroid blood test in a few more weeks.  We'll contact you with results.  The small piece of thyroid cancer does not need any further treatment now.

## 2012-05-22 ENCOUNTER — Ambulatory Visit: Payer: BC Managed Care – PPO

## 2012-05-22 DIAGNOSIS — E079 Disorder of thyroid, unspecified: Secondary | ICD-10-CM

## 2012-05-24 ENCOUNTER — Other Ambulatory Visit: Payer: Self-pay | Admitting: Endocrinology

## 2012-05-24 MED ORDER — LEVOTHYROXINE SODIUM 150 MCG PO TABS: 150.0000 ug | ORAL_TABLET | Freq: Every day | ORAL | Status: DC

## 2012-05-25 ENCOUNTER — Encounter: Payer: Self-pay | Admitting: Endocrinology

## 2012-05-25 ENCOUNTER — Other Ambulatory Visit: Payer: Self-pay | Admitting: Endocrinology

## 2012-05-25 ENCOUNTER — Telehealth: Payer: Self-pay

## 2012-05-25 DIAGNOSIS — E890 Postprocedural hypothyroidism: Secondary | ICD-10-CM

## 2012-05-25 NOTE — Telephone Encounter (Signed)
The blood test said your thyroid level was too low.

## 2012-05-25 NOTE — Telephone Encounter (Signed)
Pt left voicemail message, pt states she got your myhcart message but does not understands why you changed med, are results correct? Please advise [phone].

## 2012-05-25 NOTE — Telephone Encounter (Signed)
Left message pt to call back if any questions

## 2012-05-25 NOTE — Telephone Encounter (Signed)
Please call patient back, she has more questions.

## 2012-06-04 ENCOUNTER — Other Ambulatory Visit: Payer: Self-pay | Admitting: *Deleted

## 2012-06-04 MED ORDER — TORSEMIDE 20 MG PO TABS: ORAL_TABLET | ORAL | Status: DC

## 2012-06-04 NOTE — Telephone Encounter (Signed)
done

## 2012-06-04 NOTE — Telephone Encounter (Signed)
Please give her 6 mo of refils , thanks

## 2012-06-04 NOTE — Telephone Encounter (Signed)
Rx originally filled by a hospitalist refill was denied by that Dr, per pharmacy last fill 01/22/2012. Is it ok to refill this med?

## 2012-06-22 ENCOUNTER — Other Ambulatory Visit: Payer: Self-pay | Admitting: Family Medicine

## 2012-06-26 ENCOUNTER — Other Ambulatory Visit (INDEPENDENT_AMBULATORY_CARE_PROVIDER_SITE_OTHER): Payer: BC Managed Care – PPO

## 2012-06-26 ENCOUNTER — Other Ambulatory Visit: Payer: Self-pay

## 2012-06-26 ENCOUNTER — Other Ambulatory Visit: Payer: Self-pay | Admitting: Endocrinology

## 2012-06-26 DIAGNOSIS — E890 Postprocedural hypothyroidism: Secondary | ICD-10-CM

## 2012-06-29 ENCOUNTER — Other Ambulatory Visit: Payer: Self-pay | Admitting: Endocrinology

## 2012-06-29 DIAGNOSIS — E890 Postprocedural hypothyroidism: Secondary | ICD-10-CM

## 2012-06-29 LAB — TSH: TSH: 9.74 u[IU]/mL — ABNORMAL HIGH (ref 0.35–5.50)

## 2012-06-29 MED ORDER — LEVOTHYROXINE SODIUM 175 MCG PO TABS: 175.0000 ug | ORAL_TABLET | Freq: Every day | ORAL | Status: DC

## 2012-07-06 ENCOUNTER — Encounter (HOSPITAL_COMMUNITY): Payer: Self-pay | Admitting: Cardiology

## 2012-07-09 ENCOUNTER — Other Ambulatory Visit (HOSPITAL_COMMUNITY): Payer: Self-pay | Admitting: Physician Assistant

## 2012-07-15 ENCOUNTER — Other Ambulatory Visit (HOSPITAL_COMMUNITY): Payer: Self-pay | Admitting: Family Medicine

## 2012-07-20 ENCOUNTER — Other Ambulatory Visit (INDEPENDENT_AMBULATORY_CARE_PROVIDER_SITE_OTHER): Payer: BC Managed Care – PPO

## 2012-07-20 ENCOUNTER — Other Ambulatory Visit (HOSPITAL_COMMUNITY): Payer: Self-pay | Admitting: Adult Health

## 2012-07-20 ENCOUNTER — Other Ambulatory Visit: Payer: Self-pay | Admitting: Family Medicine

## 2012-07-20 DIAGNOSIS — E119 Type 2 diabetes mellitus without complications: Secondary | ICD-10-CM

## 2012-07-20 DIAGNOSIS — I10 Essential (primary) hypertension: Secondary | ICD-10-CM

## 2012-07-20 DIAGNOSIS — N179 Acute kidney failure, unspecified: Secondary | ICD-10-CM

## 2012-07-20 LAB — COMPREHENSIVE METABOLIC PANEL
ALT: 20 U/L (ref 0–35)
AST: 16 U/L (ref 0–37)
Albumin: 3.9 g/dL (ref 3.5–5.2)
Alkaline Phosphatase: 74 U/L (ref 39–117)
Potassium: 3.9 mEq/L (ref 3.5–5.1)
Sodium: 137 mEq/L (ref 135–145)
Total Protein: 7.3 g/dL (ref 6.0–8.3)

## 2012-07-20 LAB — HEMOGLOBIN A1C: Hgb A1c MFr Bld: 7.6 % — ABNORMAL HIGH (ref 4.6–6.5)

## 2012-07-27 ENCOUNTER — Ambulatory Visit (INDEPENDENT_AMBULATORY_CARE_PROVIDER_SITE_OTHER): Payer: BC Managed Care – PPO | Admitting: Family Medicine

## 2012-07-27 ENCOUNTER — Encounter: Payer: Self-pay | Admitting: Family Medicine

## 2012-07-27 VITALS — BP 122/72 | HR 76 | Temp 98.9°F | Ht 65.0 in | Wt 273.8 lb

## 2012-07-27 DIAGNOSIS — I10 Essential (primary) hypertension: Secondary | ICD-10-CM

## 2012-07-27 DIAGNOSIS — E079 Disorder of thyroid, unspecified: Secondary | ICD-10-CM

## 2012-07-27 DIAGNOSIS — R5383 Other fatigue: Secondary | ICD-10-CM

## 2012-07-27 DIAGNOSIS — R5381 Other malaise: Secondary | ICD-10-CM

## 2012-07-27 DIAGNOSIS — E119 Type 2 diabetes mellitus without complications: Secondary | ICD-10-CM

## 2012-07-27 NOTE — Assessment & Plan Note (Signed)
Post surgical hypothyroidism Pt feels quite poorly in general  Fatigue/ inc appetite/wt gain and moody with aches and pains tsh today Ref to endocrine Dr Wyonia Hough for further eval and mt

## 2012-07-27 NOTE — Assessment & Plan Note (Signed)
bp in fair control at this time  No changes needed  Disc lifstyle change with low sodium diet and exercise   

## 2012-07-27 NOTE — Assessment & Plan Note (Signed)
Suspect this may be from thyroid issues  tsh today and update Disc imp of exercise for general health

## 2012-07-27 NOTE — Patient Instructions (Addendum)
Work as hard as you can on low sugar diet Increase your lantus by 10 units  Keep track of sugars See [name] on the way out for referral to [name]  Follow up with me in about 3 months

## 2012-07-27 NOTE — Progress Notes (Signed)
Subjective:    Patient ID: [name], female    DOB: [birth year], 47 y.o.   MRN: [id number]  HPI Here for f/u of chronic health problems  She has not been feeling well  ? If from the levothyroxine  She has had some memory issues/ fuzzy headed / rapid mood swings/ muscles and joints hurt  Feels nervous and shaky at times  Is irritable  Has blood work today  Lab today and then sees Dr Everardo All in a year - not happy with him / poor communication  bp is stable today  No cp or palpitations or headaches or edema  No side effects to medicines  BP Readings from Last 3 Encounters:  07/27/12 122/72  04/28/12 130/70  04/27/12 116/76     Chemistry      Component Value Date/Time   NA 137 07/20/2012 0950   K 3.9 07/20/2012 0950   CL 100 07/20/2012 0950   CO2 31 07/20/2012 0950   BUN 13 07/20/2012 0950   CREATININE 0.7 07/20/2012 0950      Component Value Date/Time   CALCIUM 9.1 07/20/2012 0950   ALKPHOS 74 07/20/2012 0950   AST 16 07/20/2012 0950   ALT 20 07/20/2012 0950   BILITOT 0.4 07/20/2012 0950       Wt is up 12 lb with bmi of 45 Appetite increased   Diabetes Home sugar results  DM diet - way off track / appetite is up , and she stopped exercise  Symptoms no excessive thirst  A1C last  Lab Results  Component Value Date   HGBA1C 7.6* 07/20/2012  this is up from 7.4   No problems with medications  Last eye exam - going today for her diabetic eye exam     Patient Active Problem List   Diagnosis Date Noted  . Other malaise and fatigue 07/27/2012  . 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 Malignant neoplasm of thyroid gland 04/28/2012  . Papillary thyroid carcinoma 04/27/2012  . Chronic diastolic heart failure 02/10/2012  . UTI (lower urinary tract infection) 01/31/2012  . Acute renal failure 01/25/2012  . Diastolic CHF 01/25/2012  . Acute diastolic heart failure 01/17/2012  . OSA (obstructive sleep apnea) just got cpap at home this past week 01/17/12 01/17/2012   . Hypertensive urgency 01/16/2012  . CHF (congestive heart failure) 01/16/2012  . Anxiety 01/16/2012  . Chronic allergic rhinitis 10/26/2010  . Insomnia 10/26/2010  . OBESITY, MORBID 04/27/2010  . HEMORRHOIDS, INTERNAL, WITH BLEEDING 08/17/2009  . GERD 04/27/2008  . HYPERTENSION 11/03/2007  . HYPERLIPIDEMIA 05/14/2007  . OTHER ORGANIC SLEEP DISORDERS 03/09/2007  . THYROID DISORDER 09/11/2006  . DIABETES MELLITUS, TYPE II 09/11/2006  . POLYCYSTIC OVARIES 09/11/2006  . ANEMIA-IRON DEFICIENCY 09/11/2006  . DEPRESSION 09/11/2006  . EXTERNAL HEMORRHOIDS 09/11/2006  . ASTHMA 09/11/2006   Past Medical History  Diagnosis Date  . Anemia     iron deficiency  . Asthma     a. 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  . Depression     a. Stress reaction 08/2011 in multiple social stressors.  . Diabetes mellitus   . Hypertension   . Morbid obesity   . Hyperlipidemia   . Vertigo   . PCOS (polycystic ovarian syndrome)   . Gastritis   . Insomnia   . GERD (gastroesophageal reflux disease)   . OSA (obstructive sleep apnea) 01/17/2012  . CHF (congestive heart failure)   . Dysrhythmia   . Hyperthyroidism   .  Anxiety   . Chronic kidney disease     /13  . Hemorrhoid   . Arthritis   . Complication of anesthesia     DIFFICULT WAKING- 2011  . Papillary thyroid carcinoma    Past Surgical History  Procedure Laterality Date  . Cholecystectomy    . Hemorrhoid surgery    . Tubal ligation    . Shoulder surgery  06/2009  . Tonsillectomy and adenoidectomy  CHILD  . Intrauterine device insertion  2011  . Thyroidectomy  04/10/2012    Procedure: THYROIDECTOMY;  Surgeon: Osborn Coho, MD;  Location: [facility] OR;  Service: ENT;  Laterality: N/A;  Total Thyroidectomy   History  Substance Use Topics  . Smoking status: Former Smoker -- 0.20 packs/day for 5 years    Quit date: 03/25/2004  . Smokeless tobacco: Never Used  . Alcohol Use: No   Family History   Problem Relation Age of Onset  . Cancer Father     colon cancer  . Arthritis Father   . Arthritis Mother   . Asthma Mother   . Hypertension Mother    Allergies  Allergen Reactions  . Tape Rash    Plastic tape  . Buspirone Hcl Nausea Only  . Glipizide     REACTION: sleepy, low sugar  . Ivp Dye (Iodinated Diagnostic Agents)     Shut kidneys down  . Oxycodone-Acetaminophen Itching   Current Outpatient Prescriptions on File Prior to Visit  Medication Sig Dispense Refill  . albuterol (PROAIR HFA) 108 (90 BASE) MCG/ACT inhaler Inhale 2 puffs into the lungs every 4 (four) hours as needed. For wheezing  1 Inhaler  4  . buPROPion (WELLBUTRIN XL) 150 MG 24 hr tablet Take 1 tablet (150 mg total) by mouth daily.  30 tablet  11  . calcium-vitamin D (OSCAL) 250-125 MG-UNIT per tablet Take 1 tablet by mouth 3 (three) times daily.  60 tablet  1  . carvedilol (COREG) 3.125 MG tablet Take 1 tablet (3.125 mg total) by mouth 2 (two) times daily with a meal.  60 tablet  11  . ferrous sulfate 325 (65 FE) MG tablet Take 325 mg by mouth 2 (two) times daily.        . fluticasone (FLONASE) 50 MCG/ACT nasal spray Place 2 sprays into the nose daily as needed. For allergies      . HYDROcodone-acetaminophen (NORCO) 5-325 MG per tablet Take 1-2 tablets by mouth every 6 (six) hours as needed for pain.  30 tablet  0  . HYDROcodone-acetaminophen (NORCO/VICODIN) 5-325 MG per tablet Take 1-2 tablets by mouth every 4 (four) hours as needed.  30 tablet  2  . levothyroxine (SYNTHROID, LEVOTHROID) 175 MCG tablet Take 1 tablet (175 mcg total) by mouth daily before breakfast.  30 tablet  5  . loratadine (CLARITIN) 10 MG tablet Take 10 mg by mouth daily.      . ONE TOUCH ULTRA TEST test strip USE AS DIRECTED TO CHECK BLOOD SUGAR TWICE DAILY  100 each  1  . pantoprazole (PROTONIX) 40 MG tablet Take 1 tablet (40 mg total) by mouth daily.  30 tablet  11  . sertraline (ZOLOFT) 50 MG tablet Take 2 tablets (100 mg total) by mouth  daily.  60 tablet  11  . simvastatin (ZOCOR) 20 MG tablet Take 1 tablet (20 mg total) by mouth every evening.  30 tablet  11  . spironolactone (ALDACTONE) 25 MG tablet TAKE 1 TABLET EVERY DAY  30 tablet  3  .  torsemide (DEMADEX) 20 MG tablet Daily for weight 275 or greater.  30 tablet  5  . zolpidem (AMBIEN) 10 MG tablet Take 0.5-1 tablets (5-10 mg total) by mouth at bedtime as needed. For sleep  30 tablet  5   No current facility-administered medications on file prior to visit.    Review of Systems Review of Systems  Constitutional: Negative for fever, , and unexpected weight change. pos for inc in appetitie Eyes: Negative for pain and visual disturbance.  Respiratory: Negative for cough and shortness of breath.   Cardiovascular: Negative for cp or palpitations    Gastrointestinal: Negative for nausea, diarrhea and constipation.  Genitourinary: Negative for urgency and frequency.  Skin: Negative for pallor or rash   MSK pos for back and joint and muscle aches and pains, neg for joint swelling or redness  Neurological: Negative for weakness, light-headedness, numbness and headaches.  Hematological: Negative for adenopathy. Does not bruise/bleed easily.  Psychiatric/Behavioral: pos for dysphoric mood and anxiety.         Objective:   Physical Exam  Constitutional: She appears well-developed and well-nourished. No distress.  HENT:  Head: Normocephalic and atraumatic.  Mouth/Throat: Oropharynx is clear and moist.  Eyes: Conjunctivae and EOM are normal. Pupils are equal, round, and reactive to light. Right eye exhibits no discharge. Left eye exhibits no discharge. No scleral icterus.  Neck: Normal range of motion. Neck supple. No JVD present. Carotid bruit is not present. No thyromegaly present.  Thyroidectomy incision is well healed  Cardiovascular: Normal rate, regular rhythm, normal heart sounds and intact distal pulses.  Exam reveals no gallop.   Pulmonary/Chest: Effort normal and  breath sounds normal. No respiratory distress. She has no wheezes.  Abdominal: Soft. Bowel sounds are normal. She exhibits no distension and no mass. There is no tenderness.  Musculoskeletal: She exhibits no edema.  No acute joint changes  Lymphadenopathy:    She has no cervical adenopathy.  Neurological: She is alert. She has normal reflexes. She displays no tremor. No cranial nerve deficit. She exhibits normal muscle tone. Coordination normal.  Skin: Skin is warm and dry. No rash noted. No erythema. No pallor.  Psychiatric: Her speech is normal and behavior is normal. Her mood appears anxious. Her affect is not blunt and not labile.          Assessment & Plan:

## 2012-07-27 NOTE — Assessment & Plan Note (Addendum)
Lab Results  Component Value Date   HGBA1C 7.6* 07/20/2012    This is on the rise again with poor eating and bad habits Pt states this is due to malaise from thyroid problem and she has gained weight Will raise lantus by 10 u per week as tolerated Urged to check sugar bid

## 2012-07-28 LAB — TSH: TSH: 9.63 u[IU]/mL — ABNORMAL HIGH (ref 0.35–5.50)

## 2012-07-31 ENCOUNTER — Encounter: Payer: Self-pay | Admitting: Internal Medicine

## 2012-07-31 ENCOUNTER — Ambulatory Visit (INDEPENDENT_AMBULATORY_CARE_PROVIDER_SITE_OTHER): Payer: BC Managed Care – PPO | Admitting: Internal Medicine

## 2012-07-31 VITALS — BP 118/80 | HR 65 | Temp 98.6°F | Resp 12 | Ht 65.0 in | Wt 274.0 lb

## 2012-07-31 DIAGNOSIS — E890 Postprocedural hypothyroidism: Secondary | ICD-10-CM

## 2012-07-31 DIAGNOSIS — C73 Malignant neoplasm of thyroid gland: Secondary | ICD-10-CM

## 2012-07-31 NOTE — Progress Notes (Signed)
Subjective:     Patient ID: [name], female   DOB: [birth year], 47 y.o.   MRN: [id number]  HPI [name] is a pleasant 47 year old woman, referred by her PCP, Dr. Milinda Antis, for management of iatrogenic hypothyroidism, post total thyroidectomy for compressive goiter. He was incidentally found that she had a subcentimeter focus of papillary thyroid cancer, follicular variant.  The patient describes that in 12/2011, she was admitted for CHF and at that time she had carotid u/s that showed that her thyroid was enlarged. Up until then she has not been told that she has thyroid problems, however she did have neck compression symptoms, in the form of dysphagia and problems with shortness of breath. She does remember that at that time she developed acute renal failure from contrast administration for her CT scan. This has resolved since.  She had total thyroidectomy on 04/10/2012 by Dr Annalee Genta. Ever since then, she is feeling poorly, describing the following symptoms: - pain in whole body: shoulders, hips, legs - short term memory problems - mind foggy - problems with concentration at work - increased appetite - craves sweets - gained weight: 259 lbs right after surgery >> 274 now (15 lbs) - no constipation - hair loss but before the surgery (? If from BP meds) - skin burning and pulling sensation in back of calves - intolerance to heal - no sleep problems, on CPAP, started 01/2012, compliant with it  She is taking generic Levothyroxine 175 mcg in am around 10 am. This is increased from initially 100 mcg daily, then 150. She eats an apple or poptart with her meds. Lunch at 12 pm. She was taking calcium, not anymore. She is taking iron 325 mg bid, first dose along with the Levothyroxine. She takes Protonix in am along with Levothyroxine.  I reviewed her thyroid tests and they are: Lab Results  Component Value Date   TSH 9.63* 07/27/2012   TSH 9.74 Repeated and verified X2.* 06/26/2012   TSH  24.58* 05/22/2012   TSH 1.528 01/18/2012   TSH 2.90 04/27/2010   TSH 2.86 04/20/2008   She has FH of thyroid ds in mother. No FH of ThyCa. No personal RxTx history.   Last menstrual cycle in 1992. H/o PCOS. Of note, patient was able to lose 50 pounds in the last year, however she started to gain that back as mentioned above.  Review of Systems Constitutional: + weight gain, + fatigue, increased appetite, + subjective hyperthermia, nocturia Eyes: no blurry vision, no xerophthalmia ENT: no sore throat, no nodules palpated in throat, no dysphagia/odynophagia, no hoarseness Cardiovascular: no CP/SOB/palpitations/leg swelling Respiratory: no cough/SOB Gastrointestinal: no N/V/D/C Musculoskeletal: + both muscle/joint aches Skin: no rashes, + hair loss Neurological: no tremors/numbness/tingling/dizziness, + headaches Psychiatric: no depression/anxiety Low libido Past Medical History  Diagnosis Date  . Anemia     iron deficiency  . Asthma     a. 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  . Depression     a. Stress reaction 08/2011 in multiple social stressors.  . Diabetes mellitus   . Hypertension   . Morbid obesity   . Hyperlipidemia   . Vertigo   . PCOS (polycystic ovarian syndrome)   . Gastritis   . Insomnia   . GERD (gastroesophageal reflux disease)   . OSA (obstructive sleep apnea) 01/17/2012  . CHF (congestive heart failure)   . Dysrhythmia   . Hyperthyroidism   . Anxiety   . Chronic kidney  disease     /13  . Hemorrhoid   . Arthritis   . Complication of anesthesia     DIFFICULT WAKING- 2011  . Papillary thyroid carcinoma    Past Surgical History  Procedure Laterality Date  . Cholecystectomy    . Hemorrhoid surgery    . Tubal ligation    . Shoulder surgery  06/2009  . Tonsillectomy and adenoidectomy  CHILD  . Intrauterine device insertion  2011  . Thyroidectomy  04/10/2012    Procedure: THYROIDECTOMY;  Surgeon: Osborn Coho, MD;   Location: [facility] OR;  Service: ENT;  Laterality: N/A;  Total Thyroidectomy   History   Social History  . Marital Status: Married    Spouse Name: N/A    Number of Children: 2  . Years of Education: N/A   Occupational History  . Billing   Social History Main Topics  . Smoking status: Former Smoker -- 0.20 packs/day for 5 years    Quit date: 03/25/2004  . Smokeless tobacco: Never Used  . Alcohol Use: No  . Drug Use: No  . Sexually Active: Yes   Current Outpatient Prescriptions on File Prior to Visit  Medication Sig Dispense Refill  . albuterol (PROAIR HFA) 108 (90 BASE) MCG/ACT inhaler Inhale 2 puffs into the lungs every 4 (four) hours as needed. For wheezing  1 Inhaler  4  . buPROPion (WELLBUTRIN XL) 150 MG 24 hr tablet Take 1 tablet (150 mg total) by mouth daily.  30 tablet  11  . calcium-vitamin D (OSCAL) 250-125 MG-UNIT per tablet Take 1 tablet by mouth 3 (three) times daily.  60 tablet  1  . carvedilol (COREG) 3.125 MG tablet Take 1 tablet (3.125 mg total) by mouth 2 (two) times daily with a meal.  60 tablet  11  . ferrous sulfate 325 (65 FE) MG tablet Take 325 mg by mouth 2 (two) times daily.        . fluticasone (FLONASE) 50 MCG/ACT nasal spray Place 2 sprays into the nose daily as needed. For allergies      . HYDROcodone-acetaminophen (NORCO) 5-325 MG per tablet Take 1-2 tablets by mouth every 6 (six) hours as needed for pain.  30 tablet  0  . HYDROcodone-acetaminophen (NORCO/VICODIN) 5-325 MG per tablet Take 1-2 tablets by mouth every 4 (four) hours as needed.  30 tablet  2  . insulin glargine (LANTUS) 100 UNIT/ML injection Inject 40 Units into the skin at bedtime.      Marland Kitchen levothyroxine (SYNTHROID, LEVOTHROID) 175 MCG tablet Take 1 tablet (175 mcg total) by mouth daily before breakfast.  30 tablet  5  . loratadine (CLARITIN) 10 MG tablet Take 10 mg by mouth daily.      . ONE TOUCH ULTRA TEST test strip USE AS DIRECTED TO CHECK BLOOD SUGAR TWICE DAILY  100 each  1  . pantoprazole  (PROTONIX) 40 MG tablet Take 1 tablet (40 mg total) by mouth daily.  30 tablet  11  . sertraline (ZOLOFT) 50 MG tablet Take 2 tablets (100 mg total) by mouth daily.  60 tablet  11  . simvastatin (ZOCOR) 20 MG tablet Take 1 tablet (20 mg total) by mouth every evening.  30 tablet  11  . spironolactone (ALDACTONE) 25 MG tablet TAKE 1 TABLET EVERY DAY  30 tablet  3  . torsemide (DEMADEX) 20 MG tablet Daily for weight 275 or greater.  30 tablet  5  . zolpidem (AMBIEN) 10 MG tablet Take 0.5-1 tablets (5-10 mg  total) by mouth at bedtime as needed. For sleep  30 tablet  5   No current facility-administered medications on file prior to visit.    Allergies  Allergen Reactions  . Tape Rash    Plastic tape  . Buspirone Hcl Nausea Only  . Glipizide     REACTION: sleepy, low sugar  . Ivp Dye (Iodinated Diagnostic Agents)     Shut kidneys down  . Oxycodone-Acetaminophen Itching    Family History  Problem Relation Age of Onset  . Cancer Father     colon cancer  . Arthritis Father   . Arthritis Mother   . Asthma Mother   . Hypertension Mother     Objective:   Physical Exam BP 118/80  Pulse 65  Temp(Src) 98.6 F (37 C) (Oral)  Resp 12  Ht 5\' 5"  (1.651 m)  Wt 274 lb (124.286 kg)  BMI 45.6 kg/m2  SpO2 97% Wt Readings from Last 3 Encounters:  07/31/12 274 lb (124.286 kg)  07/27/12 273 lb 12 oz (124.172 kg)  04/28/12 261 lb (118.389 kg)  Constitutional: truncal obesity, full supraclavicular fat pads, in NAD, occasionally tearful during appt Eyes: PERRLA, EOMI, no exophthalmos, injected conjunctiva ENT: moist mucous membranes, no thyromegaly, no cervical lymphadenopathy Cardiovascular: RRR, No MRG Respiratory: CTA B Gastrointestinal: abdomen soft, NT, ND, BS+ Musculoskeletal: no deformities, strength intact in all 4 Skin: moist, warm, acanthosis nigricans on neck, healed surgical scar on the lower neck Neurological: no tremor with outstretched hands, DTR normal in all 4  Assessment:      1. Goiter, s/p total thyroidectomy 04/10/2012 by Dr. Annalee Genta  2. Papillary thyroid cancer, follicular variant - 0.9 cm, without extension outside the capsule and without lymphatic and vascular invasion - no radioactive iodine ablation performed  3. Postsurgical hypothyroidism - On levothyroxine 175 mcg daily - Takes levothyroxine along with calcium, and iron - Last TSH 9.63 on 07/27/2012, after which her dose of Synthroid was increased to 175    Plan:     1. Goiter - Patient had relief of her neck compressive symptoms after total thyroidectomy for her goiter, however she has severe symptoms of hypothyroidism secondary to poor absorption of levothyroxine and persistently high TSH levels.  2. Postsurgical hypothyroidism - Despite increasing the dose of levothyroxine to 175 mcg daily, patient's TSH remains at 9, and I suspect that this is due to poor absorption of the medication, since the patient is taking the medicine with food, iron tablets, and Protonix - We discussed about correct taking of the levothyroxine, with water, separated by for the by at least 30 minutes, and also separated by her medications by >30 minutes, however, separated by iron, and PPI by at least 4 hours - for now, will continue the same dose of levothyroxine, and will recheck thyroid tests in 4-6 weeks  3. Subcentimeter papillary thyroid cancer - I agree with not performing radioactive iodine ablation for such a small cancer, without invasion of the capsule and evidence of extension outside the tumor margin - We will need to keep the TSH in the low normal range - I advised her to let me know she feels nodules in her throat - We will need to perform thyroid ultrasounds in the future, although, I would consider her cured

## 2012-07-31 NOTE — Patient Instructions (Addendum)
Please return in 6-8 weeks for thyroid labs. Please take the Levothyroxine separated by >30 min from breakfast, and >4h from acid reflux medication, calcium, iron, multivitamins. Try Coenzyme Q for muscle pain.

## 2012-08-14 ENCOUNTER — Other Ambulatory Visit: Payer: Self-pay | Admitting: *Deleted

## 2012-08-18 ENCOUNTER — Other Ambulatory Visit (HOSPITAL_COMMUNITY): Payer: Self-pay | Admitting: Internal Medicine

## 2012-08-18 ENCOUNTER — Ambulatory Visit (HOSPITAL_BASED_OUTPATIENT_CLINIC_OR_DEPARTMENT_OTHER)
Admission: RE | Admit: 2012-08-18 | Discharge: 2012-08-18 | Disposition: A | Discharge status: Home / Self Care | Payer: BC Managed Care – PPO | Source: Ambulatory Visit | Attending: Internal Medicine | Admitting: Internal Medicine

## 2012-08-18 ENCOUNTER — Ambulatory Visit (HOSPITAL_COMMUNITY)
Admission: RE | Admit: 2012-08-18 | Discharge: 2012-08-18 | Disposition: A | Discharge status: Home / Self Care | Payer: BC Managed Care – PPO | Source: Ambulatory Visit | Attending: Internal Medicine | Admitting: Internal Medicine

## 2012-08-18 ENCOUNTER — Encounter (HOSPITAL_COMMUNITY): Payer: Self-pay

## 2012-08-18 VITALS — BP 130/82 | HR 67 | Wt 278.1 lb

## 2012-08-18 DIAGNOSIS — F411 Generalized anxiety disorder: Secondary | ICD-10-CM | POA: Insufficient documentation

## 2012-08-18 DIAGNOSIS — I509 Heart failure, unspecified: Secondary | ICD-10-CM | POA: Insufficient documentation

## 2012-08-18 DIAGNOSIS — E119 Type 2 diabetes mellitus without complications: Secondary | ICD-10-CM | POA: Insufficient documentation

## 2012-08-18 DIAGNOSIS — G4733 Obstructive sleep apnea (adult) (pediatric): Secondary | ICD-10-CM | POA: Insufficient documentation

## 2012-08-18 DIAGNOSIS — I10 Essential (primary) hypertension: Secondary | ICD-10-CM | POA: Insufficient documentation

## 2012-08-18 DIAGNOSIS — Z6841 Body Mass Index (BMI) 40.0 and over, adult: Secondary | ICD-10-CM | POA: Insufficient documentation

## 2012-08-18 DIAGNOSIS — Z91199 Patient's noncompliance with other medical treatment and regimen due to unspecified reason: Secondary | ICD-10-CM | POA: Insufficient documentation

## 2012-08-18 DIAGNOSIS — I517 Cardiomegaly: Secondary | ICD-10-CM

## 2012-08-18 DIAGNOSIS — Z87891 Personal history of nicotine dependence: Secondary | ICD-10-CM | POA: Insufficient documentation

## 2012-08-18 DIAGNOSIS — Z9119 Patient's noncompliance with other medical treatment and regimen: Secondary | ICD-10-CM | POA: Insufficient documentation

## 2012-08-18 DIAGNOSIS — I5032 Chronic diastolic (congestive) heart failure: Secondary | ICD-10-CM

## 2012-08-18 NOTE — Patient Instructions (Addendum)
Follow up in 6 months 

## 2012-08-18 NOTE — Progress Notes (Signed)
Echocardiogram 2D Echocardiogram has been performed.  [name] 08/18/2012, 10:06 AM

## 2012-08-18 NOTE — Progress Notes (Signed)
Patient ID: [name], female   DOB: 10/02/1965, 47 y.o.   MRN: [id number]   Weight Range   Baseline proBNP   PCP: Dr Lucretia Roers  Pulmonary: Dr. [name] ENT: Dr Lendon Collar Endocrinologist: Dr Lafe Garin  HPI: [name] is a 47 y/o F hx of DM, HTN, HL, morbid obesity, OSA noncompliant with CPAP due to finances, anxiety whose cardiac hx includes absence of coronary artery disease on cath in 2006 for CP with only catheter-induced spasm of RCA. EF was 65% at that time. Developed acute renal failure after CT contrast.   Nuclear Stress 02/17/12: Normal, EF 58% Echo 10/24: EF 50-55%. Grade 2 diastolic dysfunction. RV mildly dilated. PAPP 68 mmHg  08/18/12 ECHO EF 60% Grade 2 diastolic dysfunction. RV looks good. No significant TR to measure PA pressure.   S/P Thyroidectomy January 2014.   She returns for follow up today.  Denies SOB/PND/Orthopnea. Complains of fatigue. Weight at home 274 pounds. Continues to use CPAP. Feels better on CPAP. Continues to work full time at [employer].     ROS: All systems negative except as listed in HPI, PMH and Problem List.  Past Medical History  Diagnosis Date  . Anemia     iron deficiency  . Asthma     a. 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  . Depression     a. Stress reaction 08/2011 in multiple social stressors.  . Diabetes mellitus   . Hypertension   . Morbid obesity   . Hyperlipidemia   . Vertigo   . PCOS (polycystic ovarian syndrome)   . Gastritis   . Insomnia   . GERD (gastroesophageal reflux disease)   . OSA (obstructive sleep apnea) 01/17/2012  . CHF (congestive heart failure)   . Dysrhythmia   . Hyperthyroidism   . Anxiety   . Chronic kidney disease     /13  . Hemorrhoid   . Arthritis   . Complication of anesthesia     DIFFICULT WAKING- 2011  . Papillary thyroid carcinoma     Current Outpatient Prescriptions  Medication Sig Dispense Refill  . albuterol (PROAIR HFA) 108 (90 BASE)  MCG/ACT inhaler Inhale 2 puffs into the lungs every 4 (four) hours as needed. For wheezing  1 Inhaler  4  . buPROPion (WELLBUTRIN XL) 150 MG 24 hr tablet Take 1 tablet (150 mg total) by mouth daily.  30 tablet  11  . carvedilol (COREG) 3.125 MG tablet Take 1 tablet (3.125 mg total) by mouth 2 (two) times daily with a meal.  60 tablet  11  . cetirizine (ZYRTEC) 10 MG tablet Take 10 mg by mouth daily.      . dorzolamide (TRUSOPT) 2 % ophthalmic solution 1 drop 3 (three) times daily.      . ferrous sulfate 325 (65 FE) MG tablet Take 325 mg by mouth 2 (two) times daily.        . fluticasone (FLONASE) 50 MCG/ACT nasal spray Place 2 sprays into the nose daily as needed. For allergies      . insulin glargine (LANTUS) 100 UNIT/ML injection Inject 40 Units into the skin at bedtime.      Marland Kitchen levothyroxine (SYNTHROID, LEVOTHROID) 175 MCG tablet Take 1 tablet (175 mcg total) by mouth daily before breakfast.  30 tablet  5  . ONE TOUCH ULTRA TEST test strip USE AS DIRECTED TO CHECK BLOOD SUGAR TWICE DAILY  100 each  1  . pantoprazole (PROTONIX) 40 MG  tablet Take 1 tablet (40 mg total) by mouth daily.  30 tablet  11  . sertraline (ZOLOFT) 50 MG tablet Take 2 tablets (100 mg total) by mouth daily.  60 tablet  11  . simvastatin (ZOCOR) 20 MG tablet Take 1 tablet (20 mg total) by mouth every evening.  30 tablet  11  . spironolactone (ALDACTONE) 25 MG tablet TAKE 1 TABLET EVERY DAY  30 tablet  3  . torsemide (DEMADEX) 20 MG tablet Daily for weight 275 or greater.  30 tablet  5  . zolpidem (AMBIEN) 10 MG tablet Take 0.5-1 tablets (5-10 mg total) by mouth at bedtime as needed. For sleep  30 tablet  5  . calcium-vitamin D (OSCAL) 250-125 MG-UNIT per tablet Take 1 tablet by mouth 3 (three) times daily.  60 tablet  1   No current facility-administered medications for this encounter.     PHYSICAL EXAM: Filed Vitals:   08/18/12 0937  BP: 130/82  Pulse: 67  Weight: 278 lb 1.9 oz (126.154 kg)  SpO2: 97%    General:   Obese. No resp difficulty HEENT: normal Neck: supple. JVP unable to assess due to body habitus.Arppears flat Carotids 2+ bilaterally; no bruits. No lymphadenopathy or thryomegaly appreciated. Scar Cor: PMI normal. Regular rate & rhythm. No rubs, gallops or murmurs. Lungs: clear Abdomen: obese, soft, nontender, nondistended. No hepatosplenomegaly. No bruits or masses. Good bowel sounds. Extremities: no cyanosis, clubbing, rash, edema Neuro: alert & orientedx3, cranial nerves grossly intact. Moves all 4 extremities w/o difficulty. Affect pleasant.      ASSESSMENT & PLAN:

## 2012-08-18 NOTE — Assessment & Plan Note (Addendum)
Volume status stable despite weight gain. Dr Gala Romney discussed and  reviewed ECHO. No signigicant TR to measure PAP. Follow up in 6 months to reassess volume status.   Patient seen and examined with Tonye Becket, NP. We discussed all aspects of the encounter. I agree with the assessment and plan as stated above. Overall doing well. Volume status looks good. Echo reviewed personally. EF normal. Grade II DD. No significant TR to measure RVSP but no evidence of RV strain. Will continue current therapy. Reinforced need for daily weights and reviewed use of sliding scale diuretics.

## 2012-09-03 LAB — HM DIABETES EYE EXAM

## 2012-09-04 ENCOUNTER — Ambulatory Visit (INDEPENDENT_AMBULATORY_CARE_PROVIDER_SITE_OTHER): Payer: BC Managed Care – PPO

## 2012-09-04 DIAGNOSIS — E890 Postprocedural hypothyroidism: Secondary | ICD-10-CM

## 2012-09-28 ENCOUNTER — Other Ambulatory Visit: Payer: Self-pay | Admitting: Family Medicine

## 2012-09-28 NOTE — Telephone Encounter (Signed)
Px written for call in   

## 2012-09-28 NOTE — Telephone Encounter (Signed)
Electronic refill request, please advise  

## 2012-09-29 NOTE — Telephone Encounter (Signed)
Rx called in as prescribed 

## 2012-10-13 ENCOUNTER — Telehealth: Payer: Self-pay

## 2012-10-13 NOTE — Telephone Encounter (Signed)
Pt has developed vaginal and perineal itching, no discharge and pt request med to [facility]. Pt has not recently taken antibiotic. Explained to pt will need to be seen to make sure of cause of itching and type treatment needed. Pt said she cannot come for appt until 10/16/12. Pt scheduled appt 10/16/12 and advised if condition changes or worsens to cb. Pt wanted note sent to Dr Milinda Antis to see if will call med in.Please advise.

## 2012-10-13 NOTE — Telephone Encounter (Signed)
Pt advised me that she has tried monistat the 3 day course and the 1 day and neither has helped she has been dealing with her sxs for over 3 weeks, I advise Dr. Milinda Antis and she said pt would need to be seen to evaluate her, I advise pt

## 2012-10-13 NOTE — Telephone Encounter (Signed)
Just in case it is yeast - advise her to get a yeast product otc like monistat if she wants to try that first

## 2012-10-16 ENCOUNTER — Encounter: Payer: Self-pay | Admitting: Family Medicine

## 2012-10-16 ENCOUNTER — Ambulatory Visit (INDEPENDENT_AMBULATORY_CARE_PROVIDER_SITE_OTHER): Payer: BC Managed Care – PPO | Admitting: Family Medicine

## 2012-10-16 VITALS — BP 150/90 | HR 75 | Temp 98.8°F | Ht 65.0 in | Wt 281.0 lb

## 2012-10-16 DIAGNOSIS — B373 Candidiasis of vulva and vagina: Secondary | ICD-10-CM

## 2012-10-16 MED ORDER — FLUCONAZOLE 150 MG PO TABS: ORAL_TABLET | ORAL | Status: DC

## 2012-10-16 NOTE — Progress Notes (Signed)
Subjective:    Patient ID: [name], female    DOB: 24-May-1965, 47 y.o.   MRN: [id number]  HPI About 3 weeks of vaginal itch Tried 3 d and and 1 d monistat- only helped briefly --last over a week ago  No vaginal discharge  More itch on the inside   No abx lately   No odor  No new sexual contacts - no worries about STDs  Patient Active Problem List   Diagnosis Date Noted  . Yeast vaginitis 10/16/2012  . Other malaise and fatigue 07/27/2012  . 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 Malignant neoplasm of thyroid gland 04/28/2012  . Papillary thyroid carcinoma 04/27/2012  . Chronic diastolic heart failure 02/10/2012  . UTI (lower urinary tract infection) 01/31/2012  . Acute renal failure 01/25/2012  . Diastolic CHF 01/25/2012  . Acute diastolic heart failure 01/17/2012  . OSA (obstructive sleep apnea) just got cpap at home this past week 01/17/12 01/17/2012  . Hypertensive urgency 01/16/2012  . CHF (congestive heart failure) 01/16/2012  . Anxiety 01/16/2012  . Chronic allergic rhinitis 10/26/2010  . Insomnia 10/26/2010  . OBESITY, MORBID 04/27/2010  . HEMORRHOIDS, INTERNAL, WITH BLEEDING 08/17/2009  . GERD 04/27/2008  . HYPERTENSION 11/03/2007  . HYPERLIPIDEMIA 05/14/2007  . OTHER ORGANIC SLEEP DISORDERS 03/09/2007  . DIABETES MELLITUS, TYPE II 09/11/2006  . POLYCYSTIC OVARIES 09/11/2006  . ANEMIA-IRON DEFICIENCY 09/11/2006  . DEPRESSION 09/11/2006  . EXTERNAL HEMORRHOIDS 09/11/2006  . ASTHMA 09/11/2006   Past Medical History  Diagnosis Date  . Anemia     iron deficiency  . Asthma     a. 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  . Depression     a. Stress reaction 08/2011 in multiple social stressors.  . Diabetes mellitus   . Hypertension   . Morbid obesity   . Hyperlipidemia   . Vertigo   . PCOS (polycystic ovarian syndrome)   . Gastritis   . Insomnia   . GERD (gastroesophageal reflux disease)   . OSA  (obstructive sleep apnea) 01/17/2012  . CHF (congestive heart failure)   . Dysrhythmia   . Hyperthyroidism   . Anxiety   . Chronic kidney disease     /13  . Hemorrhoid   . Arthritis   . Complication of anesthesia     DIFFICULT WAKING- 2011  . Papillary thyroid carcinoma    Past Surgical History  Procedure Laterality Date  . Cholecystectomy    . Hemorrhoid surgery    . Tubal ligation    . Shoulder surgery  06/2009  . Tonsillectomy and adenoidectomy  CHILD  . Intrauterine device insertion  2011  . Thyroidectomy  04/10/2012    Procedure: THYROIDECTOMY;  Surgeon: Osborn Coho, MD;  Location: [facility] OR;  Service: ENT;  Laterality: N/A;  Total Thyroidectomy   History  Substance Use Topics  . Smoking status: Former Smoker -- 0.20 packs/day for 5 years    Quit date: 03/25/2004  . Smokeless tobacco: Never Used  . Alcohol Use: No   Family History  Problem Relation Age of Onset  . Cancer Father     colon cancer  . Arthritis Father   . Arthritis Mother   . Asthma Mother   . Hypertension Mother    Allergies  Allergen Reactions  . Tape Rash    Plastic tape  . Buspirone Hcl Nausea Only  . Glipizide     REACTION: sleepy, low sugar  . Ivp Dye (Iodinated Diagnostic Agents)  Shut kidneys down  . Oxycodone-Acetaminophen Itching   Current Outpatient Prescriptions on File Prior to Visit  Medication Sig Dispense Refill  . albuterol (PROAIR HFA) 108 (90 BASE) MCG/ACT inhaler Inhale 2 puffs into the lungs every 4 (four) hours as needed. For wheezing  1 Inhaler  4  . buPROPion (WELLBUTRIN XL) 150 MG 24 hr tablet Take 1 tablet (150 mg total) by mouth daily.  30 tablet  11  . carvedilol (COREG) 3.125 MG tablet Take 1 tablet (3.125 mg total) by mouth 2 (two) times daily with a meal.  60 tablet  11  . cetirizine (ZYRTEC) 10 MG tablet Take 10 mg by mouth daily.      . dorzolamide (TRUSOPT) 2 % ophthalmic solution 1 drop 3 (three) times daily.      . ferrous sulfate 325 (65 FE) MG tablet  Take 325 mg by mouth 2 (two) times daily.        . fluticasone (FLONASE) 50 MCG/ACT nasal spray Place 2 sprays into the nose daily as needed. For allergies      . insulin glargine (LANTUS) 100 UNIT/ML injection Inject 40 Units into the skin at bedtime.      Marland Kitchen levothyroxine (SYNTHROID, LEVOTHROID) 175 MCG tablet Take 1 tablet (175 mcg total) by mouth daily before breakfast.  30 tablet  5  . ONE TOUCH ULTRA TEST test strip USE AS DIRECTED TO CHECK BLOOD SUGAR TWICE DAILY  100 each  1  . pantoprazole (PROTONIX) 40 MG tablet Take 1 tablet (40 mg total) by mouth daily.  30 tablet  11  . sertraline (ZOLOFT) 50 MG tablet Take 2 tablets (100 mg total) by mouth daily.  60 tablet  11  . simvastatin (ZOCOR) 20 MG tablet Take 1 tablet (20 mg total) by mouth every evening.  30 tablet  11  . spironolactone (ALDACTONE) 25 MG tablet TAKE 1 TABLET EVERY DAY  30 tablet  3  . torsemide (DEMADEX) 20 MG tablet Daily for weight 275 or greater.  30 tablet  5  . zolpidem (AMBIEN) 10 MG tablet TAKE 1/2 TO 1 TABLET BY MOUTH AT BEDTIME AS NEEDED FOR SLEEP  30 tablet  5   No current facility-administered medications on file prior to visit.    Review of Systems Review of Systems  Constitutional: Negative for fever, appetite change, fatigue and unexpected weight change.  Eyes: Negative for pain and visual disturbance.  Respiratory: Negative for cough and shortness of breath.   Cardiovascular: Negative for cp or palpitations    Gastrointestinal: Negative for nausea, diarrhea and constipation.  Genitourinary: Negative for urgency and frequency. neg for dysuria/ pos for vaginal itching  Skin: Negative for pallor or rash   Neurological: Negative for weakness, light-headedness, numbness and headaches.  Hematological: Negative for adenopathy. Does not bruise/bleed easily.  Psychiatric/Behavioral: Negative for dysphoric mood. The patient is not nervous/anxious.         Objective:   Physical Exam  Constitutional: She  appears well-developed and well-nourished. No distress.  obese and well appearing   Eyes: Conjunctivae and EOM are normal. Pupils are equal, round, and reactive to light.  Neck: Normal range of motion. Neck supple.  Cardiovascular: Normal rate and regular rhythm.   Abdominal: Soft. Bowel sounds are normal. She exhibits no distension. There is no tenderness.  Genitourinary: Vaginal discharge found.  Scant white vaginal d/c Mild hyperemia of labia minora No excoriations  Lymphadenopathy:    She has no cervical adenopathy.  Neurological: She is alert.  Skin: Skin is warm and dry. No rash noted. There is erythema. No pallor.  Psychiatric: She has a normal mood and affect.          Assessment & Plan:

## 2012-10-16 NOTE — Patient Instructions (Addendum)
Take the diflucan as directed and hold the cholesterol medicine the days you take it Update if not starting to improve in a week or if worsening

## 2012-10-18 LAB — POCT WET PREP (WET MOUNT)

## 2012-10-18 NOTE — Assessment & Plan Note (Signed)
Unresponsive to otc agents  Pos wet prep today for yeast tx with diflucan 3 doses over 6 days Update if not starting to improve in a week or if worsening

## 2012-10-19 ENCOUNTER — Telehealth: Payer: Self-pay | Admitting: Family Medicine

## 2012-10-19 ENCOUNTER — Encounter: Payer: Self-pay | Admitting: *Deleted

## 2012-10-19 DIAGNOSIS — E119 Type 2 diabetes mellitus without complications: Secondary | ICD-10-CM

## 2012-10-19 NOTE — Telephone Encounter (Signed)
Message copied by Judy Pimple on Mon Oct 19, 2012  9:41 PM ------      Message from: Alvina Chou      Created: Thu Oct 08, 2012  2:55 PM      Regarding: Lab orders for Tuesday, 7.29.14       Labs for a 3 month f/u ------

## 2012-10-20 ENCOUNTER — Other Ambulatory Visit (INDEPENDENT_AMBULATORY_CARE_PROVIDER_SITE_OTHER): Payer: BC Managed Care – PPO

## 2012-10-20 DIAGNOSIS — E119 Type 2 diabetes mellitus without complications: Secondary | ICD-10-CM

## 2012-10-20 LAB — MICROALBUMIN / CREATININE URINE RATIO: Microalb Creat Ratio: 3.4 mg/g (ref 0.0–30.0)

## 2012-10-27 ENCOUNTER — Other Ambulatory Visit: Payer: Self-pay | Admitting: Family Medicine

## 2012-10-27 ENCOUNTER — Encounter: Payer: Self-pay | Admitting: Family Medicine

## 2012-10-27 ENCOUNTER — Ambulatory Visit (INDEPENDENT_AMBULATORY_CARE_PROVIDER_SITE_OTHER): Payer: BC Managed Care – PPO | Admitting: Family Medicine

## 2012-10-27 ENCOUNTER — Encounter: Payer: Self-pay | Admitting: *Deleted

## 2012-10-27 VITALS — BP 142/72 | HR 80 | Temp 99.0°F | Ht 65.0 in | Wt 282.0 lb

## 2012-10-27 DIAGNOSIS — I10 Essential (primary) hypertension: Secondary | ICD-10-CM

## 2012-10-27 DIAGNOSIS — E119 Type 2 diabetes mellitus without complications: Secondary | ICD-10-CM

## 2012-10-27 DIAGNOSIS — F329 Major depressive disorder, single episode, unspecified: Secondary | ICD-10-CM

## 2012-10-27 DIAGNOSIS — R892 Abnormal level of other drugs, medicaments and biological substances in specimens from other organs, systems and tissues: Secondary | ICD-10-CM

## 2012-10-27 NOTE — Patient Instructions (Addendum)
Try to get back to diet and exercise for diabetes  Your A1C is up to 10.7- much worse  Increase lantus to 50 units this week and then 60 units daily the following week - watching for low sugars and let me know if any problems I will ask Dr Elvera Lennox if she can help [place] out with your diabetes  Increase zoloft to 100 mg daily - for panic attack/ mood problems and anxiety- if you feel worse or have side effects let me know  Follow up with me in 1-2 months for mood/ anxiety

## 2012-10-27 NOTE — Progress Notes (Signed)
Subjective:    Patient ID: [name], female    DOB: [birth year], 47 y.o.   MRN: [id number]  HPI Here for f/u of DM  Wt is up 1 lb with bmi of 46- obese  Diabetes Home sugar results --are all over the place - ams 180s and higher than that later in the day - goes above 200 a lot  DM diet - has changed some since her thyroid dx and surgery - eating ice cream and also eats too much  She is under a lot of stress! Her mood is poor (also working 2 jobs) Exercise - works as a Child psychotherapist , no extra  Symptoms  -mouth stays dry and she is thirsty a lot and urinates a lot  A1C last  Lab Results  Component Value Date   HGBA1C 10.7* 10/20/2012   This is up from the 7's No problems with medications - is taking lantus 40 units  Nl microalbumin Last eye exam    Had routine tox screen She had ambien testing pos- as expected but also alcohol unexpectedly-- and she does not know where it came from  Lab was on 7/29 Had 2 mixed drinks 7/27 in the evening    Will see Dr Elvera Lennox in sept  Lab Results  Component Value Date   TSH 1.86 09/04/2012    Thinks her emotional state is bad  Panic attack one time-almost had to go to the ER  Stressors- father has CAD at 76 and declines further care , and her mother has MS -she has to do a lot for her She gets no help with that   Husband is without a job and is in bad health   Does not drink alcohol except occasionally    Patient Active Problem List   Diagnosis Date Noted  . Yeast vaginitis 10/16/2012  . Other malaise and fatigue 07/27/2012  . 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 Malignant neoplasm of thyroid gland 04/28/2012  . Papillary thyroid carcinoma 04/27/2012  . Chronic diastolic heart failure 02/10/2012  . Acute renal failure 01/25/2012  . Diastolic CHF 01/25/2012  . Acute diastolic heart failure 01/17/2012  . OSA (obstructive sleep apnea) just got cpap at home this past week 01/17/12 01/17/2012  . Hypertensive urgency 01/16/2012   . CHF (congestive heart failure) 01/16/2012  . Anxiety 01/16/2012  . Chronic allergic rhinitis 10/26/2010  . Insomnia 10/26/2010  . OBESITY, MORBID 04/27/2010  . HEMORRHOIDS, INTERNAL, WITH BLEEDING 08/17/2009  . GERD 04/27/2008  . HYPERTENSION 11/03/2007  . HYPERLIPIDEMIA 05/14/2007  . OTHER ORGANIC SLEEP DISORDERS 03/09/2007  . DIABETES MELLITUS, TYPE II 09/11/2006  . POLYCYSTIC OVARIES 09/11/2006  . ANEMIA-IRON DEFICIENCY 09/11/2006  . DEPRESSION 09/11/2006  . EXTERNAL HEMORRHOIDS 09/11/2006  . ASTHMA 09/11/2006   Past Medical History  Diagnosis Date  . Anemia     iron deficiency  . Asthma     a. 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  . Depression     a. Stress reaction 08/2011 in multiple social stressors.  . Diabetes mellitus   . Hypertension   . Morbid obesity   . Hyperlipidemia   . Vertigo   . PCOS (polycystic ovarian syndrome)   . Gastritis   . Insomnia   . GERD (gastroesophageal reflux disease)   . OSA (obstructive sleep apnea) 01/17/2012  . CHF (congestive heart failure)   . Dysrhythmia   . Hyperthyroidism   . Anxiety   . Chronic kidney disease     /  13  . Hemorrhoid   . Arthritis   . Complication of anesthesia     DIFFICULT WAKING- 2011  . Papillary thyroid carcinoma    Past Surgical History  Procedure Laterality Date  . Cholecystectomy    . Hemorrhoid surgery    . Tubal ligation    . Shoulder surgery  06/2009  . Tonsillectomy and adenoidectomy  CHILD  . Intrauterine device insertion  2011  . Thyroidectomy  04/10/2012    Procedure: THYROIDECTOMY;  Surgeon: Osborn Coho, MD;  Location: [facility] OR;  Service: ENT;  Laterality: N/A;  Total Thyroidectomy   History  Substance Use Topics  . Smoking status: Former Smoker -- 0.20 packs/day for 5 years    Quit date: 03/25/2004  . Smokeless tobacco: Never Used  . Alcohol Use: No   Family History  Problem Relation Age of Onset  . Cancer Father     colon cancer  .  Arthritis Father   . Arthritis Mother   . Asthma Mother   . Hypertension Mother    Allergies  Allergen Reactions  . Tape Rash    Plastic tape  . Buspirone Hcl Nausea Only  . Glipizide     REACTION: sleepy, low sugar  . Ivp Dye (Iodinated Diagnostic Agents)     Shut kidneys down  . Oxycodone-Acetaminophen Itching   Current Outpatient Prescriptions on File Prior to Visit  Medication Sig Dispense Refill  . albuterol (PROAIR HFA) 108 (90 BASE) MCG/ACT inhaler Inhale 2 puffs into the lungs every 4 (four) hours as needed. For wheezing  1 Inhaler  4  . buPROPion (WELLBUTRIN XL) 150 MG 24 hr tablet Take 1 tablet (150 mg total) by mouth daily.  30 tablet  11  . carvedilol (COREG) 3.125 MG tablet Take 1 tablet (3.125 mg total) by mouth 2 (two) times daily with a meal.  60 tablet  11  . cetirizine (ZYRTEC) 10 MG tablet Take 10 mg by mouth daily.      . dorzolamide (TRUSOPT) 2 % ophthalmic solution 1 drop 3 (three) times daily.      . ferrous sulfate 325 (65 FE) MG tablet Take 325 mg by mouth 2 (two) times daily.        . fluticasone (FLONASE) 50 MCG/ACT nasal spray Place 2 sprays into the nose daily as needed. For allergies      . insulin glargine (LANTUS) 100 UNIT/ML injection Inject 40 Units into the skin at bedtime.      Marland Kitchen levothyroxine (SYNTHROID, LEVOTHROID) 175 MCG tablet Take 1 tablet (175 mcg total) by mouth daily before breakfast.  30 tablet  5  . ONE TOUCH ULTRA TEST test strip USE AS DIRECTED TO CHECK BLOOD SUGAR TWICE DAILY  100 each  1  . pantoprazole (PROTONIX) 40 MG tablet Take 1 tablet (40 mg total) by mouth daily.  30 tablet  11  . sertraline (ZOLOFT) 50 MG tablet Take 2 tablets (100 mg total) by mouth daily.  60 tablet  11  . simvastatin (ZOCOR) 20 MG tablet Take 1 tablet (20 mg total) by mouth every evening.  30 tablet  11  . spironolactone (ALDACTONE) 25 MG tablet TAKE 1 TABLET EVERY DAY  30 tablet  3  . torsemide (DEMADEX) 20 MG tablet Daily for weight 275 or greater.  30  tablet  5  . zolpidem (AMBIEN) 10 MG tablet TAKE 1/2 TO 1 TABLET BY MOUTH AT BEDTIME AS NEEDED FOR SLEEP  30 tablet  5   No current  facility-administered medications on file prior to visit.    Review of Systems Review of Systems  Constitutional: Negative for fever, appetite change,  and unexpected weight change. pos for fatigue Eyes: Negative for pain and visual disturbance.  Respiratory: Negative for cough and shortness of breath.   Cardiovascular: Negative for cp or palpitations    Gastrointestinal: Negative for nausea, diarrhea and constipation.  Genitourinary: Negative for urgency and frequency.  Skin: Negative for pallor or rash   Neurological: Negative for weakness, light-headedness, numbness and headaches.  Hematological: Negative for adenopathy. Does not bruise/bleed easily.  Psychiatric/Behavioral: pos for dysphoric mood. The patient is also nervous/anxious. No SI         Objective:   Physical Exam  Constitutional: She appears well-developed and well-nourished. No distress.  obese and well appearing   HENT:  Head: Normocephalic and atraumatic.  Mouth/Throat: Oropharynx is clear and moist.  Eyes: Conjunctivae and EOM are normal. Pupils are equal, round, and reactive to light. Right eye exhibits no discharge. Left eye exhibits no discharge. No scleral icterus.  Neck: Normal range of motion. Neck supple. No JVD present. Carotid bruit is not present. No thyromegaly present.  Cardiovascular: Normal rate, regular rhythm, normal heart sounds and intact distal pulses.  Exam reveals no gallop.   Pulmonary/Chest: Effort normal and breath sounds normal. No respiratory distress. She has no wheezes. She has no rales.  Abdominal: Soft. Bowel sounds are normal. She exhibits no distension, no abdominal bruit and no mass. There is no tenderness.  Musculoskeletal: She exhibits no edema and no tenderness.  Lymphadenopathy:    She has no cervical adenopathy.  Neurological: She is alert. She  has normal reflexes. She displays no tremor. No cranial nerve deficit. She exhibits normal muscle tone. Coordination normal.  Skin: Skin is warm and dry. No rash noted. No erythema. No pallor.  Psychiatric: Thought content normal. Her mood appears anxious. Her affect is not blunt and not labile. Her speech is not rapid and/or pressured and not delayed. She is not agitated, not aggressive, not slowed and not withdrawn. Thought content is not paranoid. Cognition and memory are normal. She exhibits a depressed mood. She expresses no homicidal and no suicidal ideation.          Assessment & Plan:

## 2012-10-29 DIAGNOSIS — R892 Abnormal level of other drugs, medicaments and biological substances in specimens from other organs, systems and tissues: Secondary | ICD-10-CM | POA: Insufficient documentation

## 2012-10-29 NOTE — Assessment & Plan Note (Addendum)
Lab Results  Component Value Date   HGBA1C 10.7* 10/20/2012   Out of control with lantus and metformin Will inc lantus by 10 u per week- by 20 units total and re eval emph imp of better self care and lifestyle/wt loss  F/u planned

## 2012-10-29 NOTE — Assessment & Plan Note (Signed)
On tox screen -pt's alcohol level was pos yet she denies any alcohol intake in general except for 2 d before  ? Source - if she could be drinking without admitting it -and this could affect blood sugar Will continue to monitor this Pt has never seemed intoxicated at any visi

## 2012-10-29 NOTE — Assessment & Plan Note (Signed)
bp in fair control at this time  No changes needed  Disc lifstyle change with low sodium diet and exercise   

## 2012-10-29 NOTE — Assessment & Plan Note (Signed)
anx and depression are worse Will inc zoloft to 100mg  Enc counseling Long disc about coping skills and stressors

## 2012-11-03 ENCOUNTER — Telehealth: Payer: Self-pay

## 2012-11-03 MED ORDER — SERTRALINE HCL 100 MG PO TABS: 100.0000 mg | ORAL_TABLET | Freq: Every day | ORAL | Status: DC

## 2012-11-03 NOTE — Telephone Encounter (Signed)
I will change px to the 100 mg pill so she does not have to take as many pills [facility]

## 2012-11-03 NOTE — Telephone Encounter (Signed)
When pt seen 10/27/12 pt has been taking Sertraline 50 mg taking 2 tabs at bedtime for awhile; pt thought Dr Milinda Antis wanted to increase dosage when finished present prescription pt has been taking. Pt has finished her last Sertraline and wants clarification of how to take med and is new prescription going to be called in to [facility]. Pt wants to verify when Dr Milinda Antis wants to recheck pt. Pt presently has appt 12/11/12.Please advise. Pt request call back today.

## 2012-11-04 ENCOUNTER — Encounter: Payer: Self-pay | Admitting: Family Medicine

## 2012-11-04 NOTE — Telephone Encounter (Signed)
Pt.notified

## 2012-11-29 ENCOUNTER — Other Ambulatory Visit (HOSPITAL_COMMUNITY): Payer: Self-pay | Admitting: Adult Health

## 2012-12-04 ENCOUNTER — Encounter: Payer: Self-pay | Admitting: Internal Medicine

## 2012-12-04 ENCOUNTER — Ambulatory Visit (INDEPENDENT_AMBULATORY_CARE_PROVIDER_SITE_OTHER): Payer: BC Managed Care – PPO | Admitting: Internal Medicine

## 2012-12-04 VITALS — BP 114/68 | HR 69 | Temp 98.0°F | Resp 12 | Wt 280.0 lb

## 2012-12-04 DIAGNOSIS — C73 Malignant neoplasm of thyroid gland: Secondary | ICD-10-CM

## 2012-12-04 DIAGNOSIS — E890 Postprocedural hypothyroidism: Secondary | ICD-10-CM

## 2012-12-04 DIAGNOSIS — E119 Type 2 diabetes mellitus without complications: Secondary | ICD-10-CM

## 2012-12-04 MED ORDER — METFORMIN HCL 1000 MG PO TABS: 1000.0000 mg | ORAL_TABLET | Freq: Two times a day (BID) | ORAL | Status: DC

## 2012-12-04 NOTE — Patient Instructions (Addendum)
Stay on the current Levemir dose, but add Metformin 500 mg 2x a day and increase to 1000 mg 2x a day after few days. Send me a message in 2 weeks about your sugars. Please return in 1 month with your sugar log.  Take iron tablets before lunch and at night.  PATIENT INSTRUCTIONS FOR TYPE 2 DIABETES:  DIET AND EXERCISE Diet and exercise is an important part of diabetic treatment.  We recommended aerobic exercise in the form of brisk walking (working between 40-60% of maximal aerobic capacity, similar to brisk walking) for 150 minutes per week (such as 30 minutes five days per week) along with 3 times per week performing 'resistance' training (using various gauge rubber tubes with handles) 5-10 exercises involving the major muscle groups (upper body, lower body and core) performing 10-15 repetitions (or near fatigue) each exercise. Start at half the above goal but build slowly to reach the above goals. If limited by weight, joint pain, or disability, we recommend daily walking in a swimming pool with water up to waist to reduce pressure from joints while allow for adequate exercise.    BLOOD GLUCOSES Monitoring your blood glucoses is important for continued management of your diabetes. Please check your blood glucoses 2-4 times a day: fasting, before meals and at bedtime (you can rotate these measurements - e.g. one day check before the 3 meals, the next day check before 2 of the meals and before bedtime, etc.   HYPOGLYCEMIA (low blood sugar) Hypoglycemia is usually a reaction to not eating, exercising, or taking too much insulin/ other diabetes drugs.  Symptoms include tremors, sweating, hunger, confusion, headache, etc. Treat IMMEDIATELY with 15 grams of Carbs:   4 glucose tablets    cup regular juice/soda   2 tablespoons raisins   4 teaspoons sugar   1 tablespoon honey Recheck blood glucose in 15 mins and repeat above if still symptomatic/blood glucose <100. Please contact our office at  [phone] if you have questions about how to next handle your insulin.  RECOMMENDATIONS TO REDUCE YOUR RISK OF DIABETIC COMPLICATIONS: * Take your prescribed MEDICATION(S). * Follow a DIABETIC diet: Complex carbs, fiber rich foods, heart healthy fish twice weekly, (monounsaturated and polyunsaturated) fats * AVOID saturated/trans fats, high fat foods, >2,300 mg salt per day. * EXERCISE at least 5 times a week for 30 minutes or preferably daily.  * DO NOT SMOKE OR DRINK more than 1 drink a day. * Check your FEET every day. Do not wear tightfitting shoes. Contact us if you develop an ulcer * See your EYE doctor once a year or more if needed * Get a FLU shot once a year * Get a PNEUMONIA vaccine once before and once after age 37 years  GOALS:  * Your Hemoglobin A1c of <7%  * fasting sugars need to be <130 * after meals sugars need to be <180 (2h after you start eating) * Your Systolic BP should be 140 or lower  * Your Diastolic BP should be 80 or lower  * Your HDL (Good Cholesterol) should be 40 or higher  * Your LDL (Bad Cholesterol) should be 100 or lower  * Your Triglycerides should be 150 or lower  * Your Urine microalbumin (kidney function) should be <30 * Your Body Mass Index should be 25 or lower   We will be glad to help you achieve these goals. Our telephone number is: [phone].

## 2012-12-04 NOTE — Progress Notes (Signed)
Patient ID: [name], female   DOB: 1965-07-08, 47 y.o.   MRN: [id number]  HPI: [name] is a 47 y.o.-year-old female, referred by her PCP, Dr.Tower, for management of DM2, insulin-dependent, uncontrolled, with complications (peripheral neuropathy, dCHF). I am also following the pt for h/o papillary thyroid cancer/iatrogenic hypothyroidism.  DM2: Patient has been diagnosed with diabetes in ~2008; she started on insulin ~2011. Last hemoglobin A1c was: Lab Results  Component Value Date   HGBA1C 10.7* 10/20/2012    Pt is on a regimen of: - Lantus 60 units qhs She was on Metformin but taken off during a hospitalization last year with CHF and ARF. She could tolerate it well in the past. She was also on Glipizide but developed hypoglycemia >> loss of consciousness. She was also on Amaryl >> taken off.   Pt checks her sugars 1-2 a day and they are: - am: 210-300 - bedtime: 210-300 No lows. Lowest sugar was 100s in last 6 mo; she has hypoglycemia awareness at 180. Highest sugar was 400s.  Pt's meals are: - Breakfast: skips - 10 o'clock: banana, apple, nectarine - Lunch: fast food - Dinner: goes out to eat around 8 o'clock: chicken sandwich or a wrap - Snacks: 0, no concentrated sweets  - no CKD, last BUN/creatinine:  Lab Results  Component Value Date   BUN 13 07/20/2012   CREATININE 0.7 07/20/2012  Last ACR was 3.4 on 10/20/2012. Not on ACEI/ARB.   - last set of lipids: Lab Results  Component Value Date   CHOL 138 01/17/2012   HDL 38* 01/17/2012   LDLCALC 78 01/17/2012   LDLDIRECT 100.0 02/04/2011   TRIG 108 01/17/2012   CHOLHDL 3.6 01/17/2012  She is on Simvastatin.  - last eye exam was in 08/2012. No DR. Has glaucoma - on drops. - + numbness and tingling in her feet.  Pt has FH of DM in uncles, aunts, cousin.  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 It was incidentally found  that she had a subcentimeter focus of papillary thyroid cancer, follicular variant. No RAITx.  At last visit, she was telling me that she was taking the Synthroid in am along with her PPI >> advised to separate them by >4h. She now takes Protonix at night. However, she tells me that she now takes the Synthroid at 8-8:30 am but takes iron at 10 am! No calcium or MVI.  Reviewed TSH levels: Lab Results  Component Value Date   TSH 1.86 09/04/2012   TSH 9.63* 07/27/2012   TSH 9.74 Repeated and verified X2.* 06/26/2012   TSH 24.58* 05/22/2012   TSH 1.528 01/18/2012   TSH 2.90 04/27/2010   TSH 2.86 04/20/2008   TSH 2.58 06/17/2006   ROS: Constitutional: + weight gain, + fatigue, + subjective hyperthermia Eyes: no blurry vision, no xerophthalmia ENT: no sore throat, no nodules palpated in throat, no dysphagia/odynophagia, no hoarseness Cardiovascular: no CP/SOB/palpitations/leg swelling Respiratory: no cough/SOB Gastrointestinal: no N/V/+D/no C Musculoskeletal: + muscle/ + joint aches Skin: no rashes; + itching Neurological: no tremors/numbness/tingling/dizziness, + HA  I reviewed pt's medications, allergies, PMH, social hx, family hx and no changes required, except as mentioned above.  PE: BP 114/68  Pulse 69  Temp(Src) 98 F (36.7 C) (Oral)  Resp 12  Wt 280 lb (127.007 kg)  BMI 46.59 kg/m2  SpO2 98% Wt Readings from Last 3 Encounters:  12/04/12 280 lb (127.007 kg)  10/27/12  282 lb (127.914 kg)  10/16/12 281 lb (127.461 kg)   Constitutional: overweight, in NAD Eyes: PERRLA, EOMI, no exophthalmos ENT: moist mucous membranes, no thyromegaly, no cervical lymphadenopathy Cardiovascular: RRR, No MRG Respiratory: CTA B Gastrointestinal: abdomen soft, NT, ND, BS+ Musculoskeletal: no deformities, strength intact in all 4 Skin: moist, warm, no rashes  ASSESSMENT: 1. DM2, non-insulin-dependent, uncontrolled, without complications  2. Iatrogenic Hypothyroidism  PLAN:  1. Patient with a  6 yr h/o DM, recently more uncontrolled diabetes, on basal insulin regimen, which became insufficient - We discussed about options for treatment, and I advised her to:  Stay on the current Levemir dose, but add Metformin 500 mg 2x a day and increase to 1000 mg 2x a day after few days. Send me a message in 2 weeks about your sugars. - Strongly advised her to start checking her sugars at different times of the day - check 2 times a day, rotating checks - given sugar log and advised how to fill it and to bring it at next appt  - given foot care handout and explained the principles  - given instructions for hypoglycemia management "15-15 rule"  - advised for yearly eye exams - Return to clinic in one month with sugar log - we will check HbA1C then   2. Iatrogenic Hypothyr - advised to separate iron more from Synthroid (take iron at lunch) - will recheck TFTs along with the HbA1C at next visit

## 2012-12-11 ENCOUNTER — Ambulatory Visit: Payer: BC Managed Care – PPO | Admitting: Family Medicine

## 2012-12-24 ENCOUNTER — Encounter: Payer: Self-pay | Admitting: Internal Medicine

## 2012-12-24 ENCOUNTER — Encounter: Payer: Self-pay | Admitting: Family Medicine

## 2012-12-25 ENCOUNTER — Telehealth: Payer: Self-pay | Admitting: Family Medicine

## 2012-12-25 NOTE — Telephone Encounter (Signed)
Opened in error

## 2013-01-08 ENCOUNTER — Ambulatory Visit: Payer: BC Managed Care – PPO | Admitting: Internal Medicine

## 2013-01-15 ENCOUNTER — Other Ambulatory Visit: Payer: Self-pay | Admitting: Endocrinology

## 2013-01-18 ENCOUNTER — Other Ambulatory Visit: Payer: Self-pay | Admitting: *Deleted

## 2013-01-18 MED ORDER — LEVOTHYROXINE SODIUM 175 MCG PO TABS: 175.0000 ug | ORAL_TABLET | Freq: Every day | ORAL | Status: DC

## 2013-02-03 ENCOUNTER — Ambulatory Visit (HOSPITAL_COMMUNITY)
Admission: RE | Admit: 2013-02-03 | Discharge: 2013-02-03 | Disposition: A | Discharge status: Home / Self Care | Payer: BC Managed Care – PPO | Source: Ambulatory Visit | Attending: Internal Medicine | Admitting: Internal Medicine

## 2013-02-03 VITALS — BP 142/84 | HR 78 | Wt 285.0 lb

## 2013-02-03 DIAGNOSIS — I129 Hypertensive chronic kidney disease with stage 1 through stage 4 chronic kidney disease, or unspecified chronic kidney disease: Secondary | ICD-10-CM | POA: Insufficient documentation

## 2013-02-03 DIAGNOSIS — N189 Chronic kidney disease, unspecified: Secondary | ICD-10-CM | POA: Insufficient documentation

## 2013-02-03 DIAGNOSIS — F411 Generalized anxiety disorder: Secondary | ICD-10-CM | POA: Insufficient documentation

## 2013-02-03 DIAGNOSIS — K219 Gastro-esophageal reflux disease without esophagitis: Secondary | ICD-10-CM | POA: Insufficient documentation

## 2013-02-03 DIAGNOSIS — R002 Palpitations: Secondary | ICD-10-CM

## 2013-02-03 DIAGNOSIS — E119 Type 2 diabetes mellitus without complications: Secondary | ICD-10-CM | POA: Insufficient documentation

## 2013-02-03 DIAGNOSIS — F3289 Other specified depressive episodes: Secondary | ICD-10-CM | POA: Insufficient documentation

## 2013-02-03 DIAGNOSIS — F329 Major depressive disorder, single episode, unspecified: Secondary | ICD-10-CM | POA: Insufficient documentation

## 2013-02-03 DIAGNOSIS — I5032 Chronic diastolic (congestive) heart failure: Secondary | ICD-10-CM

## 2013-02-03 DIAGNOSIS — I509 Heart failure, unspecified: Secondary | ICD-10-CM | POA: Insufficient documentation

## 2013-02-03 DIAGNOSIS — G4733 Obstructive sleep apnea (adult) (pediatric): Secondary | ICD-10-CM | POA: Insufficient documentation

## 2013-02-03 DIAGNOSIS — E0590 Thyrotoxicosis, unspecified without thyrotoxic crisis or storm: Secondary | ICD-10-CM | POA: Insufficient documentation

## 2013-02-03 DIAGNOSIS — I5030 Unspecified diastolic (congestive) heart failure: Secondary | ICD-10-CM | POA: Insufficient documentation

## 2013-02-03 DIAGNOSIS — E785 Hyperlipidemia, unspecified: Secondary | ICD-10-CM | POA: Insufficient documentation

## 2013-02-03 LAB — BASIC METABOLIC PANEL
Calcium: 9.2 mg/dL (ref 8.4–10.5)
Creatinine, Ser: 0.64 mg/dL (ref 0.50–1.10)
GFR calc non Af Amer: >90 mL/min (ref >90)
Glucose, Bld: 215 mg/dL — ABNORMAL HIGH (ref 70–99)
Sodium: 137 mEq/L (ref 135–145)

## 2013-02-03 MED ORDER — TORSEMIDE 20 MG PO TABS: 20.0000 mg | ORAL_TABLET | ORAL | Status: DC

## 2013-02-03 MED ORDER — POTASSIUM CHLORIDE ER 20 MEQ PO TBCR: 20.0000 meq | EXTENDED_RELEASE_TABLET | ORAL | Status: DC

## 2013-02-03 NOTE — Patient Instructions (Addendum)
Follow up in 6 months  Take Torsemide 20 mg twice a week with 20 meq of potassium   Do the following things EVERYDAY: 1) Weigh yourself in the morning before breakfast. Write it down and keep it in a log. 2) Take your medicines as prescribed 3) Eat low salt foods-Limit salt (sodium) to 2000 mg per day.  4) Stay as active as you can everyday 5) Limit all fluids for the day to less than 2 liters

## 2013-02-03 NOTE — Progress Notes (Signed)
Patient ID: [name], female   DOB: 28-Feb-1966, 47 y.o.   MRN: [id number]   Weight Range  < 280  Baseline proBNP   PCP: Dr Lucretia Roers  Pulmonary: Dr. [name] ENT: Dr Lendon Collar Endocrinologist: Dr Lafe Garin  HPI: [name] is a 47 y/o F hx of DM, HTN, HL, morbid obesity, OSA noncompliant with CPAP due to finances, anxiety whose cardiac hx includes absence of coronary artery disease on cath in 2006 for CP with only catheter-induced spasm of RCA. EF was 65% at that time. Developed acute renal failure after CT contrast.   Nuclear Stress 02/17/12: Normal, EF 58%  Echo 10/24: EF 50-55%. Grade 2 diastolic dysfunction. RV mildly dilated. PAPP 68 mmHg  08/18/12 ECHO EF 60% Grade 2 diastolic dysfunction. RV looks good. No significant TR to measure PA pressure.   S/P Thyroidectomy January 2014.   She returns for follow up today.  Ongoing problems getting synthroid regulated. Denies SOB/PND/Orthopnea. Complains of chest discomfort and tightness when she has lower extremity edema but goes away after she takes torsemide.  Complains of palpitations 2-3 times a week. She takes torsemide about once a week. Weight at home 283-284 pounds. Continues to use CPAP. Continues to work full time at [employer] and also working 2 other jobs. Not exercising.   ROS: All systems negative except as listed in HPI, PMH and Problem List.  Past Medical History  Diagnosis Date  . Anemia     iron deficiency  . Asthma     a. 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  . Depression     a. Stress reaction 08/2011 in multiple social stressors.  . Diabetes mellitus   . Hypertension   . Morbid obesity   . Hyperlipidemia   . Vertigo   . PCOS (polycystic ovarian syndrome)   . Gastritis   . Insomnia   . GERD (gastroesophageal reflux disease)   . OSA (obstructive sleep apnea) 01/17/2012  . CHF (congestive heart failure)   . Dysrhythmia   . Hyperthyroidism   . Anxiety   .  Chronic kidney disease     /13  . Hemorrhoid   . Arthritis   . Complication of anesthesia     DIFFICULT WAKING- 2011  . Papillary thyroid carcinoma     Current Outpatient Prescriptions  Medication Sig Dispense Refill  . albuterol (PROAIR HFA) 108 (90 BASE) MCG/ACT inhaler Inhale 2 puffs into the lungs every 4 (four) hours as needed. For wheezing  1 Inhaler  4  . buPROPion (WELLBUTRIN XL) 150 MG 24 hr tablet Take 1 tablet (150 mg total) by mouth daily.  30 tablet  11  . carvedilol (COREG) 3.125 MG tablet Take 1 tablet (3.125 mg total) by mouth 2 (two) times daily with a meal.  60 tablet  11  . cetirizine (ZYRTEC) 10 MG tablet Take 10 mg by mouth daily.      . dorzolamide (TRUSOPT) 2 % ophthalmic solution 1 drop 3 (three) times daily.      . ferrous sulfate 325 (65 FE) MG tablet Take 325 mg by mouth 2 (two) times daily.        . insulin glargine (LANTUS) 100 UNIT/ML injection Inject 60 Units into the skin at bedtime.       . Insulin Pen Needle (B-D ULTRAFINE III SHORT PEN) 31G X 8 MM MISC USE 1 DAILY FOR DM 250.0  100 each  3  . levothyroxine (SYNTHROID, LEVOTHROID) 175 MCG  tablet Take 1 tablet (175 mcg total) by mouth daily before breakfast.  30 tablet  5  . metFORMIN (GLUCOPHAGE) 1000 MG tablet Take 1 tablet (1,000 mg total) by mouth 2 (two) times daily with a meal.  180 tablet  3  . ONE TOUCH ULTRA TEST test strip USE AS DIRECTED TO CHECK BLOOD SUGAR TWICE DAILY  100 each  1  . pantoprazole (PROTONIX) 40 MG tablet Take 1 tablet (40 mg total) by mouth daily.  30 tablet  11  . sertraline (ZOLOFT) 100 MG tablet Take 1 tablet (100 mg total) by mouth daily.  30 tablet  11  . simvastatin (ZOCOR) 20 MG tablet Take 1 tablet (20 mg total) by mouth every evening.  30 tablet  11  . spironolactone (ALDACTONE) 25 MG tablet TAKE 1 TABLET BY MOUTH EVERY DAY  30 tablet  6  . TRAVATAN Z 0.004 % SOLN ophthalmic solution       . zolpidem (AMBIEN) 10 MG tablet TAKE 1/2 TO 1 TABLET BY MOUTH AT BEDTIME AS  NEEDED FOR SLEEP  30 tablet  5  . fluticasone (FLONASE) 50 MCG/ACT nasal spray Place 2 sprays into the nose daily as needed. For allergies      . torsemide (DEMADEX) 20 MG tablet Daily for weight 275 or greater.  30 tablet  5   No current facility-administered medications for this encounter.     PHYSICAL EXAM: Filed Vitals:   02/03/13 1108  BP: 142/84  Pulse: 78  Weight: 285 lb (129.275 kg)  SpO2: 98%    General:  Obese. No resp difficulty HEENT: normal Neck: supple. JVP unable to assess due to body habitus but mildly elevated. Carotids 2+ bilaterally; no bruits. No lymphadenopathy or thryomegaly appreciated. Scar Cor: PMI normal. Regular rate & rhythm. No rubs, gallops or murmurs. Lungs: clear Abdomen: obese, soft, nontender, + distended. No hepatosplenomegaly. No bruits or masses. Good bowel sounds. Extremities: no cyanosis, clubbing, rash, edema Neuro: alert & orientedx3, cranial nerves grossly intact. Moves all 4 extremities w/o difficulty. Affect pleasant.  ASSESSMENT & PLAN: 1. Chronic diastolic heart failure NYHA III Volume status ok but difficult to assess due to body habitus. Weight up 10 pounds. Will change torsemide to 20 mg twice a week with 20 meq of potassium.  Would like weight to be < 280. I discussed with HF pharmacist and there is no interaction between synthroid and torsemide. Ok to take at the same time.  Check BMET today.  2. Palpitations- Place 2 week event monitor 3.  OSA- Continue CPAP nightly 4. S/P thyroidectomy  03/2012. Followed closely by Dr Lafe Garin 5. DM II. Per Dr Lafe Garin 6. Obesity- encouraged to exercise and portion control.   Follow up in 6 months  CLEGG,AMYNP-C 11:26 AM

## 2013-02-05 ENCOUNTER — Encounter (INDEPENDENT_AMBULATORY_CARE_PROVIDER_SITE_OTHER): Payer: BC Managed Care – PPO

## 2013-02-05 ENCOUNTER — Encounter: Payer: Self-pay | Admitting: Radiology

## 2013-02-05 DIAGNOSIS — R002 Palpitations: Secondary | ICD-10-CM

## 2013-02-05 NOTE — Progress Notes (Signed)
Patient ID: [name], female   DOB: 03-Oct-1965, 47 y.o.   MRN: [id number] E cardio 2 week monitor applied

## 2013-03-24 ENCOUNTER — Telehealth (HOSPITAL_COMMUNITY): Payer: Self-pay | Admitting: *Deleted

## 2013-03-24 NOTE — Telephone Encounter (Signed)
Called pt with monitor results, NSR no arrhthymias per Dr Gala Romney

## 2013-04-25 ENCOUNTER — Other Ambulatory Visit: Payer: Self-pay | Admitting: Family Medicine

## 2013-04-26 NOTE — Telephone Encounter (Signed)
Electronic refill request, please advise  

## 2013-04-26 NOTE — Telephone Encounter (Signed)
Px written for call in   

## 2013-05-10 ENCOUNTER — Other Ambulatory Visit: Payer: Self-pay | Admitting: Family Medicine

## 2013-05-10 NOTE — Telephone Encounter (Signed)
?   Just done 2 weeks ago?

## 2013-05-10 NOTE — Telephone Encounter (Signed)
Electronic refill request, please advise  

## 2013-05-12 NOTE — Telephone Encounter (Signed)
This looks like it was just refilled -? Did they not get it?

## 2013-05-12 NOTE — Telephone Encounter (Signed)
Electronic refill request, please advise  

## 2013-05-12 NOTE — Telephone Encounter (Signed)
Doesn't look like Rx was called in on 04/26/13 so Rx called in and advise pharmacy to see if Rx was sent on 04/26/13 if not we can fill Rx now

## 2013-05-18 ENCOUNTER — Other Ambulatory Visit: Payer: Self-pay | Admitting: Family Medicine

## 2013-05-19 NOTE — Telephone Encounter (Signed)
done

## 2013-05-19 NOTE — Telephone Encounter (Signed)
Please refill for 6 mo 

## 2013-05-22 ENCOUNTER — Other Ambulatory Visit: Payer: Self-pay | Admitting: Family Medicine

## 2013-05-24 NOTE — Telephone Encounter (Signed)
Rx already done

## 2013-05-25 ENCOUNTER — Other Ambulatory Visit: Payer: Self-pay | Admitting: Family Medicine

## 2013-07-01 ENCOUNTER — Other Ambulatory Visit: Payer: Self-pay

## 2013-07-28 ENCOUNTER — Encounter (HOSPITAL_COMMUNITY): Payer: Self-pay

## 2013-09-06 ENCOUNTER — Encounter: Payer: Self-pay | Admitting: Internal Medicine

## 2013-10-01 ENCOUNTER — Other Ambulatory Visit: Payer: Self-pay | Admitting: *Deleted

## 2013-10-01 ENCOUNTER — Ambulatory Visit (HOSPITAL_COMMUNITY)
Admission: RE | Admit: 2013-10-01 | Discharge: 2013-10-01 | Disposition: A | Discharge status: Home / Self Care | Payer: BC Managed Care – PPO | Source: Ambulatory Visit | Attending: Internal Medicine | Admitting: Internal Medicine

## 2013-10-01 ENCOUNTER — Other Ambulatory Visit (INDEPENDENT_AMBULATORY_CARE_PROVIDER_SITE_OTHER): Payer: BC Managed Care – PPO

## 2013-10-01 VITALS — BP 158/86 | HR 83 | Wt 288.5 lb

## 2013-10-01 DIAGNOSIS — G4733 Obstructive sleep apnea (adult) (pediatric): Secondary | ICD-10-CM

## 2013-10-01 DIAGNOSIS — E119 Type 2 diabetes mellitus without complications: Secondary | ICD-10-CM

## 2013-10-01 DIAGNOSIS — R079 Chest pain, unspecified: Secondary | ICD-10-CM

## 2013-10-01 DIAGNOSIS — I509 Heart failure, unspecified: Secondary | ICD-10-CM | POA: Insufficient documentation

## 2013-10-01 DIAGNOSIS — I5032 Chronic diastolic (congestive) heart failure: Secondary | ICD-10-CM

## 2013-10-01 DIAGNOSIS — F411 Generalized anxiety disorder: Secondary | ICD-10-CM | POA: Insufficient documentation

## 2013-10-01 DIAGNOSIS — E669 Obesity, unspecified: Secondary | ICD-10-CM | POA: Insufficient documentation

## 2013-10-01 DIAGNOSIS — N189 Chronic kidney disease, unspecified: Secondary | ICD-10-CM | POA: Insufficient documentation

## 2013-10-01 DIAGNOSIS — Z794 Long term (current) use of insulin: Secondary | ICD-10-CM | POA: Insufficient documentation

## 2013-10-01 DIAGNOSIS — I129 Hypertensive chronic kidney disease with stage 1 through stage 4 chronic kidney disease, or unspecified chronic kidney disease: Secondary | ICD-10-CM | POA: Insufficient documentation

## 2013-10-01 DIAGNOSIS — E0590 Thyrotoxicosis, unspecified without thyrotoxic crisis or storm: Secondary | ICD-10-CM | POA: Insufficient documentation

## 2013-10-01 LAB — HEMOGLOBIN A1C: HEMOGLOBIN A1C: 10.4 % — AB (ref 4.6–6.5)

## 2013-10-01 NOTE — Patient Instructions (Signed)
Your physician has requested that you have an echocardiogram. Echocardiography is a painless test that uses sound waves to create images of your heart. It provides your doctor with information about the size and shape of your heart and how well your heart's chambers and valves are working. This procedure takes approximately one hour. There are no restrictions for this procedure.  Your physician has requested that you have a lexiscan myoview. For further information please visit [email or web address]. Please follow instruction sheet, as given.  We will contact you in 6 months to schedule your next appointment.

## 2013-10-01 NOTE — Progress Notes (Signed)
Patient ID: [name], female   DOB: 09-12-1965, 48 y.o.   MRN: [id number]   Weight Range  < 280  Baseline proBNP   PCP: Dr [name]  Pulmonary: Dr. Lamonte Hodges ENT: Dr [name] Endocrinologist: Dr [name]  HPI: [name] is a 48 y/o F hx of DM, HTN, HL, morbid obesity, OSA noncompliant with CPAP due to finances, anxiety, diastolic HF with secondary pulmonary HTN and noncardiac CP. Cath 2006 for CP with only catheter-induced spasm of RCA otherwise normal coronaries. EF was 65% at that time. Developed acute renal failure after CT contrast.   Nuclear Stress 02/17/12: Normal, EF 58%  Echo 10/24: EF 50-55%. Grade 2 diastolic dysfunction. RV mildly dilated. PAPP 68 mmHg  08/18/12 ECHO EF 27% Grade 2 diastolic dysfunction. RV looks good. No significant TR to measure PA pressure.   S/P Thyroidectomy January 2014.   She returns for follow up today.  Says she is doing ok. Scale has broken.So not weighing. Takes torsemide 2x/week and extra as needed. Under a lot of stress. Just put mother in SNF for multiple sclerosis. Continues to work full time at [employer] in [place]. Has been having some chest heaviness when under stress + prominent heart beat. Feels like it is anxiety. Taking Welbutrin and Zoloft.  Gets SOB and chest tightness when climbing 20 steps at work. Continues to use CPAP.Not exercising.  Weight up 3-4 pounds since last visit. Has gained 30 pounds over past 18 months.  ROS: All systems negative except as listed in HPI, PMH and Problem List.  Past Medical History  Diagnosis Date  . Anemia     iron deficiency  . Asthma     a. 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  . Depression     a. Stress reaction 08/2011 in multiple social stressors.  . Diabetes mellitus   . Hypertension   . Morbid obesity   . Hyperlipidemia   . Vertigo   . PCOS (polycystic ovarian syndrome)   . Gastritis   . Insomnia   . GERD (gastroesophageal  reflux disease)   . OSA (obstructive sleep apnea) 01/17/2012  . CHF (congestive heart failure)   . Dysrhythmia   . Hyperthyroidism   . Anxiety   . Chronic kidney disease     /13  . Hemorrhoid   . Arthritis   . Complication of anesthesia     DIFFICULT WAKING- 2011  . Papillary thyroid carcinoma     Current Outpatient Prescriptions  Medication Sig Dispense Refill  . albuterol (PROAIR HFA) 108 (90 BASE) MCG/ACT inhaler Inhale 2 puffs into the lungs every 4 (four) hours as needed. For wheezing  1 Inhaler  4  . buPROPion (WELLBUTRIN XL) 150 MG 24 hr tablet TAKE 1 TABLET BY MOUTH DAILY  30 tablet  5  . carvedilol (COREG) 3.125 MG tablet TAKE 1 TABLET BY MOUTH TWICE A DAY WITH A MEAL  60 tablet  5  . cetirizine (ZYRTEC) 10 MG tablet Take 10 mg by mouth daily.      . dorzolamide (TRUSOPT) 2 % ophthalmic solution 1 drop 3 (three) times daily.      . ferrous sulfate 325 (65 FE) MG tablet Take 325 mg by mouth 2 (two) times daily.        . fluticasone (FLONASE) 50 MCG/ACT nasal spray Place 2 sprays into the nose daily as needed. For allergies      . Insulin Pen Needle (B-D ULTRAFINE  III SHORT PEN) 31G X 8 MM MISC USE 1 DAILY FOR DM 250.0  100 each  3  . LANTUS SOLOSTAR 100 UNIT/ML Solostar Pen INJECT 30 UNITS INTO THE SKIN AT BEDTIME.  15 mL  5  . levothyroxine (SYNTHROID, LEVOTHROID) 175 MCG tablet Take 1 tablet (175 mcg total) by mouth daily before breakfast.  30 tablet  5  . metFORMIN (GLUCOPHAGE) 1000 MG tablet Take 1 tablet (1,000 mg total) by mouth 2 (two) times daily with a meal.  180 tablet  3  . ONE TOUCH ULTRA TEST test strip USE AS DIRECTED TO CHECK BLOOD SUGAR TWICE DAILY  100 each  1  . pantoprazole (PROTONIX) 40 MG tablet TAKE 1 TABLET BY MOUTH DAILY  30 tablet  5  . potassium chloride 20 MEQ TBCR Take 20 mEq by mouth 2 (two) times a week. Take with torsemide  30 tablet  6  . sertraline (ZOLOFT) 100 MG tablet Take 1 tablet (100 mg total) by mouth daily.  30 tablet  11  . simvastatin  (ZOCOR) 20 MG tablet TAKE 1 TABLET BY MOUTH EVERY EVENING  30 tablet  5  . spironolactone (ALDACTONE) 25 MG tablet TAKE 1 TABLET BY MOUTH EVERY DAY  30 tablet  6  . torsemide (DEMADEX) 20 MG tablet Take 1 tablet (20 mg total) by mouth 2 (two) times a week.  30 tablet  5  . TRAVATAN Z 0.004 % SOLN ophthalmic solution       . zolpidem (AMBIEN) 10 MG tablet TAKE 1/2 TO 1 TABLET BY MOUTH AT BEDTIME AS NEEDED FOR SLEEP  30 tablet  4   No current facility-administered medications for this encounter.     PHYSICAL EXAM: Filed Vitals:   10/01/13 0846  BP: 158/86  Pulse: 83  Weight: 288 lb 8 oz (130.863 kg)  SpO2: 94%    General:  Obese. No resp difficulty HEENT: normal Neck: supple. JVP unable to assess due to body habituCarotids 2+ bilaterally; no bruits. No lymphadenopathy or thryomegaly appreciated. Scar Cor: PMI normal. Regular rate & rhythm. No rubs, gallops or murmurs. Lungs: clear Abdomen: obese, soft, nontender. No hepatosplenomegaly. No bruits or masses. Good bowel sounds. Extremities: no cyanosis, clubbing, rash, edema Neuro: alert & orientedx3, cranial nerves grossly intact. Moves all 4 extremities w/o difficulty. Affect pleasant.  ASSESSMENT & PLAN: 1. Chronic diastolic heart failure Volume status look ok but difficult to assess due to body habitus. Reinforced need for daily weights and reviewed use of sliding scale diuretics. 2. Chest pain  This is recurrent. Has had normal cath in 2006 and normal Myoview in 2013. However, CP has been progressive and comes with stress and exertion. Will check Myoview and echo to assess. She did have elevated pulmonary pressures on previous echo.  3. OSA  Continue CPAP 6. Obesity This is really her major health problem and getting worse. She has gained 30 pounds in 18 months. Stressed need for weight loss. Suggested low-carb diet (Davis or Atkins)  Follow up in 6 months or sooner based on results of testing.   Monica Glasscock BensimhonMD 8:47  AM

## 2013-10-08 ENCOUNTER — Encounter: Payer: Self-pay | Admitting: Internal Medicine

## 2013-10-08 ENCOUNTER — Encounter (HOSPITAL_COMMUNITY): Payer: BC Managed Care – PPO

## 2013-10-12 ENCOUNTER — Ambulatory Visit (HOSPITAL_COMMUNITY)
Admission: RE | Admit: 2013-10-12 | Discharge: 2013-10-12 | Disposition: A | Discharge status: Home / Self Care | Payer: BC Managed Care – PPO | Source: Ambulatory Visit | Attending: Family Medicine | Admitting: Family Medicine

## 2013-10-12 DIAGNOSIS — I369 Nonrheumatic tricuspid valve disorder, unspecified: Secondary | ICD-10-CM

## 2013-10-12 DIAGNOSIS — I5032 Chronic diastolic (congestive) heart failure: Secondary | ICD-10-CM

## 2013-10-12 NOTE — Progress Notes (Signed)
*  PRELIMINARY RESULTS* Echocardiogram 2D Echocardiogram has been performed.  Leavy Cella 10/12/2013, 3:03 PM

## 2013-10-13 ENCOUNTER — Telehealth (HOSPITAL_COMMUNITY): Payer: Self-pay

## 2013-10-13 NOTE — Telephone Encounter (Signed)
Encounter complete. 

## 2013-10-14 ENCOUNTER — Encounter: Payer: Self-pay | Admitting: Family Medicine

## 2013-10-15 ENCOUNTER — Ambulatory Visit (HOSPITAL_COMMUNITY)
Admission: RE | Admit: 2013-10-15 | Discharge: 2013-10-15 | Disposition: A | Discharge status: Home / Self Care | Payer: BC Managed Care – PPO | Source: Ambulatory Visit | Attending: Cardiovascular Disease | Admitting: Cardiovascular Disease

## 2013-10-15 DIAGNOSIS — R079 Chest pain, unspecified: Secondary | ICD-10-CM | POA: Insufficient documentation

## 2013-10-15 MED ORDER — TECHNETIUM TC 99M SESTAMIBI GENERIC - CARDIOLITE
31.8000 | Freq: Once | INTRAVENOUS | Status: AC | PRN
  Administered 2013-10-15: 31.8 via INTRAVENOUS

## 2013-10-15 MED ORDER — AMINOPHYLLINE 25 MG/ML IV SOLN
125.0000 mg | Freq: Once | INTRAVENOUS | Status: AC
  Administered 2013-10-15: 125 mg via INTRAVENOUS

## 2013-10-15 MED ORDER — TECHNETIUM TC 99M SESTAMIBI GENERIC - CARDIOLITE
10.2000 | Freq: Once | INTRAVENOUS | Status: AC | PRN
  Administered 2013-10-15: 10 via INTRAVENOUS

## 2013-10-15 MED ORDER — REGADENOSON 0.4 MG/5ML IV SOLN
0.4000 mg | Freq: Once | INTRAVENOUS | Status: AC
  Administered 2013-10-15: 0.4 mg via INTRAVENOUS

## 2013-10-15 NOTE — Procedures (Addendum)
[address] [place] [address] [phone]  Cardiology Nuclear Med Study  [name] is a 48 y.o. female     MRN : [id number]     DOB: 12/17/1965  Procedure Date: 10/15/2013  Nuclear Med Background Indication for Stress Test:  Evaluation for Ischemia History:  Asthma and CHF;Dysrhythmia;Last NUC MPI at [facility] 2 years ago;No further information available. Cardiac Risk Factors: History of Smoking, Hypertension, IDDM Type 2, Lipids and Obesity  Symptoms:  Chest Pain, Dizziness, DOE, Fatigue, Light-Headedness and Palpitations   Nuclear Pre-Procedure Caffeine/Decaff Intake:  1:00am NPO After: 11am   IV Site: R Forearm  IV 0.9% NS with Angio Cath:  22g  Chest Size (in):  n/a IV Started by: Rolene Course, RN  Height:    Cup Size: DD  BMI:  There is no weight on file to calculate BMI. Weight:      Tech Comments:  n/a    Nuclear Med Study 1 or 2 day study: 1 day  Stress Test Type:  [place] Provider:  Glori Bickers, MD   Resting Radionuclide: Technetium 65m Sestamibi  Resting Radionuclide Dose: 10.2 mCi   Stress Radionuclide:  Technetium 73m Sestamibi  Stress Radionuclide Dose: 31.8 mCi           Stress Protocol Rest HR: 66 Stress HR: 86  Rest BP: 167/98 Stress BP: 190/96  Exercise Time (min): n/a METS: n/a          Dose of Adenosine (mg):  n/a Dose of Lexiscan: 0.4 mg  Dose of Atropine (mg): n/a Dose of Dobutamine: n/a mcg/kg/min (at max HR)  Stress Test Technologist: Mellody Memos, CCT Nuclear Technologist: Imagene Riches, CNMT   Rest Procedure:  Myocardial perfusion imaging was performed at rest 45 minutes following the intravenous administration of Technetium 22m Sestamibi. Stress Procedure:  The patient received IV Lexiscan 0.4 mg over 15-seconds.  Technetium 40m Sestamibi injected IV at 30-seconds.  Patient experienced shortness of breath, head pain and was  administered 125 mg of Aminophylline IV at 5 minutes. There were no significant changes with Lexiscan.  Quantitative spect images were obtained after a 45 minute delay.  Transient Ischemic Dilatation (Normal <1.22):  1.3  QGS EDV:  110 ml QGS ESV:  55 ml LV Ejection Fraction: 51%      Rest ECG: NSR- incomplete RBBB  Stress ECG: No significant change from baseline ECG  QPS Raw Data Images:  There is considerable soft tissue attenuation artifact due to body habitus; image quality is mediocre. The gated images are particularly difficult to evaluate. Stress Images:  Probably normal homogeneous uptake in all areas of the myocardium. Rest Images:  Probably normal homogeneous uptake in all areas of the myocardium. Subtraction (SDS):  No evidence of ischemia. LV Wall Motion:  Visually normal overall left ventricular systolic function with estimated EF 55-60%. Calculated EF by QGS is 51%  Impression Exercise Capacity:  Lexiscan with no exercise. BP Response:  Hypertensive blood pressure response. Clinical Symptoms:  No significant symptoms noted. ECG Impression:  No significant ECG changes with Lexiscan. Comparison with Prior Nuclear Study: No significant change from previous study   Overall Impression:  Probably normal stress nuclear study. Study accuracy may be limited by technical difficulties.   Monica Klein, MD  10/15/2013 5:06 PM

## 2013-10-19 ENCOUNTER — Encounter: Payer: Self-pay | Admitting: Family Medicine

## 2013-10-19 ENCOUNTER — Ambulatory Visit (INDEPENDENT_AMBULATORY_CARE_PROVIDER_SITE_OTHER): Payer: BC Managed Care – PPO | Admitting: Family Medicine

## 2013-10-19 VITALS — BP 140/86 | HR 70 | Temp 98.8°F | Ht 65.0 in | Wt 284.2 lb

## 2013-10-19 DIAGNOSIS — B9689 Other specified bacterial agents as the cause of diseases classified elsewhere: Secondary | ICD-10-CM

## 2013-10-19 DIAGNOSIS — J019 Acute sinusitis, unspecified: Secondary | ICD-10-CM

## 2013-10-19 DIAGNOSIS — J209 Acute bronchitis, unspecified: Secondary | ICD-10-CM

## 2013-10-19 DIAGNOSIS — B309 Viral conjunctivitis, unspecified: Secondary | ICD-10-CM

## 2013-10-19 MED ORDER — AMOXICILLIN-POT CLAVULANATE 875-125 MG PO TABS: 1.0000 | ORAL_TABLET | Freq: Two times a day (BID) | ORAL | Status: DC

## 2013-10-19 MED ORDER — PREDNISONE 10 MG PO TABS: ORAL_TABLET | ORAL | Status: DC

## 2013-10-19 MED ORDER — BENZONATATE 200 MG PO CAPS: 200.0000 mg | ORAL_CAPSULE | Freq: Three times a day (TID) | ORAL | Status: DC | PRN

## 2013-10-19 NOTE — Assessment & Plan Note (Signed)
Cover with augmentin  sympt care Also has prednisone (for bronchitis)-will help congestion

## 2013-10-19 NOTE — Patient Instructions (Signed)
Rest and get lots of fluids Take augmentin for infection  Use wet warm compresses for eye - wash hands frequently  Tessalon for cough - do not bite pill- swallow it whole  Prednisone as directed for chest tightness/ wheezing   Update if not starting to improve in a week or if worsening

## 2013-10-19 NOTE — Progress Notes (Signed)
Subjective:    Patient ID: [name], female    DOB: 01-Apr-1965, 48 y.o.   MRN: [id number]  HPI Here with 10 days of uri symptoms  Not getting better  Bronchitis and sinus symptoms   Congestion Sinus pain  R eye has discharge in it  Coughing - prod of yellow-green phlegm  Hurts to cough- ribs are sore   otc -no meds - just tylenol for headaches   No fever but has had chills   Patient Active Problem List   Diagnosis Date Noted  . Palpitations 02/03/2013  . Nonspecific abnormal toxicology 10/29/2012  . Yeast vaginitis 10/16/2012  . Other malaise and fatigue 07/27/2012  . 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 Papillary thyroid carcinoma 04/27/2012  . Chronic diastolic heart failure 74/10/1446  . Acute renal failure 01/25/2012  . Diastolic CHF 18/56/3149  . Acute diastolic heart failure 70/26/3785  . OSA (obstructive sleep apnea) just got cpap at home this past week 01/17/12 01/17/2012  . Hypertensive urgency 01/16/2012  . CHF (congestive heart failure) 01/16/2012  . Anxiety 01/16/2012  . Chronic allergic rhinitis 10/26/2010  . Insomnia 10/26/2010  . OBESITY, MORBID 04/27/2010  . HEMORRHOIDS, INTERNAL, WITH BLEEDING 08/17/2009  . GERD 04/27/2008  . HYPERTENSION 11/03/2007  . HYPERLIPIDEMIA 05/14/2007  . OTHER ORGANIC SLEEP DISORDERS 03/09/2007  . DIABETES MELLITUS, TYPE II 09/11/2006  . POLYCYSTIC OVARIES 09/11/2006  . ANEMIA-IRON DEFICIENCY 09/11/2006  . DEPRESSION 09/11/2006  . EXTERNAL HEMORRHOIDS 09/11/2006  . ASTHMA 09/11/2006   Past Medical History  Diagnosis Date  . Anemia     iron deficiency  . Asthma     a. 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  . Depression     a. Stress reaction 08/2011 in multiple social stressors.  . Diabetes mellitus   . Hypertension   . Morbid obesity   . Hyperlipidemia   . Vertigo   . PCOS (polycystic ovarian syndrome)   . Gastritis   . Insomnia   . GERD (gastroesophageal  reflux disease)   . OSA (obstructive sleep apnea) 01/17/2012  . CHF (congestive heart failure)   . Dysrhythmia   . Hyperthyroidism   . Anxiety   . Chronic kidney disease     /13  . Hemorrhoid   . Arthritis   . Complication of anesthesia     DIFFICULT WAKING- 2011  . Papillary thyroid carcinoma    Past Surgical History  Procedure Laterality Date  . Cholecystectomy    . Hemorrhoid surgery    . Tubal ligation    . Shoulder surgery  06/2009  . Tonsillectomy and adenoidectomy  CHILD  . Intrauterine device insertion  2011  . Thyroidectomy  04/10/2012    Procedure: THYROIDECTOMY;  Surgeon: Jerrell Belfast, MD;  Location: [facility];  Service: ENT;  Laterality: N/A;  Total Thyroidectomy   History  Substance Use Topics  . Smoking status: Former Smoker -- 0.20 packs/day for 5 years    Quit date: 03/25/2004  . Smokeless tobacco: Never Used  . Alcohol Use: No   Family History  Problem Relation Age of Onset  . Cancer Father     colon cancer  . Arthritis Father   . Arthritis Mother   . Asthma Mother   . Hypertension Mother    Allergies  Allergen Reactions  . Tape Rash    Plastic tape  . Buspirone Hcl Nausea Only  . Glipizide     REACTION: sleepy, low sugar  .  Ivp Dye [Iodinated Diagnostic Agents]     Shut kidneys down  . Oxycodone-Acetaminophen Itching   Current Outpatient Prescriptions on File Prior to Visit  Medication Sig Dispense Refill  . albuterol (PROAIR HFA) 108 (90 BASE) MCG/ACT inhaler Inhale 2 puffs into the lungs every 4 (four) hours as needed. For wheezing  1 Inhaler  4  . buPROPion (WELLBUTRIN XL) 150 MG 24 hr tablet TAKE 1 TABLET BY MOUTH DAILY  30 tablet  5  . carvedilol (COREG) 3.125 MG tablet TAKE 1 TABLET BY MOUTH TWICE A DAY WITH A MEAL  60 tablet  5  . cetirizine (ZYRTEC) 10 MG tablet Take 10 mg by mouth daily.      . dorzolamide (TRUSOPT) 2 % ophthalmic solution 1 drop 3 (three) times daily.      . ferrous sulfate 325 (65 FE) MG tablet Take 325 mg by mouth  2 (two) times daily.        . fluticasone (FLONASE) 50 MCG/ACT nasal spray Place 2 sprays into the nose daily as needed. For allergies      . Insulin Pen Needle (B-D ULTRAFINE III SHORT PEN) 31G X 8 MM MISC USE 1 DAILY FOR DM 250.0  100 each  3  . LANTUS SOLOSTAR 100 UNIT/ML Solostar Pen INJECT 30 UNITS INTO THE SKIN AT BEDTIME.  15 mL  5  . levothyroxine (SYNTHROID, LEVOTHROID) 175 MCG tablet Take 1 tablet (175 mcg total) by mouth daily before breakfast.  30 tablet  5  . metFORMIN (GLUCOPHAGE) 1000 MG tablet Take 1 tablet (1,000 mg total) by mouth 2 (two) times daily with a meal.  180 tablet  3  . ONE TOUCH ULTRA TEST test strip USE AS DIRECTED TO CHECK BLOOD SUGAR TWICE DAILY  100 each  1  . pantoprazole (PROTONIX) 40 MG tablet TAKE 1 TABLET BY MOUTH DAILY  30 tablet  5  . potassium chloride 20 MEQ TBCR Take 20 mEq by mouth 2 (two) times a week. Take with torsemide  30 tablet  6  . sertraline (ZOLOFT) 100 MG tablet Take 1 tablet (100 mg total) by mouth daily.  30 tablet  11  . simvastatin (ZOCOR) 20 MG tablet TAKE 1 TABLET BY MOUTH EVERY EVENING  30 tablet  5  . spironolactone (ALDACTONE) 25 MG tablet TAKE 1 TABLET BY MOUTH EVERY DAY  30 tablet  6  . torsemide (DEMADEX) 20 MG tablet Take 20 mg by mouth 2 (two) times a week. Wed and Sat      . TRAVATAN Z 0.004 % SOLN ophthalmic solution       . zolpidem (AMBIEN) 10 MG tablet TAKE 1/2 TO 1 TABLET BY MOUTH AT BEDTIME AS NEEDED FOR SLEEP  30 tablet  4   No current facility-administered medications on file prior to visit.    Review of Systems Review of Systems  Constitutional: Negative for fever, appetite change, and unexpected weight change.  ENT pos for congestion and sinus pain  Eyes: Negative for pain and visual disturbance.  Respiratory: Negative for  shortness of breath.  pos for cough and wheezing  Cardiovascular: Negative for cp or palpitations    Gastrointestinal: Negative for nausea, diarrhea and constipation.  Genitourinary:  Negative for urgency and frequency.  Skin: Negative for pallor or rash   Neurological: Negative for weakness, light-headedness, numbness and headaches.  Hematological: Negative for adenopathy. Does not bruise/bleed easily.  Psychiatric/Behavioral: Negative for dysphoric mood. The patient is not nervous/anxious.  Objective:   Physical Exam  Constitutional: She appears well-developed and well-nourished. No distress.  obese and well appearing    HENT:  Head: Normocephalic and atraumatic.  Right Ear: External ear normal.  Left Ear: External ear normal.  Mouth/Throat: Oropharynx is clear and moist. No oropharyngeal exudate.  Nares are injected and congested  Bilateral maxillary sinus tenderness   Eyes: EOM are normal. Pupils are equal, round, and reactive to light. Right eye exhibits no discharge. Left eye exhibits no discharge. No scleral icterus.  R eye -mild conj injection without swelling or drainage  Neck: Normal range of motion. Neck supple.  Cardiovascular: Normal rate and regular rhythm.   Pulmonary/Chest: Effort normal. No respiratory distress. She has wheezes. She has no rales.  Harsh bs  Few scattered rhonchi Wheeze on forced exp No rales   Musculoskeletal: She exhibits no edema.  Lymphadenopathy:    She has no cervical adenopathy.  Neurological: She is alert.  Skin: Skin is warm and dry. No rash noted.  Psychiatric: She has a normal mood and affect.          Assessment & Plan:   Problem List Items Addressed This Visit     Respiratory   Acute bronchitis - Primary     Infectious -also with sinusitis after uri augmentin  Prednisone  Albuterol prn Tessalon for cough Update if not starting to improve in a week or if worsening      Acute bacterial sinusitis     Cover with augmentin  sympt care Also has prednisone (for bronchitis)-will help congestion     Relevant Medications      amoxicillin-clavulanate (AUGMENTIN) tablet 875-125 mg       benzonatate (TESSALON) capsule      predniSONE (DELTASONE) tablet     Other   Viral conjunctivitis     With uri sympt care  Lubricant eye drops otc Update if not starting to improve in a week or if worsening

## 2013-10-19 NOTE — Assessment & Plan Note (Signed)
With uri sympt care  Lubricant eye drops otc Update if not starting to improve in a week or if worsening

## 2013-10-19 NOTE — Assessment & Plan Note (Signed)
Infectious -also with sinusitis after uri augmentin  Prednisone  Albuterol prn Tessalon for cough Update if not starting to improve in a week or if worsening

## 2013-10-19 NOTE — Progress Notes (Signed)
Pre visit review using our clinic review tool, if applicable. No additional management support is needed unless otherwise documented below in the visit note. 

## 2013-10-21 ENCOUNTER — Telehealth: Payer: Self-pay | Admitting: Family Medicine

## 2013-10-21 NOTE — Telephone Encounter (Signed)
Pt was seen 10/19/13 you dx pt with Acute bronchitis, Acute bacterial sinusitis, and Viral conjunctivitis in right eye. Pt went back to work today but was sent home by her boss because her left eye is a little red. Pt said that her boss wanted to know why you didn't prescribe her an Rx for the conjunctivitis, and now that it's in her left eye pt's employer will not let her come back to work with out a doctors note saying that she isn't contagious and she is okay to be around people with her left eye (not right), pt wanted to know if she needs to be seen or can you write a letter clearing her for work, pt doesn't want to have to pay another copay to be seen if you are okay with writing a note for her job

## 2013-10-21 NOTE — Telephone Encounter (Signed)
I can write a note tomorrow when I return - so please send this back to me as a reminder  She needs to tell her boss that Viral conjunctivitis does not require antibiotic drops - it gets better on its own and drops tend to make it worse  The kind that needs antibiotic drops is Bacterial conjunctivitis -that is much different  In any case-if her symptoms do not gradually improve let me know please

## 2013-10-22 NOTE — Telephone Encounter (Signed)
Letter done and in IN box 

## 2013-10-22 NOTE — Telephone Encounter (Signed)
Pt notified note ready for pick-up, pt advised of Dr. Marliss Coots comments and verbalized understanding

## 2013-10-25 ENCOUNTER — Other Ambulatory Visit: Payer: Self-pay | Admitting: Internal Medicine

## 2013-10-25 ENCOUNTER — Other Ambulatory Visit (HOSPITAL_COMMUNITY): Payer: Self-pay | Admitting: Internal Medicine

## 2013-10-27 ENCOUNTER — Other Ambulatory Visit: Payer: Self-pay | Admitting: Family Medicine

## 2013-10-27 NOTE — Telephone Encounter (Signed)
Please refill times one while her endocrinologist is out of the office

## 2013-10-27 NOTE — Telephone Encounter (Signed)
Melonie with [facility] said she checked with Dr Cruzita Lederer office and she is out of office and was advised to call PCP for refill on Lantus. Pt is out of medication. Pt has used all refills given 05/2013. Spoke with Shapele CMA and she will ask Dr Glori Bickers about approval of Lantus and will send [facility] to [facility] if approved. Melonie voiced understanding. But again said pt will not have insulin to take tonight if not refilled.

## 2013-10-27 NOTE — Telephone Encounter (Signed)
Patient need a new prescription, 60 units Lantus, [facility] [address] [address]

## 2013-10-29 ENCOUNTER — Telehealth (HOSPITAL_COMMUNITY): Payer: Self-pay | Admitting: Vascular Surgery

## 2013-10-29 NOTE — Telephone Encounter (Signed)
Results stress test and ultrasound from a few week ago.Marland Kitchen Please advise

## 2013-11-03 ENCOUNTER — Telehealth: Payer: Self-pay | Admitting: Family Medicine

## 2013-11-03 ENCOUNTER — Encounter: Payer: Self-pay | Admitting: Family Medicine

## 2013-11-03 MED ORDER — AMOXICILLIN-POT CLAVULANATE 875-125 MG PO TABS
1.0000 | ORAL_TABLET | Freq: Two times a day (BID) | ORAL | Status: DC
Start: 2013-11-03 — End: 2013-12-27

## 2013-11-03 NOTE — Telephone Encounter (Signed)
Sinusitis not quite better-this is a refill

## 2013-11-04 ENCOUNTER — Telehealth (HOSPITAL_COMMUNITY): Payer: Self-pay | Admitting: Vascular Surgery

## 2013-11-04 NOTE — Telephone Encounter (Signed)
See phone note 8/13

## 2013-11-04 NOTE — Telephone Encounter (Signed)
Pt called about results Echo and Stress test.. Please advise

## 2013-11-04 NOTE — Telephone Encounter (Signed)
Dr Haroldine Laws reviewed results, myoview normal, echo shows ? pfo he recommends TEE to better evaluate, pt is aware of all and agreeable for TEE however she is going out of town on Sat and will be gone for a week, she will call me back to schedule when she returns from [place]

## 2013-11-18 ENCOUNTER — Other Ambulatory Visit: Payer: Self-pay | Admitting: Family Medicine

## 2013-11-19 NOTE — Telephone Encounter (Signed)
Please f/u in 6 mo and refill until then

## 2013-11-19 NOTE — Telephone Encounter (Signed)
Pt has a CPE appt already scheduled so meds refilled until then

## 2013-11-19 NOTE — Telephone Encounter (Signed)
Electronic refill request, please advise  

## 2013-11-22 ENCOUNTER — Telehealth: Payer: Self-pay | Admitting: *Deleted

## 2013-11-22 MED ORDER — INSULIN GLARGINE 100 UNIT/ML SOLOSTAR PEN: PEN_INJECTOR | SUBCUTANEOUS | Status: DC

## 2013-11-22 NOTE — Telephone Encounter (Signed)
Yes-it is 66  She sees Dr Cruzita Lederer now  Please change in chart and change with pharmacy

## 2013-11-22 NOTE — Telephone Encounter (Signed)
Called pharmacy twice and no answer so sent Rx electronically and advise them to cancel the 30 units Rx

## 2013-11-22 NOTE — Telephone Encounter (Signed)
Pharmacist called stating that a refill was sent over to them for Lantus 30 units daily. Patient advised them that she is taking 60 units daily. Pharmacy request a call back to confirm the dose that the patient should be taking?

## 2013-12-12 ENCOUNTER — Other Ambulatory Visit: Payer: Self-pay | Admitting: Family Medicine

## 2013-12-13 NOTE — Telephone Encounter (Signed)
I do not px this -- ? Perhaps her eye doctor

## 2013-12-13 NOTE — Telephone Encounter (Signed)
Rx declined advised pharmacy we don't prescribe Rx

## 2013-12-13 NOTE — Telephone Encounter (Signed)
Electronic refill request, Rx on med list but it doesn't show you prescribed, please advise

## 2013-12-18 ENCOUNTER — Other Ambulatory Visit: Payer: Self-pay | Admitting: Family Medicine

## 2013-12-19 ENCOUNTER — Other Ambulatory Visit: Payer: Self-pay | Admitting: Family Medicine

## 2013-12-19 ENCOUNTER — Telehealth: Payer: Self-pay | Admitting: Family Medicine

## 2013-12-19 DIAGNOSIS — E119 Type 2 diabetes mellitus without complications: Secondary | ICD-10-CM

## 2013-12-19 DIAGNOSIS — Z0000 Encounter for general adult medical examination without abnormal findings: Secondary | ICD-10-CM

## 2013-12-19 NOTE — Telephone Encounter (Signed)
Message copied by [name] on Sun Dec 19, 2013 12:16 PM ------      Message from: Ellamae Sia      Created: Thu Dec 16, 2013  9:25 AM      Regarding: Lab orders for [facility], 9.28.15       Patient is scheduled for CPX labs, please order future labs, Thanks , Terri      ,       Diabetic bundle, A1C, Lipid needed ------

## 2013-12-20 ENCOUNTER — Other Ambulatory Visit (INDEPENDENT_AMBULATORY_CARE_PROVIDER_SITE_OTHER): Payer: BC Managed Care – PPO

## 2013-12-20 DIAGNOSIS — E119 Type 2 diabetes mellitus without complications: Secondary | ICD-10-CM

## 2013-12-20 DIAGNOSIS — Z0000 Encounter for general adult medical examination without abnormal findings: Secondary | ICD-10-CM

## 2013-12-20 LAB — CBC WITH DIFFERENTIAL/PLATELET
BASOS ABS: 0.1 10*3/uL (ref 0.0–0.1)
Basophils Relative: 0.9 % (ref 0.0–3.0)
Eosinophils Absolute: 0.3 10*3/uL (ref 0.0–0.7)
Eosinophils Relative: 4.5 % (ref 0.0–5.0)
HCT: 38.7 % (ref 36.0–46.0)
Hemoglobin: 12.5 g/dL (ref 12.0–15.0)
LYMPHS PCT: 20.3 % (ref 12.0–46.0)
Lymphs Abs: 1.4 10*3/uL (ref 0.7–4.0)
MCHC: 32.3 g/dL (ref 30.0–36.0)
MCV: 86.5 fl (ref 78.0–100.0)
MONOS PCT: 5.7 % (ref 3.0–12.0)
Monocytes Absolute: 0.4 10*3/uL (ref 0.1–1.0)
NEUTROS PCT: 68.6 % (ref 43.0–77.0)
Neutro Abs: 4.7 10*3/uL (ref 1.4–7.7)
Platelets: 276 10*3/uL (ref 150.0–400.0)
RBC: 4.48 Mil/uL (ref 3.87–5.11)
RDW: 15.8 % — AB (ref 11.5–15.5)
WBC: 6.9 10*3/uL (ref 4.0–10.5)

## 2013-12-20 LAB — COMPREHENSIVE METABOLIC PANEL
ALT: 21 U/L (ref 0–35)
AST: 22 U/L (ref 0–37)
Albumin: 4 g/dL (ref 3.5–5.2)
Alkaline Phosphatase: 65 U/L (ref 39–117)
BUN: 10 mg/dL (ref 6–23)
CALCIUM: 9 mg/dL (ref 8.4–10.5)
CHLORIDE: 96 meq/L (ref 96–112)
CO2: 31 meq/L (ref 19–32)
Creatinine, Ser: 0.7 mg/dL (ref 0.4–1.2)
GFR: 96.46 mL/min (ref >60.00)
Glucose, Bld: 220 mg/dL — ABNORMAL HIGH (ref 70–99)
Potassium: 3.9 mEq/L (ref 3.5–5.1)
SODIUM: 135 meq/L (ref 135–145)
Total Bilirubin: 0.5 mg/dL (ref 0.2–1.2)
Total Protein: 7.3 g/dL (ref 6.0–8.3)

## 2013-12-20 LAB — LIPID PANEL
CHOL/HDL RATIO: 4
Cholesterol: 155 mg/dL (ref 0–200)
HDL: 36 mg/dL — AB (ref >39.00)
LDL CALC: 91 mg/dL (ref 0–99)
NonHDL: 119
Triglycerides: 141 mg/dL (ref 0.0–149.0)
VLDL: 28.2 mg/dL (ref 0.0–40.0)

## 2013-12-20 LAB — HEMOGLOBIN A1C: Hgb A1c MFr Bld: 9.7 % — ABNORMAL HIGH (ref 4.6–6.5)

## 2013-12-20 LAB — TSH: TSH: 29.85 u[IU]/mL — ABNORMAL HIGH (ref 0.35–4.50)

## 2013-12-20 NOTE — Telephone Encounter (Signed)
Rx called in as prescribed 

## 2013-12-20 NOTE — Telephone Encounter (Signed)
Px written for call in   

## 2013-12-20 NOTE — Telephone Encounter (Signed)
Electronic refill request please advise  

## 2013-12-27 ENCOUNTER — Encounter: Payer: Self-pay | Admitting: Family Medicine

## 2013-12-27 ENCOUNTER — Ambulatory Visit (INDEPENDENT_AMBULATORY_CARE_PROVIDER_SITE_OTHER): Payer: BC Managed Care – PPO | Admitting: Family Medicine

## 2013-12-27 VITALS — BP 148/84 | HR 69 | Temp 98.5°F | Ht 65.25 in | Wt 282.5 lb

## 2013-12-27 DIAGNOSIS — E1165 Type 2 diabetes mellitus with hyperglycemia: Secondary | ICD-10-CM

## 2013-12-27 DIAGNOSIS — Z23 Encounter for immunization: Secondary | ICD-10-CM

## 2013-12-27 DIAGNOSIS — I10 Essential (primary) hypertension: Secondary | ICD-10-CM

## 2013-12-27 DIAGNOSIS — IMO0002 Reserved for concepts with insufficient information to code with codable children: Secondary | ICD-10-CM

## 2013-12-27 DIAGNOSIS — E782 Mixed hyperlipidemia: Secondary | ICD-10-CM

## 2013-12-27 DIAGNOSIS — Z0000 Encounter for general adult medical examination without abnormal findings: Secondary | ICD-10-CM

## 2013-12-27 MED ORDER — SIMVASTATIN 20 MG PO TABS: ORAL_TABLET | ORAL | Status: DC

## 2013-12-27 MED ORDER — INSULIN GLARGINE 100 UNIT/ML SOLOSTAR PEN: PEN_INJECTOR | SUBCUTANEOUS | Status: DC

## 2013-12-27 MED ORDER — METFORMIN HCL 1000 MG PO TABS: 1000.0000 mg | ORAL_TABLET | Freq: Two times a day (BID) | ORAL | Status: DC

## 2013-12-27 MED ORDER — SERTRALINE HCL 100 MG PO TABS: ORAL_TABLET | ORAL | Status: DC

## 2013-12-27 MED ORDER — PANTOPRAZOLE SODIUM 40 MG PO TBEC: DELAYED_RELEASE_TABLET | ORAL | Status: DC

## 2013-12-27 NOTE — Progress Notes (Signed)
Subjective:    Patient ID: [name], female    DOB: 11-24-1965, 48 y.o.   MRN: [id number]  HPI Here for health maintenance exam and to review chronic medical problems    Feeling ok   Wt is down 2 lb with bmi of 46  Flu vaccine - will get today   Mammogram 9/14- has not had hers yet  She goes to [facility]   Pap 9/14-will make her appt at [facility]   ptx 11/13  Td 12/13   Dm Lab Results  Component Value Date   HGBA1C 9.7* 12/20/2013   this is down from 10.4  She is taking 60 of lantus  Metformin 1000 units twice daily  Her glucose is usually 220-240  She has had several blood sugars - 120-130-- feels weird  Nothing under 70  Does see Dr Cruzita Lederer     Hypothyroid Lab Results  Component Value Date   TSH 29.85* 12/20/2013   she has missed a few doses on the weekend  She sees Dr Cruzita Lederer for this - was supposed to f/u in aug   Hyperlipidemia Lab Results  Component Value Date   CHOL 155 12/20/2013   HDL 36.00* 12/20/2013   LDLCALC 91 12/20/2013   LDLDIRECT 100.0 02/04/2011   TRIG 141.0 12/20/2013   CHOLHDL 4 12/20/2013    HDL is a bit low  LDL at goal    Stress family moved into her house- 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 Routine general medical examination at a health care facility 12/19/2013  . Viral conjunctivitis 10/19/2013  . Palpitations 02/03/2013  . Nonspecific abnormal toxicology 10/29/2012  . Yeast vaginitis 10/16/2012  . Other malaise and fatigue 07/27/2012  . 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 Papillary thyroid carcinoma 04/27/2012  . Chronic diastolic heart failure 76/16/0737  . Acute renal failure 01/25/2012  . Diastolic CHF 10/62/6948  . Acute diastolic heart failure 54/62/7035  . OSA (obstructive sleep apnea) just got cpap at home this past week 01/17/12 01/17/2012  . Hypertensive urgency  01/16/2012  . CHF (congestive heart failure) 01/16/2012  . Anxiety 01/16/2012  . Chronic allergic rhinitis 10/26/2010  . Insomnia 10/26/2010  . OBESITY, MORBID 04/27/2010  . HEMORRHOIDS, INTERNAL, WITH BLEEDING 08/17/2009  . GERD 04/27/2008  . Essential hypertension 11/03/2007  . HYPERLIPIDEMIA 05/14/2007  . OTHER ORGANIC SLEEP DISORDERS 03/09/2007  . Diabetes type 2, uncontrolled 09/11/2006  . POLYCYSTIC OVARIES 09/11/2006  . ANEMIA-IRON DEFICIENCY 09/11/2006  . DEPRESSION 09/11/2006  . EXTERNAL HEMORRHOIDS 09/11/2006  . ASTHMA 09/11/2006   Past Medical History  Diagnosis Date  . Anemia     iron deficiency  . Asthma     a. 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  . Depression     a. Stress reaction 08/2011 in multiple social stressors.  . Diabetes mellitus   . Hypertension   . Morbid obesity   . Hyperlipidemia   . Vertigo   . PCOS (polycystic ovarian syndrome)   . Gastritis   . Insomnia   . GERD (gastroesophageal reflux disease)   . OSA (obstructive sleep apnea) 01/17/2012  . CHF (congestive heart failure)   . Dysrhythmia   . Hyperthyroidism   . Anxiety   . Chronic kidney disease     /13  . Hemorrhoid   .  Arthritis   . Complication of anesthesia     DIFFICULT WAKING- 2011  . Papillary thyroid carcinoma    Past Surgical History  Procedure Laterality Date  . Cholecystectomy    . Hemorrhoid surgery    . Tubal ligation    . Shoulder surgery  06/2009  . Tonsillectomy and adenoidectomy  CHILD  . Intrauterine device insertion  2011  . Thyroidectomy  04/10/2012    Procedure: THYROIDECTOMY;  Surgeon: Jerrell Belfast, MD;  Location: [place];  Service: ENT;  Laterality: N/A;  Total Thyroidectomy   History  Substance Use Topics  . Smoking status: Former Smoker -- 0.20 packs/day for 5 years    Quit date: 03/25/2004  . Smokeless tobacco: Never Used  . Alcohol Use: No   Family History  Problem Relation Age of Onset  . Cancer  Father     colon cancer  . Arthritis Father   . Arthritis Mother   . Asthma Mother   . Hypertension Mother    Allergies  Allergen Reactions  . Tape Rash    Plastic tape  . Buspirone Hcl Nausea Only  . Glipizide     REACTION: sleepy, low sugar  . Ivp Dye [Iodinated Diagnostic Agents]     Shut kidneys down  . Oxycodone-Acetaminophen Itching   Current Outpatient Prescriptions on File Prior to Visit  Medication Sig Dispense Refill  . albuterol (PROAIR HFA) 108 (90 BASE) MCG/ACT inhaler Inhale 2 puffs into the lungs every 4 (four) hours as needed. For wheezing  1 Inhaler  4  . B-D ULTRAFINE III SHORT PEN 31G X 8 MM MISC USE 1 DAILY DX CODE 250.0  100 each  1  . buPROPion (WELLBUTRIN XL) 150 MG 24 hr tablet TAKE 1 TABLET BY MOUTH DAILY  30 tablet  5  . carvedilol (COREG) 3.125 MG tablet TAKE 1 TABLET BY MOUTH TWICE A DAY WITH A MEAL  60 tablet  5  . cetirizine (ZYRTEC) 10 MG tablet Take 10 mg by mouth daily.      . ferrous sulfate 325 (65 FE) MG tablet Take 325 mg by mouth 2 (two) times daily.        . fluticasone (FLONASE) 50 MCG/ACT nasal spray Place 2 sprays into the nose daily as needed. For allergies      . Insulin Glargine (LANTUS SOLOSTAR) 100 UNIT/ML Solostar Pen INJECT 60 UNITS INTO THE SKIN AT BEDTIME  15 pen  0  . levothyroxine (SYNTHROID, LEVOTHROID) 175 MCG tablet TAKE 1 TABLET BY MOUTH DAILY BEFORE BREAKFAST.  30 tablet  1  . metFORMIN (GLUCOPHAGE) 1000 MG tablet Take 1 tablet (1,000 mg total) by mouth 2 (two) times daily with a meal.  180 tablet  3  . ONE TOUCH ULTRA TEST test strip USE AS DIRECTED TO CHECK BLOOD SUGAR TWICE DAILY  100 each  1  . pantoprazole (PROTONIX) 40 MG tablet TAKE 1 TABLET BY MOUTH DAILY  30 tablet  5  . potassium chloride 20 MEQ TBCR Take 20 mEq by mouth 2 (two) times a week. Take with torsemide  30 tablet  6  . sertraline (ZOLOFT) 100 MG tablet TAKE 1 TABLET (100 MG TOTAL) BY MOUTH DAILY.  30 tablet  1  . simvastatin (ZOCOR) 20 MG tablet TAKE 1  TABLET BY MOUTH EVERY EVENING  30 tablet  1  . spironolactone (ALDACTONE) 25 MG tablet TAKE 1 TABLET BY MOUTH EVERY DAY  30 tablet  6  . torsemide (DEMADEX) 20 MG tablet Take  20 mg by mouth 2 (two) times a week. Wed and Sat      . TRAVATAN Z 0.004 % SOLN ophthalmic solution       . zolpidem (AMBIEN) 10 MG tablet TAKE 1/2 TO 1 TABLET BY MOUTH ATBEDTIME AS NEEDED FOR SLEEP  30 tablet  3   No current facility-administered medications on file prior to visit.      Review of Systems    Review of Systems  Constitutional: Negative for fever, appetite change,  and unexpected weight change. pos for fatigue  Eyes: Negative for pain and visual disturbance.  Respiratory: Negative for cough and shortness of breath.   Cardiovascular: Negative for cp or palpitations    Gastrointestinal: Negative for nausea, diarrhea and constipation.  Genitourinary: Negative for urgency and frequency.  Skin: Negative for pallor or rash   Neurological: Negative for weakness, light-headedness, numbness and headaches.  Hematological: Negative for adenopathy. Does not bruise/bleed easily.  Psychiatric/Behavioral: Negative for dysphoric mood. The patient is not nervous/anxious.  pos for stressors     Objective:   Physical Exam  Constitutional: She appears well-developed and well-nourished. No distress.  obese and well appearing     HENT:  Head: Normocephalic and atraumatic.  Right Ear: External ear normal.  Left Ear: External ear normal.  Nose: Nose normal.  Mouth/Throat: Oropharynx is clear and moist.  Eyes: Conjunctivae and EOM are normal. Pupils are equal, round, and reactive to light. Right eye exhibits no discharge. Left eye exhibits no discharge. No scleral icterus.  Neck: Normal range of motion. Neck supple. No JVD present. No thyromegaly present.  Cardiovascular: Normal rate, regular rhythm, normal heart sounds and intact distal pulses.  Exam reveals no gallop.   Pulmonary/Chest: Effort normal and breath  sounds normal. No respiratory distress. She has no wheezes. She has no rales.  Abdominal: Soft. Bowel sounds are normal. She exhibits no distension and no mass. There is no tenderness.  Musculoskeletal: She exhibits no edema and no tenderness.  Lymphadenopathy:    She has no cervical adenopathy.  Neurological: She is alert. She has normal reflexes. No cranial nerve deficit. She exhibits normal muscle tone. Coordination normal.  Skin: Skin is warm and dry. No rash noted. No erythema. No pallor.  Psychiatric: She has a normal mood and affect.          Assessment & Plan:   Problem List Items Addressed This Visit     Cardiovascular and Mediastinum   Essential hypertension - Primary      bp is stable today  No cp or palpitations or headaches or edema  No side effects to medicines  BP Readings from Last 3 Encounters:  12/27/13 148/84  10/19/13 140/86  10/01/13 158/86         Relevant Medications      simvastatin (ZOCOR) tablet     Other   Diabetes type 2, uncontrolled     Overdue for visit to endocrinology (Dr Cruzita Lederer) Poor control  Disc diet/exercise and need for wt loss  Will gradually increase lantus while waiting for appt with Dr Cruzita Lederer - up to 100 but stopping if sugars get low  Flu vaccine today    Relevant Medications      Insulin Glargine (LANTUS SOLOSTAR) 100 UNIT/ML Solostar Pen      metFORMIN (GLUCOPHAGE) tablet      simvastatin (ZOCOR) tablet   HYPERLIPIDEMIA     Disc goals for lipids and reasons to control them Rev labs with pt Rev low sat  fat diet in detail At goal for LDL Need to get HDL up however-disc imp of exercise     Relevant Medications      simvastatin (ZOCOR) tablet   OBESITY, MORBID     Discussed how this problem influences overall health and the risks it imposes  Reviewed plan for weight loss with lower calorie diet (via better food choices and also portion control or program like weight watchers) and exercise building up to or more than  30 minutes 5 days per week including some aerobic activity        Relevant Medications      Insulin Glargine (LANTUS SOLOSTAR) 100 UNIT/ML Solostar Pen      metFORMIN (GLUCOPHAGE) tablet   Routine general medical examination at a health care facility     Reviewed health habits including diet and exercise and skin cancer prevention Reviewed appropriate screening tests for age  Also reviewed health mt list, fam hx and immunization status , as well as social and family history   Lab rev See HPI Flu vaccine today     Other Visit Diagnoses   Need for influenza vaccination        Relevant Orders       Flu Vaccine QUAD 36+ mos PF IM (Fluarix Quad PF) (Completed)

## 2013-12-27 NOTE — Patient Instructions (Signed)
Flu vaccine today  Increase lantus as follows  70 units daily for a week, then  80 units daily for a week, then 90 units daily for a week, then  100 units daily  If you start getting low glucose - (under 80 or if you feel bad )- stop at that dose   Stop at check out to schedule f/u appt with Dr Cruzita Lederer

## 2013-12-27 NOTE — Assessment & Plan Note (Signed)
bp is stable today  No cp or palpitations or headaches or edema  No side effects to medicines  BP Readings from Last 3 Encounters:  12/27/13 148/84  10/19/13 140/86  10/01/13 158/86

## 2013-12-27 NOTE — Assessment & Plan Note (Signed)
Disc goals for lipids and reasons to control them Rev labs with pt Rev low sat fat diet in detail At goal for LDL Need to get HDL up however-disc imp of exercise

## 2013-12-27 NOTE — Assessment & Plan Note (Signed)
Discussed how this problem influences overall health and the risks it imposes  Reviewed plan for weight loss with lower calorie diet (via better food choices and also portion control or program like weight watchers) and exercise building up to or more than 30 minutes 5 days per week including some aerobic activity    

## 2013-12-27 NOTE — Progress Notes (Signed)
Pre visit review using our clinic review tool, if applicable. No additional management support is needed unless otherwise documented below in the visit note. 

## 2013-12-27 NOTE — Assessment & Plan Note (Signed)
Overdue for visit to endocrinology (Dr Cruzita Lederer) Poor control  Disc diet/exercise and need for wt loss  Will gradually increase lantus while waiting for appt with Dr Cruzita Lederer - up to 100 but stopping if sugars get low  Flu vaccine today

## 2013-12-27 NOTE — Assessment & Plan Note (Signed)
Reviewed health habits including diet and exercise and skin cancer prevention Reviewed appropriate screening tests for age  Also reviewed health mt list, fam hx and immunization status , as well as social and family history   Lab rev See HPI Flu vaccine today

## 2013-12-28 ENCOUNTER — Telehealth: Payer: Self-pay | Admitting: Family Medicine

## 2013-12-28 NOTE — Telephone Encounter (Signed)
emmi emailed °

## 2014-01-13 ENCOUNTER — Encounter: Payer: Self-pay | Admitting: Internal Medicine

## 2014-01-13 ENCOUNTER — Ambulatory Visit (INDEPENDENT_AMBULATORY_CARE_PROVIDER_SITE_OTHER): Payer: BC Managed Care – PPO | Admitting: Internal Medicine

## 2014-01-13 VITALS — BP 142/90 | HR 76 | Temp 98.3°F | Resp 12 | Wt 284.0 lb

## 2014-01-13 DIAGNOSIS — E1165 Type 2 diabetes mellitus with hyperglycemia: Secondary | ICD-10-CM

## 2014-01-13 DIAGNOSIS — C73 Malignant neoplasm of thyroid gland: Secondary | ICD-10-CM

## 2014-01-13 DIAGNOSIS — IMO0002 Reserved for concepts with insufficient information to code with codable children: Secondary | ICD-10-CM

## 2014-01-13 DIAGNOSIS — E890 Postprocedural hypothyroidism: Secondary | ICD-10-CM

## 2014-01-13 MED ORDER — LEVOTHYROXINE SODIUM 175 MCG PO TABS: ORAL_TABLET | ORAL | Status: DC

## 2014-01-13 MED ORDER — CANAGLIFLOZIN 100 MG PO TABS: ORAL_TABLET | ORAL | Status: DC

## 2014-01-13 MED ORDER — INSULIN GLARGINE 100 UNIT/ML SOLOSTAR PEN: PEN_INJECTOR | SUBCUTANEOUS | Status: DC

## 2014-01-13 NOTE — Patient Instructions (Signed)
Please continue Lantus 80 units at bedtime. Continue Metformin 1000 mg 2x a day with meals. Start Invokana 100 mg in am.  Take the thyroid hormone every day, with water, >30 minutes before breakfast, separated by >4 hours from acid reflux medications, calcium, iron, multivitamins.  Please return in 1 month with your sugar log.

## 2014-01-13 NOTE — Progress Notes (Signed)
Patient ID: [name], female   DOB: December 12, 1965, 48 y.o.   MRN: [id number]  HPI: [name] is a 48 y.o.-year-old female, returning for f/u for DM2, dx 2008, insulin-dependent since ~2011, uncontrolled, with complications (peripheral neuropathy, dCHF). I am also following the pt for h/o papillary thyroid cancer/iatrogenic hypothyroidism. Last visit 1.5 mo ago.  DM2: Last hemoglobin A1c was: Lab Results  Component Value Date   HGBA1C 9.7* 12/20/2013   HGBA1C 10.4* 10/01/2013   HGBA1C 10.7* 10/20/2012    Pt is on a regimen of: - Lantus solostar 80 units qhs - Metformin 1000 mg bid  - started 11/2013 >> tolerates it well She was on Metformin but taken off during a hospitalization last year with CHF and ARF. She could tolerate it well in the past. She was also on Glipizide but developed hypoglycemia >> loss of consciousness. She was also on Amaryl >> taken off.   Pt checks her sugars 1 a day and they are: - am: 210-300 >> n/c - bedtime: 210-300 >> 178, 225-240 No lows. Lowest sugar was 100s in last 6 mo; she has hypoglycemia awareness at 180. Highest sugar was 260.  Pt's meals are: - Breakfast: skips - 10 o'clock: banana, apple, nectarine - Lunch: fast food - Dinner: goes out to eat around 8 o'clock: chicken sandwich or a wrap - Snacks: 0, no concentrated sweets  - no CKD, last BUN/creatinine:  Lab Results  Component Value Date   BUN 10 12/20/2013   CREATININE 0.7 12/20/2013  Last ACR was 3.4 on 10/20/2012. Not on ACEI/ARB.   - last set of lipids: Lab Results  Component Value Date   CHOL 155 12/20/2013   HDL 36.00* 12/20/2013   LDLCALC 91 12/20/2013   LDLDIRECT 100.0 02/04/2011   TRIG 141.0 12/20/2013   CHOLHDL 4 12/20/2013  She is on Simvastatin.  - last eye exam was in 08/2013 . No DR. Has glaucoma - on drops. - + numbness and tingling in her feet.  H/o subcm PTC, iatrogenic hypothyroidism Pt is post total thyroidectomy on 04/10/2012 by Dr Wilburn Cornelia for  compressive goiter. It was incidentally found that she had a subcentimeter focus of papillary thyroid cancer, follicular variant. No RAITx.  She was taking the Synthroid in am along with her PPI >> advised to separate them by >4h. And also to move iron from  10 am to later! No calcium or MVI. After the above changes, the TSH normalized, but the next one was high, again, as she was not taking the medicine consistently (forgets it or she cannot afford it). She restarted taking it every day after the last TSH check.  Reviewed TSH levels: Lab Results  Component Value Date   TSH 29.85* 12/20/2013   TSH 1.86 09/04/2012   TSH 9.63* 07/27/2012   TSH 9.74 Repeated and verified X2.* 06/26/2012   TSH 24.58* 05/22/2012   TSH 1.528 01/18/2012   TSH 2.90 04/27/2010   TSH 2.86 04/20/2008   TSH 2.58 06/17/2006   ROS: Constitutional: no weight gain, + fatigue, + subjective hyperthermia (hot flushes) Eyes: no blurry vision, no xerophthalmia ENT: no sore throat, no nodules palpated in throat, + occasional dysphagia/odynophagia, no hoarseness, + tinnitus Cardiovascular: no CP/+ SOB/+ palpitations/leg swelling Respiratory: no cough/+ SOB/+ wheezing Gastrointestinal: no N/V/+D/no C Musculoskeletal: + muscle/ + joint aches Skin: no rashes; + hair loss Neurological: no tremors/numbness/tingling/dizziness, + HA  I reviewed pt's medications, allergies, PMH, social hx, family hx and no changes required, except as mentioned  above.  PE: BP 142/90  Pulse 76  Temp(Src) 98.3 F (36.8 C) (Oral)  Resp 12  Wt 284 lb (128.822 kg)  SpO2 95% Wt Readings from Last 3 Encounters:  01/13/14 284 lb (128.822 kg)  12/27/13 282 lb 8 oz (128.141 kg)  10/19/13 284 lb 4 oz (128.935 kg)   Constitutional: obese, central obesity, in NAD Eyes: PERRLA, EOMI, no exophthalmos ENT: moist mucous membranes, no thyromegaly, no cervical lymphadenopathy Cardiovascular: RRR, No MRG Respiratory: CTA B Gastrointestinal: abdomen soft, NT, ND,  BS+ Musculoskeletal: no deformities, strength intact in all 4 Skin: moist, warm, no rashes  ASSESSMENT: 1. DM2, non-insulin-dependent, uncontrolled, without complications  2. Iatrogenic Hypothyroidism  3. 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 Patient with a 6 yr h/o uncontrolled diabetes, on basal insulin regimen + metformin, which is still insufficient - We discussed about options for treatment, and I advised her to add Invokana Patient Instructions  Please continue Lantus 80 units at bedtime. Continue Metformin 1000 mg 2x a day with meals. Start Invokana 100 mg in am.  Take the thyroid hormone every day, with water, >30 minutes before breakfast, separated by >4 hours from acid reflux medications, calcium, iron, multivitamins.  Please return in 1 month with your sugar log.  - we discussed about SEs of Invokana, which are: dizziness (advised to be careful when stands from sitting position), decreased BP - usually not < normal (BP today is not low), and fungal UTIs (advised to let me know if develops one).  - given discount card for Invokana - continue checking her sugars at different times of the day - check 2 times a day, rotating checks - advised for yearly eye exams >> she is up to date - Return to clinic in one month with sugar log - she already had the flu vaccine this season - we will check a BMP at next visit   2. Iatrogenic Hypothyr - advised to take thew LT4 every day! - will recheck TFTs

## 2014-02-14 ENCOUNTER — Ambulatory Visit (INDEPENDENT_AMBULATORY_CARE_PROVIDER_SITE_OTHER): Payer: BC Managed Care – PPO | Admitting: Internal Medicine

## 2014-02-14 ENCOUNTER — Encounter: Payer: Self-pay | Admitting: Internal Medicine

## 2014-02-14 VITALS — BP 132/88 | HR 66 | Temp 98.1°F | Resp 12 | Wt 275.0 lb

## 2014-02-14 DIAGNOSIS — IMO0002 Reserved for concepts with insufficient information to code with codable children: Secondary | ICD-10-CM

## 2014-02-14 DIAGNOSIS — E1165 Type 2 diabetes mellitus with hyperglycemia: Secondary | ICD-10-CM

## 2014-02-14 DIAGNOSIS — E890 Postprocedural hypothyroidism: Secondary | ICD-10-CM

## 2014-02-14 MED ORDER — INSULIN GLARGINE 300 UNIT/ML ~~LOC~~ SOPN: 80.0000 [IU] | PEN_INJECTOR | Freq: Every day | SUBCUTANEOUS | Status: DC

## 2014-02-14 MED ORDER — GLUCOSE BLOOD VI STRP: ORAL_STRIP | Status: DC

## 2014-02-14 NOTE — Patient Instructions (Signed)
Please stop Lantus and start Toujeo 80 units at bedtime. Continue Metformin 1000 mg 2x a day. Continue Invokana 100 mg in am.  Please return in 1 month with your sugar log.   Please stop at the lab.

## 2014-02-14 NOTE — Progress Notes (Signed)
Patient ID: [name], female   DOB: 03-15-1966, 48 y.o.   MRN: [id number]  HPI: GENEVER Hodges is a 48 y.o.-year-old female, returning for f/u for DM2, dx 2008, insulin-dependent since ~2011, uncontrolled, with complications (peripheral neuropathy, dCHF). I am also following the pt for h/o papillary thyroid cancer and iatrogenic hypothyroidism. Last visit 1.5 mo ago.  DM2: Last hemoglobin A1c was: Lab Results  Component Value Date   HGBA1C 9.7* 12/20/2013   HGBA1C 10.4* 10/01/2013   HGBA1C 10.7* 10/20/2012    Pt is on a regimen of: - Lantus solostar 80 units qhs - Metformin 1000 mg bid  - started 11/2013 >> tolerates it well - Invokana 100 mg  - started 12/2013 She was on Metformin but taken off during a hospitalization last year with CHF and ARF. She could tolerate it well in the past. She was also on Glipizide but developed hypoglycemia >> loss of consciousness. She was also on Amaryl >> taken off.   Pt checks her sugars 1-2x a day and they are better: - am: 210-300 >> n/c >> 123-160 - before lunch: 116-163 - before dinner: 113 - bedtime: 210-300 >> 178, 225-240 >> 155-178, 211 No lows. Lowest sugar was 100s in last 6 mo; she has hypoglycemia awareness at 180. Highest sugar was 260.  Pt's meals are: - Breakfast: skips - 10 o'clock: banana, apple, nectarine - Lunch: fast food - Dinner: goes out to eat around 8 o'clock: chicken sandwich or a wrap - Snacks: 0, no concentrated sweets  - no CKD, last BUN/creatinine:  Lab Results  Component Value Date   BUN 10 12/20/2013   CREATININE 0.7 12/20/2013  Last ACR was 3.4 on 10/20/2012. Not on ACEI/ARB.   - last set of lipids: Lab Results  Component Value Date   CHOL 155 12/20/2013   HDL 36.00* 12/20/2013   LDLCALC 91 12/20/2013   LDLDIRECT 100.0 02/04/2011   TRIG 141.0 12/20/2013   CHOLHDL 4 12/20/2013  She is on Simvastatin.  - last eye exam was in 08/2013 . No DR. Has glaucoma - on drops. - + numbness and  tingling in her feet.  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 It was incidentally found that she had a subcentimeter focus of papillary thyroid cancer, follicular variant. No RAITx.  She was taking the Synthroid in am along with her PPI: - fasting - with water or sprite - with Invokana - iron with lunch and bedtime - Protonix at bedtime - no MVI - no Calcium  After we moved the PPI from am >>  the TSH normalized, but the next level was high, again, as she was not taking the medicine consistently (forgot it / she cannot afford it). She restarted taking it every day after the last TSH check.  Reviewed TSH levels: Lab Results  Component Value Date   TSH 29.85* 12/20/2013   TSH 1.86 09/04/2012   TSH 9.63* 07/27/2012   TSH 9.74 Repeated and verified X2.* 06/26/2012   TSH 24.58* 05/22/2012   TSH 1.528 01/18/2012   TSH 2.90 04/27/2010   TSH 2.86 04/20/2008   TSH 2.58 06/17/2006   ROS: Constitutional: no weight gain, + fatigue, + subjective hyperthermia (hot flushes) Eyes: no blurry vision, no xerophthalmia ENT: no sore throat, no nodules palpated in throat, no dysphagia/odynophagia, no hoarseness Cardiovascular: no CP/+ SOB/+ palpitations/no leg swelling Respiratory: no cough/+ SOB/no wheezing Gastrointestinal: no N/V/D/C Musculoskeletal: no muscle/ joint  aches Skin: no rashes; + hair loss Neurological: no tremors/numbness/tingling/dizziness, + HA + low libido  I reviewed pt's medications, allergies, PMH, social hx, family hx and no changes required, except as mentioned above.  PE: BP 132/88 mmHg  Pulse 66  Temp(Src) 98.1 F (36.7 C) (Oral)  Resp 12  Wt 275 lb (124.739 kg)  SpO2 96% Wt Readings from Last 3 Encounters:  02/14/14 275 lb (124.739 kg)  01/13/14 284 lb (128.822 kg)  12/27/13 282 lb 8 oz (128.141 kg)   Constitutional: obese, central obesity, in NAD Eyes: PERRLA, EOMI, no  exophthalmos ENT: moist mucous membranes, no thyromegaly, no cervical lymphadenopathy Cardiovascular: RRR, No MRG Respiratory: CTA B Gastrointestinal: abdomen soft, NT, ND, BS+ Musculoskeletal: no deformities, strength intact in all 4 Skin: moist, warm, no rashes  ASSESSMENT: 1. DM2, insulin-dependent, uncontrolled, without complications  2. Iatrogenic Hypothyroidism - h/o med noncompliance  3. 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 Patient with a 6 yr h/o uncontrolled diabetes, on basal insulin regimen + metformin, with improvement after adding Invokana >> no SEs from [place] but she appears dehydrated and has had HAs >> advised to increase water intake. - We discussed about options for treatment, and I advised her to switch from Lantus to Toujeo 2/2 large dose  Patient Instructions  Please stop Lantus and start Toujeo 80 units at bedtime. Continue Metformin 1000 mg 2x a day. Continue Invokana 100 mg in am.  Please return in 1 month with your sugar log.   Please stop at the lab.   - continue checking her sugars at different times of the day - check 2 times a day, rotating checks - advised for yearly eye exams >> she is up to date - Return to clinic in one month with sugar log - she already had the flu vaccine this season - we will check a BMP today  2. Iatrogenic Hypothyr - will recheck TFTs  - reassessed how she is taking the LT4 >> she is now taking it correctly, every day - continue LT4 175 mcg daily - needs a refill of LT4    Office Visit on 02/14/2014  Component Date Value Ref Range Status  . TSH 02/14/2014 2.96  0.35 - 4.50 uIU/mL Final  . Free T4 02/14/2014 1.21  0.60 - 1.60 ng/dL Final  . Sodium 02/14/2014 138  135 - 145 mEq/L Final  . Potassium 02/14/2014 3.4* 3.5 - 5.1 mEq/L Final  . Chloride 02/14/2014 97  96 - 112 mEq/L Final  . CO2 02/14/2014 30  19 - 32 mEq/L Final  . Glucose, Bld 02/14/2014 149* 70 - 99 mg/dL Final   . BUN 02/14/2014 12  6 - 23 mg/dL Final  . Creatinine, Ser 02/14/2014 0.8  0.4 - 1.2 mg/dL Final  . Calcium 02/14/2014 9.7  8.4 - 10.5 mg/dL Final  . GFR 02/14/2014 81.27  >60.00 mL/min Final   TSH great! Potassium a little low.  Glu much better. BUN/Cr normal.

## 2014-02-15 LAB — BASIC METABOLIC PANEL
BUN: 12 mg/dL (ref 6–23)
CALCIUM: 9.7 mg/dL (ref 8.4–10.5)
CO2: 30 mEq/L (ref 19–32)
Chloride: 97 mEq/L (ref 96–112)
Creatinine, Ser: 0.8 mg/dL (ref 0.4–1.2)
GFR: 81.27 mL/min (ref >60.00)
Glucose, Bld: 149 mg/dL — ABNORMAL HIGH (ref 70–99)
POTASSIUM: 3.4 meq/L — AB (ref 3.5–5.1)
SODIUM: 138 meq/L (ref 135–145)

## 2014-02-15 LAB — T4, FREE: FREE T4: 1.21 ng/dL (ref 0.60–1.60)

## 2014-02-15 LAB — TSH: TSH: 2.96 u[IU]/mL (ref 0.35–4.50)

## 2014-02-15 MED ORDER — LEVOTHYROXINE SODIUM 175 MCG PO TABS: ORAL_TABLET | ORAL | Status: DC

## 2014-02-22 DIAGNOSIS — L03119 Cellulitis of unspecified part of limb: Secondary | ICD-10-CM

## 2014-02-22 DIAGNOSIS — L02419 Cutaneous abscess of limb, unspecified: Secondary | ICD-10-CM

## 2014-02-22 HISTORY — DX: Cutaneous abscess of limb, unspecified: L02.419

## 2014-02-22 HISTORY — DX: Cellulitis of unspecified part of limb: L03.119

## 2014-02-28 ENCOUNTER — Telehealth: Payer: Self-pay

## 2014-02-28 NOTE — Telephone Encounter (Signed)
Pt has appt 03/01/14 at 12:30 with Dr Glori Bickers.

## 2014-02-28 NOTE — Telephone Encounter (Signed)
I will see her then  

## 2014-02-28 NOTE — Telephone Encounter (Signed)
PLEASE NOTE: All timestamps contained within this report are represented as [place] Standard Time. CONFIDENTIALTY NOTICE: This fax transmission is intended only for the addressee. It contains information that is legally privileged, confidential or otherwise protected from use or disclosure. If you are not the intended recipient, you are strictly prohibited from reviewing, disclosing, copying using or disseminating any of this information or taking any action in reliance on or regarding this information. If you have received this fax in error, please notify us immediately by telephone so that we can arrange for its return to [place]. Phone: [phone], Toll-Free: [phone], Fax: [phone] Page: 1 of 2 Call Id: [id number] [place] Patient Name: [name] Gender: Female DOB: Aug 11, 1965 Age: 48 Y 26 M Return Phone Number: [phone] (Primary), [phone] (Secondary) Address: [id number] [place] [address] Client [facility] - Client Client Site [place], [facility] [place] Contact Type Call Call Type Triage / Clinical Relationship To Patient Self Return Phone Number [phone] (Primary) Chief Complaint Sore Throat Initial Comment Caller states she is having an earache, trouble swallowing and a sore throat. PreDisposition Call Doctor Nurse Assessment Nurse: Rock Nephew, RN, Juliann Pulse Date/Time (Eastern Time): 02/28/2014 1:16:03 PM Confirm and document reason for call. If symptomatic, describe symptoms. ---Caller states she is having an earache, trouble swallowing and a sore throat. Has the patient traveled out of the country within the last 30 days? ---Not Applicable Does the patient require triage? ---Yes Related visit to physician within the last 2 weeks? ---No Does the PT have any chronic conditions? (i.e.  diabetes, asthma, etc.) ---No Did the patient indicate they were pregnant? ---No Guidelines Guideline Title Affirmed Question Affirmed Notes Nurse Date/Time Eilene Ghazi Time) Sore Throat Earache also present Rock Nephew, RN, Juliann Pulse 02/28/2014 1:16:40 PM Disp. Time Eilene Ghazi Time) Disposition Final User 02/28/2014 12:45:15 PM Attempt made - message left Rock Nephew, RN, Juliann Pulse 02/28/2014 1:22:46 PM See Physician within 24 Hours Yes Rock Nephew, RN, Gara Kroner Understands: Yes Disagree/Comply: Comply Care Advice Given Per Guideline SEE PHYSICIAN WITHIN 24 HOURS: * IF OFFICE WILL BE OPEN: You need to be examined within the next 24 hours. Call your doctor when the office opens, and make an appointment. SORE THROAT - For relief of sore throat: * Sip warm chicken broth PLEASE NOTE: All timestamps contained within this report are represented as [place] Standard Time. CONFIDENTIALTY NOTICE: This fax transmission is intended only for the addressee. It contains information that is legally privileged, confidential or otherwise protected from use or disclosure. If you are not the intended recipient, you are strictly prohibited from reviewing, disclosing, copying using or disseminating any of this information or taking any action in reliance on or regarding this information. If you have received this fax in error, please notify us immediately by telephone so that we can arrange for its return to [place]. Phone: [phone], Toll-Free: [phone], Fax: [phone] Page: 2 of 2 Call Id: [id number] Care Advice Given Per Guideline or apple juice. * Suck on hard candy or a throat lozenge (OTC). * Gargle with warm salt water four times a day. To make salt water, put 1/2 teaspoon of salt in 8 oz (240 ml) of warm water. SOFT DIET: * Eat a soft diet. Cold drinks, popsicles, and milk shakes are especially good. Avoid citrus fruits. * Drink plenty of liquids so as to avoid dehydration (8-12 eight oz glasses each day). CALL BACK  IF: * You  become worse. CARE ADVICE given per Sore Throat (Adult) guideline. After Care Instructions Given Call Event Type User Date / Time Description Comments User: Valetta Mole, RN Date/Time Eilene Ghazi Time): 02/28/2014 1:23:26 PM caller warm transferred to [facility] for an appt Referrals REFERRED TO PCP OFFICE

## 2014-03-01 ENCOUNTER — Ambulatory Visit (INDEPENDENT_AMBULATORY_CARE_PROVIDER_SITE_OTHER): Payer: BC Managed Care – PPO | Admitting: Family Medicine

## 2014-03-01 ENCOUNTER — Ambulatory Visit: Payer: BC Managed Care – PPO | Admitting: Family Medicine

## 2014-03-01 ENCOUNTER — Encounter: Payer: Self-pay | Admitting: Family Medicine

## 2014-03-01 VITALS — BP 144/78 | HR 67 | Temp 98.6°F | Ht 65.25 in | Wt 278.5 lb

## 2014-03-01 DIAGNOSIS — J029 Acute pharyngitis, unspecified: Secondary | ICD-10-CM

## 2014-03-01 DIAGNOSIS — J020 Streptococcal pharyngitis: Secondary | ICD-10-CM | POA: Insufficient documentation

## 2014-03-01 LAB — POCT RAPID STREP A (OFFICE): RAPID STREP A SCREEN: POSITIVE — AB

## 2014-03-01 MED ORDER — AMOXICILLIN 500 MG PO CAPS: 500.0000 mg | ORAL_CAPSULE | Freq: Three times a day (TID) | ORAL | Status: DC

## 2014-03-01 NOTE — Patient Instructions (Signed)
Drink lots of fluids and get rest Tylenol (acetaminophen) for fever or throat pain  Gargling with salt water is fine  If symptoms worsen and you cannot swallow- let me know  Update if not starting to improve in a week or if worsening

## 2014-03-01 NOTE — Progress Notes (Signed)
Pre visit review using our clinic review tool, if applicable. No additional management support is needed unless otherwise documented below in the visit note. 

## 2014-03-01 NOTE — Progress Notes (Signed)
Subjective:    Patient ID: [name], female    DOB: 1965/11/03, 48 y.o.   MRN: [id number]  HPI Here with ST and ear pain   Was really bad over the weekend - sharp pain in throat rad to both ears  Was miserable   Has gargled with warm salt water and losenges Tylenol for pain Glands under jaw are sore  No cough Some runny nose   ? Fever-had chills sat night   Patient Active Problem List   Diagnosis Date Noted  . Routine general medical examination at a health care facility 12/19/2013  . Palpitations 02/03/2013  . Nonspecific abnormal toxicology 10/29/2012  . Yeast vaginitis 10/16/2012  . 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 Papillary thyroid carcinoma 04/27/2012  . Chronic diastolic heart failure 62/94/7654  . Acute renal failure 01/25/2012  . Diastolic CHF 65/05/5463  . Acute diastolic heart failure 68/02/7516  . OSA (obstructive sleep apnea) just got cpap at home this past week 01/17/12 01/17/2012  . Hypertensive urgency 01/16/2012  . CHF (congestive heart failure) 01/16/2012  . Anxiety 01/16/2012  . Chronic allergic rhinitis 10/26/2010  . Insomnia 10/26/2010  . OBESITY, MORBID 04/27/2010  . HEMORRHOIDS, INTERNAL, WITH BLEEDING 08/17/2009  . GERD 04/27/2008  . Essential hypertension 11/03/2007  . HYPERLIPIDEMIA 05/14/2007  . OTHER ORGANIC SLEEP DISORDERS 03/09/2007  . Diabetes type 2, uncontrolled 09/11/2006  . POLYCYSTIC OVARIES 09/11/2006  . ANEMIA-IRON DEFICIENCY 09/11/2006  . DEPRESSION 09/11/2006  . EXTERNAL HEMORRHOIDS 09/11/2006  . ASTHMA 09/11/2006   Past Medical History  Diagnosis Date  . Anemia     iron deficiency  . Asthma     a. 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  . Depression     a. Stress reaction 08/2011 in multiple social stressors.  . Diabetes mellitus   . Hypertension   . Morbid obesity   . Hyperlipidemia   . Vertigo   . PCOS (polycystic ovarian syndrome)   . Gastritis   .  Insomnia   . GERD (gastroesophageal reflux disease)   . OSA (obstructive sleep apnea) 01/17/2012  . CHF (congestive heart failure)   . Dysrhythmia   . Hyperthyroidism   . Anxiety   . Chronic kidney disease     /13  . Hemorrhoid   . Arthritis   . Complication of anesthesia     DIFFICULT WAKING- 2011  . Papillary thyroid carcinoma    Past Surgical History  Procedure Laterality Date  . Cholecystectomy    . Hemorrhoid surgery    . Tubal ligation    . Shoulder surgery  06/2009  . Tonsillectomy and adenoidectomy  CHILD  . Intrauterine device insertion  2011  . Thyroidectomy  04/10/2012    Procedure: THYROIDECTOMY;  Surgeon: Jerrell Belfast, MD;  Location: [facility];  Service: ENT;  Laterality: N/A;  Total Thyroidectomy   History  Substance Use Topics  . Smoking status: Former Smoker -- 0.20 packs/day for 5 years    Quit date: 03/25/2004  . Smokeless tobacco: Never Used  . Alcohol Use: No   Family History  Problem Relation Age of Onset  . Cancer Father     colon cancer  . Arthritis Father   . Arthritis Mother   . Asthma Mother   . Hypertension Mother    Allergies  Allergen Reactions  . Tape Rash    Plastic tape  . Buspirone Hcl Nausea Only  . Glipizide     REACTION: sleepy, low  sugar  . Ivp Dye [Iodinated Diagnostic Agents]     Shut kidneys down  . Oxycodone-Acetaminophen Itching   Current Outpatient Prescriptions on File Prior to Visit  Medication Sig Dispense Refill  . albuterol (PROAIR HFA) 108 (90 BASE) MCG/ACT inhaler Inhale 2 puffs into the lungs every 4 (four) hours as needed. For wheezing 1 Inhaler 4  . B-D ULTRAFINE III SHORT PEN 31G X 8 MM MISC USE 1 DAILY DX CODE 250.0 100 each 1  . buPROPion (WELLBUTRIN XL) 150 MG 24 hr tablet TAKE 1 TABLET BY MOUTH DAILY 30 tablet 5  . Canagliflozin (INVOKANA) 100 MG TABS Take 100 mg daily in am 30 tablet 2  . carvedilol (COREG) 3.125 MG tablet TAKE 1 TABLET BY MOUTH TWICE A DAY WITH A MEAL 60 tablet 5  . cetirizine  (ZYRTEC) 10 MG tablet Take 10 mg by mouth daily.    . ferrous sulfate 325 (65 FE) MG tablet Take 325 mg by mouth 2 (two) times daily.      . fluticasone (FLONASE) 50 MCG/ACT nasal spray Place 2 sprays into the nose daily as needed. For allergies    . glucose blood (ONE TOUCH ULTRA TEST) test strip Use 2x a day 100 each 2  . Insulin Glargine (TOUJEO SOLOSTAR) 300 UNIT/ML SOPN Inject 80 Units into the skin at bedtime. 10 pen 2  . levothyroxine (SYNTHROID, LEVOTHROID) 175 MCG tablet TAKE 1 TABLET BY MOUTH DAILY BEFORE BREAKFAST. 30 tablet 2  . metFORMIN (GLUCOPHAGE) 1000 MG tablet Take 1 tablet (1,000 mg total) by mouth 2 (two) times daily with a meal. 180 tablet 3  . pantoprazole (PROTONIX) 40 MG tablet TAKE 1 TABLET BY MOUTH DAILY 30 tablet 11  . potassium chloride 20 MEQ TBCR Take 20 mEq by mouth 2 (two) times a week. Take with torsemide 30 tablet 6  . sertraline (ZOLOFT) 100 MG tablet TAKE 1 TABLET (100 MG TOTAL) BY MOUTH DAILY. 30 tablet 11  . simvastatin (ZOCOR) 20 MG tablet TAKE 1 TABLET BY MOUTH EVERY EVENING 30 tablet 11  . spironolactone (ALDACTONE) 25 MG tablet TAKE 1 TABLET BY MOUTH EVERY DAY 30 tablet 6  . torsemide (DEMADEX) 20 MG tablet Take 20 mg by mouth 2 (two) times a week. Wed and Sat    . TRAVATAN Z 0.004 % SOLN ophthalmic solution     . zolpidem (AMBIEN) 10 MG tablet TAKE 1/2 TO 1 TABLET BY MOUTH ATBEDTIME AS NEEDED FOR SLEEP 30 tablet 3   No current facility-administered medications on file prior to visit.       Review of Systems Review of Systems  Constitutional: Negative for , appetite change, and unexpected weight change. pos for fatigue and malaise ENT pos for cong and ST Eyes: Negative for pain and visual disturbance.  Respiratory: Negative for cough and shortness of breath.   Cardiovascular: Negative for cp or palpitations    Gastrointestinal: Negative for nausea, diarrhea and constipation.  Genitourinary: Negative for urgency and frequency.  Skin: Negative for  pallor or rash   Neurological: Negative for weakness, light-headedness, numbness and headaches.  Hematological: Negative for adenopathy. Does not bruise/bleed easily.  Psychiatric/Behavioral: Negative for dysphoric mood. The patient is not nervous/anxious.         Objective:   Physical Exam  Constitutional: She appears well-developed and well-nourished. No distress.  obese and well appearing   HENT:  Head: Normocephalic and atraumatic.  Right Ear: External ear normal.  Left Ear: External ear normal.  Mouth/Throat: Oropharynx  is clear and moist. No oropharyngeal exudate.  Nares are injected and congested  -mildly  Throat is erythematous  No exudate or swelling  No sinus tenderness   Eyes: Conjunctivae and EOM are normal. Pupils are equal, round, and reactive to light. Right eye exhibits no discharge. Left eye exhibits no discharge. No scleral icterus.  Neck: Normal range of motion. Neck supple.  Cardiovascular: Normal rate, regular rhythm and normal heart sounds.   Pulmonary/Chest: Effort normal and breath sounds normal. No respiratory distress. She has no wheezes. She has no rales.  Musculoskeletal: She exhibits no edema.  Lymphadenopathy:    She has no cervical adenopathy.  Neurological: She is alert. She has normal reflexes.  Skin: Skin is warm and dry. No rash noted. No erythema. No pallor.  Psychiatric: She has a normal mood and affect.          Assessment & Plan:   Problem List Items Addressed This Visit      Respiratory   Strep pharyngitis    Cover with amoxicillin  Out of work today Disc symptomatic care - see instructions on AVS  Update if not starting to improve in a week or if worsening       Other Visit Diagnoses    Sore throat    -  Primary    Relevant Orders       Rapid Strep A (Completed)

## 2014-03-02 ENCOUNTER — Telehealth: Payer: Self-pay | Admitting: Family Medicine

## 2014-03-02 NOTE — Telephone Encounter (Signed)
Patient called and said she didn't feel well enough to go to work today.  She will go to work tomorrow.  She needs a note stating patient's not contagious and that she can return to work on 03/03/14. Please call patient when note is ready.

## 2014-03-02 NOTE — Telephone Encounter (Signed)
Done and in IN box 

## 2014-03-02 NOTE — Telephone Encounter (Signed)
Pt notified letter ready for pick up. 

## 2014-03-02 NOTE — Assessment & Plan Note (Signed)
Cover with amoxicillin  Out of work today Disc symptomatic care - see instructions on AVS  Update if not starting to improve in a week or if worsening

## 2014-03-21 ENCOUNTER — Encounter (HOSPITAL_COMMUNITY): Payer: Self-pay | Admitting: Emergency Medicine

## 2014-03-21 DIAGNOSIS — J45909 Unspecified asthma, uncomplicated: Secondary | ICD-10-CM | POA: Diagnosis present

## 2014-03-21 DIAGNOSIS — E785 Hyperlipidemia, unspecified: Secondary | ICD-10-CM | POA: Diagnosis present

## 2014-03-21 DIAGNOSIS — F329 Major depressive disorder, single episode, unspecified: Secondary | ICD-10-CM | POA: Diagnosis present

## 2014-03-21 DIAGNOSIS — N189 Chronic kidney disease, unspecified: Secondary | ICD-10-CM | POA: Diagnosis present

## 2014-03-21 DIAGNOSIS — E0590 Thyrotoxicosis, unspecified without thyrotoxic crisis or storm: Secondary | ICD-10-CM | POA: Diagnosis present

## 2014-03-21 DIAGNOSIS — A419 Sepsis, unspecified organism: Principal | ICD-10-CM | POA: Diagnosis present

## 2014-03-21 DIAGNOSIS — E282 Polycystic ovarian syndrome: Secondary | ICD-10-CM | POA: Diagnosis present

## 2014-03-21 DIAGNOSIS — Z87891 Personal history of nicotine dependence: Secondary | ICD-10-CM

## 2014-03-21 DIAGNOSIS — I129 Hypertensive chronic kidney disease with stage 1 through stage 4 chronic kidney disease, or unspecified chronic kidney disease: Secondary | ICD-10-CM | POA: Diagnosis present

## 2014-03-21 DIAGNOSIS — Z9049 Acquired absence of other specified parts of digestive tract: Secondary | ICD-10-CM | POA: Diagnosis present

## 2014-03-21 DIAGNOSIS — F419 Anxiety disorder, unspecified: Secondary | ICD-10-CM | POA: Diagnosis present

## 2014-03-21 DIAGNOSIS — Z91048 Other nonmedicinal substance allergy status: Secondary | ICD-10-CM

## 2014-03-21 DIAGNOSIS — I5032 Chronic diastolic (congestive) heart failure: Secondary | ICD-10-CM | POA: Diagnosis present

## 2014-03-21 DIAGNOSIS — E1165 Type 2 diabetes mellitus with hyperglycemia: Secondary | ICD-10-CM | POA: Diagnosis present

## 2014-03-21 DIAGNOSIS — E890 Postprocedural hypothyroidism: Secondary | ICD-10-CM | POA: Diagnosis present

## 2014-03-21 DIAGNOSIS — R509 Fever, unspecified: Secondary | ICD-10-CM | POA: Diagnosis not present

## 2014-03-21 DIAGNOSIS — K219 Gastro-esophageal reflux disease without esophagitis: Secondary | ICD-10-CM | POA: Diagnosis present

## 2014-03-21 DIAGNOSIS — L03115 Cellulitis of right lower limb: Secondary | ICD-10-CM | POA: Diagnosis present

## 2014-03-21 DIAGNOSIS — M1990 Unspecified osteoarthritis, unspecified site: Secondary | ICD-10-CM | POA: Diagnosis present

## 2014-03-21 DIAGNOSIS — G4733 Obstructive sleep apnea (adult) (pediatric): Secondary | ICD-10-CM | POA: Diagnosis present

## 2014-03-21 DIAGNOSIS — E876 Hypokalemia: Secondary | ICD-10-CM | POA: Diagnosis not present

## 2014-03-21 DIAGNOSIS — Z85850 Personal history of malignant neoplasm of thyroid: Secondary | ICD-10-CM

## 2014-03-21 DIAGNOSIS — Z794 Long term (current) use of insulin: Secondary | ICD-10-CM

## 2014-03-21 DIAGNOSIS — Z6841 Body Mass Index (BMI) 40.0 and over, adult: Secondary | ICD-10-CM

## 2014-03-21 LAB — CBC WITH DIFFERENTIAL/PLATELET
BASOS ABS: 0 10*3/uL (ref 0.0–0.1)
BASOS PCT: 0 % (ref 0–1)
EOS ABS: 0 10*3/uL (ref 0.0–0.7)
EOS PCT: 0 % (ref 0–5)
HCT: 39.8 % (ref 36.0–46.0)
Hemoglobin: 13.1 g/dL (ref 12.0–15.0)
LYMPHS ABS: 0.6 10*3/uL — AB (ref 0.7–4.0)
Lymphocytes Relative: 4 % — ABNORMAL LOW (ref 12–46)
MCH: 27.3 pg (ref 26.0–34.0)
MCHC: 32.9 g/dL (ref 30.0–36.0)
MCV: 83.1 fL (ref 78.0–100.0)
Monocytes Absolute: 0.7 10*3/uL (ref 0.1–1.0)
Monocytes Relative: 5 % (ref 3–12)
NEUTROS PCT: 91 % — AB (ref 43–77)
Neutro Abs: 13.9 10*3/uL — ABNORMAL HIGH (ref 1.7–7.7)
Platelets: 256 10*3/uL (ref 150–400)
RBC: 4.79 MIL/uL (ref 3.87–5.11)
RDW: 14.8 % (ref 11.5–15.5)
WBC: 15.3 10*3/uL — ABNORMAL HIGH (ref 4.0–10.5)

## 2014-03-21 LAB — CBG MONITORING, ED: GLUCOSE-CAPILLARY: 167 mg/dL — AB (ref 70–99)

## 2014-03-21 MED ORDER — ONDANSETRON 4 MG PO TBDP
8.0000 mg | ORAL_TABLET | Freq: Once | ORAL | Status: AC
  Administered 2014-03-21: 8 mg via ORAL
  Filled 2014-03-21: qty 2

## 2014-03-21 NOTE — ED Notes (Signed)
The patient has been having diarrhea, nausea, vomiting and abdominal pain since 0900hrs.  She also has been having palpitations earlier today she is no longer feeling the palpitations.  She says she is a diabetic and has a place on her leg that is concerning.  She has mult\iple complaints but the main one is her abdominal pain, nausea and vomiting.

## 2014-03-22 ENCOUNTER — Inpatient Hospital Stay (HOSPITAL_COMMUNITY)
Admission: EM | Admit: 2014-03-22 | Discharge: 2014-03-29 | DRG: 872 | Disposition: A | Discharge status: Home / Self Care | Payer: BLUE CROSS/BLUE SHIELD | Attending: Internal Medicine | Admitting: Internal Medicine

## 2014-03-22 ENCOUNTER — Other Ambulatory Visit: Payer: Self-pay | Admitting: Internal Medicine

## 2014-03-22 DIAGNOSIS — A419 Sepsis, unspecified organism: Secondary | ICD-10-CM | POA: Diagnosis present

## 2014-03-22 DIAGNOSIS — I5032 Chronic diastolic (congestive) heart failure: Secondary | ICD-10-CM | POA: Diagnosis present

## 2014-03-22 DIAGNOSIS — E1165 Type 2 diabetes mellitus with hyperglycemia: Secondary | ICD-10-CM | POA: Diagnosis present

## 2014-03-22 DIAGNOSIS — E1140 Type 2 diabetes mellitus with diabetic neuropathy, unspecified: Secondary | ICD-10-CM | POA: Diagnosis present

## 2014-03-22 DIAGNOSIS — Z6841 Body Mass Index (BMI) 40.0 and over, adult: Secondary | ICD-10-CM | POA: Diagnosis not present

## 2014-03-22 DIAGNOSIS — E890 Postprocedural hypothyroidism: Secondary | ICD-10-CM | POA: Diagnosis present

## 2014-03-22 DIAGNOSIS — E785 Hyperlipidemia, unspecified: Secondary | ICD-10-CM | POA: Diagnosis present

## 2014-03-22 DIAGNOSIS — E782 Mixed hyperlipidemia: Secondary | ICD-10-CM | POA: Diagnosis present

## 2014-03-22 DIAGNOSIS — Z794 Long term (current) use of insulin: Secondary | ICD-10-CM | POA: Diagnosis not present

## 2014-03-22 DIAGNOSIS — Z87891 Personal history of nicotine dependence: Secondary | ICD-10-CM | POA: Diagnosis not present

## 2014-03-22 DIAGNOSIS — I10 Essential (primary) hypertension: Secondary | ICD-10-CM | POA: Diagnosis not present

## 2014-03-22 DIAGNOSIS — J45909 Unspecified asthma, uncomplicated: Secondary | ICD-10-CM | POA: Diagnosis present

## 2014-03-22 DIAGNOSIS — J4520 Mild intermittent asthma, uncomplicated: Secondary | ICD-10-CM | POA: Diagnosis not present

## 2014-03-22 DIAGNOSIS — M1990 Unspecified osteoarthritis, unspecified site: Secondary | ICD-10-CM | POA: Diagnosis present

## 2014-03-22 DIAGNOSIS — F419 Anxiety disorder, unspecified: Secondary | ICD-10-CM | POA: Diagnosis present

## 2014-03-22 DIAGNOSIS — E1142 Type 2 diabetes mellitus with diabetic polyneuropathy: Secondary | ICD-10-CM | POA: Diagnosis present

## 2014-03-22 DIAGNOSIS — K219 Gastro-esophageal reflux disease without esophagitis: Secondary | ICD-10-CM | POA: Diagnosis present

## 2014-03-22 DIAGNOSIS — E876 Hypokalemia: Secondary | ICD-10-CM | POA: Diagnosis not present

## 2014-03-22 DIAGNOSIS — Z85850 Personal history of malignant neoplasm of thyroid: Secondary | ICD-10-CM | POA: Diagnosis not present

## 2014-03-22 DIAGNOSIS — L0390 Cellulitis, unspecified: Secondary | ICD-10-CM

## 2014-03-22 DIAGNOSIS — Z91048 Other nonmedicinal substance allergy status: Secondary | ICD-10-CM | POA: Diagnosis not present

## 2014-03-22 DIAGNOSIS — L03115 Cellulitis of right lower limb: Secondary | ICD-10-CM | POA: Insufficient documentation

## 2014-03-22 DIAGNOSIS — E282 Polycystic ovarian syndrome: Secondary | ICD-10-CM | POA: Diagnosis present

## 2014-03-22 DIAGNOSIS — F329 Major depressive disorder, single episode, unspecified: Secondary | ICD-10-CM | POA: Diagnosis present

## 2014-03-22 DIAGNOSIS — C73 Malignant neoplasm of thyroid gland: Secondary | ICD-10-CM | POA: Diagnosis present

## 2014-03-22 DIAGNOSIS — R509 Fever, unspecified: Secondary | ICD-10-CM | POA: Diagnosis present

## 2014-03-22 DIAGNOSIS — Z9049 Acquired absence of other specified parts of digestive tract: Secondary | ICD-10-CM | POA: Diagnosis present

## 2014-03-22 DIAGNOSIS — E1169 Type 2 diabetes mellitus with other specified complication: Secondary | ICD-10-CM | POA: Diagnosis present

## 2014-03-22 DIAGNOSIS — F418 Other specified anxiety disorders: Secondary | ICD-10-CM | POA: Diagnosis present

## 2014-03-22 DIAGNOSIS — G4733 Obstructive sleep apnea (adult) (pediatric): Secondary | ICD-10-CM | POA: Diagnosis present

## 2014-03-22 DIAGNOSIS — E0590 Thyrotoxicosis, unspecified without thyrotoxic crisis or storm: Secondary | ICD-10-CM | POA: Diagnosis present

## 2014-03-22 DIAGNOSIS — N189 Chronic kidney disease, unspecified: Secondary | ICD-10-CM | POA: Diagnosis present

## 2014-03-22 DIAGNOSIS — IMO0002 Reserved for concepts with insufficient information to code with codable children: Secondary | ICD-10-CM | POA: Diagnosis present

## 2014-03-22 DIAGNOSIS — I129 Hypertensive chronic kidney disease with stage 1 through stage 4 chronic kidney disease, or unspecified chronic kidney disease: Secondary | ICD-10-CM | POA: Diagnosis present

## 2014-03-22 HISTORY — DX: Cellulitis of unspecified part of limb: L03.119

## 2014-03-22 HISTORY — DX: Cutaneous abscess of limb, unspecified: L02.419

## 2014-03-22 LAB — CBC
HCT: 36.5 % (ref 36.0–46.0)
Hemoglobin: 11.8 g/dL — ABNORMAL LOW (ref 12.0–15.0)
MCH: 27.3 pg (ref 26.0–34.0)
MCHC: 32.3 g/dL (ref 30.0–36.0)
MCV: 84.5 fL (ref 78.0–100.0)
PLATELETS: 217 10*3/uL (ref 150–400)
RBC: 4.32 MIL/uL (ref 3.87–5.11)
RDW: 15 % (ref 11.5–15.5)
WBC: 11 10*3/uL — AB (ref 4.0–10.5)

## 2014-03-22 LAB — URINALYSIS, ROUTINE W REFLEX MICROSCOPIC
BILIRUBIN URINE: NEGATIVE
Glucose, UA: >1000 mg/dL — AB
Ketones, ur: NEGATIVE mg/dL
LEUKOCYTES UA: NEGATIVE
Nitrite: NEGATIVE
Protein, ur: NEGATIVE mg/dL
SPECIFIC GRAVITY, URINE: 1.027 (ref 1.005–1.030)
UROBILINOGEN UA: 0.2 mg/dL (ref 0.0–1.0)
pH: 5.5 (ref 5.0–8.0)

## 2014-03-22 LAB — COMPREHENSIVE METABOLIC PANEL
ALBUMIN: 4.1 g/dL (ref 3.5–5.2)
ALT: 19 U/L (ref 0–35)
AST: 17 U/L (ref 0–37)
Alkaline Phosphatase: 69 U/L (ref 39–117)
Anion gap: 15 (ref 5–15)
BUN: 9 mg/dL (ref 6–23)
CALCIUM: 9.6 mg/dL (ref 8.4–10.5)
CO2: 21 mmol/L (ref 19–32)
Chloride: 98 mEq/L (ref 96–112)
Creatinine, Ser: 0.79 mg/dL (ref 0.50–1.10)
GFR calc non Af Amer: >90 mL/min (ref >90)
GLUCOSE: 186 mg/dL — AB (ref 70–99)
Potassium: 3.6 mmol/L (ref 3.5–5.1)
SODIUM: 134 mmol/L — AB (ref 135–145)
TOTAL PROTEIN: 7.8 g/dL (ref 6.0–8.3)
Total Bilirubin: 0.8 mg/dL (ref 0.3–1.2)

## 2014-03-22 LAB — BRAIN NATRIURETIC PEPTIDE: B NATRIURETIC PEPTIDE 5: 104.5 pg/mL — AB (ref 0.0–100.0)

## 2014-03-22 LAB — LIPASE, BLOOD: Lipase: 20 U/L (ref 11–59)

## 2014-03-22 LAB — BASIC METABOLIC PANEL
Anion gap: 9 (ref 5–15)
BUN: 8 mg/dL (ref 6–23)
CO2: 28 mmol/L (ref 19–32)
Calcium: 9.2 mg/dL (ref 8.4–10.5)
Chloride: 99 mEq/L (ref 96–112)
Creatinine, Ser: 0.75 mg/dL (ref 0.50–1.10)
GFR calc Af Amer: >90 mL/min (ref >90)
GLUCOSE: 136 mg/dL — AB (ref 70–99)
Potassium: 3.2 mmol/L — ABNORMAL LOW (ref 3.5–5.1)
SODIUM: 136 mmol/L (ref 135–145)

## 2014-03-22 LAB — URINE MICROSCOPIC-ADD ON

## 2014-03-22 LAB — I-STAT TROPONIN, ED: TROPONIN I, POC: 0 ng/mL (ref 0.00–0.08)

## 2014-03-22 LAB — PROTIME-INR
INR: 1.24 (ref 0.00–1.49)
Prothrombin Time: 15.7 seconds — ABNORMAL HIGH (ref 11.6–15.2)

## 2014-03-22 LAB — POC URINE PREG, ED: Preg Test, Ur: NEGATIVE

## 2014-03-22 LAB — GLUCOSE, CAPILLARY: Glucose-Capillary: 150 mg/dL — ABNORMAL HIGH (ref 70–99)

## 2014-03-22 MED ORDER — PANTOPRAZOLE SODIUM 40 MG PO TBEC
40.0000 mg | DELAYED_RELEASE_TABLET | Freq: Every day | ORAL | Status: DC
  Administered 2014-03-22 – 2014-03-29 (×8): 40 mg via ORAL
  Filled 2014-03-22 (×9): qty 1

## 2014-03-22 MED ORDER — FERROUS SULFATE 325 (65 FE) MG PO TABS
325.0000 mg | ORAL_TABLET | Freq: Two times a day (BID) | ORAL | Status: DC
  Administered 2014-03-22 – 2014-03-29 (×15): 325 mg via ORAL
  Filled 2014-03-22 (×18): qty 1

## 2014-03-22 MED ORDER — ALBUTEROL SULFATE (2.5 MG/3ML) 0.083% IN NEBU
2.5000 mg | INHALATION_SOLUTION | RESPIRATORY_TRACT | Status: DC | PRN
Start: 2014-03-22 — End: 2014-03-29

## 2014-03-22 MED ORDER — POTASSIUM CHLORIDE CRYS ER 20 MEQ PO TBCR
40.0000 meq | EXTENDED_RELEASE_TABLET | ORAL | Status: AC
  Administered 2014-03-22 (×2): 40 meq via ORAL
  Filled 2014-03-22 (×3): qty 2

## 2014-03-22 MED ORDER — SERTRALINE HCL 100 MG PO TABS
100.0000 mg | ORAL_TABLET | Freq: Every day | ORAL | Status: DC
  Administered 2014-03-22 – 2014-03-29 (×8): 100 mg via ORAL
  Filled 2014-03-22 (×2): qty 1
  Filled 2014-03-22: qty 2
  Filled 2014-03-22 (×5): qty 1

## 2014-03-22 MED ORDER — PIPERACILLIN-TAZOBACTAM 3.375 G IVPB
3.3750 g | Freq: Three times a day (TID) | INTRAVENOUS | Status: DC
  Administered 2014-03-22 – 2014-03-25 (×9): 3.375 g via INTRAVENOUS
  Filled 2014-03-22 (×11): qty 50

## 2014-03-22 MED ORDER — ACETAMINOPHEN 325 MG PO TABS
650.0000 mg | ORAL_TABLET | Freq: Once | ORAL | Status: AC
  Administered 2014-03-22: 650 mg via ORAL
  Filled 2014-03-22: qty 2

## 2014-03-22 MED ORDER — INSULIN GLARGINE 300 UNIT/ML ~~LOC~~ SOPN: 60.0000 [IU] | PEN_INJECTOR | Freq: Every day | SUBCUTANEOUS | Status: DC

## 2014-03-22 MED ORDER — HEPARIN SODIUM (PORCINE) 5000 UNIT/ML IJ SOLN
5000.0000 [IU] | Freq: Three times a day (TID) | INTRAMUSCULAR | Status: DC
  Administered 2014-03-22 – 2014-03-29 (×22): 5000 [IU] via SUBCUTANEOUS
  Filled 2014-03-22 (×24): qty 1

## 2014-03-22 MED ORDER — ONDANSETRON HCL 4 MG/2ML IJ SOLN
4.0000 mg | Freq: Once | INTRAMUSCULAR | Status: AC
  Administered 2014-03-22: 4 mg via INTRAVENOUS
  Filled 2014-03-22: qty 2

## 2014-03-22 MED ORDER — CLINDAMYCIN PHOSPHATE 600 MG/50ML IV SOLN
600.0000 mg | Freq: Once | INTRAVENOUS | Status: AC
  Administered 2014-03-22: 600 mg via INTRAVENOUS
  Filled 2014-03-22: qty 50

## 2014-03-22 MED ORDER — VANCOMYCIN HCL 10 G IV SOLR
2000.0000 mg | Freq: Once | INTRAVENOUS | Status: AC
  Administered 2014-03-22: 2000 mg via INTRAVENOUS
  Filled 2014-03-22: qty 2000

## 2014-03-22 MED ORDER — ZOLPIDEM TARTRATE 5 MG PO TABS
5.0000 mg | ORAL_TABLET | Freq: Every evening | ORAL | Status: DC | PRN
  Administered 2014-03-22 – 2014-03-28 (×7): 5 mg via ORAL
  Filled 2014-03-22 (×8): qty 1

## 2014-03-22 MED ORDER — SODIUM CHLORIDE 0.9 % IV SOLN
INTRAVENOUS | Status: AC
  Administered 2014-03-22: 14:00:00 via INTRAVENOUS

## 2014-03-22 MED ORDER — LORATADINE 10 MG PO TABS
10.0000 mg | ORAL_TABLET | Freq: Every day | ORAL | Status: DC
  Administered 2014-03-22 – 2014-03-29 (×8): 10 mg via ORAL
  Filled 2014-03-22 (×8): qty 1

## 2014-03-22 MED ORDER — SIMVASTATIN 20 MG PO TABS
20.0000 mg | ORAL_TABLET | Freq: Every day | ORAL | Status: DC
  Administered 2014-03-22 – 2014-03-28 (×7): 20 mg via ORAL
  Filled 2014-03-22 (×9): qty 1

## 2014-03-22 MED ORDER — INSULIN GLARGINE 100 UNIT/ML ~~LOC~~ SOLN
60.0000 [IU] | Freq: Every day | SUBCUTANEOUS | Status: DC
  Administered 2014-03-22: 60 [IU] via SUBCUTANEOUS
  Filled 2014-03-22 (×2): qty 0.6

## 2014-03-22 MED ORDER — SODIUM CHLORIDE 0.9 % IJ SOLN
3.0000 mL | Freq: Two times a day (BID) | INTRAMUSCULAR | Status: DC
  Administered 2014-03-22 – 2014-03-29 (×8): 3 mL via INTRAVENOUS
  Filled 2014-03-22: qty 3

## 2014-03-22 MED ORDER — VANCOMYCIN HCL IN DEXTROSE 1-5 GM/200ML-% IV SOLN
1000.0000 mg | Freq: Three times a day (TID) | INTRAVENOUS | Status: DC
  Administered 2014-03-22 – 2014-03-28 (×18): 1000 mg via INTRAVENOUS
  Filled 2014-03-22 (×21): qty 200

## 2014-03-22 MED ORDER — BUPROPION HCL ER (XL) 150 MG PO TB24
150.0000 mg | ORAL_TABLET | Freq: Every day | ORAL | Status: DC
  Administered 2014-03-22 – 2014-03-29 (×8): 150 mg via ORAL
  Filled 2014-03-22 (×8): qty 1

## 2014-03-22 MED ORDER — FLUTICASONE PROPIONATE 50 MCG/ACT NA SUSP
2.0000 | Freq: Every day | NASAL | Status: DC
  Administered 2014-03-22 – 2014-03-29 (×5): 2 via NASAL
  Filled 2014-03-22: qty 16

## 2014-03-22 MED ORDER — LATANOPROST 0.005 % OP SOLN
1.0000 [drp] | Freq: Every day | OPHTHALMIC | Status: DC
Start: 2014-03-22 — End: 2014-03-29
  Administered 2014-03-22 – 2014-03-28 (×7): 1 [drp] via OPHTHALMIC
  Filled 2014-03-22: qty 2.5

## 2014-03-22 MED ORDER — ONDANSETRON HCL 4 MG/2ML IJ SOLN
4.0000 mg | Freq: Three times a day (TID) | INTRAMUSCULAR | Status: DC | PRN
  Administered 2014-03-22 (×2): 4 mg via INTRAVENOUS
  Filled 2014-03-22 (×2): qty 2

## 2014-03-22 MED ORDER — LEVOTHYROXINE SODIUM 175 MCG PO TABS
175.0000 ug | ORAL_TABLET | Freq: Every day | ORAL | Status: DC
  Administered 2014-03-22 – 2014-03-29 (×8): 175 ug via ORAL
  Filled 2014-03-22 (×10): qty 1

## 2014-03-22 MED ORDER — ACETAMINOPHEN 325 MG PO TABS
325.0000 mg | ORAL_TABLET | ORAL | Status: DC | PRN
  Administered 2014-03-22 – 2014-03-28 (×8): 325 mg via ORAL
  Filled 2014-03-22 (×8): qty 1

## 2014-03-22 MED ORDER — SODIUM CHLORIDE 0.9 % IV BOLUS (SEPSIS)
1000.0000 mL | Freq: Once | INTRAVENOUS | Status: AC
  Administered 2014-03-22: 1000 mL via INTRAVENOUS

## 2014-03-22 MED ORDER — CARVEDILOL 3.125 MG PO TABS
3.1250 mg | ORAL_TABLET | Freq: Two times a day (BID) | ORAL | Status: DC
  Administered 2014-03-22 – 2014-03-29 (×15): 3.125 mg via ORAL
  Filled 2014-03-22 (×20): qty 1

## 2014-03-22 NOTE — ED Notes (Signed)
Ordered diabetic tray for lunch

## 2014-03-22 NOTE — ED Notes (Signed)
Informed pt that we need a urine sample. Pt is unable to go at this time.

## 2014-03-22 NOTE — Progress Notes (Signed)
ANTIBIOTIC CONSULT NOTE - INITIAL  Pharmacy Consult for Vancomycin and Zosyn  Indication: cellulitis  Allergies  Allergen Reactions  . Tape Rash    Plastic tape  . Buspirone Hcl Nausea Only  . Glipizide     REACTION: sleepy, low sugar  . Ivp Dye [Iodinated Diagnostic Agents]     Shut kidneys down  . Oxycodone-Acetaminophen Itching    Patient Measurements: Height: 5\' 5"  (165.1 cm) Weight: 284 lb (128.822 kg) IBW/kg (Calculated) : 57 Adjusted Body Weight: 85 kg  Vital Signs: Temp: 101.6 F (38.7 C) (12/29 0223) Temp Source: Oral (12/29 0223) BP: 126/66 mmHg (12/29 0600) Pulse Rate: 78 (12/29 0600) Intake/Output from previous day:   Intake/Output from this shift:    Labs:  Recent Labs  03/21/14 2317  WBC 15.3*  HGB 13.1  PLT 256  CREATININE 0.79   Estimated Creatinine Clearance: 116.3 mL/min (by C-G formula based on Cr of 0.79). No results for input(s): VANCOTROUGH, VANCOPEAK, VANCORANDOM, GENTTROUGH, GENTPEAK, GENTRANDOM, TOBRATROUGH, TOBRAPEAK, TOBRARND, AMIKACINPEAK, AMIKACINTROU, AMIKACIN in the last 72 hours.   Microbiology: No results found for this or any previous visit (from the past 720 hour(s)).  Medical History: Past Medical History  Diagnosis Date  . Anemia     iron deficiency  . Asthma     a. 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  . Depression     a. Stress reaction 08/2011 in multiple social stressors.  . Diabetes mellitus   . Hypertension   . Morbid obesity   . Hyperlipidemia   . Vertigo   . PCOS (polycystic ovarian syndrome)   . Gastritis   . Insomnia   . GERD (gastroesophageal reflux disease)   . OSA (obstructive sleep apnea) 01/17/2012  . CHF (congestive heart failure)   . Dysrhythmia   . Hyperthyroidism   . Anxiety   . Chronic kidney disease     /13  . Hemorrhoid   . Arthritis   . Complication of anesthesia     DIFFICULT WAKING- 2011  . Papillary thyroid carcinoma     Medications:   Albuterol  Wellbutrin  Invokana  Coreg  Zyrtec  Iron  Flonase  Toujeo  Synthroid  Metformin  Protonix  KCl  Zoloft  Zocor  Aldactone  Demadex  Travatan  Ambien  Assessment: 48 yo female with RLE cellulitis for empiric antibiotics  Goal of Therapy:  Vancomycin trough level 10-15 mcg/ml  Plan:  Vancomycin 2 g IV now, then 1 g IV q8h Zosyn 3.375 g IV q8h   [name] 03/22/2014,6:16 AM

## 2014-03-22 NOTE — ED Provider Notes (Signed)
CSN: [id number]     Arrival date & time 03/21/14  2253 History   First MD Initiated Contact with Patient 03/22/14 0211    This chart was scribed for Blanchie Dessert, MD by Terressa Koyanagi, ED Scribe. This patient was seen in room A06C/A06C and the patient's care was started at 2:12 AM.  Chief Complaint  Patient presents with  . Palpitations    The patient has been having diarrhea, nausea, vomiting and abdominal pain since 0900hrs.  She also has been having palpitations earlier today.  . Nausea  . Emesis   Patient is a 48 y.o. female presenting with vomiting. The history is provided by the patient. No language interpreter was used.  Emesis  PCP: Loura Pardon, MD HPI Comments: GALEN MALKOWSKI is a 48 y.o. female, with an extensive medical Hx noted below including DM, who presents to the Emergency Department complaining of  RLE pain with associated redness in her RLE and fever onset today. During exam pt's temperature is 101.68F. Pt denies any Hx of cellulitis or MRSA. Pt also complains of a HA onset today.   As a secondary matter, unrelated to the primary complaint, pt also complains of intermittent, improving, nearly resolved n/v/d (with last episode of vomiting occuring around 7:30PM yesterday) with associated abd pain and nose bleeds (when she throws up) onset yesterday morning.   Past Medical History  Diagnosis Date  . Anemia     iron deficiency  . Asthma     a. 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  . Depression     a. Stress reaction 08/2011 in multiple social stressors.  . Diabetes mellitus   . Hypertension   . Morbid obesity   . Hyperlipidemia   . Vertigo   . PCOS (polycystic ovarian syndrome)   . Gastritis   . Insomnia   . GERD (gastroesophageal reflux disease)   . OSA (obstructive sleep apnea) 01/17/2012  . CHF (congestive heart failure)   . Dysrhythmia   . Hyperthyroidism   . Anxiety   . Chronic kidney disease     /13  .  Hemorrhoid   . Arthritis   . Complication of anesthesia     DIFFICULT WAKING- 2011  . Papillary thyroid carcinoma    Past Surgical History  Procedure Laterality Date  . Cholecystectomy    . Hemorrhoid surgery    . Tubal ligation    . Shoulder surgery  06/2009  . Tonsillectomy and adenoidectomy  CHILD  . Intrauterine device insertion  2011  . Thyroidectomy  04/10/2012    Procedure: THYROIDECTOMY;  Surgeon: Jerrell Belfast, MD;  Location: [facility] OR;  Service: ENT;  Laterality: N/A;  Total Thyroidectomy   Family History  Problem Relation Age of Onset  . Cancer Father     colon cancer  . Arthritis Father   . Arthritis Mother   . Asthma Mother   . Hypertension Mother    History  Substance Use Topics  . Smoking status: Former Smoker -- 0.20 packs/day for 5 years    Quit date: 03/25/2004  . Smokeless tobacco: Never Used  . Alcohol Use: No   OB History    No data available     Review of Systems A complete 10 system review of systems was obtained and all systems are negative except as noted in the HPI and PMH.     Allergies  Tape; Buspirone hcl; Glipizide; Ivp dye; and Oxycodone-acetaminophen  Home Medications  Prior to Admission medications   Medication Sig Start Date End Date Taking? Authorizing Provider  albuterol (PROAIR HFA) 108 (90 BASE) MCG/ACT inhaler Inhale 2 puffs into the lungs every 4 (four) hours as needed. For wheezing 02/13/12   Tammy S Parrett, NP  amoxicillin (AMOXIL) 500 MG capsule Take 1 capsule (500 mg total) by mouth 3 (three) times daily. 03/01/14   [name], MD  B-D ULTRAFINE III SHORT PEN 31G X 8 MM MISC USE 1 DAILY DX CODE 250.0 12/20/13   [name], MD  buPROPion (WELLBUTRIN XL) 150 MG 24 hr tablet TAKE 1 TABLET BY MOUTH DAILY    [name], MD  Canagliflozin (INVOKANA) 100 MG TABS Take 100 mg daily in am 01/13/14   Philemon Kingdom, MD  carvedilol (COREG) 3.125 MG tablet TAKE 1 TABLET BY MOUTH TWICE A DAY WITH A MEAL 12/20/13   [name], MD  cetirizine (ZYRTEC) 10 MG tablet Take 10 mg by mouth daily.    Historical Provider, MD  ferrous sulfate 325 (65 FE) MG tablet Take 325 mg by mouth 2 (two) times daily.      Historical Provider, MD  fluticasone (FLONASE) 50 MCG/ACT nasal spray Place 2 sprays into the nose daily as needed. For allergies    Historical Provider, MD  glucose blood (ONE TOUCH ULTRA TEST) test strip Use 2x a day 02/14/14   Philemon Kingdom, MD  Insulin Glargine (TOUJEO SOLOSTAR) 300 UNIT/ML SOPN Inject 80 Units into the skin at bedtime. 02/14/14   Philemon Kingdom, MD  levothyroxine (SYNTHROID, LEVOTHROID) 175 MCG tablet TAKE 1 TABLET BY MOUTH DAILY BEFORE BREAKFAST. 02/15/14   Philemon Kingdom, MD  metFORMIN (GLUCOPHAGE) 1000 MG tablet Take 1 tablet (1,000 mg total) by mouth 2 (two) times daily with a meal. 12/27/13   [name], MD  pantoprazole (PROTONIX) 40 MG tablet TAKE 1 TABLET BY MOUTH DAILY 12/27/13   [name], MD  potassium chloride 20 MEQ TBCR Take 20 mEq by mouth 2 (two) times a week. Take with torsemide 02/03/13   Amy D Clegg, NP  sertraline (ZOLOFT) 100 MG tablet TAKE 1 TABLET (100 MG TOTAL) BY MOUTH DAILY. 12/27/13   [name], MD  simvastatin (ZOCOR) 20 MG tablet TAKE 1 TABLET BY MOUTH EVERY EVENING 12/27/13   [name], MD  spironolactone (ALDACTONE) 25 MG tablet TAKE 1 TABLET BY MOUTH EVERY DAY 10/25/13   Jolaine Artist, MD  torsemide (DEMADEX) 20 MG tablet Take 20 mg by mouth 2 (two) times a week. Wed and Sat 02/03/13   Amy D Ninfa Meeker, NP  TRAVATAN Z 0.004 % SOLN ophthalmic solution  11/21/12   Historical Provider, MD  zolpidem (AMBIEN) 10 MG tablet TAKE 1/2 TO 1 TABLET BY MOUTH ATBEDTIME AS NEEDED FOR SLEEP 12/20/13   [name], MD   Triage Vitals: BP 143/74 mmHg  Pulse 91  Temp(Src) 98.5 F (36.9 C) (Oral)  Resp 26  Ht 5\' 5"  (1.651 m)  Wt 284 lb (128.822 kg)  BMI 47.26 kg/m2  SpO2 96% Physical Exam  Constitutional: She is oriented to person, place, and time. She  appears well-developed and well-nourished. No distress.  HENT:  Head: Normocephalic and atraumatic.  Eyes: Conjunctivae and EOM are normal.  Neck: Neck supple. No tracheal deviation present.  Cardiovascular: Normal rate and regular rhythm.   Pulmonary/Chest: Effort normal and breath sounds normal. No respiratory distress.  Musculoskeletal: Normal range of motion.  Neurological: She is alert  and oriented to person, place, and time.  Skin: Skin is warm and dry.  Small pustule on right, medial shin with surrounding cellulitis.   Psychiatric: She has a normal mood and affect. Her behavior is normal.  Nursing note and vitals reviewed.   ED Course  Procedures (including critical care time) DIAGNOSTIC STUDIES: Oxygen Saturation is 96% on RA, nl by my interpretation.    COORDINATION OF CARE: 2:19 AM-Discussed treatment plan which includes meds and admitted to hospital with pt at bedside and pt agreed to plan.   Labs Review Labs Reviewed  CBC WITH DIFFERENTIAL - Abnormal; Notable for the following:    WBC 15.3 (*)    Neutrophils Relative % 91 (*)    Neutro Abs 13.9 (*)    Lymphocytes Relative 4 (*)    Lymphs Abs 0.6 (*)    All other components within normal limits  COMPREHENSIVE METABOLIC PANEL - Abnormal; Notable for the following:    Sodium 134 (*)    Glucose, Bld 186 (*)    All other components within normal limits  CBG MONITORING, ED - Abnormal; Notable for the following:    Glucose-Capillary 167 (*)    All other components within normal limits  LIPASE, BLOOD  URINALYSIS, ROUTINE W REFLEX MICROSCOPIC  I-STAT TROPOININ, ED  POC URINE PREG, ED    Imaging Review No results found.   EKG Interpretation None      MDM   Final diagnoses:  Cellulitis of right lower extremity    Pt with hx of DM who has cellulitis present in the RLE with fever, N/V/D and headache today.  Pt has been unable to tolerate po's today.  No prior hx of cellulitis and no recent hospitalizations.     Pt with small pustule without palpable abscess or fluctuance.  Small pustule drained but large area on the lower leg of cellulitis with fever of 101.6 here.  Due to pt being unable to tolerate po's will give IV clinda and admit to obs.  I personally performed the services described in this documentation, which was scribed in my presence.  The recorded information has been reviewed and considered.    Blanchie Dessert, MD 03/22/14 [phone]

## 2014-03-22 NOTE — H&P (Signed)
[facility] History and Physical  [name] NAT:[id number] DOB: 09-Apr-1965 DOA: 03/22/2014  Referring physician: ED physician PCP: Loura Pardon, MD  Specialists:   Chief Complaint: right leg redness and pain, fever, nausea, vomiting and diarrhea  HPI: [name] is a 48 y.o. female past medical history of asthma, diabetes mellitus, depression, hypertension, hyperlipidemia, GERD, diastolic congestive heart failure, hypothyroidism secondary to thyroidectomy because of thyroid cancer, who presents with right leg pain, fever, nausea, vomiting and diarrhea.  patient reports that at about 9:30 in the morning, she started having nausea, vomiting and diarrhea. She vomited 4 times without blood in the vomitus, but each time she vomited she had a little nose bleeding. She is not taking blood thinner at home. He ahs had 2 watery diarrhea today. No abdominal pain. She has fever and chills. She is not sure whether she took antibiotics recently. But on the Epic record, she seems to have taken amoxicillin on 03/01/14.  The patient also reports having left leg swelling, redness and tenderness over her chin area. There is a small boil over the shin area.  she has an enlarged and tender lymph node in the right groin area.   Work up in the ED demonstrates Leukocytosis with WBC 15.3. Negative troponin. Patient is admitted to inpatient for further evaluation and treatment.  Review of Systems: As presented in the history of presenting illness, rest negative.  Where does patient live?  At home Can patient participate in ADLs? Yes  Allergy:  Allergies  Allergen Reactions  . Tape Rash    Plastic tape  . Buspirone Hcl Nausea Only  . Glipizide     REACTION: sleepy, low sugar  . Ivp Dye [Iodinated Diagnostic Agents]     Shut kidneys down  . Oxycodone-Acetaminophen Itching    Past Medical History  Diagnosis Date  . Anemia     iron deficiency  . Asthma     a. PFTs showing possible mild  AFL 11/2010 but most likely no evidence of asthma.  . Depression     a. Stress reaction 08/2011 in multiple social stressors.  . Diabetes mellitus   . Hypertension   . Morbid obesity   . Hyperlipidemia   . Vertigo   . PCOS (polycystic ovarian syndrome)   . Gastritis   . Insomnia   . GERD (gastroesophageal reflux disease)   . OSA (obstructive sleep apnea) 01/17/2012  . CHF (congestive heart failure)   . Dysrhythmia   . Hyperthyroidism   . Anxiety   . Chronic kidney disease     /13  . Hemorrhoid   . Arthritis   . Complication of anesthesia     DIFFICULT WAKING- 2011  . Papillary thyroid carcinoma     Past Surgical History  Procedure Laterality Date  . Cholecystectomy    . Hemorrhoid surgery    . Tubal ligation    . Shoulder surgery  06/2009  . Tonsillectomy and adenoidectomy  CHILD  . Intrauterine device insertion  2011  . Thyroidectomy  04/10/2012    Procedure: THYROIDECTOMY;  Surgeon: Jerrell Belfast, MD;  Location: [facility];  Service: ENT;  Laterality: N/A;  Total Thyroidectomy    Social History:  reports that she quit smoking about 9 years ago. She has never used smokeless tobacco. She reports that she does not drink alcohol or use illicit drugs.  Family History:  Family History  Problem Relation Age of Onset  . Cancer Father     colon cancer  .  Arthritis Father   . Arthritis Mother   . Asthma Mother   . Hypertension Mother      Prior to Admission medications   Medication Sig Start Date End Date Taking? Authorizing Provider  albuterol (PROAIR HFA) 108 (90 BASE) MCG/ACT inhaler Inhale 2 puffs into the lungs every 4 (four) hours as needed. For wheezing 02/13/12   Tammy S Parrett, NP  amoxicillin (AMOXIL) 500 MG capsule Take 1 capsule (500 mg total) by mouth 3 (three) times daily. 03/01/14   [name], MD  B-D ULTRAFINE III SHORT PEN 31G X 8 MM MISC USE 1 DAILY DX CODE 250.0 12/20/13   [name], MD  buPROPion (WELLBUTRIN XL) 150 MG 24 hr tablet TAKE 1 TABLET BY  MOUTH DAILY    [name], MD  Canagliflozin (INVOKANA) 100 MG TABS Take 100 mg daily in am 01/13/14   Philemon Kingdom, MD  carvedilol (COREG) 3.125 MG tablet TAKE 1 TABLET BY MOUTH TWICE A DAY WITH A MEAL 12/20/13   [name], MD  cetirizine (ZYRTEC) 10 MG tablet Take 10 mg by mouth daily.    Historical Provider, MD  ferrous sulfate 325 (65 FE) MG tablet Take 325 mg by mouth 2 (two) times daily.      Historical Provider, MD  fluticasone (FLONASE) 50 MCG/ACT nasal spray Place 2 sprays into the nose daily as needed. For allergies    Historical Provider, MD  glucose blood (ONE TOUCH ULTRA TEST) test strip Use 2x a day 02/14/14   Philemon Kingdom, MD  Insulin Glargine (TOUJEO SOLOSTAR) 300 UNIT/ML SOPN Inject 80 Units into the skin at bedtime. 02/14/14   Philemon Kingdom, MD  levothyroxine (SYNTHROID, LEVOTHROID) 175 MCG tablet TAKE 1 TABLET BY MOUTH DAILY BEFORE BREAKFAST. 02/15/14   Philemon Kingdom, MD  metFORMIN (GLUCOPHAGE) 1000 MG tablet Take 1 tablet (1,000 mg total) by mouth 2 (two) times daily with a meal. 12/27/13   [name], MD  pantoprazole (PROTONIX) 40 MG tablet TAKE 1 TABLET BY MOUTH DAILY 12/27/13   [name], MD  potassium chloride 20 MEQ TBCR Take 20 mEq by mouth 2 (two) times a week. Take with torsemide 02/03/13   Amy D Clegg, NP  sertraline (ZOLOFT) 100 MG tablet TAKE 1 TABLET (100 MG TOTAL) BY MOUTH DAILY. 12/27/13   [name], MD  simvastatin (ZOCOR) 20 MG tablet TAKE 1 TABLET BY MOUTH EVERY EVENING 12/27/13   [name], MD  spironolactone (ALDACTONE) 25 MG tablet TAKE 1 TABLET BY MOUTH EVERY DAY 10/25/13   Jolaine Artist, MD  torsemide (DEMADEX) 20 MG tablet Take 20 mg by mouth 2 (two) times a week. Wed and Sat 02/03/13   Amy D Ninfa Meeker, NP  TRAVATAN Z 0.004 % SOLN ophthalmic solution  11/21/12   Historical Provider, MD  zolpidem (AMBIEN) 10 MG tablet TAKE 1/2 TO 1 TABLET BY MOUTH ATBEDTIME AS NEEDED FOR SLEEP 12/20/13   [name], MD    Physical  Exam: Filed Vitals:   03/21/14 2314 03/22/14 0155 03/22/14 0223  BP: 149/78 143/74   Pulse: 95 91   Temp: 98.5 F (36.9 C)  101.6 F (38.7 C)  TempSrc: Oral  Oral  Resp: 18 26   Height: 5\' 5"  (1.651 m)    Weight: 128.822 kg (284 lb)    SpO2: 96% 96%    General: Not in acute distress HEENT:       Eyes: PERRL, EOMI, no scleral icterus  ENT: No discharge from the ears and nose, no pharynx injection, no tonsillar enlargement.        Neck: No JVD, no bruit, no mass felt. Cardiac: S1/S2, RRR, No murmurs, No gallops or rubs Pulm: Good air movement bilaterally. Clear to auscultation bilaterally. No rales, wheezing, rhonchi or rubs. Abd: Soft, nondistended, nontender, no rebound pain, no organomegaly, BS present Ext: No edema bilaterally. 2+DP/PT pulse bilaterally Musculoskeletal: No joint deformities, erythema, or stiffness, ROM full Skin: there is redness, tenderness over her right shin area.there is small boil over the shin area.  Neuro: Alert and oriented X3, cranial nerves II-XII grossly intact, muscle strength 5/5 in all extremeties, sensation to light touch intact. Brachial reflex 2+ bilaterally. Knee reflex 1+ bilaterally.  Psych: Patient is not psychotic, no suicidal or hemocidal ideation.  Labs on Admission:  Basic Metabolic Panel:  Recent Labs Lab 03/21/14 2317  NA 134*  K 3.6  CL 98  CO2 21  GLUCOSE 186*  BUN 9  CREATININE 0.79  CALCIUM 9.6   Liver Function Tests:  Recent Labs Lab 03/21/14 2317  AST 17  ALT 19  ALKPHOS 69  BILITOT 0.8  PROT 7.8  ALBUMIN 4.1    Recent Labs Lab 03/21/14 2317  LIPASE 20   No results for input(s): AMMONIA in the last 168 hours. CBC:  Recent Labs Lab 03/21/14 2317  WBC 15.3*  NEUTROABS 13.9*  HGB 13.1  HCT 39.8  MCV 83.1  PLT 256   Cardiac Enzymes: No results for input(s): CKTOTAL, CKMB, CKMBINDEX, TROPONINI in the last 168 hours.  BNP (last 3 results) No results for input(s): PROBNP in the last 8760  hours. CBG:  Recent Labs Lab 03/21/14 2323  GLUCAP 167*    Radiological Exams on Admission: No results found.  EKG: Independently reviewed.   Assessment/Plan Principal Problem:   Cellulitis Active Problems:   Diabetes type 2, uncontrolled   HYPERLIPIDEMIA   Depression   Essential hypertension   Asthma   GERD   Chronic diastolic heart failure   Papillary thyroid carcinoma   Postsurgical hypothyroidism   Sepsis  R LE Cellulitis and sepsis: Patient has cellulitis over right lower leg. Currently patient has mild sepsis, with leukocytosis and fever, but hemodynamically stable. -will admit to tele bed given hx of CHF and sepsis - IV Vanc and zosyn cover cellulitis  - hold diuretics (furosemide and spironolactone) - blood cultrue x 2  diastolic congestive heart failure: 2-D echo on 10/12/13 showed EF 55-60%. Patient is on torsemide and spironolactone at home. She is taking torsemide 20 mg twice a week. Currently patient is euvolemic. - will hold diuretics - Check BNP  DM-II: Last A1c 9.7 on 12/20/13. On metformin,invokana and Lantus at home. -Decrease her Lantus dose from 80 units to 60 units because of poor oral intake -SSI  Asthma: stable. No signs of acute exacerbation. 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 Patient has not been followed up with oncologist. She did not have chemotherapy or radiation therapy. She is currently taking Synthroid. Last TSH was 2.96 on 02/14/14. -Continue Synthroid -Check TSH.   HTN: -Continue Coreg    Hyperlipidemia: -Continue Zocor  GERD: -Continue Protonix  DVT ppx: SQ Heparin        Code Status: Full code Family Communication:  Yes, patient's  sister     at bed side Disposition Plan: Admit to inpatient   Date of Service 03/22/2014    Shameria Trimarco,  [facility] Pager [phone]  If 7PM-7AM, please contact  night-coverage [email or web address] Password [facility] 03/22/2014, 3:37 AM

## 2014-03-22 NOTE — Progress Notes (Signed)
Patient Demographics  [name], is a 48 y.o. female, DOB - 1965-10-02, TYO:[id number]  Admit date - 03/22/2014   Admitting Physician Ivor Costa, MD  Outpatient Primary MD for the patient is Loura Pardon, MD  LOS - 0   Chief Complaint  Patient presents with  . Palpitations    The patient has been having diarrhea, nausea, vomiting and abdominal pain since 0900hrs.  She also has been having palpitations earlier today.  . Nausea  . Emesis      Admission history of present illness/brief narrative: [name] is a 48 y.o. female past medical history of asthma, diabetes mellitus, depression, hypertension, hyperlipidemia, GERD, diastolic congestive heart failure, hypothyroidism secondary to thyroidectomy because of thyroid cancer,  presents with right leg pain, fever, nausea, vomiting and diarrhea.  No abdominal pain. She has fever and chills. She is not sure whether she took antibiotics recently. But on the Epic record, she seems to have taken amoxicillin on 03/01/14.  The patient also reports having left leg swelling, redness and tenderness over her chin area. There is a small boil over the shin area. she has an enlarged and tender lymph node in the right groin area.   Work up in the ED demonstrates Leukocytosis with WBC 15.3. Negative troponin. Patient is admitted for IV antibiotics to treat cellulitis, she was started on IV vancomycin and Zosyn given her risk factors which includes diabetes and obesity.   Subjective:   [name] today has, No headache, No chest pain, No abdominal pain - No Nausea, No new weakness tingling or numbness, No Cough - SOB.   Assessment & Plan    Principal Problem:   Cellulitis Active Problems:   Diabetes type 2, uncontrolled   HYPERLIPIDEMIA   Depression   Essential hypertension   Asthma   GERD   Chronic  diastolic heart failure   Papillary thyroid carcinoma   Postsurgical hypothyroidism   Sepsis  R LE Cellulitis and sepsis: Patient has cellulitis over right lower leg. Currently patient has mild sepsis, with leukocytosis and fever, but hemodynamically stable. -will admit to tele bed given hx of CHF and sepsis - IV Vanc and zosyn cover cellulitis  - hold diuretics (furosemide and spironolactone) - blood cultrue x 2  diastolic congestive heart failure: 2-D echo on 10/12/13 showed EF 55-60%. Patient is on torsemide and spironolactone at home. She is taking torsemide 20 mg twice a week. Currently patient is euvolemic. - will hold diuretics - Check BNP  DM-II: Last A1c 9.7 on 12/20/13. On metformin,invokana and Lantus at home. -Decrease her Lantus dose from 80 units to 60 units because of poor oral intake -SSI  Asthma: stable. No signs of acute exacerbation. 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 Patient has not been followed up with oncologist. She did not have chemotherapy or radiation therapy. She is currently taking Synthroid. Last TSH was 2.96 on 02/14/14. -Continue Synthroid -Check TSH.   HTN: -Continue Coreg   Hyperlipidemia: -Continue Zocor  GERD: -Continue Protonix  Code Status: Full  Family Communication:   Disposition Plan: Remains on telemetry   Procedures  None   Consults   None   Medications  Scheduled Meds: .  buPROPion  150 mg Oral Daily  . carvedilol  3.125 mg Oral BID WC  . ferrous sulfate  325 mg Oral BID WC  . fluticasone  2 spray Each Nare Daily  . heparin  5,000 Units Subcutaneous 3 times per day  . insulin glargine  60 Units Subcutaneous QHS  . latanoprost  1 drop Both Eyes QHS  . levothyroxine  175 mcg Oral QAC breakfast  . loratadine  10 mg Oral Daily  . pantoprazole  40 mg Oral Daily  . sertraline  100 mg Oral Daily  . simvastatin  20 mg Oral q1800    . sodium chloride  3 mL Intravenous Q12H   Continuous Infusions: . sodium chloride 75 mL/hr at 03/22/14 1410  . piperacillin-tazobactam (ZOSYN)  IV Stopped (03/22/14 1034)  . vancomycin 1,000 mg (03/22/14 1411)   PRN Meds:.acetaminophen, albuterol, ondansetron, zolpidem  DVT Prophylaxis   Heparin -   Lab Results  Component Value Date   PLT 217 03/22/2014    Antibiotics    Anti-infectives    Start     Dose/Rate Route Frequency Ordered Stop   03/22/14 1400  vancomycin (VANCOCIN) IVPB 1000 mg/200 mL premix     1,000 mg200 mL/hr over 60 Minutes Intravenous Every 8 hours 03/22/14 0621     03/22/14 0700  vancomycin (VANCOCIN) 2,000 mg in sodium chloride 0.9 % 500 mL IVPB     2,000 mg250 mL/hr over 120 Minutes Intravenous  Once 03/22/14 0621 03/22/14 0922   03/22/14 0630  piperacillin-tazobactam (ZOSYN) IVPB 3.375 g     3.375 g12.5 mL/hr over 240 Minutes Intravenous 3 times per day 03/22/14 0621     03/22/14 0230  clindamycin (CLEOCIN) IVPB 600 mg     600 mg100 mL/hr over 30 Minutes Intravenous  Once 03/22/14 0219 03/22/14 0352          Objective:   Filed Vitals:   03/22/14 1244 03/22/14 1300 03/22/14 1330 03/22/14 1400  BP: 120/57 128/50 128/54 134/59  Pulse: 85 81 85 80  Temp:      TempSrc:      Resp: 18 19 21 22   Height:      Weight:      SpO2: 96% 96% 98% 95%    Wt Readings from Last 3 Encounters:  03/21/14 128.822 kg (284 lb)  03/01/14 126.327 kg (278 lb 8 oz)  02/14/14 124.739 kg (275 lb)    No intake or output data in the 24 hours ending 03/22/14 1437   Physical Exam  Awake Alert, Oriented X 3, No new F.N deficits, Normal affect [place].AT,PERRAL Supple Neck,No JVD, No cervical lymphadenopathy appriciated.  Symmetrical Chest wall movement, Good air movement bilaterally, CTAB RRR,No Gallops,Rubs or new Murmurs, No Parasternal Heave +ve B.Sounds, Abd Soft, No tenderness, No organomegaly appriciated, No rebound - guarding or rigidity. No Cyanosis, right  lower extremity erythema and warmth.   Data Review   Micro Results No results found for this or any previous visit (from the past 240 hour(s)).  Radiology Reports No results found.  CBC  Recent Labs Lab 03/21/14 2317 03/22/14 0800  WBC 15.3* 11.0*  HGB 13.1 11.8*  HCT 39.8 36.5  PLT 256 217  MCV 83.1 84.5  MCH 27.3 27.3  MCHC 32.9 32.3  RDW 14.8 15.0  LYMPHSABS 0.6*  --   MONOABS 0.7  --   EOSABS 0.0  --   BASOSABS 0.0  --     Chemistries   Recent Labs Lab 03/21/14 2317 03/22/14 0800  NA 134* 136  K 3.6 3.2*  CL 98 99  CO2 21 28  GLUCOSE 186* 136*  BUN 9 8  CREATININE 0.79 0.75  CALCIUM 9.6 9.2  AST 17  --   ALT 19  --   ALKPHOS 69  --   BILITOT 0.8  --    ------------------------------------------------------------------------------------------------------------------ estimated creatinine clearance is 116.3 mL/min (by C-G formula based on Cr of 0.75). ------------------------------------------------------------------------------------------------------------------ No results for input(s): HGBA1C in the last 72 hours. ------------------------------------------------------------------------------------------------------------------ No results for input(s): CHOL, HDL, LDLCALC, TRIG, CHOLHDL, LDLDIRECT in the last 72 hours. ------------------------------------------------------------------------------------------------------------------ No results for input(s): TSH, T4TOTAL, T3FREE, THYROIDAB in the last 72 hours.  Invalid input(s): FREET3 ------------------------------------------------------------------------------------------------------------------ No results for input(s): VITAMINB12, FOLATE, FERRITIN, TIBC, IRON, RETICCTPCT in the last 72 hours.  Coagulation profile  Recent Labs Lab 03/22/14 0800  INR 1.24    No results for input(s): DDIMER in the last 72 hours.  Cardiac Enzymes No results for input(s): CKMB, TROPONINI, MYOGLOBIN in the last  168 hours.  Invalid input(s): CK ------------------------------------------------------------------------------------------------------------------ Invalid input(s): POCBNP     Time Spent in minutes   25 minutes   Danny Zimny M.D on 03/22/2014 at 2:37 PM  Between 7am to 7pm - Pager - [phone]  After 7pm go to [email or web address] - password [email or web address]  And look for the night coverage person covering for me after hours  [facility] Office  [phone]   **Disclaimer: This note may have been dictated with voice recognition software. Similar sounding words can inadvertently be transcribed and this note may contain transcription errors which may not have been corrected upon publication of note.**

## 2014-03-23 ENCOUNTER — Encounter (HOSPITAL_COMMUNITY): Payer: Self-pay | Admitting: General Practice

## 2014-03-23 DIAGNOSIS — J4520 Mild intermittent asthma, uncomplicated: Secondary | ICD-10-CM

## 2014-03-23 LAB — BASIC METABOLIC PANEL
Anion gap: 6 (ref 5–15)
BUN: 6 mg/dL (ref 6–23)
CALCIUM: 8.5 mg/dL (ref 8.4–10.5)
CO2: 30 mmol/L (ref 19–32)
CREATININE: 0.7 mg/dL (ref 0.50–1.10)
Chloride: 99 mEq/L (ref 96–112)
GFR calc Af Amer: >90 mL/min (ref >90)
GFR calc non Af Amer: >90 mL/min (ref >90)
GLUCOSE: 160 mg/dL — AB (ref 70–99)
Potassium: 3.6 mmol/L (ref 3.5–5.1)
SODIUM: 135 mmol/L (ref 135–145)

## 2014-03-23 LAB — CBC
HEMATOCRIT: 35.3 % — AB (ref 36.0–46.0)
HEMOGLOBIN: 11 g/dL — AB (ref 12.0–15.0)
MCH: 26.7 pg (ref 26.0–34.0)
MCHC: 31.2 g/dL (ref 30.0–36.0)
MCV: 85.7 fL (ref 78.0–100.0)
Platelets: 195 10*3/uL (ref 150–400)
RBC: 4.12 MIL/uL (ref 3.87–5.11)
RDW: 15.3 % (ref 11.5–15.5)
WBC: 9.6 10*3/uL (ref 4.0–10.5)

## 2014-03-23 LAB — GLUCOSE, CAPILLARY
GLUCOSE-CAPILLARY: 187 mg/dL — AB (ref 70–99)
GLUCOSE-CAPILLARY: 173 mg/dL — AB (ref 70–99)
Glucose-Capillary: 159 mg/dL — ABNORMAL HIGH (ref 70–99)

## 2014-03-23 MED ORDER — INSULIN ASPART 100 UNIT/ML ~~LOC~~ SOLN
0.0000 [IU] | Freq: Three times a day (TID) | SUBCUTANEOUS | Status: DC
  Administered 2014-03-24 (×2): 2 [IU] via SUBCUTANEOUS
  Administered 2014-03-24: 3 [IU] via SUBCUTANEOUS
  Administered 2014-03-25 (×2): 2 [IU] via SUBCUTANEOUS
  Administered 2014-03-25: 3 [IU] via SUBCUTANEOUS
  Administered 2014-03-26: 2 [IU] via SUBCUTANEOUS
  Administered 2014-03-26 – 2014-03-27 (×3): 3 [IU] via SUBCUTANEOUS
  Administered 2014-03-27: 5 [IU] via SUBCUTANEOUS
  Administered 2014-03-27 – 2014-03-28 (×2): 2 [IU] via SUBCUTANEOUS
  Administered 2014-03-28: 3 [IU] via SUBCUTANEOUS
  Administered 2014-03-29 (×2): 2 [IU] via SUBCUTANEOUS

## 2014-03-23 MED ORDER — TRAMADOL HCL 50 MG PO TABS: 50.0000 mg | ORAL_TABLET | Freq: Four times a day (QID) | ORAL | Status: DC | PRN

## 2014-03-23 MED ORDER — INSULIN GLARGINE 100 UNIT/ML ~~LOC~~ SOLN
70.0000 [IU] | Freq: Every day | SUBCUTANEOUS | Status: DC
  Administered 2014-03-23 – 2014-03-27 (×5): 70 [IU] via SUBCUTANEOUS
  Filled 2014-03-23 (×8): qty 0.7

## 2014-03-23 MED ORDER — INSULIN ASPART 100 UNIT/ML ~~LOC~~ SOLN
0.0000 [IU] | Freq: Every day | SUBCUTANEOUS | Status: DC
  Administered 2014-03-28: 2 [IU] via SUBCUTANEOUS

## 2014-03-23 NOTE — Progress Notes (Signed)
Paged Dr. Eliseo Squires for pain medication. Orders given. Monitoring will continue.

## 2014-03-23 NOTE — Progress Notes (Signed)
Patient Demographics  [name], is a 48 y.o. female, DOB - 09/20/1965, JZP:[id number]  Admit date - 03/22/2014   Admitting Physician Ivor Costa, MD  Outpatient Primary MD for the patient is Loura Pardon, MD  LOS - 1   Chief Complaint  Patient presents with  . Palpitations    The patient has been having diarrhea, nausea, vomiting and abdominal pain since 0900hrs.  She also has been having palpitations earlier today.  . Nausea  . Emesis      Admission history of present illness/brief narrative: [name] is a 48 y.o. female past medical history of asthma, diabetes mellitus, depression, hypertension, hyperlipidemia, GERD, diastolic congestive heart failure, hypothyroidism secondary to thyroidectomy because of thyroid cancer,  presents with right leg pain, fever, nausea, vomiting and diarrhea.  No abdominal pain. She has fever and chills. She is not sure whether she took antibiotics recently. But on the Epic record, she seems to have taken amoxicillin on 03/01/14.  The patient also reports having left leg swelling, redness and tenderness over her chin area. There is a small boil over the shin area. she has an enlarged and tender lymph node in the right groin area.   Work up in the ED demonstrates Leukocytosis with WBC 15.3. Negative troponin. Patient is admitted for IV antibiotics to treat cellulitis, she was started on IV vancomycin and Zosyn given her risk factors which includes diabetes and obesity.   Subjective:   [name] is feeling better, no SOB, no CP   Assessment & Plan    Principal Problem:   Cellulitis Active Problems:   Diabetes type 2, uncontrolled   HYPERLIPIDEMIA   Depression   Essential hypertension   Asthma   GERD   Chronic diastolic heart failure   Papillary thyroid carcinoma   Postsurgical hypothyroidism    Sepsis  R LE Cellulitis and sepsis:  -right lower leg.  - IV Vanc and zosyn to cover cellulitis  - blood cultrue x 2 -area marked with pen -WBC improved  diastolic congestive heart failure: 2-D echo on 10/12/13 showed EF 55-60%. Patient is on torsemide and spironolactone at home. She is taking torsemide 20 mg twice a week. Currently patient is euvolemic. - will hold diuretics   DM-II: Last A1c 9.7 on 12/20/13. On metformin,invokana and Lantus at home. -Decrease her Lantus dose from 80 units to 60 units because of poor oral intake -SSI  Asthma: stable. No signs of acute exacerbation. 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 Patient has not been followed up with oncologist. She did not have chemotherapy or radiation therapy. She is currently taking Synthroid. Last TSH was 2.96 on 02/14/14. -Continue Synthroid -Check TSH.   HTN: -Continue Coreg   Hyperlipidemia: -Continue Zocor  GERD: -Continue Protonix  Code Status: Full  Family Communication:   Disposition Plan: Remains on telemetry   Procedures  None   Consults   None   Medications  Scheduled Meds: . buPROPion  150 mg Oral Daily  . carvedilol  3.125 mg Oral BID WC  . ferrous sulfate  325 mg Oral BID WC  . fluticasone  2 spray Each Nare Daily  . heparin  5,000 Units Subcutaneous  3 times per day  . insulin glargine  60 Units Subcutaneous QHS  . latanoprost  1 drop Both Eyes QHS  . levothyroxine  175 mcg Oral QAC breakfast  . loratadine  10 mg Oral Daily  . pantoprazole  40 mg Oral Daily  . piperacillin-tazobactam (ZOSYN)  IV  3.375 g Intravenous 3 times per day  . sertraline  100 mg Oral Daily  . simvastatin  20 mg Oral q1800  . sodium chloride  3 mL Intravenous Q12H  . vancomycin  1,000 mg Intravenous Q8H   Continuous Infusions:   PRN Meds:.acetaminophen, albuterol, ondansetron, zolpidem  DVT Prophylaxis   Heparin  -   Lab Results  Component Value Date   PLT 195 03/23/2014    Antibiotics    Anti-infectives    Start     Dose/Rate Route Frequency Ordered Stop   03/22/14 1400  vancomycin (VANCOCIN) IVPB 1000 mg/200 mL premix     1,000 mg200 mL/hr over 60 Minutes Intravenous Every 8 hours 03/22/14 0621     03/22/14 0700  vancomycin (VANCOCIN) 2,000 mg in sodium chloride 0.9 % 500 mL IVPB     2,000 mg250 mL/hr over 120 Minutes Intravenous  Once 03/22/14 0621 03/22/14 0922   03/22/14 0630  piperacillin-tazobactam (ZOSYN) IVPB 3.375 g     3.375 g12.5 mL/hr over 240 Minutes Intravenous 3 times per day 03/22/14 0621     03/22/14 0230  clindamycin (CLEOCIN) IVPB 600 mg     600 mg100 mL/hr over 30 Minutes Intravenous  Once 03/22/14 0219 03/22/14 0352          Objective:   Filed Vitals:   03/22/14 1600 03/22/14 1629 03/22/14 1958 03/23/14 0559  BP: 150/78 144/77 149/72 146/80  Pulse: 92 84 80 81  Temp:  99.6 F (37.6 C) 99.6 F (37.6 C) 98.8 F (37.1 C)  TempSrc:  Oral Oral Oral  Resp: 18  18 18   Height:      Weight:    122.244 kg (269 lb 8 oz)  SpO2: 96% 94% 95% 96%    Wt Readings from Last 3 Encounters:  03/23/14 122.244 kg (269 lb 8 oz)  03/01/14 126.327 kg (278 lb 8 oz)  02/14/14 124.739 kg (275 lb)     Intake/Output Summary (Last 24 hours) at 03/23/14 0813 Last data filed at 03/22/14 2257  Gross per 24 hour  Intake    300 ml  Output   1100 ml  Net   -800 ml     Physical Exam  Awake Alert, Oriented X 3, No new F.N deficits, Normal affect  Symmetrical Chest wall movement, Good air movement bilaterally, CTAB RRR,No Gallops,Rubs or new Murmurs, No Parasternal Heave +ve B.Sounds, obese No Cyanosis, right lower extremity erythema and warmth, tender to palpation   Data Review   Micro Results No results found for this or any previous visit (from the past 240 hour(s)).  Radiology Reports No results found.  CBC  Recent Labs Lab 03/21/14 2317 03/22/14 0800  03/23/14 0545  WBC 15.3* 11.0* 9.6  HGB 13.1 11.8* 11.0*  HCT 39.8 36.5 35.3*  PLT 256 217 195  MCV 83.1 84.5 85.7  MCH 27.3 27.3 26.7  MCHC 32.9 32.3 31.2  RDW 14.8 15.0 15.3  LYMPHSABS 0.6*  --   --   MONOABS 0.7  --   --   EOSABS 0.0  --   --   BASOSABS 0.0  --   --     Chemistries   Recent  Labs Lab 03/21/14 2317 03/22/14 0800 03/23/14 0545  NA 134* 136 135  K 3.6 3.2* 3.6  CL 98 99 99  CO2 21 28 30   GLUCOSE 186* 136* 160*  BUN 9 8 6   CREATININE 0.79 0.75 0.70  CALCIUM 9.6 9.2 8.5  AST 17  --   --   ALT 19  --   --   ALKPHOS 69  --   --   BILITOT 0.8  --   --    ------------------------------------------------------------------------------------------------------------------ estimated creatinine clearance is 112.8 mL/min (by C-G formula based on Cr of 0.7). ------------------------------------------------------------------------------------------------------------------ No results for input(s): HGBA1C in the last 72 hours. ------------------------------------------------------------------------------------------------------------------ No results for input(s): CHOL, HDL, LDLCALC, TRIG, CHOLHDL, LDLDIRECT in the last 72 hours. ------------------------------------------------------------------------------------------------------------------ No results for input(s): TSH, T4TOTAL, T3FREE, THYROIDAB in the last 72 hours.  Invalid input(s): FREET3 ------------------------------------------------------------------------------------------------------------------ No results for input(s): VITAMINB12, FOLATE, FERRITIN, TIBC, IRON, RETICCTPCT in the last 72 hours.  Coagulation profile  Recent Labs Lab 03/22/14 0800  INR 1.24    No results for input(s): DDIMER in the last 72 hours.  Cardiac Enzymes No results for input(s): CKMB, TROPONINI, MYOGLOBIN in the last 168 hours.  Invalid input(s):  CK ------------------------------------------------------------------------------------------------------------------ Invalid input(s): POCBNP     Time Spent in minutes   25 minutes   Meagen Limones DO on 03/23/2014 at 8:13 AM  Between 7am to 7pm - Pager - [phone]  After 7pm go to [email or web address] - password [email or web address]  And look for the night coverage person covering for me after hours  [facility] Office  [phone]

## 2014-03-23 NOTE — Progress Notes (Signed)
Utilization review completed.  

## 2014-03-23 NOTE — Progress Notes (Signed)
Patient has brought in her CPAP from home and will place herself on when ready.

## 2014-03-24 LAB — TSH: TSH: 8.756 u[IU]/mL — AB (ref 0.350–4.500)

## 2014-03-24 LAB — CBC
HEMATOCRIT: 34.2 % — AB (ref 36.0–46.0)
Hemoglobin: 10.6 g/dL — ABNORMAL LOW (ref 12.0–15.0)
MCH: 26.4 pg (ref 26.0–34.0)
MCHC: 31 g/dL (ref 30.0–36.0)
MCV: 85.3 fL (ref 78.0–100.0)
Platelets: 188 10*3/uL (ref 150–400)
RBC: 4.01 MIL/uL (ref 3.87–5.11)
RDW: 14.9 % (ref 11.5–15.5)
WBC: 8.6 10*3/uL (ref 4.0–10.5)

## 2014-03-24 LAB — BASIC METABOLIC PANEL
Anion gap: 7 (ref 5–15)
BUN: 6 mg/dL (ref 6–23)
CO2: 28 mmol/L (ref 19–32)
Calcium: 8.6 mg/dL (ref 8.4–10.5)
Chloride: 100 mEq/L (ref 96–112)
Creatinine, Ser: 0.64 mg/dL (ref 0.50–1.10)
GFR calc Af Amer: >90 mL/min (ref >90)
GFR calc non Af Amer: >90 mL/min (ref >90)
Glucose, Bld: 126 mg/dL — ABNORMAL HIGH (ref 70–99)
Potassium: 3 mmol/L — ABNORMAL LOW (ref 3.5–5.1)
SODIUM: 135 mmol/L (ref 135–145)

## 2014-03-24 LAB — VANCOMYCIN, TROUGH: VANCOMYCIN TR: 10.9 ug/mL (ref 10.0–20.0)

## 2014-03-24 LAB — GLUCOSE, CAPILLARY
Glucose-Capillary: 135 mg/dL — ABNORMAL HIGH (ref 70–99)
Glucose-Capillary: 173 mg/dL — ABNORMAL HIGH (ref 70–99)
Glucose-Capillary: 195 mg/dL — ABNORMAL HIGH (ref 70–99)
Glucose-Capillary: 141 mg/dL — ABNORMAL HIGH (ref 70–99)

## 2014-03-24 LAB — MAGNESIUM: MAGNESIUM: 1.9 mg/dL (ref 1.5–2.5)

## 2014-03-24 MED ORDER — POTASSIUM CHLORIDE 20 MEQ/15ML (10%) PO SOLN
40.0000 meq | Freq: Once | ORAL | Status: AC
  Administered 2014-03-24: 40 meq via ORAL
  Filled 2014-03-24: qty 30

## 2014-03-24 MED ORDER — POTASSIUM CHLORIDE CRYS ER 20 MEQ PO TBCR
40.0000 meq | EXTENDED_RELEASE_TABLET | Freq: Once | ORAL | Status: AC
  Administered 2014-03-24: 40 meq via ORAL
  Filled 2014-03-24: qty 2

## 2014-03-24 NOTE — Progress Notes (Signed)
Patient Demographics  [name], is a 48 y.o. female, DOB - 01-25-1966, WEX:[id number]  Admit date - 03/22/2014   Admitting Physician Ivor Costa, MD  Outpatient Primary MD for the patient is Loura Pardon, MD  LOS - 2   Chief Complaint  Patient presents with  . Palpitations    The patient has been having diarrhea, nausea, vomiting and abdominal pain since 0900hrs.  She also has been having palpitations earlier today.  . Nausea  . Emesis      Admission history of present illness/brief narrative: [name] is a 48 y.o. female past medical history of asthma, diabetes mellitus, depression, hypertension, hyperlipidemia, GERD, diastolic congestive heart failure, hypothyroidism secondary to thyroidectomy because of thyroid cancer,  presents with right leg pain, fever, nausea, vomiting and diarrhea.  No abdominal pain. She has fever and chills. She is not sure whether she took antibiotics recently. But on the Epic record, she seems to have taken amoxicillin on 03/01/14.  The patient also reports having left leg swelling, redness and tenderness over her chin area. There is a small boil over the shin area. she has an enlarged and tender lymph node in the right groin area.   Work up in the ED demonstrates Leukocytosis with WBC 15.3. Negative troponin. Patient is admitted for IV antibiotics to treat cellulitis, she was started on IV vancomycin and Zosyn given her risk factors which includes diabetes and obesity.   Subjective:   [name] is still tender but redness in her legs have decreased  Assessment & Plan    Principal Problem:   Cellulitis Active Problems:   Diabetes type 2, uncontrolled   HYPERLIPIDEMIA   Depression   Essential hypertension   Asthma   GERD   Chronic diastolic heart failure   Papillary thyroid carcinoma   Postsurgical  hypothyroidism   Sepsis  R LE Cellulitis and sepsis:  -right lower leg.  - IV Vanc and zosyn to cover cellulitis  - blood culture x 2 -area marked with pen -WBC improved  diastolic congestive heart failure: 2-D echo on 10/12/13 showed EF 55-60%. Patient is on torsemide and spironolactone at home. She is taking torsemide 20 mg twice a week. Currently patient is euvolemic. - will hold diuretics  DM-II: Last A1c 9.7 on 12/20/13. On metformin,invokana and Lantus at home. -Lantus  -SSI  Asthma: stable. No signs of acute exacerbation. 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 Patient has not been followed up with oncologist. She did not have chemotherapy or radiation therapy. She is currently taking Synthroid. Last TSH was 2.96 on 02/14/14. -Continue Synthroid -TSH elevated, check free t4  HTN: -Continue Coreg   Hyperlipidemia: -Continue Zocor  GERD: -Continue Protonix  Code Status: Full  Family Communication:   Disposition Plan: Remains on telemetry   Procedures  None   Consults   None   Medications  Scheduled Meds: . buPROPion  150 mg Oral Daily  . carvedilol  3.125 mg Oral BID WC  . ferrous sulfate  325 mg Oral BID WC  . fluticasone  2 spray Each Nare Daily  . heparin  5,000 Units Subcutaneous 3 times per day  .  insulin aspart  0-15 Units Subcutaneous TID WC  . insulin aspart  0-5 Units Subcutaneous QHS  . insulin glargine  70 Units Subcutaneous QHS  . latanoprost  1 drop Both Eyes QHS  . levothyroxine  175 mcg Oral QAC breakfast  . loratadine  10 mg Oral Daily  . pantoprazole  40 mg Oral Daily  . piperacillin-tazobactam (ZOSYN)  IV  3.375 g Intravenous 3 times per day  . potassium chloride  40 mEq Oral Once  . potassium chloride  40 mEq Oral Once  . sertraline  100 mg Oral Daily  . simvastatin  20 mg Oral q1800  . sodium chloride  3  mL Intravenous Q12H  . vancomycin  1,000 mg Intravenous Q8H   Continuous Infusions:   PRN Meds:.acetaminophen, albuterol, ondansetron, traMADol, zolpidem  DVT Prophylaxis   Heparin -   Lab Results  Component Value Date   PLT 188 03/24/2014    Antibiotics    Anti-infectives    Start     Dose/Rate Route Frequency Ordered Stop   03/22/14 1400  vancomycin (VANCOCIN) IVPB 1000 mg/200 mL premix     1,000 mg200 mL/hr over 60 Minutes Intravenous Every 8 hours 03/22/14 0621     03/22/14 0700  vancomycin (VANCOCIN) 2,000 mg in sodium chloride 0.9 % 500 mL IVPB     2,000 mg250 mL/hr over 120 Minutes Intravenous  Once 03/22/14 0621 03/22/14 0922   03/22/14 0630  piperacillin-tazobactam (ZOSYN) IVPB 3.375 g     3.375 g12.5 mL/hr over 240 Minutes Intravenous 3 times per day 03/22/14 0621     03/22/14 0230  clindamycin (CLEOCIN) IVPB 600 mg     600 mg100 mL/hr over 30 Minutes Intravenous  Once 03/22/14 0219 03/22/14 0352          Objective:   Filed Vitals:   03/23/14 0559 03/23/14 1341 03/23/14 1940 03/24/14 0558  BP: 146/80 140/75 138/67 125/53  Pulse: 81 73 70 75  Temp: 98.8 F (37.1 C) 99 F (37.2 C) 98.7 F (37.1 C) 98.7 F (37.1 C)  TempSrc: Oral Oral Oral Oral  Resp: 18 18 18 18   Height:      Weight: 122.244 kg (269 lb 8 oz)   122.108 kg (269 lb 3.2 oz)  SpO2: 96% 98% 96% 97%    Wt Readings from Last 3 Encounters:  03/24/14 122.108 kg (269 lb 3.2 oz)  03/01/14 126.327 kg (278 lb 8 oz)  02/14/14 124.739 kg (275 lb)     Intake/Output Summary (Last 24 hours) at 03/24/14 0847 Last data filed at 03/23/14 1300  Gross per 24 hour  Intake    480 ml  Output      0 ml  Net    480 ml     Physical Exam  Awake Alert, Oriented X 3, No new F.N deficits, Normal affect Symmetrical Chest wall movement, Good air movement bilaterally, CTAB RRR,No Gallops,Rubs or new Murmurs, No Parasternal Heave +ve B.Sounds, obese No Cyanosis, right lower extremity erythema and warmth,  tender to palpation- no focal area   Data Review   Micro Results Recent Results (from the past 240 hour(s))  Culture, blood (routine x 2)     Status: None (Preliminary result)   Collection Time: 03/22/14  8:00 AM  Result Value Ref Range Status   Specimen Description BLOOD LEFT ANTECUBITAL  Final   Special Requests BOTTLES DRAWN AEROBIC AND ANAEROBIC 10ML  Final   Culture   Final  BLOOD CULTURE RECEIVED NO GROWTH TO DATE CULTURE WILL BE HELD FOR 5 DAYS BEFORE ISSUING A FINAL NEGATIVE REPORT Performed at [facility]    Report Status PENDING  Incomplete  Culture, blood (routine x 2)     Status: None (Preliminary result)   Collection Time: 03/22/14  8:15 AM  Result Value Ref Range Status   Specimen Description BLOOD LEFT HAND  Final   Special Requests BOTTLES DRAWN AEROBIC AND ANAEROBIC 10ML  Final   Culture   Final           BLOOD CULTURE RECEIVED NO GROWTH TO DATE CULTURE WILL BE HELD FOR 5 DAYS BEFORE ISSUING A FINAL NEGATIVE REPORT Performed at [facility]    Report Status PENDING  Incomplete    Radiology Reports No results found.  CBC  Recent Labs Lab 03/21/14 2317 03/22/14 0800 03/23/14 0545 03/24/14 0325  WBC 15.3* 11.0* 9.6 8.6  HGB 13.1 11.8* 11.0* 10.6*  HCT 39.8 36.5 35.3* 34.2*  PLT 256 217 195 188  MCV 83.1 84.5 85.7 85.3  MCH 27.3 27.3 26.7 26.4  MCHC 32.9 32.3 31.2 31.0  RDW 14.8 15.0 15.3 14.9  LYMPHSABS 0.6*  --   --   --   MONOABS 0.7  --   --   --   EOSABS 0.0  --   --   --   BASOSABS 0.0  --   --   --     Chemistries   Recent Labs Lab 03/21/14 2317 03/22/14 0800 03/23/14 0545 03/24/14 0325  NA 134* 136 135 135  K 3.6 3.2* 3.6 3.0*  CL 98 99 99 100  CO2 21 28 30 28   GLUCOSE 186* 136* 160* 126*  BUN 9 8 6 6   CREATININE 0.79 0.75 0.70 0.64  CALCIUM 9.6 9.2 8.5 8.6  AST 17  --   --   --   ALT 19  --   --   --   ALKPHOS 69  --   --   --   BILITOT 0.8  --   --   --     ------------------------------------------------------------------------------------------------------------------ estimated creatinine clearance is 112.7 mL/min (by C-G formula based on Cr of 0.64). ------------------------------------------------------------------------------------------------------------------ No results for input(s): HGBA1C in the last 72 hours. ------------------------------------------------------------------------------------------------------------------ No results for input(s): CHOL, HDL, LDLCALC, TRIG, CHOLHDL, LDLDIRECT in the last 72 hours. ------------------------------------------------------------------------------------------------------------------  Recent Labs  03/24/14 0325  TSH 8.756*   ------------------------------------------------------------------------------------------------------------------ No results for input(s): VITAMINB12, FOLATE, FERRITIN, TIBC, IRON, RETICCTPCT in the last 72 hours.  Coagulation profile  Recent Labs Lab 03/22/14 0800  INR 1.24    No results for input(s): DDIMER in the last 72 hours.  Cardiac Enzymes No results for input(s): CKMB, TROPONINI, MYOGLOBIN in the last 168 hours.  Invalid input(s): CK ------------------------------------------------------------------------------------------------------------------ Invalid input(s): POCBNP     Time Spent in minutes   25 minutes   Sheralee Qazi DO on 03/24/2014 at 8:47 AM  Between 7am to 7pm - Pager - [phone]  After 7pm go to [email or web address] - password [email or web address]  And look for the night coverage person covering for me after hours  [facility] Office  [phone]

## 2014-03-24 NOTE — Progress Notes (Signed)
Pt. Has home cpap. Able to place on herself.

## 2014-03-24 NOTE — Progress Notes (Signed)
Pt potassium level this morning 3.0.  MD notified.  Will continue to monitor.  Claudette Stapler, RN

## 2014-03-24 NOTE — Progress Notes (Signed)
ANTIBIOTIC CONSULT NOTE - INITIAL  Pharmacy Consult for Vancomycin and Zosyn  Indication: cellulitis  Allergies  Allergen Reactions  . Tape Rash    Plastic tape  . Buspirone Hcl Nausea Only  . Glipizide     REACTION: sleepy, low sugar  . Ivp Dye [Iodinated Diagnostic Agents]     Shut kidneys down  . Oxycodone-Acetaminophen Itching    Patient Measurements: Height: 5\' 5"  (165.1 cm) Weight: 269 lb 3.2 oz (122.108 kg) IBW/kg (Calculated) : 57 Adjusted Body Weight: 85 kg  Vital Signs: Temp: 97.5 F (36.4 C) (12/31 2045) Temp Source: Oral (12/31 2045) BP: 151/74 mmHg (12/31 2045) Pulse Rate: 77 (12/31 2045) Intake/Output from previous day: 12/30 0701 - 12/31 0700 In: 480 [P.O.:480] Out: -  Intake/Output from this shift:    Labs:  Recent Labs  03/22/14 0800 03/23/14 0545 03/24/14 0325  WBC 11.0* 9.6 8.6  HGB 11.8* 11.0* 10.6*  PLT 217 195 188  CREATININE 0.75 0.70 0.64   Estimated Creatinine Clearance: 112.7 mL/min (by C-G formula based on Cr of 0.64).  Recent Labs  03/24/14 2126  [place] 10.9     Microbiology: Recent Results (from the past 720 hour(s))  Culture, blood (routine x 2)     Status: None (Preliminary result)   Collection Time: 03/22/14  8:00 AM  Result Value Ref Range Status   Specimen Description BLOOD LEFT ANTECUBITAL  Final   Special Requests BOTTLES DRAWN AEROBIC AND ANAEROBIC 10ML  Final   Culture   Final           BLOOD CULTURE RECEIVED NO GROWTH TO DATE CULTURE WILL BE HELD FOR 5 DAYS BEFORE ISSUING A FINAL NEGATIVE REPORT Performed at [facility]    Report Status PENDING  Incomplete  Culture, blood (routine x 2)     Status: None (Preliminary result)   Collection Time: 03/22/14  8:15 AM  Result Value Ref Range Status   Specimen Description BLOOD LEFT HAND  Final   Special Requests BOTTLES DRAWN AEROBIC AND ANAEROBIC 10ML  Final   Culture   Final           BLOOD CULTURE RECEIVED NO GROWTH TO DATE CULTURE WILL BE HELD  FOR 5 DAYS BEFORE ISSUING A FINAL NEGATIVE REPORT Performed at [facility]    Report Status PENDING  Incomplete    Medical History: Past Medical History  Diagnosis Date  . Anemia     iron deficiency  . Asthma     a. 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  . Depression     a. Stress reaction 08/2011 in multiple social stressors.  . Diabetes mellitus   . Hypertension   . Morbid obesity   . Hyperlipidemia   . Vertigo   . PCOS (polycystic ovarian syndrome)   . Gastritis   . Insomnia   . GERD (gastroesophageal reflux disease)   . OSA (obstructive sleep apnea) 01/17/2012  . CHF (congestive heart failure)   . Dysrhythmia   . Hyperthyroidism   . Anxiety   . Chronic kidney disease     /13  . Hemorrhoid   . Arthritis   . Complication of anesthesia     DIFFICULT WAKING- 2011  . Papillary thyroid carcinoma   . Cellulitis and abscess of leg 02/2014    Medications:  Albuterol  Wellbutrin  Invokana  Coreg  Zyrtec  Iron  Flonase  Toujeo  Synthroid  Metformin  Protonix  KCl  Zoloft  Zocor  Aldactone  Demadex  Travatan  Ambien  Assessment: 48 yo female with RLE cellulitis for empiric antibiotics. Vanc trough came back at 10.9 tonight which is therapeutic.  Goal of Therapy:  Vancomycin trough level 10-15 mcg/ml  Plan:   Vancomycin 1 g IV q8h Zosyn 3.375 g IV q8h   Onnie Boer, PharmD Pager: [phone] 03/24/2014 10:03 PM

## 2014-03-25 ENCOUNTER — Inpatient Hospital Stay (HOSPITAL_COMMUNITY): Payer: BLUE CROSS/BLUE SHIELD

## 2014-03-25 LAB — CBC
HEMATOCRIT: 34.9 % — AB (ref 36.0–46.0)
Hemoglobin: 10.7 g/dL — ABNORMAL LOW (ref 12.0–15.0)
MCH: 26.6 pg (ref 26.0–34.0)
MCHC: 30.7 g/dL (ref 30.0–36.0)
MCV: 86.6 fL (ref 78.0–100.0)
Platelets: 233 10*3/uL (ref 150–400)
RBC: 4.03 MIL/uL (ref 3.87–5.11)
RDW: 15 % (ref 11.5–15.5)
WBC: 7.4 10*3/uL (ref 4.0–10.5)

## 2014-03-25 LAB — GLUCOSE, CAPILLARY
GLUCOSE-CAPILLARY: 144 mg/dL — AB (ref 70–99)
GLUCOSE-CAPILLARY: 158 mg/dL — AB (ref 70–99)
Glucose-Capillary: 158 mg/dL — ABNORMAL HIGH (ref 70–99)
Glucose-Capillary: 135 mg/dL — ABNORMAL HIGH (ref 70–99)

## 2014-03-25 LAB — BASIC METABOLIC PANEL
Anion gap: 8 (ref 5–15)
BUN: 5 mg/dL — AB (ref 6–23)
CALCIUM: 9.1 mg/dL (ref 8.4–10.5)
CO2: 29 mmol/L (ref 19–32)
Chloride: 102 mEq/L (ref 96–112)
Creatinine, Ser: 0.64 mg/dL (ref 0.50–1.10)
GFR calc Af Amer: >90 mL/min (ref >90)
GFR calc non Af Amer: >90 mL/min (ref >90)
Glucose, Bld: 187 mg/dL — ABNORMAL HIGH (ref 70–99)
Potassium: 3.7 mmol/L (ref 3.5–5.1)
SODIUM: 139 mmol/L (ref 135–145)

## 2014-03-25 MED ORDER — CLOTRIMAZOLE 1 % VA CREA
1.0000 | TOPICAL_CREAM | Freq: Every day | VAGINAL | Status: DC
  Administered 2014-03-25 – 2014-03-28 (×4): 1 via VAGINAL
  Filled 2014-03-25: qty 45

## 2014-03-25 MED ORDER — CLOTRIMAZOLE 1 % VA CREA: 1.0000 | TOPICAL_CREAM | Freq: Every day | VAGINAL | Status: DC

## 2014-03-25 NOTE — Progress Notes (Signed)
Patient Demographics  [name], is a 49 y.o. female, DOB - Nov 23, 1965, QIH:[id number]  Admit date - 03/22/2014   Admitting Physician Ivor Costa, MD  Outpatient Primary MD for the patient is Loura Pardon, MD  LOS - 3   Chief Complaint  Patient presents with  . Palpitations    The patient has been having diarrhea, nausea, vomiting and abdominal pain since 0900hrs.  She also has been having palpitations earlier today.  . Nausea  . Emesis      Admission history of present illness/brief narrative: [name] is a 49 y.o. female past medical history of asthma, diabetes mellitus, depression, hypertension, hyperlipidemia, GERD, diastolic congestive heart failure, hypothyroidism secondary to thyroidectomy because of thyroid cancer,  presents with right leg pain, fever, nausea, vomiting and diarrhea.  No abdominal pain. She has fever and chills. She is not sure whether she took antibiotics recently. But on the Epic record, she seems to have taken amoxicillin on 03/01/14.  The patient also reports having left leg swelling, redness and tenderness over her chin area. There is a small boil over the shin area. she has an enlarged and tender lymph node in the right groin area.   Work up in the ED demonstrates Leukocytosis with WBC 15.3. Negative troponin. Patient is admitted for IV antibiotics to treat cellulitis, she was started on IV vancomycin and Zosyn given her risk factors which includes diabetes and obesity.   Subjective:   [name] is still tender and it appears the redness has extended past marking done initally  Assessment & Plan    Principal Problem:   Cellulitis Active Problems:   Diabetes type 2, uncontrolled   HYPERLIPIDEMIA   Depression   Essential hypertension   Asthma   GERD   Chronic diastolic heart failure   Papillary thyroid  carcinoma   Postsurgical hypothyroidism   Sepsis  R LE Cellulitis and sepsis:  -right lower leg.  - IV Vanc and zosyn to cover cellulitis  - blood culture x 2 -area marked with pen -WBC improved, no fever-chest right tib/fib for boney involvement negative, ? MRI -will consult with ID  diastolic congestive heart failure: 2-D echo on 10/12/13 showed EF 55-60%. Patient is on torsemide and spironolactone at home. She is taking torsemide 20 mg twice a week. Currently patient is euvolemic. - will hold diuretics  DM-II: Last A1c 9.7 on 12/20/13. On metformin,invokana and Lantus at home. -Lantus  -SSI  Asthma: stable. No signs of acute exacerbation. 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 Patient has not been followed up with oncologist. She did not have chemotherapy or radiation therapy. She is currently taking Synthroid. Last TSH was 2.96 on 02/14/14. -Continue Synthroid -TSH elevated, recheck outpatient  HTN: -Continue Coreg   Hyperlipidemia: -Continue Zocor  GERD: -Continue Protonix  Code Status: Full  Family Communication:   Disposition Plan: Remains on telemetry   Procedures  None   Consults   None   Medications  Scheduled Meds: . buPROPion  150 mg Oral Daily  . carvedilol  3.125 mg Oral BID WC  . ferrous sulfate  325 mg Oral BID WC  . fluticasone  2 spray Each Nare Daily  . heparin  5,000 Units Subcutaneous 3 times per day  . insulin aspart  0-15 Units Subcutaneous TID WC  . insulin aspart  0-5 Units Subcutaneous QHS  . insulin glargine  70 Units Subcutaneous QHS  . latanoprost  1 drop Both Eyes QHS  . levothyroxine  175 mcg Oral QAC breakfast  . loratadine  10 mg Oral Daily  . pantoprazole  40 mg Oral Daily  . piperacillin-tazobactam (ZOSYN)  IV  3.375 g Intravenous 3 times per day  . sertraline  100 mg Oral Daily  . simvastatin   20 mg Oral q1800  . sodium chloride  3 mL Intravenous Q12H  . vancomycin  1,000 mg Intravenous Q8H   Continuous Infusions:   PRN Meds:.acetaminophen, albuterol, ondansetron, traMADol, zolpidem  DVT Prophylaxis   Heparin -   Lab Results  Component Value Date   PLT 233 03/25/2014    Antibiotics    Anti-infectives    Start     Dose/Rate Route Frequency Ordered Stop   03/22/14 1400  vancomycin (VANCOCIN) IVPB 1000 mg/200 mL premix     1,000 mg200 mL/hr over 60 Minutes Intravenous Every 8 hours 03/22/14 0621     03/22/14 0700  vancomycin (VANCOCIN) 2,000 mg in sodium chloride 0.9 % 500 mL IVPB     2,000 mg250 mL/hr over 120 Minutes Intravenous  Once 03/22/14 0621 03/22/14 0922   03/22/14 0630  piperacillin-tazobactam (ZOSYN) IVPB 3.375 g     3.375 g12.5 mL/hr over 240 Minutes Intravenous 3 times per day 03/22/14 0621     03/22/14 0230  clindamycin (CLEOCIN) IVPB 600 mg     600 mg100 mL/hr over 30 Minutes Intravenous  Once 03/22/14 0219 03/22/14 0352          Objective:   Filed Vitals:   03/24/14 0558 03/24/14 1335 03/24/14 2045 03/25/14 0551  BP: 125/53 131/68 151/74 136/80  Pulse: 75 65 77 71  Temp: 98.7 F (37.1 C) 99.4 F (37.4 C) 97.5 F (36.4 C) 98.7 F (37.1 C)  TempSrc: Oral Oral Oral Oral  Resp: 18 20 18  81  Height:      Weight: 122.108 kg (269 lb 3.2 oz)   122.3 kg (269 lb 10 oz)  SpO2: 97% 94% 98% 98%    Wt Readings from Last 3 Encounters:  03/25/14 122.3 kg (269 lb 10 oz)  03/01/14 126.327 kg (278 lb 8 oz)  02/14/14 124.739 kg (275 lb)     Intake/Output Summary (Last 24 hours) at 03/25/14 0816 Last data filed at 03/24/14 2236  Gross per 24 hour  Intake   1200 ml  Output    500 ml  Net    700 ml     Physical Exam  Awake Alert, Oriented X 3, No new F.N deficits, Normal affect Symmetrical Chest wall movement, Good air movement bilaterally, CTAB RRR,No Gallops,Rubs or new Murmurs, No Parasternal Heave +ve B.Sounds, obese No Cyanosis, right  lower extremity erythema and warmth, tender to palpation- no focal area   Data Review   Micro Results Recent Results (from the past 240 hour(s))  Culture, blood (routine x 2)     Status: None (Preliminary result)   Collection Time: 03/22/14  8:00 AM  Result Value Ref Range Status   Specimen Description BLOOD LEFT ANTECUBITAL  Final   Special Requests BOTTLES DRAWN AEROBIC AND ANAEROBIC 10ML  Final   Culture   Final  BLOOD CULTURE RECEIVED NO GROWTH TO DATE CULTURE WILL BE HELD FOR 5 DAYS BEFORE ISSUING A FINAL NEGATIVE REPORT Performed at [facility]    Report Status PENDING  Incomplete  Culture, blood (routine x 2)     Status: None (Preliminary result)   Collection Time: 03/22/14  8:15 AM  Result Value Ref Range Status   Specimen Description BLOOD LEFT HAND  Final   Special Requests BOTTLES DRAWN AEROBIC AND ANAEROBIC 10ML  Final   Culture   Final           BLOOD CULTURE RECEIVED NO GROWTH TO DATE CULTURE WILL BE HELD FOR 5 DAYS BEFORE ISSUING A FINAL NEGATIVE REPORT Performed at [facility]    Report Status PENDING  Incomplete    Radiology Reports No results found.  CBC  Recent Labs Lab 03/21/14 2317 03/22/14 0800 03/23/14 0545 03/24/14 0325 03/25/14 0330  WBC 15.3* 11.0* 9.6 8.6 7.4  HGB 13.1 11.8* 11.0* 10.6* 10.7*  HCT 39.8 36.5 35.3* 34.2* 34.9*  PLT 256 217 195 188 233  MCV 83.1 84.5 85.7 85.3 86.6  MCH 27.3 27.3 26.7 26.4 26.6  MCHC 32.9 32.3 31.2 31.0 30.7  RDW 14.8 15.0 15.3 14.9 15.0  LYMPHSABS 0.6*  --   --   --   --   MONOABS 0.7  --   --   --   --   EOSABS 0.0  --   --   --   --   BASOSABS 0.0  --   --   --   --     Chemistries   Recent Labs Lab 03/21/14 2317 03/22/14 0800 03/23/14 0545 03/24/14 0325 03/25/14 0330  NA 134* 136 135 135 139  K 3.6 3.2* 3.6 3.0* 3.7  CL 98 99 99 100 102  CO2 21 28 30 28 29   GLUCOSE 186* 136* 160* 126* 187*  BUN 9 8 6 6  5*  CREATININE 0.79 0.75 0.70 0.64 0.64  CALCIUM 9.6 9.2  8.5 8.6 9.1  MG  --   --   --  1.9  --   AST 17  --   --   --   --   ALT 19  --   --   --   --   ALKPHOS 69  --   --   --   --   BILITOT 0.8  --   --   --   --    ------------------------------------------------------------------------------------------------------------------ estimated creatinine clearance is 112.8 mL/min (by C-G formula based on Cr of 0.64). ------------------------------------------------------------------------------------------------------------------ No results for input(s): HGBA1C in the last 72 hours. ------------------------------------------------------------------------------------------------------------------ No results for input(s): CHOL, HDL, LDLCALC, TRIG, CHOLHDL, LDLDIRECT in the last 72 hours. ------------------------------------------------------------------------------------------------------------------  Recent Labs  03/24/14 0325  TSH 8.756*   ------------------------------------------------------------------------------------------------------------------ No results for input(s): VITAMINB12, FOLATE, FERRITIN, TIBC, IRON, RETICCTPCT in the last 72 hours.  Coagulation profile  Recent Labs Lab 03/22/14 0800  INR 1.24    No results for input(s): DDIMER in the last 72 hours.  Cardiac Enzymes No results for input(s): CKMB, TROPONINI, MYOGLOBIN in the last 168 hours.  Invalid input(s): CK ------------------------------------------------------------------------------------------------------------------ Invalid input(s): POCBNP     Time Spent in minutes   25 minutes   VANN, JESSICA DO on 03/25/2014 at 8:16 AM  Between 7am to 7pm - Pager - [phone]  After 7pm go to [email or web address] - password [email or web address]  And look for the night coverage person covering for me after hours  [facility] Office  [phone]    

## 2014-03-25 NOTE — Progress Notes (Signed)
2003 pt c/o vaginal burning and itching since starting antibiotics. Upon nurse assessment labia noted to be red. Notified Rogue Bussing, NP.   He ordered topical lotriban.   Will continue to monitor.   Earlie Lou

## 2014-03-26 LAB — GLUCOSE, CAPILLARY
GLUCOSE-CAPILLARY: 163 mg/dL — AB (ref 70–99)
Glucose-Capillary: 168 mg/dL — ABNORMAL HIGH (ref 70–99)
Glucose-Capillary: 197 mg/dL — ABNORMAL HIGH (ref 70–99)

## 2014-03-26 MED ORDER — SPIRONOLACTONE 25 MG PO TABS
25.0000 mg | ORAL_TABLET | Freq: Every day | ORAL | Status: DC
  Administered 2014-03-26 – 2014-03-29 (×4): 25 mg via ORAL
  Filled 2014-03-26 (×4): qty 1

## 2014-03-26 MED ORDER — CEFAZOLIN SODIUM 1-5 GM-% IV SOLN
1.0000 g | Freq: Three times a day (TID) | INTRAVENOUS | Status: DC
  Administered 2014-03-26 – 2014-03-28 (×6): 1 g via INTRAVENOUS
  Filled 2014-03-26 (×8): qty 50

## 2014-03-26 MED ORDER — TORSEMIDE 20 MG PO TABS
20.0000 mg | ORAL_TABLET | ORAL | Status: DC
  Administered 2014-03-28: 20 mg via ORAL
  Filled 2014-03-26 (×2): qty 1

## 2014-03-26 NOTE — Progress Notes (Signed)
Patient Demographics  [name], is a 49 y.o. female, DOB - [birth year], FBP:[id number]  Admit date - 03/22/2014   Admitting Physician Ivor Costa, MD  Outpatient Primary MD for the patient is Loura Pardon, MD  LOS - 4   Chief Complaint  Patient presents with  . Palpitations    The patient has been having diarrhea, nausea, vomiting and abdominal pain since 0900hrs.  She also has been having palpitations earlier today.  . Nausea  . Emesis      Admission history of present illness/brief narrative: [name] is a 49 y.o. female past medical history of asthma, diabetes mellitus, depression, hypertension, hyperlipidemia, GERD, diastolic congestive heart failure, hypothyroidism secondary to thyroidectomy because of thyroid cancer,  presents with right leg pain, fever, nausea, vomiting and diarrhea.  No abdominal pain. She has fever and chills. She is not sure whether she took antibiotics recently. But on the Epic record, she seems to have taken amoxicillin on 03/01/14.  The patient also reports having left leg swelling, redness and tenderness over her chin area. There is a small boil over the shin area. she has an enlarged and tender lymph node in the right groin area.   Work up in the ED demonstrates Leukocytosis with WBC 15.3. Negative troponin. Patient is admitted for IV antibiotics to treat cellulitis, she was started on IV vancomycin and Zosyn given her risk factors which includes diabetes and obesity.   Subjective:   Haeleigh Streiff is still tender  But redness and swelling has lessened  Assessment & Plan    Principal Problem:   Cellulitis Active Problems:   Diabetes type 2, uncontrolled   HYPERLIPIDEMIA   Depression   Essential hypertension   Asthma   GERD   Chronic diastolic heart failure   Papillary thyroid carcinoma   Postsurgical  hypothyroidism   Sepsis  R LE Cellulitis and sepsis:  -right lower leg.  - IV Vanc and ancef  - blood culture x 2 NGTD -area marked with pen -WBC improved, no fever-chest right tib/fib for boney involvement negative -will consult with ID who recommended change from zosyn to ancef---  No fever, no WBC count  diastolic congestive heart failure: 2-D echo on 10/12/13 showed EF 55-60%. Resume diuretics  DM-II: Last A1c 9.7 on 12/20/13. On metformin,invokana and Lantus at home. -Lantus  -SSI  Asthma: stable. No signs of acute exacerbation. 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 Patient has not been followed up with oncologist. She did not have chemotherapy or radiation therapy. She is currently taking Synthroid. Last TSH was 2.96 on 02/14/14. -Continue Synthroid -TSH elevated, recheck outpatient  HTN: -Continue Coreg   Hyperlipidemia: -Continue Zocor  GERD: -Continue Protonix  Code Status: Full  Family Communication:   Disposition Plan: 1-2 more days of IV abx   Procedures  None   Consults   None   Medications  Scheduled Meds: . buPROPion  150 mg Oral Daily  . carvedilol  3.125 mg Oral BID WC  .  ceFAZolin (ANCEF) IV  1 g Intravenous Q8H  . clotrimazole  1 Applicatorful Vaginal QHS  . ferrous sulfate  325 mg Oral BID WC  .  fluticasone  2 spray Each Nare Daily  . heparin  5,000 Units Subcutaneous 3 times per day  . insulin aspart  0-15 Units Subcutaneous TID WC  . insulin aspart  0-5 Units Subcutaneous QHS  . insulin glargine  70 Units Subcutaneous QHS  . latanoprost  1 drop Both Eyes QHS  . levothyroxine  175 mcg Oral QAC breakfast  . loratadine  10 mg Oral Daily  . pantoprazole  40 mg Oral Daily  . sertraline  100 mg Oral Daily  . simvastatin  20 mg Oral q1800  . sodium chloride  3 mL Intravenous Q12H  . vancomycin  1,000 mg  Intravenous Q8H   Continuous Infusions:   PRN Meds:.acetaminophen, albuterol, ondansetron, traMADol, zolpidem  DVT Prophylaxis   Heparin -   Lab Results  Component Value Date   PLT 233 03/25/2014    Antibiotics    Anti-infectives    Start     Dose/Rate Route Frequency Ordered Stop   03/26/14 1100  ceFAZolin (ANCEF) IVPB 1 g/50 mL premix     1 g100 mL/hr over 30 Minutes Intravenous Every 8 hours 03/26/14 0944     03/22/14 1400  vancomycin (VANCOCIN) IVPB 1000 mg/200 mL premix     1,000 mg200 mL/hr over 60 Minutes Intravenous Every 8 hours 03/22/14 0621     03/22/14 0700  vancomycin (VANCOCIN) 2,000 mg in sodium chloride 0.9 % 500 mL IVPB     2,000 mg250 mL/hr over 120 Minutes Intravenous  Once 03/22/14 0621 03/22/14 0922   03/22/14 0630  piperacillin-tazobactam (ZOSYN) IVPB 3.375 g  Status:  Discontinued     3.375 g12.5 mL/hr over 240 Minutes Intravenous 3 times per day 03/22/14 0621 03/25/14 1251   03/22/14 0230  clindamycin (CLEOCIN) IVPB 600 mg     600 mg100 mL/hr over 30 Minutes Intravenous  Once 03/22/14 0219 03/22/14 0352          Objective:   Filed Vitals:   03/25/14 0551 03/25/14 1523 03/25/14 2005 03/26/14 0513  BP: 136/80 134/76 156/75 154/79  Pulse: 71 66 63 80  Temp: 98.7 F (37.1 C) 99.2 F (37.3 C) 98.2 F (36.8 C) 97.7 F (36.5 C)  TempSrc: Oral Oral Oral Oral  Resp: 81 66 18 18  Height:      Weight: 122.3 kg (269 lb 10 oz)   122.925 kg (271 lb)  SpO2: 98% 97% 97% 96%    Wt Readings from Last 3 Encounters:  03/26/14 122.925 kg (271 lb)  03/01/14 126.327 kg (278 lb 8 oz)  02/14/14 124.739 kg (275 lb)     Intake/Output Summary (Last 24 hours) at 03/26/14 1010 Last data filed at 03/26/14 0847  Gross per 24 hour  Intake    240 ml  Output      0 ml  Net    240 ml     Physical Exam  Awake Alert, Oriented X 3, No new F.N deficits, Normal affect No Cyanosis, right lower extremity with decreased redness and swelling   Data Review   Micro  Results Recent Results (from the past 240 hour(s))  Culture, blood (routine x 2)     Status: None (Preliminary result)   Collection Time: 03/22/14  8:00 AM  Result Value Ref Range Status   Specimen Description BLOOD LEFT ANTECUBITAL  Final   Special Requests BOTTLES DRAWN AEROBIC AND ANAEROBIC 10ML  Final   Culture   Final           BLOOD CULTURE RECEIVED  NO GROWTH TO DATE CULTURE WILL BE HELD FOR 5 DAYS BEFORE ISSUING A FINAL NEGATIVE REPORT Performed at [facility]    Report Status PENDING  Incomplete  Culture, blood (routine x 2)     Status: None (Preliminary result)   Collection Time: 03/22/14  8:15 AM  Result Value Ref Range Status   Specimen Description BLOOD LEFT HAND  Final   Special Requests BOTTLES DRAWN AEROBIC AND ANAEROBIC 10ML  Final   Culture   Final           BLOOD CULTURE RECEIVED NO GROWTH TO DATE CULTURE WILL BE HELD FOR 5 DAYS BEFORE ISSUING A FINAL NEGATIVE REPORT Performed at [facility]    Report Status PENDING  Incomplete    Radiology Reports Dg Tibia/fibula Right  03/25/2014   CLINICAL DATA:  5 days ago she seen a spot show up on her right tib/fib. Since that day, she has had increased swelling and redness to the right tib/fib. Patient states she has had no injury to the right tib/fib. Patient has a hx of diabetes. Patient states that pain radiates superiorly to right hip. Patient states within the past 5 days, she has had a fever periodically.  EXAM: RIGHT TIBIA AND FIBULA - 2 VIEW  COMPARISON:  None.  FINDINGS: Negative for fracture or dislocation. Normal mineralization and alignment. Small marginal spurs from the patella, medial femoral condyle and medial margin tibial plateau. Corticated ossicle projects posterior to the talus. Calcaneal spurs at the insertion of the Achilles tendon and plantar aponeurosis.  IMPRESSION: 1. Negative for fracture or other acute abnormality. 2. Degenerative changes as above.   Electronically Signed   By: Arne Cleveland M.D.   On: 03/25/2014 09:12    CBC  Recent Labs Lab 03/21/14 2317 03/22/14 0800 03/23/14 0545 03/24/14 0325 03/25/14 0330  WBC 15.3* 11.0* 9.6 8.6 7.4  HGB 13.1 11.8* 11.0* 10.6* 10.7*  HCT 39.8 36.5 35.3* 34.2* 34.9*  PLT 256 217 195 188 233  MCV 83.1 84.5 85.7 85.3 86.6  MCH 27.3 27.3 26.7 26.4 26.6  MCHC 32.9 32.3 31.2 31.0 30.7  RDW 14.8 15.0 15.3 14.9 15.0  LYMPHSABS 0.6*  --   --   --   --   MONOABS 0.7  --   --   --   --   EOSABS 0.0  --   --   --   --   BASOSABS 0.0  --   --   --   --     Chemistries   Recent Labs Lab 03/21/14 2317 03/22/14 0800 03/23/14 0545 03/24/14 0325 03/25/14 0330  NA 134* 136 135 135 139  K 3.6 3.2* 3.6 3.0* 3.7  CL 98 99 99 100 102  CO2 21 28 30 28 29   GLUCOSE 186* 136* 160* 126* 187*  BUN 9 8 6 6  5*  CREATININE 0.79 0.75 0.70 0.64 0.64  CALCIUM 9.6 9.2 8.5 8.6 9.1  MG  --   --   --  1.9  --   AST 17  --   --   --   --   ALT 19  --   --   --   --   ALKPHOS 69  --   --   --   --   BILITOT 0.8  --   --   --   --    ------------------------------------------------------------------------------------------------------------------ estimated creatinine clearance is 113.2 mL/min (by C-G formula based on Cr of 0.64). ------------------------------------------------------------------------------------------------------------------  No results for input(s): HGBA1C in the last 72 hours. ------------------------------------------------------------------------------------------------------------------ No results for input(s): CHOL, HDL, LDLCALC, TRIG, CHOLHDL, LDLDIRECT in the last 72 hours. ------------------------------------------------------------------------------------------------------------------  Recent Labs  03/24/14 0325  TSH 8.756*   ------------------------------------------------------------------------------------------------------------------ No results for input(s): VITAMINB12, FOLATE, FERRITIN, TIBC, IRON,  RETICCTPCT in the last 72 hours.  Coagulation profile  Recent Labs Lab 03/22/14 0800  INR 1.24    No results for input(s): DDIMER in the last 72 hours.  Cardiac Enzymes No results for input(s): CKMB, TROPONINI, MYOGLOBIN in the last 168 hours.  Invalid input(s): CK ------------------------------------------------------------------------------------------------------------------ Invalid input(s): POCBNP     Time Spent in minutes   15 minutes   Phylicia Mcgaugh DO on 03/26/2014 at 10:10 AM  Between 7am to 7pm - Pager - [phone]  After 7pm go to [email or web address] - password [email or web address]  And look for the night coverage person covering for me after hours  [facility] Office  [phone]

## 2014-03-26 NOTE — Progress Notes (Signed)
Patient places self on CPAP. 

## 2014-03-27 LAB — GLUCOSE, CAPILLARY
GLUCOSE-CAPILLARY: 191 mg/dL — AB (ref 70–99)
Glucose-Capillary: 135 mg/dL — ABNORMAL HIGH (ref 70–99)
Glucose-Capillary: 148 mg/dL — ABNORMAL HIGH (ref 70–99)
Glucose-Capillary: 177 mg/dL — ABNORMAL HIGH (ref 70–99)
Glucose-Capillary: 214 mg/dL — ABNORMAL HIGH (ref 70–99)

## 2014-03-27 LAB — CBC
HCT: 32.5 % — ABNORMAL LOW (ref 36.0–46.0)
Hemoglobin: 10.2 g/dL — ABNORMAL LOW (ref 12.0–15.0)
MCH: 26.8 pg (ref 26.0–34.0)
MCHC: 31.4 g/dL (ref 30.0–36.0)
MCV: 85.3 fL (ref 78.0–100.0)
PLATELETS: 266 10*3/uL (ref 150–400)
RBC: 3.81 MIL/uL — ABNORMAL LOW (ref 3.87–5.11)
RDW: 14.8 % (ref 11.5–15.5)
WBC: 9.2 10*3/uL (ref 4.0–10.5)

## 2014-03-27 LAB — BASIC METABOLIC PANEL
ANION GAP: 5 (ref 5–15)
BUN: 7 mg/dL (ref 6–23)
CALCIUM: 9 mg/dL (ref 8.4–10.5)
CHLORIDE: 101 meq/L (ref 96–112)
CO2: 32 mmol/L (ref 19–32)
Creatinine, Ser: 0.77 mg/dL (ref 0.50–1.10)
GFR calc Af Amer: >90 mL/min (ref >90)
GFR calc non Af Amer: >90 mL/min (ref >90)
Glucose, Bld: 177 mg/dL — ABNORMAL HIGH (ref 70–99)
Potassium: 3.3 mmol/L — ABNORMAL LOW (ref 3.5–5.1)
Sodium: 138 mmol/L (ref 135–145)

## 2014-03-27 LAB — T4, FREE: Free T4: 1.1 ng/dL (ref 0.80–1.80)

## 2014-03-27 MED ORDER — POTASSIUM CHLORIDE CRYS ER 20 MEQ PO TBCR
40.0000 meq | EXTENDED_RELEASE_TABLET | Freq: Once | ORAL | Status: AC
  Administered 2014-03-27: 40 meq via ORAL
  Filled 2014-03-27: qty 2

## 2014-03-27 NOTE — Progress Notes (Signed)
Pt stated she can place herself on CPAP at night, RT informed pt if she needed help to call for RT.

## 2014-03-27 NOTE — Progress Notes (Addendum)
Patient Demographics  [name], is a 49 y.o. female, DOB - 1965-11-01, CHE:[id number]  Admit date - 03/22/2014   Admitting Physician Ivor Costa, MD  Outpatient Primary MD for the patient is Loura Pardon, MD  LOS - 5   Chief Complaint  Patient presents with  . Palpitations    The patient has been having diarrhea, nausea, vomiting and abdominal pain since 0900hrs.  She also has been having palpitations earlier today.  . Nausea  . Emesis      Admission history of present illness/brief narrative: [name] is a 49 y.o. female past medical history of asthma, diabetes mellitus, depression, hypertension, hyperlipidemia, GERD, diastolic congestive heart failure, hypothyroidism secondary to thyroidectomy because of thyroid cancer,  presents with right leg pain, fever, nausea, vomiting and diarrhea.  No abdominal pain. She has fever and chills. She is not sure whether she took antibiotics recently. But on the Epic record, she seems to have taken amoxicillin on 03/01/14.  The patient also reports having left leg swelling, redness and tenderness over her chin area. There is a small boil over the shin area. she has an enlarged and tender lymph node in the right groin area.   Work up in the ED demonstrates Leukocytosis with WBC 15.3. Negative troponin. Patient is admitted for IV antibiotics to treat cellulitis, she was started on IV vancomycin and Zosyn given her risk factors which includes diabetes and obesity. She has been slow to heal due to the above issues as well as anxiety    Subjective:   [name] is still tender  But redness and swelling has lessened and pain improved Patient pleased with recovery so far  Assessment & Plan    Principal Problem:   Cellulitis Active Problems:   Diabetes type 2, uncontrolled   HYPERLIPIDEMIA   Depression    Essential hypertension   Asthma   GERD   Chronic diastolic heart failure   Papillary thyroid carcinoma   Postsurgical hypothyroidism   Sepsis  R LE Cellulitis and sepsis:  -right lower leg.  - IV Vanc and ancef  - blood culture x 2 NGTD -area marked with pen -WBC improved, no fever-check right tib/fib for boney involvement: negative -/ if some of the redness is now chronic color change -hope for d/c on Po abx Monday/Tuesday  diastolic congestive heart failure: 2-D echo on 10/12/13 showed EF 55-60%. Resume diuretics  DM-II: Last A1c 9.7 on 12/20/13. On metformin,invokana and Lantus at home. -Lantus  -SSI  Asthma: stable. No signs of acute exacerbation. 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 Patient has not been followed up with oncologist. She did not have chemotherapy or radiation therapy. She is currently taking Synthroid. Last TSH was 2.96 on 02/14/14. -Continue Synthroid -TSH elevated, recheck outpatient  HTN: -Continue Coreg   Hyperlipidemia: -Continue Zocor  GERD: -Continue Protonix  Code Status: Full  Family Communication:   Disposition Plan: 1-2 more days of IV abx   Procedures  None   Consults   None   Medications  Scheduled Meds: . buPROPion  150 mg Oral Daily  . carvedilol  3.125 mg Oral BID WC  .  ceFAZolin (ANCEF) IV  1 g Intravenous Q8H  . clotrimazole  1 Applicatorful Vaginal QHS  . ferrous sulfate  325 mg Oral BID WC  . fluticasone  2 spray Each Nare Daily  . heparin  5,000 Units Subcutaneous 3 times per day  . insulin aspart  0-15 Units Subcutaneous TID WC  . insulin aspart  0-5 Units Subcutaneous QHS  . insulin glargine  70 Units Subcutaneous QHS  . latanoprost  1 drop Both Eyes QHS  . levothyroxine  175 mcg Oral QAC breakfast  . loratadine  10 mg Oral Daily  . pantoprazole  40 mg Oral Daily  . potassium  chloride  40 mEq Oral Once  . sertraline  100 mg Oral Daily  . simvastatin  20 mg Oral q1800  . sodium chloride  3 mL Intravenous Q12H  . spironolactone  25 mg Oral Daily  . [START ON 03/28/2014] torsemide  20 mg Oral Once per day on Mon Thu  . vancomycin  1,000 mg Intravenous Q8H   Continuous Infusions:   PRN Meds:.acetaminophen, albuterol, ondansetron, traMADol, zolpidem  DVT Prophylaxis   Heparin -   Lab Results  Component Value Date   PLT 266 03/27/2014    Antibiotics    Anti-infectives    Start     Dose/Rate Route Frequency Ordered Stop   03/26/14 1100  ceFAZolin (ANCEF) IVPB 1 g/50 mL premix     1 g100 mL/hr over 30 Minutes Intravenous Every 8 hours 03/26/14 0944     03/22/14 1400  vancomycin (VANCOCIN) IVPB 1000 mg/200 mL premix     1,000 mg200 mL/hr over 60 Minutes Intravenous Every 8 hours 03/22/14 0621     03/22/14 0700  vancomycin (VANCOCIN) 2,000 mg in sodium chloride 0.9 % 500 mL IVPB     2,000 mg250 mL/hr over 120 Minutes Intravenous  Once 03/22/14 0621 03/22/14 0922   03/22/14 0630  piperacillin-tazobactam (ZOSYN) IVPB 3.375 g  Status:  Discontinued     3.375 g12.5 mL/hr over 240 Minutes Intravenous 3 times per day 03/22/14 0621 03/25/14 1251   03/22/14 0230  clindamycin (CLEOCIN) IVPB 600 mg     600 mg100 mL/hr over 30 Minutes Intravenous  Once 03/22/14 0219 03/22/14 0352          Objective:   Filed Vitals:   03/26/14 1345 03/26/14 2025 03/27/14 0413 03/27/14 0625  BP: 156/77 153/78 158/76 148/72  Pulse: 65 66 66 60  Temp: 97.7 F (36.5 C) 98.8 F (37.1 C) 98.6 F (37 C) 98.7 F (37.1 C)  TempSrc: Oral Oral Oral Oral  Resp: 20 18  18   Height:      Weight:    121.1 kg (266 lb 15.6 oz)  SpO2: 98% 97% 95% 96%    Wt Readings from Last 3 Encounters:  03/27/14 121.1 kg (266 lb 15.6 oz)  03/01/14 126.327 kg (278 lb 8 oz)  02/14/14 124.739 kg (275 lb)     Intake/Output Summary (Last 24 hours) at 03/27/14 0851 Last data filed at 03/27/14 6644   Gross per 24 hour  Intake    720 ml  Output   3300 ml  Net  -2580 ml     Physical Exam  Awake Alert, Oriented X 3, No new F.N deficits, Normal affect Lungs clear No Cyanosis, right lower extremity with decreased redness and swelling   Data Review   Micro Results Recent Results (from the past 240 hour(s))  Culture, blood (routine x 2)     Status:  None (Preliminary result)   Collection Time: 03/22/14  8:00 AM  Result Value Ref Range Status   Specimen Description BLOOD LEFT ANTECUBITAL  Final   Special Requests BOTTLES DRAWN AEROBIC AND ANAEROBIC 10ML  Final   Culture   Final           BLOOD CULTURE RECEIVED NO GROWTH TO DATE CULTURE WILL BE HELD FOR 5 DAYS BEFORE ISSUING A FINAL NEGATIVE REPORT Performed at [facility]    Report Status PENDING  Incomplete  Culture, blood (routine x 2)     Status: None (Preliminary result)   Collection Time: 03/22/14  8:15 AM  Result Value Ref Range Status   Specimen Description BLOOD LEFT HAND  Final   Special Requests BOTTLES DRAWN AEROBIC AND ANAEROBIC 10ML  Final   Culture   Final           BLOOD CULTURE RECEIVED NO GROWTH TO DATE CULTURE WILL BE HELD FOR 5 DAYS BEFORE ISSUING A FINAL NEGATIVE REPORT Performed at [facility]    Report Status PENDING  Incomplete    Radiology Reports Dg Tibia/fibula Right  03/25/2014   CLINICAL DATA:  5 days ago she seen a spot show up on her right tib/fib. Since that day, she has had increased swelling and redness to the right tib/fib. Patient states she has had no injury to the right tib/fib. Patient has a hx of diabetes. Patient states that pain radiates superiorly to right hip. Patient states within the past 5 days, she has had a fever periodically.  EXAM: RIGHT TIBIA AND FIBULA - 2 VIEW  COMPARISON:  None.  FINDINGS: Negative for fracture or dislocation. Normal mineralization and alignment. Small marginal spurs from the patella, medial femoral condyle and medial margin tibial plateau.  Corticated ossicle projects posterior to the talus. Calcaneal spurs at the insertion of the Achilles tendon and plantar aponeurosis.  IMPRESSION: 1. Negative for fracture or other acute abnormality. 2. Degenerative changes as above.   Electronically Signed   By: Arne Cleveland M.D.   On: 03/25/2014 09:12    CBC  Recent Labs Lab 03/21/14 2317 03/22/14 0800 03/23/14 0545 03/24/14 0325 03/25/14 0330 03/27/14 0330  WBC 15.3* 11.0* 9.6 8.6 7.4 9.2  HGB 13.1 11.8* 11.0* 10.6* 10.7* 10.2*  HCT 39.8 36.5 35.3* 34.2* 34.9* 32.5*  PLT 256 217 195 188 233 266  MCV 83.1 84.5 85.7 85.3 86.6 85.3  MCH 27.3 27.3 26.7 26.4 26.6 26.8  MCHC 32.9 32.3 31.2 31.0 30.7 31.4  RDW 14.8 15.0 15.3 14.9 15.0 14.8  LYMPHSABS 0.6*  --   --   --   --   --   MONOABS 0.7  --   --   --   --   --   EOSABS 0.0  --   --   --   --   --   BASOSABS 0.0  --   --   --   --   --     Chemistries   Recent Labs Lab 03/21/14 2317 03/22/14 0800 03/23/14 0545 03/24/14 0325 03/25/14 0330 03/27/14 0330  NA 134* 136 135 135 139 138  K 3.6 3.2* 3.6 3.0* 3.7 3.3*  CL 98 99 99 100 102 101  CO2 21 28 30 28 29  32  GLUCOSE 186* 136* 160* 126* 187* 177*  BUN 9 8 6 6  5* 7  CREATININE 0.79 0.75 0.70 0.64 0.64 0.77  CALCIUM 9.6 9.2 8.5 8.6 9.1 9.0  MG  --   --   --  1.9  --   --   AST 17  --   --   --   --   --   ALT 19  --   --   --   --   --   ALKPHOS 69  --   --   --   --   --   BILITOT 0.8  --   --   --   --   --    ------------------------------------------------------------------------------------------------------------------ estimated creatinine clearance is 112.1 mL/min (by C-G formula based on Cr of 0.77). ------------------------------------------------------------------------------------------------------------------ No results for input(s): HGBA1C in the last 72 hours. ------------------------------------------------------------------------------------------------------------------ No results for input(s):  CHOL, HDL, LDLCALC, TRIG, CHOLHDL, LDLDIRECT in the last 72 hours. ------------------------------------------------------------------------------------------------------------------ No results for input(s): TSH, T4TOTAL, T3FREE, THYROIDAB in the last 72 hours.  Invalid input(s): FREET3 ------------------------------------------------------------------------------------------------------------------ No results for input(s): VITAMINB12, FOLATE, FERRITIN, TIBC, IRON, RETICCTPCT in the last 72 hours.  Coagulation profile  Recent Labs Lab 03/22/14 0800  INR 1.24    No results for input(s): DDIMER in the last 72 hours.  Cardiac Enzymes No results for input(s): CKMB, TROPONINI, MYOGLOBIN in the last 168 hours.  Invalid input(s): CK ------------------------------------------------------------------------------------------------------------------ Invalid input(s): POCBNP     Time Spent in minutes   15 minutes   Desi Carby DO on 03/27/2014 at 8:51 AM  Between 7am to 7pm - Pager - [phone]  After 7pm go to [email or web address] - password [email or web address]  And look for the night coverage person covering for me after hours  [facility] Office  [phone]

## 2014-03-28 LAB — CBC
HEMATOCRIT: 33 % — AB (ref 36.0–46.0)
Hemoglobin: 10.1 g/dL — ABNORMAL LOW (ref 12.0–15.0)
MCH: 26.3 pg (ref 26.0–34.0)
MCHC: 30.6 g/dL (ref 30.0–36.0)
MCV: 85.9 fL (ref 78.0–100.0)
Platelets: 286 10*3/uL (ref 150–400)
RBC: 3.84 MIL/uL — ABNORMAL LOW (ref 3.87–5.11)
RDW: 14.9 % (ref 11.5–15.5)
WBC: 8.4 10*3/uL (ref 4.0–10.5)

## 2014-03-28 LAB — BASIC METABOLIC PANEL
ANION GAP: 9 (ref 5–15)
BUN: 6 mg/dL (ref 6–23)
CALCIUM: 8.9 mg/dL (ref 8.4–10.5)
CO2: 30 mmol/L (ref 19–32)
Chloride: 101 mEq/L (ref 96–112)
Creatinine, Ser: 0.87 mg/dL (ref 0.50–1.10)
GFR calc Af Amer: 90 mL/min — ABNORMAL LOW (ref >90)
GFR, EST NON AFRICAN AMERICAN: 78 mL/min — AB (ref >90)
GLUCOSE: 188 mg/dL — AB (ref 70–99)
POTASSIUM: 3.3 mmol/L — AB (ref 3.5–5.1)
SODIUM: 140 mmol/L (ref 135–145)

## 2014-03-28 LAB — GLUCOSE, CAPILLARY
GLUCOSE-CAPILLARY: 154 mg/dL — AB (ref 70–99)
GLUCOSE-CAPILLARY: 145 mg/dL — AB (ref 70–99)
Glucose-Capillary: 112 mg/dL — ABNORMAL HIGH (ref 70–99)
Glucose-Capillary: 202 mg/dL — ABNORMAL HIGH (ref 70–99)

## 2014-03-28 LAB — CULTURE, BLOOD (ROUTINE X 2)
CULTURE: NO GROWTH
CULTURE: NO GROWTH

## 2014-03-28 MED ORDER — INSULIN GLARGINE 100 UNIT/ML ~~LOC~~ SOLN
80.0000 [IU] | Freq: Every day | SUBCUTANEOUS | Status: DC
  Administered 2014-03-28: 80 [IU] via SUBCUTANEOUS
  Filled 2014-03-28 (×3): qty 0.8

## 2014-03-28 MED ORDER — POTASSIUM CHLORIDE CRYS ER 20 MEQ PO TBCR
40.0000 meq | EXTENDED_RELEASE_TABLET | Freq: Once | ORAL | Status: AC
  Administered 2014-03-28: 40 meq via ORAL
  Filled 2014-03-28: qty 2

## 2014-03-28 MED ORDER — CEPHALEXIN 500 MG PO CAPS
500.0000 mg | ORAL_CAPSULE | Freq: Four times a day (QID) | ORAL | Status: DC
  Administered 2014-03-28 – 2014-03-29 (×5): 500 mg via ORAL
  Filled 2014-03-28 (×8): qty 1

## 2014-03-28 NOTE — Progress Notes (Signed)
Pt states she does not need any assistance with CPAP or mask application. Pt was encouraged to contact RT throughout the night if needed. RT will continue to monitor as needed.

## 2014-03-28 NOTE — Progress Notes (Signed)
Pt ambulated 300 feet; no complaints; will cont. To monitor.

## 2014-03-28 NOTE — Progress Notes (Signed)
Pt up ambulating in hallway at this time; no needs voiced; will cont. To monitor.

## 2014-03-28 NOTE — Progress Notes (Signed)
K 3.3 this AM; MD paged to make aware; will cont. To monitor.

## 2014-03-28 NOTE — Progress Notes (Signed)
Chart reviewed.                                                                                                                          Patient Demographics  [name], is a 49 y.o. female, DOB - 05/10/1965, AFB:[id number]  Admit date - 03/22/2014   Admitting Physician Ivor Costa, MD  Outpatient Primary MD for the patient is Loura Pardon, MD  LOS - 6   Chief Complaint  Patient presents with  . Palpitations    The patient has been having diarrhea, nausea, vomiting and abdominal pain since 0900hrs.  She also has been having palpitations earlier today.  . Nausea  . Emesis      Admission history of present illness/brief narrative: [name] is a 49 y.o. female past medical history of asthma, diabetes mellitus, depression, hypertension, hyperlipidemia, GERD, diastolic congestive heart failure, hypothyroidism secondary to thyroidectomy because of thyroid cancer,  presents with right leg pain, fever, nausea, vomiting and diarrhea.  No abdominal pain. She has fever and chills. She is not sure whether she took antibiotics recently. But on the Epic record, she seems to have taken amoxicillin on 03/01/14.  The patient also reports having left leg swelling, redness and tenderness over her chin area. There is a small boil over the shin area. she has an enlarged and tender lymph node in the right groin area.   Work up in the ED demonstrates Leukocytosis with WBC 15.3. Negative troponin. Patient is admitted for IV antibiotics to treat cellulitis, she was started on IV vancomycin and Zosyn given her risk factors which includes diabetes and obesity. She has been slow to heal due to the above issues as well as anxiety    Subjective:   C/o burning pain at cellulitis area. Worried about going home. No n/v/d.  Assessment & Plan    R LE Cellulitis and sepsis:  Improving. Change to po abx. Increase activity. Possibly home in am.  diastolic congestive heart failure: 2-D echo on  10/12/13 showed EF 55-60%. Resume diuretics  DM-II: Last A1c 9.7 on 12/20/13. On metformin,invokana and Lantus at home. -Lantus  -SSI  Asthma: stable. No signs of acute exacerbation. 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 Patient has not been followed up with oncologist. She did not have chemotherapy or radiation therapy. She is currently taking Synthroid. Last TSH was 2.96 on 02/14/14. -Continue Synthroid -TSH elevated, recheck outpatient  HTN: -Continue Coreg   Hyperlipidemia: -Continue Zocor  GERD: -Continue Protonix  Code Status: Full  Family Communication:   Disposition Plan: home in am   Procedures  None   Consults   None   Medications  Scheduled Meds: . buPROPion  150 mg Oral Daily  . carvedilol  3.125 mg Oral BID WC  .  ceFAZolin (ANCEF) IV  1 g Intravenous Q8H  . clotrimazole  1 Applicatorful Vaginal QHS  .  ferrous sulfate  325 mg Oral BID WC  . fluticasone  2 spray Each Nare Daily  . heparin  5,000 Units Subcutaneous 3 times per day  . insulin aspart  0-15 Units Subcutaneous TID WC  . insulin aspart  0-5 Units Subcutaneous QHS  . insulin glargine  70 Units Subcutaneous QHS  . latanoprost  1 drop Both Eyes QHS  . levothyroxine  175 mcg Oral QAC breakfast  . loratadine  10 mg Oral Daily  . pantoprazole  40 mg Oral Daily  . sertraline  100 mg Oral Daily  . simvastatin  20 mg Oral q1800  . sodium chloride  3 mL Intravenous Q12H  . spironolactone  25 mg Oral Daily  . torsemide  20 mg Oral Once per day on Mon Thu  . vancomycin  1,000 mg Intravenous Q8H   Continuous Infusions:   PRN Meds:.acetaminophen, albuterol, ondansetron, traMADol, zolpidem  DVT Prophylaxis   Heparin -   Lab Results  Component Value Date   PLT 286 03/28/2014    Antibiotics    Anti-infectives    Start     Dose/Rate Route Frequency Ordered Stop    03/26/14 1100  ceFAZolin (ANCEF) IVPB 1 g/50 mL premix     1 g100 mL/hr over 30 Minutes Intravenous Every 8 hours 03/26/14 0944     03/22/14 1400  vancomycin (VANCOCIN) IVPB 1000 mg/200 mL premix     1,000 mg200 mL/hr over 60 Minutes Intravenous Every 8 hours 03/22/14 0621     03/22/14 0700  vancomycin (VANCOCIN) 2,000 mg in sodium chloride 0.9 % 500 mL IVPB     2,000 mg250 mL/hr over 120 Minutes Intravenous  Once 03/22/14 0621 03/22/14 0922   03/22/14 0630  piperacillin-tazobactam (ZOSYN) IVPB 3.375 g  Status:  Discontinued     3.375 g12.5 mL/hr over 240 Minutes Intravenous 3 times per day 03/22/14 0621 03/25/14 1251   03/22/14 0230  clindamycin (CLEOCIN) IVPB 600 mg     600 mg100 mL/hr over 30 Minutes Intravenous  Once 03/22/14 0219 03/22/14 0352          Objective:   Filed Vitals:   03/27/14 0625 03/27/14 1402 03/27/14 2053 03/28/14 0432  BP: 148/72 157/77 162/80 166/88  Pulse: 60 65 69 66  Temp: 98.7 F (37.1 C) 98.3 F (36.8 C) 98 F (36.7 C) 98 F (36.7 C)  TempSrc: Oral Oral Oral Oral  Resp: 18 17 18 18   Height:      Weight: 121.1 kg (266 lb 15.6 oz)     SpO2: 96% 96% 97% 97%    Wt Readings from Last 3 Encounters:  03/27/14 121.1 kg (266 lb 15.6 oz)  03/01/14 126.327 kg (278 lb 8 oz)  02/14/14 124.739 kg (275 lb)     Intake/Output Summary (Last 24 hours) at 03/28/14 0936 Last data filed at 03/28/14 0851  Gross per 24 hour  Intake   1160 ml  Output    800 ml  Net    360 ml     Physical Exam  Awake Alert, Oriented X 3, No new F.N deficits, Normal affect Lungs clear without WRR cv rrr without mgr Abs obese, s, nt, nd No Cyanosis, right lower extremity minimal patchy erythema. Papule without drainage.   Data Review   Micro Results Recent Results (from the past 240 hour(s))  Culture, blood (routine x 2)     Status: None   Collection Time: 03/22/14  8:00 AM  Result Value Ref Range Status  Specimen Description BLOOD LEFT ANTECUBITAL  Final   Special  Requests BOTTLES DRAWN AEROBIC AND ANAEROBIC 10ML  Final   Culture   Final    NO GROWTH 5 DAYS Performed at [facility]    Report Status 03/28/2014 FINAL  Final  Culture, blood (routine x 2)     Status: None   Collection Time: 03/22/14  8:15 AM  Result Value Ref Range Status   Specimen Description BLOOD LEFT HAND  Final   Special Requests BOTTLES DRAWN AEROBIC AND ANAEROBIC 10ML  Final   Culture   Final    NO GROWTH 5 DAYS Performed at [facility]    Report Status 03/28/2014 FINAL  Final    Radiology Reports No results found.  CBC  Recent Labs Lab 03/21/14 2317  03/23/14 0545 03/24/14 0325 03/25/14 0330 03/27/14 0330 03/28/14 0425  WBC 15.3*  < > 9.6 8.6 7.4 9.2 8.4  HGB 13.1  < > 11.0* 10.6* 10.7* 10.2* 10.1*  HCT 39.8  < > 35.3* 34.2* 34.9* 32.5* 33.0*  PLT 256  < > 195 188 233 266 286  MCV 83.1  < > 85.7 85.3 86.6 85.3 85.9  MCH 27.3  < > 26.7 26.4 26.6 26.8 26.3  MCHC 32.9  < > 31.2 31.0 30.7 31.4 30.6  RDW 14.8  < > 15.3 14.9 15.0 14.8 14.9  LYMPHSABS 0.6*  --   --   --   --   --   --   MONOABS 0.7  --   --   --   --   --   --   EOSABS 0.0  --   --   --   --   --   --   BASOSABS 0.0  --   --   --   --   --   --   < > = values in this interval not displayed.  Chemistries   Recent Labs Lab 03/21/14 2317  03/23/14 0545 03/24/14 0325 03/25/14 0330 03/27/14 0330 03/28/14 0425  NA 134*  < > 135 135 139 138 140  K 3.6  < > 3.6 3.0* 3.7 3.3* 3.3*  CL 98  < > 99 100 102 101 101  CO2 21  < > 30 28 29  32 30  GLUCOSE 186*  < > 160* 126* 187* 177* 188*  BUN 9  < > 6 6 5* 7 6  CREATININE 0.79  < > 0.70 0.64 0.64 0.77 0.87  CALCIUM 9.6  < > 8.5 8.6 9.1 9.0 8.9  MG  --   --   --  1.9  --   --   --   AST 17  --   --   --   --   --   --   ALT 19  --   --   --   --   --   --   ALKPHOS 69  --   --   --   --   --   --   BILITOT 0.8  --   --   --   --   --   --   < > = values in this interval not  displayed. ------------------------------------------------------------------------------------------------------------------ estimated creatinine clearance is 103.1 mL/min (by C-G formula based on Cr of 0.87). ------------------------------------------------------------------------------------------------------------------ No results for input(s): HGBA1C in the last 72 hours. ------------------------------------------------------------------------------------------------------------------ No results for input(s): CHOL, HDL, LDLCALC, TRIG, CHOLHDL, LDLDIRECT in the last 72 hours. ------------------------------------------------------------------------------------------------------------------ No results for  input(s): TSH, T4TOTAL, T3FREE, THYROIDAB in the last 72 hours.  Invalid input(s): FREET3 ------------------------------------------------------------------------------------------------------------------ No results for input(s): VITAMINB12, FOLATE, FERRITIN, TIBC, IRON, RETICCTPCT in the last 72 hours.  Coagulation profile  Recent Labs Lab 03/22/14 0800  INR 1.24    No results for input(s): DDIMER in the last 72 hours.  Cardiac Enzymes No results for input(s): CKMB, TROPONINI, MYOGLOBIN in the last 168 hours.  Invalid input(s): CK ------------------------------------------------------------------------------------------------------------------ Invalid input(s): POCBNP     Time Spent in minutes   25 minutes   Shailen Thielen L MD on 03/28/2014 at 9:36 AM [email or web address] - password [facility]

## 2014-03-29 ENCOUNTER — Ambulatory Visit: Payer: BC Managed Care – PPO | Admitting: Family Medicine

## 2014-03-29 LAB — BASIC METABOLIC PANEL
Anion gap: 10 (ref 5–15)
BUN: 10 mg/dL (ref 6–23)
CALCIUM: 9.1 mg/dL (ref 8.4–10.5)
CHLORIDE: 96 meq/L (ref 96–112)
CO2: 32 mmol/L (ref 19–32)
Creatinine, Ser: 0.86 mg/dL (ref 0.50–1.10)
GFR calc Af Amer: >90 mL/min (ref >90)
GFR calc non Af Amer: 79 mL/min — ABNORMAL LOW (ref >90)
Glucose, Bld: 160 mg/dL — ABNORMAL HIGH (ref 70–99)
POTASSIUM: 3.2 mmol/L — AB (ref 3.5–5.1)
SODIUM: 138 mmol/L (ref 135–145)

## 2014-03-29 LAB — GLUCOSE, CAPILLARY: GLUCOSE-CAPILLARY: 146 mg/dL — AB (ref 70–99)

## 2014-03-29 MED ORDER — CEPHALEXIN 500 MG PO CAPS: 500.0000 mg | ORAL_CAPSULE | Freq: Four times a day (QID) | ORAL | Status: DC

## 2014-03-29 MED ORDER — POTASSIUM CHLORIDE CRYS ER 20 MEQ PO TBCR
40.0000 meq | EXTENDED_RELEASE_TABLET | Freq: Once | ORAL | Status: AC
  Administered 2014-03-29: 40 meq via ORAL
  Filled 2014-03-29: qty 2

## 2014-03-29 MED ORDER — TRAMADOL HCL 50 MG PO TABS: 50.0000 mg | ORAL_TABLET | Freq: Four times a day (QID) | ORAL | Status: DC | PRN

## 2014-03-29 MED ORDER — ACETAMINOPHEN 325 MG PO TABS: 325.0000 mg | ORAL_TABLET | ORAL | Status: DC | PRN

## 2014-03-29 NOTE — Progress Notes (Signed)
Discharge papers reviewed with patient. Patient verbalized understanding. Peripheral IV removed with no complications. Patient has items gathered and is leaving in wheelchair by volunteer.

## 2014-03-29 NOTE — Discharge Summary (Signed)
Physician Discharge Summary  [name] XBD:[id number] DOB: December 01, 1965 DOA: 03/22/2014  PCP: Loura Pardon, MD  Admit date: 03/22/2014 Discharge date: 03/29/2014  Time spent: greater than 30 minutes  Discharge Diagnoses:  Principal Problem:   Cellulitis Active Problems:   Diabetes type 2, uncontrolled   HYPERLIPIDEMIA   Depression   Essential hypertension   Asthma   GERD   Chronic diastolic heart failure   Papillary thyroid carcinoma   Postsurgical hypothyroidism   Sepsis  Discharge Condition: stable  Filed Weights   03/26/14 0513 03/27/14 0625 03/29/14 0504  Weight: 122.925 kg (271 lb) 121.1 kg (266 lb 15.6 oz) 123.832 kg (273 lb)    History of present illness:  49 y.o. female past medical history of asthma, diabetes mellitus, depression, hypertension, hyperlipidemia, GERD, diastolic congestive heart failure, hypothyroidism secondary to thyroidectomy because of thyroid cancer, who presents with right leg pain, fever, nausea, vomiting and diarrhea.  patient reports that at about 9:30 in the morning, she started having nausea, vomiting and diarrhea. She vomited 4 times without blood in the vomitus, but each time she vomited she had a little nose bleeding. She is not taking blood thinner at home. He ahs had 2 watery diarrhea today. No abdominal pain. She has fever and chills. She is not sure whether she took antibiotics recently. But on the Epic record, she seems to have taken amoxicillin on 03/01/14.  The patient also reports having left leg swelling, redness and tenderness over her shin area. There is a small boil over the shin area. she has an enlarged and tender lymph node in the right groin area.   Work up in the ED demonstrates Leukocytosis with WBC 15.3. Negative troponin.    Hospital Course:  started on IV vancomycin and Zosyn. Slow but steady improvement. After several days with significant improvement, changed to po abx and monitored.  By dishcharge, erythema, pain  and induration improved.   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 Patient has not been followed up with oncologist. She is currently taking Synthroid. Last TSH was 2.96 on 02/14/14. -Continue Synthroid -TSH 8, recheck outpatient   Procedures:  none  Consultations:  none  Discharge Exam: Filed Vitals:   03/29/14 0945  BP: 152/77  Pulse: 66  Temp:   Resp:     General: nontoxic. Walking in halls Cardiovascular: RRR Respiratory: CTA Leg with decreased edema, erythema, tenderness  Discharge Instructions   Discharge Instructions    Diet - low sodium heart healthy    Complete by:  As directed      Diet Carb Modified    Complete by:  As directed      Increase activity slowly    Complete by:  As directed           Current Discharge Medication List    START taking these medications   Details  acetaminophen (TYLENOL) 325 MG tablet Take 1 tablet (325 mg total) by mouth every 4 (four) hours as needed (pain/fever).    cephALEXin (KEFLEX) 500 MG capsule Take 1 capsule (500 mg total) by mouth every 6 (six) hours. Qty: 28 capsule, Refills: 0    traMADol (ULTRAM) 50 MG tablet Take 1 tablet (50 mg total) by mouth every 6 (six) hours as needed for moderate pain. Qty: 30 tablet, Refills: 0      CONTINUE these medications which have NOT CHANGED   Details  albuterol (PROAIR HFA) 108 (90 BASE) MCG/ACT inhaler Inhale 2  puffs into the lungs every 4 (four) hours as needed. For wheezing Qty: 1 Inhaler, Refills: 4    B-D ULTRAFINE III SHORT PEN 31G X 8 MM MISC USE 1 DAILY DX CODE 250.0 Qty: 100 each, Refills: 1    buPROPion (WELLBUTRIN XL) 150 MG 24 hr tablet TAKE 1 TABLET BY MOUTH DAILY Qty: 30 tablet, Refills: 5    Canagliflozin (INVOKANA) 100 MG TABS Take 100 mg daily in am Qty: 30 tablet, Refills: 2    carvedilol (COREG) 3.125 MG tablet TAKE 1 TABLET BY MOUTH TWICE A DAY WITH A MEAL Qty: 60  tablet, Refills: 5    cetirizine (ZYRTEC) 10 MG tablet Take 10 mg by mouth daily.    ferrous sulfate 325 (65 FE) MG tablet Take 325 mg by mouth 2 (two) times daily.      fluticasone (FLONASE) 50 MCG/ACT nasal spray Place 2 sprays into the nose daily as needed. For allergies    glucose blood (ONE TOUCH ULTRA TEST) test strip Use 2x a day Qty: 100 each, Refills: 2    Insulin Glargine (TOUJEO SOLOSTAR) 300 UNIT/ML SOPN Inject 80 Units into the skin at bedtime. Qty: 10 pen, Refills: 2    levothyroxine (SYNTHROID, LEVOTHROID) 175 MCG tablet TAKE 1 TABLET BY MOUTH DAILY BEFORE BREAKFAST. Qty: 30 tablet, Refills: 2    metFORMIN (GLUCOPHAGE) 1000 MG tablet Take 1 tablet (1,000 mg total) by mouth 2 (two) times daily with a meal. Qty: 180 tablet, Refills: 3    pantoprazole (PROTONIX) 40 MG tablet TAKE 1 TABLET BY MOUTH DAILY Qty: 30 tablet, Refills: 11    potassium chloride 20 MEQ TBCR Take 20 mEq by mouth 2 (two) times a week. Take with torsemide Qty: 30 tablet, Refills: 6    sertraline (ZOLOFT) 100 MG tablet TAKE 1 TABLET (100 MG TOTAL) BY MOUTH DAILY. Qty: 30 tablet, Refills: 11    simvastatin (ZOCOR) 20 MG tablet TAKE 1 TABLET BY MOUTH EVERY EVENING Qty: 30 tablet, Refills: 11    spironolactone (ALDACTONE) 25 MG tablet TAKE 1 TABLET BY MOUTH EVERY DAY Qty: 30 tablet, Refills: 6    torsemide (DEMADEX) 20 MG tablet Take 20 mg by mouth 2 (two) times a week. Wed and Sat    TRAVATAN Z 0.004 % SOLN ophthalmic solution Place 1 drop into both eyes at bedtime.     zolpidem (AMBIEN) 10 MG tablet TAKE 1/2 TO 1 TABLET BY MOUTH ATBEDTIME AS NEEDED FOR SLEEP Qty: 30 tablet, Refills: 3       Allergies  Allergen Reactions  . Tape Rash    Plastic tape  . Buspirone Hcl Nausea Only  . Glipizide     REACTION: sleepy, low sugar  . Ivp Dye [Iodinated Diagnostic Agents]     Shut kidneys down  . Oxycodone-Acetaminophen Itching   Follow-up Information    Follow up with Loura Pardon, MD.    Specialties:  Family Medicine, Radiology   Contact information:   [address] [place]  [address] [phone]        The results of significant diagnostics from this hospitalization (including imaging, microbiology, ancillary and laboratory) are listed below for reference.    Significant Diagnostic Studies: Dg Tibia/fibula Right  03/25/2014   CLINICAL DATA:  5 days ago she seen a spot show up on her right tib/fib. Since that day, she has had increased swelling and redness to the right tib/fib. Patient states she has had no injury to the right  tib/fib. Patient has a hx of diabetes. Patient states that pain radiates superiorly to right hip. Patient states within the past 5 days, she has had a fever periodically.  EXAM: RIGHT TIBIA AND FIBULA - 2 VIEW  COMPARISON:  None.  FINDINGS: Negative for fracture or dislocation. Normal mineralization and alignment. Small marginal spurs from the patella, medial femoral condyle and medial margin tibial plateau. Corticated ossicle projects posterior to the talus. Calcaneal spurs at the insertion of the Achilles tendon and plantar aponeurosis.  IMPRESSION: 1. Negative for fracture or other acute abnormality. 2. Degenerative changes as above.   Electronically Signed   By: Arne Cleveland M.D.   On: 03/25/2014 09:12    Microbiology: Recent Results (from the past 240 hour(s))  Culture, blood (routine x 2)     Status: None   Collection Time: 03/22/14  8:00 AM  Result Value Ref Range Status   Specimen Description BLOOD LEFT ANTECUBITAL  Final   Special Requests BOTTLES DRAWN AEROBIC AND ANAEROBIC 10ML  Final   Culture   Final    NO GROWTH 5 DAYS Performed at [facility]    Report Status 03/28/2014 FINAL  Final  Culture, blood (routine x 2)     Status: None   Collection Time: 03/22/14  8:15 AM  Result Value Ref Range Status   Specimen Description BLOOD LEFT HAND  Final   Special Requests BOTTLES DRAWN AEROBIC  AND ANAEROBIC 10ML  Final   Culture   Final    NO GROWTH 5 DAYS Performed at [facility]    Report Status 03/28/2014 FINAL  Final     Labs: Basic Metabolic Panel:  Recent Labs Lab 03/24/14 0325 03/25/14 0330 03/27/14 0330 03/28/14 0425 03/29/14 0542  NA 135 139 138 140 138  K 3.0* 3.7 3.3* 3.3* 3.2*  CL 100 102 101 101 96  CO2 28 29 32 30 32  GLUCOSE 126* 187* 177* 188* 160*  BUN 6 5* 7 6 10   CREATININE 0.64 0.64 0.77 0.87 0.86  CALCIUM 8.6 9.1 9.0 8.9 9.1  MG 1.9  --   --   --   --    Liver Function Tests: No results for input(s): AST, ALT, ALKPHOS, BILITOT, PROT, ALBUMIN in the last 168 hours. No results for input(s): LIPASE, AMYLASE in the last 168 hours. No results for input(s): AMMONIA in the last 168 hours. CBC:  Recent Labs Lab 03/23/14 0545 03/24/14 0325 03/25/14 0330 03/27/14 0330 03/28/14 0425  WBC 9.6 8.6 7.4 9.2 8.4  HGB 11.0* 10.6* 10.7* 10.2* 10.1*  HCT 35.3* 34.2* 34.9* 32.5* 33.0*  MCV 85.7 85.3 86.6 85.3 85.9  PLT 195 188 233 266 286   Cardiac Enzymes: No results for input(s): CKTOTAL, CKMB, CKMBINDEX, TROPONINI in the last 168 hours. BNP: BNP (last 3 results) No results for input(s): PROBNP in the last 8760 hours. CBG:  Recent Labs Lab 03/27/14 2052 03/28/14 0558 03/28/14 1131 03/28/14 1649 03/28/14 2055  GLUCAP 191* 154* 145* 112* 202*       Signed:  Xiara Knisley L  [facility] 03/29/2014, 11:44 AM

## 2014-04-01 ENCOUNTER — Encounter: Payer: Self-pay | Admitting: Family Medicine

## 2014-04-01 ENCOUNTER — Ambulatory Visit (INDEPENDENT_AMBULATORY_CARE_PROVIDER_SITE_OTHER): Payer: BLUE CROSS/BLUE SHIELD | Admitting: Family Medicine

## 2014-04-01 VITALS — BP 130/86 | HR 66 | Temp 98.7°F | Ht 65.25 in | Wt 264.0 lb

## 2014-04-01 DIAGNOSIS — L03115 Cellulitis of right lower limb: Secondary | ICD-10-CM

## 2014-04-01 MED ORDER — CEPHALEXIN 500 MG PO CAPS: 500.0000 mg | ORAL_CAPSULE | Freq: Four times a day (QID) | ORAL | Status: DC

## 2014-04-01 MED ORDER — TERCONAZOLE 0.4 % VA CREA: 1.0000 | TOPICAL_CREAM | Freq: Every day | VAGINAL | Status: DC

## 2014-04-01 NOTE — Progress Notes (Signed)
Pre visit review using our clinic review tool, if applicable. No additional management support is needed unless otherwise documented below in the visit note. 

## 2014-04-01 NOTE — Patient Instructions (Signed)
Finish your keflex and extend by another week (I printed a px)  Also another px for yeast infection if needed  Elevate leg -warm compress for 10 minutes at a time  If worse/ fever/ or increased redness or swelling let me know  Take your keflex with a bit of food or milk   Follow up in about a week for a re check   You can return to work on Monday if up to it - if not let me know

## 2014-04-01 NOTE — Progress Notes (Signed)
Subjective:    Patient ID: [name], female    DOB: 02/14/1966, 49 y.o.   MRN: [id number]  HPI Here for hospital follow up  12/29-1/5  Went in to ED with malaise and n/v  Had WBC 15.3  Noted boil L shin at the visit and then tender LN   Adm - on vancomycin and zosyn and then consulted ID  Changed to ancef and cleocin  Dx with poss sepsis BC were neg however   D/c on keflex  Now is uncomfortable to leave it dependent  Walking is much better  Stings and burns at time A lot less swollen   Her blood sugars were up and down in the hospital   No more fever  A little nausea -but no vomiting  This may be from abx Appetite is not back yet   Patient Active Problem List   Diagnosis Date Noted  . Cellulitis 03/22/2014  . Sepsis 03/22/2014  . Cellulitis of right lower extremity   . Strep pharyngitis 03/01/2014  . Routine general medical examination at a health care facility 12/19/2013  . Palpitations 02/03/2013  . Nonspecific abnormal toxicology 10/29/2012  . Yeast vaginitis 10/16/2012  . 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 Papillary thyroid carcinoma 04/27/2012  . Chronic diastolic heart failure 60/63/0160  . Acute renal failure 01/25/2012  . Diastolic CHF 10/93/2355  . Acute diastolic heart failure 73/22/0254  . OSA (obstructive sleep apnea) just got cpap at home this past week 01/17/12 01/17/2012  . Hypertensive urgency 01/16/2012  . CHF (congestive heart failure) 01/16/2012  . Anxiety 01/16/2012  . Chronic allergic rhinitis 10/26/2010  . Insomnia 10/26/2010  . OBESITY, MORBID 04/27/2010  . HEMORRHOIDS, INTERNAL, WITH BLEEDING 08/17/2009  . GERD 04/27/2008  . Essential hypertension 11/03/2007  . HYPERLIPIDEMIA 05/14/2007  . OTHER ORGANIC SLEEP DISORDERS 03/09/2007  . Diabetes type 2, uncontrolled 09/11/2006  . POLYCYSTIC OVARIES 09/11/2006  . ANEMIA-IRON DEFICIENCY 09/11/2006  . Depression 09/11/2006  . EXTERNAL HEMORRHOIDS 09/11/2006  . Asthma  09/11/2006   Past Medical History  Diagnosis Date  . Anemia     iron deficiency  . Asthma     a. 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  . Depression     a. Stress reaction 08/2011 in multiple social stressors.  . Diabetes mellitus   . Hypertension   . Morbid obesity   . Hyperlipidemia   . Vertigo   . PCOS (polycystic ovarian syndrome)   . Gastritis   . Insomnia   . GERD (gastroesophageal reflux disease)   . OSA (obstructive sleep apnea) 01/17/2012  . CHF (congestive heart failure)   . Dysrhythmia   . Hyperthyroidism   . Anxiety   . Chronic kidney disease     /13  . Hemorrhoid   . Arthritis   . Complication of anesthesia     DIFFICULT WAKING- 2011  . Papillary thyroid carcinoma   . Cellulitis and abscess of leg 02/2014   Past Surgical History  Procedure Laterality Date  . Cholecystectomy    . Hemorrhoid surgery    . Tubal ligation    . Shoulder surgery  06/2009  . Tonsillectomy and adenoidectomy  CHILD  . Intrauterine device insertion  2011  . Thyroidectomy  04/10/2012    Procedure: THYROIDECTOMY;  Surgeon: Jerrell Belfast, MD;  Location: [place];  Service: ENT;  Laterality: N/A;  Total Thyroidectomy   History  Substance Use Topics  . Smoking status: Former Smoker --  0.20 packs/day for 5 years    Quit date: 03/25/2004  . Smokeless tobacco: Never Used  . Alcohol Use: No   Family History  Problem Relation Age of Onset  . Cancer Father     colon cancer  . Arthritis Father   . Arthritis Mother   . Asthma Mother   . Hypertension Mother    Allergies  Allergen Reactions  . Tape Rash    Plastic tape  . Buspirone Hcl Nausea Only  . Glipizide     REACTION: sleepy, low sugar  . Ivp Dye [Iodinated Diagnostic Agents]     Shut kidneys down  . Oxycodone-Acetaminophen Itching   Current Outpatient Prescriptions on File Prior to Visit  Medication Sig Dispense Refill  . acetaminophen (TYLENOL) 325 MG tablet Take 1 tablet (325 mg  total) by mouth every 4 (four) hours as needed (pain/fever).    Marland Kitchen albuterol (PROAIR HFA) 108 (90 BASE) MCG/ACT inhaler Inhale 2 puffs into the lungs every 4 (four) hours as needed. For wheezing 1 Inhaler 4  . B-D ULTRAFINE III SHORT PEN 31G X 8 MM MISC USE 1 DAILY DX CODE 250.0 100 each 1  . buPROPion (WELLBUTRIN XL) 150 MG 24 hr tablet TAKE 1 TABLET BY MOUTH DAILY 30 tablet 5  . Canagliflozin (INVOKANA) 100 MG TABS Take 100 mg daily in am 30 tablet 2  . carvedilol (COREG) 3.125 MG tablet TAKE 1 TABLET BY MOUTH TWICE A DAY WITH A MEAL 60 tablet 5  . cetirizine (ZYRTEC) 10 MG tablet Take 10 mg by mouth daily.    . ferrous sulfate 325 (65 FE) MG tablet Take 325 mg by mouth 2 (two) times daily.      . fluticasone (FLONASE) 50 MCG/ACT nasal spray Place 2 sprays into the nose daily as needed. For allergies    . glucose blood (ONE TOUCH ULTRA TEST) test strip Use 2x a day 100 each 2  . Insulin Glargine (TOUJEO SOLOSTAR) 300 UNIT/ML SOPN Inject 80 Units into the skin at bedtime. 10 pen 2  . levothyroxine (SYNTHROID, LEVOTHROID) 175 MCG tablet TAKE 1 TABLET BY MOUTH DAILY BEFORE BREAKFAST. 30 tablet 2  . metFORMIN (GLUCOPHAGE) 1000 MG tablet Take 1 tablet (1,000 mg total) by mouth 2 (two) times daily with a meal. 180 tablet 3  . pantoprazole (PROTONIX) 40 MG tablet TAKE 1 TABLET BY MOUTH DAILY 30 tablet 11  . potassium chloride 20 MEQ TBCR Take 20 mEq by mouth 2 (two) times a week. Take with torsemide 30 tablet 6  . sertraline (ZOLOFT) 100 MG tablet TAKE 1 TABLET (100 MG TOTAL) BY MOUTH DAILY. 30 tablet 11  . simvastatin (ZOCOR) 20 MG tablet TAKE 1 TABLET BY MOUTH EVERY EVENING 30 tablet 11  . spironolactone (ALDACTONE) 25 MG tablet TAKE 1 TABLET BY MOUTH EVERY DAY 30 tablet 6  . torsemide (DEMADEX) 20 MG tablet Take 20 mg by mouth 2 (two) times a week. Wed and Sat    . traMADol (ULTRAM) 50 MG tablet Take 1 tablet (50 mg total) by mouth every 6 (six) hours as needed for moderate pain. 30 tablet 0  .  TRAVATAN Z 0.004 % SOLN ophthalmic solution Place 1 drop into both eyes at bedtime.     Marland Kitchen zolpidem (AMBIEN) 10 MG tablet TAKE 1/2 TO 1 TABLET BY MOUTH ATBEDTIME AS NEEDED FOR SLEEP 30 tablet 3   No current facility-administered medications on file prior to visit.    Review of Systems    Review  of Systems  Constitutional: Negative for fever, appetite change, fatigue and unexpected weight change.  Eyes: Negative for pain and visual disturbance.  Respiratory: Negative for cough and shortness of breath.   Cardiovascular: Negative for cp or palpitations    Gastrointestinal: Negative for , diarrhea and constipation.  Genitourinary: Negative for urgency and frequency. pos for vaginal itching  Skin: Negative for pallor or rash  pos for redness and swelling of R leg  Neurological: Negative for weakness, light-headedness, numbness and headaches.  Hematological: Negative for adenopathy. Does not bruise/bleed easily.  Psychiatric/Behavioral: Negative for dysphoric mood. The patient is not nervous/anxious.      Objective:   Physical Exam  Constitutional: She appears well-developed and well-nourished. No distress.  obese and well appearing   HENT:  Head: Normocephalic and atraumatic.  Mouth/Throat: Oropharynx is clear and moist.  Eyes: Conjunctivae and EOM are normal. Pupils are equal, round, and reactive to light. No scleral icterus.  Neck: Normal range of motion. Neck supple. No JVD present.  Cardiovascular: Normal rate, regular rhythm and intact distal pulses.  Exam reveals no gallop.   Pulmonary/Chest: Effort normal and breath sounds normal. No respiratory distress. She has no wheezes. She has no rales.  No crackles   Musculoskeletal: She exhibits edema and tenderness.  R LE- changes of resolving cellulitis- some erythema and hyperpigmentation  Small area of tenderness above medial ankle  No streaking No skin interruption  Small healed scab in middle without abscess   Lymphadenopathy:     She has no cervical adenopathy.  Neurological: She is alert.  Skin: Skin is warm and dry. There is erythema.  See LE exam   Psychiatric: She has a normal mood and affect.          Assessment & Plan:   Problem List Items Addressed This Visit      Other   Cellulitis of right lower extremity - Primary    Rev hosp records in detail  Much improved - regressing redness/swelling (worse when dependent)  Disc imp of elevation and recommend warm compress Continue keflex for an addnl week  terazol for yeast vaginitis if needed  If improved Monday can see how she does at work - difficult to elevate leg at her job

## 2014-04-03 NOTE — Assessment & Plan Note (Signed)
Rev hosp records in detail  Much improved - regressing redness/swelling (worse when dependent)  Disc imp of elevation and recommend warm compress Continue keflex for an addnl week  terazol for yeast vaginitis if needed  If improved Monday can see how she does at work - difficult to elevate leg at her job

## 2014-04-08 ENCOUNTER — Ambulatory Visit (INDEPENDENT_AMBULATORY_CARE_PROVIDER_SITE_OTHER): Payer: BLUE CROSS/BLUE SHIELD | Admitting: Family Medicine

## 2014-04-08 ENCOUNTER — Encounter: Payer: Self-pay | Admitting: Family Medicine

## 2014-04-08 VITALS — BP 124/84 | HR 74 | Temp 98.0°F | Ht 65.25 in | Wt 264.8 lb

## 2014-04-08 DIAGNOSIS — L03115 Cellulitis of right lower limb: Secondary | ICD-10-CM

## 2014-04-08 LAB — HM DIABETES EYE EXAM

## 2014-04-08 NOTE — Progress Notes (Signed)
Pre visit review using our clinic review tool, if applicable. No additional management support is needed unless otherwise documented below in the visit note. 

## 2014-04-08 NOTE — Assessment & Plan Note (Deleted)
Much improved  Will continue another week of keflex as planned Elevation/heat prn   Update if not starting to improve in a week or if worsening

## 2014-04-08 NOTE — Patient Instructions (Signed)
Your cellulitis is looking better  Continue/ finish the keflex  Keep elevating your leg when you can  Use warm compress when you can   Update if not starting to improve in a week or if worsening

## 2014-04-08 NOTE — Progress Notes (Signed)
Subjective:    Patient ID: [name], female    DOB: February 26, 1966, 49 y.o.   MRN: [id number]  HPI Here for f/u of cellulitis of R leg  Overall doing better  It is much less swollen - less red (still some hyperpigmentation) - and some areas of tenderness  Doing better getting around  Is able to sit and prop leg up at desk - back to work since Monday  No fevers   On keflex - starting 2nd week   Yeast infection is better   Patient Active Problem List   Diagnosis Date Noted  . Cellulitis 03/22/2014  . Sepsis 03/22/2014  . Cellulitis of right lower extremity   . Strep pharyngitis 03/01/2014  . Routine general medical examination at a health care facility 12/19/2013  . Palpitations 02/03/2013  . Nonspecific abnormal toxicology 10/29/2012  . Yeast vaginitis 10/16/2012  . 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 Papillary thyroid carcinoma 04/27/2012  . Chronic diastolic heart failure 44/03/270  . Acute renal failure 01/25/2012  . Diastolic CHF 53/66/4403  . Acute diastolic heart failure 47/42/5956  . OSA (obstructive sleep apnea) just got cpap at home this past week 01/17/12 01/17/2012  . Hypertensive urgency 01/16/2012  . CHF (congestive heart failure) 01/16/2012  . Anxiety 01/16/2012  . Chronic allergic rhinitis 10/26/2010  . Insomnia 10/26/2010  . OBESITY, MORBID 04/27/2010  . HEMORRHOIDS, INTERNAL, WITH BLEEDING 08/17/2009  . GERD 04/27/2008  . Essential hypertension 11/03/2007  . HYPERLIPIDEMIA 05/14/2007  . OTHER ORGANIC SLEEP DISORDERS 03/09/2007  . Diabetes type 2, uncontrolled 09/11/2006  . POLYCYSTIC OVARIES 09/11/2006  . ANEMIA-IRON DEFICIENCY 09/11/2006  . Depression 09/11/2006  . EXTERNAL HEMORRHOIDS 09/11/2006  . Asthma 09/11/2006   Past Medical History  Diagnosis Date  . Anemia     iron deficiency  . Asthma     a. 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  . Depression     a. Stress reaction 08/2011 in  multiple social stressors.  . Diabetes mellitus   . Hypertension   . Morbid obesity   . Hyperlipidemia   . Vertigo   . PCOS (polycystic ovarian syndrome)   . Gastritis   . Insomnia   . GERD (gastroesophageal reflux disease)   . OSA (obstructive sleep apnea) 01/17/2012  . CHF (congestive heart failure)   . Dysrhythmia   . Hyperthyroidism   . Anxiety   . Chronic kidney disease     /13  . Hemorrhoid   . Arthritis   . Complication of anesthesia     DIFFICULT WAKING- 2011  . Papillary thyroid carcinoma   . Cellulitis and abscess of leg 02/2014   Past Surgical History  Procedure Laterality Date  . Cholecystectomy    . Hemorrhoid surgery    . Tubal ligation    . Shoulder surgery  06/2009  . Tonsillectomy and adenoidectomy  CHILD  . Intrauterine device insertion  2011  . Thyroidectomy  04/10/2012    Procedure: THYROIDECTOMY;  Surgeon: Jerrell Belfast, MD;  Location: [place];  Service: ENT;  Laterality: N/A;  Total Thyroidectomy   History  Substance Use Topics  . Smoking status: Former Smoker -- 0.20 packs/day for 5 years    Quit date: 03/25/2004  . Smokeless tobacco: Never Used  . Alcohol Use: No   Family History  Problem Relation Age of Onset  . Cancer Father     colon cancer  . Arthritis Father   . Arthritis Mother   .  Asthma Mother   . Hypertension Mother    Allergies  Allergen Reactions  . Tape Rash    Plastic tape  . Buspirone Hcl Nausea Only  . Glipizide     REACTION: sleepy, low sugar  . Ivp Dye [Iodinated Diagnostic Agents]     Shut kidneys down  . Oxycodone-Acetaminophen Itching   Current Outpatient Prescriptions on File Prior to Visit  Medication Sig Dispense Refill  . acetaminophen (TYLENOL) 325 MG tablet Take 1 tablet (325 mg total) by mouth every 4 (four) hours as needed (pain/fever).    Marland Kitchen albuterol (PROAIR HFA) 108 (90 BASE) MCG/ACT inhaler Inhale 2 puffs into the lungs every 4 (four) hours as needed. For wheezing 1 Inhaler 4  . B-D ULTRAFINE III  SHORT PEN 31G X 8 MM MISC USE 1 DAILY DX CODE 250.0 100 each 1  . buPROPion (WELLBUTRIN XL) 150 MG 24 hr tablet TAKE 1 TABLET BY MOUTH DAILY 30 tablet 5  . Canagliflozin (INVOKANA) 100 MG TABS Take 100 mg daily in am 30 tablet 2  . carvedilol (COREG) 3.125 MG tablet TAKE 1 TABLET BY MOUTH TWICE A DAY WITH A MEAL 60 tablet 5  . cephALEXin (KEFLEX) 500 MG capsule Take 1 capsule (500 mg total) by mouth every 6 (six) hours. 28 capsule 0  . cetirizine (ZYRTEC) 10 MG tablet Take 10 mg by mouth daily.    . ferrous sulfate 325 (65 FE) MG tablet Take 325 mg by mouth 2 (two) times daily.      . fluticasone (FLONASE) 50 MCG/ACT nasal spray Place 2 sprays into the nose daily as needed. For allergies    . glucose blood (ONE TOUCH ULTRA TEST) test strip Use 2x a day 100 each 2  . Insulin Glargine (TOUJEO SOLOSTAR) 300 UNIT/ML SOPN Inject 80 Units into the skin at bedtime. 10 pen 2  . levothyroxine (SYNTHROID, LEVOTHROID) 175 MCG tablet TAKE 1 TABLET BY MOUTH DAILY BEFORE BREAKFAST. 30 tablet 2  . metFORMIN (GLUCOPHAGE) 1000 MG tablet Take 1 tablet (1,000 mg total) by mouth 2 (two) times daily with a meal. 180 tablet 3  . pantoprazole (PROTONIX) 40 MG tablet TAKE 1 TABLET BY MOUTH DAILY 30 tablet 11  . potassium chloride 20 MEQ TBCR Take 20 mEq by mouth 2 (two) times a week. Take with torsemide 30 tablet 6  . sertraline (ZOLOFT) 100 MG tablet TAKE 1 TABLET (100 MG TOTAL) BY MOUTH DAILY. 30 tablet 11  . simvastatin (ZOCOR) 20 MG tablet TAKE 1 TABLET BY MOUTH EVERY EVENING 30 tablet 11  . spironolactone (ALDACTONE) 25 MG tablet TAKE 1 TABLET BY MOUTH EVERY DAY 30 tablet 6  . terconazole (TERAZOL 7) 0.4 % vaginal cream Place 1 applicator vaginally at bedtime. 45 g 0  . torsemide (DEMADEX) 20 MG tablet Take 20 mg by mouth 2 (two) times a week. Wed and Sat    . traMADol (ULTRAM) 50 MG tablet Take 1 tablet (50 mg total) by mouth every 6 (six) hours as needed for moderate pain. 30 tablet 0  . TRAVATAN Z 0.004 % SOLN  ophthalmic solution Place 1 drop into both eyes at bedtime.     Marland Kitchen zolpidem (AMBIEN) 10 MG tablet TAKE 1/2 TO 1 TABLET BY MOUTH ATBEDTIME AS NEEDED FOR SLEEP 30 tablet 3   No current facility-administered medications on file prior to visit.      Review of Systems Review of Systems  Constitutional: Negative for fever, appetite change, fatigue and unexpected weight change.  Eyes: Negative for pain and visual disturbance.  Respiratory: Negative for cough and shortness of breath.   Cardiovascular: Negative for cp or palpitations    Gastrointestinal: Negative for nausea, diarrhea and constipation.  Genitourinary: Negative for urgency and frequency.  Skin: Negative for pallor or rash  pos for redness and hyperpigmentation of R leg that is improved  Neurological: Negative for weakness, light-headedness, numbness and headaches.  Hematological: Negative for adenopathy. Does not bruise/bleed easily.  Psychiatric/Behavioral: Negative for dysphoric mood. The patient is not nervous/anxious.         Objective:   Physical Exam  Constitutional: She appears well-developed and well-nourished. No distress.  obese and well appearing   HENT:  Head: Normocephalic and atraumatic.  Mouth/Throat: Oropharynx is clear and moist.  Eyes: Conjunctivae and EOM are normal. Pupils are equal, round, and reactive to light. No scleral icterus.  Neck: Normal range of motion. Neck supple.  Cardiovascular: Normal rate and regular rhythm.   Musculoskeletal: She exhibits no edema.  Lymphadenopathy:    She has no cervical adenopathy.  Neurological: She is alert.  Skin: Skin is warm and dry. No rash noted. There is erythema. No pallor.  RLE- erythema is much improved, hyperpigmentation remains over ant/ med calf  slt tender in one area only  Small scab remains wihtout drainage    Psychiatric: She has a normal mood and affect.          Assessment & Plan:   Problem List Items Addressed This Visit      Other    Cellulitis of right lower extremity - Primary    Much improved  Continue with another week of keflex as planned Elevate and warm compress when able  Update if not starting to improve in a week or if worsening

## 2014-04-10 NOTE — Assessment & Plan Note (Signed)
Much improved  Continue with another week of keflex as planned Elevate and warm compress when able  Update if not starting to improve in a week or if worsening

## 2014-04-14 ENCOUNTER — Encounter: Payer: Self-pay | Admitting: Family Medicine

## 2014-04-20 ENCOUNTER — Other Ambulatory Visit: Payer: Self-pay | Admitting: Internal Medicine

## 2014-04-25 ENCOUNTER — Encounter: Payer: Self-pay | Admitting: Family Medicine

## 2014-04-29 ENCOUNTER — Other Ambulatory Visit: Payer: Self-pay | Admitting: Internal Medicine

## 2014-05-24 ENCOUNTER — Other Ambulatory Visit: Payer: Self-pay | Admitting: Family Medicine

## 2014-05-24 ENCOUNTER — Other Ambulatory Visit: Payer: Self-pay | Admitting: Internal Medicine

## 2014-06-01 ENCOUNTER — Other Ambulatory Visit: Payer: Self-pay | Admitting: Family Medicine

## 2014-06-01 ENCOUNTER — Other Ambulatory Visit (HOSPITAL_COMMUNITY): Payer: Self-pay | Admitting: Adult Health

## 2014-06-01 ENCOUNTER — Other Ambulatory Visit: Payer: Self-pay | Admitting: Internal Medicine

## 2014-06-01 NOTE — Telephone Encounter (Signed)
Received refill request electronically from pharmacy. Last refill 12/20/13 #30/3, last office visit 04/08/14. Is it okay to refill medication.

## 2014-06-01 NOTE — Telephone Encounter (Signed)
Px written for call in   

## 2014-06-02 NOTE — Telephone Encounter (Signed)
Ambien called into [facility] per Dr Glori Bickers approval.

## 2014-07-03 ENCOUNTER — Other Ambulatory Visit: Payer: Self-pay | Admitting: Internal Medicine

## 2014-07-05 ENCOUNTER — Ambulatory Visit (INDEPENDENT_AMBULATORY_CARE_PROVIDER_SITE_OTHER): Payer: BLUE CROSS/BLUE SHIELD | Admitting: Family Medicine

## 2014-07-05 ENCOUNTER — Encounter: Payer: Self-pay | Admitting: Family Medicine

## 2014-07-05 VITALS — BP 160/80 | HR 76 | Temp 98.2°F | Ht 65.25 in | Wt 274.4 lb

## 2014-07-05 DIAGNOSIS — J0100 Acute maxillary sinusitis, unspecified: Secondary | ICD-10-CM | POA: Diagnosis not present

## 2014-07-05 DIAGNOSIS — J019 Acute sinusitis, unspecified: Secondary | ICD-10-CM | POA: Insufficient documentation

## 2014-07-05 MED ORDER — AMOXICILLIN-POT CLAVULANATE 875-125 MG PO TABS: 1.0000 | ORAL_TABLET | Freq: Two times a day (BID) | ORAL | Status: DC

## 2014-07-05 MED ORDER — BENZONATATE 200 MG PO CAPS: 200.0000 mg | ORAL_CAPSULE | Freq: Three times a day (TID) | ORAL | Status: DC | PRN

## 2014-07-05 NOTE — Assessment & Plan Note (Signed)
From chronic allergy congestion plus /minus viral uri  Disc symptomatic care - see instructions on AVS  Cover with augmentin Tessalon for cough Update if not starting to improve in a week or if worsening

## 2014-07-05 NOTE — Progress Notes (Signed)
Subjective:    Patient ID: [name], female    DOB: 1965/05/28, 49 y.o.   MRN: [id number]  HPI Here with sinus symptoms   Started with allergies - then worse nasal congestion  Bad ST today  Suspect fever - chills and sweats  Coughing - some prod in am - dark yellow - better during the day  Some facial pain - around eyes   otc not taking anything except zyrtec and nasal spray for allergies    Also gassy/bloated abd   Patient Active Problem List   Diagnosis Date Noted  . Cellulitis 03/22/2014  . Sepsis 03/22/2014  . Cellulitis of right lower extremity   . Strep pharyngitis 03/01/2014  . Routine general medical examination at a health care facility 12/19/2013  . Palpitations 02/03/2013  . Nonspecific abnormal toxicology 10/29/2012  . Yeast vaginitis 10/16/2012  . 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 Papillary thyroid carcinoma 04/27/2012  . Chronic diastolic heart failure 53/66/4403  . Acute renal failure 01/25/2012  . Diastolic CHF 47/42/5956  . Acute diastolic heart failure 38/75/6433  . OSA (obstructive sleep apnea) just got cpap at home this past week 01/17/12 01/17/2012  . Hypertensive urgency 01/16/2012  . CHF (congestive heart failure) 01/16/2012  . Anxiety 01/16/2012  . Chronic allergic rhinitis 10/26/2010  . Insomnia 10/26/2010  . OBESITY, MORBID 04/27/2010  . HEMORRHOIDS, INTERNAL, WITH BLEEDING 08/17/2009  . GERD 04/27/2008  . Essential hypertension 11/03/2007  . HYPERLIPIDEMIA 05/14/2007  . OTHER ORGANIC SLEEP DISORDERS 03/09/2007  . Diabetes type 2, uncontrolled 09/11/2006  . POLYCYSTIC OVARIES 09/11/2006  . ANEMIA-IRON DEFICIENCY 09/11/2006  . Depression 09/11/2006  . EXTERNAL HEMORRHOIDS 09/11/2006  . Asthma 09/11/2006   Past Medical History  Diagnosis Date  . Anemia     iron deficiency  . Asthma     a. 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  . Depression     a. Stress reaction 08/2011 in  multiple social stressors.  . Diabetes mellitus   . Hypertension   . Morbid obesity   . Hyperlipidemia   . Vertigo   . PCOS (polycystic ovarian syndrome)   . Gastritis   . Insomnia   . GERD (gastroesophageal reflux disease)   . OSA (obstructive sleep apnea) 01/17/2012  . CHF (congestive heart failure)   . Dysrhythmia   . Hyperthyroidism   . Anxiety   . Chronic kidney disease     /13  . Hemorrhoid   . Arthritis   . Complication of anesthesia     DIFFICULT WAKING- 2011  . Papillary thyroid carcinoma   . Cellulitis and abscess of leg 02/2014   Past Surgical History  Procedure Laterality Date  . Cholecystectomy    . Hemorrhoid surgery    . Tubal ligation    . Shoulder surgery  06/2009  . Tonsillectomy and adenoidectomy  CHILD  . Intrauterine device insertion  2011  . Thyroidectomy  04/10/2012    Procedure: THYROIDECTOMY;  Surgeon: Jerrell Belfast, MD;  Location: [facility];  Service: ENT;  Laterality: N/A;  Total Thyroidectomy   History  Substance Use Topics  . Smoking status: Former Smoker -- 0.20 packs/day for 5 years    Quit date: 03/25/2004  . Smokeless tobacco: Never Used  . Alcohol Use: No   Family History  Problem Relation Age of Onset  . Cancer Father     colon cancer  . Arthritis Father   . Arthritis Mother   . Asthma  Mother   . Hypertension Mother    Allergies  Allergen Reactions  . Tape Rash    Plastic tape  . Buspirone Hcl Nausea Only  . Glipizide     REACTION: sleepy, low sugar  . Ivp Dye [Iodinated Diagnostic Agents]     Shut kidneys down  . Oxycodone-Acetaminophen Itching   Current Outpatient Prescriptions on File Prior to Visit  Medication Sig Dispense Refill  . acetaminophen (TYLENOL) 325 MG tablet Take 1 tablet (325 mg total) by mouth every 4 (four) hours as needed (pain/fever).    Marland Kitchen albuterol (PROAIR HFA) 108 (90 BASE) MCG/ACT inhaler Inhale 2 puffs into the lungs every 4 (four) hours as needed. For wheezing 1 Inhaler 4  . B-D ULTRAFINE III  SHORT PEN 31G X 8 MM MISC USE 1 DAILY DX CODE 250.0 100 each 1  . buPROPion (WELLBUTRIN XL) 150 MG 24 hr tablet TAKE 1 TABLET BY MOUTH DAILY 30 tablet 5  . carvedilol (COREG) 3.125 MG tablet TAKE 1 TABLET BY MOUTH TWICE A DAY WITH A MEAL 60 tablet 5  . cephALEXin (KEFLEX) 500 MG capsule Take 1 capsule (500 mg total) by mouth every 6 (six) hours. 28 capsule 0  . cetirizine (ZYRTEC) 10 MG tablet Take 10 mg by mouth daily.    . ferrous sulfate 325 (65 FE) MG tablet Take 325 mg by mouth 2 (two) times daily.      . fluticasone (FLONASE) 50 MCG/ACT nasal spray Place 2 sprays into the nose daily as needed. For allergies    . glucose blood (ONE TOUCH ULTRA TEST) test strip Use 2x a day 100 each 2  . INVOKANA 100 MG TABS tablet TAKE 1 TABLET BY MOUTH EVERY MORNING 30 tablet 2  . KLOR-CON M20 20 MEQ tablet TAKE 1 TABLET BY MOUTH 2 TIMES A WEEK (TAKE WITH TORSEMIDE) 30 tablet 5  . levothyroxine (SYNTHROID, LEVOTHROID) 175 MCG tablet TAKE 1 TABLET BY MOUTH DAILY BEFORE BREAKFAST. 30 tablet 2  . metFORMIN (GLUCOPHAGE) 1000 MG tablet Take 1 tablet (1,000 mg total) by mouth 2 (two) times daily with a meal. 180 tablet 3  . pantoprazole (PROTONIX) 40 MG tablet TAKE 1 TABLET BY MOUTH DAILY 30 tablet 11  . sertraline (ZOLOFT) 100 MG tablet TAKE 1 TABLET (100 MG TOTAL) BY MOUTH DAILY. 30 tablet 11  . simvastatin (ZOCOR) 20 MG tablet TAKE 1 TABLET BY MOUTH EVERY EVENING 30 tablet 11  . spironolactone (ALDACTONE) 25 MG tablet TAKE 1 TABLET BY MOUTH EVERY DAY 30 tablet 6  . terconazole (TERAZOL 7) 0.4 % vaginal cream Place 1 applicator vaginally at bedtime. 45 g 0  . torsemide (DEMADEX) 20 MG tablet Take 20 mg by mouth 2 (two) times a week. Wed and Sat    . torsemide (DEMADEX) 20 MG tablet TAKE 1 TABLET BY MOUTH 2 TIMES A WEEK. 30 tablet 4  . TOUJEO SOLOSTAR 300 UNIT/ML SOPN INJECT 80 UNITS INTO THE SKIN AT BEDTIME. 9 pen 1  . traMADol (ULTRAM) 50 MG tablet Take 1 tablet (50 mg total) by mouth every 6 (six) hours as  needed for moderate pain. 30 tablet 0  . TRAVATAN Z 0.004 % SOLN ophthalmic solution Place 1 drop into both eyes at bedtime.     Marland Kitchen zolpidem (AMBIEN) 10 MG tablet TAKE 1/2 TO 1 TABLET BY MOUTH DAILY AT NIGHT AS NEEDED FOR SLEEP 30 tablet 3   No current facility-administered medications on file prior to visit.      Review  of Systems Review of Systems  Constitutional: Negative for fever, appetite change, fatigue and unexpected weight change.  ENT pos for cong and rhinorrhea and facial pain and st, neg for ear pain  Eyes: Negative for pain and visual disturbance.  Respiratory: Negative for wheeze and shortness of breath.   Cardiovascular: Negative for cp or palpitations    Gastrointestinal: Negative for nausea, diarrhea and constipation.  Genitourinary: Negative for urgency and frequency.  Skin: Negative for pallor or rash   Neurological: Negative for weakness, light-headedness, numbness and headaches.  Hematological: Negative for adenopathy. Does not bruise/bleed easily.  Psychiatric/Behavioral: Negative for dysphoric mood. The patient is not nervous/anxious.         Objective:   Physical Exam  Constitutional: She appears well-developed and well-nourished. No distress.  obese and well appearing   HENT:  Head: Normocephalic and atraumatic.  Right Ear: External ear normal.  Left Ear: External ear normal.  Mouth/Throat: Oropharynx is clear and moist. No oropharyngeal exudate.  Nares are injected and congested  Bilateral maxillary sinus tenderness  Post nasal drip   Eyes: Conjunctivae and EOM are normal. Pupils are equal, round, and reactive to light. Right eye exhibits no discharge. Left eye exhibits no discharge.  Neck: Normal range of motion. Neck supple.  Cardiovascular: Normal rate and regular rhythm.   Pulmonary/Chest: Effort normal and breath sounds normal. No respiratory distress. She has no wheezes. She has no rales.  Lymphadenopathy:    She has no cervical adenopathy.    Neurological: She is alert. No cranial nerve deficit.  Skin: Skin is warm and dry. No rash noted.  Psychiatric: She has a normal mood and affect.          Assessment & Plan:   Problem List Items Addressed This Visit      Respiratory   Acute sinusitis - Primary    From chronic allergy congestion plus /minus viral uri  Disc symptomatic care - see instructions on AVS  Cover with augmentin Tessalon for cough Update if not starting to improve in a week or if worsening        Relevant Medications   amoxicillin-clavulanate (AUGMENTIN) 875-125 MG per tablet   benzonatate (TESSALON) 200 MG capsule

## 2014-07-05 NOTE — Progress Notes (Signed)
Pre visit review using our clinic review tool, if applicable. No additional management support is needed unless otherwise documented below in the visit note. 

## 2014-07-05 NOTE — Patient Instructions (Signed)
I think you may have a sinus infection from allergies and/or a cold  Take the augmentin as directed  Continue allergy medicines  Try nasal saline spray for congestion  Try the tessalon for cough   Update if not starting to improve in a week or if worsening

## 2014-07-10 ENCOUNTER — Other Ambulatory Visit: Payer: Self-pay | Admitting: Family Medicine

## 2014-07-10 ENCOUNTER — Other Ambulatory Visit (HOSPITAL_COMMUNITY): Payer: Self-pay | Admitting: Internal Medicine

## 2014-08-25 LAB — HM DIABETES EYE EXAM

## 2014-09-05 ENCOUNTER — Other Ambulatory Visit (HOSPITAL_COMMUNITY): Payer: Self-pay | Admitting: Internal Medicine

## 2014-09-06 ENCOUNTER — Other Ambulatory Visit (HOSPITAL_COMMUNITY): Payer: Self-pay

## 2014-09-06 ENCOUNTER — Telehealth (HOSPITAL_COMMUNITY): Payer: Self-pay | Admitting: Vascular Surgery

## 2014-09-06 MED ORDER — SPIRONOLACTONE 25 MG PO TABS: 25.0000 mg | ORAL_TABLET | Freq: Every day | ORAL | Status: DC

## 2014-09-06 NOTE — Telephone Encounter (Signed)
PT called to get refill for spironlactone, she was told she could not get her medication until she made an appt with Bensim[facility], her appt is scheduled 09/23/14.Marland Kitchen She wants to know if she can get her medication refill until her next appt.. Please advise

## 2014-09-14 ENCOUNTER — Telehealth: Payer: Self-pay | Admitting: Internal Medicine

## 2014-09-14 MED ORDER — CANAGLIFLOZIN 100 MG PO TABS: ORAL_TABLET | ORAL | Status: DC

## 2014-09-14 MED ORDER — INSULIN GLARGINE 300 UNIT/ML ~~LOC~~ SOPN: 80.0000 [IU] | PEN_INJECTOR | Freq: Every day | SUBCUTANEOUS | Status: DC

## 2014-09-14 MED ORDER — LEVOTHYROXINE SODIUM 175 MCG PO TABS: ORAL_TABLET | ORAL | Status: DC

## 2014-09-14 NOTE — Telephone Encounter (Signed)
Rx sent per pt's request.  

## 2014-09-14 NOTE — Telephone Encounter (Signed)
Patient came to office to schedule her follow up appointment with Dr. Cruzita Lederer Next available is [phone]  Please send her Rx to pharmacy  Rx: Synthroid [place]: [facility]   Thank you

## 2014-09-19 ENCOUNTER — Other Ambulatory Visit: Payer: Self-pay

## 2014-09-23 ENCOUNTER — Encounter (HOSPITAL_COMMUNITY): Payer: Self-pay

## 2014-09-23 ENCOUNTER — Ambulatory Visit (HOSPITAL_COMMUNITY)
Admission: RE | Admit: 2014-09-23 | Discharge: 2014-09-23 | Disposition: A | Discharge status: Home / Self Care | Payer: BLUE CROSS/BLUE SHIELD | Source: Ambulatory Visit | Attending: Internal Medicine | Admitting: Internal Medicine

## 2014-09-23 VITALS — BP 128/78 | HR 78 | Wt 273.2 lb

## 2014-09-23 DIAGNOSIS — I129 Hypertensive chronic kidney disease with stage 1 through stage 4 chronic kidney disease, or unspecified chronic kidney disease: Secondary | ICD-10-CM | POA: Insufficient documentation

## 2014-09-23 DIAGNOSIS — E669 Obesity, unspecified: Secondary | ICD-10-CM | POA: Insufficient documentation

## 2014-09-23 DIAGNOSIS — E119 Type 2 diabetes mellitus without complications: Secondary | ICD-10-CM | POA: Diagnosis not present

## 2014-09-23 DIAGNOSIS — E785 Hyperlipidemia, unspecified: Secondary | ICD-10-CM | POA: Diagnosis not present

## 2014-09-23 DIAGNOSIS — Z85850 Personal history of malignant neoplasm of thyroid: Secondary | ICD-10-CM | POA: Diagnosis not present

## 2014-09-23 DIAGNOSIS — N189 Chronic kidney disease, unspecified: Secondary | ICD-10-CM | POA: Diagnosis not present

## 2014-09-23 DIAGNOSIS — R079 Chest pain, unspecified: Secondary | ICD-10-CM | POA: Insufficient documentation

## 2014-09-23 DIAGNOSIS — I5032 Chronic diastolic (congestive) heart failure: Secondary | ICD-10-CM

## 2014-09-23 DIAGNOSIS — Z79899 Other long term (current) drug therapy: Secondary | ICD-10-CM | POA: Diagnosis not present

## 2014-09-23 DIAGNOSIS — G4733 Obstructive sleep apnea (adult) (pediatric): Secondary | ICD-10-CM | POA: Diagnosis not present

## 2014-09-23 DIAGNOSIS — F329 Major depressive disorder, single episode, unspecified: Secondary | ICD-10-CM | POA: Diagnosis not present

## 2014-09-23 DIAGNOSIS — E282 Polycystic ovarian syndrome: Secondary | ICD-10-CM | POA: Insufficient documentation

## 2014-09-23 DIAGNOSIS — D509 Iron deficiency anemia, unspecified: Secondary | ICD-10-CM | POA: Insufficient documentation

## 2014-09-23 DIAGNOSIS — Z794 Long term (current) use of insulin: Secondary | ICD-10-CM | POA: Diagnosis not present

## 2014-09-23 DIAGNOSIS — K219 Gastro-esophageal reflux disease without esophagitis: Secondary | ICD-10-CM | POA: Diagnosis not present

## 2014-09-23 LAB — BASIC METABOLIC PANEL
ANION GAP: 10 (ref 5–15)
BUN: 14 mg/dL (ref 6–20)
CALCIUM: 9.1 mg/dL (ref 8.9–10.3)
CHLORIDE: 102 mmol/L (ref 101–111)
CO2: 26 mmol/L (ref 22–32)
CREATININE: 0.75 mg/dL (ref 0.44–1.00)
GFR calc non Af Amer: >60 mL/min (ref >60)
GLUCOSE: 203 mg/dL — AB (ref 65–99)
Potassium: 3.8 mmol/L (ref 3.5–5.1)
SODIUM: 138 mmol/L (ref 135–145)

## 2014-09-23 NOTE — Progress Notes (Signed)
Patient ID: [name], female   DOB: 12-Jun-1965, 49 y.o.   MRN: [id number]   Weight Range  < 280  Baseline proBNP   PCP: Dr Alba Cory  Pulmonary: Dr. Lamonte Sakai ENT: Dr Norma Fredrickson Endocrinologist: Dr Renne Crigler  HPI: [name] is a 49 y/o F hx of DM, HTN, HL, morbid obesity, OSA noncompliant with CPAP due to finances, anxiety, diastolic HF with secondary pulmonary HTN and noncardiac CP. Cath 2006 for CP with only catheter-induced spasm of RCA otherwise normal coronaries. EF was 65% at that time. Developed acute renal failure after CT contrast.   Nuclear Stress 02/17/12: Normal, EF 58%  Echo 10/24: EF 50-55%. Grade 2 diastolic dysfunction. RV mildly dilated. PAPP 68 mmHg  08/18/12 ECHO EF 40% Grade 2 diastolic dysfunction. RV looks good. No significant TR to measure PA pressure.  Echo 7/15: EF 55-60% Normal RV Myoview 7/15: RF and perfusion normal   S/P Thyroidectomy January 2014.   She returns for yearly f/u. Under a lot of stress as husband just got out of hospital for ETOH cirrhosis. She is on FMLA. Weight stable at 273. Edema ok. No orthopnea or PND. Says she feels fine. Occasional chest tightness with stress. Not taking torsemide or potassium. Still takes spiro.   ROS: All systems negative except as listed in HPI, PMH and Problem List.  Past Medical History  Diagnosis Date  . Anemia     iron deficiency  . Asthma     a. 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  . Depression     a. Stress reaction 08/2011 in multiple social stressors.  . Diabetes mellitus   . Hypertension   . Morbid obesity   . Hyperlipidemia   . Vertigo   . PCOS (polycystic ovarian syndrome)   . Gastritis   . Insomnia   . GERD (gastroesophageal reflux disease)   . OSA (obstructive sleep apnea) 01/17/2012  . CHF (congestive heart failure)   . Dysrhythmia   . Hyperthyroidism   . Anxiety   . Chronic kidney disease     /13  . Hemorrhoid   . Arthritis   . Complication of  anesthesia     DIFFICULT WAKING- 2011  . Papillary thyroid carcinoma   . Cellulitis and abscess of leg 02/2014    Current Outpatient Prescriptions  Medication Sig Dispense Refill  . acetaminophen (TYLENOL) 325 MG tablet Take 1 tablet (325 mg total) by mouth every 4 (four) hours as needed (pain/fever).    Marland Kitchen albuterol (PROAIR HFA) 108 (90 BASE) MCG/ACT inhaler Inhale 2 puffs into the lungs every 4 (four) hours as needed. For wheezing 1 Inhaler 4  . B-D ULTRAFINE III SHORT PEN 31G X 8 MM MISC USE 1 DAILY DX CODE 250.0 100 each 1  . buPROPion (WELLBUTRIN XL) 150 MG 24 hr tablet TAKE 1 TABLET BY MOUTH DAILY 30 tablet 5  . canagliflozin (INVOKANA) 100 MG TABS tablet TAKE 1 TABLET BY MOUTH EVERY MORNING 30 tablet 2  . carvedilol (COREG) 3.125 MG tablet TAKE 1 TABLET BY MOUTH TWICE A DAY WITH A MEAL 60 tablet 5  . cetirizine (ZYRTEC) 10 MG tablet Take 10 mg by mouth daily.    . ferrous sulfate 325 (65 FE) MG tablet Take 325 mg by mouth 2 (two) times daily.      . fluticasone (FLONASE) 50 MCG/ACT nasal spray Place 2 sprays into the nose daily as needed. For allergies    . glucose blood (ONE  TOUCH ULTRA TEST) test strip Use 2x a day 100 each 2  . Insulin Glargine (TOUJEO SOLOSTAR) 300 UNIT/ML SOPN Inject 80 Units into the skin at bedtime. 9 pen 1  . levothyroxine (SYNTHROID, LEVOTHROID) 175 MCG tablet TAKE 1 TABLET BY MOUTH DAILY BEFORE BREAKFAST. 30 tablet 2  . metFORMIN (GLUCOPHAGE) 1000 MG tablet Take 1 tablet (1,000 mg total) by mouth 2 (two) times daily with a meal. 180 tablet 3  . pantoprazole (PROTONIX) 40 MG tablet TAKE 1 TABLET BY MOUTH DAILY 30 tablet 11  . sertraline (ZOLOFT) 100 MG tablet TAKE 1 TABLET (100 MG TOTAL) BY MOUTH DAILY. 30 tablet 11  . simvastatin (ZOCOR) 20 MG tablet TAKE 1 TABLET BY MOUTH EVERY EVENING 30 tablet 11  . spironolactone (ALDACTONE) 25 MG tablet Take 1 tablet (25 mg total) by mouth daily. 30 tablet 0  . TRAVATAN Z 0.004 % SOLN ophthalmic solution Place 1 drop  into both eyes at bedtime.     Marland Kitchen zolpidem (AMBIEN) 10 MG tablet TAKE 1/2 TO 1 TABLET BY MOUTH DAILY AT NIGHT AS NEEDED FOR SLEEP 30 tablet 3  . KLOR-CON M20 20 MEQ tablet TAKE 1 TABLET BY MOUTH 2 TIMES A WEEK (TAKE WITH TORSEMIDE) (Patient not taking: Reported on 09/23/2014) 30 tablet 5  . torsemide (DEMADEX) 20 MG tablet Take 20 mg by mouth 2 (two) times a week. Wed and Sat     No current facility-administered medications for this encounter.     PHYSICAL EXAM: Filed Vitals:   09/23/14 0921  BP: 128/78  Pulse: 78  Weight: 273 lb 4 oz (123.945 kg)  SpO2: 97%    General:  Obese. No resp difficulty HEENT: normal Neck: supple. JVP unable to assess due to body habitus looks flat. Carotids 2+ bilaterally; no bruits. No lymphadenopathy or thryomegaly appreciated. Scar Cor: PMI normal. Regular rate & rhythm. No rubs, gallops or murmurs. Lungs: clear Abdomen: obese, soft, nontender. No hepatosplenomegaly. No bruits or masses. Good bowel sounds. Extremities: no cyanosis, clubbing, rash, edema Neuro: alert & orientedx3, cranial nerves grossly intact. Moves all 4 extremities w/o difficulty. Affect pleasant.  ASSESSMENT & PLAN: 1. Chronic diastolic heart failure Volume status ok off torsemdie. Reinforced need for daily weights and reviewed use of sliding scale diuretics. 2. Chest pain  Cath and Myoview ok 3. OSA  Continue CPAP 4. Obesity  Stressed need for weight loss. Suggested low-carb diet ([employer] or Atkins) 5. Probable PFO on TTE  This is physiologic and seen in 30% of people. Right side is normal in size. No embolic phenomenon. No further w/u needed.   F/u as needed  Ramces Shomaker, DanielMD 9:36 AM

## 2014-09-23 NOTE — Addendum Note (Signed)
Encounter addended by: Harvie Junior, CMA on: 09/23/2014  9:48 AM<BR>     Documentation filed: Dx Association, Patient Instructions Section, Orders

## 2014-09-23 NOTE — Patient Instructions (Signed)
LABS today (bmet)  FOLLOW UP AS NEEDED.

## 2014-11-02 ENCOUNTER — Other Ambulatory Visit (HOSPITAL_COMMUNITY): Payer: Self-pay | Admitting: Internal Medicine

## 2014-11-03 ENCOUNTER — Encounter: Payer: Self-pay | Admitting: Internal Medicine

## 2014-11-03 ENCOUNTER — Ambulatory Visit (INDEPENDENT_AMBULATORY_CARE_PROVIDER_SITE_OTHER): Payer: BLUE CROSS/BLUE SHIELD | Admitting: Internal Medicine

## 2014-11-03 VITALS — BP 122/74 | HR 70 | Temp 98.5°F | Resp 12 | Wt 268.8 lb

## 2014-11-03 DIAGNOSIS — C73 Malignant neoplasm of thyroid gland: Secondary | ICD-10-CM | POA: Diagnosis not present

## 2014-11-03 DIAGNOSIS — E1165 Type 2 diabetes mellitus with hyperglycemia: Secondary | ICD-10-CM | POA: Diagnosis not present

## 2014-11-03 DIAGNOSIS — E890 Postprocedural hypothyroidism: Secondary | ICD-10-CM | POA: Diagnosis not present

## 2014-11-03 LAB — HEMOGLOBIN A1C: HEMOGLOBIN A1C: 8.2 % — AB (ref 4.6–6.5)

## 2014-11-03 LAB — TSH: TSH: 1.82 u[IU]/mL (ref 0.35–4.50)

## 2014-11-03 LAB — T4, FREE: Free T4: 1.03 ng/dL (ref 0.60–1.60)

## 2014-11-03 MED ORDER — INSULIN GLARGINE 300 UNIT/ML ~~LOC~~ SOPN: 80.0000 [IU] | PEN_INJECTOR | Freq: Every day | SUBCUTANEOUS | Status: DC

## 2014-11-03 MED ORDER — CANAGLIFLOZIN 300 MG PO TABS
300.0000 mg | ORAL_TABLET | Freq: Every day | ORAL | Status: DC
Start: 2014-11-03 — End: 2015-07-04

## 2014-11-03 MED ORDER — SITAGLIPTIN PHOSPHATE 100 MG PO TABS: 100.0000 mg | ORAL_TABLET | Freq: Every day | ORAL | Status: DC

## 2014-11-03 NOTE — Patient Instructions (Signed)
Please continue: Toujeo 80 units at bedtime. Metformin 1000 mg 2x a day. Invokana but increase to 300 mg in am.  Add januvia 100 mg in am, before breakfast.  Please return in 1.5 month with your sugar log.   Please stop at the lab.  Please start a Multivitamin at night.

## 2014-11-03 NOTE — Progress Notes (Signed)
Patient ID: [name], female   DOB: [birth year], 49 y.o.   MRN: [id number]  HPI: [name] is a 49 y.o.-year-old female, returning for f/u for DM2, dx 2008, insulin-dependent since ~2011, uncontrolled, with complications (peripheral neuropathy, dCHF). I am also following the pt for h/o papillary thyroid cancer and iatrogenic hypothyroidism. Last visit 9 mo ago.  She has a lot of stress - family issues.  DM2: Last hemoglobin A1c was: Lab Results  Component Value Date   HGBA1C 9.7* 12/20/2013   HGBA1C 10.4* 10/01/2013   HGBA1C 10.7* 10/20/2012    Pt is on a regimen of: - Lantus solostar >> Toujeo 80 units qhs - Metformin 1000 mg bid  - started 11/2013 >> tolerates it well - Invokana 100 mg  - started 12/2013 She was also on Glipizide but developed hypoglycemia >> loss of consciousness. She was also on Amaryl >> taken off.   Pt checks her sugars 1x a day and they are higher: - am: 210-300 >> n/c >> 123-160 >> 170-220 - before lunch: 116-163 >> n/c - before dinner: 113 >> n/c - bedtime: 210-300 >> 178, 225-240 >> 155-178, 211 >> n/c No lows. Lowest sugar was 170; she has hypoglycemia awareness at 140. Highest sugar was 275.  Pt's meals are: - Breakfast: skips - 10 o'clock: banana, apple, nectarine - Lunch: fast food - Dinner: goes out to eat around 8 o'clock: chicken sandwich or a wrap - Snacks: 0, no concentrated sweets  - no CKD, last BUN/creatinine:  Lab Results  Component Value Date   BUN 14 09/23/2014   CREATININE 0.75 09/23/2014  Last ACR was 3.4 on 10/20/2012. Not on ACEI/ARB.   - last set of lipids: Lab Results  Component Value Date   CHOL 155 12/20/2013   HDL 36.00* 12/20/2013   LDLCALC 91 12/20/2013   LDLDIRECT 100.0 02/04/2011   TRIG 141.0 12/20/2013   CHOLHDL 4 12/20/2013  She is on Simvastatin.  - last eye exam was in 09/2014. No DR. Has glaucoma - on drops. Will have laser sx. - + numbness and tingling in her feet.  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 It was incidentally found that she had a subcentimeter focus of papillary thyroid cancer, follicular variant. No RAITx.  She was taking the Synthroid in am along with her PPI - no: - fasting - with water  - with Invokana - iron with lunch and bedtime - Protonix at bedtime - no MVI - no Calcium  Reviewed TSH levels: Lab Results  Component Value Date   TSH 8.756* 03/24/2014   TSH 2.96 02/14/2014   TSH 29.85* 12/20/2013   TSH 1.86 09/04/2012   TSH 9.63* 07/27/2012   TSH 9.74 Repeated and verified X2.* 06/26/2012   TSH 24.58* 05/22/2012   TSH 1.528 01/18/2012   TSH 2.90 04/27/2010   TSH 2.86 04/20/2008   ROS: Constitutional: no weight gain, + fatigue, + subjective hyperthermia (hot flushes) Eyes: no blurry vision, no xerophthalmia ENT: no sore throat, no nodules palpated in throat, no dysphagia/odynophagia, no hoarseness Cardiovascular: no CP/no SOB/+ palpitations/no leg swelling Respiratory: no cough/no SOB/no wheezing Gastrointestinal: no N/V/D/C Musculoskeletal: + muscle/+ joint aches Skin: no rashes; + itching Neurological: no tremors/numbness/tingling/dizziness, + HA + low libido, + anxiety  I reviewed pt's medications, allergies, PMH, social hx, family hx, and changes were documented in the history of present illness. Otherwise, unchanged from my initial visit note.  PE: BP 122/74 mmHg  Pulse 70  Temp(Src) 98.5 F (36.9 C) (Oral)  Resp 12  Wt 268 lb 12.8 oz (121.927 kg)  SpO2 97% Wt Readings from Last 3 Encounters:  11/03/14 268 lb 12.8 oz (121.927 kg)  09/23/14 273 lb 4 oz (123.945 kg)  07/05/14 274 lb 6.4 oz (124.467 kg)   Constitutional: obese, central obesity, in NAD Eyes: PERRLA, EOMI, no exophthalmos ENT: moist mucous membranes, no thyromegaly, no cervical lymphadenopathy Cardiovascular: RRR, No MRG Respiratory: CTA B Gastrointestinal:  abdomen soft, NT, ND, BS+ Musculoskeletal: no deformities, strength intact in all 4 Skin: moist, warm, no rashes  ASSESSMENT: 1. DM2, insulin-dependent, uncontrolled, without complications  2. Iatrogenic Hypothyroidism - h/o med noncompliance  3. 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 Patient with a h/o uncontrolled diabetes, on basal insulin regimen + metformin, with improvement after adding Invokana but now high sugars again after being noncompliant with diet and meds due to multiple stressors. - We discussed about options for treatment, and I advised her to increase dose of Invokana to 300 mg daily and add Januvia. Need more sugars later in the day >> advised to start checking and given more logs. Patient Instructions  Please continue: Toujeo 80 units at bedtime. Metformin 1000 mg 2x a day. Invokana but increase to 300 mg in am.  Add januvia 100 mg in am, before breakfast.  Please return in 1.5 month with your sugar log.   Please stop at the lab.  Please start a Multivitamin at night.  - continue checking her sugars at different times of the day - check 2 times a day, rotating checks - advised for yearly eye exams >> she is up to date - Return to clinic in 1.5 month with sugar log - we will check a HbA1c today  2. Iatrogenic Hypothyr - will recheck TFTs  - reassessed how she is taking the LT4 >> she is now taking it correctly, every day - continue LT4 175 mcg daily  3. Follicular PTC - will repeat U/S now  Office Visit on 11/03/2014  Component Date Value Ref Range Status  . TSH 11/03/2014 1.82  0.35 - 4.50 uIU/mL Final  . Free T4 11/03/2014 1.03  0.60 - 1.60 ng/dL Final  . Hgb A1c MFr Bld 11/03/2014 8.2* 4.6 - 6.5 % Final   Glycemic Control Guidelines for People with Diabetes:Non Diabetic:  <6%Goal of Therapy: <7%Additional Action Suggested:  >8%    TFTs normal. HbA1c improved! Thyroid U/S pending.

## 2014-11-04 ENCOUNTER — Encounter: Payer: Self-pay | Admitting: Internal Medicine

## 2014-11-21 ENCOUNTER — Other Ambulatory Visit: Payer: BLUE CROSS/BLUE SHIELD

## 2014-11-23 ENCOUNTER — Ambulatory Visit
Admission: RE | Admit: 2014-11-23 | Discharge: 2014-11-23 | Disposition: A | Discharge status: Home / Self Care | Payer: BLUE CROSS/BLUE SHIELD | Source: Ambulatory Visit | Attending: Internal Medicine | Admitting: Internal Medicine

## 2014-11-27 ENCOUNTER — Other Ambulatory Visit: Payer: Self-pay | Admitting: Family Medicine

## 2014-11-29 NOTE — Telephone Encounter (Signed)
Px written for call in   

## 2014-11-29 NOTE — Telephone Encounter (Signed)
Rx called in as prescribed 

## 2014-11-29 NOTE — Telephone Encounter (Signed)
Electronic refill request, last refilled on 06/01/14 #30 with 3 additional refills, please advise

## 2014-12-02 ENCOUNTER — Telehealth: Payer: Self-pay | Admitting: *Deleted

## 2014-12-02 ENCOUNTER — Other Ambulatory Visit (HOSPITAL_COMMUNITY): Payer: Self-pay | Admitting: Internal Medicine

## 2014-12-02 NOTE — Telephone Encounter (Signed)
Pt faxed a copy of her blood sugar log to Dr Cruzita Lederer. She advised pt to increase her Toujeo to 90 units daily. Called pt and lvm advising her per Dr Cruzita Lederer. Advised pt if she needs a refill of her Toujeo to call and let us know.

## 2014-12-05 ENCOUNTER — Telehealth (HOSPITAL_COMMUNITY): Payer: Self-pay | Admitting: Vascular Surgery

## 2014-12-05 NOTE — Telephone Encounter (Signed)
Pt called she states she needs a note for work parking, her work parking lot is on a incline she does not thinks she could walk it, her boss told her she needs a letter from the doctor so she could park closer to the building .Marland Kitchen Please advise

## 2014-12-09 ENCOUNTER — Telehealth: Payer: Self-pay | Admitting: Internal Medicine

## 2014-12-09 MED ORDER — INSULIN GLARGINE 300 UNIT/ML ~~LOC~~ SOPN: 90.0000 [IU] | PEN_INJECTOR | Freq: Every day | SUBCUTANEOUS | Status: DC

## 2014-12-09 NOTE — Telephone Encounter (Signed)
Rx dosage correction completed and sent to pharmacy.

## 2014-12-09 NOTE — Telephone Encounter (Signed)
Will review with provider again [phone] call patient once letter is done

## 2014-12-09 NOTE — Telephone Encounter (Signed)
toujeo insulin currently has rx for 80 u but she was increased to 90 u please call in [facility] [address]

## 2014-12-13 ENCOUNTER — Encounter: Payer: Self-pay | Admitting: Family Medicine

## 2014-12-14 ENCOUNTER — Telehealth: Payer: Self-pay | Admitting: Family Medicine

## 2014-12-14 NOTE — Telephone Encounter (Signed)
Please let pt know her letter for parking is done

## 2014-12-14 NOTE — Telephone Encounter (Signed)
Pt advise letter ready and requested that I fax it to her secure fax #, letter faxed to pt

## 2014-12-20 ENCOUNTER — Ambulatory Visit: Payer: BLUE CROSS/BLUE SHIELD | Admitting: Internal Medicine

## 2014-12-23 ENCOUNTER — Encounter: Payer: Self-pay | Admitting: Gastroenterology

## 2015-01-05 ENCOUNTER — Other Ambulatory Visit: Payer: Self-pay | Admitting: Internal Medicine

## 2015-01-05 ENCOUNTER — Other Ambulatory Visit (HOSPITAL_COMMUNITY): Payer: Self-pay | Admitting: Internal Medicine

## 2015-01-05 ENCOUNTER — Other Ambulatory Visit: Payer: Self-pay | Admitting: Family Medicine

## 2015-01-05 NOTE — Telephone Encounter (Signed)
Electronic refill request, pt is due for a refill of Rxs, please advise

## 2015-01-05 NOTE — Telephone Encounter (Signed)
Please schedule PE for about 6 mo and refill until then

## 2015-01-06 NOTE — Telephone Encounter (Signed)
appt scheduled and meds refilled 

## 2015-01-09 ENCOUNTER — Encounter: Payer: Self-pay | Admitting: Gastroenterology

## 2015-01-25 LAB — HM PAP SMEAR: HM Pap smear: NORMAL

## 2015-01-25 LAB — HM MAMMOGRAPHY: HM MAMMO: NORMAL (ref 0–4)

## 2015-01-30 ENCOUNTER — Other Ambulatory Visit: Payer: Self-pay | Admitting: Internal Medicine

## 2015-01-31 ENCOUNTER — Other Ambulatory Visit: Payer: Self-pay | Admitting: Family Medicine

## 2015-02-06 ENCOUNTER — Other Ambulatory Visit: Payer: Self-pay | Admitting: Family Medicine

## 2015-02-07 ENCOUNTER — Other Ambulatory Visit: Payer: Self-pay | Admitting: Internal Medicine

## 2015-02-08 ENCOUNTER — Encounter: Payer: Self-pay | Admitting: Family Medicine

## 2015-02-13 ENCOUNTER — Other Ambulatory Visit: Payer: Self-pay | Admitting: Internal Medicine

## 2015-02-14 ENCOUNTER — Other Ambulatory Visit: Payer: Self-pay | Admitting: *Deleted

## 2015-02-14 MED ORDER — INSULIN GLARGINE 300 UNIT/ML ~~LOC~~ SOPN
80.0000 [IU] | PEN_INJECTOR | Freq: Every day | SUBCUTANEOUS | Status: DC
Start: 2015-02-14 — End: 2015-05-18

## 2015-02-14 NOTE — Telephone Encounter (Signed)
Pt's ins required 90 day supply. Resent for a 90 day supply.

## 2015-03-05 ENCOUNTER — Other Ambulatory Visit (HOSPITAL_COMMUNITY): Payer: Self-pay | Admitting: Internal Medicine

## 2015-03-14 ENCOUNTER — Other Ambulatory Visit: Payer: Self-pay | Admitting: Internal Medicine

## 2015-04-26 ENCOUNTER — Other Ambulatory Visit: Payer: Self-pay | Admitting: Internal Medicine

## 2015-05-01 ENCOUNTER — Encounter: Payer: Self-pay | Admitting: Family Medicine

## 2015-05-01 MED ORDER — CETIRIZINE HCL 10 MG PO TABS: 10.0000 mg | ORAL_TABLET | Freq: Every day | ORAL | Status: DC

## 2015-05-17 ENCOUNTER — Other Ambulatory Visit (HOSPITAL_COMMUNITY): Payer: Self-pay | Admitting: Internal Medicine

## 2015-05-18 ENCOUNTER — Other Ambulatory Visit: Payer: Self-pay | Admitting: Family Medicine

## 2015-05-18 ENCOUNTER — Other Ambulatory Visit: Payer: Self-pay | Admitting: Internal Medicine

## 2015-05-18 NOTE — Telephone Encounter (Signed)
Rx called in as prescribed 

## 2015-05-18 NOTE — Telephone Encounter (Signed)
Px written for call in   

## 2015-05-18 NOTE — Telephone Encounter (Signed)
Electronic refill request, pt has CPE scheduled on 06/30/15, last refilled on 11/29/14 #30 with 3 additional refills, please advise

## 2015-06-04 ENCOUNTER — Other Ambulatory Visit: Payer: Self-pay | Admitting: Internal Medicine

## 2015-06-04 ENCOUNTER — Other Ambulatory Visit: Payer: Self-pay | Admitting: Family Medicine

## 2015-06-11 ENCOUNTER — Telehealth: Payer: Self-pay | Admitting: Family Medicine

## 2015-06-11 DIAGNOSIS — Z0000 Encounter for general adult medical examination without abnormal findings: Secondary | ICD-10-CM

## 2015-06-11 DIAGNOSIS — IMO0001 Reserved for inherently not codable concepts without codable children: Secondary | ICD-10-CM

## 2015-06-11 DIAGNOSIS — E1165 Type 2 diabetes mellitus with hyperglycemia: Secondary | ICD-10-CM

## 2015-06-11 NOTE — Telephone Encounter (Signed)
-----   Message from Ellamae Sia sent at 06/07/2015 12:00 PM EDT ----- Regarding: Lab orders for Friday, 3.24.17 Patient is scheduled for CPX labs, please order future labs, Thanks , Karna Christmas

## 2015-06-14 ENCOUNTER — Encounter: Payer: Self-pay | Admitting: Gastroenterology

## 2015-06-16 ENCOUNTER — Other Ambulatory Visit (INDEPENDENT_AMBULATORY_CARE_PROVIDER_SITE_OTHER): Payer: BLUE CROSS/BLUE SHIELD

## 2015-06-16 DIAGNOSIS — Z0000 Encounter for general adult medical examination without abnormal findings: Secondary | ICD-10-CM | POA: Diagnosis not present

## 2015-06-16 DIAGNOSIS — E1165 Type 2 diabetes mellitus with hyperglycemia: Secondary | ICD-10-CM | POA: Diagnosis not present

## 2015-06-16 DIAGNOSIS — IMO0001 Reserved for inherently not codable concepts without codable children: Secondary | ICD-10-CM

## 2015-06-16 LAB — CBC WITH DIFFERENTIAL/PLATELET
BASOS ABS: 0.1 10*3/uL (ref 0.0–0.1)
BASOS PCT: 0.7 % (ref 0.0–3.0)
EOS ABS: 0.3 10*3/uL (ref 0.0–0.7)
Eosinophils Relative: 2.8 % (ref 0.0–5.0)
HCT: 33.8 % — ABNORMAL LOW (ref 36.0–46.0)
HEMOGLOBIN: 10.9 g/dL — AB (ref 12.0–15.0)
LYMPHS PCT: 17.9 % (ref 12.0–46.0)
Lymphs Abs: 1.7 10*3/uL (ref 0.7–4.0)
MCHC: 32.1 g/dL (ref 30.0–36.0)
MCV: 86.5 fl (ref 78.0–100.0)
MONO ABS: 0.5 10*3/uL (ref 0.1–1.0)
Monocytes Relative: 4.9 % (ref 3.0–12.0)
Neutro Abs: 6.8 10*3/uL (ref 1.4–7.7)
Neutrophils Relative %: 73.7 % (ref 43.0–77.0)
Platelets: 292 10*3/uL (ref 150.0–400.0)
RBC: 3.91 Mil/uL (ref 3.87–5.11)
RDW: 15.5 % (ref 11.5–15.5)
WBC: 9.3 10*3/uL (ref 4.0–10.5)

## 2015-06-16 LAB — HEMOGLOBIN A1C: Hgb A1c MFr Bld: 7.2 % — ABNORMAL HIGH (ref 4.6–6.5)

## 2015-06-16 LAB — COMPREHENSIVE METABOLIC PANEL
ALBUMIN: 4.1 g/dL (ref 3.5–5.2)
ALT: 14 U/L (ref 0–35)
AST: 14 U/L (ref 0–37)
Alkaline Phosphatase: 56 U/L (ref 39–117)
BILIRUBIN TOTAL: 0.3 mg/dL (ref 0.2–1.2)
BUN: 12 mg/dL (ref 6–23)
CHLORIDE: 105 meq/L (ref 96–112)
CO2: 26 meq/L (ref 19–32)
CREATININE: 0.73 mg/dL (ref 0.40–1.20)
Calcium: 9.2 mg/dL (ref 8.4–10.5)
GFR: 89.83 mL/min (ref >60.00)
Glucose, Bld: 156 mg/dL — ABNORMAL HIGH (ref 70–99)
Potassium: 4.1 mEq/L (ref 3.5–5.1)
SODIUM: 138 meq/L (ref 135–145)
Total Protein: 7.1 g/dL (ref 6.0–8.3)

## 2015-06-16 LAB — TSH: TSH: 11.83 u[IU]/mL — ABNORMAL HIGH (ref 0.35–4.50)

## 2015-06-16 LAB — LIPID PANEL
Cholesterol: 147 mg/dL (ref 0–200)
HDL: 41.5 mg/dL (ref >39.00)
LDL CALC: 71 mg/dL (ref 0–99)
NONHDL: 105.97
Total CHOL/HDL Ratio: 4
Triglycerides: 177 mg/dL — ABNORMAL HIGH (ref 0.0–149.0)
VLDL: 35.4 mg/dL (ref 0.0–40.0)

## 2015-06-19 LAB — MICROALBUMIN / CREATININE URINE RATIO
CREATININE, U: 61.9 mg/dL
MICROALB/CREAT RATIO: 1.1 mg/g (ref 0.0–30.0)
Microalb, Ur: <0.7 mg/dL (ref 0.0–1.9)

## 2015-06-19 NOTE — Addendum Note (Signed)
Addended by: Marchia Bond on: 06/19/2015 02:55 PM   Modules accepted: Orders

## 2015-06-22 ENCOUNTER — Telehealth: Payer: Self-pay | Admitting: Internal Medicine

## 2015-06-22 MED ORDER — INSULIN GLARGINE 300 UNIT/ML ~~LOC~~ SOPN: 80.0000 [IU] | PEN_INJECTOR | Freq: Every day | SUBCUTANEOUS | Status: DC

## 2015-06-22 NOTE — Telephone Encounter (Signed)
Patient asked if she could get a refill of her TOUJEO just enough to last her until her appointment.please advise

## 2015-06-23 ENCOUNTER — Encounter: Payer: BLUE CROSS/BLUE SHIELD | Admitting: Family Medicine

## 2015-06-30 ENCOUNTER — Encounter: Payer: Self-pay | Admitting: Family Medicine

## 2015-06-30 ENCOUNTER — Ambulatory Visit (INDEPENDENT_AMBULATORY_CARE_PROVIDER_SITE_OTHER): Payer: BLUE CROSS/BLUE SHIELD | Admitting: Family Medicine

## 2015-06-30 VITALS — BP 124/68 | HR 74 | Temp 97.8°F | Ht 64.75 in | Wt 274.5 lb

## 2015-06-30 DIAGNOSIS — I5032 Chronic diastolic (congestive) heart failure: Secondary | ICD-10-CM | POA: Diagnosis not present

## 2015-06-30 DIAGNOSIS — D509 Iron deficiency anemia, unspecified: Secondary | ICD-10-CM

## 2015-06-30 DIAGNOSIS — Z0000 Encounter for general adult medical examination without abnormal findings: Secondary | ICD-10-CM

## 2015-06-30 DIAGNOSIS — E890 Postprocedural hypothyroidism: Secondary | ICD-10-CM

## 2015-06-30 DIAGNOSIS — I10 Essential (primary) hypertension: Secondary | ICD-10-CM

## 2015-06-30 DIAGNOSIS — E782 Mixed hyperlipidemia: Secondary | ICD-10-CM

## 2015-06-30 NOTE — Assessment & Plan Note (Signed)
Lab Results  Component Value Date   TSH 11.83* 06/16/2015   Pt will d/w her endocrinologist at f/u upcoming

## 2015-06-30 NOTE — Assessment & Plan Note (Signed)
Disc goals for lipids and reasons to control them Rev labs with pt Rev low sat fat diet in detail Improved Commended on better diet choices

## 2015-06-30 NOTE — Patient Instructions (Signed)
Make sure you are getting 3 small meals per day with protein  See Dr Cruzita Lederer for thyroid and diabetes (your tsh is high)  Make sure to re schedule your eye procedure when you can  See the dermatologist every several years  Stay as active as you can -start exercising when you get a chance

## 2015-06-30 NOTE — Progress Notes (Signed)
Pre visit review using our clinic review tool, if applicable. No additional management support is needed unless otherwise documented below in the visit note. 

## 2015-06-30 NOTE — Assessment & Plan Note (Signed)
Discussed how this problem influences overall health and the risks it imposes  Reviewed plan for weight loss with lower calorie diet (via better food choices and also portion control or program like weight watchers) and exercise building up to or more than 30 minutes 5 days per week including some aerobic activity   Unfortunately pt has no time for self care due to full time work and care for husband and mother

## 2015-06-30 NOTE — Assessment & Plan Note (Signed)
bp in fair control at this time  BP Readings from Last 1 Encounters:  06/30/15 124/68   No changes needed Disc lifstyle change with low sodium diet and exercise  Labs reviewed Wt loss enc

## 2015-06-30 NOTE — Assessment & Plan Note (Signed)
Reviewed health habits including diet and exercise and skin cancer prevention Reviewed appropriate screening tests for age  Also reviewed health mt list, fam hx and immunization status , as well as social and family history   See HPI Labs rev  Enc wt loss and self care (pt is too busy taking care of other people) Make sure you are getting 3 small meals per day with protein  See Dr Cruzita Lederer for thyroid and diabetes (your tsh is high)  Make sure to re schedule your eye procedure when you can  See the dermatologist every several years  Stay as active as you can -start exercising when you get a chance

## 2015-06-30 NOTE — Assessment & Plan Note (Signed)
Stable currently on iron  For f/u with GI soon for colonoscopy and also to disc GI symptoms

## 2015-06-30 NOTE — Assessment & Plan Note (Signed)
Improved with cpap Uses demadex rarely for edema Reassuring exam today  Wt loss enc

## 2015-06-30 NOTE — Progress Notes (Signed)
Subjective:    Patient ID: [name], female    DOB: 20-May-1965, 50 y.o.   MRN: [id number]  HPI Here for health maintenance exam and to review chronic medical problems   Doing pretty good   A lot of stress- caring for her mother and husband (who needs a liver transplant) A lot going on  Does not want to see a counselor right now -did not help in the past  She crochets- that gives her some peace   Daughter is getting married in may   Husband is good support   Feels like she is in a fog  Cannot remember anything     Wt is up 6 lb with bmi of 29 Taking care  of her husband -not a lot of time for self care    HIV screen-declines-not high risk   Colonoscopy 10/11 -polyps and has fam hx of colon cancer in her mother  They sent her a letter and she scheduled an appt for 5/17  Having a lot of gas and bloating in her abdomen , sour belches (worse in the past 2 mo)  No heartburn  Anal discomfort - has a hemorrhoid and a lot of bleeding (of note she had surgery on hemorrhoids in the past)  Always has loose stools - urgent and crampy  Her appt is to see the doctor before the colonoscopy Symptoms are worse if she is stressed    Flu shot 11/16  Mm 11/16 normal gyn No lumps on self exam   Pap 11/16 gyn   iud is out-no periods since   PNA vaccine 11/13  Td [id number]   Hx of diastolic dysfunction - uses cpap and that pretty much controls it  If she gets swollen (one episode 2 wk ago)- so the took a demadex  Saw cardiology in June - and was doing well   tsh is high  Lab Results  Component Value Date   TSH 11.83* 06/16/2015   hx of thyroid ca and surgery Lab Results  Component Value Date   HGBA1C 7.2* 06/16/2015   Improved from 8.2  Seeing Dr Cruzita Lederer for both -soon   Anemia is stable Lab Results  Component Value Date   WBC 9.3 06/16/2015   HGB 10.9* 06/16/2015   HCT 33.8* 06/16/2015   MCV 86.5 06/16/2015   PLT 292.0 06/16/2015   a little improved from  last time   Cholesterol  Lab Results  Component Value Date   CHOL 147 06/16/2015   CHOL 155 12/20/2013   CHOL 138 01/17/2012   Lab Results  Component Value Date   HDL 41.50 06/16/2015   HDL 36.00* 12/20/2013   HDL 38* 01/17/2012   Lab Results  Component Value Date   LDLCALC 71 06/16/2015   LDLCALC 91 12/20/2013   LDLCALC 78 01/17/2012    Lab Results  Component Value Date   CHOLHDL 4 06/16/2015   CHOLHDL 4 12/20/2013   CHOLHDL 3.6 01/17/2012   Lab Results  Component Value Date   LDLDIRECT 100.0 02/04/2011   LDLDIRECT 98.9 10/13/2007   Overall improved  She does watch her diet   Patient Active Problem List   Diagnosis Date Noted  . Acute sinusitis 07/05/2014  . Cellulitis 03/22/2014  . Sepsis ([place]) 03/22/2014  . Cellulitis of right lower extremity   . Strep pharyngitis 03/01/2014  . Routine general medical examination at a health care facility 12/19/2013  . Palpitations 02/03/2013  . Nonspecific abnormal toxicology 10/29/2012  .  Yeast vaginitis 10/16/2012  . 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 Papillary thyroid carcinoma ([place]) 04/27/2012  . Chronic diastolic heart failure ([place]) 02/10/2012  . Acute renal failure ([place]) 01/25/2012  . Diastolic CHF ([place]) [id number]  . Acute diastolic heart failure ([place]) 01/17/2012  . OSA (obstructive sleep apnea) just got cpap at home this past week 01/17/12 01/17/2012  . Hypertensive urgency 01/16/2012  . CHF (congestive heart failure) ([place]) 01/16/2012  . Anxiety 01/16/2012  . Chronic allergic rhinitis 10/26/2010  . Insomnia 10/26/2010  . OBESITY, MORBID 04/27/2010  . HEMORRHOIDS, INTERNAL, WITH BLEEDING 08/17/2009  . GERD 04/27/2008  . Essential hypertension 11/03/2007  . HYPERLIPIDEMIA 05/14/2007  . OTHER ORGANIC SLEEP DISORDERS 03/09/2007  . Diabetes type 2, uncontrolled ([place]) 09/11/2006  . POLYCYSTIC OVARIES 09/11/2006  . ANEMIA-IRON DEFICIENCY 09/11/2006  . Depression 09/11/2006  . EXTERNAL HEMORRHOIDS  09/11/2006  . Asthma 09/11/2006   Past Medical History  Diagnosis Date  . Anemia     iron deficiency  . Asthma     a. 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  . Depression     a. Stress reaction 08/2011 in multiple social stressors.  . Diabetes mellitus   . Hypertension   . Morbid obesity ([place])   . Hyperlipidemia   . Vertigo   . PCOS (polycystic ovarian syndrome)   . Gastritis   . Insomnia   . GERD (gastroesophageal reflux disease)   . OSA (obstructive sleep apnea) 01/17/2012  . CHF (congestive heart failure) ([place])   . Dysrhythmia   . Hyperthyroidism   . Anxiety   . Chronic kidney disease     /13  . Hemorrhoid   . Arthritis   . Complication of anesthesia     DIFFICULT WAKING- 2011  . Papillary thyroid carcinoma ([place])   . Cellulitis and abscess of leg 02/2014   Past Surgical History  Procedure Laterality Date  . Cholecystectomy    . Hemorrhoid surgery    . Tubal ligation    . Shoulder surgery  06/2009  . Tonsillectomy and adenoidectomy  CHILD  . Intrauterine device insertion  2011  . Thyroidectomy  04/10/2012    Procedure: THYROIDECTOMY;  Surgeon: Jerrell Belfast, MD;  Location: [facility];  Service: ENT;  Laterality: N/A;  Total Thyroidectomy   Social History  Substance Use Topics  . Smoking status: Former Smoker -- 0.20 packs/day for 5 years    Quit date: 03/25/2004  . Smokeless tobacco: Never Used  . Alcohol Use: No   Family History  Problem Relation Age of Onset  . Cancer Father     colon cancer  . Arthritis Father   . Arthritis Mother   . Asthma Mother   . Hypertension Mother    Allergies  Allergen Reactions  . Tape Rash    Plastic tape  . Buspirone Hcl Nausea Only  . Glipizide     REACTION: sleepy, low sugar  . Ivp Dye [Iodinated Diagnostic Agents]     Shut kidneys down  . Oxycodone-Acetaminophen Itching   Current Outpatient Prescriptions on File Prior to Visit  Medication Sig Dispense Refill  . acetaminophen  (TYLENOL) 325 MG tablet Take 1 tablet (325 mg total) by mouth every 4 (four) hours as needed (pain/fever).    Marland Kitchen albuterol (PROAIR HFA) 108 (90 BASE) MCG/ACT inhaler Inhale 2 puffs into the lungs every 4 (four) hours as needed. For wheezing 1 Inhaler 4  . B-D ULTRAFINE III SHORT PEN 31G X 8 MM  MISC USE ONCE A DAY 100 each 1  . buPROPion (WELLBUTRIN XL) 150 MG 24 hr tablet TAKE 1 TABLET BY MOUTH DAILY 30 tablet 5  . canagliflozin (INVOKANA) 300 MG TABS tablet Take 300 mg by mouth daily before breakfast. 30 tablet 2  . carvedilol (COREG) 3.125 MG tablet TAKE 1 TABLET BY MOUTH TWICE A DAY WITH A MEAL 60 tablet 4  . cetirizine (ZYRTEC) 10 MG tablet Take 1 tablet (10 mg total) by mouth daily. 30 tablet 1  . ferrous sulfate 325 (65 FE) MG tablet Take 325 mg by mouth 2 (two) times daily.      . fluticasone (FLONASE) 50 MCG/ACT nasal spray Place 2 sprays into the nose daily as needed. For allergies    . glucose blood (ONE TOUCH ULTRA TEST) test strip Use 2x a day 100 each 2  . Insulin Glargine (TOUJEO SOLOSTAR) 300 UNIT/ML SOPN Inject 80 Units into the skin at bedtime. **PT NEEDS FOLLOW UP APPT FOR FURTHER REFILLS** 6 pen 1  . INVOKANA 100 MG TABS tablet TAKE 1 TABLET BY MOUTH EVERY MORNING 30 tablet 2  . KLOR-CON M20 20 MEQ tablet TAKE 1 TABLET BY MOUTH 2 TIMES A WEEK (TAKE WITH TORSEMIDE) 30 tablet 5  . levothyroxine (SYNTHROID, LEVOTHROID) 175 MCG tablet TAKE 1 TABLET BY MOUTH DAILY BEFORE BREAKFAST. 30 tablet 2  . metFORMIN (GLUCOPHAGE) 1000 MG tablet TAKE 1 TABLET BY MOUTH TWICE A DAY WITH A MEAL 180 tablet 0  . pantoprazole (PROTONIX) 40 MG tablet TAKE 1 TABLET BY MOUTH EVERY DAY 30 tablet 5  . sertraline (ZOLOFT) 100 MG tablet TAKE 1 TABLET BY MOUTH DAILY 30 tablet 5  . simvastatin (ZOCOR) 20 MG tablet TAKE 1 TABLET BY MOUTH EVERY EVENING 30 tablet 5  . sitaGLIPtin (JANUVIA) 100 MG tablet Take 1 tablet (100 mg total) by mouth daily. **PT NEEDS FOLLOW UP APPT WITH DR GHERGHE** 30 tablet 1  .  spironolactone (ALDACTONE) 25 MG tablet TAKE 1 TABLET (25 MG TOTAL) BY MOUTH DAILY. 30 tablet 1  . torsemide (DEMADEX) 20 MG tablet Take 20 mg by mouth 2 (two) times a week. Wed and Sat    . TRAVATAN Z 0.004 % SOLN ophthalmic solution Place 1 drop into both eyes at bedtime.     Marland Kitchen zolpidem (AMBIEN) 10 MG tablet TAKE 1/2-1 TABLET BY MOUTH AT BEDTIME AS NEEDED 30 tablet 1   No current facility-administered medications on file prior to visit.     Review of Systems Review of Systems  Constitutional: Negative for fever, appetite change, fatigue and unexpected weight change.  Eyes: Negative for pain and visual disturbance.  Respiratory: Negative for cough and shortness of breath.   Cardiovascular: Negative for cp or palpitations    Gastrointestinal: Negative for nausea, and constipation. pos for bloating and intermittent diarrhea and belching, pos for hemorroids with bleeding (GI appt upcoming) Genitourinary: Negative for urgency and frequency.  Skin: Negative for pallor or rash   Neurological: Negative for weakness, light-headedness, numbness and headaches.  Hematological: Negative for adenopathy. Does not bruise/bleed easily.  Psychiatric/Behavioral: Negative for dysphoric mood. The patient is anxious , pos for stressors        Objective:   Physical Exam  Constitutional: She appears well-developed and well-nourished. No distress.  Morbidly obese and well appearing   HENT:  Head: Normocephalic and atraumatic.  Right Ear: External ear normal.  Left Ear: External ear normal.  Nose: Nose normal.  Mouth/Throat: Oropharynx is clear and moist.  Eyes:  Conjunctivae and EOM are normal. Pupils are equal, round, and reactive to light. Right eye exhibits no discharge. Left eye exhibits no discharge. No scleral icterus.  Neck: Normal range of motion. Neck supple. No JVD present. Carotid bruit is not present. No thyromegaly present.  Cardiovascular: Normal rate, regular rhythm, normal heart sounds and  intact distal pulses.  Exam reveals no gallop.   Pulmonary/Chest: Effort normal and breath sounds normal. No respiratory distress. She has no wheezes. She has no rales.  No crackles   Abdominal: Soft. Bowel sounds are normal. She exhibits no distension and no mass. There is no tenderness.  Musculoskeletal: She exhibits no edema or tenderness.  Lymphadenopathy:    She has no cervical adenopathy.  Neurological: She is alert. She has normal reflexes. No cranial nerve deficit. She exhibits normal muscle tone. Coordination normal.  Skin: Skin is warm and dry. No rash noted. No erythema. No pallor.  Psychiatric: She has a normal mood and affect.  Tearful at times when discussing stressors  Pleasant and talkative  Supportive husband present           Assessment & Plan:   Problem List Items Addressed This Visit      Cardiovascular and Mediastinum   Chronic diastolic heart failure ([place])    Improved with cpap Uses demadex rarely for edema Reassuring exam today  Wt loss enc       Essential hypertension - Primary    bp in fair control at this time  BP Readings from Last 1 Encounters:  06/30/15 124/68   No changes needed Disc lifstyle change with low sodium diet and exercise  Labs reviewed Wt loss enc        Endocrine   Postsurgical hypothyroidism    Lab Results  Component Value Date   TSH 11.83* 06/16/2015   Pt will d/w her endocrinologist at f/u upcoming          Other   ANEMIA-IRON DEFICIENCY    Stable currently on iron  For f/u with GI soon for colonoscopy and also to disc GI symptoms       HYPERLIPIDEMIA    Disc goals for lipids and reasons to control them Rev labs with pt Rev low sat fat diet in detail Improved Commended on better diet choices      OBESITY, MORBID    Discussed how this problem influences overall health and the risks it imposes  Reviewed plan for weight loss with lower calorie diet (via better food choices and also portion control or  program like weight watchers) and exercise building up to or more than 30 minutes 5 days per week including some aerobic activity   Unfortunately pt has no time for self care due to full time work and care for husband and mother       Routine general medical examination at a health care facility    Reviewed health habits including diet and exercise and skin cancer prevention Reviewed appropriate screening tests for age  Also reviewed health mt list, fam hx and immunization status , as well as social and family history   See HPI Labs rev  Enc wt loss and self care (pt is too busy taking care of other people) Make sure you are getting 3 small meals per day with protein  See Dr Cruzita Lederer for thyroid and diabetes (your tsh is high)  Make sure to re schedule your eye procedure when you can  See the dermatologist every several years  Stay as  active as you can -start exercising when you get a chance

## 2015-07-02 ENCOUNTER — Other Ambulatory Visit: Payer: Self-pay | Admitting: Family Medicine

## 2015-07-04 ENCOUNTER — Encounter: Payer: Self-pay | Admitting: Internal Medicine

## 2015-07-04 ENCOUNTER — Ambulatory Visit (INDEPENDENT_AMBULATORY_CARE_PROVIDER_SITE_OTHER): Payer: BLUE CROSS/BLUE SHIELD | Admitting: Internal Medicine

## 2015-07-04 VITALS — BP 130/72 | HR 83 | Temp 98.6°F | Resp 12 | Wt 278.6 lb

## 2015-07-04 DIAGNOSIS — C73 Malignant neoplasm of thyroid gland: Secondary | ICD-10-CM

## 2015-07-04 DIAGNOSIS — E890 Postprocedural hypothyroidism: Secondary | ICD-10-CM | POA: Diagnosis not present

## 2015-07-04 DIAGNOSIS — E1165 Type 2 diabetes mellitus with hyperglycemia: Secondary | ICD-10-CM

## 2015-07-04 DIAGNOSIS — IMO0001 Reserved for inherently not codable concepts without codable children: Secondary | ICD-10-CM

## 2015-07-04 MED ORDER — SITAGLIPTIN PHOSPHATE 100 MG PO TABS: 100.0000 mg | ORAL_TABLET | Freq: Every day | ORAL | Status: DC

## 2015-07-04 MED ORDER — INSULIN GLARGINE 300 UNIT/ML ~~LOC~~ SOPN: 90.0000 [IU] | PEN_INJECTOR | Freq: Every day | SUBCUTANEOUS | Status: DC

## 2015-07-04 MED ORDER — CANAGLIFLOZIN 300 MG PO TABS: 300.0000 mg | ORAL_TABLET | Freq: Every day | ORAL | Status: DC

## 2015-07-04 MED ORDER — METFORMIN HCL 1000 MG PO TABS: ORAL_TABLET | ORAL | Status: DC

## 2015-07-04 NOTE — Progress Notes (Signed)
Patient ID: [name], female   DOB: 11-03-1965, 50 y.o.   MRN: [id number]  HPI: [name] is a 50 y.o.-year-old female, returning for f/u for DM2, dx 2008, insulin-dependent since ~2011, uncontrolled, with complications (peripheral neuropathy, dCHF). I am also following the pt for h/o papillary thyroid cancer and iatrogenic hypothyroidism. Last visit 8 mo ago.  DM2: Last hemoglobin A1c was: Lab Results  Component Value Date   HGBA1C 7.2* 06/16/2015   HGBA1C 8.2* 11/03/2014   HGBA1C 9.7* 12/20/2013    Pt is on a regimen of: - Lantus solostar >> Toujeo 80 >> 90 units qhs - Metformin 1000 mg bid  - started 11/2013 >> tolerates it well - Invokana 300 mg in am - started 12/2013 - Januvia 100 mg in am She was also on Glipizide but developed hypoglycemia >> loss of consciousness. She was also on Amaryl >> taken off.   Pt checks her sugars 1x a day: - am: 210-300 >> n/c >> 123-160 >> 170-220 >> 140-160 - before lunch: 116-163 >> n/c - before dinner: 113 >> n/c - bedtime: 210-300 >> 178, 225-240 >> 155-178, 211 >> n/c >> 160s No lows. Lowest sugar was 170 >> 120s; Highest sugar was 275 >> 190.  Pt's meals are: - Breakfast: skips - 10 o'clock: banana, apple, nectarine - Lunch: fast food - Dinner: goes out to eat around 8 o'clock: chicken sandwich or a wrap - Snacks: 0, no concentrated sweets  - no CKD, last BUN/creatinine:  Lab Results  Component Value Date   BUN 12 06/16/2015   CREATININE 0.73 06/16/2015  Last ACR was 3.4 on 10/20/2012. Not on ACEI/ARB.   - last set of lipids: Lab Results  Component Value Date   CHOL 147 06/16/2015   HDL 41.50 06/16/2015   LDLCALC 71 06/16/2015   LDLDIRECT 100.0 02/04/2011   TRIG 177.0* 06/16/2015   CHOLHDL 4 06/16/2015  She is on Simvastatin.  - last eye exam was in 09/2014. No DR. Has glaucoma - on drops.  - + numbness and tingling in her feet.  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 It was incidentally found that she had a subcentimeter focus of papillary thyroid cancer, follicular variant. No RAITx.  Most recent neck U/S (11/24/2014): Post thyroidectomy. No suspicious lymphadenopathy or soft tissue.  She was taking the Synthroid 175 mcg in am along with her PPI - now: - missing weekend LT4!!! - fasting - with water  - with Invokana - iron with lunch and bedtime - Protonix at bedtime - no MVI - no Calcium  Reviewed TSH levels: Lab Results  Component Value Date   TSH 11.83* 06/16/2015   TSH 1.82 11/03/2014   TSH 8.756* 03/24/2014   TSH 2.96 02/14/2014   TSH 29.85* 12/20/2013   TSH 1.86 09/04/2012   TSH 9.63* 07/27/2012   TSH 9.74 Repeated and verified X2.* 06/26/2012   TSH 24.58* 05/22/2012   TSH 1.528 01/18/2012   ROS: Constitutional: + weight gain, + fatigue, + subjective hyperthermia (hot flushes) Eyes: no blurry vision, no xerophthalmia ENT: no sore throat, no nodules palpated in throat, no dysphagia/odynophagia, no hoarseness Cardiovascular: no CP/no SOB/+ palpitations/no leg swelling Respiratory: no cough/no SOB/no wheezing Gastrointestinal: no N/V/D/C Musculoskeletal: + muscle/+ joint aches Skin: no rashes Neurological: no tremors/numbness/tingling/dizziness  I reviewed pt's medications, allergies, PMH, social hx, family hx, and changes were documented in the history of present illness. Otherwise, unchanged from  my initial visit note.  PE: BP 130/72 mmHg  Pulse 83  Temp(Src) 98.6 F (37 C) (Oral)  Resp 12  Wt 278 lb 9.6 oz (126.372 kg)  SpO2 97%  LMP 05/12/2015 Wt Readings from Last 3 Encounters:  07/04/15 278 lb 9.6 oz (126.372 kg)  06/30/15 274 lb 8 oz (124.512 kg)  11/03/14 268 lb 12.8 oz (121.927 kg)   Constitutional: obese, central obesity, in NAD Eyes: PERRLA, EOMI, no exophthalmos ENT: moist mucous membranes, no thyromegaly, no cervical lymphadenopathy Cardiovascular: RRR,  + 2/6 SEM no RG Respiratory: CTA B Gastrointestinal: abdomen soft, NT, ND, BS+ Musculoskeletal: no deformities, strength intact in all 4 Skin: moist, warm, no rashes  ASSESSMENT: 1. DM2, insulin-dependent, uncontrolled, without complications  2. Iatrogenic Hypothyroidism - h/o med noncompliance  3. 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 Patient with a h/o uncontrolled diabetes, on basal insulin regimen + metformin, with improvement after adding Invokana and Januvia >> HbA1c recently 7.2%! The only change that I suggested is to split Toujeo in 2 doses both taken at night. - I suggested to:  Patient Instructions  Please split Toujeo in 2 doses of 45 units at night. Continue: Metformin 1000 mg 2x a day. Invokana 300 mg in am. Januvia 100 mg in am, before breakfast.  Take the thyroid hormone every day, with water, at least 30 minutes before breakfast, separated by at least 4 hours from: - acid reflux medications - calcium - iron - multivitamins  Please return in 3 months with your sugar log.   - continue checking her sugars at different times of the day - check 2 times a day, rotating checks - advised for yearly eye exams >> she is up to date - Return to clinic in 1.5 month with sugar log - we will check a HbA1c today  2. Iatrogenic Hypothyroidism - recent TFTs with abnormal TSH: 11 on 05/2015. - reassessed how she is taking the LT4 >> she states she is skipping weekend doses >> advised to take it EVERY DAY - continue LT4 175 mcg daily >> will recheck at   3. Follicular PTC - neck U/S normal in 11/2014 - no neck compression sxs or masses palpated

## 2015-07-04 NOTE — Patient Instructions (Addendum)
Please split Toujeo in 2 doses of 45 units at night. Continue: Metformin 1000 mg 2x a day. Invokana 300 mg in am. Januvia 100 mg in am, before breakfast.  Take the thyroid hormone every day, with water, at least 30 minutes before breakfast, separated by at least 4 hours from: - acid reflux medications - calcium - iron - multivitamins  Please return in 3 months with your sugar log.

## 2015-07-17 ENCOUNTER — Other Ambulatory Visit: Payer: Self-pay | Admitting: Family Medicine

## 2015-07-17 NOTE — Telephone Encounter (Signed)
Rout to Endo doc

## 2015-07-22 ENCOUNTER — Encounter (HOSPITAL_COMMUNITY): Payer: Self-pay | Admitting: *Deleted

## 2015-07-22 DIAGNOSIS — I509 Heart failure, unspecified: Secondary | ICD-10-CM | POA: Insufficient documentation

## 2015-07-22 DIAGNOSIS — Z7984 Long term (current) use of oral hypoglycemic drugs: Secondary | ICD-10-CM | POA: Insufficient documentation

## 2015-07-22 DIAGNOSIS — G4700 Insomnia, unspecified: Secondary | ICD-10-CM | POA: Insufficient documentation

## 2015-07-22 DIAGNOSIS — E785 Hyperlipidemia, unspecified: Secondary | ICD-10-CM | POA: Diagnosis not present

## 2015-07-22 DIAGNOSIS — Z3202 Encounter for pregnancy test, result negative: Secondary | ICD-10-CM | POA: Insufficient documentation

## 2015-07-22 DIAGNOSIS — F329 Major depressive disorder, single episode, unspecified: Secondary | ICD-10-CM | POA: Insufficient documentation

## 2015-07-22 DIAGNOSIS — Z794 Long term (current) use of insulin: Secondary | ICD-10-CM | POA: Diagnosis not present

## 2015-07-22 DIAGNOSIS — R1084 Generalized abdominal pain: Secondary | ICD-10-CM | POA: Insufficient documentation

## 2015-07-22 DIAGNOSIS — E0590 Thyrotoxicosis, unspecified without thyrotoxic crisis or storm: Secondary | ICD-10-CM | POA: Insufficient documentation

## 2015-07-22 DIAGNOSIS — Z85850 Personal history of malignant neoplasm of thyroid: Secondary | ICD-10-CM | POA: Diagnosis not present

## 2015-07-22 DIAGNOSIS — K219 Gastro-esophageal reflux disease without esophagitis: Secondary | ICD-10-CM | POA: Diagnosis not present

## 2015-07-22 DIAGNOSIS — D649 Anemia, unspecified: Secondary | ICD-10-CM | POA: Insufficient documentation

## 2015-07-22 DIAGNOSIS — N189 Chronic kidney disease, unspecified: Secondary | ICD-10-CM | POA: Insufficient documentation

## 2015-07-22 DIAGNOSIS — R109 Unspecified abdominal pain: Secondary | ICD-10-CM | POA: Diagnosis present

## 2015-07-22 DIAGNOSIS — R112 Nausea with vomiting, unspecified: Secondary | ICD-10-CM | POA: Insufficient documentation

## 2015-07-22 DIAGNOSIS — I129 Hypertensive chronic kidney disease with stage 1 through stage 4 chronic kidney disease, or unspecified chronic kidney disease: Secondary | ICD-10-CM | POA: Insufficient documentation

## 2015-07-22 DIAGNOSIS — M1990 Unspecified osteoarthritis, unspecified site: Secondary | ICD-10-CM | POA: Insufficient documentation

## 2015-07-22 DIAGNOSIS — F419 Anxiety disorder, unspecified: Secondary | ICD-10-CM | POA: Insufficient documentation

## 2015-07-22 DIAGNOSIS — J45909 Unspecified asthma, uncomplicated: Secondary | ICD-10-CM | POA: Insufficient documentation

## 2015-07-22 DIAGNOSIS — Z872 Personal history of diseases of the skin and subcutaneous tissue: Secondary | ICD-10-CM | POA: Diagnosis not present

## 2015-07-22 DIAGNOSIS — E119 Type 2 diabetes mellitus without complications: Secondary | ICD-10-CM | POA: Diagnosis not present

## 2015-07-22 DIAGNOSIS — R197 Diarrhea, unspecified: Secondary | ICD-10-CM | POA: Diagnosis not present

## 2015-07-22 LAB — CBG MONITORING, ED: GLUCOSE-CAPILLARY: 220 mg/dL — AB (ref 65–99)

## 2015-07-22 LAB — COMPREHENSIVE METABOLIC PANEL
ALBUMIN: 4.9 g/dL (ref 3.5–5.0)
ALK PHOS: 89 U/L (ref 38–126)
ALT: 21 U/L (ref 14–54)
AST: 18 U/L (ref 15–41)
Anion gap: 16 — ABNORMAL HIGH (ref 5–15)
BUN: 18 mg/dL (ref 6–20)
CALCIUM: 10.4 mg/dL — AB (ref 8.9–10.3)
CO2: 19 mmol/L — AB (ref 22–32)
CREATININE: 1.13 mg/dL — AB (ref 0.44–1.00)
Chloride: 102 mmol/L (ref 101–111)
GFR calc Af Amer: >60 mL/min (ref >60)
GFR calc non Af Amer: 56 mL/min — ABNORMAL LOW (ref >60)
GLUCOSE: 198 mg/dL — AB (ref 65–99)
Potassium: 4.3 mmol/L (ref 3.5–5.1)
SODIUM: 137 mmol/L (ref 135–145)
Total Bilirubin: 0.6 mg/dL (ref 0.3–1.2)
Total Protein: 8.8 g/dL — ABNORMAL HIGH (ref 6.5–8.1)

## 2015-07-22 LAB — CBC
HCT: 46.3 % — ABNORMAL HIGH (ref 36.0–46.0)
HEMOGLOBIN: 14.2 g/dL (ref 12.0–15.0)
MCH: 27.7 pg (ref 26.0–34.0)
MCHC: 30.7 g/dL (ref 30.0–36.0)
MCV: 90.4 fL (ref 78.0–100.0)
Platelets: 359 10*3/uL (ref 150–400)
RBC: 5.12 MIL/uL — ABNORMAL HIGH (ref 3.87–5.11)
RDW: 15.2 % (ref 11.5–15.5)
WBC: 11.9 10*3/uL — ABNORMAL HIGH (ref 4.0–10.5)

## 2015-07-22 LAB — LIPASE, BLOOD: Lipase: 21 U/L (ref 11–51)

## 2015-07-22 MED ORDER — ONDANSETRON 4 MG PO TBDP
8.0000 mg | ORAL_TABLET | Freq: Once | ORAL | Status: AC
  Administered 2015-07-22: 8 mg via ORAL
  Filled 2015-07-22: qty 2

## 2015-07-22 NOTE — ED Notes (Signed)
coulod not void

## 2015-07-22 NOTE — ED Notes (Signed)
CBG was 220. RN notified

## 2015-07-22 NOTE — ED Notes (Signed)
The pt is c/o abd cramps with nv since 0600am today..  Diabetic insulin dependent  She cannot hold any of her meds down

## 2015-07-23 ENCOUNTER — Emergency Department (HOSPITAL_COMMUNITY)
Admission: EM | Admit: 2015-07-23 | Discharge: 2015-07-23 | Disposition: A | Discharge status: Home / Self Care | Payer: BLUE CROSS/BLUE SHIELD | Attending: Emergency Medicine | Admitting: Emergency Medicine

## 2015-07-23 ENCOUNTER — Emergency Department (HOSPITAL_COMMUNITY): Payer: BLUE CROSS/BLUE SHIELD

## 2015-07-23 DIAGNOSIS — R112 Nausea with vomiting, unspecified: Secondary | ICD-10-CM

## 2015-07-23 DIAGNOSIS — R197 Diarrhea, unspecified: Secondary | ICD-10-CM

## 2015-07-23 DIAGNOSIS — R1084 Generalized abdominal pain: Secondary | ICD-10-CM

## 2015-07-23 LAB — URINALYSIS, ROUTINE W REFLEX MICROSCOPIC
Ketones, ur: NEGATIVE mg/dL
Nitrite: NEGATIVE
PROTEIN: 30 mg/dL — AB
SPECIFIC GRAVITY, URINE: 1.039 — AB (ref 1.005–1.030)
pH: 5.5 (ref 5.0–8.0)

## 2015-07-23 LAB — URINE MICROSCOPIC-ADD ON: RBC / HPF: NONE SEEN RBC/hpf (ref 0–5)

## 2015-07-23 LAB — CBG MONITORING, ED: Glucose-Capillary: 213 mg/dL — ABNORMAL HIGH (ref 65–99)

## 2015-07-23 LAB — POC URINE PREG, ED: PREG TEST UR: NEGATIVE

## 2015-07-23 MED ORDER — LOPERAMIDE HCL 2 MG PO CAPS: 4.0000 mg | ORAL_CAPSULE | Freq: Every evening | ORAL | Status: DC | PRN

## 2015-07-23 MED ORDER — DIPHENHYDRAMINE HCL 50 MG/ML IJ SOLN
25.0000 mg | Freq: Once | INTRAMUSCULAR | Status: AC | PRN
  Administered 2015-07-23: 25 mg via INTRAVENOUS
  Filled 2015-07-23: qty 1

## 2015-07-23 MED ORDER — HYDROMORPHONE HCL 1 MG/ML IJ SOLN
1.0000 mg | Freq: Once | INTRAMUSCULAR | Status: AC
  Administered 2015-07-23: 1 mg via INTRAVENOUS
  Filled 2015-07-23: qty 1

## 2015-07-23 MED ORDER — ONDANSETRON 8 MG PO TBDP: 8.0000 mg | ORAL_TABLET | Freq: Three times a day (TID) | ORAL | Status: DC | PRN

## 2015-07-23 MED ORDER — ONDANSETRON HCL 4 MG/2ML IJ SOLN
4.0000 mg | Freq: Once | INTRAMUSCULAR | Status: AC
  Administered 2015-07-23: 4 mg via INTRAVENOUS
  Filled 2015-07-23: qty 2

## 2015-07-23 NOTE — ED Notes (Signed)
Patient transported to CT 

## 2015-07-23 NOTE — Discharge Instructions (Signed)
Nausea and Vomiting Nausea is a sick feeling that often comes before throwing up (vomiting). Vomiting is a reflex where stomach contents come out of your mouth. Vomiting can cause severe loss of body fluids (dehydration). Children and elderly adults can become dehydrated quickly, especially if they also have diarrhea. Nausea and vomiting are symptoms of a condition or disease. It is important to find the cause of your symptoms. CAUSES   Direct irritation of the stomach lining. This irritation can result from increased acid production (gastroesophageal reflux disease), infection, food poisoning, taking certain medicines (such as nonsteroidal anti-inflammatory drugs), alcohol use, or tobacco use.  Signals from the brain.These signals could be caused by a headache, heat exposure, an inner ear disturbance, increased pressure in the brain from injury, infection, a tumor, or a concussion, pain, emotional stimulus, or metabolic problems.  An obstruction in the gastrointestinal tract (bowel obstruction).  Illnesses such as diabetes, hepatitis, gallbladder problems, appendicitis, kidney problems, cancer, sepsis, atypical symptoms of a heart attack, or eating disorders.  Medical treatments such as chemotherapy and radiation.  Receiving medicine that makes you sleep (general anesthetic) during surgery. DIAGNOSIS Your caregiver may ask for tests to be done if the problems do not improve after a few days. Tests may also be done if symptoms are severe or if the reason for the nausea and vomiting is not clear. Tests may include:  Urine tests.  Blood tests.  Stool tests.  Cultures (to look for evidence of infection).  X-rays or other imaging studies. Test results can help your caregiver make decisions about treatment or the need for additional tests. TREATMENT You need to stay well hydrated. Drink frequently but in small amounts.You may wish to drink water, sports drinks, clear broth, or eat frozen  ice pops or gelatin dessert to help stay hydrated.When you eat, eating slowly may help prevent nausea.There are also some antinausea medicines that may help prevent nausea. HOME CARE INSTRUCTIONS   Take all medicine as directed by your caregiver.  If you do not have an appetite, do not force yourself to eat. However, you must continue to drink fluids.  If you have an appetite, eat a normal diet unless your caregiver tells you differently.  Eat a variety of complex carbohydrates (rice, wheat, potatoes, bread), lean meats, yogurt, fruits, and vegetables.  Avoid high-fat foods because they are more difficult to digest.  Drink enough water and fluids to keep your urine clear or pale yellow.  If you are dehydrated, ask your caregiver for specific rehydration instructions. Signs of dehydration may include:  Severe thirst.  Dry lips and mouth.  Dizziness.  Dark urine.  Decreasing urine frequency and amount.  Confusion.  Rapid breathing or pulse. SEEK IMMEDIATE MEDICAL CARE IF:   You have blood or brown flecks (like coffee grounds) in your vomit.  You have black or bloody stools.  You have a severe headache or stiff neck.  You are confused.  You have severe abdominal pain.  You have chest pain or trouble breathing.  You do not urinate at least once every 8 hours.  You develop cold or clammy skin.  You continue to vomit for longer than 24 to 48 hours.  You have a fever. MAKE SURE YOU:   Understand these instructions.  Will watch your condition.  Will get help right away if you are not doing well or get worse.   This information is not intended to replace advice given to you by your health care provider. Make sure  you discuss any questions you have with your health care provider.   Document Released: 03/11/2005 Document Revised: 06/03/2011 Document Reviewed: 08/08/2010 Elsevier Interactive Patient Education 2016 [employer]. Viral Gastroenteritis Viral  gastroenteritis is also known as stomach flu. This condition affects the stomach and intestinal tract. It can cause sudden diarrhea and vomiting. The illness typically lasts 3 to 8 days. Most people develop an immune response that eventually gets rid of the virus. While this natural response develops, the virus can make you quite ill. CAUSES  Many different viruses can cause gastroenteritis, such as rotavirus or noroviruses. You can catch one of these viruses by consuming contaminated food or water. You may also catch a virus by sharing utensils or other personal items with an infected person or by touching a contaminated surface. SYMPTOMS  The most common symptoms are diarrhea and vomiting. These problems can cause a severe loss of body fluids (dehydration) and a body salt (electrolyte) imbalance. Other symptoms may include:  Fever.  Headache.  Fatigue.  Abdominal pain. DIAGNOSIS  Your caregiver can usually diagnose viral gastroenteritis based on your symptoms and a physical exam. A stool sample may also be taken to test for the presence of viruses or other infections. TREATMENT  This illness typically goes away on its own. Treatments are aimed at rehydration. The most serious cases of viral gastroenteritis involve vomiting so severely that you are not able to keep fluids down. In these cases, fluids must be given through an intravenous line (IV). HOME CARE INSTRUCTIONS   Drink enough fluids to keep your urine clear or pale yellow. Drink small amounts of fluids frequently and increase the amounts as tolerated.  Ask your caregiver for specific rehydration instructions.  Avoid:  Foods high in sugar.  Alcohol.  Carbonated drinks.  Tobacco.  Juice.  Caffeine drinks.  Extremely hot or cold fluids.  Fatty, greasy foods.  Too much intake of anything at one time.  Dairy products until 24 to 48 hours after diarrhea stops.  You may consume probiotics. Probiotics are active  cultures of beneficial bacteria. They may lessen the amount and number of diarrheal stools in adults. Probiotics can be found in yogurt with active cultures and in supplements.  Wash your hands well to avoid spreading the virus.  Only take over-the-counter or prescription medicines for pain, discomfort, or fever as directed by your caregiver. Do not give aspirin to children. Antidiarrheal medicines are not recommended.  Ask your caregiver if you should continue to take your regular prescribed and over-the-counter medicines.  Keep all follow-up appointments as directed by your caregiver. SEEK IMMEDIATE MEDICAL CARE IF:   You are unable to keep fluids down.  You do not urinate at least once every 6 to 8 hours.  You develop shortness of breath.  You notice blood in your stool or vomit. This may look like coffee grounds.  You have abdominal pain that increases or is concentrated in one small area (localized).  You have persistent vomiting or diarrhea.  You have a fever.  The patient is a child younger than 3 months, and he or she has a fever.  The patient is a child older than 3 months, and he or she has a fever and persistent symptoms.  The patient is a child older than 3 months, and he or she has a fever and symptoms suddenly get worse.  The patient is a baby, and he or she has no tears when crying. MAKE SURE YOU:   Understand these  instructions.  Will watch your condition.  Will get help right away if you are not doing well or get worse.   This information is not intended to replace advice given to you by your health care provider. Make sure you discuss any questions you have with your health care provider.   Document Released: 03/11/2005 Document Revised: 06/03/2011 Document Reviewed: 12/26/2010 Elsevier Interactive Patient Education [employer].

## 2015-07-23 NOTE — ED Provider Notes (Addendum)
CSN: [id number]     Arrival date & time 07/22/15  2125 History  By signing my name below, I, [facility], attest that this documentation has been prepared under the direction and in the presence of Varney Biles, MD. Electronically Signed: Virgel Bouquet, ED Scribe. 07/23/2015. 3:52 AM   Chief Complaint  Patient presents with  . Abdominal Pain   The history is provided by the patient. No language interpreter was used.   HPI Comments: [name] is a 50 y.o. female with hx of DM and cholecystectomy who presents to the Emergency Department complaining of constant, cramping, lower abdominal pain onset last night after eating. Patient states that she ate at a restaurant last night followed by abdominal pain and burping that smelled like feces. She reports nausea, watery diarrhea x12 today, vomiting x5 since 6 AM, sleep disturbance, HA since arrival. She has not been eating or drinking secondary to emesis. Per pt, she has been unable to take her prescription medications secondary to emesis. Denies hx of Cesarean section, kidney stones. Denies any other symptoms currently.  Past Medical History  Diagnosis Date  . Anemia     iron deficiency  . Asthma     a. 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  . Depression     a. Stress reaction 08/2011 in multiple social stressors.  . Diabetes mellitus   . Hypertension   . Morbid obesity ([place])   . Hyperlipidemia   . Vertigo   . PCOS (polycystic ovarian syndrome)   . Gastritis   . Insomnia   . GERD (gastroesophageal reflux disease)   . OSA (obstructive sleep apnea) 01/17/2012  . CHF (congestive heart failure) ([place])   . Dysrhythmia   . Hyperthyroidism   . Anxiety   . Chronic kidney disease     /13  . Hemorrhoid   . Arthritis   . Complication of anesthesia     DIFFICULT WAKING- 2011  . Papillary thyroid carcinoma ([place])   . Cellulitis and abscess of leg 02/2014   Past Surgical History   Procedure Laterality Date  . Cholecystectomy    . Hemorrhoid surgery    . Tubal ligation    . Shoulder surgery  06/2009  . Tonsillectomy and adenoidectomy  CHILD  . Intrauterine device insertion  2011  . Thyroidectomy  04/10/2012    Procedure: THYROIDECTOMY;  Surgeon: Jerrell Belfast, MD;  Location: [facility];  Service: ENT;  Laterality: N/A;  Total Thyroidectomy   Family History  Problem Relation Age of Onset  . Cancer Father     colon cancer  . Arthritis Father   . Arthritis Mother   . Asthma Mother   . Hypertension Mother    Social History  Substance Use Topics  . Smoking status: Former Smoker -- 0.20 packs/day for 5 years    Quit date: 03/25/2004  . Smokeless tobacco: Never Used  . Alcohol Use: No   OB History    No data available     Review of Systems A complete 10 system review of systems was obtained and all systems are negative except as noted in the HPI and PMH.   Allergies  Tape; Buspirone hcl; Glipizide; Ivp dye; and Oxycodone-acetaminophen  Home Medications   Prior to Admission medications   Medication Sig Start Date End Date Taking? Authorizing Provider  acetaminophen (TYLENOL) 325 MG tablet Take 1 tablet (325 mg total) by mouth every 4 (four) hours as needed (pain/fever). 03/29/14  Yes Delfina Redwood, MD  albuterol (PROAIR HFA) 108 (90 BASE) MCG/ACT inhaler Inhale 2 puffs into the lungs every 4 (four) hours as needed. For wheezing 02/13/12  Yes Tammy S Parrett, NP  B-D ULTRAFINE III SHORT PEN 31G X 8 MM MISC USE ONCE A DAY 07/17/15  Yes Philemon Kingdom, MD  buPROPion (WELLBUTRIN XL) 150 MG 24 hr tablet TAKE 1 TABLET BY MOUTH DAILY 01/06/15  Yes [name], MD  canagliflozin (INVOKANA) 300 MG TABS tablet Take 1 tablet (300 mg total) by mouth daily before breakfast. 07/04/15  Yes Philemon Kingdom, MD  carvedilol (COREG) 3.125 MG tablet TAKE 1 TABLET BY MOUTH TWICE A DAY WITH A MEAL 07/03/15  Yes [name], MD  cetirizine (ZYRTEC) 10 MG tablet TAKE 1  TABLET (10 MG TOTAL) BY MOUTH DAILY. 07/03/15  Yes [name], MD  ferrous sulfate 325 (65 FE) MG tablet Take 325 mg by mouth 2 (two) times daily.     Yes Historical Provider, MD  fluticasone (FLONASE) 50 MCG/ACT nasal spray Place 2 sprays into the nose daily as needed for allergies.    Yes Historical Provider, MD  glucose blood (ONE TOUCH ULTRA TEST) test strip Use 2x a day 02/14/14  Yes Philemon Kingdom, MD  Insulin Glargine (TOUJEO SOLOSTAR) 300 UNIT/ML SOPN Inject 90 Units into the skin at bedtime. 07/04/15  Yes Philemon Kingdom, MD  KLOR-CON M20 20 MEQ tablet TAKE 1 TABLET BY MOUTH 2 TIMES A WEEK (TAKE WITH TORSEMIDE) Patient taking differently: TAKE 1 TABLET BY MOUTH DAILY AS NEEDED WHEN TAKING TORSEMIDE 06/02/14  Yes Shaune Pascal Bensimhon, MD  levothyroxine (SYNTHROID, LEVOTHROID) 175 MCG tablet TAKE 1 TABLET BY MOUTH DAILY BEFORE BREAKFAST. 04/26/15  Yes Philemon Kingdom, MD  metFORMIN (GLUCOPHAGE) 1000 MG tablet TAKE 1 TABLET BY MOUTH TWICE A DAY WITH A MEAL 07/04/15  Yes Philemon Kingdom, MD  pantoprazole (PROTONIX) 40 MG tablet TAKE 1 TABLET BY MOUTH EVERY DAY 01/06/15  Yes [name], MD  sertraline (ZOLOFT) 100 MG tablet TAKE 1 TABLET BY MOUTH DAILY 07/03/15  Yes [name], MD  simvastatin (ZOCOR) 20 MG tablet TAKE 1 TABLET BY MOUTH EVERY EVENING 07/03/15  Yes Marne A Tower, MD  sitaGLIPtin (JANUVIA) 100 MG tablet Take 1 tablet (100 mg total) by mouth daily. 07/04/15  Yes Philemon Kingdom, MD  spironolactone (ALDACTONE) 25 MG tablet TAKE 1 TABLET (25 MG TOTAL) BY MOUTH DAILY. 05/17/15  Yes Jolaine Artist, MD  torsemide (DEMADEX) 20 MG tablet Take 20 mg by mouth daily as needed (fluid).  02/03/13  Yes Amy D Clegg, NP  TRAVATAN Z 0.004 % SOLN ophthalmic solution Place 1 drop into both eyes at bedtime.  11/21/12  Yes Historical Provider, MD  zolpidem (AMBIEN) 10 MG tablet TAKE 1/2-1 TABLET BY MOUTH AT BEDTIME AS NEEDED Patient taking differently: TAKE 1/2-1 TABLET BY MOUTH AT BEDTIME  05/18/15  Yes [name], MD  loperamide (IMODIUM) 2 MG capsule Take 2 capsules (4 mg total) by mouth at bedtime as needed for diarrhea or loose stools. 07/23/15   Varney Biles, MD  ondansetron (ZOFRAN ODT) 8 MG disintegrating tablet Take 1 tablet (8 mg total) by mouth every 8 (eight) hours as needed for nausea. 07/23/15   Selassie Spatafore Kathrynn Humble, MD   BP 134/82 mmHg  Pulse 75  Temp(Src) 98.8 F (37.1 C) (Oral)  Resp 16  Ht 5\' 4"  (1.626 m)  Wt 262 lb (118.842 kg)  BMI 44.95 kg/m2  SpO2 96%  LMP 06/09/2015 Physical Exam  Constitutional: She is oriented to person, place, and time. She appears well-developed and well-nourished.  HENT:  Head: Normocephalic.  Mouth/Throat: Mucous membranes are dry.  Eyes: EOM are normal.  Neck: Normal range of motion.  Cardiovascular: Normal rate and regular rhythm.   Pulmonary/Chest: Effort normal.  Lungs CTA bilaterally.  Abdominal: She exhibits no distension. There is tenderness.  Generalized abdominal tenderness, worse in RLQ.  Musculoskeletal: Normal range of motion.  Neurological: She is alert and oriented to person, place, and time.  Psychiatric: She has a normal mood and affect.  Nursing note and vitals reviewed.   ED Course  Procedures   DIAGNOSTIC STUDIES: Oxygen Saturation is 96% on RA, adequate by my interpretation.    COORDINATION OF CARE: 12:46 AM Will order CT abdominal scan, Zofran, Dilaudid. Discussed treatment plan with pt at bedside and pt agreed to plan.   Labs Review Labs Reviewed  COMPREHENSIVE METABOLIC PANEL - Abnormal; Notable for the following:    CO2 19 (*)    Glucose, Bld 198 (*)    Creatinine, Ser 1.13 (*)    Calcium 10.4 (*)    Total Protein 8.8 (*)    GFR calc non Af Amer 56 (*)    Anion gap 16 (*)    All other components within normal limits  CBC - Abnormal; Notable for the following:    WBC 11.9 (*)    RBC 5.12 (*)    HCT 46.3 (*)    All other components within normal limits  URINALYSIS, ROUTINE W REFLEX  MICROSCOPIC (NOT AT [facility]) - Abnormal; Notable for the following:    Color, Urine AMBER (*)    APPearance TURBID (*)    Specific Gravity, Urine 1.039 (*)    Glucose, UA >1000 (*)    Hgb urine dipstick SMALL (*)    Bilirubin Urine SMALL (*)    Protein, ur 30 (*)    Leukocytes, UA SMALL (*)    All other components within normal limits  URINE MICROSCOPIC-ADD ON - Abnormal; Notable for the following:    Squamous Epithelial / LPF 0-5 (*)    Bacteria, UA RARE (*)    Casts HYALINE CASTS (*)    All other components within normal limits  CBG MONITORING, ED - Abnormal; Notable for the following:    Glucose-Capillary 220 (*)    All other components within normal limits  CBG MONITORING, ED - Abnormal; Notable for the following:    Glucose-Capillary 213 (*)    All other components within normal limits  LIPASE, BLOOD  POC URINE PREG, ED  POC URINE PREG, ED    Imaging Review Ct Abdomen Pelvis Wo Contrast  07/23/2015  CLINICAL DATA:  Generalized abdominal pain. History of hyper tension, diabetes, cholecystectomy, morbid obesity. EXAM: CT ABDOMEN AND PELVIS WITHOUT CONTRAST TECHNIQUE: Multidetector CT imaging of the abdomen and pelvis was performed following the standard protocol without IV contrast. COMPARISON:  Abdominal ultrasound January 26, 2012 FINDINGS: Large body habitus results in overall noisy image quality. LUNG BASES: Included view of the lung bases are clear. The visualized heart and pericardium are unremarkable. KIDNEYS/BLADDER: Kidneys are orthotopic, demonstrating normal size and morphology. No nephrolithiasis, hydronephrosis; limited assessment for renal masses on this nonenhanced examination. The unopacified ureters are normal in course and caliber. Urinary bladder is decompressed and unremarkable. SOLID ORGANS: The liver, spleen, pancreas and adrenal glands are unremarkable for this non-contrast examination. Status post cholecystectomy. GASTROINTESTINAL TRACT: The stomach is  decompressed. Small and large  bowel are normal in course and caliber without inflammatory changes though sensitivity is decreased by lack of oral contrast. Normal appendix. PERITONEUM/RETROPERITONEUM: Aortoiliac vessels are normal in course and caliber. No lymphadenopathy by CT size criteria. Multiple small central mesenteric lymph nodes are likely reactive with "misty" mesentery. Internal reproductive organs are unremarkable. No intraperitoneal free fluid nor free air. SOFT TISSUES/ OSSEOUS STRUCTURES: Nonsuspicious. Moderate sacroiliac osteoarthrosis. Severe L5-S1 disc height loss with large broad-based disc bulge. Moderate to severe lower lumbar facet arthropathy. Moderate to severe bilateral L5-S1 neural foraminal narrowing IMPRESSION: No urolithiasis, obstructive uropathy or acute intra-abdominal/pelvic process. Electronically Signed   By: Elon Alas M.D.   On: 07/23/2015 04:45   I have personally reviewed and evaluated these images and lab results as part of my medical decision-making.   MDM   Final diagnoses:  Generalized abdominal pain  Non-intractable vomiting with nausea, vomiting of unspecified type  Diarrhea, unspecified type    I personally performed the services described in this documentation, which was scribed in my presence. The recorded information has been reviewed and is accurate.  Pt comes in with cc of abd pain. PT has abd discomfort, non focal - with nausea, emesis, diarrhea. She has a little bit of dehydration appearng exam and mild Cr bump. Clinical concern is for partial SBO mostly and thus CT ordered. If CT is neg, will start oral challenge.   Varney Biles, MD 07/23/15 0428  Pain has improved. PO challenge passed.  Strict ER return precautions have been discussed, and patient is agreeing with the plan and is comfortable with the workup done and the recommendations from the ER.   Varney Biles, MD 07/23/15 [phone]

## 2015-07-30 ENCOUNTER — Other Ambulatory Visit: Payer: Self-pay | Admitting: Family Medicine

## 2015-08-09 ENCOUNTER — Ambulatory Visit (INDEPENDENT_AMBULATORY_CARE_PROVIDER_SITE_OTHER): Payer: BLUE CROSS/BLUE SHIELD | Admitting: Gastroenterology

## 2015-08-09 ENCOUNTER — Encounter: Payer: Self-pay | Admitting: Gastroenterology

## 2015-08-09 VITALS — BP 136/78 | HR 58 | Ht 64.75 in | Wt 276.0 lb

## 2015-08-09 DIAGNOSIS — K589 Irritable bowel syndrome without diarrhea: Secondary | ICD-10-CM

## 2015-08-09 DIAGNOSIS — Z800 Family history of malignant neoplasm of digestive organs: Secondary | ICD-10-CM | POA: Diagnosis not present

## 2015-08-09 MED ORDER — NA SULFATE-K SULFATE-MG SULF 17.5-3.13-1.6 GM/177ML PO SOLN: 1.0000 | Freq: Once | ORAL | Status: DC

## 2015-08-09 NOTE — Patient Instructions (Signed)
You will be set up for a colonoscopy for FH of colon cancer. Will examine your hemorrhoids. If a good candidate, will refer back for hemorrhoidal banding. Try 2 gas-ex pills with episodes of anxiety, bloating. Consider on imodium those nights as well.

## 2015-08-09 NOTE — Progress Notes (Signed)
Review of pertinent gastrointestinal problems: 1. Family history of colon cancer, personal history of adenomatous colon polyps. Her father had colon cancer; diagnosed in his late 16s. Colonoscopy 10 2011 [name] found to subcentimeter adenomas, hemorrhoids. She was recommended to have repeat colonoscopy at five-year interval. 2. Dyspepsia, functional ?: EGD [name] 10 2011 found mild nonspecific gastritis that was H. pylori negative.  HPI: This is a    very pleasant 50 year old woman    who was referred to me by Monica Greenspan, MD  to evaluate  abdominal pain, hemorrhoidal bleeding, intermittent diarrhea .    Chief complaint is abdominal pain, diarrhea, rectal bleeding  Under a lot of new stress.  She had hemorrhoids; banded [name] many years ago.  She sees red blood daily, sometimes dark.  Often has to push, strain to move her bowels.  If she gets upset, nervous, acutely stressed then she gets gas like abdominal pains, bloating.  Cannot release it, and has to belch the air out, very foul smelling.  Laying down can help.  The next day she has diarrhea.  Only happens with acute stress, being spooked.  She has not noticed any foods that related.  She has cousins with crohn's and a sister with colitis.   Review of systems: Pertinent positive and negative review of systems were noted in the above HPI section. Complete review of systems was performed and was otherwise normal.   Past Medical History  Diagnosis Date  . Anemia     iron deficiency  . Asthma     a. 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  . Depression     a. Stress reaction 08/2011 in multiple social stressors.  . Diabetes mellitus   . Hypertension   . Morbid obesity ([place])   . Hyperlipidemia   . Vertigo   . PCOS (polycystic ovarian syndrome)   . Gastritis   . Insomnia   . GERD (gastroesophageal reflux disease)   . OSA (obstructive sleep apnea) 01/17/2012  . CHF  (congestive heart failure) ([place])   . Dysrhythmia   . Hyperthyroidism   . Anxiety   . Chronic kidney disease     /13  . Hemorrhoid   . Arthritis   . Complication of anesthesia     DIFFICULT WAKING- 2011  . Papillary thyroid carcinoma ([place])   . Cellulitis and abscess of leg 02/2014    Past Surgical History  Procedure Laterality Date  . Cholecystectomy    . Hemorrhoid surgery    . Tubal ligation    . Shoulder surgery  06/2009  . Tonsillectomy and adenoidectomy  CHILD  . Intrauterine device insertion  2011  . Thyroidectomy  04/10/2012    Procedure: THYROIDECTOMY;  Surgeon: Jerrell Belfast, MD;  Location: [facility];  Service: ENT;  Laterality: N/A;  Total Thyroidectomy    Current Outpatient Prescriptions  Medication Sig Dispense Refill  . acetaminophen (TYLENOL) 325 MG tablet Take 1 tablet (325 mg total) by mouth every 4 (four) hours as needed (pain/fever).    Marland Kitchen albuterol (PROAIR HFA) 108 (90 BASE) MCG/ACT inhaler Inhale 2 puffs into the lungs every 4 (four) hours as needed. For wheezing 1 Inhaler 4  . B-D ULTRAFINE III SHORT PEN 31G X 8 MM MISC USE ONCE A DAY 100 each 1  . buPROPion (WELLBUTRIN XL) 150 MG 24 hr tablet TAKE 1 TABLET BY MOUTH DAILY 30 tablet 5  . canagliflozin (INVOKANA) 300 MG TABS tablet  Take 1 tablet (300 mg total) by mouth daily before breakfast. 30 tablet 5  . carvedilol (COREG) 3.125 MG tablet TAKE 1 TABLET BY MOUTH TWICE A DAY WITH A MEAL 60 tablet 11  . cetirizine (ZYRTEC) 10 MG tablet TAKE 1 TABLET (10 MG TOTAL) BY MOUTH DAILY. 30 tablet 11  . ferrous sulfate 325 (65 FE) MG tablet Take 325 mg by mouth 2 (two) times daily.      . fluticasone (FLONASE) 50 MCG/ACT nasal spray Place 2 sprays into the nose daily as needed for allergies.     Marland Kitchen glucose blood (ONE TOUCH ULTRA TEST) test strip Use 2x a day 100 each 2  . Insulin Glargine (TOUJEO SOLOSTAR) 300 UNIT/ML SOPN Inject 90 Units into the skin at bedtime. 18 pen 1  . KLOR-CON M20 20 MEQ tablet TAKE 1 TABLET BY  MOUTH 2 TIMES A WEEK (TAKE WITH TORSEMIDE) (Patient taking differently: TAKE 1 TABLET BY MOUTH DAILY AS NEEDED WHEN TAKING TORSEMIDE) 30 tablet 5  . levothyroxine (SYNTHROID, LEVOTHROID) 175 MCG tablet TAKE 1 TABLET BY MOUTH DAILY BEFORE BREAKFAST. 30 tablet 2  . metFORMIN (GLUCOPHAGE) 1000 MG tablet TAKE 1 TABLET BY MOUTH TWICE A DAY WITH A MEAL 180 tablet 1  . ondansetron (ZOFRAN ODT) 8 MG disintegrating tablet Take 1 tablet (8 mg total) by mouth every 8 (eight) hours as needed for nausea. 20 tablet 0  . pantoprazole (PROTONIX) 40 MG tablet TAKE 1 TABLET BY MOUTH EVERY DAY 30 tablet 5  . sertraline (ZOLOFT) 100 MG tablet TAKE 1 TABLET BY MOUTH DAILY 30 tablet 11  . simvastatin (ZOCOR) 20 MG tablet TAKE 1 TABLET BY MOUTH EVERY EVENING 30 tablet 11  . sitaGLIPtin (JANUVIA) 100 MG tablet Take 1 tablet (100 mg total) by mouth daily. 30 tablet 5  . spironolactone (ALDACTONE) 25 MG tablet TAKE 1 TABLET (25 MG TOTAL) BY MOUTH DAILY. 30 tablet 1  . torsemide (DEMADEX) 20 MG tablet Take 20 mg by mouth daily as needed (fluid).     . TRAVATAN Z 0.004 % SOLN ophthalmic solution Place 1 drop into both eyes at bedtime.     Marland Kitchen zolpidem (AMBIEN) 10 MG tablet TAKE 1/2-1 TABLET BY MOUTH AT BEDTIME AS NEEDED (Patient taking differently: TAKE 1/2-1 TABLET BY MOUTH AT BEDTIME) 30 tablet 1   No current facility-administered medications for this visit.    Allergies as of 08/09/2015 - Review Complete 08/09/2015  Allergen Reaction Noted  . Tape Rash 04/06/2012  . Buspirone hcl Nausea Only 09/11/2006  . Glipizide  07/13/2007  . Ivp dye [iodinated diagnostic agents]  01/31/2012  . Oxycodone-acetaminophen Itching     Family History  Problem Relation Age of Onset  . Cancer Father     colon cancer  . Arthritis Father   . Arthritis Mother   . Asthma Mother   . Hypertension Mother     Social History   Social History  . Marital Status: Married    Spouse Name: N/A  . Number of Children: N/A  . Years of  Education: N/A   Occupational History  . Not on file.   Social History Main Topics  . Smoking status: Former Smoker -- 0.20 packs/day for 5 years    Quit date: 03/25/2004  . Smokeless tobacco: Never Used  . Alcohol Use: No  . Drug Use: No  . Sexual Activity: Yes   Other Topics Concern  . Not on file   Social History Narrative     Physical Exam:  Ht 5' 4.75" (1.645 m)  Wt 276 lb (125.193 kg)  BMI 46.26 kg/m2  LMP 06/09/2015 Constitutional: generally well-appearing Psychiatric: alert and oriented x3 Eyes: extraocular movements intact Mouth: oral pharynx moist, no lesions Neck: supple no lymphadenopathy Cardiovascular: heart regular rate and rhythm Lungs: clear to auscultation bilaterally Abdomen: soft, nontender, nondistended, no obvious ascites, no peritoneal signs, normal bowel sounds Extremities: no lower extremity edema bilaterally Skin: no lesions on visible extremities   Assessment and plan: 50 y.o. female with  morbid obesity, elevated baseline anxiety levels, intermittent bloating, family history of colon cancer, likely hemorrhoidal bleeding  First for her family history of colon cancer and her rectal bleeding I recommended repeat colonoscopy. Certainly will remove any polyps screening for colon cancer. I will evaluate her hemorrhoids again and if she is a candidate for our in office banding procedure I will refer her appropriately. I think her intermittent bloating and loose stools are indeed related to her anxiety. Perhaps she is swallowing a lot of air around those times, perhaps this is just simple IBS. She tells me these events only happened during extreme stress. I recommended she try taking 2 Gas-X the next time this happens and consider an Imodium that night or early the next morning. I do not think there is anything serious going on with causing the symptoms except her underlying anxiety. I see no reason for further testing for those symptoms  specifically.   Monica Loffler, MD [facility] 08/09/2015, 9:27 AM  Cc: Tower, Monica Fanny, MD

## 2015-08-12 ENCOUNTER — Other Ambulatory Visit: Payer: Self-pay | Admitting: Internal Medicine

## 2015-08-18 ENCOUNTER — Other Ambulatory Visit (HOSPITAL_COMMUNITY): Payer: Self-pay | Admitting: Internal Medicine

## 2015-08-18 ENCOUNTER — Other Ambulatory Visit: Payer: Self-pay | Admitting: Family Medicine

## 2015-08-18 NOTE — Telephone Encounter (Signed)
Px written for call in   

## 2015-08-18 NOTE — Telephone Encounter (Signed)
ambien last filled on 05/18/15 #30 with 1 additional refill, last CPE was 06/30/15, please advise

## 2015-08-18 NOTE — Telephone Encounter (Signed)
Rx called in as prescribed 

## 2015-08-22 ENCOUNTER — Encounter: Payer: Self-pay | Admitting: Gastroenterology

## 2015-09-01 ENCOUNTER — Encounter: Payer: Self-pay | Admitting: Gastroenterology

## 2015-09-01 ENCOUNTER — Ambulatory Visit: Payer: BLUE CROSS/BLUE SHIELD | Admitting: Gastroenterology

## 2015-09-01 VITALS — BP 126/67 | HR 63 | Temp 95.3°F | Resp 12 | Ht 64.75 in | Wt 276.0 lb

## 2015-09-01 DIAGNOSIS — D123 Benign neoplasm of transverse colon: Secondary | ICD-10-CM

## 2015-09-01 DIAGNOSIS — Z800 Family history of malignant neoplasm of digestive organs: Secondary | ICD-10-CM | POA: Diagnosis not present

## 2015-09-01 DIAGNOSIS — Z86010 Personal history of colonic polyps: Secondary | ICD-10-CM | POA: Diagnosis not present

## 2015-09-01 DIAGNOSIS — K589 Irritable bowel syndrome without diarrhea: Secondary | ICD-10-CM

## 2015-09-01 LAB — GLUCOSE, CAPILLARY
Glucose-Capillary: 113 mg/dL — ABNORMAL HIGH (ref 65–99)
Glucose-Capillary: 117 mg/dL — ABNORMAL HIGH (ref 65–99)

## 2015-09-01 MED ORDER — SODIUM CHLORIDE 0.9 % IV SOLN: 500.0000 mL | INTRAVENOUS | Status: DC

## 2015-09-01 NOTE — Op Note (Signed)
[facility] Patient Name: [name] Procedure Date: 09/01/2015 2:24 PM MRN: [id number] Endoscopist: Milus Banister , MD Age: 50 Referring MD:  Date of Birth: 1965-12-06 Gender: Female Procedure:                Colonoscopy Indications:              High risk colon cancer surveillance: Personal                            history of colonic polyps (2 subCM TAs removed                            2011; also father had colon cancer) Medicines:                Monitored Anesthesia Care Procedure:                Pre-Anesthesia Assessment:                           - Prior to the procedure, a History and Physical                            was performed, and patient medications and                            allergies were reviewed. The patient's tolerance of                            previous anesthesia was also reviewed. The risks                            and benefits of the procedure and the sedation                            options and risks were discussed with the patient.                            All questions were answered, and informed consent                            was obtained. Prior Anticoagulants: The patient has                            taken no previous anticoagulant or antiplatelet                            agents. ASA Grade Assessment: II - A patient with                            mild systemic disease. After reviewing the risks                            and benefits, the patient was deemed in  satisfactory condition to undergo the procedure.                           After obtaining informed consent, the colonoscope                            was passed under direct vision. Throughout the                            procedure, the patient's blood pressure, pulse, and                            oxygen saturations were monitored continuously. The                            Model CF-HQ190L [id number]) scope was introduced                            through the anus and advanced to the the cecum,                            identified by appendiceal orifice and ileocecal                            valve. The colonoscopy was performed without                            difficulty. The patient tolerated the procedure                            well. The quality of the bowel preparation was                            excellent. The ileocecal valve, appendiceal                            orifice, and rectum were photographed. Scope In: 2:35:43 PM Scope Out: 2:45:51 PM Scope Withdrawal Time: 0 hours 7 minutes 58 seconds  Total Procedure Duration: 0 hours 10 minutes 8 seconds  Findings:                 A 4 mm polyp was found in the transverse colon. The                            polyp was sessile. The polyp was removed with a                            cold snare. Resection and retrieval were complete.                           External and internal hemorrhoids were found during                            retroflexion and during perianal exam. The  hemorrhoids were medium-sized.                           The exam was otherwise without abnormality on                            direct and retroflexion views. Complications:            No immediate complications. Estimated blood loss:                            None. Estimated Blood Loss:     Estimated blood loss: none. Impression:               - One 4 mm polyp in the transverse colon, removed                            with a cold snare. Resected and retrieved.                           - External and internal hemorrhoids.                           - The examination was otherwise normal on direct                            and retroflexion views. Recommendation:           - Patient has a contact number available for                            emergencies. The signs and symptoms of potential                            delayed complications  were discussed with the                            patient. Return to normal activities tomorrow.                            Written discharge instructions were provided to the                            patient.                           - Resume previous diet.                           - Continue present medications.                           - My office will arrange referral to Dr. Carlean Purl to                            consider hemorrhoid banding for mixed  internal/external hemorrhoids.                           You will receive a letter within 2-3 weeks with the                            pathology results and my final recommendations.                           If the polyp(s) is proven to be 'pre-cancerous' on                            pathology, you will need repeat colonoscopy in 5                            years. Milus Banister, MD 09/01/2015 2:50:28 PM This report has been signed electronically.

## 2015-09-01 NOTE — Progress Notes (Signed)
Report to PACU, RN, vss, BBS= Clear.  

## 2015-09-01 NOTE — Progress Notes (Signed)
Called to room to assist during endoscopic procedure.  Patient ID and intended procedure confirmed with present staff. Received instructions for my participation in the procedure from the performing physician.  

## 2015-09-01 NOTE — Patient Instructions (Signed)
YOU HAD AN ENDOSCOPIC PROCEDURE TODAY AT [facility]:   Refer to the procedure report that was given to you for any specific questions about what was found during the examination.  If the procedure report does not answer your questions, please call your gastroenterologist to clarify.  If you requested that your care partner not be given the details of your procedure findings, then the procedure report has been included in a sealed envelope for you to review at your convenience later.  YOU SHOULD EXPECT: Some feelings of bloating in the abdomen. Passage of more gas than usual.  Walking can help get rid of the air that was put into your GI tract during the procedure and reduce the bloating. If you had a lower endoscopy (such as a colonoscopy or flexible sigmoidoscopy) you may notice spotting of blood in your stool or on the toilet paper. If you underwent a bowel prep for your procedure, you may not have a normal bowel movement for a few days.  Please Note:  You might notice some irritation and congestion in your nose or some drainage.  This is from the oxygen used during your procedure.  There is no need for concern and it should clear up in a day or so.  SYMPTOMS TO REPORT IMMEDIATELY:   Following lower endoscopy (colonoscopy or flexible sigmoidoscopy):  Excessive amounts of blood in the stool  Significant tenderness or worsening of abdominal pains  Swelling of the abdomen that is new, acute  Fever of 100F or higher    For urgent or emergent issues, a gastroenterologist can be reached at any hour by calling [phone].   DIET: Your first meal following the procedure should be a small meal and then it is ok to progress to your normal diet. Heavy or fried foods are harder to digest and may make you feel nauseous or bloated.  Likewise, meals heavy in dairy and vegetables can increase bloating.  Drink plenty of fluids but you should avoid alcoholic beverages for 24  hours.  ACTIVITY:  You should plan to take it easy for the rest of today and you should NOT DRIVE or use heavy machinery until tomorrow (because of the sedation medicines used during the test).    FOLLOW UP: Our staff will call the number listed on your records the next business day following your procedure to check on you and address any questions or concerns that you may have regarding the information given to you following your procedure. If we do not reach you, we will leave a message.  However, if you are feeling well and you are not experiencing any problems, there is no need to return our call.  We will assume that you have returned to your regular daily activities without incident.  If any biopsies were taken you will be contacted by phone or by letter within the next 1-3 weeks.  Please call us at [phone] if you have not heard about the biopsies in 3 weeks.    SIGNATURES/CONFIDENTIALITY: You and/or your care partner have signed paperwork which will be entered into your electronic medical record.  These signatures attest to the fact that that the information above on your After Visit Summary has been reviewed and is understood.  Full responsibility of the confidentiality of this discharge information lies with you and/or your care-partner.  Will be referred to Dr Carlean Purl for possible hemorrhoidal banding   Information on polyps and hemorrhoids given to you today

## 2015-09-04 ENCOUNTER — Telehealth: Payer: Self-pay | Admitting: *Deleted

## 2015-09-04 NOTE — Telephone Encounter (Signed)
  Follow up Call-  Call back number 09/01/2015  Post procedure Call Back phone  # [phone]  Permission to leave phone message Yes     Patient questions:  Do you have a fever, pain , or abdominal swelling? No. Pain Score  0 *  Have you tolerated food without any problems? Yes.    Have you been able to return to your normal activities? Yes.    Do you have any questions about your discharge instructions: Diet   No. Medications  No. Follow up visit  No.  Do you have questions or concerns about your Care? No.  Actions: * If pain score is 4 or above: No action needed, pain <4.

## 2015-09-12 ENCOUNTER — Encounter: Payer: Self-pay | Admitting: Gastroenterology

## 2015-10-03 ENCOUNTER — Ambulatory Visit: Payer: BLUE CROSS/BLUE SHIELD | Admitting: Internal Medicine

## 2015-10-05 ENCOUNTER — Telehealth: Payer: Self-pay | Admitting: Internal Medicine

## 2015-10-05 ENCOUNTER — Other Ambulatory Visit: Payer: Self-pay

## 2015-10-05 MED ORDER — INSULIN GLARGINE 300 UNIT/ML ~~LOC~~ SOPN: 90.0000 [IU] | PEN_INJECTOR | Freq: Every day | SUBCUTANEOUS | Status: DC

## 2015-10-05 NOTE — Telephone Encounter (Signed)
Pt husband had a liver transplant on Monday and that is why she had to cancel the appt. Pt needs refills on [facility] [address]. Pt will call to schedule another appt when the recovery is over for her husband.

## 2015-10-05 NOTE — Telephone Encounter (Signed)
Patient called and states she had to miss her appointment because her husband has a transplant, she needs refill on insulin and will call and schedule another appointment after his recovery

## 2015-10-20 ENCOUNTER — Encounter: Payer: BLUE CROSS/BLUE SHIELD | Admitting: Internal Medicine

## 2015-11-09 ENCOUNTER — Other Ambulatory Visit (HOSPITAL_COMMUNITY): Payer: Self-pay | Admitting: Internal Medicine

## 2015-11-09 ENCOUNTER — Other Ambulatory Visit: Payer: Self-pay | Admitting: Family Medicine

## 2015-11-09 NOTE — Telephone Encounter (Signed)
Px written for call in   

## 2015-11-09 NOTE — Telephone Encounter (Signed)
Pt had CPE on 06/30/15, last filled on 08/18/15 #30 with 1 additional refill, please advise

## 2015-11-10 NOTE — Telephone Encounter (Signed)
Rx called in as prescribed 

## 2015-12-05 ENCOUNTER — Other Ambulatory Visit: Payer: Self-pay | Admitting: Internal Medicine

## 2015-12-11 ENCOUNTER — Other Ambulatory Visit: Payer: Self-pay | Admitting: Internal Medicine

## 2016-01-15 ENCOUNTER — Other Ambulatory Visit: Payer: Self-pay | Admitting: Internal Medicine

## 2016-01-15 NOTE — Telephone Encounter (Signed)
Patient has not been seen since 4/11 and has cancelled the next 2 appointments with no future appointment scheduled should I refill her medication please advise

## 2016-01-17 NOTE — Telephone Encounter (Signed)
[facility] called to follow up on prescription request.

## 2016-02-12 ENCOUNTER — Other Ambulatory Visit: Payer: Self-pay | Admitting: Internal Medicine

## 2016-02-12 ENCOUNTER — Other Ambulatory Visit (HOSPITAL_COMMUNITY): Payer: Self-pay | Admitting: Internal Medicine

## 2016-02-12 ENCOUNTER — Other Ambulatory Visit: Payer: Self-pay | Admitting: Family Medicine

## 2016-02-13 NOTE — Telephone Encounter (Signed)
Spoke with pharmacy and pt still had 1 refill remaining with them, Rx declined because it's to soon, and pharmacy aware to fill refill on file with them

## 2016-02-20 LAB — HM DIABETES EYE EXAM

## 2016-03-13 ENCOUNTER — Other Ambulatory Visit: Payer: Self-pay | Admitting: Family Medicine

## 2016-04-02 LAB — TSH: TSH: 9.17 u[IU]/mL — AB (ref <=5.90)

## 2016-04-08 ENCOUNTER — Telehealth: Payer: Self-pay

## 2016-04-08 NOTE — Telephone Encounter (Signed)
Called and left message for patient advising we received labs from PCP, no changes in dose right now; but need to make sure she is taking the medication every day in the morning. And advised to call our office to get a lab and office visit to see her to discuss further. Gave office number.

## 2016-04-09 ENCOUNTER — Other Ambulatory Visit (HOSPITAL_COMMUNITY): Payer: Self-pay | Admitting: Internal Medicine

## 2016-04-09 ENCOUNTER — Other Ambulatory Visit: Payer: Self-pay | Admitting: Internal Medicine

## 2016-04-09 NOTE — Telephone Encounter (Signed)
Okay to refill? Last seen 11/03/14. Cancelled last appointment, no further appointments made. Thank you!

## 2016-04-09 NOTE — Telephone Encounter (Signed)
Okay to refill for the next month and a half but needs appointment in this period.

## 2016-05-09 ENCOUNTER — Other Ambulatory Visit: Payer: Self-pay | Admitting: Family Medicine

## 2016-05-10 NOTE — Telephone Encounter (Signed)
No recent/future appts., please advise  

## 2016-05-10 NOTE — Telephone Encounter (Signed)
Please schedule May f/u and refill until then

## 2016-05-10 NOTE — Telephone Encounter (Signed)
appt scheduled and med refilled 

## 2016-05-18 ENCOUNTER — Other Ambulatory Visit: Payer: Self-pay | Admitting: Family Medicine

## 2016-05-20 NOTE — Telephone Encounter (Signed)
Rx called in as prescribed 

## 2016-05-20 NOTE — Telephone Encounter (Signed)
F/u scheduled for 07/12/16, last filled on 11/09/15 #30 tabs with 3 additional refills, please advise

## 2016-05-22 ENCOUNTER — Other Ambulatory Visit: Payer: Self-pay | Admitting: Internal Medicine

## 2016-05-23 NOTE — Telephone Encounter (Signed)
Patient was lost for follow-up. Further refills needed to come from PCP.

## 2016-05-23 NOTE — Telephone Encounter (Signed)
Please advise, advised pharmacy that patient needed an appointment before further refill, no appointment made, last seen 06/2015. Thank you!

## 2016-06-05 ENCOUNTER — Encounter: Payer: Self-pay | Admitting: Family Medicine

## 2016-06-05 ENCOUNTER — Ambulatory Visit (INDEPENDENT_AMBULATORY_CARE_PROVIDER_SITE_OTHER): Payer: BLUE CROSS/BLUE SHIELD | Admitting: Family Medicine

## 2016-06-05 VITALS — BP 122/74 | HR 68 | Temp 98.7°F | Ht 64.75 in | Wt 277.0 lb

## 2016-06-05 DIAGNOSIS — B9789 Other viral agents as the cause of diseases classified elsewhere: Secondary | ICD-10-CM | POA: Diagnosis not present

## 2016-06-05 DIAGNOSIS — J069 Acute upper respiratory infection, unspecified: Secondary | ICD-10-CM | POA: Diagnosis not present

## 2016-06-05 MED ORDER — PREDNISONE 10 MG PO TABS: ORAL_TABLET | ORAL | 0 refills | Status: DC

## 2016-06-05 MED ORDER — FLUTICASONE PROPIONATE 50 MCG/ACT NA SUSP: 2.0000 | Freq: Every day | NASAL | 11 refills | Status: DC | PRN

## 2016-06-05 NOTE — Patient Instructions (Addendum)
Get plain mucinex  If cough begins switch to mucinex DM  Drink lots of fluids Try nasal saline spray or wash to help sinuses Breathe steam  Get back on flonase Continue the zyrtec  Tylenol or ibuprofen for body aches or fever   If facial pain worsens- call and let me know  If you start feeling more tightness in chest and wheezing -fill px for prednisone and take it  Use your albuterol inhaler as needed       Upper Respiratory Infection, Adult Most upper respiratory infections (URIs) are caused by a virus. A URI affects the nose, throat, and upper air passages. The most common type of URI is often called "the common cold." Follow these instructions at home:  Take medicines only as told by your doctor.  Gargle warm saltwater or take cough drops to comfort your throat as told by your doctor.  Use a warm mist humidifier or inhale steam from a shower to increase air moisture. This may make it easier to breathe.  Drink enough fluid to keep your pee (urine) clear or pale yellow.  Eat soups and other clear broths.  Have a healthy diet.  Rest as needed.  Go back to work when your fever is gone or your doctor says it is okay.  You may need to stay home longer to avoid giving your URI to others.  You can also wear a face mask and wash your hands often to prevent spread of the virus.  Use your inhaler more if you have asthma.  Do not use any tobacco products, including cigarettes, chewing tobacco, or electronic cigarettes. If you need help quitting, ask your doctor. Contact a doctor if:  You are getting worse, not better.  Your symptoms are not helped by medicine.  You have chills.  You are getting more short of breath.  You have brown or red mucus.  You have yellow or brown discharge from your nose.  You have pain in your face, especially when you bend forward.  You have a fever.  You have puffy (swollen) neck glands.  You have pain while swallowing.  You have  white areas in the back of your throat. Get help right away if:  You have very bad or constant:  Headache.  Ear pain.  Pain in your forehead, behind your eyes, and over your cheekbones (sinus pain).  Chest pain.  You have long-lasting (chronic) lung disease and any of the following:  Wheezing.  Long-lasting cough.  Coughing up blood.  A change in your usual mucus.  You have a stiff neck.  You have changes in your:  Vision.  Hearing.  Thinking.  Mood. This information is not intended to replace advice given to you by your health care provider. Make sure you discuss any questions you have with your health care provider. Document Released: 08/28/2007 Document Revised: 11/12/2015 Document Reviewed: 06/16/2013 Elsevier Interactive Patient Education  2017 [employer].

## 2016-06-05 NOTE — Progress Notes (Signed)
Pre visit review using our clinic review tool, if applicable. No additional management support is needed unless otherwise documented below in the visit note. 

## 2016-06-05 NOTE — Progress Notes (Signed)
Subjective:    Patient ID: [name], female    DOB: 02/26/1966, 51 y.o.   MRN: [id number]  HPI Here with uri symptoms   ST on Monday pnd and ears hurt  Hurts to swallow and now nasal congestion  R facial pain  Chest is tight  No productive cough yet -not wheezing yet  Clearing throat   No fever  Taking zyrtec for allergies  Not on flonase but needs refill   Patient Active Problem List   Diagnosis Date Noted  . Viral URI with cough 06/05/2016  . Acute sinusitis 07/05/2014  . Strep pharyngitis 03/01/2014  . Routine general medical examination at a health care facility 12/19/2013  . Palpitations 02/03/2013  . Nonspecific abnormal toxicology 10/29/2012  . 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 Papillary thyroid carcinoma ([place]) 04/27/2012  . Chronic diastolic heart failure ([place]) 02/10/2012  . Diastolic CHF ([place]) 11/94/1740  . Acute diastolic heart failure ([place]) 01/17/2012  . OSA (obstructive sleep apnea) just got cpap at home this past week 01/17/12 01/17/2012  . CHF (congestive heart failure) ([place]) 01/16/2012  . Anxiety 01/16/2012  . Chronic allergic rhinitis 10/26/2010  . Insomnia 10/26/2010  . OBESITY, MORBID 04/27/2010  . HEMORRHOIDS, INTERNAL, WITH BLEEDING 08/17/2009  . GERD 04/27/2008  . Essential hypertension 11/03/2007  . HYPERLIPIDEMIA 05/14/2007  . OTHER ORGANIC SLEEP DISORDERS 03/09/2007  . Diabetes type 2, uncontrolled ([place]) 09/11/2006  . POLYCYSTIC OVARIES 09/11/2006  . ANEMIA-IRON DEFICIENCY 09/11/2006  . Depression 09/11/2006  . EXTERNAL HEMORRHOIDS 09/11/2006  . Asthma 09/11/2006   Past Medical History:  Diagnosis Date  . Allergy    SEASONAL  . Anemia    iron deficiency  . Anxiety   . Arthritis   . Asthma    a. 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  . Cellulitis and abscess of leg 02/2014  . CHF (congestive heart failure) ([place])   . Chronic kidney disease    /13  . Complication of anesthesia     DIFFICULT WAKING- 2011  . Depression    a. Stress reaction 08/2011 in multiple social stressors.  . Diabetes mellitus   . Dysrhythmia   . Gastritis   . GERD (gastroesophageal reflux disease)   . Heart murmur   . Hemorrhoid   . Hyperlipidemia   . Hypertension   . Hyperthyroidism   . Insomnia   . Morbid obesity ([place])   . OSA (obstructive sleep apnea) 01/17/2012  . Papillary thyroid carcinoma ([place])   . PCOS (polycystic ovarian syndrome)   . Sleep apnea    C PAP  . Vertigo    Past Surgical History:  Procedure Laterality Date  . CHOLECYSTECTOMY    . HEMORRHOID SURGERY    . INTRAUTERINE DEVICE INSERTION  2011  . SHOULDER SURGERY  06/2009  . THYROIDECTOMY  04/10/2012   Procedure: THYROIDECTOMY;  Surgeon: Jerrell Belfast, MD;  Location: [facility];  Service: ENT;  Laterality: N/A;  Total Thyroidectomy  . TONSILLECTOMY AND ADENOIDECTOMY  CHILD  . TUBAL LIGATION     Social History  Substance Use Topics  . Smoking status: Former Smoker    Packs/day: 0.20    Years: 5.00    Quit date: 03/25/2004  . Smokeless tobacco: Never Used  . Alcohol use No   Family History  Problem Relation Age of Onset  . Cancer Father     colon cancer  . Arthritis Father   . Colon cancer Father   . Colon  polyps Father   . Arthritis Mother   . Asthma Mother   . Hypertension Mother   . Ovarian cancer Maternal Aunt   . Diabetes Paternal Aunt   . Diabetes Paternal Uncle   . Heart disease Maternal Grandfather    Allergies  Allergen Reactions  . Tape Rash    Plastic tape  . Buspirone Hcl Nausea Only  . Glipizide     REACTION: sleepy, low sugar  . Ivp Dye [Iodinated Diagnostic Agents]     Shut kidneys down  . Oxycodone-Acetaminophen Itching   Current Outpatient Prescriptions on File Prior to Visit  Medication Sig Dispense Refill  . acetaminophen (TYLENOL) 325 MG tablet Take 1 tablet (325 mg total) by mouth every 4 (four) hours as needed (pain/fever).    Marland Kitchen albuterol (PROAIR HFA) 108 (90 BASE)  MCG/ACT inhaler Inhale 2 puffs into the lungs every 4 (four) hours as needed. For wheezing 1 Inhaler 4  . B-D ULTRAFINE III SHORT PEN 31G X 8 MM MISC USE ONCE A DAY 100 each 1  . buPROPion (WELLBUTRIN XL) 150 MG 24 hr tablet TAKE 1 TABLET BY MOUTH DAILY 30 tablet 2  . carvedilol (COREG) 3.125 MG tablet TAKE 1 TABLET BY MOUTH TWICE A DAY WITH A MEAL 60 tablet 11  . cetirizine (ZYRTEC) 10 MG tablet TAKE 1 TABLET (10 MG TOTAL) BY MOUTH DAILY. 30 tablet 11  . ferrous sulfate 325 (65 FE) MG tablet Take 325 mg by mouth 2 (two) times daily.      Marland Kitchen glucose blood (ONE TOUCH ULTRA TEST) test strip Use 2x a day 100 each 2  . INVOKANA 300 MG TABS tablet TAKE 1 TABLET BY MOUTH EVERY DAY BEFORE BREAKFAST 30 tablet 5  . JANUVIA 100 MG tablet TAKE 1 TABLET BY MOUTH EVERY DAY 30 tablet 5  . KLOR-CON M20 20 MEQ tablet TAKE 1 TABLET BY MOUTH 2 TIMES A WEEK (TAKE WITH TORSEMIDE) (Patient taking differently: TAKE 1 TABLET BY MOUTH DAILY AS NEEDED WHEN TAKING TORSEMIDE) 30 tablet 5  . levothyroxine (SYNTHROID, LEVOTHROID) 175 MCG tablet TAKE 1 TABLET BY MOUTH DAILY BEFORE BREAKFAST. 30 tablet 2  . metFORMIN (GLUCOPHAGE) 1000 MG tablet TAKE 1 TABLET BY MOUTH TWICE A DAY WITH A MEAL 180 tablet 1  . ondansetron (ZOFRAN ODT) 8 MG disintegrating tablet Take 1 tablet (8 mg total) by mouth every 8 (eight) hours as needed for nausea. 20 tablet 0  . pantoprazole (PROTONIX) 40 MG tablet TAKE 1 TABLET BY MOUTH EVERY DAY 30 tablet 2  . sertraline (ZOLOFT) 100 MG tablet TAKE 1 TABLET BY MOUTH DAILY 30 tablet 11  . simvastatin (ZOCOR) 20 MG tablet TAKE 1 TABLET BY MOUTH EVERY EVENING 30 tablet 11  . spironolactone (ALDACTONE) 25 MG tablet TAKE 1 TABLET (25 MG TOTAL) BY MOUTH DAILY. 30 tablet 1  . torsemide (DEMADEX) 20 MG tablet Take 20 mg by mouth daily as needed (fluid).     . TOUJEO SOLOSTAR 300 UNIT/ML SOPN INJECT 90 UNITS INTO THE SKIN AT BEDTIME. 18 pen 1  . TRAVATAN Z 0.004 % SOLN ophthalmic solution Place 1 drop into both  eyes at bedtime.     Marland Kitchen zolpidem (AMBIEN) 10 MG tablet TAKE 1/2 TO 1 TABLET AT BEDTIME AS NEEDED 30 tablet 3   No current facility-administered medications on file prior to visit.      Review of Systems  Constitutional: Positive for appetite change and fatigue. Negative for fever.  HENT: Positive for congestion, postnasal drip,  rhinorrhea, sinus pressure, sneezing and sore throat. Negative for ear pain.   Eyes: Negative for pain and discharge.  Respiratory: Positive for cough. Negative for shortness of breath, wheezing and stridor.   Cardiovascular: Negative for chest pain.  Gastrointestinal: Negative for diarrhea, nausea and vomiting.  Genitourinary: Negative for frequency, hematuria and urgency.  Musculoskeletal: Negative for arthralgias and myalgias.  Skin: Negative for rash.  Neurological: Positive for headaches. Negative for dizziness, weakness and light-headedness.  Psychiatric/Behavioral: Negative for confusion and dysphoric mood.          Objective:   Physical Exam  Constitutional: She appears well-developed and well-nourished. No distress.  HENT:  Head: Normocephalic and atraumatic.  Right Ear: External ear normal.  Left Ear: External ear normal.  Mouth/Throat: Oropharynx is clear and moist.  Nares are injected and congested  No sinus tenderness Clear rhinorrhea and post nasal drip   Eyes: Conjunctivae and EOM are normal. Pupils are equal, round, and reactive to light. Right eye exhibits no discharge. Left eye exhibits no discharge.  Neck: Normal range of motion. Neck supple.  Cardiovascular: Normal rate and normal heart sounds.   Pulmonary/Chest: Effort normal and breath sounds normal. No respiratory distress. She has no wheezes. She has no rales. She exhibits no tenderness.  Good air exch  Scant wheeze on forced expiration  No rales or rhonchi   Lymphadenopathy:    She has no cervical adenopathy.  Neurological: She is alert.  Skin: Skin is warm and dry. No rash  noted. No pallor.  Psychiatric: She has a normal mood and affect.          Assessment & Plan:   Problem List Items Addressed This Visit      Respiratory   Viral URI with cough    Reassuring exam in pt with paset hx of reactive airways  Disc symptomatic care - see instructions on AVS  Given px for prednisone to fill if her chest becomes more tight or wheezing increases (and update) Update if not starting to improve in a week or if worsening  (incl sinus pain)

## 2016-06-06 ENCOUNTER — Encounter: Payer: Self-pay | Admitting: Family Medicine

## 2016-06-06 MED ORDER — AZITHROMYCIN 250 MG PO TABS: ORAL_TABLET | ORAL | 0 refills | Status: DC

## 2016-06-06 NOTE — Assessment & Plan Note (Signed)
Reassuring exam in pt with paset hx of reactive airways  Disc symptomatic care - see instructions on AVS  Given px for prednisone to fill if her chest becomes more tight or wheezing increases (and update) Update if not starting to improve in a week or if worsening  (incl sinus pain)

## 2016-06-09 ENCOUNTER — Other Ambulatory Visit: Payer: Self-pay | Admitting: Internal Medicine

## 2016-06-12 ENCOUNTER — Ambulatory Visit (INDEPENDENT_AMBULATORY_CARE_PROVIDER_SITE_OTHER)
Admission: RE | Admit: 2016-06-12 | Discharge: 2016-06-12 | Disposition: A | Discharge status: Home / Self Care | Payer: BLUE CROSS/BLUE SHIELD | Source: Ambulatory Visit | Attending: Family Medicine | Admitting: Family Medicine

## 2016-06-12 ENCOUNTER — Telehealth: Payer: Self-pay

## 2016-06-12 ENCOUNTER — Encounter: Payer: Self-pay | Admitting: Family Medicine

## 2016-06-12 DIAGNOSIS — J069 Acute upper respiratory infection, unspecified: Secondary | ICD-10-CM

## 2016-06-12 DIAGNOSIS — B9789 Other viral agents as the cause of diseases classified elsewhere: Principal | ICD-10-CM

## 2016-06-12 NOTE — Telephone Encounter (Signed)
Order done-thanks  I will hopefully have a result before the end of the day

## 2016-06-12 NOTE — Telephone Encounter (Signed)
Please have her come in for a cxr  Let me know what day and I will order it   ? Fever ? Prod cough- what color sputum?  ? Other symptoms   thanks

## 2016-06-12 NOTE — Telephone Encounter (Signed)
Spoke to pt who denies fever, but states she has a productive cough with yellow/green sputum. States that she will come to office today after 2pm to complete CXR

## 2016-06-12 NOTE — Telephone Encounter (Signed)
Pt left v/m; pt seen 06/05/16; pt has finished abx and has few more days of prednisone;pt has taken mucinex pack; pt is feeling worse; pt developed cough that is worse at night. Is there another abx could try or what to do. [facility].

## 2016-06-14 ENCOUNTER — Encounter: Payer: Self-pay | Admitting: Family Medicine

## 2016-06-19 ENCOUNTER — Telehealth: Payer: Self-pay | Admitting: Internal Medicine

## 2016-06-26 ENCOUNTER — Telehealth: Payer: Self-pay | Admitting: Family Medicine

## 2016-06-26 DIAGNOSIS — I10 Essential (primary) hypertension: Secondary | ICD-10-CM

## 2016-06-26 DIAGNOSIS — D509 Iron deficiency anemia, unspecified: Secondary | ICD-10-CM

## 2016-06-26 DIAGNOSIS — E782 Mixed hyperlipidemia: Secondary | ICD-10-CM

## 2016-06-26 NOTE — Telephone Encounter (Signed)
-----   Message from Ellamae Sia sent at 06/25/2016 11:35 AM EDT ----- Regarding: lab orders for Monday 4.16.18 Lab orders for a f/u appt

## 2016-07-01 ENCOUNTER — Ambulatory Visit (INDEPENDENT_AMBULATORY_CARE_PROVIDER_SITE_OTHER): Payer: BLUE CROSS/BLUE SHIELD | Admitting: Internal Medicine

## 2016-07-01 ENCOUNTER — Encounter: Payer: Self-pay | Admitting: Internal Medicine

## 2016-07-01 VITALS — BP 142/92 | HR 97 | Ht 65.0 in | Wt 279.0 lb

## 2016-07-01 DIAGNOSIS — E890 Postprocedural hypothyroidism: Secondary | ICD-10-CM | POA: Diagnosis not present

## 2016-07-01 DIAGNOSIS — E1142 Type 2 diabetes mellitus with diabetic polyneuropathy: Secondary | ICD-10-CM

## 2016-07-01 DIAGNOSIS — C73 Malignant neoplasm of thyroid gland: Secondary | ICD-10-CM | POA: Diagnosis not present

## 2016-07-01 DIAGNOSIS — E1165 Type 2 diabetes mellitus with hyperglycemia: Secondary | ICD-10-CM | POA: Diagnosis not present

## 2016-07-01 DIAGNOSIS — IMO0002 Reserved for concepts with insufficient information to code with codable children: Secondary | ICD-10-CM

## 2016-07-01 LAB — POCT GLYCOSYLATED HEMOGLOBIN (HGB A1C): Hemoglobin A1C: 7.4

## 2016-07-01 MED ORDER — INSULIN GLARGINE 300 UNIT/ML ~~LOC~~ SOPN: 90.0000 [IU] | PEN_INJECTOR | Freq: Every day | SUBCUTANEOUS | 1 refills | Status: DC

## 2016-07-01 MED ORDER — LEVOTHYROXINE SODIUM 175 MCG PO TABS: ORAL_TABLET | ORAL | 1 refills | Status: DC

## 2016-07-01 NOTE — Patient Instructions (Signed)
Please continue:  Toujeo 45 units x2 at bedtime. Metformin 1000 mg 2x a day. Invokana 300 mg in am. Januvia 100 mg in am.  Take the thyroid hormone (175 mcg) every day, with water, at least 30 minutes before breakfast, separated by at least 4 hours from: - acid reflux medications - calcium - iron - multivitamins  Please return in 3 months with your sugar log.

## 2016-07-01 NOTE — Addendum Note (Signed)
Addended by: Caprice Beaver T on: 07/01/2016 04:27 PM   Modules accepted: Orders

## 2016-07-01 NOTE — Progress Notes (Signed)
Patient ID: [name], female   DOB: 1966-01-03, 51 y.o.   MRN: [id number]  HPI: [name] is a 51 y.o.-year-old female, returning for f/u for DM2, dx 2008, insulin-dependent since ~2011, uncontrolled, with complications (peripheral neuropathy, dCHF). I am also following the pt for h/o papillary thyroid cancer and iatrogenic hypothyroidism. Last visit 1 year ago.  DM2: Last hemoglobin A1c was: Lab Results  Component Value Date   HGBA1C 7.2 (H) 06/16/2015   HGBA1C 8.2 (H) 11/03/2014   HGBA1C 9.7 (H) 12/20/2013    Pt is on a regimen of: - Lantus solostar >> Toujeo 80 >> 90 units qhs (45 units x 2) - Metformin 1000 mg bid  - started 11/2013 >> tolerates it well - Invokana 300 mg in am - started 12/2013 - Januvia 100 mg in am She was also on Glipizide but developed hypoglycemia >> loss of consciousness. She was also on Amaryl >> taken off.   Pt is not checking sugars at all. Reviewed sugars from last time: - am: 210-300 >> n/c >> 123-160 >> 170-220 >> 140-160 - before lunch: 116-163 >> n/c - before dinner: 113 >> n/c - bedtime: 210-300 >> 178, 225-240 >> 155-178, 211 >> n/c >> 160s No lows. Lowest sugar was 170 >> 120s; Highest sugar was 275 >> 190.  - no CKD, last BUN/creatinine:  Lab Results  Component Value Date   BUN 18 07/22/2015   CREATININE 1.13 (H) 07/22/2015  Last ACR was 3.4 on 10/20/2012. Not on ACEI/ARB.   - last set of lipids: Lab Results  Component Value Date   CHOL 147 06/16/2015   HDL 41.50 06/16/2015   LDLCALC 71 06/16/2015   LDLDIRECT 100.0 02/04/2011   TRIG 177.0 (H) 06/16/2015   CHOLHDL 4 06/16/2015  She is on Simvastatin.  - last eye exam was in 02/20/2016. No DR. Has glaucoma - on drops.  - + numbness and tingling in her feet.  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 It was incidentally found that she had a subcentimeter focus of papillary thyroid  cancer, follicular variant. No RAITx.  Most recent neck U/S (11/24/2014): Post thyroidectomy. No suspicious lymphadenopathy or soft tissue.  She was taking the Synthroid 175 mcg - noncompliant: - missing it 2x a week - weekend - fasting - with water  - iron with lunch and bedtime - Protonix at bedtime - no MVI - no Calcium  Reviewed TSH levels: Lab Results  Component Value Date   TSH 9.17 (A) 04/02/2016   TSH 11.83 (H) 06/16/2015   TSH 1.82 11/03/2014   TSH 8.756 (H) 03/24/2014   TSH 2.96 02/14/2014   TSH 29.85 (H) 12/20/2013   TSH 1.86 09/04/2012   TSH 9.63 (H) 07/27/2012   TSH 9.74 Repeated and verified X2. (H) 06/26/2012   TSH 24.58 (H) 05/22/2012   ROS: Constitutional: + weight gain, + fatigue, + subjective hyperthermia (hot flushes) Eyes: no blurry vision, no xerophthalmia ENT: no sore throat, no nodules palpated in throat, + dysphagia/no odynophagia, no hoarseness Cardiovascular: no CP/no SOB/+ palpitations/no leg swelling Respiratory: no cough/no SOB/no wheezing Gastrointestinal: no N/V/D/C Musculoskeletal: + muscle/+ joint aches Skin: no rashes Neurological: no tremors/numbness/tingling/dizziness  I reviewed pt's medications, allergies, PMH, social hx, family hx, and changes were documented in the history of present illness. Otherwise, unchanged from my initial visit note.  PE: BP (!) 142/92 (BP Location: Left Arm, Patient Position: Sitting)   Pulse 97  Ht _0  (1.651 m)   Wt 279 lb (126.6 kg)   SpO2 97%   BMI 46.43 kg/m  Wt Readings from Last 3 Encounters:  07/01/16 279 lb (126.6 kg)  06/05/16 277 lb (125.6 kg)  09/01/15 276 lb (125.2 kg)   Constitutional: obese, central obesity, in NAD Eyes: PERRLA, EOMI, no exophthalmos ENT: moist mucous membranes, no thyromegaly, no cervical lymphadenopathy Cardiovascular: RRR, + 2/6 SEM no RG Respiratory: CTA B Gastrointestinal: abdomen soft, NT, ND, BS+ Musculoskeletal: no deformities, strength intact in all  4 Skin: moist, warm, no rashes  ASSESSMENT: 1. DM2, insulin-dependent, uncontrolled, without complications  2. Iatrogenic Hypothyroidism - h/o med noncompliance  3. 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 Patient with a h/o uncontrolled diabetes, on basal insulin regimen + metformin, returning after a long absence. She had a great improvement after adding Invokana and Januvia >> latest HbA1c recently 7.2%! She is not checkign sugars now >> advised to restart! I cannot change the regimen now, w/o more data, but may need adjustment at next visit. - hBa1C IS SLIGHTLY HIGHER, 7.4% - I suggested to:  Patient Instructions  Please continue:  Toujeo 45 units x2 at bedtime. Metformin 1000 mg 2x a day. Invokana 300 mg in am. Januvia 100 mg in am.  Take the thyroid hormone (175 mcg) every day, with water, at least 30 minutes before breakfast, separated by at least 4 hours from: - acid reflux medications - calcium - iron - multivitamins  Please return in 3 months with your sugar log.   - continue checking her sugars at different times of the day - check 2 times a day, rotating checks - advised for yearly eye exams >> she is up to date - Return to clinic in 3 month with sugar log  2. Iatrogenic Hypothyroidism - recent TFTs with abnormal TSH: 9 in 03/2015. - reassessed how she is taking the LT4 >> she states she is still skipping weekend doses >> advised to take it EVERY DAY (she is now taking it while at work >> advised to move this to nightstand) - continue LT4 175 mcg daily >> will recheck at next blood draw  - in few days  3. Follicular PTC - neck U/S normal in 11/2014 - no neck compression sxs or masses palpated  Philemon Kingdom, MD PhD [facility] Endocrinology

## 2016-07-08 ENCOUNTER — Other Ambulatory Visit: Payer: BLUE CROSS/BLUE SHIELD

## 2016-07-12 ENCOUNTER — Ambulatory Visit: Payer: BLUE CROSS/BLUE SHIELD | Admitting: Family Medicine

## 2016-07-13 ENCOUNTER — Other Ambulatory Visit (HOSPITAL_COMMUNITY): Payer: Self-pay | Admitting: Internal Medicine

## 2016-07-13 ENCOUNTER — Other Ambulatory Visit: Payer: Self-pay | Admitting: Family Medicine

## 2016-07-15 ENCOUNTER — Other Ambulatory Visit: Payer: BLUE CROSS/BLUE SHIELD

## 2016-07-17 ENCOUNTER — Ambulatory Visit: Payer: BLUE CROSS/BLUE SHIELD | Admitting: Family Medicine

## 2016-07-18 ENCOUNTER — Telehealth (HOSPITAL_COMMUNITY): Payer: Self-pay | Admitting: Vascular Surgery

## 2016-07-18 ENCOUNTER — Other Ambulatory Visit (HOSPITAL_COMMUNITY): Payer: Self-pay | Admitting: Internal Medicine

## 2016-07-18 NOTE — Telephone Encounter (Signed)
PT called to make appt, she is out of medication appt has been made for 08/14/16 @ 10:20 Please advise

## 2016-07-26 ENCOUNTER — Other Ambulatory Visit (INDEPENDENT_AMBULATORY_CARE_PROVIDER_SITE_OTHER): Payer: BLUE CROSS/BLUE SHIELD

## 2016-07-26 DIAGNOSIS — D509 Iron deficiency anemia, unspecified: Secondary | ICD-10-CM

## 2016-07-26 DIAGNOSIS — E782 Mixed hyperlipidemia: Secondary | ICD-10-CM

## 2016-07-26 DIAGNOSIS — R7989 Other specified abnormal findings of blood chemistry: Secondary | ICD-10-CM

## 2016-07-26 DIAGNOSIS — I10 Essential (primary) hypertension: Secondary | ICD-10-CM

## 2016-07-26 LAB — CBC WITH DIFFERENTIAL/PLATELET
BASOS ABS: 0.1 10*3/uL (ref 0.0–0.1)
BASOS PCT: 1.1 % (ref 0.0–3.0)
EOS ABS: 0.4 10*3/uL (ref 0.0–0.7)
Eosinophils Relative: 4.5 % (ref 0.0–5.0)
HCT: 40.2 % (ref 36.0–46.0)
HEMOGLOBIN: 13.1 g/dL (ref 12.0–15.0)
LYMPHS PCT: 17.4 % (ref 12.0–46.0)
Lymphs Abs: 1.5 10*3/uL (ref 0.7–4.0)
MCHC: 32.5 g/dL (ref 30.0–36.0)
MCV: 86.5 fl (ref 78.0–100.0)
Monocytes Absolute: 0.4 10*3/uL (ref 0.1–1.0)
Monocytes Relative: 4.7 % (ref 3.0–12.0)
Neutro Abs: 6.2 10*3/uL (ref 1.4–7.7)
Neutrophils Relative %: 72.3 % (ref 43.0–77.0)
Platelets: 277 10*3/uL (ref 150.0–400.0)
RBC: 4.65 Mil/uL (ref 3.87–5.11)
RDW: 16.3 % — AB (ref 11.5–15.5)
WBC: 8.6 10*3/uL (ref 4.0–10.5)

## 2016-07-26 LAB — COMPREHENSIVE METABOLIC PANEL
ALBUMIN: 4.5 g/dL (ref 3.5–5.2)
ALT: 19 U/L (ref 0–35)
AST: 18 U/L (ref 0–37)
Alkaline Phosphatase: 84 U/L (ref 39–117)
BILIRUBIN TOTAL: 0.2 mg/dL (ref 0.2–1.2)
BUN: 11 mg/dL (ref 6–23)
CALCIUM: 9.5 mg/dL (ref 8.4–10.5)
CHLORIDE: 103 meq/L (ref 96–112)
CO2: 28 mEq/L (ref 19–32)
CREATININE: 0.89 mg/dL (ref 0.40–1.20)
GFR: 71.14 mL/min (ref >60.00)
Glucose, Bld: 176 mg/dL — ABNORMAL HIGH (ref 70–99)
Potassium: 3.9 mEq/L (ref 3.5–5.1)
SODIUM: 138 meq/L (ref 135–145)
TOTAL PROTEIN: 7.2 g/dL (ref 6.0–8.3)

## 2016-07-26 LAB — LDL CHOLESTEROL, DIRECT: LDL DIRECT: 81 mg/dL

## 2016-07-26 LAB — LIPID PANEL
CHOLESTEROL: 135 mg/dL (ref 0–200)
HDL: 33.4 mg/dL — ABNORMAL LOW (ref >39.00)
NonHDL: 101.11
Total CHOL/HDL Ratio: 4
Triglycerides: 265 mg/dL — ABNORMAL HIGH (ref 0.0–149.0)
VLDL: 53 mg/dL — AB (ref 0.0–40.0)

## 2016-07-26 LAB — FERRITIN: FERRITIN: 16.2 ng/mL (ref 10.0–291.0)

## 2016-07-26 LAB — TSH: TSH: 14.73 u[IU]/mL — AB (ref 0.35–4.50)

## 2016-07-27 ENCOUNTER — Other Ambulatory Visit: Payer: Self-pay | Admitting: Family Medicine

## 2016-07-30 ENCOUNTER — Ambulatory Visit (INDEPENDENT_AMBULATORY_CARE_PROVIDER_SITE_OTHER): Payer: BLUE CROSS/BLUE SHIELD | Admitting: Family Medicine

## 2016-07-30 ENCOUNTER — Encounter: Payer: Self-pay | Admitting: Family Medicine

## 2016-07-30 VITALS — BP 130/80 | HR 61 | Temp 97.8°F | Ht 64.75 in | Wt 277.0 lb

## 2016-07-30 DIAGNOSIS — E782 Mixed hyperlipidemia: Secondary | ICD-10-CM

## 2016-07-30 DIAGNOSIS — E1142 Type 2 diabetes mellitus with diabetic polyneuropathy: Secondary | ICD-10-CM | POA: Diagnosis not present

## 2016-07-30 DIAGNOSIS — I10 Essential (primary) hypertension: Secondary | ICD-10-CM

## 2016-07-30 DIAGNOSIS — I5032 Chronic diastolic (congestive) heart failure: Secondary | ICD-10-CM | POA: Diagnosis not present

## 2016-07-30 DIAGNOSIS — F418 Other specified anxiety disorders: Secondary | ICD-10-CM

## 2016-07-30 DIAGNOSIS — IMO0002 Reserved for concepts with insufficient information to code with codable children: Secondary | ICD-10-CM

## 2016-07-30 DIAGNOSIS — E1165 Type 2 diabetes mellitus with hyperglycemia: Secondary | ICD-10-CM

## 2016-07-30 DIAGNOSIS — C73 Malignant neoplasm of thyroid gland: Secondary | ICD-10-CM

## 2016-07-30 MED ORDER — BUPROPION HCL ER (XL) 150 MG PO TB24: 150.0000 mg | ORAL_TABLET | Freq: Every day | ORAL | 11 refills | Status: DC

## 2016-07-30 MED ORDER — SERTRALINE HCL 100 MG PO TABS
100.0000 mg | ORAL_TABLET | Freq: Every day | ORAL | 11 refills | Status: DC
Start: 2016-07-30 — End: 2017-08-08

## 2016-07-30 MED ORDER — SIMVASTATIN 20 MG PO TABS: 20.0000 mg | ORAL_TABLET | Freq: Every evening | ORAL | 11 refills | Status: DC

## 2016-07-30 MED ORDER — CARVEDILOL 3.125 MG PO TABS: ORAL_TABLET | ORAL | 5 refills | Status: DC

## 2016-07-30 NOTE — Assessment & Plan Note (Signed)
Discussed how this problem influences overall health and the risks it imposes  Reviewed plan for weight loss with lower calorie diet (via better food choices and also portion control or program like weight watchers) and exercise building up to or more than 30 minutes 5 days per week including some aerobic activity    

## 2016-07-30 NOTE — Assessment & Plan Note (Signed)
Pt says she has had more ankle edema lately along with occ palpitations Will f/u with cardiology as planned No edema today

## 2016-07-30 NOTE — Assessment & Plan Note (Signed)
For cardiology f/u soon

## 2016-07-30 NOTE — Assessment & Plan Note (Signed)
Seeing Dr Cruzita Lederer  Working on lifestyle habits Lab Results  Component Value Date   HGBA1C 7.4 07/01/2016

## 2016-07-30 NOTE — Assessment & Plan Note (Signed)
Stable despite stressors with wellbutrin and zoloft No changes made Reviewed stressors/ coping techniques/symptoms/ support sources/ tx options and side effects in detail today Enc outdoor time and exercise as tolerated

## 2016-07-30 NOTE — Assessment & Plan Note (Signed)
Disc goals for lipids and reasons to control them Rev labs with pt Rev low sat fat diet in detail Very close to goal with simvastatin and diet (LDL 71)

## 2016-07-30 NOTE — Assessment & Plan Note (Signed)
Doing well  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

## 2016-07-30 NOTE — Assessment & Plan Note (Signed)
bp in fair control at this time  BP Readings from Last 1 Encounters:  07/30/16 130/80   No changes needed Disc lifstyle change with low sodium diet and exercise  Better on 2nd check today-states feeling very stressed

## 2016-07-30 NOTE — Progress Notes (Signed)
Subjective:    Patient ID: [name], female    DOB: [birth year], 51 y.o.   MRN: [id number]  HPI Here for f/u of chronic medical problems   Wt Readings from Last 3 Encounters:  07/30/16 277 lb (125.6 kg)  07/01/16 279 lb (126.6 kg)  06/05/16 277 lb (125.6 kg)  wt is down 2 lb  Trying to eat better - for diabetes  Walks for exercise at the park at least 2 nights per week (30 minutes)-- building endurance (husband has kidney issues)  bmi 46.4  Lots of stress/ a lot going on   Husband had a liver transplant in July , having kidney problems and edema   bp is stable today  No cp or palpitations or headaches or edema  No side effects to medicines  BP Readings from Last 3 Encounters:  07/30/16 (!) 142/84  07/01/16 (!) 142/92  06/05/16 122/74    Has not checked at home  Better on 2nd check today BP: 130/80        Chemistry      Component Value Date/Time   NA 138 07/26/2016 0838   K 3.9 07/26/2016 0838   CL 103 07/26/2016 0838   CO2 28 07/26/2016 0838   BUN 11 07/26/2016 0838   CREATININE 0.89 07/26/2016 0838      Component Value Date/Time   CALCIUM 9.5 07/26/2016 0838   ALKPHOS 84 07/26/2016 0838   AST 18 07/26/2016 0838   ALT 19 07/26/2016 0838   BILITOT 0.2 07/26/2016 0838        Hx of chronic diastolic HF Has used demadex for edema prn (legs swell at night) -not severe , but is a little more short of breath  Improved with cpap  Sees cardiology this month- having episodes of palpitations   (possibly due to stress)    Sees Dr Cruzita Lederer for DM2 Lab Results  Component Value Date   HGBA1C 7.4 07/01/2016    Also for hypothyroidism Lab Results  Component Value Date   TSH 14.73 (H) 07/26/2016   Has been working with dose adj  Hx of surgery for papillary thyroid carcinoma  Tired and weak feeling/ occ irritable or anxious -just changed dose of levothy   Hx of hyperlipidemia  Lab Results  Component Value Date   CHOL 135 07/26/2016   CHOL 147  06/16/2015   CHOL 155 12/20/2013   Lab Results  Component Value Date   HDL 33.40 (L) 07/26/2016   HDL 41.50 06/16/2015   HDL 36.00 (L) 12/20/2013   Lab Results  Component Value Date   LDLCALC 71 06/16/2015   LDLCALC 91 12/20/2013   LDLCALC 78 01/17/2012   Lab Results  Component Value Date   TRIG 265.0 (H) 07/26/2016   TRIG 177.0 (H) 06/16/2015   TRIG 141.0 12/20/2013   Lab Results  Component Value Date   CHOLHDL 4 07/26/2016   CHOLHDL 4 06/16/2015   CHOLHDL 4 12/20/2013   Lab Results  Component Value Date   LDLDIRECT 81.0 07/26/2016   LDLDIRECT 100.0 02/04/2011   LDLDIRECT 98.9 10/13/2007     Ratio is unchanged Still taking simvastatin  LDL is at 71    Hx of iron def Ferritin is 16.2 Lab Results  Component Value Date   WBC 8.6 07/26/2016   HGB 13.1 07/26/2016   HCT 40.2 07/26/2016   MCV 86.5 07/26/2016   PLT 277.0 07/26/2016      Review of Systems Review of Systems  Constitutional: Negative for  fever, appetite change,  and unexpected weight change. Tired in general  ENT-ear feels funny at times  Eyes: Negative for pain and visual disturbance.  Respiratory: Negative for cough and shortness of breath.   Cardiovascular: Negative for cp or palpitations    Gastrointestinal: Negative for nausea, diarrhea and constipation.  Genitourinary: Negative for urgency and frequency.  Skin: Negative for pallor or rash   MSK - legs feel weak  Neurological: Negative for weakness, light-headedness, numbness and headaches.  Hematological: Negative for adenopathy. Does not bruise/bleed easily.  Psychiatric/Behavioral: Negative for dysphoric mood. The patient is sometimes anxious - medication helps        Objective:   Physical Exam  Constitutional: She appears well-developed and well-nourished. No distress.  obese and well appearing   HENT:  Head: Normocephalic and atraumatic.  Mouth/Throat: Oropharynx is clear and moist.  Eyes: Conjunctivae and EOM are normal.  Pupils are equal, round, and reactive to light.  Neck: Normal range of motion. Neck supple. No JVD present. Carotid bruit is not present. No thyromegaly present.  Cardiovascular: Normal rate, regular rhythm, normal heart sounds and intact distal pulses.  Exam reveals no gallop.   Pulmonary/Chest: Effort normal and breath sounds normal. No respiratory distress. She has no wheezes. She has no rales.  No crackles noted Good air exch   Abdominal: Soft. Bowel sounds are normal. She exhibits no distension, no abdominal bruit and no mass. There is no tenderness.  Musculoskeletal: She exhibits no edema.  No pedal edema today  Lymphadenopathy:    She has no cervical adenopathy.  Neurological: She is alert. She has normal reflexes. She displays no tremor. No cranial nerve deficit. She exhibits normal muscle tone. Coordination normal.  Skin: Skin is warm and dry. No rash noted. No pallor.  Psychiatric: She has a normal mood and affect.          Assessment & Plan:   Problem List Items Addressed This Visit      Cardiovascular and Mediastinum   CHF (congestive heart failure) ([place])    For cardiology f/u soon      Relevant Medications   carvedilol (COREG) 3.125 MG tablet   simvastatin (ZOCOR) 20 MG tablet   Chronic diastolic heart failure ([place])    Pt says she has had more ankle edema lately along with occ palpitations Will f/u with cardiology as planned No edema today      Relevant Medications   carvedilol (COREG) 3.125 MG tablet   simvastatin (ZOCOR) 20 MG tablet   Essential hypertension - Primary    bp in fair control at this time  BP Readings from Last 1 Encounters:  07/30/16 130/80   No changes needed Disc lifstyle change with low sodium diet and exercise  Better on 2nd check today-states feeling very stressed       Relevant Medications   carvedilol (COREG) 3.125 MG tablet   simvastatin (ZOCOR) 20 MG tablet     Endocrine   Papillary thyroid carcinoma ([place])    Doing well    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      Relevant Medications   carvedilol (COREG) 3.125 MG tablet   Uncontrolled type 2 diabetes mellitus with peripheral neuropathy ([place]) (Chronic)    Seeing Dr Cruzita Lederer  Working on lifestyle habits Lab Results  Component Value Date   HGBA1C 7.4 07/01/2016         Relevant Medications   buPROPion (WELLBUTRIN XL) 150 MG 24 hr tablet   sertraline (ZOLOFT) 100  MG tablet   simvastatin (ZOCOR) 20 MG tablet     Other   Depression with anxiety    Stable despite stressors with wellbutrin and zoloft No changes made Reviewed stressors/ coping techniques/symptoms/ support sources/ tx options and side effects in detail today Enc outdoor time and exercise as tolerated      HYPERLIPIDEMIA    Disc goals for lipids and reasons to control them Rev labs with pt Rev low sat fat diet in detail Very close to goal with simvastatin and diet (LDL 71)      Relevant Medications   carvedilol (COREG) 3.125 MG tablet   simvastatin (ZOCOR) 20 MG tablet   OBESITY, MORBID    Discussed how this problem influences overall health and the risks it imposes  Reviewed plan for weight loss with lower calorie diet (via better food choices and also portion control or program like weight watchers) and exercise building up to or more than 30 minutes 5 days per week including some aerobic activity

## 2016-07-30 NOTE — Patient Instructions (Signed)
Blood pressure is improved on 2nd check Take care of yourself  Try to eat healthy  Come up with some non eating alternatives for stress   Keep walking- the more the better -good for every part of you / mind and body   See the cardiologist as planned

## 2016-07-30 NOTE — Progress Notes (Signed)
Pre visit review using our clinic review tool, if applicable. No additional management support is needed unless otherwise documented below in the visit note. 

## 2016-08-05 ENCOUNTER — Other Ambulatory Visit: Payer: Self-pay | Admitting: Internal Medicine

## 2016-08-06 ENCOUNTER — Other Ambulatory Visit: Payer: Self-pay | Admitting: Internal Medicine

## 2016-08-06 ENCOUNTER — Encounter: Payer: Self-pay | Admitting: Family Medicine

## 2016-08-06 MED ORDER — METFORMIN HCL 1000 MG PO TABS: ORAL_TABLET | ORAL | 3 refills | Status: DC

## 2016-08-14 ENCOUNTER — Ambulatory Visit (HOSPITAL_COMMUNITY)
Admission: RE | Admit: 2016-08-14 | Discharge: 2016-08-14 | Disposition: A | Discharge status: Home / Self Care | Payer: BLUE CROSS/BLUE SHIELD | Source: Ambulatory Visit | Attending: Internal Medicine | Admitting: Internal Medicine

## 2016-08-14 ENCOUNTER — Encounter (HOSPITAL_COMMUNITY): Payer: Self-pay | Admitting: Internal Medicine

## 2016-08-14 VITALS — BP 156/84 | HR 66 | Wt 275.5 lb

## 2016-08-14 DIAGNOSIS — G4733 Obstructive sleep apnea (adult) (pediatric): Secondary | ICD-10-CM | POA: Diagnosis not present

## 2016-08-14 DIAGNOSIS — E785 Hyperlipidemia, unspecified: Secondary | ICD-10-CM | POA: Diagnosis not present

## 2016-08-14 DIAGNOSIS — Z7951 Long term (current) use of inhaled steroids: Secondary | ICD-10-CM | POA: Diagnosis not present

## 2016-08-14 DIAGNOSIS — E282 Polycystic ovarian syndrome: Secondary | ICD-10-CM | POA: Insufficient documentation

## 2016-08-14 DIAGNOSIS — Z6841 Body Mass Index (BMI) 40.0 and over, adult: Secondary | ICD-10-CM | POA: Diagnosis not present

## 2016-08-14 DIAGNOSIS — Z794 Long term (current) use of insulin: Secondary | ICD-10-CM | POA: Diagnosis not present

## 2016-08-14 DIAGNOSIS — F329 Major depressive disorder, single episode, unspecified: Secondary | ICD-10-CM | POA: Insufficient documentation

## 2016-08-14 DIAGNOSIS — I2721 Secondary pulmonary arterial hypertension: Secondary | ICD-10-CM | POA: Diagnosis not present

## 2016-08-14 DIAGNOSIS — E890 Postprocedural hypothyroidism: Secondary | ICD-10-CM | POA: Insufficient documentation

## 2016-08-14 DIAGNOSIS — E1122 Type 2 diabetes mellitus with diabetic chronic kidney disease: Secondary | ICD-10-CM | POA: Diagnosis not present

## 2016-08-14 DIAGNOSIS — N189 Chronic kidney disease, unspecified: Secondary | ICD-10-CM | POA: Insufficient documentation

## 2016-08-14 DIAGNOSIS — F419 Anxiety disorder, unspecified: Secondary | ICD-10-CM | POA: Insufficient documentation

## 2016-08-14 DIAGNOSIS — I272 Pulmonary hypertension, unspecified: Secondary | ICD-10-CM | POA: Insufficient documentation

## 2016-08-14 DIAGNOSIS — K219 Gastro-esophageal reflux disease without esophagitis: Secondary | ICD-10-CM | POA: Diagnosis not present

## 2016-08-14 DIAGNOSIS — I10 Essential (primary) hypertension: Secondary | ICD-10-CM | POA: Diagnosis not present

## 2016-08-14 DIAGNOSIS — I130 Hypertensive heart and chronic kidney disease with heart failure and stage 1 through stage 4 chronic kidney disease, or unspecified chronic kidney disease: Secondary | ICD-10-CM | POA: Insufficient documentation

## 2016-08-14 DIAGNOSIS — Z79899 Other long term (current) drug therapy: Secondary | ICD-10-CM | POA: Insufficient documentation

## 2016-08-14 DIAGNOSIS — Z85850 Personal history of malignant neoplasm of thyroid: Secondary | ICD-10-CM | POA: Insufficient documentation

## 2016-08-14 DIAGNOSIS — I5032 Chronic diastolic (congestive) heart failure: Secondary | ICD-10-CM | POA: Insufficient documentation

## 2016-08-14 MED ORDER — LOSARTAN POTASSIUM 50 MG PO TABS: 50.0000 mg | ORAL_TABLET | Freq: Every day | ORAL | 3 refills | Status: DC

## 2016-08-14 NOTE — Patient Instructions (Signed)
Start Losartan 50 mg daily  Your physician has requested that you have an echocardiogram. Echocardiography is a painless test that uses sound waves to create images of your heart. It provides your doctor with information about the size and shape of your heart and how well your heart's chambers and valves are working. This procedure takes approximately one hour. There are no restrictions for this procedure.  We will contact you in 6 months to schedule your next appointment.

## 2016-08-14 NOTE — Progress Notes (Signed)
ADVANCED HF CLINIC NOTE  Patient ID: MAANASA ADERHOLD, female   DOB: [birth year], 51 y.o.   MRN: [id number]  PCP: Dr Glori Bickers Pulmonary: Dr. Lamonte Sakai ENT: Dr Norma Fredrickson Endocrinologist: Dr Renne Crigler  HPI: Ms. Dorado is a 51 y/o F hx of DM, HTN, HL, morbid obesity, OSA noncompliant with CPAP due to finances, anxiety, diastolic HF with secondary pulmonary HTN and noncardiac CP. Cath 2006 for CP with only catheter-induced spasm of RCA otherwise normal coronaries. EF was 65% at that time. Developed acute renal failure after CT contrast.   Nuclear Stress 02/17/12: Normal, EF 58%  Echo 10/24: EF 50-55%. Grade 2 diastolic dysfunction. RV mildly dilated. PAPP 68 mmHg  08/18/12 ECHO EF 42% Grade 2 diastolic dysfunction. RV looks good. No significant TR to measure PA pressure.  Echo 7/15: EF 55-60% Normal RV Myoview 7/15: RF and perfusion normal   S/P Thyroidectomy January 2014.   She returns for f/u. Under a lot of stress as husband just had liver transplant. Breathing is ok. Gets SOB up steps. When she gets very stressed can have a panic attack with heart racing and SOB. Lasts just a few months. Occasional chest pressure. Weight stable at 275. Edema ok. No orthopnea or PND. Doing some walking with husband. Takes spiro every day and torsemide as needed. Wearing CPAP.    ROS: All systems negative except as listed in HPI, PMH and Problem List.  Past Medical History:  Diagnosis Date  . Allergy    SEASONAL  . Anemia    iron deficiency  . Anxiety   . Arthritis   . Asthma    a. 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  . Cellulitis and abscess of leg 02/2014  . CHF (congestive heart failure) ([place])   . Chronic kidney disease    /13  . Complication of anesthesia    DIFFICULT WAKING- 2011  . Depression    a. Stress reaction 08/2011 in multiple social stressors.  . Diabetes mellitus   . Dysrhythmia   . Gastritis   . GERD (gastroesophageal reflux disease)   .  Heart murmur   . Hemorrhoid   . Hyperlipidemia   . Hypertension   . Hyperthyroidism   . Insomnia   . Morbid obesity ([place])   . OSA (obstructive sleep apnea) 01/17/2012  . Papillary thyroid carcinoma ([place])   . PCOS (polycystic ovarian syndrome)   . Sleep apnea    C PAP  . Vertigo     Current Outpatient Prescriptions  Medication Sig Dispense Refill  . acetaminophen (TYLENOL) 325 MG tablet Take 1 tablet (325 mg total) by mouth every 4 (four) hours as needed (pain/fever).    Marland Kitchen albuterol (PROAIR HFA) 108 (90 BASE) MCG/ACT inhaler Inhale 2 puffs into the lungs every 4 (four) hours as needed. For wheezing 1 Inhaler 4  . B-D ULTRAFINE III SHORT PEN 31G X 8 MM MISC USE ONCE A DAY 100 each 1  . buPROPion (WELLBUTRIN XL) 150 MG 24 hr tablet Take 1 tablet (150 mg total) by mouth daily. 30 tablet 11  . carvedilol (COREG) 3.125 MG tablet TAKE 1 TABLET BY MOUTH TWICE A DAY WITH A MEAL 60 tablet 5  . cetirizine (ZYRTEC) 10 MG tablet TAKE 1 TABLET (10 MG TOTAL) BY MOUTH DAILY. 30 tablet 0  . ferrous sulfate 325 (65 FE) MG tablet Take 325 mg by mouth 2 (two) times daily.      . fluticasone (FLONASE) 50 MCG/ACT  nasal spray Place 2 sprays into both nostrils daily as needed for allergies. 16 g 11  . glucose blood (ONE TOUCH ULTRA TEST) test strip Use 2x a day 100 each 2  . Insulin Glargine (TOUJEO SOLOSTAR) 300 UNIT/ML SOPN Inject 90 Units into the skin at bedtime. 18 pen 1  . INVOKANA 300 MG TABS tablet TAKE 1 TABLET BY MOUTH EVERY DAY BEFORE BREAKFAST 30 tablet 5  . JANUVIA 100 MG tablet TAKE 1 TABLET BY MOUTH EVERY DAY 30 tablet 5  . levothyroxine (SYNTHROID, LEVOTHROID) 175 MCG tablet TAKE 1 TABLET BY MOUTH DAILY BEFORE BREAKFAST. 90 tablet 1  . metFORMIN (GLUCOPHAGE) 1000 MG tablet TAKE 1 TABLET BY MOUTH TWICE A DAY WITH A MEAL 180 tablet 3  . pantoprazole (PROTONIX) 40 MG tablet TAKE 1 TABLET BY MOUTH EVERY DAY 30 tablet 5  . sertraline (ZOLOFT) 100 MG tablet Take 1 tablet (100 mg total) by mouth  daily. 30 tablet 11  . simvastatin (ZOCOR) 20 MG tablet Take 1 tablet (20 mg total) by mouth every evening. 30 tablet 11  . spironolactone (ALDACTONE) 25 MG tablet TAKE 1 TABLET (25 MG TOTAL) BY MOUTH DAILY. 30 tablet 1  . TRAVATAN Z 0.004 % SOLN ophthalmic solution Place 1 drop into both eyes at bedtime.     Marland Kitchen zolpidem (AMBIEN) 10 MG tablet TAKE 1/2 TO 1 TABLET AT BEDTIME AS NEEDED 30 tablet 3  . KLOR-CON M20 20 MEQ tablet TAKE 1 TABLET BY MOUTH 2 TIMES A WEEK (TAKE WITH TORSEMIDE) (Patient not taking: Reported on 08/14/2016) 30 tablet 5  . torsemide (DEMADEX) 20 MG tablet Take 20 mg by mouth daily as needed (fluid).      No current facility-administered medications for this encounter.      PHYSICAL EXAM: Vitals:   08/14/16 1050  BP: (!) 156/84  Pulse: 66  SpO2: 98%  Weight: 275 lb 8 oz (125 kg)   General:  Well appearing. No resp difficulty HEENT: normal anicteric  Neck: supple. no obvious JVD. Carotids 2+ bilat; no bruits. No lymphadenopathy or thryomegaly appreciated. Cor: PMI nondisplaced. Regular rate & rhythm. 2/6 TR murmur Lungs: clear no wheeze  Abdomen: obese soft, NT/ND No hepatosplenomegaly. No bruits or masses. Good BS Extremities: no cyanosis, clubbing, rash, edema Neuro: alert & oriented x 3, cranial nerves grossly intact. moves all 4 extremities w/o difficulty. Affect pleasant   ASSESSMENT & PLAN: 1. Chronic diastolic heart failure - Volume status ok off torsemide. I reviewed previous echo and she has at least moderate PH. 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 Will repeat echo. May need RHC. Stressed need for weight loss 2. Pulmonary HTN - As above 3. Chest pain  - previous cath and Myoview ok 4. OSA  - Continue CPAP 5. Obesity - again stressed need for weight loss 6. HTN - Add losartan 50  7. Probable PFO on TTE  This is physiologic and seen in 30% of people. Right side is normal in size. No embolic phenomenon. No further w/u needed.     Bensimhon, DanielMD 11:09 AM

## 2016-08-16 ENCOUNTER — Other Ambulatory Visit: Payer: Self-pay | Admitting: Family Medicine

## 2016-08-20 ENCOUNTER — Other Ambulatory Visit: Payer: Self-pay

## 2016-08-20 MED ORDER — SITAGLIPTIN PHOSPHATE 100 MG PO TABS: 100.0000 mg | ORAL_TABLET | Freq: Every day | ORAL | 5 refills | Status: DC

## 2016-08-28 ENCOUNTER — Ambulatory Visit (HOSPITAL_COMMUNITY): Payer: BLUE CROSS/BLUE SHIELD | Attending: Cardiology

## 2016-08-28 ENCOUNTER — Other Ambulatory Visit: Payer: Self-pay

## 2016-08-28 DIAGNOSIS — I080 Rheumatic disorders of both mitral and aortic valves: Secondary | ICD-10-CM | POA: Insufficient documentation

## 2016-08-28 DIAGNOSIS — I5032 Chronic diastolic (congestive) heart failure: Secondary | ICD-10-CM | POA: Diagnosis not present

## 2016-08-28 DIAGNOSIS — E119 Type 2 diabetes mellitus without complications: Secondary | ICD-10-CM | POA: Insufficient documentation

## 2016-08-28 DIAGNOSIS — Z6841 Body Mass Index (BMI) 40.0 and over, adult: Secondary | ICD-10-CM | POA: Diagnosis not present

## 2016-08-28 DIAGNOSIS — I509 Heart failure, unspecified: Secondary | ICD-10-CM | POA: Diagnosis present

## 2016-08-28 DIAGNOSIS — E785 Hyperlipidemia, unspecified: Secondary | ICD-10-CM | POA: Insufficient documentation

## 2016-08-28 DIAGNOSIS — I110 Hypertensive heart disease with heart failure: Secondary | ICD-10-CM | POA: Diagnosis not present

## 2016-08-28 MED ORDER — PERFLUTREN LIPID MICROSPHERE
1.0000 mL | INTRAVENOUS | Status: AC | PRN
  Administered 2016-08-28: 3 mL via INTRAVENOUS

## 2016-09-11 ENCOUNTER — Telehealth (HOSPITAL_COMMUNITY): Payer: Self-pay | Admitting: *Deleted

## 2016-09-11 ENCOUNTER — Other Ambulatory Visit (HOSPITAL_COMMUNITY): Payer: Self-pay | Admitting: Internal Medicine

## 2016-09-11 NOTE — Telephone Encounter (Signed)
Pt given echo results 

## 2016-10-11 NOTE — Telephone Encounter (Signed)
error 

## 2016-10-17 ENCOUNTER — Other Ambulatory Visit: Payer: Self-pay | Admitting: Family Medicine

## 2016-10-17 NOTE — Telephone Encounter (Signed)
Rx called in as prescribed 

## 2016-10-17 NOTE — Telephone Encounter (Signed)
Last f/u was 07/30/16, last filled on 05/20/16 #30 tabs with 3 additional refills, please advise

## 2016-10-17 NOTE — Telephone Encounter (Signed)
Px written for call in   

## 2016-10-20 IMAGING — CT CT ABD-PELV W/O CM
2 of 4 series · 16 of 46 positions shown, 18 images · non-contrast
Comparison: Abdominal ultrasound January 26, 2012

CLINICAL DATA: Generalized abdominal pain. History of hyper
tension, diabetes, cholecystectomy, morbid obesity.

EXAM:
CT ABDOMEN AND PELVIS WITHOUT CONTRAST
TECHNIQUE: Multidetector CT imaging of the abdomen and pelvis was performed
following the standard protocol without IV contrast.

[Series 2: a/p w/o 5mm · axial · non-contrast · 0.90mm/px · z∈[+389,+839]mm · 13 of 104 slices shown, 15 images]
[im 7/104  soft-tissue]
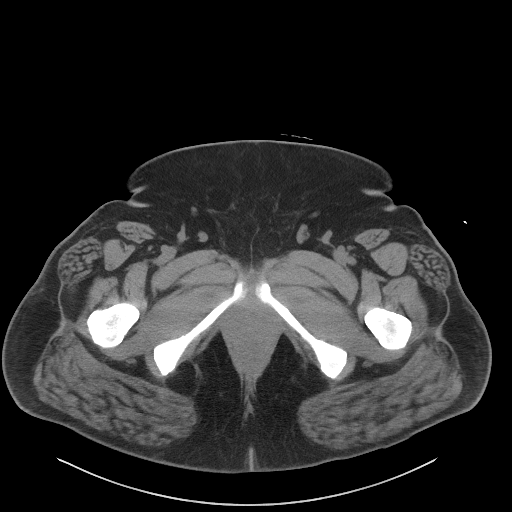
[im 7/104  bone]
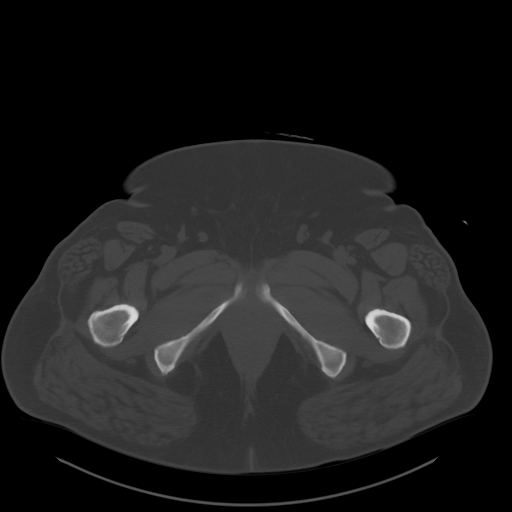
[im 13/104  soft-tissue]
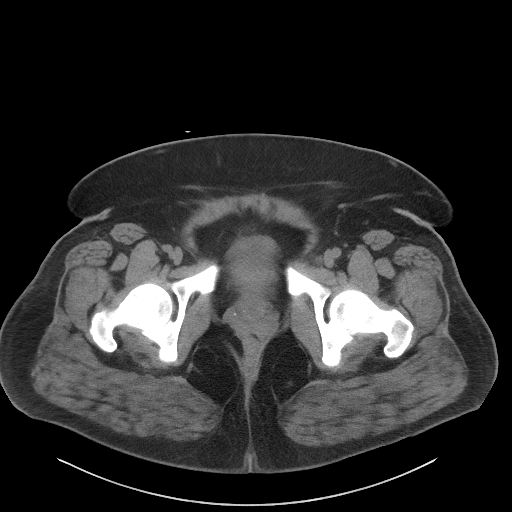
[im 25/104  soft-tissue]
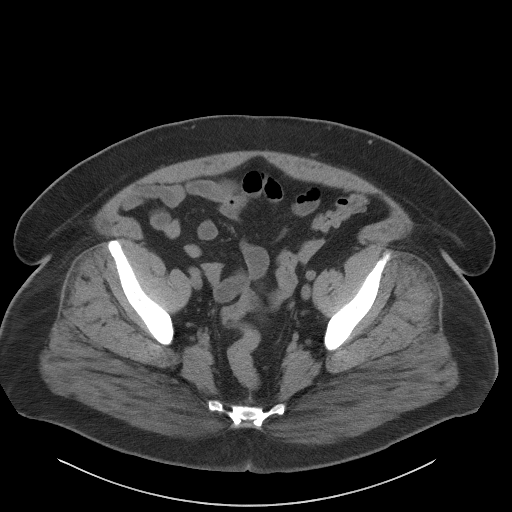
[im 31/104  soft-tissue]
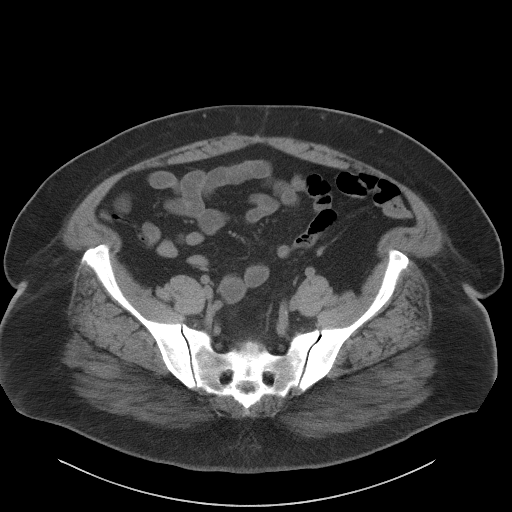
[im 37/104  soft-tissue]
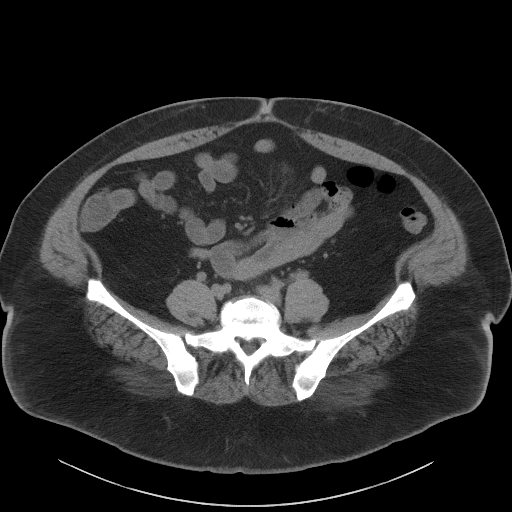
[im 43/104  soft-tissue]
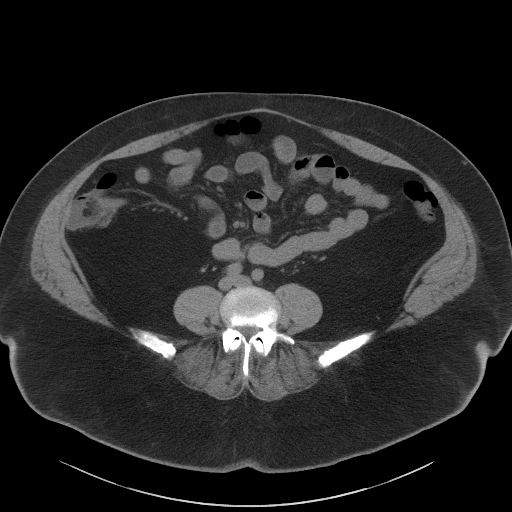
[im 55/104  soft-tissue]
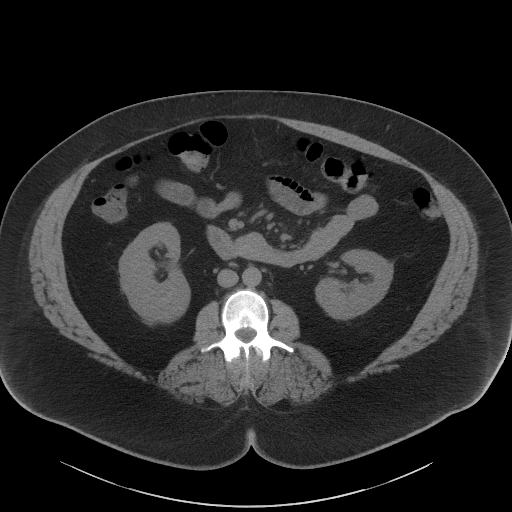
[im 61/104  soft-tissue]
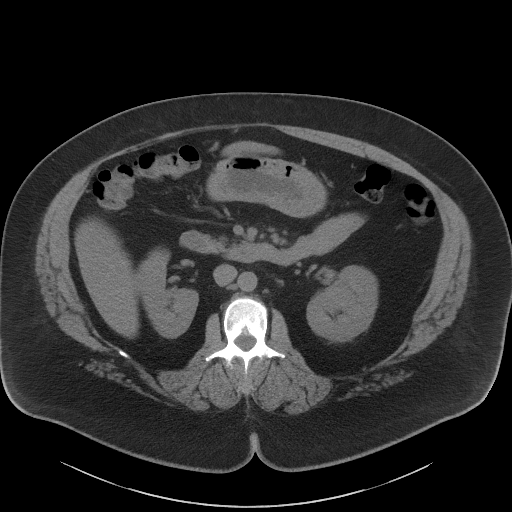
[im 67/104  soft-tissue]
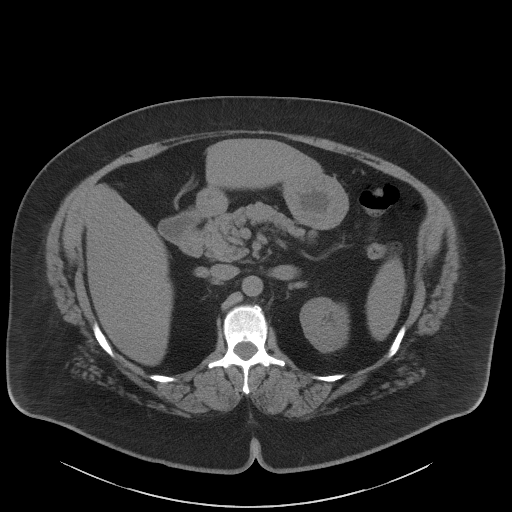
[im 67/104  bone]
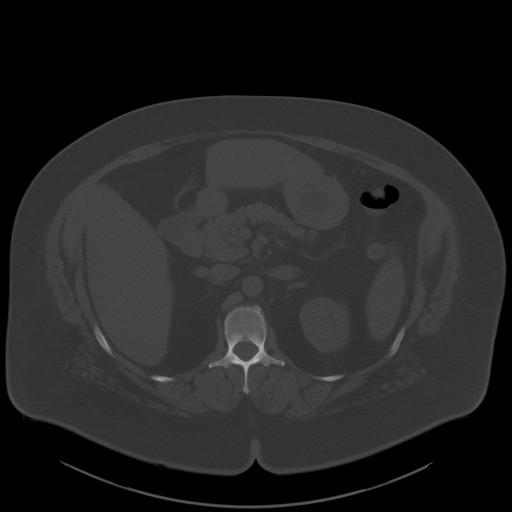
[im 73/104  soft-tissue]
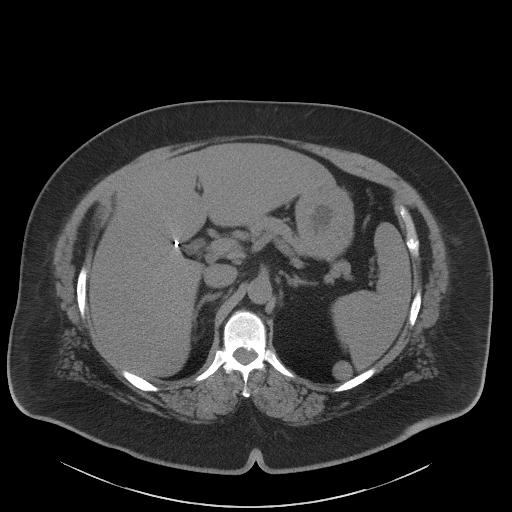
[im 79/104  soft-tissue]
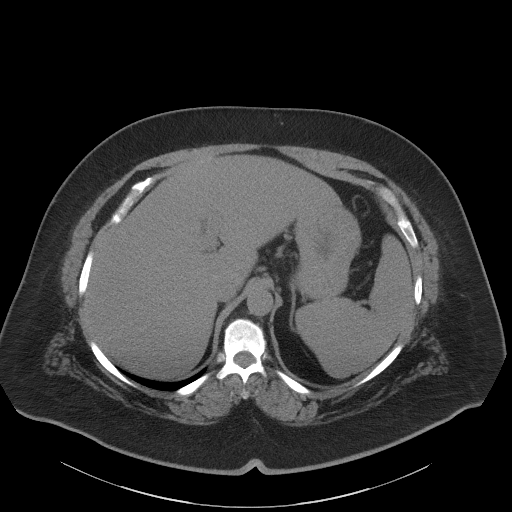
[im 91/104  soft-tissue]
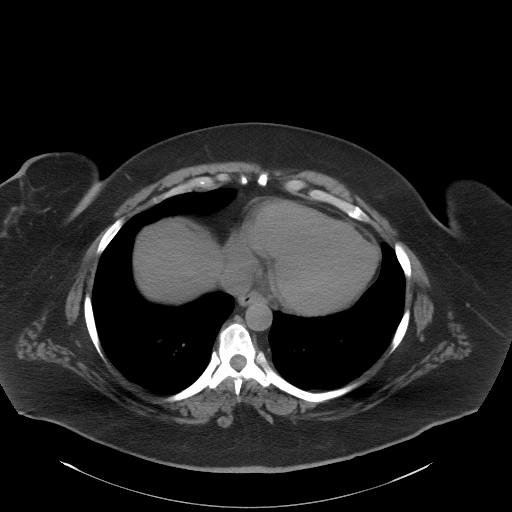
[im 97/104  soft-tissue]
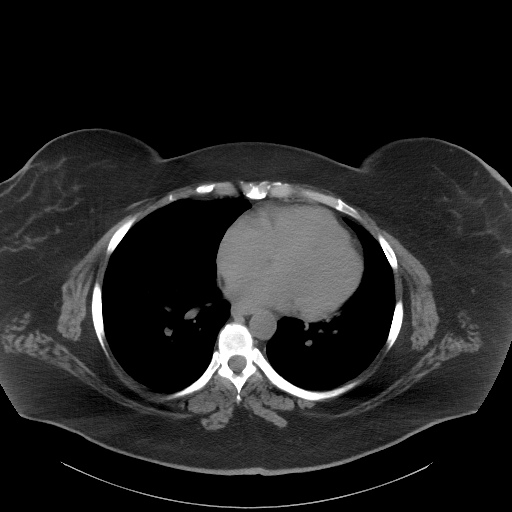

[Series 6: a/p w/o cor · coronal · non-contrast · 0.95mm/px · 3 of 149 slices shown]
[im 50/149  soft-tissue]
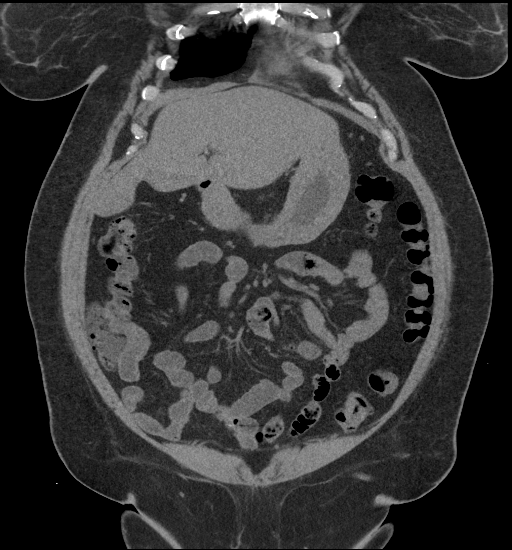
[im 66/149  soft-tissue]
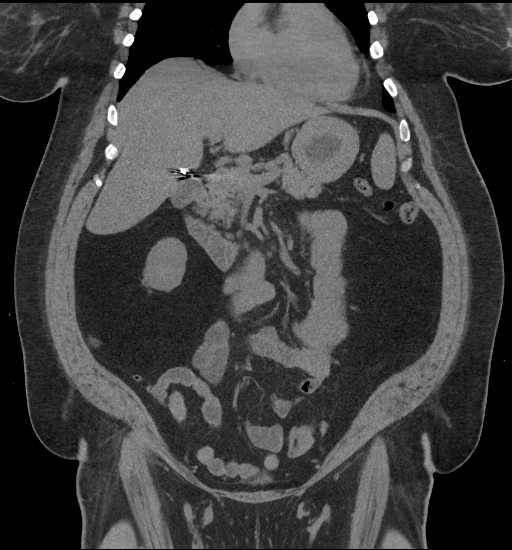
[im 83/149  soft-tissue]
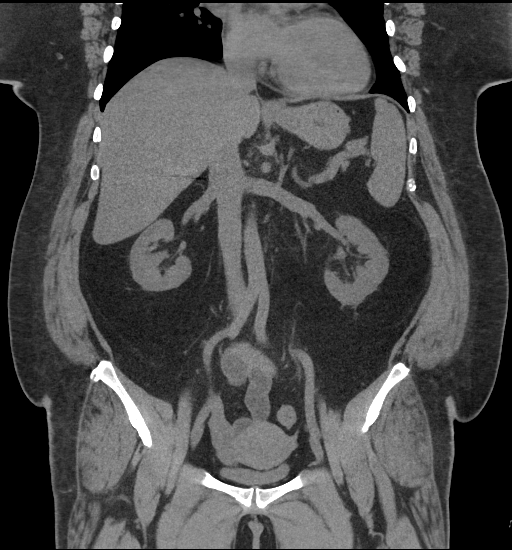

[16 of 46 positions shown; findings below may reference images not displayed]

FINDINGS: Large body habitus results in overall noisy image quality.

LUNG BASES: Included view of the lung bases are clear. The
visualized heart and pericardium are unremarkable.

KIDNEYS/BLADDER: Kidneys are orthotopic, demonstrating normal size
and morphology. No nephrolithiasis, hydronephrosis; limited
assessment for renal masses on this nonenhanced examination. The
unopacified ureters are normal in course and caliber. Urinary
bladder is decompressed and unremarkable.

SOLID ORGANS: The liver, spleen, pancreas and adrenal glands are
unremarkable for this non-contrast examination. Status post
cholecystectomy.

GASTROINTESTINAL TRACT: The stomach is decompressed. Small and large
bowel are normal in course and caliber without inflammatory changes
though sensitivity is decreased by lack of oral contrast. Normal
appendix.

PERITONEUM/RETROPERITONEUM: Aortoiliac vessels are normal in course
and caliber. No lymphadenopathy by CT size criteria. Multiple small
central mesenteric lymph nodes are likely reactive with "misty"
mesentery. Internal reproductive organs are unremarkable. No
intraperitoneal free fluid nor free air.

SOFT TISSUES/ OSSEOUS STRUCTURES: Nonsuspicious. Moderate sacroiliac
osteoarthrosis. Severe L5-S1 disc height loss with large broad-based
disc bulge. Moderate to severe lower lumbar facet arthropathy.
Moderate to severe bilateral L5-S1 neural foraminal narrowing
IMPRESSION: No urolithiasis, obstructive uropathy or acute
intra-abdominal/pelvic process.

## 2016-10-22 ENCOUNTER — Ambulatory Visit: Payer: BLUE CROSS/BLUE SHIELD | Admitting: Internal Medicine

## 2016-10-22 ENCOUNTER — Telehealth: Payer: Self-pay | Admitting: Internal Medicine

## 2016-10-22 DIAGNOSIS — Z0289 Encounter for other administrative examinations: Secondary | ICD-10-CM

## 2016-10-22 NOTE — Telephone Encounter (Signed)
3mo

## 2016-10-22 NOTE — Telephone Encounter (Signed)
Patient no showed today's appt. Please advise on how to follow up. °A. No follow up necessary. °B. Follow up urgent. Contact patient immediately. °C. Follow up necessary. Contact patient and schedule visit in ___ days. °D. Follow up advised. Contact patient and schedule visit in ____weeks. ° °

## 2016-11-17 ENCOUNTER — Other Ambulatory Visit: Payer: Self-pay | Admitting: Internal Medicine

## 2016-11-20 ENCOUNTER — Other Ambulatory Visit: Payer: Self-pay | Admitting: Internal Medicine

## 2016-11-21 NOTE — Telephone Encounter (Signed)
Please advise if ok to refill. Medication is not on current med list. Thanks!  

## 2016-11-28 ENCOUNTER — Other Ambulatory Visit: Payer: Self-pay | Admitting: Pediatrics

## 2016-11-28 NOTE — Telephone Encounter (Signed)
Pt last seen 07/01/16, she N/S 10/22/16 f/u appt with you. Would you like to refill?

## 2016-11-29 ENCOUNTER — Telehealth: Payer: Self-pay | Admitting: Internal Medicine

## 2016-11-29 NOTE — Telephone Encounter (Signed)
MEDICATION: INVOKANA 300 MG TABS tablet  PHARMACY:    [id number] [place], [place] - [address] [phone] (Phone) [phone] (Fax)     IS THIS A 90 DAY SUPPLY : N  IS PATIENT OUT OF MEDICATION: Y  IF NOT; HOW MUCH IS LEFT:   LAST APPOINTMENT DATE: 10/22/16  NEXT APPOINTMENT DATE: N/A  OTHER COMMENTS:    **Let patient know to contact pharmacy at the end of the day to make sure medication is ready. **  ** Please notify patient to allow 48-72 hours to process**  **Encourage patient to contact the pharmacy for refills or they can request refills through [facility]**

## 2016-11-29 NOTE — Telephone Encounter (Signed)
Routing to you °

## 2016-12-02 ENCOUNTER — Other Ambulatory Visit: Payer: Self-pay

## 2016-12-02 MED ORDER — INVOKANA 300 MG PO TABS: 300.0000 mg | ORAL_TABLET | Freq: Every day | ORAL | 5 refills | Status: DC

## 2016-12-02 NOTE — Telephone Encounter (Signed)
Submitted

## 2016-12-21 ENCOUNTER — Other Ambulatory Visit: Payer: Self-pay | Admitting: Internal Medicine

## 2017-01-10 ENCOUNTER — Other Ambulatory Visit: Payer: Self-pay | Admitting: Family Medicine

## 2017-01-10 NOTE — Telephone Encounter (Signed)
Last refill 10/17/16 Last OV 07/30/16  Ok to refill?

## 2017-01-12 NOTE — Telephone Encounter (Signed)
Px written for call in   

## 2017-01-13 ENCOUNTER — Encounter: Payer: Self-pay | Admitting: Family Medicine

## 2017-01-13 NOTE — Telephone Encounter (Signed)
Rx called in as prescribed 

## 2017-01-14 ENCOUNTER — Telehealth: Payer: Self-pay | Admitting: Family Medicine

## 2017-01-14 NOTE — Telephone Encounter (Signed)
appt scheduled with Dr. Glori Bickers

## 2017-01-14 NOTE — Telephone Encounter (Signed)
Copied from [place] #[id number]. Topic: Quick Communication - See Telephone Encounter >> Jan 14, 2017  3:06 PM Ether Griffins B wrote: CRM for notification. See Telephone encounter for:  01/14/17.  Pt is wanting a z pack called in. Yearly sinuses

## 2017-01-14 NOTE — Telephone Encounter (Signed)
Please see 01/13/17 pt email thank you.

## 2017-01-14 NOTE — Telephone Encounter (Signed)
Please schedule her for a visit with first available  I cannot tx with abx or steroids without evaluation  Thanks

## 2017-01-15 ENCOUNTER — Ambulatory Visit (INDEPENDENT_AMBULATORY_CARE_PROVIDER_SITE_OTHER): Payer: BLUE CROSS/BLUE SHIELD | Admitting: Family Medicine

## 2017-01-15 ENCOUNTER — Ambulatory Visit: Payer: BLUE CROSS/BLUE SHIELD | Admitting: Family Medicine

## 2017-01-15 ENCOUNTER — Encounter: Payer: Self-pay | Admitting: Family Medicine

## 2017-01-15 VITALS — BP 120/68 | HR 71 | Temp 98.5°F | Wt 273.2 lb

## 2017-01-15 DIAGNOSIS — IMO0002 Reserved for concepts with insufficient information to code with codable children: Secondary | ICD-10-CM

## 2017-01-15 DIAGNOSIS — E1142 Type 2 diabetes mellitus with diabetic polyneuropathy: Secondary | ICD-10-CM | POA: Diagnosis not present

## 2017-01-15 DIAGNOSIS — J019 Acute sinusitis, unspecified: Secondary | ICD-10-CM | POA: Diagnosis not present

## 2017-01-15 DIAGNOSIS — B9689 Other specified bacterial agents as the cause of diseases classified elsewhere: Secondary | ICD-10-CM | POA: Diagnosis not present

## 2017-01-15 DIAGNOSIS — E1165 Type 2 diabetes mellitus with hyperglycemia: Secondary | ICD-10-CM | POA: Diagnosis not present

## 2017-01-15 MED ORDER — AMOXICILLIN-POT CLAVULANATE 875-125 MG PO TABS: 1.0000 | ORAL_TABLET | Freq: Two times a day (BID) | ORAL | 0 refills | Status: AC

## 2017-01-15 MED ORDER — BENZONATATE 100 MG PO CAPS: 100.0000 mg | ORAL_CAPSULE | Freq: Three times a day (TID) | ORAL | 0 refills | Status: DC | PRN

## 2017-01-15 NOTE — Progress Notes (Signed)
BP 120/68 (BP Location: Left Arm, Patient Position: Sitting, Cuff Size: Large)   Pulse 71   Temp 98.5 F (36.9 C) (Oral)   Wt 273 lb 4 oz (123.9 kg)   SpO2 97%   BMI 45.82 kg/m    CC: cough Subjective:    Patient ID: [name], female    DOB: 01-Sep-1965, 51 y.o.   MRN: [id number]  HPI: [name] is a 51 y.o. female presenting on 01/15/2017 for Cough (mainly in mornings, productive. Started about 2 wks ago as a sore throat then drainage. Tried Mucinex and Flonase)   She did get flu shot 01/03/2017 - since then increasing sore throat, PNdrainage, colored mucous from nose and productive cough. L muffled hearing. Facial pain and maxillary sinus pressure. Sore throat has improved. Symptoms worse at night time.   No fevers/chills, ear or tooth pain, headache, dyspnea or wheezing.   Has been treating with mucinex, flonase.  grandkids at home.  No smokers at home.   H/o asthma, no recent acute flare. No recent need for albuterol OSA on CPAP.  DM - compliant with meds. Doesn't check sugars. Due for f/u with Dr Cruzita Lederer. Lab Results  Component Value Date   HGBA1C 7.4 07/01/2016    Relevant past medical, surgical, family and social history reviewed and updated as indicated. Interim medical history since our last visit reviewed. Allergies and medications reviewed and updated. Outpatient Medications Prior to Visit  Medication Sig Dispense Refill  . acetaminophen (TYLENOL) 325 MG tablet Take 1 tablet (325 mg total) by mouth every 4 (four) hours as needed (pain/fever).    Marland Kitchen albuterol (PROAIR HFA) 108 (90 BASE) MCG/ACT inhaler Inhale 2 puffs into the lungs every 4 (four) hours as needed. For wheezing 1 Inhaler 4  . B-D ULTRAFINE III SHORT PEN 31G X 8 MM MISC USE ONCE A DAY 100 each 1  . buPROPion (WELLBUTRIN XL) 150 MG 24 hr tablet Take 1 tablet (150 mg total) by mouth daily. 30 tablet 11  . carvedilol (COREG) 3.125 MG tablet TAKE 1 TABLET BY MOUTH TWICE A DAY WITH A MEAL 60  tablet 5  . cetirizine (ZYRTEC) 10 MG tablet TAKE 1 TABLET BY MOUTH EVERY DAY 30 tablet 5  . ferrous sulfate 325 (65 FE) MG tablet Take 325 mg by mouth 2 (two) times daily.      . fluticasone (FLONASE) 50 MCG/ACT nasal spray Place 2 sprays into both nostrils daily as needed for allergies. 16 g 11  . glucose blood (ONE TOUCH ULTRA TEST) test strip Use 2x a day 100 each 2  . INVOKANA 300 MG TABS tablet Take 1 tablet (300 mg total) by mouth daily before breakfast. 30 tablet 5  . KLOR-CON M20 20 MEQ tablet TAKE 1 TABLET BY MOUTH 2 TIMES A WEEK (TAKE WITH TORSEMIDE) 30 tablet 5  . levothyroxine (SYNTHROID, LEVOTHROID) 175 MCG tablet TAKE 1 TABLET BY MOUTH DAILY BEFORE BREAKFAST. 90 tablet 1  . metFORMIN (GLUCOPHAGE) 1000 MG tablet TAKE 1 TABLET BY MOUTH TWICE A DAY WITH A MEAL 180 tablet 3  . pantoprazole (PROTONIX) 40 MG tablet TAKE 1 TABLET BY MOUTH EVERY DAY 30 tablet 5  . sertraline (ZOLOFT) 100 MG tablet Take 1 tablet (100 mg total) by mouth daily. 30 tablet 11  . simvastatin (ZOCOR) 20 MG tablet Take 1 tablet (20 mg total) by mouth every evening. 30 tablet 11  . sitaGLIPtin (JANUVIA) 100 MG tablet Take 1 tablet (100 mg total) by mouth  daily. 30 tablet 5  . spironolactone (ALDACTONE) 25 MG tablet TAKE 1 TABLET (25 MG TOTAL) BY MOUTH DAILY. 30 tablet 11  . torsemide (DEMADEX) 20 MG tablet Take 20 mg by mouth daily as needed (fluid).     . TOUJEO SOLOSTAR 300 UNIT/ML SOPN INJECT 90 UNITS INTO THE SKIN AT BEDTIME. 27 pen 1  . TRAVATAN Z 0.004 % SOLN ophthalmic solution Place 1 drop into both eyes at bedtime.     Marland Kitchen zolpidem (AMBIEN) 10 MG tablet TAKE 1/2 TO 1 TABLET AT BEDTIME AS NEEDED 30 tablet 3  . sitaGLIPtin (JANUVIA) 100 MG tablet Take 1 tablet (100 mg total) by mouth daily. 30 tablet 5  . losartan (COZAAR) 50 MG tablet Take 1 tablet (50 mg total) by mouth daily. 90 tablet 3   No facility-administered medications prior to visit.      Per HPI unless specifically indicated in ROS section  below Review of Systems     Objective:    BP 120/68 (BP Location: Left Arm, Patient Position: Sitting, Cuff Size: Large)   Pulse 71   Temp 98.5 F (36.9 C) (Oral)   Wt 273 lb 4 oz (123.9 kg)   SpO2 97%   BMI 45.82 kg/m   Wt Readings from Last 3 Encounters:  01/15/17 273 lb 4 oz (123.9 kg)  08/14/16 275 lb 8 oz (125 kg)  07/30/16 277 lb (125.6 kg)    Physical Exam  Constitutional: She appears well-developed and well-nourished. No distress.  HENT:  Head: Normocephalic and atraumatic.  Right Ear: Hearing, tympanic membrane, external ear and ear canal normal.  Left Ear: Hearing, tympanic membrane, external ear and ear canal normal.  Nose: Mucosal edema present. No rhinorrhea. Right sinus exhibits maxillary sinus tenderness (and ethmoid). Right sinus exhibits no frontal sinus tenderness. Left sinus exhibits maxillary sinus tenderness (and ethmoid). Left sinus exhibits no frontal sinus tenderness.  Mouth/Throat: Uvula is midline, oropharynx is clear and moist and mucous membranes are normal. No oropharyngeal exudate, posterior oropharyngeal edema, posterior oropharyngeal erythema or tonsillar abscesses.  Eyes: Pupils are equal, round, and reactive to light. Conjunctivae and EOM are normal. No scleral icterus.  Neck: Normal range of motion. Neck supple.  Cardiovascular: Normal rate, regular rhythm, normal heart sounds and intact distal pulses.   No murmur heard. Pulmonary/Chest: Effort normal and breath sounds normal. No respiratory distress. She has no wheezes. She has no rales.  Slightly coarse  Lymphadenopathy:    She has no cervical adenopathy.  Skin: Skin is warm and dry. No rash noted.  Nursing note and vitals reviewed.  Results for orders placed or performed in visit on 07/26/16  CBC with Differential/Platelet  Result Value Ref Range   WBC 8.6 4.0 - 10.5 K/uL   RBC 4.65 3.87 - 5.11 Mil/uL   Hemoglobin 13.1 12.0 - 15.0 g/dL   HCT 40.2 36.0 - 46.0 %   MCV 86.5 78.0 - 100.0  fl   MCHC 32.5 30.0 - 36.0 g/dL   RDW 16.3 (H) 11.5 - 15.5 %   Platelets 277.0 150.0 - 400.0 K/uL   Neutrophils Relative % 72.3 43.0 - 77.0 %   Lymphocytes Relative 17.4 12.0 - 46.0 %   Monocytes Relative 4.7 3.0 - 12.0 %   Eosinophils Relative 4.5 0.0 - 5.0 %   Basophils Relative 1.1 0.0 - 3.0 %   Neutro Abs 6.2 1.4 - 7.7 K/uL   Lymphs Abs 1.5 0.7 - 4.0 K/uL   Monocytes Absolute 0.4 0.1 -  1.0 K/uL   Eosinophils Absolute 0.4 0.0 - 0.7 K/uL   Basophils Absolute 0.1 0.0 - 0.1 K/uL  Comprehensive metabolic panel  Result Value Ref Range   Sodium 138 135 - 145 mEq/L   Potassium 3.9 3.5 - 5.1 mEq/L   Chloride 103 96 - 112 mEq/L   CO2 28 19 - 32 mEq/L   Glucose, Bld 176 (H) 70 - 99 mg/dL   BUN 11 6 - 23 mg/dL   Creatinine, Ser 0.89 0.40 - 1.20 mg/dL   Total Bilirubin 0.2 0.2 - 1.2 mg/dL   Alkaline Phosphatase 84 39 - 117 U/L   AST 18 0 - 37 U/L   ALT 19 0 - 35 U/L   Total Protein 7.2 6.0 - 8.3 g/dL   Albumin 4.5 3.5 - 5.2 g/dL   Calcium 9.5 8.4 - 10.5 mg/dL   GFR 71.14 >60.00 mL/min  TSH  Result Value Ref Range   TSH 14.73 (H) 0.35 - 4.50 uIU/mL  Lipid panel  Result Value Ref Range   Cholesterol 135 0 - 200 mg/dL   Triglycerides 265.0 (H) 0.0 - 149.0 mg/dL   HDL 33.40 (L) >39.00 mg/dL   VLDL 53.0 (H) 0.0 - 40.0 mg/dL   Total CHOL/HDL Ratio 4    NonHDL 101.11   Ferritin  Result Value Ref Range   Ferritin 16.2 10.0 - 291.0 ng/mL  LDL cholesterol, direct  Result Value Ref Range   Direct LDL 81.0 mg/dL      Assessment & Plan:   Problem List Items Addressed This Visit    Acute bacterial sinusitis - Primary    Anticipate bacterial sinusitis given sxs and duration of illness. Treat with augmentin course and tessalon perls, fluids and rest. rec continue mucinex, flonase, and nasal saline irrigation. Update if not improving with treatment.       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           Follow up plan: No Follow-up on file.  Ria Bush, MD

## 2017-01-15 NOTE — Patient Instructions (Signed)
You have a sinus infection, likely bacterial given prolonged duration.  Take medicine as prescribed: augmentin 10 day course.  Push fluids and plenty of rest.  Nasal saline irrigation or neti pot to help drain sinuses.  Continue flonase.  Tessalon perls for cough.  May use plain mucinex with plenty of fluid to help mobilize mucous. Please let us know if fever >101.5, trouble opening/closing mouth, difficulty swallowing, or worsening instead of improving as expected.  Diflucan sent to use after augmentin course if needed for yeast infection.

## 2017-01-15 NOTE — Assessment & Plan Note (Signed)
Encouraged f/u with endo as overdue.

## 2017-01-15 NOTE — Assessment & Plan Note (Signed)
Anticipate bacterial sinusitis given sxs and duration of illness. Treat with augmentin course and tessalon perls, fluids and rest. rec continue mucinex, flonase, and nasal saline irrigation. Update if not improving with treatment.

## 2017-01-19 ENCOUNTER — Other Ambulatory Visit: Payer: Self-pay | Admitting: Internal Medicine

## 2017-01-31 ENCOUNTER — Telehealth: Payer: Self-pay | Admitting: Family Medicine

## 2017-01-31 NOTE — Telephone Encounter (Signed)
Patient advised.

## 2017-01-31 NOTE — Telephone Encounter (Signed)
Copied from [place] [id number]. Topic: Quick Communication - See Telephone Encounter >> Jan 31, 2017 10:38 AM Arletha Grippe wrote: CRM for notification. See Telephone encounter for:   01/31/17. Pt calling - she finished abx for sinus and was feeling better, but started feeling worse Tuesday.  She thinks that she has sinus infection back. She is clogged up, sinus pressure, yellow and green snot. She says it is all in her face, not in her chest.  [facility] [address] [facility] [place].  Cb number for today is [phone].   Also patient needs medication for yeast infection

## 2017-01-31 NOTE — Telephone Encounter (Signed)
Pt last seen 01/15/17.

## 2017-01-31 NOTE — Telephone Encounter (Signed)
Needs to get rechecked/OV.  Thanks.  Offer Sat clinic.

## 2017-02-21 ENCOUNTER — Other Ambulatory Visit: Payer: Self-pay | Admitting: Family Medicine

## 2017-02-24 ENCOUNTER — Other Ambulatory Visit: Payer: Self-pay | Admitting: Internal Medicine

## 2017-02-24 NOTE — Telephone Encounter (Signed)
Please advise on refilling this medication. 

## 2017-02-25 NOTE — Telephone Encounter (Signed)
Please advise 

## 2017-02-25 NOTE — Telephone Encounter (Signed)
OK, but needs an appt soon

## 2017-03-25 ENCOUNTER — Other Ambulatory Visit: Payer: Self-pay | Admitting: Family Medicine

## 2017-04-22 ENCOUNTER — Other Ambulatory Visit: Payer: Self-pay | Admitting: Internal Medicine

## 2017-04-22 NOTE — Telephone Encounter (Signed)
LVM no refills until appt is made

## 2017-05-21 ENCOUNTER — Other Ambulatory Visit: Payer: Self-pay | Admitting: Family Medicine

## 2017-05-23 ENCOUNTER — Encounter: Payer: Self-pay | Admitting: Internal Medicine

## 2017-05-23 ENCOUNTER — Ambulatory Visit: Payer: BLUE CROSS/BLUE SHIELD | Admitting: Internal Medicine

## 2017-05-23 VITALS — BP 167/93 | HR 67 | Temp 98.2°F | Resp 16 | Ht 65.5 in | Wt 267.0 lb

## 2017-05-23 DIAGNOSIS — E890 Postprocedural hypothyroidism: Secondary | ICD-10-CM

## 2017-05-23 DIAGNOSIS — E1142 Type 2 diabetes mellitus with diabetic polyneuropathy: Secondary | ICD-10-CM | POA: Diagnosis not present

## 2017-05-23 DIAGNOSIS — IMO0002 Reserved for concepts with insufficient information to code with codable children: Secondary | ICD-10-CM

## 2017-05-23 DIAGNOSIS — E1165 Type 2 diabetes mellitus with hyperglycemia: Secondary | ICD-10-CM | POA: Diagnosis not present

## 2017-05-23 DIAGNOSIS — C73 Malignant neoplasm of thyroid gland: Secondary | ICD-10-CM | POA: Diagnosis not present

## 2017-05-23 LAB — TSH: TSH: 2.83 u[IU]/mL (ref 0.35–4.50)

## 2017-05-23 LAB — POCT GLYCOSYLATED HEMOGLOBIN (HGB A1C): HEMOGLOBIN A1C: 7.7

## 2017-05-23 LAB — T4, FREE: FREE T4: 0.94 ng/dL (ref 0.60–1.60)

## 2017-05-23 MED ORDER — SITAGLIPTIN PHOSPHATE 100 MG PO TABS: 100.0000 mg | ORAL_TABLET | Freq: Every day | ORAL | 5 refills | Status: DC

## 2017-05-23 MED ORDER — INVOKANA 300 MG PO TABS: 300.0000 mg | ORAL_TABLET | Freq: Every day | ORAL | 5 refills | Status: DC

## 2017-05-23 MED ORDER — FREESTYLE LIBRE 14 DAY SENSOR MISC: 1.0000 | 11 refills | Status: DC

## 2017-05-23 MED ORDER — FREESTYLE LIBRE 14 DAY READER DEVI: 1.0000 | Freq: Once | 1 refills | Status: AC

## 2017-05-23 MED ORDER — METFORMIN HCL 1000 MG PO TABS: ORAL_TABLET | ORAL | 3 refills | Status: DC

## 2017-05-23 NOTE — Progress Notes (Signed)
Patient ID: Monica [name], female   DOB: 10-Jul-1965, 52 y.o.   MRN: [id number]  HPI: Monica [name] is a 52 y.o.-year-old female, returning for f/u for DM2, dx 2008, insulin-dependent since ~2011, uncontrolled, with complications (peripheral neuropathy, dCHF). I am also following the pt for h/o papillary thyroid cancer and iatrogenic hypothyroidism. Last visit 10 months ago.  She is having an URI.  DM2: Last hemoglobin A1c was: Lab Results  Component Value Date   HGBA1C 7.4 07/01/2016   HGBA1C 7.2 (H) 06/16/2015   HGBA1C 8.2 (H) 11/03/2014    Pt is on a regimen of: - Lantus solostar >> Toujeo 80 >> 45 units qhs (times 2 injections at the same time) - Metformin 1000 mg 2x a day - Invokana 300 mg in a.m. - Januvia 100 mg in a.m. She was also on Glipizide but developed hypoglycemia >> loss of consciousness. She was also on Amaryl >> taken off.   Pt is not checking sugars. From last visit: - am: 123-160 >> 170-220 >> 140-160  - before lunch: 116-163 >> n/c - before dinner: 113 >> n/c - bedtime: 155-178, 211 >> n/c >> 160s Lowest sugar was 170 >> 120s >> ? Highest sugar was 275 >> 190 >> ?  Pt's meals are: - Breakfast: skips - 10 o'clock: banana, apple, nectarine - Lunch: fast food - Dinner: goes out to eat around 8 o'clock: chicken sandwich or a wrap - Snacks: 0, no concentrated sweets  -No CKD, last BUN/creatinine:  Lab Results  Component Value Date   BUN 11 07/26/2016   CREATININE 0.89 07/26/2016  Not on ACE inhibitor/ARB.  -+ HL; last set of lipids: Lab Results  Component Value Date   CHOL 135 07/26/2016   HDL 33.40 (L) 07/26/2016   LDLCALC 71 06/16/2015   LDLDIRECT 81.0 07/26/2016   TRIG 265.0 (H) 07/26/2016   CHOLHDL 4 07/26/2016  On simvastatin.  - last eye exam was in 06/2016: No DR. Has glaucoma - on eye drops.  -+ Numbness and tingling in her feet.  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  At that time, he was incidentally found that she had a subcentimeter focus of papillary thyroid cancer, follicular variant.  No radioactive iodine treatment was necessary at that time.    Most recent neck U/S (11/24/2014): Post thyroidectomy. No suspicious lymphadenopathy or soft tissue.  Pt is on levothyroxine 175 Mcg daily, taken: - daily now - in am - fasting - at least 30 min from b'fast - no Ca, MVI - Takes iron at lunch and at bedtime - Takes Protonix at lunchtime - No biotin  Reviewed TSH levels -levels were were very fluctuating due to medication noncompliance: Lab Results  Component Value Date   TSH 14.73 (H) 07/26/2016   TSH 9.17 (A) 04/02/2016   TSH 11.83 (H) 06/16/2015   TSH 1.82 11/03/2014   TSH 8.756 (H) 03/24/2014   TSH 2.96 02/14/2014   TSH 29.85 (H) 12/20/2013   TSH 1.86 09/04/2012   TSH 9.63 (H) 07/27/2012   TSH 9.74 Repeated and verified X2. (H) 06/26/2012   Pt denies: - feeling nodules in neck - hoarseness - choking - SOB with lying down  She has dysphagia  - PND.  ROS: Constitutional: no weight gain/no weight loss, + fatigue, + subjective hyperthermia, + subjective hypothermia Eyes: no blurry vision, no xerophthalmia ENT: no sore throat, + see HPI Cardiovascular: no CP/+ SOB/+ palpitations/+  leg swelling Respiratory: no cough/+ SOB/no wheezing Gastrointestinal: + Nausea, bloating, diarrhea, constipation Musculoskeletal: + Muscle aches/+ joint aches Skin: no rashes, + hair loss, + excessive hair growth Neurological: no tremors/+ numbness/+ tingling/no dizziness  I reviewed pt's medications, allergies, PMH, social hx, family hx, and changes were documented in the history of present illness. Otherwise, unchanged from my initial visit note.  Past Medical History:  Diagnosis Date  . Allergy    SEASONAL  . Anemia    iron deficiency  . Anxiety   . Arthritis   . Asthma    a. PFTs showing possible mild AFL 11/2010 but most  likely no evidence of asthma.  . Cellulitis and abscess of leg 02/2014  . CHF (congestive heart failure) ([place])   . Chronic kidney disease    /13  . Complication of anesthesia    DIFFICULT WAKING- 2011  . Depression    a. Stress reaction 08/2011 in multiple social stressors.  . Diabetes mellitus   . Dysrhythmia   . Gastritis   . GERD (gastroesophageal reflux disease)   . Heart murmur   . Hemorrhoid   . Hyperlipidemia   . Hypertension   . Hyperthyroidism   . Insomnia   . Morbid obesity ([place])   . OSA (obstructive sleep apnea) 01/17/2012  . Papillary thyroid carcinoma ([place])   . PCOS (polycystic ovarian syndrome)   . Sleep apnea    C PAP  . Vertigo    Past Surgical History:  Procedure Laterality Date  . CHOLECYSTECTOMY    . HEMORRHOID SURGERY    . INTRAUTERINE DEVICE INSERTION  2011  . SHOULDER SURGERY  06/2009  . THYROIDECTOMY  04/10/2012   Procedure: THYROIDECTOMY;  Surgeon: Jerrell Belfast, MD;  Location: [facility];  Service: ENT;  Laterality: N/A;  Total Thyroidectomy  . TONSILLECTOMY AND ADENOIDECTOMY  CHILD  . TUBAL LIGATION     Social History   Socioeconomic History  . Marital status: Married    Spouse name: Not on file  . Number of children: Not on file  . Years of education: Not on file  . Highest education level: Not on file  Social Needs  . Financial resource strain: Not on file  . Food insecurity - worry: Not on file  . Food insecurity - inability: Not on file  . Transportation needs - medical: Not on file  . Transportation needs - non-medical: Not on file  Occupational History  . Not on file  Tobacco Use  . Smoking status: Former Smoker    Packs/day: 0.20    Years: 5.00    Pack years: 1.00    Last attempt to quit: 03/25/2004    Years since quitting: 13.1  . Smokeless tobacco: Never Used  Substance and Sexual Activity  . Alcohol use: No    Alcohol/week: 0.0 oz  . Drug use: No  . Sexual activity: Yes  Other Topics Concern  . Not on file  Social  History Narrative  . Not on file   Current Outpatient Medications on File Prior to Visit  Medication Sig Dispense Refill  . acetaminophen (TYLENOL) 325 MG tablet Take 1 tablet (325 mg total) by mouth every 4 (four) hours as needed (pain/fever).    Marland Kitchen albuterol (PROAIR HFA) 108 (90 BASE) MCG/ACT inhaler Inhale 2 puffs into the lungs every 4 (four) hours as needed. For wheezing 1 Inhaler 4  . B-D ULTRAFINE III SHORT PEN 31G X 8 MM MISC USE ONCE A DAY 100 each 1  .  buPROPion (WELLBUTRIN XL) 150 MG 24 hr tablet Take 1 tablet (150 mg total) by mouth daily. 30 tablet 11  . carvedilol (COREG) 3.125 MG tablet TAKE 1 TABLET BY MOUTH TWICE A DAY WITH A MEAL 60 tablet 2  . cetirizine (ZYRTEC) 10 MG tablet TAKE 1 TABLET BY MOUTH EVERY DAY 30 tablet 5  . ferrous sulfate 325 (65 FE) MG tablet Take 325 mg by mouth 2 (two) times daily.      . fluticasone (FLONASE) 50 MCG/ACT nasal spray Place 2 sprays into both nostrils daily as needed for allergies. 16 g 11  . glucose blood (ONE TOUCH ULTRA TEST) test strip Use 2x a day 100 each 2  . INVOKANA 300 MG TABS tablet Take 1 tablet (300 mg total) by mouth daily before breakfast. 30 tablet 5  . KLOR-CON M20 20 MEQ tablet TAKE 1 TABLET BY MOUTH 2 TIMES A WEEK (TAKE WITH TORSEMIDE) 30 tablet 5  . levothyroxine (SYNTHROID, LEVOTHROID) 175 MCG tablet TAKE 1 TABLET BY MOUTH EVERY DAY BEFORE BREAKFAST 90 tablet 0  . levothyroxine (SYNTHROID, LEVOTHROID) 175 MCG tablet TAKE 1 TABLET BY MOUTH EVERY DAY BEFORE BREAKFAST 30 tablet 0  . metFORMIN (GLUCOPHAGE) 1000 MG tablet TAKE 1 TABLET BY MOUTH TWICE A DAY WITH A MEAL 180 tablet 3  . pantoprazole (PROTONIX) 40 MG tablet TAKE 1 TABLET BY MOUTH EVERY DAY 30 tablet 2  . sertraline (ZOLOFT) 100 MG tablet Take 1 tablet (100 mg total) by mouth daily. 30 tablet 11  . simvastatin (ZOCOR) 20 MG tablet Take 1 tablet (20 mg total) by mouth every evening. 30 tablet 11  . sitaGLIPtin (JANUVIA) 100 MG tablet Take 1 tablet (100 mg total) by  mouth daily. 30 tablet 5  . spironolactone (ALDACTONE) 25 MG tablet TAKE 1 TABLET (25 MG TOTAL) BY MOUTH DAILY. 30 tablet 11  . torsemide (DEMADEX) 20 MG tablet Take 20 mg by mouth daily as needed (fluid).     . TOUJEO SOLOSTAR 300 UNIT/ML SOPN INJECT 90 UNITS INTO THE SKIN AT BEDTIME. 27 pen 1  . TRAVATAN Z 0.004 % SOLN ophthalmic solution Place 1 drop into both eyes at bedtime.     Marland Kitchen zolpidem (AMBIEN) 10 MG tablet TAKE 1/2 TO 1 TABLET AT BEDTIME AS NEEDED 30 tablet 3  . losartan (COZAAR) 50 MG tablet Take 1 tablet (50 mg total) by mouth daily. 90 tablet 3   No current facility-administered medications on file prior to visit.    Allergies  Allergen Reactions  . Tape Rash    Plastic tape  . Buspirone Hcl Nausea Only  . Glipizide     REACTION: sleepy, low sugar  . Ivp Dye [Iodinated Diagnostic Agents]     Shut kidneys down  . Oxycodone-Acetaminophen Itching   Family History  Problem Relation Age of Onset  . Cancer Father        colon cancer  . Arthritis Father   . Colon cancer Father   . Colon polyps Father   . Arthritis Mother   . Asthma Mother   . Hypertension Mother   . Ovarian cancer Maternal Aunt   . Diabetes Paternal Aunt   . Diabetes Paternal Uncle   . Heart disease Maternal Grandfather     PE: BP (!) 167/93 (BP Location: Left Arm, Patient Position: Sitting, Cuff Size: Small)   Pulse 67   Temp 98.2 F (36.8 C) (Oral)   Resp 16   Ht 5' 5.5" (1.664 m)   Wt 267  lb (121.1 kg)   SpO2 97%   BMI 43.76 kg/m  Wt Readings from Last 3 Encounters:  05/23/17 267 lb (121.1 kg)  01/15/17 273 lb 4 oz (123.9 kg)  08/14/16 275 lb 8 oz (125 kg)   Constitutional: overweight, in NAD Eyes: PERRLA, EOMI, no exophthalmos ENT: moist mucous membranes, no thyromegaly, no cervical lymphadenopathy Cardiovascular: RRR, + 2/6 SEM no RG Respiratory: CTA B Gastrointestinal: abdomen soft, NT, ND, BS+ Musculoskeletal: no deformities, strength intact in all 4 Skin: moist, warm, no  rashes Neurological: no tremor with outstretched hands, DTR normal in all 4  ASSESSMENT: 1. DM2, insulin-dependent, uncontrolled, without complications  2. Iatrogenic Hypothyroidism - h/o med noncompliance  3. [place] -No RAI treatment was necessary  PLAN:  1. Patient with history of uncontrolled diabetes, on basal insulin regimen and metformin, with improvement in her sugars after adding Invokana and Januvia, and last HbA1c at 7.2%. - At this visit, she is not  checking sugars at all.  Strongly advised to start.  We cannot change her regimen today without more data.  We did discuss about the possibility of switching to Januvia to Trulicity, but she would like to start checking her sugars and work on her diet and activity before adding this medication.  It - today, HbA1c is 7.7% (higher) - I suggested to:  Patient Instructions  Please continue: Toujeo 45 units x2 doses at night Metformin 1000 mg 2x a day. Invokana  300 mg in am. Januvia 100 mg in am, before breakfast.  Continue levothyroxine 175 mcg daily - please make sure to take the thyroid hormone every day, with water, at least 30 minutes before breakfast, separated by at least 4 hours from: - acid reflux medications - calcium - iron - multivitamins  Please return in 3 months with your sugar log.   - continue checking sugars at different times of the day - check 1x a day, rotating checks - advised for yearly eye exams >> she is UTD - Return to clinic in 3 mo with sugar log   2. Iatrogenic Hypothyroidism - latest thyroid labs reviewed with pt >> high, at 14 - 07/2016, as she is not completely compliant with her levothyroxine.  At last visit, she was skipping this during the weekend. Now she mentions she is taking all doses consistently - she continues on LT4 175 Mcg daily - pt feels good on this dose. - we discussed about taking the thyroid hormone every day, with water, >30 minutes before breakfast, separated  by >4 hours from acid reflux medications, calcium, iron, multivitamins. Pt. is taking it correctly. - will check thyroid tests today: TSH and fT4 - If labs are abnormal, she will need to return for repeat TFTs in 1.5 months  3. Follicular PTC - neck U/S normal in 11/2014 - No neck compression symptoms (she does have some dysphagia most likely due to congestion) or masses palpated  Component     Latest Ref Rng & Units 05/23/2017  TSH     0.35 - 4.50 uIU/mL 2.83  Hemoglobin A1C      7.7  T4,Free(Direct)     0.60 - 1.60 ng/dL 0.94  TFTs are normal.  Philemon Kingdom, MD PhD [facility] Endocrinology

## 2017-05-23 NOTE — Patient Instructions (Addendum)
Please continue: Toujeo 45 units x2 doses at night Metformin 1000 mg 2x a day. Invokana  300 mg in am. Januvia 100 mg in am, before breakfast.  Continue levothyroxine 175 mcg daily - please make sure to take the thyroid hormone every day, with water, at least 30 minutes before breakfast, separated by at least 4 hours from: - acid reflux medications - calcium - iron - multivitamins  Please return in 3 months with your sugar log.

## 2017-06-06 ENCOUNTER — Encounter: Payer: Self-pay | Admitting: Internal Medicine

## 2017-06-10 ENCOUNTER — Ambulatory Visit (INDEPENDENT_AMBULATORY_CARE_PROVIDER_SITE_OTHER): Payer: BLUE CROSS/BLUE SHIELD | Admitting: Internal Medicine

## 2017-06-10 DIAGNOSIS — E1165 Type 2 diabetes mellitus with hyperglycemia: Secondary | ICD-10-CM | POA: Diagnosis not present

## 2017-06-10 DIAGNOSIS — E1142 Type 2 diabetes mellitus with diabetic polyneuropathy: Secondary | ICD-10-CM | POA: Diagnosis not present

## 2017-06-10 DIAGNOSIS — IMO0002 Reserved for concepts with insufficient information to code with codable children: Secondary | ICD-10-CM

## 2017-06-10 NOTE — Progress Notes (Signed)
Interpretation of CGM traces download provided. Will scan report. No changes in her medications for now, but patient advised to send another download in 2-3 weeks to see if at that time we need to start Trulicity instead of Januvia.  Philemon Kingdom MD

## 2017-06-27 LAB — HM DIABETES EYE EXAM

## 2017-06-30 ENCOUNTER — Other Ambulatory Visit: Payer: Self-pay | Admitting: Family Medicine

## 2017-06-30 MED ORDER — ZOLPIDEM TARTRATE 10 MG PO TABS: ORAL_TABLET | ORAL | 3 refills | Status: DC

## 2017-06-30 NOTE — Telephone Encounter (Signed)
Will refill electronically   Please schedule f/u or PE after mid may Thanks

## 2017-06-30 NOTE — Telephone Encounter (Signed)
Last refill 01/12/17 #30/3

## 2017-06-30 NOTE — Telephone Encounter (Signed)
Copied from [place] [id number]. Topic: Quick Communication - Rx Refill/Question >> Jun 30, 2017  2:16 PM Bea Graff, NT wrote: Medication: zolpidem (AMBIEN) 10 MG tablet  Has the patient contacted their pharmacy? Yes.   (Agent: If no, request that the patient contact the pharmacy for the refill.) Preferred Pharmacy (with phone number or street name): [id number] [place], [place] - [address] [phone] (Phone) [phone] (Fax)  Agent: Please be advised that RX refills may take up to 3 business days. We ask that you follow-up with your pharmacy.

## 2017-06-30 NOTE — Telephone Encounter (Signed)
Refill of Ambien  LOV 01/15/18 with Dr. Danise Mina  PCP Dr Glori Bickers  [id number] - [place], [place] - [address]

## 2017-07-03 ENCOUNTER — Encounter: Payer: Self-pay | Admitting: *Deleted

## 2017-07-23 ENCOUNTER — Other Ambulatory Visit: Payer: Self-pay | Admitting: Internal Medicine

## 2017-07-24 ENCOUNTER — Other Ambulatory Visit: Payer: Self-pay | Admitting: *Deleted

## 2017-07-24 MED ORDER — CARVEDILOL 3.125 MG PO TABS: 3.1250 mg | ORAL_TABLET | Freq: Two times a day (BID) | ORAL | 0 refills | Status: DC

## 2017-07-31 ENCOUNTER — Telehealth: Payer: Self-pay | Admitting: Family Medicine

## 2017-07-31 DIAGNOSIS — Z0000 Encounter for general adult medical examination without abnormal findings: Secondary | ICD-10-CM

## 2017-07-31 DIAGNOSIS — E782 Mixed hyperlipidemia: Secondary | ICD-10-CM

## 2017-07-31 DIAGNOSIS — I10 Essential (primary) hypertension: Secondary | ICD-10-CM

## 2017-07-31 NOTE — Telephone Encounter (Signed)
-----   Message from Ellamae Sia sent at 07/23/2017  5:30 PM EDT ----- Regarding: Lab orders for Friday, 5.10.19 Patient is scheduled for CPX labs, please order future labs, Thanks , Karna Christmas

## 2017-08-01 ENCOUNTER — Other Ambulatory Visit (INDEPENDENT_AMBULATORY_CARE_PROVIDER_SITE_OTHER): Payer: BLUE CROSS/BLUE SHIELD

## 2017-08-01 DIAGNOSIS — I10 Essential (primary) hypertension: Secondary | ICD-10-CM

## 2017-08-01 DIAGNOSIS — E782 Mixed hyperlipidemia: Secondary | ICD-10-CM | POA: Diagnosis not present

## 2017-08-01 LAB — CBC WITH DIFFERENTIAL/PLATELET
BASOS PCT: 0.7 % (ref 0.0–3.0)
Basophils Absolute: 0.1 10*3/uL (ref 0.0–0.1)
EOS PCT: 4.2 % (ref 0.0–5.0)
Eosinophils Absolute: 0.3 10*3/uL (ref 0.0–0.7)
HCT: 42.9 % (ref 36.0–46.0)
Hemoglobin: 13.9 g/dL (ref 12.0–15.0)
LYMPHS ABS: 1.5 10*3/uL (ref 0.7–4.0)
Lymphocytes Relative: 18.5 % (ref 12.0–46.0)
MCHC: 32.5 g/dL (ref 30.0–36.0)
MCV: 84.4 fl (ref 78.0–100.0)
Monocytes Absolute: 0.5 10*3/uL (ref 0.1–1.0)
Monocytes Relative: 6 % (ref 3.0–12.0)
NEUTROS PCT: 70.6 % (ref 43.0–77.0)
Neutro Abs: 5.8 10*3/uL (ref 1.4–7.7)
Platelets: 240 10*3/uL (ref 150.0–400.0)
RBC: 5.09 Mil/uL (ref 3.87–5.11)
RDW: 15.9 % — AB (ref 11.5–15.5)
WBC: 8.2 10*3/uL (ref 4.0–10.5)

## 2017-08-01 LAB — LDL CHOLESTEROL, DIRECT: Direct LDL: 103 mg/dL

## 2017-08-01 LAB — COMPREHENSIVE METABOLIC PANEL
ALT: 13 U/L (ref 0–35)
AST: 11 U/L (ref 0–37)
Albumin: 4.2 g/dL (ref 3.5–5.2)
Alkaline Phosphatase: 77 U/L (ref 39–117)
BUN: 12 mg/dL (ref 6–23)
CO2: 32 mEq/L (ref 19–32)
Calcium: 9.5 mg/dL (ref 8.4–10.5)
Chloride: 101 mEq/L (ref 96–112)
Creatinine, Ser: 0.77 mg/dL (ref 0.40–1.20)
GFR: 83.75 mL/min (ref >60.00)
Glucose, Bld: 188 mg/dL — ABNORMAL HIGH (ref 70–99)
Potassium: 3.6 mEq/L (ref 3.5–5.1)
Sodium: 141 mEq/L (ref 135–145)
Total Bilirubin: 0.3 mg/dL (ref 0.2–1.2)
Total Protein: 7.1 g/dL (ref 6.0–8.3)

## 2017-08-01 LAB — LIPID PANEL
Cholesterol: 154 mg/dL (ref 0–200)
HDL: 32.7 mg/dL — ABNORMAL LOW (ref >39.00)
NonHDL: 121.61
Total CHOL/HDL Ratio: 5
Triglycerides: 227 mg/dL — ABNORMAL HIGH (ref 0.0–149.0)
VLDL: 45.4 mg/dL — ABNORMAL HIGH (ref 0.0–40.0)

## 2017-08-01 LAB — TSH: TSH: 6.32 u[IU]/mL — ABNORMAL HIGH (ref 0.35–4.50)

## 2017-08-08 ENCOUNTER — Encounter: Payer: Self-pay | Admitting: Family Medicine

## 2017-08-08 ENCOUNTER — Ambulatory Visit (INDEPENDENT_AMBULATORY_CARE_PROVIDER_SITE_OTHER): Payer: BLUE CROSS/BLUE SHIELD | Admitting: Family Medicine

## 2017-08-08 VITALS — BP 140/80 | HR 64 | Temp 98.1°F | Ht 65.0 in | Wt 264.5 lb

## 2017-08-08 DIAGNOSIS — I10 Essential (primary) hypertension: Secondary | ICD-10-CM

## 2017-08-08 DIAGNOSIS — I5032 Chronic diastolic (congestive) heart failure: Secondary | ICD-10-CM | POA: Diagnosis not present

## 2017-08-08 DIAGNOSIS — F418 Other specified anxiety disorders: Secondary | ICD-10-CM | POA: Diagnosis not present

## 2017-08-08 DIAGNOSIS — E249 Cushing's syndrome, unspecified: Secondary | ICD-10-CM

## 2017-08-08 DIAGNOSIS — Z0000 Encounter for general adult medical examination without abnormal findings: Secondary | ICD-10-CM

## 2017-08-08 DIAGNOSIS — Z23 Encounter for immunization: Secondary | ICD-10-CM

## 2017-08-08 DIAGNOSIS — E782 Mixed hyperlipidemia: Secondary | ICD-10-CM

## 2017-08-08 MED ORDER — SERTRALINE HCL 100 MG PO TABS: 150.0000 mg | ORAL_TABLET | Freq: Every day | ORAL | 11 refills | Status: DC

## 2017-08-08 MED ORDER — PANTOPRAZOLE SODIUM 40 MG PO TBEC: 40.0000 mg | DELAYED_RELEASE_TABLET | Freq: Every day | ORAL | 11 refills | Status: DC

## 2017-08-08 MED ORDER — CARVEDILOL 3.125 MG PO TABS
3.1250 mg | ORAL_TABLET | Freq: Two times a day (BID) | ORAL | 11 refills | Status: DC
Start: 2017-08-08 — End: 2018-06-26

## 2017-08-08 MED ORDER — SIMVASTATIN 20 MG PO TABS: 20.0000 mg | ORAL_TABLET | Freq: Every evening | ORAL | 11 refills | Status: DC

## 2017-08-08 MED ORDER — BUPROPION HCL ER (XL) 150 MG PO TB24: 150.0000 mg | ORAL_TABLET | Freq: Every day | ORAL | 11 refills | Status: DC

## 2017-08-08 NOTE — Progress Notes (Signed)
Subjective:    Patient ID: [name], female    DOB: 06-Apr-1965, 52 y.o.   MRN: [id number]  HPI  Here for health maintenance exam and to review chronic medical problems    A lot of complaints lately- muscle/joint issues Hurts her body to put clothes on  Tired  Pins/needles  Poor concentration and anx -hard do focus  Balance is off   On zoloft and wellbutrin  Anxiety is worse than depression  Open to inc her zoloft   A lot of very stressful family issues Care giving  Has a friend she talks to  She is not in counseling and does not want to do it    Wt Readings from Last 3 Encounters:  08/08/17 264 lb 8 oz (120 kg)  05/23/17 267 lb (121.1 kg)  01/15/17 273 lb 4 oz (123.9 kg)  less appetite due to anxiety  44.02 kg/m   HIV screening -declines due to low risk   Pneumonia vaccine last 11/13   Mammogram 2/18 - is late getting it / will get at gyn office  Self breast exam - no lumps   Flu shot 10.18  Eye exam 4/19 Watching mild glaucoma   Pap 2/18 gyn-utd   Colonoscopy 6/17 5 y recall   Tetanus shot 12/13   Blood pressure- up on first check  Better on 2nd 140/80  BP Readings from Last 3 Encounters:  08/08/17 (!) 148/82  05/23/17 (!) 167/93  01/15/17 120/68  taking coreg  Losartan Spironolactone   CHF - is controlled  occ palpitations with anxiety    Thyroid Lab Results  Component Value Date   TSH 6.32 (H) 08/01/2017  has not changed doses Sees endocrinology  -monitoring this    Using a continuous glucose monitor - likes it  DM Sees Dr Cruzita Lederer  Lab Results  Component Value Date   HGBA1C 7.7 05/23/2017  not bad for her    H/o anemia Cbc is ok  Lab Results  Component Value Date   WBC 8.2 08/01/2017   HGB 13.9 08/01/2017   HCT 42.9 08/01/2017   MCV 84.4 08/01/2017   PLT 240.0 08/01/2017  taking iron   Hyperlipidemia Lab Results  Component Value Date   CHOL 154 08/01/2017   CHOL 135 07/26/2016   CHOL 147 06/16/2015   Lab  Results  Component Value Date   HDL 32.70 (L) 08/01/2017   HDL 33.40 (L) 07/26/2016   HDL 41.50 06/16/2015   Lab Results  Component Value Date   LDLCALC 71 06/16/2015   LDLCALC 91 12/20/2013   LDLCALC 78 01/17/2012   Lab Results  Component Value Date   TRIG 227.0 (H) 08/01/2017   TRIG 265.0 (H) 07/26/2016   TRIG 177.0 (H) 06/16/2015   Lab Results  Component Value Date   CHOLHDL 5 08/01/2017   CHOLHDL 4 07/26/2016   CHOLHDL 4 06/16/2015   Lab Results  Component Value Date   LDLDIRECT 103.0 08/01/2017   LDLDIRECT 81.0 07/26/2016   LDLDIRECT 100.0 02/04/2011   zocor and diet  Has missed doses of zocor  Does make an effort to eat healthy     Patient Active Problem List   Diagnosis Date Noted  . Routine general medical examination at a health care facility 12/19/2013  . Palpitations 02/03/2013  . Nonspecific abnormal toxicology 10/29/2012  . 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 Papillary thyroid carcinoma ([place]) 04/27/2012  . Cushing's syndrome ([place]) 04/27/2012  . Chronic diastolic heart failure ([place]) 02/10/2012  .  Diastolic CHF ([place]) 81/44/8185  . OSA (obstructive sleep apnea) just got cpap at home this past week 01/17/12 01/17/2012  . CHF (congestive heart failure) ([place]) 01/16/2012  . Chronic allergic rhinitis 10/26/2010  . Insomnia 10/26/2010  . OBESITY, MORBID 04/27/2010  . HEMORRHOIDS, INTERNAL, WITH BLEEDING 08/17/2009  . GERD 04/27/2008  . Essential hypertension 11/03/2007  . HYPERLIPIDEMIA 05/14/2007  . OTHER ORGANIC SLEEP DISORDERS 03/09/2007  . Uncontrolled type 2 diabetes mellitus with peripheral neuropathy ([place]) 09/11/2006  . POLYCYSTIC OVARIES 09/11/2006  . Iron deficiency anemia 09/11/2006  . Depression with anxiety 09/11/2006  . EXTERNAL HEMORRHOIDS 09/11/2006  . Asthma 09/11/2006   Past Medical History:  Diagnosis Date  . Allergy    SEASONAL  . Anemia    iron deficiency  . Anxiety   . Arthritis   . Asthma    a. PFTs showing  possible mild AFL 11/2010 but most likely no evidence of asthma.  . Cellulitis and abscess of leg 02/2014  . CHF (congestive heart failure) ([place])   . Chronic kidney disease    /13  . Complication of anesthesia    DIFFICULT WAKING- 2011  . Depression    a. Stress reaction 08/2011 in multiple social stressors.  . Diabetes mellitus   . Dysrhythmia   . Gastritis   . GERD (gastroesophageal reflux disease)   . Heart murmur   . Hemorrhoid   . Hyperlipidemia   . Hypertension   . Hyperthyroidism   . Insomnia   . Morbid obesity ([place])   . OSA (obstructive sleep apnea) 01/17/2012  . Papillary thyroid carcinoma ([place])   . PCOS (polycystic ovarian syndrome)   . Sleep apnea    C PAP  . Vertigo    Past Surgical History:  Procedure Laterality Date  . CHOLECYSTECTOMY    . HEMORRHOID SURGERY    . INTRAUTERINE DEVICE INSERTION  2011  . SHOULDER SURGERY  06/2009  . THYROIDECTOMY  04/10/2012   Procedure: THYROIDECTOMY;  Surgeon: Jerrell Belfast, MD;  Location: [facility];  Service: ENT;  Laterality: N/A;  Total Thyroidectomy  . TONSILLECTOMY AND ADENOIDECTOMY  CHILD  . TUBAL LIGATION     Social History   Tobacco Use  . Smoking status: Former Smoker    Packs/day: 0.20    Years: 5.00    Pack years: 1.00    Last attempt to quit: 03/25/2004    Years since quitting: 13.3  . Smokeless tobacco: Never Used  Substance Use Topics  . Alcohol use: No    Alcohol/week: 0.0 oz  . Drug use: No   Family History  Problem Relation Age of Onset  . Cancer Father        colon cancer  . Arthritis Father   . Colon cancer Father   . Colon polyps Father   . Arthritis Mother   . Asthma Mother   . Hypertension Mother   . Ovarian cancer Maternal Aunt   . Diabetes Paternal Aunt   . Diabetes Paternal Uncle   . Heart disease Maternal Grandfather    Allergies  Allergen Reactions  . Tape Rash    Plastic tape  . Buspirone Hcl Nausea Only  . Glipizide     REACTION: sleepy, low sugar  . Ivp Dye [Iodinated  Diagnostic Agents]     Shut kidneys down  . Oxycodone-Acetaminophen Itching   Current Outpatient Medications on File Prior to Visit  Medication Sig Dispense Refill  . acetaminophen (TYLENOL) 325 MG tablet Take 1 tablet (325 mg total) by  mouth every 4 (four) hours as needed (pain/fever).    Marland Kitchen albuterol (PROAIR HFA) 108 (90 BASE) MCG/ACT inhaler Inhale 2 puffs into the lungs every 4 (four) hours as needed. For wheezing 1 Inhaler 4  . B-D ULTRAFINE III SHORT PEN 31G X 8 MM MISC USE ONCE A DAY 100 each 1  . cetirizine (ZYRTEC) 10 MG tablet TAKE 1 TABLET BY MOUTH EVERY DAY 30 tablet 5  . Continuous Blood Gluc Sensor (FREESTYLE LIBRE 14 DAY SENSOR) MISC 1 each by Does not apply route every 14 (fourteen) days. Change every 2 weeks 2 each 11  . ferrous sulfate 325 (65 FE) MG tablet Take 325 mg by mouth 2 (two) times daily.      . fluticasone (FLONASE) 50 MCG/ACT nasal spray Place 2 sprays into both nostrils daily as needed for allergies. 16 g 11  . glucose blood (ONE TOUCH ULTRA TEST) test strip Use 2x a day 100 each 2  . INVOKANA 300 MG TABS tablet Take 1 tablet (300 mg total) by mouth daily before breakfast. 30 tablet 5  . KLOR-CON M20 20 MEQ tablet TAKE 1 TABLET BY MOUTH 2 TIMES A WEEK (TAKE WITH TORSEMIDE) 30 tablet 5  . levothyroxine (SYNTHROID, LEVOTHROID) 175 MCG tablet TAKE 1 TABLET BY MOUTH EVERY DAY BEFORE BREAKFAST 90 tablet 0  . metFORMIN (GLUCOPHAGE) 1000 MG tablet TAKE 1 TABLET BY MOUTH TWICE A DAY WITH A MEAL 180 tablet 3  . sitaGLIPtin (JANUVIA) 100 MG tablet Take 1 tablet (100 mg total) by mouth daily. 30 tablet 5  . spironolactone (ALDACTONE) 25 MG tablet TAKE 1 TABLET (25 MG TOTAL) BY MOUTH DAILY. 30 tablet 11  . torsemide (DEMADEX) 20 MG tablet Take 20 mg by mouth daily as needed (fluid).     Nelva Nay SOLOSTAR 300 UNIT/ML SOPN INJECT 90 UNITS INTO THE SKIN AT BEDTIME (6 BOXES) 27 pen 1  . TRAVATAN Z 0.004 % SOLN ophthalmic solution Place 1 drop into both eyes at bedtime.     Marland Kitchen  zolpidem (AMBIEN) 10 MG tablet TAKE 1/2 TO 1 TABLET AT BEDTIME AS NEEDED 30 tablet 3  . losartan (COZAAR) 50 MG tablet Take 1 tablet (50 mg total) by mouth daily. 90 tablet 3   No current facility-administered medications on file prior to visit.     Review of Systems  Constitutional: Positive for fatigue. Negative for activity change, appetite change, fever and unexpected weight change.  HENT: Negative for congestion, ear pain, rhinorrhea, sinus pressure and sore throat.   Eyes: Negative for pain, redness and visual disturbance.  Respiratory: Negative for cough, shortness of breath and wheezing.   Cardiovascular: Negative for chest pain and palpitations.  Gastrointestinal: Negative for abdominal pain, blood in stool, constipation and diarrhea.  Endocrine: Negative for polydipsia and polyuria.  Genitourinary: Negative for dysuria, frequency and urgency.  Musculoskeletal: Positive for arthralgias, back pain and myalgias.  Skin: Negative for pallor and rash.  Allergic/Immunologic: Negative for environmental allergies.  Neurological: Negative for dizziness, syncope and headaches.  Hematological: Negative for adenopathy. Does not bruise/bleed easily.  Psychiatric/Behavioral: Positive for decreased concentration and dysphoric mood. Negative for self-injury and suicidal ideas. The patient is nervous/anxious.        Objective:   Physical Exam  Constitutional: She appears well-developed and well-nourished. No distress.  obese and well appearing   HENT:  Head: Normocephalic and atraumatic.  Right Ear: External ear normal.  Left Ear: External ear normal.  Nose: Nose normal.  Mouth/Throat: Oropharynx is clear  and moist.  Moon face from [place]  Eyes: Pupils are equal, round, and reactive to light. Conjunctivae and EOM are normal. Right eye exhibits no discharge. Left eye exhibits no discharge. No scleral icterus.  Neck: Normal range of motion. Neck supple. No JVD present. Carotid bruit is not  present. No thyromegaly present.  Cardiovascular: Normal rate, regular rhythm, normal heart sounds and intact distal pulses. Exam reveals no gallop.  Pulmonary/Chest: Effort normal and breath sounds normal. No respiratory distress. She has no wheezes. She has no rales.  Abdominal: Soft. Bowel sounds are normal. She exhibits no distension and no mass. There is no tenderness.  Musculoskeletal: She exhibits no edema or tenderness.  Lymphadenopathy:    She has no cervical adenopathy.  Neurological: She is alert. She has normal reflexes. No cranial nerve deficit. She exhibits normal muscle tone. Coordination normal.  Skin: Skin is warm and dry. No rash noted. No erythema. No pallor.  Solar lentigines diffusely   Psychiatric: Her speech is normal and behavior is normal. Thought content normal. Her mood appears anxious. Her affect is not blunt, not labile and not inappropriate. She exhibits a depressed mood.          Assessment & Plan:   Problem List Items Addressed This Visit      Cardiovascular and Mediastinum   Chronic diastolic heart failure (HCC)    No clinical changes       Relevant Medications   carvedilol (COREG) 3.125 MG tablet   simvastatin (ZOCOR) 20 MG tablet   Essential hypertension    bp in fair control at this time  BP Readings from Last 1 Encounters:  08/08/17 140/80   No changes needed Most recent labs reviewed  Disc lifstyle change with low sodium diet and exercise        Relevant Medications   carvedilol (COREG) 3.125 MG tablet   simvastatin (ZOCOR) 20 MG tablet     Endocrine   Cushing's syndrome (HCC)    Followed by endocrinology        Other   Depression with anxiety    Worse lately-poss making physical symptoms worse  Will inc zoloft dose to 150 mg  At f/u consider inc wellbutrin Declines counseling  Reviewed stressors/ coping techniques/symptoms/ support sources/ tx options and side effects in detail today  A lot of family stressors currently         Relevant Medications   sertraline (ZOLOFT) 100 MG tablet   buPROPion (WELLBUTRIN XL) 150 MG 24 hr tablet   HYPERLIPIDEMIA    Disc goals for lipids and reasons to control them Rev last labs with pt Rev low sat fat diet in detail  Continue simvastatin  Stressed imp of compliance-has missed doses        Relevant Medications   carvedilol (COREG) 3.125 MG tablet   simvastatin (ZOCOR) 20 MG tablet   OBESITY, MORBID    Discussed how this problem influences overall health and the risks it imposes  Reviewed plan for weight loss with lower calorie diet (via better food choices and also portion control or program like weight watchers) and exercise building up to or more than 30 minutes 5 days per week including some aerobic activity         Routine general medical examination at a health care facility - Primary    Reviewed health habits including diet and exercise and skin cancer prevention Reviewed appropriate screening tests for age  Also reviewed health mt list, fam hx and immunization status ,  as well as social and family history   See HPI Labs reviewed  Enc wt loss  Enc exercise for mood (dep and anx) Pneumovax today  F/u for new symptoms        Other Visit Diagnoses    Need for 23-polyvalent pneumococcal polysaccharide vaccine       Relevant Orders   Pneumococcal polysaccharide vaccine 23-valent greater than or equal to 2yo subcutaneous/IM (Completed)

## 2017-08-08 NOTE — Patient Instructions (Addendum)
Don't forget to schedule your mammogram with the gyn office   Pneumovax today   We will increase your zoloft to 150 mg for anxiety  Follow up in 2-3 weeks  If any problems with this let me know We can discuss increasing the wellbutrin at follow up  We will also re check blood pressure   Try to get more exercise for mood and sense of well being

## 2017-08-10 NOTE — Assessment & Plan Note (Signed)
Discussed how this problem influences overall health and the risks it imposes  Reviewed plan for weight loss with lower calorie diet (via better food choices and also portion control or program like weight watchers) and exercise building up to or more than 30 minutes 5 days per week including some aerobic activity    

## 2017-08-10 NOTE — Assessment & Plan Note (Signed)
Followed by endocrinology 

## 2017-08-10 NOTE — Assessment & Plan Note (Signed)
Disc goals for lipids and reasons to control them Rev last labs with pt Rev low sat fat diet in detail  Continue simvastatin  Stressed imp of compliance-has missed doses

## 2017-08-10 NOTE — Assessment & Plan Note (Signed)
No clinical changes 

## 2017-08-10 NOTE — Assessment & Plan Note (Signed)
bp in fair control at this time  BP Readings from Last 1 Encounters:  08/08/17 140/80   No changes needed Most recent labs reviewed  Disc lifstyle change with low sodium diet and exercise

## 2017-08-10 NOTE — Assessment & Plan Note (Signed)
Reviewed health habits including diet and exercise and skin cancer prevention Reviewed appropriate screening tests for age  Also reviewed health mt list, fam hx and immunization status , as well as social and family history   See HPI Labs reviewed  Enc wt loss  Enc exercise for mood (dep and anx) Pneumovax today  F/u for new symptoms

## 2017-08-10 NOTE — Assessment & Plan Note (Signed)
Worse lately-poss making physical symptoms worse  Will inc zoloft dose to 150 mg  At f/u consider inc wellbutrin Declines counseling  Reviewed stressors/ coping techniques/symptoms/ support sources/ tx options and side effects in detail today  A lot of family stressors currently

## 2017-08-22 ENCOUNTER — Ambulatory Visit: Payer: BLUE CROSS/BLUE SHIELD | Admitting: Family Medicine

## 2017-08-22 ENCOUNTER — Encounter: Payer: Self-pay | Admitting: Family Medicine

## 2017-08-22 VITALS — BP 144/82 | HR 61 | Temp 98.3°F | Ht 65.0 in | Wt 270.0 lb

## 2017-08-22 DIAGNOSIS — F418 Other specified anxiety disorders: Secondary | ICD-10-CM

## 2017-08-22 MED ORDER — BUPROPION HCL ER (XL) 300 MG PO TB24: 300.0000 mg | ORAL_TABLET | Freq: Every day | ORAL | 3 refills | Status: DC

## 2017-08-22 NOTE — Progress Notes (Signed)
Subjective:    Patient ID: [name], female    DOB: 1965-09-19, 52 y.o.   MRN: [id number]  HPI  Here for f/u of chronic health problems  Wt Readings from Last 3 Encounters:  08/22/17 270 lb (122.5 kg)  08/08/17 264 lb 8 oz (120 kg)  05/23/17 267 lb (121.1 kg)   44.93 kg/m  Not much appetite    Last visit we inc her zoloft to 150 mg daily  Anxiety was much worse at that time with family stressors  Cannot tell if it has not made much of a difference yet  Concentration is still terrible   The anxiety- during stress - is still problematic but not anxious all the time   Stress is the same/ no changes  No light at the end of the tunnel  Family problems (not getting any help)   Son had another dui - will likely have to do time  Stress reduction- has a hobby that helps- but that takes time away from her family   Not seeing a counselor  May be open to it later    Patient Active Problem List   Diagnosis Date Noted  . Routine general medical examination at a health care facility 12/19/2013  . Palpitations 02/03/2013  . Nonspecific abnormal toxicology 10/29/2012  . 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 Papillary thyroid carcinoma ([place]) 04/27/2012  . Cushing's syndrome ([place]) 04/27/2012  . Chronic diastolic heart failure ([place]) 02/10/2012  . Diastolic CHF ([place]) 05/39/7673  . OSA (obstructive sleep apnea) just got cpap at home this past week 01/17/12 01/17/2012  . CHF (congestive heart failure) ([place]) 01/16/2012  . Chronic allergic rhinitis 10/26/2010  . Insomnia 10/26/2010  . OBESITY, MORBID 04/27/2010  . HEMORRHOIDS, INTERNAL, WITH BLEEDING 08/17/2009  . GERD 04/27/2008  . Essential hypertension 11/03/2007  . HYPERLIPIDEMIA 05/14/2007  . OTHER ORGANIC SLEEP DISORDERS 03/09/2007  . Uncontrolled type 2 diabetes mellitus with peripheral neuropathy ([place]) 09/11/2006  . POLYCYSTIC OVARIES 09/11/2006  . Iron deficiency anemia 09/11/2006  . Depression with anxiety  09/11/2006  . EXTERNAL HEMORRHOIDS 09/11/2006  . Asthma 09/11/2006   Past Medical History:  Diagnosis Date  . Allergy    SEASONAL  . Anemia    iron deficiency  . Anxiety   . Arthritis   . Asthma    a. 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  . Cellulitis and abscess of leg 02/2014  . CHF (congestive heart failure) ([place])   . Chronic kidney disease    /13  . Complication of anesthesia    DIFFICULT WAKING- 2011  . Depression    a. Stress reaction 08/2011 in multiple social stressors.  . Diabetes mellitus   . Dysrhythmia   . Gastritis   . GERD (gastroesophageal reflux disease)   . Heart murmur   . Hemorrhoid   . Hyperlipidemia   . Hypertension   . Hyperthyroidism   . Insomnia   . Morbid obesity ([place])   . OSA (obstructive sleep apnea) 01/17/2012  . Papillary thyroid carcinoma ([place])   . PCOS (polycystic ovarian syndrome)   . Sleep apnea    C PAP  . Vertigo    Past Surgical History:  Procedure Laterality Date  . CHOLECYSTECTOMY    . HEMORRHOID SURGERY    . INTRAUTERINE DEVICE INSERTION  2011  . SHOULDER SURGERY  06/2009  . THYROIDECTOMY  04/10/2012   Procedure: THYROIDECTOMY;  Surgeon: Jerrell Belfast, MD;  Location: [facility];  Service: ENT;  Laterality: N/A;  Total Thyroidectomy  . TONSILLECTOMY AND ADENOIDECTOMY  CHILD  . TUBAL LIGATION     Social History   Tobacco Use  . Smoking status: Former Smoker    Packs/day: 0.20    Years: 5.00    Pack years: 1.00    Last attempt to quit: 03/25/2004    Years since quitting: 13.4  . Smokeless tobacco: Never Used  Substance Use Topics  . Alcohol use: No    Alcohol/week: 0.0 oz  . Drug use: No   Family History  Problem Relation Age of Onset  . Cancer Father        colon cancer  . Arthritis Father   . Colon cancer Father   . Colon polyps Father   . Arthritis Mother   . Asthma Mother   . Hypertension Mother   . Ovarian cancer Maternal Aunt   . Diabetes Paternal Aunt   . Diabetes  Paternal Uncle   . Heart disease Maternal Grandfather    Allergies  Allergen Reactions  . Tape Rash    Plastic tape  . Buspirone Hcl Nausea Only  . Glipizide     REACTION: sleepy, low sugar  . Ivp Dye [Iodinated Diagnostic Agents]     Shut kidneys down  . Oxycodone-Acetaminophen Itching   Current Outpatient Medications on File Prior to Visit  Medication Sig Dispense Refill  . acetaminophen (TYLENOL) 325 MG tablet Take 1 tablet (325 mg total) by mouth every 4 (four) hours as needed (pain/fever).    Marland Kitchen albuterol (PROAIR HFA) 108 (90 BASE) MCG/ACT inhaler Inhale 2 puffs into the lungs every 4 (four) hours as needed. For wheezing 1 Inhaler 4  . B-D ULTRAFINE III SHORT PEN 31G X 8 MM MISC USE ONCE A DAY 100 each 1  . carvedilol (COREG) 3.125 MG tablet Take 1 tablet (3.125 mg total) by mouth 2 (two) times daily with a meal. 60 tablet 11  . cetirizine (ZYRTEC) 10 MG tablet TAKE 1 TABLET BY MOUTH EVERY DAY 30 tablet 5  . Continuous Blood Gluc Sensor (FREESTYLE LIBRE 14 DAY SENSOR) MISC 1 each by Does not apply route every 14 (fourteen) days. Change every 2 weeks 2 each 11  . ferrous sulfate 325 (65 FE) MG tablet Take 325 mg by mouth 2 (two) times daily.      . fluticasone (FLONASE) 50 MCG/ACT nasal spray Place 2 sprays into both nostrils daily as needed for allergies. 16 g 11  . glucose blood (ONE TOUCH ULTRA TEST) test strip Use 2x a day 100 each 2  . INVOKANA 300 MG TABS tablet Take 1 tablet (300 mg total) by mouth daily before breakfast. 30 tablet 5  . KLOR-CON M20 20 MEQ tablet TAKE 1 TABLET BY MOUTH 2 TIMES A WEEK (TAKE WITH TORSEMIDE) 30 tablet 5  . levothyroxine (SYNTHROID, LEVOTHROID) 175 MCG tablet TAKE 1 TABLET BY MOUTH EVERY DAY BEFORE BREAKFAST 90 tablet 0  . metFORMIN (GLUCOPHAGE) 1000 MG tablet TAKE 1 TABLET BY MOUTH TWICE A DAY WITH A MEAL 180 tablet 3  . pantoprazole (PROTONIX) 40 MG tablet Take 1 tablet (40 mg total) by mouth daily. 30 tablet 11  . sertraline (ZOLOFT) 100 MG  tablet Take 1.5 tablets (150 mg total) by mouth daily. 30 tablet 11  . simvastatin (ZOCOR) 20 MG tablet Take 1 tablet (20 mg total) by mouth every evening. 30 tablet 11  . sitaGLIPtin (JANUVIA) 100 MG tablet Take 1 tablet (100 mg total) by mouth  daily. 30 tablet 5  . spironolactone (ALDACTONE) 25 MG tablet TAKE 1 TABLET (25 MG TOTAL) BY MOUTH DAILY. 30 tablet 11  . torsemide (DEMADEX) 20 MG tablet Take 20 mg by mouth daily as needed (fluid).     Nelva Nay SOLOSTAR 300 UNIT/ML SOPN INJECT 90 UNITS INTO THE SKIN AT BEDTIME (6 BOXES) 27 pen 1  . TRAVATAN Z 0.004 % SOLN ophthalmic solution Place 1 drop into both eyes at bedtime.     Marland Kitchen zolpidem (AMBIEN) 10 MG tablet TAKE 1/2 TO 1 TABLET AT BEDTIME AS NEEDED 30 tablet 3  . losartan (COZAAR) 50 MG tablet Take 1 tablet (50 mg total) by mouth daily. 90 tablet 3   No current facility-administered medications on file prior to visit.     Review of Systems  Constitutional: Positive for fatigue. Negative for activity change, appetite change, fever and unexpected weight change.  HENT: Negative for congestion, rhinorrhea, sore throat and trouble swallowing.   Eyes: Negative for pain, redness, itching and visual disturbance.  Respiratory: Negative for cough, chest tightness, shortness of breath and wheezing.   Cardiovascular: Negative for chest pain and palpitations.  Gastrointestinal: Negative for abdominal pain, blood in stool, constipation, diarrhea and nausea.  Endocrine: Negative for cold intolerance, heat intolerance, polydipsia and polyuria.  Genitourinary: Negative for difficulty urinating, dysuria, frequency and urgency.  Musculoskeletal: Negative for arthralgias, joint swelling and myalgias.  Skin: Negative for pallor and rash.  Neurological: Negative for dizziness, tremors, weakness, numbness and headaches.  Hematological: Negative for adenopathy. Does not bruise/bleed easily.  Psychiatric/Behavioral: Positive for dysphoric mood and sleep  disturbance. Negative for decreased concentration, self-injury and suicidal ideas. The patient is nervous/anxious.        Stressors       Objective:   Physical Exam  Constitutional: She appears well-developed and well-nourished. No distress.  obese and well appearing Less distressed today  HENT:  Head: Normocephalic and atraumatic.  Mouth/Throat: Oropharynx is clear and moist.  Eyes: Pupils are equal, round, and reactive to light. Conjunctivae and EOM are normal.  Neck: Normal range of motion. Neck supple. No thyromegaly present.  Cardiovascular: Normal rate, regular rhythm and normal heart sounds.  Pulmonary/Chest: Effort normal and breath sounds normal.  Neurological: She is alert. She displays normal reflexes. No cranial nerve deficit. She exhibits normal muscle tone. Coordination normal.  No tremor   Skin: Skin is warm and dry. No rash noted. She is not diaphoretic. No erythema. No pallor.  Psychiatric: Her speech is normal and behavior is normal. Thought content normal. Her mood appears anxious. Her affect is not blunt, not labile and not inappropriate. She is not actively hallucinating. Cognition and memory are normal. She exhibits a depressed mood.  Candidly discusses family issues   Not as anxious as last visit  Less tearful as well She is attentive.          Assessment & Plan:   Problem List Items Addressed This Visit      Other   Depression with anxiety - Primary    Some improvement of anx symptoms- still down/depressed  Reviewed stressors/ coping techniques/symptoms/ support sources/ tx options and side effects in detail today Social /family situation is not good -but she is not in danger Continue zoloft at 150 mg  Inc wellbutrin to 300 xl daily  Disc self care  >25 minutes spent in face to face time with patient, >50% spent in counselling or coordination of care-incl disc of poss med side effects and also  imp of self care and need to set limits with family    Recommend counseling when affordable- may pursue pastoral counseling if available to her      Relevant Medications   buPROPion (WELLBUTRIN XL) 300 MG 24 hr tablet

## 2017-08-22 NOTE — Patient Instructions (Addendum)
Continue the zoloft at 150 mg   Increase wellbutrin xl from 150 to 300 mg once daily  Pills left- take 2 pills once daily  New px- is just 1 daily (it is the 300)   If any problems or side effects let me know   Do more of the things you enjoy   Seek out some pastoral counseling

## 2017-08-23 ENCOUNTER — Other Ambulatory Visit: Payer: Self-pay | Admitting: Internal Medicine

## 2017-08-24 ENCOUNTER — Encounter: Payer: Self-pay | Admitting: Family Medicine

## 2017-08-24 NOTE — Assessment & Plan Note (Signed)
Some improvement of anx symptoms- still down/depressed  Reviewed stressors/ coping techniques/symptoms/ support sources/ tx options and side effects in detail today Social /family situation is not good -but she is not in danger Continue zoloft at 150 mg  Inc wellbutrin to 300 xl daily  Disc self care  >25 minutes spent in face to face time with patient, >50% spent in counselling or coordination of care-incl disc of poss med side effects and also imp of self care and need to set limits with family  Recommend counseling when affordable- may pursue pastoral counseling if available to her

## 2017-08-29 ENCOUNTER — Ambulatory Visit: Payer: BLUE CROSS/BLUE SHIELD | Admitting: Internal Medicine

## 2017-09-05 ENCOUNTER — Other Ambulatory Visit: Payer: Self-pay | Admitting: Internal Medicine

## 2017-09-05 ENCOUNTER — Other Ambulatory Visit (HOSPITAL_COMMUNITY): Payer: Self-pay | Admitting: Internal Medicine

## 2017-09-05 MED ORDER — LOSARTAN POTASSIUM 50 MG PO TABS: 50.0000 mg | ORAL_TABLET | Freq: Every day | ORAL | 0 refills | Status: DC

## 2017-09-12 ENCOUNTER — Encounter: Payer: Self-pay | Admitting: Internal Medicine

## 2017-09-12 ENCOUNTER — Ambulatory Visit: Payer: BLUE CROSS/BLUE SHIELD | Admitting: Internal Medicine

## 2017-09-12 VITALS — BP 140/86 | HR 63 | Ht 65.0 in | Wt 263.8 lb

## 2017-09-12 DIAGNOSIS — E1165 Type 2 diabetes mellitus with hyperglycemia: Secondary | ICD-10-CM | POA: Diagnosis not present

## 2017-09-12 DIAGNOSIS — R1310 Dysphagia, unspecified: Secondary | ICD-10-CM

## 2017-09-12 DIAGNOSIS — E1142 Type 2 diabetes mellitus with diabetic polyneuropathy: Secondary | ICD-10-CM

## 2017-09-12 DIAGNOSIS — E890 Postprocedural hypothyroidism: Secondary | ICD-10-CM | POA: Diagnosis not present

## 2017-09-12 DIAGNOSIS — IMO0002 Reserved for concepts with insufficient information to code with codable children: Secondary | ICD-10-CM

## 2017-09-12 DIAGNOSIS — C73 Malignant neoplasm of thyroid gland: Secondary | ICD-10-CM | POA: Diagnosis not present

## 2017-09-12 LAB — POCT GLYCOSYLATED HEMOGLOBIN (HGB A1C): HEMOGLOBIN A1C: 7.5 % — AB (ref 4.0–5.6)

## 2017-09-12 MED ORDER — LEVOTHYROXINE SODIUM 175 MCG PO TABS
ORAL_TABLET | ORAL | 3 refills | Status: DC
Start: 2017-09-12 — End: 2018-09-29

## 2017-09-12 MED ORDER — INSULIN PEN NEEDLE 31G X 8 MM MISC: 3 refills | Status: DC

## 2017-09-12 MED ORDER — SEMAGLUTIDE(0.25 OR 0.5MG/DOS) 2 MG/1.5ML ~~LOC~~ SOPN: 0.5000 mg | PEN_INJECTOR | SUBCUTANEOUS | 5 refills | Status: DC

## 2017-09-12 MED ORDER — INVOKANA 300 MG PO TABS: 300.0000 mg | ORAL_TABLET | Freq: Every day | ORAL | 11 refills | Status: DC

## 2017-09-12 NOTE — Progress Notes (Signed)
Patient ID: [name], female   DOB: [birth year], 52 y.o.   MRN: [id number]  HPI: [name] is a 52 y.o.-year-old female, returning for f/u for DM2, dx 2008, insulin-dependent since ~2011, uncontrolled, with complications (peripheral neuropathy, dCHF), h/o papillary thyroid cancer and iatrogenic hypothyroidism. Last visit 3.5 months ago.  DM2: Last hemoglobin A1c was: Lab Results  Component Value Date   HGBA1C 7.7 05/23/2017   HGBA1C 7.4 07/01/2016   HGBA1C 7.2 (H) 06/16/2015    She is on: - Lantus solostar >> Toujeo 80 >> 45 units qhs (2 inj's) - Metformin 1000 mg 2x a day - Invokana 300 mg in a.m. - Januvia 100 mg in a.m. She was also on Glipizide but developed hypoglycemia >> loss of consciousness. She was also on Amaryl >> taken off.   Pt  was not checking sugars at last visit.  Since last visit, we started the Freestyle libre CGM - reviewed downloads: - am: 123-160 >> 170-220 >> 140-160 >> 132-181 (some checks are after soda!) - before lunch: 116-163 >> n/c >> 115 - before dinner: 113 >> n/c >> 106, 156 - bedtime: 155-178, 211 >> n/c >> 160s >> 153, 159 Lowest sugar was 170 >> 120s >> ? Highest sugar was 275 >> 190 >> ?  Freestyle Libre CGM 14 day parameters: - average: 132 +/- 21.7% - coefficient of variation: 16.4% (19-25%) - time in range:  - low (<70): 0% - normal (70-180): 97% - high (>180): 3%  Pt's meals are: - Breakfast: skips - 10 o'clock: banana, apple, nectarine - Lunch: fast food - Dinner: goes out to eat around 8 o'clock: chicken sandwich or a wrap - Snacks: 0, no concentrated sweets  -No CKD, last BUN/creatinine:  Lab Results  Component Value Date   BUN 12 08/01/2017   CREATININE 0.77 08/01/2017  Not on an ACE inhibitor/ARB .  -+ HL; last set of lipids: Lab Results  Component Value Date   CHOL 154 08/01/2017   HDL 32.70 (L) 08/01/2017   LDLCALC 71 06/16/2015   LDLDIRECT 103.0 08/01/2017   TRIG 227.0 (H) 08/01/2017   CHOLHDL 5  08/01/2017  On simvastatin.  - last eye exam was in 06/2017: No DR. + glaucoma- on eyedrops - + Numbness and tingling in her feet.  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  She did not have RAI treatment.  Neck U/S (11/24/2014): Post thyroidectomy.  No suspicious lymphadenopathy or soft tissue.  Pt is on levothyroxine 175 mcg daily, taken: - Still missing doses! - in am - fasting - at least 30 min from b'fast - no Ca, MVI - + Fe at lunch and at bedtime - + PPI at night - not on Biotin  Review TSH levels: Levels were very fluctuating due to incomplete medication compliance.  At last check, her TSH was higher than normal, despite not changing her levothyroxine dose: Lab Results  Component Value Date   TSH 6.32 (H) 08/01/2017   TSH 2.83 05/23/2017   TSH 14.73 (H) 07/26/2016   TSH 9.17 (A) 04/02/2016   TSH 11.83 (H) 06/16/2015   TSH 1.82 11/03/2014   TSH 8.756 (H) 03/24/2014   TSH 2.96 02/14/2014   TSH 29.85 (H) 12/20/2013   TSH 1.86 09/04/2012   Pt denies: - feeling nodules in neck - hoarseness - choking - SOB with  lying down  She does have dysphagia, not new.  ROS: Constitutional: no weight gain/no weight loss, + fatigue, + subjective hyperthermia (hot flushes), no subjective hypothermia Eyes: no blurry vision, no xerophthalmia ENT: no sore throat, + see HPI Cardiovascular: no CP/no SOB/+ palpitations/no leg swelling Respiratory: no cough/no SOB/no wheezing Gastrointestinal: no N/no V+ D/no C/no acid reflux Musculoskeletal: + muscle aches/+ joint aches Skin: no rashes, no hair loss Neurological: no tremors/+ numbness/+ tingling/no dizziness  I reviewed pt's medications, allergies, PMH, social hx, family hx, and changes were documented in the history of present illness. Otherwise, unchanged  from my initial visit note.  Past Medical History:  Diagnosis Date  . Allergy    SEASONAL  . Anemia    iron deficiency  . Anxiety   . Arthritis   . Asthma    a. 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  . Cellulitis and abscess of leg 02/2014  . CHF (congestive heart failure) ([place])   . Chronic kidney disease    /13  . Complication of anesthesia    DIFFICULT WAKING- 2011  . Depression    a. Stress reaction 08/2011 in multiple social stressors.  . Diabetes mellitus   . Dysrhythmia   . Gastritis   . GERD (gastroesophageal reflux disease)   . Heart murmur   . Hemorrhoid   . Hyperlipidemia   . Hypertension   . Hyperthyroidism   . Insomnia   . Morbid obesity ([place])   . OSA (obstructive sleep apnea) 01/17/2012  . Papillary thyroid carcinoma ([place])   . PCOS (polycystic ovarian syndrome)   . Sleep apnea    C PAP  . Vertigo    Past Surgical History:  Procedure Laterality Date  . CHOLECYSTECTOMY    . HEMORRHOID SURGERY    . INTRAUTERINE DEVICE INSERTION  2011  . SHOULDER SURGERY  06/2009  . THYROIDECTOMY  04/10/2012   Procedure: THYROIDECTOMY;  Surgeon: Jerrell Belfast, MD;  Location: [facility];  Service: ENT;  Laterality: N/A;  Total Thyroidectomy  . TONSILLECTOMY AND ADENOIDECTOMY  CHILD  . TUBAL LIGATION     Social History   Socioeconomic History  . Marital status: Married    Spouse name: Not on file  . Number of children: Not on file  . Years of education: Not on file  . Highest education level: Not on file  Occupational History  . Not on file  Social Needs  . Financial resource strain: Not on file  . Food insecurity:    Worry: Not on file    Inability: Not on file  . Transportation needs:    Medical: Not on file    Non-medical: Not on file  Tobacco Use  . Smoking status: Former Smoker    Packs/day: 0.20    Years: 5.00    Pack years: 1.00    Last attempt to quit: 03/25/2004    Years since quitting: 13.4  . Smokeless tobacco:  Never Used  Substance and Sexual Activity  . Alcohol use: No    Alcohol/week: 0.0 oz  . Drug use: No  . Sexual activity: Yes  Lifestyle  . Physical activity:    Days per week: Not on file    Minutes per session: Not on file  . Stress: Not on file  Relationships  . Social connections:    Talks on phone: Not on file    Gets together: Not on file    Attends religious service: Not on file  Active member of club or organization: Not on file    Attends meetings of clubs or organizations: Not on file    Relationship status: Not on file  . Intimate partner violence:    Fear of current or ex partner: Not on file    Emotionally abused: Not on file    Physically abused: Not on file    Forced sexual activity: Not on file  Other Topics Concern  . Not on file  Social History Narrative  . Not on file   Current Outpatient Medications on File Prior to Visit  Medication Sig Dispense Refill  . acetaminophen (TYLENOL) 325 MG tablet Take 1 tablet (325 mg total) by mouth every 4 (four) hours as needed (pain/fever).    Marland Kitchen albuterol (PROAIR HFA) 108 (90 BASE) MCG/ACT inhaler Inhale 2 puffs into the lungs every 4 (four) hours as needed. For wheezing 1 Inhaler 4  . B-D ULTRAFINE III SHORT PEN 31G X 8 MM MISC USE ONCE A DAY 100 each 1  . buPROPion (WELLBUTRIN XL) 300 MG 24 hr tablet Take 1 tablet (300 mg total) by mouth daily. 90 tablet 3  . carvedilol (COREG) 3.125 MG tablet Take 1 tablet (3.125 mg total) by mouth 2 (two) times daily with a meal. 60 tablet 11  . cetirizine (ZYRTEC) 10 MG tablet TAKE 1 TABLET BY MOUTH EVERY DAY 30 tablet 5  . Continuous Blood Gluc Sensor (FREESTYLE LIBRE 14 DAY SENSOR) MISC 1 each by Does not apply route every 14 (fourteen) days. Change every 2 weeks 2 each 11  . ferrous sulfate 325 (65 FE) MG tablet Take 325 mg by mouth 2 (two) times daily.      . fluticasone (FLONASE) 50 MCG/ACT nasal spray Place 2 sprays into both nostrils daily as needed for allergies. 16 g 11  .  glucose blood (ONE TOUCH ULTRA TEST) test strip Use 2x a day 100 each 2  . INVOKANA 300 MG TABS tablet Take 1 tablet (300 mg total) by mouth daily before breakfast. 30 tablet 5  . KLOR-CON M20 20 MEQ tablet TAKE 1 TABLET BY MOUTH 2 TIMES A WEEK (TAKE WITH TORSEMIDE) 30 tablet 5  . levothyroxine (SYNTHROID, LEVOTHROID) 175 MCG tablet TAKE 1 TABLET BY MOUTH EVERY DAY BEFORE BREAKFAST 90 tablet 0  . levothyroxine (SYNTHROID, LEVOTHROID) 175 MCG tablet TAKE 1 TABLET BY MOUTH EVERY DAY BEFORE BREAKFAST 30 tablet 0  . losartan (COZAAR) 50 MG tablet Take 1 tablet (50 mg total) by mouth daily. 30 tablet 0  . metFORMIN (GLUCOPHAGE) 1000 MG tablet TAKE 1 TABLET BY MOUTH TWICE A DAY WITH A MEAL 180 tablet 3  . pantoprazole (PROTONIX) 40 MG tablet Take 1 tablet (40 mg total) by mouth daily. 30 tablet 11  . sertraline (ZOLOFT) 100 MG tablet Take 1.5 tablets (150 mg total) by mouth daily. 30 tablet 11  . simvastatin (ZOCOR) 20 MG tablet Take 1 tablet (20 mg total) by mouth every evening. 30 tablet 11  . sitaGLIPtin (JANUVIA) 100 MG tablet Take 1 tablet (100 mg total) by mouth daily. 30 tablet 5  . spironolactone (ALDACTONE) 25 MG tablet TAKE 1 TABLET (25 MG TOTAL) BY MOUTH DAILY. 30 tablet 11  . torsemide (DEMADEX) 20 MG tablet Take 20 mg by mouth daily as needed (fluid).     Nelva Nay SOLOSTAR 300 UNIT/ML SOPN INJECT 90 UNITS INTO THE SKIN AT BEDTIME (6 BOXES) 27 pen 1  . TRAVATAN Z 0.004 % SOLN ophthalmic solution Place 1 drop  into both eyes at bedtime.     Marland Kitchen zolpidem (AMBIEN) 10 MG tablet TAKE 1/2 TO 1 TABLET AT BEDTIME AS NEEDED 30 tablet 3   No current facility-administered medications on file prior to visit.    Allergies  Allergen Reactions  . Tape Rash    Plastic tape  . Buspirone Hcl Nausea Only  . Glipizide     REACTION: sleepy, low sugar  . Ivp Dye [Iodinated Diagnostic Agents]     Shut kidneys down  . Oxycodone-Acetaminophen Itching   Family History  Problem Relation Age of Onset  .  Cancer Father        colon cancer  . Arthritis Father   . Colon cancer Father   . Colon polyps Father   . Arthritis Mother   . Asthma Mother   . Hypertension Mother   . Ovarian cancer Maternal Aunt   . Diabetes Paternal Aunt   . Diabetes Paternal Uncle   . Heart disease Maternal Grandfather     PE: BP 140/86   Pulse 63   Ht 5' 5" (1.651 m)   Wt 263 lb 12.8 oz (119.7 kg)   SpO2 98%   BMI 43.90 kg/m  Wt Readings from Last 3 Encounters:  09/12/17 263 lb 12.8 oz (119.7 kg)  08/22/17 270 lb (122.5 kg)  08/08/17 264 lb 8 oz (120 kg)   Constitutional: overweight, in NAD Eyes: PERRLA, EOMI, no exophthalmos ENT: moist mucous membranes, no thyromegaly, no cervical lymphadenopathy Cardiovascular: RRR, + 2/6 SEM no RG Respiratory: CTA B Gastrointestinal: abdomen soft, NT, ND, BS+ Musculoskeletal: no deformities, strength intact in all 4 Skin: moist, warm, no rashes Neurological: no tremor with outstretched hands, DTR normal in all 4  ASSESSMENT: 1. DM2, insulin-dependent, uncontrolled, without complications  2. Iatrogenic Hypothyroidism - h/o med noncompliance  3. [place] -No RAI treatment was necessary  PLAN:  1. Patient with history of uncontrolled diabetes, on basal insulin regimen and oral antidiabetic regimen, with improvement of her sugars after adding Invokana and Januvia but latest A1c increased to 7.7%.  Was not checking sugars at all.  I strongly advised her to start.  We did discuss at that point about the possibility of switching from Januvia to Trulicity but she wanted to work on her diet and activity level first, before adding this medication. - at this visit, sugars are maybe a little better - she has some high CBGs after sodas >> advised to stop regular sodas - however, as sugars are still high >> will change from Januvia to Ozempic >> explained expected benefits and possible SEs.will start at a low dose and advised as tolerated. - I suggested to:   Patient Instructions  Please continue: - Toujeo 45 x 2 injections at night - Metformin 1000 mg 2x a day - Invokana 300 mg in a.m.  Please start Ozempic 0.25 mg once a week before b'fast x 4 weeks, then increase to 0.5 mg weekly.  Stop Januvia after starting Ozempic.  Please continue Levothyroxine 175 mcg daily.  Take the thyroid hormone every day, with water, at least 30 minutes before breakfast, separated by at least 4 hours from: - acid reflux medications - calcium - iron - multivitamins  Please return in 3-4 months with your sugar log.   - today, HbA1c is 7.5% (slightly better) - continue checking sugars at different times of the day - check 1x a day, rotating checks - advised for yearly eye exams >> she is UTD - Return to  clinic in 3-4 mo with sugar log   2. Iatrogenic Hypothyroidism - Uncontrolled, due to incomplete compliance with her levothyroxine doses - latest thyroid labs reviewed with pt >> normal  - she continues on LT4 175 mcg daily - pt feels good on this dose. - we discussed about taking the thyroid hormone every day, with water, >30 minutes before breakfast, separated by >4 hours from acid reflux medications, calcium, iron, multivitamins. Pt. is taking it correctly but still skipping doses. - will check TFTs at next visit >> strongly advised to take LT4 DAILY.  3. Follicular PTC - neck ultrasound was normal in 11/2014 - No neck compression symptoms  But still has have some dysphagia  >> discussed to check a new neck U/S >> ordered  Philemon Kingdom, MD PhD [facility] Endocrinology

## 2017-09-12 NOTE — Addendum Note (Signed)
Addended by: Drucilla Schmidt on: 09/12/2017 01:56 PM   Modules accepted: Orders

## 2017-09-12 NOTE — Patient Instructions (Addendum)
Please continue: - Toujeo 45 x 2 injections at night - Metformin 1000 mg 2x a day - Invokana 300 mg in a.m.  Please start Ozempic 0.25 mg once a week before b'fast x 4 weeks, then increase to 0.5 mg weekly.  Stop Januvia after starting Ozempic.  Please continue Levothyroxine 175 mcg daily.  Take the thyroid hormone every day, with water, at least 30 minutes before breakfast, separated by at least 4 hours from: - acid reflux medications - calcium - iron - multivitamins  Please return in 3-4 months with your sugar log.

## 2017-09-29 ENCOUNTER — Other Ambulatory Visit: Payer: BLUE CROSS/BLUE SHIELD

## 2017-09-29 ENCOUNTER — Other Ambulatory Visit: Payer: Self-pay | Admitting: *Deleted

## 2017-09-29 MED ORDER — CETIRIZINE HCL 10 MG PO TABS: 10.0000 mg | ORAL_TABLET | Freq: Every day | ORAL | 5 refills | Status: DC

## 2017-10-01 ENCOUNTER — Other Ambulatory Visit (HOSPITAL_COMMUNITY): Payer: Self-pay | Admitting: Internal Medicine

## 2017-10-03 ENCOUNTER — Other Ambulatory Visit: Payer: Self-pay | Admitting: *Deleted

## 2017-10-03 MED ORDER — SERTRALINE HCL 100 MG PO TABS: 150.0000 mg | ORAL_TABLET | Freq: Every day | ORAL | 2 refills | Status: DC

## 2017-10-03 NOTE — Telephone Encounter (Signed)
Received a faxed refill request from pharmacy to change script to a 90 day supply. Updated refills sent to pharmacy per their request.

## 2017-10-07 ENCOUNTER — Ambulatory Visit
Admission: RE | Admit: 2017-10-07 | Discharge: 2017-10-07 | Disposition: A | Discharge status: Home / Self Care | Payer: BLUE CROSS/BLUE SHIELD | Source: Ambulatory Visit | Attending: Internal Medicine | Admitting: Internal Medicine

## 2017-10-18 ENCOUNTER — Other Ambulatory Visit: Payer: Self-pay | Admitting: Internal Medicine

## 2017-10-28 ENCOUNTER — Other Ambulatory Visit (HOSPITAL_COMMUNITY): Payer: Self-pay | Admitting: Internal Medicine

## 2017-11-18 ENCOUNTER — Other Ambulatory Visit (HOSPITAL_COMMUNITY): Payer: Self-pay | Admitting: Internal Medicine

## 2017-12-03 ENCOUNTER — Telehealth (HOSPITAL_COMMUNITY): Payer: Self-pay | Admitting: Cardiology

## 2017-12-03 NOTE — Telephone Encounter (Signed)
Patient called to request an appointment. Patient reports increase in fatigue, increase in HR, unsure of weights as she does not check daily, light headed. Reports she is also going through a lot of stress and anxiety.   Advised she should ALSO contact provider that manages anxiety as this does not 100% sound cardiac in nature however would be more than happy to rule out HF flare.

## 2017-12-06 ENCOUNTER — Other Ambulatory Visit: Payer: Self-pay | Admitting: Internal Medicine

## 2017-12-11 ENCOUNTER — Other Ambulatory Visit: Payer: Self-pay | Admitting: Family Medicine

## 2017-12-12 NOTE — Telephone Encounter (Signed)
Name of Medication: Ambien Name of Pharmacy: [facility] or Written Date and Quantity: 06/30/17 #30 tabs with 3 additional refills Last Office Visit and Type: 08/04/17 for a 2-3 week f/u Next Office Visit and Type: none scheduled  Last Controlled Substance Agreement Date: 10/20/12 Last UDS:10/20/12

## 2017-12-14 NOTE — Progress Notes (Signed)
ADVANCED HF CLINIC NOTE  Patient ID: [name], female   DOB: 09-May-1965, 52 y.o.   MRN: [id number]  PCP: Dr Glori Bickers Pulmonary: Dr. Lamonte Sakai ENT: Dr Norma Fredrickson Endocrinologist: Dr Renne Crigler  HPI: [name] is a 52 y.o. female with hx of DM, HTN, HL, morbid obesity, OSA noncompliant with CPAP due to finances, anxiety, diastolic HF with secondary pulmonary HTN and noncardiac CP. Cath 2006 for CP with only catheter-induced spasm of RCA otherwise normal coronaries. EF was 65% at that time. Developed acute renal failure after CT contrast.   Nuclear Stress 02/17/12: Normal, EF 58%  Echo 10/24: EF 50-55%. Grade 2 diastolic dysfunction. RV mildly dilated. PAPP 68 mmHg  08/18/12 ECHO EF 42% Grade 2 diastolic dysfunction. RV looks good. No significant TR to measure PA pressure.  Echo 7/15: EF 55-60% Normal RV Myoview 7/15: RF and perfusion normal   S/P Thyroidectomy January 2014.   She returns today for HF follow up. Last seen 07/2016. Called HF clinic earlier this month with c/o fatigue and elevated HR. She is under a lot of stress and has several concerns today. She has been having "fluttering" in her chest. It started 2 months ago. It occurs about every other day and lasts a few minutes. Seems to occur more frequently when she is lying in bed. She also says her HR has been slower during the day and she occasionally feels a "thump" in her chest, which makes her lightheaded. She is typically able to walk up 25 steps for work with no problems. No orthopnea or PND. Wearing CPAP qHS. Occasional BLE edema, but has not taken PRN torsemide. She has also been more fatigued lately. No SOB or CP with activity. She does have associated chest pressure and SOB with anxiety and when she has palpitations. She also has BUE weakness that has been ongoing for 6 months. Her mother had MS and she is worried that she may have it too. Weight down 12 lbs on our scale.   Review of systems complete and found to be  negative unless listed in HPI.   Past Medical History:  Diagnosis Date  . Allergy    SEASONAL  . Anemia    iron deficiency  . Anxiety   . Arthritis   . Asthma    a. 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  . Cellulitis and abscess of leg 02/2014  . CHF (congestive heart failure) ([place])   . Chronic kidney disease    /13  . Complication of anesthesia    DIFFICULT WAKING- 2011  . Depression    a. Stress reaction 08/2011 in multiple social stressors.  . Diabetes mellitus   . Dysrhythmia   . Gastritis   . GERD (gastroesophageal reflux disease)   . Heart murmur   . Hemorrhoid   . Hyperlipidemia   . Hypertension   . Hyperthyroidism   . Insomnia   . Morbid obesity ([place])   . OSA (obstructive sleep apnea) 01/17/2012  . Papillary thyroid carcinoma ([place])   . PCOS (polycystic ovarian syndrome)   . Sleep apnea    C PAP  . Vertigo     Current Outpatient Medications  Medication Sig Dispense Refill  . acetaminophen (TYLENOL) 325 MG tablet Take 1 tablet (325 mg total) by mouth every 4 (four) hours as needed (pain/fever).    Marland Kitchen albuterol (PROAIR HFA) 108 (90 BASE) MCG/ACT inhaler Inhale 2 puffs into the lungs every 4 (four) hours  as needed. For wheezing 1 Inhaler 4  . buPROPion (WELLBUTRIN XL) 300 MG 24 hr tablet Take 1 tablet (300 mg total) by mouth daily. 90 tablet 3  . carvedilol (COREG) 3.125 MG tablet Take 1 tablet (3.125 mg total) by mouth 2 (two) times daily with a meal. 60 tablet 11  . cetirizine (ZYRTEC) 10 MG tablet Take 1 tablet (10 mg total) by mouth daily. 30 tablet 5  . Continuous Blood Gluc Sensor (FREESTYLE LIBRE 14 DAY SENSOR) MISC 1 each by Does not apply route every 14 (fourteen) days. Change every 2 weeks 2 each 11  . ferrous sulfate 325 (65 FE) MG tablet Take 325 mg by mouth 2 (two) times daily.      . fluticasone (FLONASE) 50 MCG/ACT nasal spray Place 2 sprays into both nostrils daily as needed for allergies. 16 g 11  . glucose  blood (ONE TOUCH ULTRA TEST) test strip Use 2x a day 100 each 2  . Insulin Pen Needle (B-D ULTRAFINE III SHORT PEN) 31G X 8 MM MISC USE ONCE A DAY 100 each 3  . INVOKANA 300 MG TABS tablet Take 1 tablet (300 mg total) by mouth daily before breakfast. 30 tablet 11  . levothyroxine (SYNTHROID, LEVOTHROID) 175 MCG tablet TAKE 1 TABLET BY MOUTH EVERY DAY BEFORE BREAKFAST 90 tablet 3  . losartan (COZAAR) 50 MG tablet Take 1 tablet (50 mg total) by mouth daily. 30 tablet 5  . metFORMIN (GLUCOPHAGE) 1000 MG tablet TAKE 1 TABLET BY MOUTH TWICE A DAY WITH A MEAL 180 tablet 3  . pantoprazole (PROTONIX) 40 MG tablet Take 1 tablet (40 mg total) by mouth daily. 30 tablet 11  . Semaglutide (OZEMPIC) 0.25 or 0.5 MG/DOSE SOPN Inject 0.5 mg into the skin once a week. 2 pen 5  . sertraline (ZOLOFT) 100 MG tablet Take 1.5 tablets (150 mg total) by mouth daily. 135 tablet 2  . simvastatin (ZOCOR) 20 MG tablet Take 1 tablet (20 mg total) by mouth every evening. 30 tablet 11  . spironolactone (ALDACTONE) 25 MG tablet TAKE 1 TABLET (25 MG TOTAL) BY MOUTH DAILY. 30 tablet 11  . TOUJEO SOLOSTAR 300 UNIT/ML SOPN INJECT 90 UNITS INTO THE SKIN AT BEDTIME (6 BOXES) 27 pen 1  . TRAVATAN Z 0.004 % SOLN ophthalmic solution Place 1 drop into both eyes at bedtime.     Marland Kitchen zolpidem (AMBIEN) 10 MG tablet TAKE 1/2 TO 1 TABLET BY MOUTH AT BEDTIME AS NEEDED 30 tablet 3  . KLOR-CON M20 20 MEQ tablet TAKE 1 TABLET BY MOUTH 2 TIMES A WEEK (TAKE WITH TORSEMIDE) (Patient not taking: Reported on 12/15/2017) 30 tablet 5  . torsemide (DEMADEX) 20 MG tablet Take 20 mg by mouth daily as needed (fluid).      No current facility-administered medications for this encounter.    Wt Readings from Last 3 Encounters:  12/15/17 119.7 kg (263 lb 12.8 oz)  09/12/17 119.7 kg (263 lb 12.8 oz)  08/22/17 122.5 kg (270 lb)    PHYSICAL EXAM: Vitals:   12/15/17 1510  BP: (!) 160/86  Pulse: 79  Resp: 18  SpO2: 98%  Weight: 119.7 kg (263 lb 12.8 oz)    General: Well appearing. No resp difficulty. HEENT: Normal Neck: Supple. JVP difficult. Carotids 2+ bilat; no bruits. No thyromegaly or nodule noted. Cor: PMI nondisplaced. RRR, no murmurs, rubs, gallops Lungs: clear, diminished throughout. Abdomen: Soft, non-tender, non-distended, no HSM. No bruits or masses. +BS  Extremities: No cyanosis, clubbing, or rash.  R and LLE no edema.  Neuro: Alert & orientedx3, cranial nerves grossly intact. moves all 4 extremities w/o difficulty. Affect pleasant  EKG: NSR 64 bpm. Personally reviewed.   ASSESSMENT & PLAN: 1. Chronic diastolic heart failure - Volume status stable on exam. Dr Haroldine Laws reviewed previous echo and she has at least moderate PH. 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 Echo 08/2016 unable to visualize tricuspid valve, EF 60-65%. I did not hear TR today. - Takes torsemide PRN. Has not needed any recently. - Continue spiro 25 mg daily. BMET today.  - Repeat echo.  2. Pulmonary HTN - As above. No change.  3. Chest pain  - Previous cath 2006 and Myoview 2015 okay. - Having chest pressure with palpitations, but none with activity. Discussed with Dr Basilia Jumbo. Hold off on repeating myoview for right now. 4. OSA  - Continue CPAP. She is compliant. 5. Obesity - Body mass index is 43.9 kg/m. 6. HTN - Elevated today. Typically 140-160. Recheck SBP 168. Increase losartan to 100 mg daily. 7. Probable PFO on TTE - This is physiologic and seen in 30% of people. Right side is normal in size. No embolic phenomenon. No further w/u needed.  8. S/p thyroidectomy -> Hypothyroidism on synthroid - Check TSH today with palpitations and fatigue. Will forward to endocrinologist (Dr Cruzita Lederer) if abnormal. 9. Palpitations - Ziopatch 14 days for palpitations - Check K, mag, and TSH  Repeat Echo Ziopatch for palpitations - 14 days BMET, BNP, TSH, mag today Increase losartan to 100 mg daily Follow up 3-4 weeks to discuss  Ziopatch results.  Georgiana Shore NP 3:22 PM  Greater than 50% of the 25 minute visit was spent in counseling/coordination of care regarding disease state education, salt/fluid restriction, sliding scale diuretics, and medication compliance.

## 2017-12-15 ENCOUNTER — Other Ambulatory Visit (HOSPITAL_COMMUNITY): Payer: Self-pay

## 2017-12-15 ENCOUNTER — Ambulatory Visit (HOSPITAL_COMMUNITY)
Admission: RE | Admit: 2017-12-15 | Discharge: 2017-12-15 | Disposition: A | Discharge status: Home / Self Care | Payer: No Typology Code available for payment source | Source: Ambulatory Visit | Attending: Cardiology | Admitting: Cardiology

## 2017-12-15 ENCOUNTER — Ambulatory Visit (HOSPITAL_COMMUNITY)
Admission: RE | Admit: 2017-12-15 | Discharge: 2017-12-15 | Disposition: A | Discharge status: Home / Self Care | Payer: No Typology Code available for payment source | Source: Ambulatory Visit | Attending: Internal Medicine | Admitting: Internal Medicine

## 2017-12-15 VITALS — BP 160/86 | HR 79 | Resp 18 | Wt 263.8 lb

## 2017-12-15 DIAGNOSIS — Z9119 Patient's noncompliance with other medical treatment and regimen: Secondary | ICD-10-CM | POA: Diagnosis not present

## 2017-12-15 DIAGNOSIS — Z794 Long term (current) use of insulin: Secondary | ICD-10-CM | POA: Insufficient documentation

## 2017-12-15 DIAGNOSIS — N189 Chronic kidney disease, unspecified: Secondary | ICD-10-CM | POA: Diagnosis not present

## 2017-12-15 DIAGNOSIS — I10 Essential (primary) hypertension: Secondary | ICD-10-CM | POA: Diagnosis not present

## 2017-12-15 DIAGNOSIS — Z79890 Hormone replacement therapy (postmenopausal): Secondary | ICD-10-CM | POA: Diagnosis not present

## 2017-12-15 DIAGNOSIS — I272 Pulmonary hypertension, unspecified: Secondary | ICD-10-CM | POA: Insufficient documentation

## 2017-12-15 DIAGNOSIS — E785 Hyperlipidemia, unspecified: Secondary | ICD-10-CM | POA: Insufficient documentation

## 2017-12-15 DIAGNOSIS — E039 Hypothyroidism, unspecified: Secondary | ICD-10-CM

## 2017-12-15 DIAGNOSIS — K219 Gastro-esophageal reflux disease without esophagitis: Secondary | ICD-10-CM | POA: Insufficient documentation

## 2017-12-15 DIAGNOSIS — R002 Palpitations: Secondary | ICD-10-CM

## 2017-12-15 DIAGNOSIS — F329 Major depressive disorder, single episode, unspecified: Secondary | ICD-10-CM | POA: Insufficient documentation

## 2017-12-15 DIAGNOSIS — E282 Polycystic ovarian syndrome: Secondary | ICD-10-CM | POA: Diagnosis not present

## 2017-12-15 DIAGNOSIS — Z79899 Other long term (current) drug therapy: Secondary | ICD-10-CM | POA: Insufficient documentation

## 2017-12-15 DIAGNOSIS — Z6841 Body Mass Index (BMI) 40.0 and over, adult: Secondary | ICD-10-CM | POA: Diagnosis not present

## 2017-12-15 DIAGNOSIS — F419 Anxiety disorder, unspecified: Secondary | ICD-10-CM | POA: Insufficient documentation

## 2017-12-15 DIAGNOSIS — I5032 Chronic diastolic (congestive) heart failure: Secondary | ICD-10-CM

## 2017-12-15 DIAGNOSIS — J45909 Unspecified asthma, uncomplicated: Secondary | ICD-10-CM | POA: Diagnosis not present

## 2017-12-15 DIAGNOSIS — E1122 Type 2 diabetes mellitus with diabetic chronic kidney disease: Secondary | ICD-10-CM | POA: Insufficient documentation

## 2017-12-15 DIAGNOSIS — G4733 Obstructive sleep apnea (adult) (pediatric): Secondary | ICD-10-CM

## 2017-12-15 DIAGNOSIS — Z9989 Dependence on other enabling machines and devices: Secondary | ICD-10-CM

## 2017-12-15 DIAGNOSIS — I130 Hypertensive heart and chronic kidney disease with heart failure and stage 1 through stage 4 chronic kidney disease, or unspecified chronic kidney disease: Secondary | ICD-10-CM | POA: Insufficient documentation

## 2017-12-15 DIAGNOSIS — R079 Chest pain, unspecified: Secondary | ICD-10-CM

## 2017-12-15 LAB — BASIC METABOLIC PANEL
Anion gap: 13 (ref 5–15)
BUN: 9 mg/dL (ref 6–20)
CALCIUM: 9.5 mg/dL (ref 8.9–10.3)
CHLORIDE: 98 mmol/L (ref 98–111)
CO2: 30 mmol/L (ref 22–32)
CREATININE: 0.8 mg/dL (ref 0.44–1.00)
GFR calc Af Amer: >60 mL/min (ref >60)
GFR calc non Af Amer: >60 mL/min (ref >60)
Glucose, Bld: 176 mg/dL — ABNORMAL HIGH (ref 70–99)
Potassium: 3 mmol/L — ABNORMAL LOW (ref 3.5–5.1)
Sodium: 141 mmol/L (ref 135–145)

## 2017-12-15 LAB — TSH: TSH: 2.011 u[IU]/mL (ref 0.350–4.500)

## 2017-12-15 LAB — MAGNESIUM: Magnesium: 1.7 mg/dL (ref 1.7–2.4)

## 2017-12-15 LAB — BRAIN NATRIURETIC PEPTIDE: B Natriuretic Peptide: 109.7 pg/mL — ABNORMAL HIGH (ref 0.0–100.0)

## 2017-12-15 MED ORDER — LOSARTAN POTASSIUM 50 MG PO TABS: 50.0000 mg | ORAL_TABLET | Freq: Every day | ORAL | 5 refills | Status: DC

## 2017-12-15 MED ORDER — LOSARTAN POTASSIUM 100 MG PO TABS: 100.0000 mg | ORAL_TABLET | Freq: Every day | ORAL | 3 refills | Status: DC

## 2017-12-15 NOTE — Patient Instructions (Signed)
INCREASE Losartan to 100 mg, one tab daily  Labs today We will only contact you if something comes back abnormal or we need to make some changes. Otherwise no news is good news!  Your physician has requested that you have an echocardiogram. Echocardiography is a painless test that uses sound waves to create images of your heart. It provides your doctor with information about the size and shape of your heart and how well your heart's chambers and valves are working. This procedure takes approximately one hour. There are no restrictions for this procedure.  Your physician recommends that you schedule a follow-up appointment in: 4 weeks  in the Advanced Practitioners (PA/NP) Clinic   Your physician has recommended that you wear a holter monitor. Holter monitors are medical devices that record the heart's electrical activity. Doctors most often use these monitors to diagnose arrhythmias. Arrhythmias are problems with the speed or rhythm of the heartbeat. The monitor is a small, portable device. You can wear one while you do your normal daily activities. This is usually used to diagnose what is causing palpitations/syncope (passing out).  Do the following things EVERYDAY: 1) Weigh yourself in the morning before breakfast. Write it down and keep it in a log. 2) Take your medicines as prescribed 3) Eat low salt foods-Limit salt (sodium) to 2000 mg per day.  4) Stay as active as you can everyday 5) Limit all fluids for the day to less than 2 liters

## 2017-12-18 ENCOUNTER — Telehealth (HOSPITAL_COMMUNITY): Payer: Self-pay

## 2017-12-18 MED ORDER — MAGNESIUM OXIDE -MG SUPPLEMENT 200 MG PO TABS: 200.0000 mg | ORAL_TABLET | Freq: Every day | ORAL | 3 refills | Status: DC

## 2017-12-18 MED ORDER — POTASSIUM CHLORIDE CRYS ER 20 MEQ PO TBCR: 20.0000 meq | EXTENDED_RELEASE_TABLET | Freq: Every day | ORAL | 5 refills | Status: DC

## 2017-12-19 NOTE — Telephone Encounter (Signed)
Opened in error

## 2017-12-22 ENCOUNTER — Encounter (HOSPITAL_COMMUNITY): Payer: Self-pay | Admitting: Cardiology

## 2017-12-22 ENCOUNTER — Telehealth: Payer: Self-pay

## 2017-12-22 NOTE — Telephone Encounter (Signed)
I spoke with [place] at [facility] and they have not received a wellbutrin XL 300 mg rx. Per pts med list on 08/22/17 rx for wellbutrin XL 300 mg was printed and not sent electronically.pt was in office on 08/22/17 and the AVS has with pills left take wellbutrin 150 mg taking 2 pills once daily until gone then new rx wellbutrin XL 300 mg taking one daily. Unable to reach pt by phone to see how pt has been taking med and to see if has rx from may visit.

## 2017-12-22 NOTE — Telephone Encounter (Signed)
Copied from [place] [id number]. Topic: General - Other >> Dec 22, 2017 10:35 AM Carolyn Stare wrote:  Pt cal lto say the pharmacy need a new RX for buPROPion (WELLBUTRIN XL) 300 MG 24 hr tablet   they still have the RX for 150 on file   Pharmacy [facility] [place]

## 2017-12-25 ENCOUNTER — Ambulatory Visit (HOSPITAL_COMMUNITY)
Admission: RE | Admit: 2017-12-25 | Discharge: 2017-12-25 | Disposition: A | Discharge status: Home / Self Care | Payer: No Typology Code available for payment source | Source: Ambulatory Visit | Attending: Cardiology | Admitting: Cardiology

## 2017-12-25 ENCOUNTER — Other Ambulatory Visit (HOSPITAL_COMMUNITY): Payer: No Typology Code available for payment source

## 2017-12-25 DIAGNOSIS — I5032 Chronic diastolic (congestive) heart failure: Secondary | ICD-10-CM | POA: Insufficient documentation

## 2017-12-25 LAB — BASIC METABOLIC PANEL
Anion gap: 11 (ref 5–15)
BUN: 16 mg/dL (ref 6–20)
CHLORIDE: 102 mmol/L (ref 98–111)
CO2: 26 mmol/L (ref 22–32)
Calcium: 9.9 mg/dL (ref 8.9–10.3)
Creatinine, Ser: 0.66 mg/dL (ref 0.44–1.00)
GFR calc Af Amer: >60 mL/min (ref >60)
GFR calc non Af Amer: >60 mL/min (ref >60)
GLUCOSE: 153 mg/dL — AB (ref 70–99)
POTASSIUM: 4.8 mmol/L (ref 3.5–5.1)
Sodium: 139 mmol/L (ref 135–145)

## 2017-12-25 LAB — MAGNESIUM: MAGNESIUM: 2 mg/dL (ref 1.7–2.4)

## 2017-12-25 MED ORDER — BUPROPION HCL ER (XL) 300 MG PO TB24: 300.0000 mg | ORAL_TABLET | Freq: Every day | ORAL | 1 refills | Status: DC

## 2017-12-31 ENCOUNTER — Other Ambulatory Visit (HOSPITAL_COMMUNITY): Payer: Self-pay | Admitting: Cardiology

## 2018-01-14 ENCOUNTER — Encounter (HOSPITAL_COMMUNITY): Payer: Self-pay

## 2018-01-14 ENCOUNTER — Ambulatory Visit (HOSPITAL_COMMUNITY)
Admission: RE | Admit: 2018-01-14 | Discharge: 2018-01-14 | Disposition: A | Discharge status: Home / Self Care | Payer: No Typology Code available for payment source | Source: Ambulatory Visit | Attending: Family Medicine | Admitting: Family Medicine

## 2018-01-14 ENCOUNTER — Ambulatory Visit (HOSPITAL_BASED_OUTPATIENT_CLINIC_OR_DEPARTMENT_OTHER)
Admission: RE | Admit: 2018-01-14 | Discharge: 2018-01-14 | Disposition: A | Discharge status: Home / Self Care | Payer: No Typology Code available for payment source | Source: Ambulatory Visit | Attending: Cardiology | Admitting: Cardiology

## 2018-01-14 VITALS — BP 150/92 | HR 64 | Wt 265.2 lb

## 2018-01-14 DIAGNOSIS — F329 Major depressive disorder, single episode, unspecified: Secondary | ICD-10-CM | POA: Insufficient documentation

## 2018-01-14 DIAGNOSIS — R002 Palpitations: Secondary | ICD-10-CM | POA: Insufficient documentation

## 2018-01-14 DIAGNOSIS — I5032 Chronic diastolic (congestive) heart failure: Secondary | ICD-10-CM | POA: Diagnosis present

## 2018-01-14 DIAGNOSIS — M1990 Unspecified osteoarthritis, unspecified site: Secondary | ICD-10-CM | POA: Insufficient documentation

## 2018-01-14 DIAGNOSIS — N189 Chronic kidney disease, unspecified: Secondary | ICD-10-CM | POA: Insufficient documentation

## 2018-01-14 DIAGNOSIS — I130 Hypertensive heart and chronic kidney disease with heart failure and stage 1 through stage 4 chronic kidney disease, or unspecified chronic kidney disease: Secondary | ICD-10-CM | POA: Diagnosis not present

## 2018-01-14 DIAGNOSIS — I493 Ventricular premature depolarization: Secondary | ICD-10-CM | POA: Insufficient documentation

## 2018-01-14 DIAGNOSIS — E785 Hyperlipidemia, unspecified: Secondary | ICD-10-CM | POA: Diagnosis not present

## 2018-01-14 DIAGNOSIS — E890 Postprocedural hypothyroidism: Secondary | ICD-10-CM | POA: Diagnosis not present

## 2018-01-14 DIAGNOSIS — Z85850 Personal history of malignant neoplasm of thyroid: Secondary | ICD-10-CM | POA: Diagnosis not present

## 2018-01-14 DIAGNOSIS — Z794 Long term (current) use of insulin: Secondary | ICD-10-CM | POA: Diagnosis not present

## 2018-01-14 DIAGNOSIS — J45909 Unspecified asthma, uncomplicated: Secondary | ICD-10-CM | POA: Insufficient documentation

## 2018-01-14 DIAGNOSIS — I272 Pulmonary hypertension, unspecified: Secondary | ICD-10-CM | POA: Diagnosis not present

## 2018-01-14 DIAGNOSIS — Z9119 Patient's noncompliance with other medical treatment and regimen: Secondary | ICD-10-CM | POA: Insufficient documentation

## 2018-01-14 DIAGNOSIS — E282 Polycystic ovarian syndrome: Secondary | ICD-10-CM | POA: Diagnosis not present

## 2018-01-14 DIAGNOSIS — R079 Chest pain, unspecified: Secondary | ICD-10-CM | POA: Insufficient documentation

## 2018-01-14 DIAGNOSIS — Z6841 Body Mass Index (BMI) 40.0 and over, adult: Secondary | ICD-10-CM | POA: Diagnosis not present

## 2018-01-14 DIAGNOSIS — F419 Anxiety disorder, unspecified: Secondary | ICD-10-CM | POA: Diagnosis not present

## 2018-01-14 DIAGNOSIS — E1122 Type 2 diabetes mellitus with diabetic chronic kidney disease: Secondary | ICD-10-CM | POA: Insufficient documentation

## 2018-01-14 DIAGNOSIS — G4733 Obstructive sleep apnea (adult) (pediatric): Secondary | ICD-10-CM | POA: Insufficient documentation

## 2018-01-14 DIAGNOSIS — I10 Essential (primary) hypertension: Secondary | ICD-10-CM | POA: Diagnosis not present

## 2018-01-14 DIAGNOSIS — K219 Gastro-esophageal reflux disease without esophagitis: Secondary | ICD-10-CM | POA: Insufficient documentation

## 2018-01-14 DIAGNOSIS — Z79899 Other long term (current) drug therapy: Secondary | ICD-10-CM | POA: Diagnosis not present

## 2018-01-14 DIAGNOSIS — Z9989 Dependence on other enabling machines and devices: Secondary | ICD-10-CM

## 2018-01-14 MED ORDER — PERFLUTREN LIPID MICROSPHERE
4.0000 mL | Freq: Once | INTRAVENOUS | Status: AC
  Administered 2018-01-14: 4 mL via INTRAVENOUS
  Filled 2018-01-14: qty 4

## 2018-01-14 NOTE — Addendum Note (Signed)
Encounter addended by: Jorge Ny, LCSW on: 01/14/2018 4:55 PM  Actions taken: Sign clinical note
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
## 2018-01-14 NOTE — Patient Instructions (Signed)
It was great to see you today! No medication changes are needed at this time.   Your physician recommends that you schedule a follow-up appointment in: 3 months  in the Advanced Practitioners (PA/NP) Clinic    Do the following things EVERYDAY: 1) Weigh yourself in the morning before breakfast. Write it down and keep it in a log. 2) Take your medicines as prescribed 3) Eat low salt foods-Limit salt (sodium) to 2000 mg per day.  4) Stay as active as you can everyday 5) Limit all fluids for the day to less than 2 liters   

## 2018-01-14 NOTE — Progress Notes (Signed)
ADVANCED HF CLINIC NOTE  Patient ID: [name], female   DOB: 1966/01/20, 52 y.o.   MRN: [id number]  PCP: Dr Glori Bickers Pulmonary: Dr. Lamonte Sakai ENT: Dr Norma Fredrickson Endocrinologist: Dr Renne Crigler  HPI: [name] is a 52 y.o. female with hx of DM, HTN, HL, morbid obesity, OSA noncompliant with CPAP due to finances, anxiety, diastolic HF with secondary pulmonary HTN and noncardiac CP. Cath 2006 for CP with only catheter-induced spasm of RCA otherwise normal coronaries. EF was 65% at that time. Developed acute renal failure after CT contrast.   Nuclear Stress 02/17/12: Normal, EF 58%  Echo 10/24: EF 50-55%. Grade 2 diastolic dysfunction. RV mildly dilated. PAPP 68 mmHg  08/18/12 ECHO EF 78% Grade 2 diastolic dysfunction. RV looks good. No significant TR to measure PA pressure.  Echo 7/15: EF 55-60% Normal RV Myoview 7/15: RF and perfusion normal   S/P Thyroidectomy January 2014.   Zio Patch 12/2017 8% PVCs.  Today she returns for HF follow up. Last visit losartan was increased to 100 mg daily. Overall feeling just ok. Says she is stressed about her home situation and work.  Denies PND/Orthopnea. Mild dyspnea with inclines. Using CPAP every night. Appetite ok. No fever or chills. Weight at home 264-272 pounds. Using CPAP every night. Taking all medications.   Review of systems complete and found to be negative unless listed in HPI.   Past Medical History:  Diagnosis Date  . Allergy    SEASONAL  . Anemia    iron deficiency  . Anxiety   . Arthritis   . Asthma    a. 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  . Cellulitis and abscess of leg 02/2014  . CHF (congestive heart failure) ([place])   . Chronic kidney disease    /13  . Complication of anesthesia    DIFFICULT WAKING- 2011  . Depression    a. Stress reaction 08/2011 in multiple social stressors.  . Diabetes mellitus   . Dysrhythmia   . Gastritis   . GERD (gastroesophageal reflux disease)     . Heart murmur   . Hemorrhoid   . Hyperlipidemia   . Hypertension   . Hyperthyroidism   . Insomnia   . Morbid obesity ([place])   . OSA (obstructive sleep apnea) 01/17/2012  . Papillary thyroid carcinoma ([place])   . PCOS (polycystic ovarian syndrome)   . Sleep apnea    C PAP  . Vertigo     Current Outpatient Medications  Medication Sig Dispense Refill  . acetaminophen (TYLENOL) 325 MG tablet Take 1 tablet (325 mg total) by mouth every 4 (four) hours as needed (pain/fever).    Marland Kitchen albuterol (PROAIR HFA) 108 (90 BASE) MCG/ACT inhaler Inhale 2 puffs into the lungs every 4 (four) hours as needed. For wheezing 1 Inhaler 4  . buPROPion (WELLBUTRIN XL) 300 MG 24 hr tablet Take 1 tablet (300 mg total) by mouth daily. 90 tablet 1  . carvedilol (COREG) 3.125 MG tablet Take 1 tablet (3.125 mg total) by mouth 2 (two) times daily with a meal. 60 tablet 11  . cetirizine (ZYRTEC) 10 MG tablet Take 1 tablet (10 mg total) by mouth daily. 30 tablet 5  . ferrous sulfate 325 (65 FE) MG tablet Take 325 mg by mouth 2 (two) times daily.      . fluticasone (FLONASE) 50 MCG/ACT nasal spray Place 2 sprays into both nostrils daily as needed for allergies. 16 g 11  .  glucose blood (ONE TOUCH ULTRA TEST) test strip Use 2x a day 100 each 2  . Insulin Pen Needle (B-D ULTRAFINE III SHORT PEN) 31G X 8 MM MISC USE ONCE A DAY 100 each 3  . INVOKANA 300 MG TABS tablet Take 1 tablet (300 mg total) by mouth daily before breakfast. 30 tablet 11  . levothyroxine (SYNTHROID, LEVOTHROID) 175 MCG tablet TAKE 1 TABLET BY MOUTH EVERY DAY BEFORE BREAKFAST 90 tablet 3  . losartan (COZAAR) 100 MG tablet Take 1 tablet (100 mg total) by mouth daily. 30 tablet 3  . metFORMIN (GLUCOPHAGE) 1000 MG tablet TAKE 1 TABLET BY MOUTH TWICE A DAY WITH A MEAL 180 tablet 3  . pantoprazole (PROTONIX) 40 MG tablet Take 1 tablet (40 mg total) by mouth daily. 30 tablet 11  . sertraline (ZOLOFT) 100 MG tablet Take 1.5 tablets (150 mg total) by mouth daily.  135 tablet 2  . simvastatin (ZOCOR) 20 MG tablet Take 1 tablet (20 mg total) by mouth every evening. 30 tablet 11  . spironolactone (ALDACTONE) 25 MG tablet TAKE 1 TABLET BY MOUTH EVERY DAY 30 tablet 5  . TOUJEO SOLOSTAR 300 UNIT/ML SOPN INJECT 90 UNITS INTO THE SKIN AT BEDTIME (6 BOXES) 27 pen 1  . TRAVATAN Z 0.004 % SOLN ophthalmic solution Place 1 drop into both eyes at bedtime.     Marland Kitchen zolpidem (AMBIEN) 10 MG tablet TAKE 1/2 TO 1 TABLET BY MOUTH AT BEDTIME AS NEEDED 30 tablet 3  . Semaglutide (OZEMPIC) 0.25 or 0.5 MG/DOSE SOPN Inject 0.5 mg into the skin once a week. (Patient not taking: Reported on 01/14/2018) 2 pen 5   No current facility-administered medications for this encounter.    Wt Readings from Last 3 Encounters:  01/14/18 120.3 kg (265 lb 3.2 oz)  12/15/17 119.7 kg (263 lb 12.8 oz)  09/12/17 119.7 kg (263 lb 12.8 oz)    PHYSICAL EXAM: Vitals:   01/14/18 1507  BP: (!) 150/92  Pulse: 64  SpO2: 97%  Weight: 120.3 kg (265 lb 3.2 oz)   General:  Tearful. No resp difficulty HEENT: normal Neck: supple. no JVD. Carotids 2+ bilat; no bruits. No lymphadenopathy or thryomegaly appreciated. Cor: PMI nondisplaced. Regular rate & rhythm. No rubs, gallops or murmurs. Lungs: clear Abdomen: obese,  soft, nontender, nondistended. No hepatosplenomegaly. No bruits or masses. Good bowel sounds. Extremities: no cyanosis, clubbing, rash, edema Neuro: alert & orientedx3, cranial nerves grossly intact. moves all 4 extremities w/o difficulty. Affect pleasant  ASSESSMENT & PLAN: 1. Chronic diastolic heart failure Dr Haroldine Laws reviewed previous echo and she has at least moderate PH. 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 Echo 08/2016 unable to visualize tricuspid valve, EF 60-65%.  Volume status stable. Continue torsemide as needed.  - Continue spiro 25 mg daily. I reviewed BMET from 10/3. Stable.   ECHO results pending.   2. Pulmonary HTN - As above. No change.  3.  Chest pain  - Previous cath 2006 and Myoview 2015 okay. 4. OSA  - Continue CPAP. She is compliant. 5. Obesity - Body mass index is 44.13 kg/m. 6. HTN -Elevated but she just had medications prior to the visit. Continue current regimen.  7. Probable PFO on TTE - This is physiologic and seen in 30% of people. Right side is normal in size. No embolic phenomenon. No further w/u needed.  8. S/p thyroidectomy -> Hypothyroidism on synthroid -Follow up with endocrinologist (Dr Cruzita Lederer) if abnormal. 9. Palpitations Results  of monitor discussed. Only 8% PVCs.     Follow up in 3 months. Referred to HFSW for coping strategies.  Darrick Grinder NP 3:18 PM

## 2018-01-14 NOTE — Progress Notes (Signed)
  Echocardiogram 2D Echocardiogram has been performed.  Madelaine Etienne 01/14/2018, 3:00 PM

## 2018-01-15 ENCOUNTER — Telehealth (HOSPITAL_COMMUNITY): Payer: Self-pay | Admitting: Vascular Surgery

## 2018-01-15 ENCOUNTER — Ambulatory Visit: Payer: BLUE CROSS/BLUE SHIELD | Admitting: Internal Medicine

## 2018-01-15 DIAGNOSIS — Z0289 Encounter for other administrative examinations: Secondary | ICD-10-CM

## 2018-01-15 NOTE — Telephone Encounter (Signed)
Left pt message to make 3 month f/u w/ NP/PA from 10/23 visit

## 2018-01-15 NOTE — Progress Notes (Deleted)
Patient ID: [name], female   DOB: 11-15-1965, 52 y.o.   MRN: [id number]  HPI: [name] is a 52 y.o.-year-old female, returning for f/u for DM2, dx 2008, insulin-dependent since ~2011, uncontrolled, with complications (peripheral neuropathy, dCHF), h/o papillary thyroid cancer and iatrogenic hypothyroidism. Last visit 4 months ago.  DM2: Last hemoglobin A1c was: Lab Results  Component Value Date   HGBA1C 7.5 (A) 09/12/2017   HGBA1C 7.7 05/23/2017   HGBA1C 7.4 07/01/2016    She is on: - Lantus solostar >> Toujeo 80 >> 90 units (2 injections) nightly - Metformin 1000 mg 2x a day - Invokana 300 mg in a.m. - Januvia 100 mg in a.m. >> Trulicity 1.5 mg weekly - started 08/2017 -no GI side effects She was also on Glipizide but developed hypoglycemia >> loss of consciousness. She was also on Amaryl >> taken off.   She checks her sugars during the day with a freestyle libre CGM-reviewed home meds: - am: 170-220 >> 140-160 >> 132-181 (some checks are after soda!) - before lunch: 116-163 >> n/c >> 115 - before dinner: 113 >> n/c >> 106, 156 - bedtime: n/c >> 160s >> 153, 159 Lowest sugar was 170 >> 120s >> ? Highest sugar was 275 >> 190 >> ?  Freestyle Libre CGM 14 day parameters: - average: 132 +/- 21.7% - coefficient of variation: 16.4% (19-25%) - time in range:  - low (<70): 0% - normal (70-180): 97% - high (>180): 3%  Pt's meals are: - Breakfast: skips - 10 o'clock: banana, apple, nectarine - Lunch: fast food - Dinner: goes out to eat around 8 o'clock: chicken sandwich or a wrap - Snacks: 0, no concentrated sweets  -No CKD, last BUN/creatinine:  Lab Results  Component Value Date   BUN 16 12/25/2017   CREATININE 0.66 12/25/2017  Not on an ACE inhibitor/ARB.  -+ HL; last set of lipids: Lab Results  Component Value Date   CHOL 154 08/01/2017   HDL 32.70 (L) 08/01/2017   LDLCALC 71 06/16/2015   LDLDIRECT 103.0 08/01/2017   TRIG 227.0 (H) 08/01/2017    CHOLHDL 5 08/01/2017  On simvastatin.  - last eye exam was in 06/2017: No DR, + glaucoma- on eyedrops - + Numbness and tingling in her feet.  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  She did not have to have RAI treatment.  Neck U/S (11/24/2014): Post thyroidectomy.  No suspicious lymphadenopathy or soft tissue.  Neck U/S (10/07/2017): Post thyroidectomy.  No abnormal masses.  Pt is on levothyroxine 175 mcg daily, taken: - daily - in am - fasting - at least 30 min from b'fast - no Ca,  MVI - + Fe at lunch - + PPIs at night - not on Biotin  Reviewed her TFTs: TSH fluctuating due to intermittent compliance with levothyroxine. Lab Results  Component Value Date   TSH 2.011 12/15/2017   TSH 6.32 (H) 08/01/2017   TSH 2.83 05/23/2017   TSH 14.73 (H) 07/26/2016   TSH 9.17 (A) 04/02/2016   TSH 11.83 (H) 06/16/2015   TSH 1.82 11/03/2014   TSH 8.756 (H) 03/24/2014   TSH 2.96 02/14/2014   TSH 29.85 (H) 12/20/2013   Pt denies: - feeling nodules in neck - hoarseness - choking - SOB with lying down She has chronic dysphagia.  ROS: Constitutional: no weight gain/no  weight loss, no fatigue, no subjective hyperthermia, no subjective hypothermia Eyes: no blurry vision, no xerophthalmia ENT: no sore throat, + see HPI Cardiovascular: no CP/no SOB/no palpitations/no leg swelling Respiratory: no cough/no SOB/no wheezing Gastrointestinal: no N/no V/no D/no C/no acid reflux Musculoskeletal: no muscle aches/no joint aches Skin: no rashes, no hair loss Neurological: no tremors/+ numbness/+ tingling/no dizziness  I reviewed pt's medications, allergies, PMH, social hx, family hx, and changes were documented in the history of present illness. Otherwise, unchanged from my initial visit note.  Past Medical History:   Diagnosis Date  . Allergy    SEASONAL  . Anemia    iron deficiency  . Anxiety   . Arthritis   . Asthma    a. 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  . Cellulitis and abscess of leg 02/2014  . CHF (congestive heart failure) ([place])   . Chronic kidney disease    /13  . Complication of anesthesia    DIFFICULT WAKING- 2011  . Depression    a. Stress reaction 08/2011 in multiple social stressors.  . Diabetes mellitus   . Dysrhythmia   . Gastritis   . GERD (gastroesophageal reflux disease)   . Heart murmur   . Hemorrhoid   . Hyperlipidemia   . Hypertension   . Hyperthyroidism   . Insomnia   . Morbid obesity ([place])   . OSA (obstructive sleep apnea) 01/17/2012  . Papillary thyroid carcinoma ([place])   . PCOS (polycystic ovarian syndrome)   . Sleep apnea    C PAP  . Vertigo    Past Surgical History:  Procedure Laterality Date  . CHOLECYSTECTOMY    . HEMORRHOID SURGERY    . INTRAUTERINE DEVICE INSERTION  2011  . SHOULDER SURGERY  06/2009  . THYROIDECTOMY  04/10/2012   Procedure: THYROIDECTOMY;  Surgeon: Jerrell Belfast, MD;  Location: [facility];  Service: ENT;  Laterality: N/A;  Total Thyroidectomy  . TONSILLECTOMY AND ADENOIDECTOMY  CHILD  . TUBAL LIGATION     Social History   Socioeconomic History  . Marital status: Married    Spouse name: Not on file  . Number of children: Not on file  . Years of education: Not on file  . Highest education level: Not on file  Occupational History  . Not on file  Social Needs  . Financial resource strain: Not on file  . Food insecurity:    Worry: Not on file    Inability: Not on file  . Transportation needs:    Medical: Not on file    Non-medical: Not on file  Tobacco Use  . Smoking status: Former Smoker    Packs/day: 0.20    Years: 5.00    Pack years: 1.00    Last attempt to quit: 03/25/2004    Years since quitting: 13.8  . Smokeless tobacco: Never Used  Substance and Sexual Activity  .  Alcohol use: No    Alcohol/week: 0.0 standard drinks  . Drug use: No  . Sexual activity: Yes  Lifestyle  . Physical activity:    Days per week: Not on file    Minutes per session: Not on file  . Stress: Not on file  Relationships  . Social connections:    Talks on phone: Not on file    Gets together: Not on file    Attends religious service: Not on file    Active member of club or organization: Not on file    Attends  meetings of clubs or organizations: Not on file    Relationship status: Not on file  . Intimate partner violence:    Fear of current or ex partner: Not on file    Emotionally abused: Not on file    Physically abused: Not on file    Forced sexual activity: Not on file  Other Topics Concern  . Not on file  Social History Narrative  . Not on file   Current Outpatient Medications on File Prior to Visit  Medication Sig Dispense Refill  . acetaminophen (TYLENOL) 325 MG tablet Take 1 tablet (325 mg total) by mouth every 4 (four) hours as needed (pain/fever).    Marland Kitchen albuterol (PROAIR HFA) 108 (90 BASE) MCG/ACT inhaler Inhale 2 puffs into the lungs every 4 (four) hours as needed. For wheezing 1 Inhaler 4  . buPROPion (WELLBUTRIN XL) 300 MG 24 hr tablet Take 1 tablet (300 mg total) by mouth daily. 90 tablet 1  . carvedilol (COREG) 3.125 MG tablet Take 1 tablet (3.125 mg total) by mouth 2 (two) times daily with a meal. 60 tablet 11  . cetirizine (ZYRTEC) 10 MG tablet Take 1 tablet (10 mg total) by mouth daily. 30 tablet 5  . ferrous sulfate 325 (65 FE) MG tablet Take 325 mg by mouth 2 (two) times daily.      . fluticasone (FLONASE) 50 MCG/ACT nasal spray Place 2 sprays into both nostrils daily as needed for allergies. 16 g 11  . glucose blood (ONE TOUCH ULTRA TEST) test strip Use 2x a day 100 each 2  . Insulin Pen Needle (B-D ULTRAFINE III SHORT PEN) 31G X 8 MM MISC USE ONCE A DAY 100 each 3  . INVOKANA 300 MG TABS tablet Take 1 tablet (300 mg total) by mouth daily before  breakfast. 30 tablet 11  . levothyroxine (SYNTHROID, LEVOTHROID) 175 MCG tablet TAKE 1 TABLET BY MOUTH EVERY DAY BEFORE BREAKFAST 90 tablet 3  . losartan (COZAAR) 100 MG tablet Take 1 tablet (100 mg total) by mouth daily. 30 tablet 3  . metFORMIN (GLUCOPHAGE) 1000 MG tablet TAKE 1 TABLET BY MOUTH TWICE A DAY WITH A MEAL 180 tablet 3  . pantoprazole (PROTONIX) 40 MG tablet Take 1 tablet (40 mg total) by mouth daily. 30 tablet 11  . Semaglutide (OZEMPIC) 0.25 or 0.5 MG/DOSE SOPN Inject 0.5 mg into the skin once a week. (Patient not taking: Reported on 01/14/2018) 2 pen 5  . sertraline (ZOLOFT) 100 MG tablet Take 1.5 tablets (150 mg total) by mouth daily. 135 tablet 2  . simvastatin (ZOCOR) 20 MG tablet Take 1 tablet (20 mg total) by mouth every evening. 30 tablet 11  . spironolactone (ALDACTONE) 25 MG tablet TAKE 1 TABLET BY MOUTH EVERY DAY 30 tablet 5  . TOUJEO SOLOSTAR 300 UNIT/ML SOPN INJECT 90 UNITS INTO THE SKIN AT BEDTIME (6 BOXES) 27 pen 1  . TRAVATAN Z 0.004 % SOLN ophthalmic solution Place 1 drop into both eyes at bedtime.     Marland Kitchen zolpidem (AMBIEN) 10 MG tablet TAKE 1/2 TO 1 TABLET BY MOUTH AT BEDTIME AS NEEDED 30 tablet 3   No current facility-administered medications on file prior to visit.    Allergies  Allergen Reactions  . Tape Rash    Plastic tape  . Buspirone Hcl Nausea Only  . Glipizide     REACTION: sleepy, low sugar  . Ivp Dye [Iodinated Diagnostic Agents]     Shut kidneys down  . Oxycodone-Acetaminophen Itching  Family History  Problem Relation Age of Onset  . Cancer Father        colon cancer  . Arthritis Father   . Colon cancer Father   . Colon polyps Father   . Arthritis Mother   . Asthma Mother   . Hypertension Mother   . Ovarian cancer Maternal Aunt   . Diabetes Paternal Aunt   . Diabetes Paternal Uncle   . Heart disease Maternal Grandfather     PE: There were no vitals taken for this visit. Wt Readings from Last 3 Encounters:  01/14/18 265 lb 3.2 oz  (120.3 kg)  12/15/17 263 lb 12.8 oz (119.7 kg)  09/12/17 263 lb 12.8 oz (119.7 kg)   Constitutional: overweight, in NAD Eyes: PERRLA, EOMI, no exophthalmos ENT: moist mucous membranes, no masses palpated in the neck, no cervical lymphadenopathy Cardiovascular: RRR, No RG, +2/6 SEM Respiratory: CTA B Gastrointestinal: abdomen soft, NT, ND, BS+ Musculoskeletal: no deformities, strength intact in all 4 Skin: moist, warm, no rashes Neurological: no tremor with outstretched hands, DTR normal in all 4  ASSESSMENT: 1. DM2, insulin-dependent, uncontrolled, without complications  2. Iatrogenic Hypothyroidism - h/o med noncompliance  3. [place] -No RAI treatment was necessary  PLAN:  1. Patient with history of uncontrolled diabetes, on basal insulin regimen and oral antidiabetic regimen, with improvement of her sugars after adding Invokana and Januvia but latest A1c increased to 7.7%.  Was not checking sugars at all.  I strongly advised her to start.  We did discuss at that point about the possibility of switching from Januvia to Trulicity but she wanted to work on her diet and activity level first, before adding this medication. - at this visit, sugars are maybe a little better - she has some high CBGs after sodas >> advised to stop regular sodas - however, as sugars are still high >> will change from Januvia to Ozempic >> explained expected benefits and possible SEs.will start at a low dose and advised as tolerated. - I suggested to:  Patient Instructions  Please continue: - Toujeo 90 units (45 units x 2 injections) at night - Metformin 1000 mg 2x a day - Invokana 300 mg in a.m. - Ozempic 0.5 mg weekly  Please continue Levothyroxine 175 mcg daily.  Take the thyroid hormone every day, with water, at least 30 minutes before breakfast, separated by at least 4 hours from: - acid reflux medications - calcium - iron - multivitamins  Please return in 3-4 months with your  sugar log.   - today, HbA1c is 7%  - continue checking sugars at different times of the day - check 1x a day, rotating checks - advised for yearly eye exams >> she is UTD - Return to clinic in 3-4 mo with sugar log   2. Iatrogenic Hypothyroidism - At last visit, she was missing levothyroxine doses.  I strongly advised her to take this every day  - latest thyroid labs reviewed with pt >> normal last month - she continues on LT4 175 Mcg daily - pt feels good on this dose. - we discussed about taking the thyroid hormone every day, with water, >30 minutes before breakfast, separated by >4 hours from acid reflux medications, calcium, iron, multivitamins. Pt. is taking it correctly.  3. Follicular PTC -Reviewed latest neck ultrasound from 10/07/2017: No abnormal masses -No neck compression symptoms except persistent mild dysphagia  Philemon Kingdom, MD PhD [facility]

## 2018-03-23 ENCOUNTER — Other Ambulatory Visit: Payer: Self-pay

## 2018-03-23 ENCOUNTER — Other Ambulatory Visit (HOSPITAL_COMMUNITY): Payer: Self-pay

## 2018-03-23 MED ORDER — INSULIN GLARGINE (1 UNIT DIAL) 300 UNIT/ML ~~LOC~~ SOPN: 90.0000 [IU] | PEN_INJECTOR | Freq: Every day | SUBCUTANEOUS | 1 refills | Status: DC

## 2018-03-23 MED ORDER — LOSARTAN POTASSIUM 100 MG PO TABS: 100.0000 mg | ORAL_TABLET | Freq: Every day | ORAL | 3 refills | Status: DC

## 2018-04-21 ENCOUNTER — Encounter (HOSPITAL_COMMUNITY): Payer: No Typology Code available for payment source

## 2018-05-07 ENCOUNTER — Encounter (HOSPITAL_COMMUNITY): Payer: No Typology Code available for payment source

## 2018-05-18 ENCOUNTER — Encounter: Payer: Self-pay | Admitting: Family Medicine

## 2018-05-18 MED ORDER — ALBUTEROL SULFATE 108 (90 BASE) MCG/ACT IN AEPB: 2.0000 | INHALATION_SPRAY | Freq: Four times a day (QID) | RESPIRATORY_TRACT | 1 refills | Status: DC | PRN

## 2018-06-01 ENCOUNTER — Other Ambulatory Visit: Payer: Self-pay | Admitting: Family Medicine

## 2018-06-02 ENCOUNTER — Other Ambulatory Visit: Payer: Self-pay | Admitting: Internal Medicine

## 2018-06-02 NOTE — Telephone Encounter (Signed)
No recent or future appt. Ambien last filled on 12/12/17 #30 tabs with 3 refills, please advise

## 2018-06-11 ENCOUNTER — Encounter (HOSPITAL_COMMUNITY): Payer: No Typology Code available for payment source

## 2018-06-19 ENCOUNTER — Other Ambulatory Visit: Payer: Self-pay

## 2018-06-19 ENCOUNTER — Ambulatory Visit (HOSPITAL_COMMUNITY)
Admission: RE | Admit: 2018-06-19 | Discharge: 2018-06-19 | Disposition: A | Discharge status: Home / Self Care | Payer: No Typology Code available for payment source | Source: Ambulatory Visit | Attending: Cardiology | Admitting: Cardiology

## 2018-06-26 ENCOUNTER — Other Ambulatory Visit: Payer: Self-pay

## 2018-06-26 ENCOUNTER — Encounter (HOSPITAL_COMMUNITY): Payer: Self-pay

## 2018-06-26 ENCOUNTER — Ambulatory Visit (HOSPITAL_COMMUNITY)
Admission: RE | Admit: 2018-06-26 | Discharge: 2018-06-26 | Disposition: A | Discharge status: Home / Self Care | Payer: No Typology Code available for payment source | Source: Ambulatory Visit | Attending: Cardiology | Admitting: Cardiology

## 2018-06-26 DIAGNOSIS — R42 Dizziness and giddiness: Secondary | ICD-10-CM

## 2018-06-26 DIAGNOSIS — I5032 Chronic diastolic (congestive) heart failure: Secondary | ICD-10-CM | POA: Diagnosis not present

## 2018-06-26 DIAGNOSIS — G4733 Obstructive sleep apnea (adult) (pediatric): Secondary | ICD-10-CM

## 2018-06-26 DIAGNOSIS — I110 Hypertensive heart disease with heart failure: Secondary | ICD-10-CM

## 2018-06-26 DIAGNOSIS — Z9989 Dependence on other enabling machines and devices: Secondary | ICD-10-CM

## 2018-06-26 DIAGNOSIS — I10 Essential (primary) hypertension: Secondary | ICD-10-CM

## 2018-06-26 DIAGNOSIS — I272 Pulmonary hypertension, unspecified: Secondary | ICD-10-CM | POA: Diagnosis not present

## 2018-06-26 DIAGNOSIS — R079 Chest pain, unspecified: Secondary | ICD-10-CM | POA: Diagnosis not present

## 2018-06-26 DIAGNOSIS — R002 Palpitations: Secondary | ICD-10-CM

## 2018-06-26 MED ORDER — CARVEDILOL 3.125 MG PO TABS: 3.1250 mg | ORAL_TABLET | Freq: Two times a day (BID) | ORAL | 6 refills | Status: DC

## 2018-06-26 MED ORDER — LOSARTAN POTASSIUM 100 MG PO TABS: 100.0000 mg | ORAL_TABLET | Freq: Every day | ORAL | 6 refills | Status: DC

## 2018-06-26 MED ORDER — TORSEMIDE 20 MG PO TABS: 20.0000 mg | ORAL_TABLET | Freq: Every day | ORAL | 6 refills | Status: AC | PRN

## 2018-06-26 NOTE — Progress Notes (Addendum)
Called patient to discuss after call summary. Verbalized understanding of all information. Rx for BMET faxed to [facility].  Message with summary sent via mychart. Pt appreciative.

## 2018-06-26 NOTE — Progress Notes (Signed)
Heart Failure TeleHealth Note  Due to national recommendations of social distancing due to [place] 19, telehealth visit is felt to be most appropriate for this patient at this time.  I discussed the limitations, risks, security and privacy concerns of performing an evaluation and management service by telephone and the availability of in person appointments. I also discussed with the patient that there may be a patient responsible charge related to this service. The patient expressed understanding and agreed to proceed.   ID:  [name], DOB 1965/09/04, MRN [id number]  Location: Home  Provider location: [address], [place] Type of Visit: Established patient  PCP:  [name], MD  Primary HF: Dr Haroldine Laws  Chief Complaint: Diastolic HF   History of Present Illness: [name] is a 53 y.o. female with hx of DM, HTN, HL, morbid obesity, OSA noncompliant with CPAP due to finances, anxiety, diastolic HF with secondary pulmonary HTN and noncardiac CP. Cath 2006 for CP with only catheter-induced spasm of RCA otherwise normal coronaries. EF was 65% at that time. Developed acute renal failure after CT contrast.   Echo 12/2017: EF 60-65%, grade 2 DD, RV mildly dilated, LA moderately dilated, no TR  [name] is a 53 y.o. female who presents via audio conferencing for a telehealth visit today. Overall doing okay. She has some SOB with walking, but does okay if she goes slow. Denies edema, orthopnea, or PND. Wearing CPAP qHS. Has ongoing palpitations, but they are less severe. Occurs when she is stressed. +dizziness and unsteadiness with sitting, standing, walking, and moving head side to side. This has been going on since Christmas. No cough or fever. Occasional chills. She is stressed because her hours have been cut back due to COVID-19. 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 Takes torsemide with potassium PRN. Usually about twice/month when legs  swell. Weighing weekly, weights ~268 lbs. Taking all medications. Her work is Chief of Staff daily, but is not practicing social distancing. Advised her to do this. Home BP cuff isn't working, but she will check her BP at work.   Weight: 268 lbs  She denies symptoms of cough, fevers, chills, or new SOB worrisome for COVID 19. No sick contacts.   Past Medical History:  Diagnosis Date  . Allergy    SEASONAL  . Anemia    iron deficiency  . Anxiety   . Arthritis   . Asthma    a. 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  . Cellulitis and abscess of leg 02/2014  . CHF (congestive heart failure) ([place])   . Chronic kidney disease    /13  . Complication of anesthesia    DIFFICULT WAKING- 2011  . Depression    a. Stress reaction 08/2011 in multiple social stressors.  . Diabetes mellitus   . Dysrhythmia   . Gastritis   . GERD (gastroesophageal reflux disease)   . Heart murmur   . Hemorrhoid   . Hyperlipidemia   . Hypertension   . Hyperthyroidism   . Insomnia   . Morbid obesity ([place])   . OSA (obstructive sleep apnea) 01/17/2012  . Papillary thyroid carcinoma ([place])   . PCOS (polycystic ovarian syndrome)   . Sleep apnea    C PAP  . Vertigo    Past Surgical History:  Procedure Laterality Date  . CHOLECYSTECTOMY    . HEMORRHOID SURGERY    . INTRAUTERINE DEVICE INSERTION  2011  . SHOULDER SURGERY  06/2009  . THYROIDECTOMY  04/10/2012   Procedure: THYROIDECTOMY;  Surgeon: Jerrell Belfast, MD;  Location: [facility];  Service: ENT;  Laterality: N/A;  Total Thyroidectomy  . TONSILLECTOMY AND ADENOIDECTOMY  CHILD  . TUBAL LIGATION       Current Outpatient Medications  Medication Sig Dispense Refill  . carvedilol (COREG) 3.125 MG tablet Take 1 tablet (3.125 mg total) by mouth 2 (two) times daily with a meal. 60 tablet 11  . losartan (COZAAR) 100 MG tablet Take 1 tablet (100 mg total) by mouth daily. 30 tablet 3  . spironolactone (ALDACTONE) 25 MG tablet  TAKE 1 TABLET BY MOUTH EVERY DAY 30 tablet 5  . torsemide (DEMADEX) 20 MG tablet Take 20 mg by mouth daily as needed.    Marland Kitchen acetaminophen (TYLENOL) 325 MG tablet Take 1 tablet (325 mg total) by mouth every 4 (four) hours as needed (pain/fever).    . Albuterol Sulfate (PROAIR RESPICLICK) 017 (90 Base) MCG/ACT AEPB Inhale 2 puffs into the lungs every 6 (six) hours as needed for up to 6 doses. 1 each 1  . buPROPion (WELLBUTRIN XL) 300 MG 24 hr tablet Take 1 tablet (300 mg total) by mouth daily. 90 tablet 1  . cetirizine (ZYRTEC) 10 MG tablet TAKE 1 TABLET BY MOUTH EVERY DAY 30 tablet 5  . Continuous Blood Gluc Sensor (FREESTYLE LIBRE 14 DAY SENSOR) MISC CHANGE EVERY 14 DAYS 2 each 11  . ferrous sulfate 325 (65 FE) MG tablet Take 325 mg by mouth 2 (two) times daily.      . fluticasone (FLONASE) 50 MCG/ACT nasal spray Place 2 sprays into both nostrils daily as needed for allergies. 16 g 11  . glucose blood (ONE TOUCH ULTRA TEST) test strip Use 2x a day 100 each 2  . Insulin Glargine, 1 Unit Dial, (TOUJEO SOLOSTAR) 300 UNIT/ML SOPN Inject 90 Units into the skin at bedtime. 27 pen 1  . Insulin Pen Needle (B-D ULTRAFINE III SHORT PEN) 31G X 8 MM MISC USE ONCE A DAY 100 each 3  . INVOKANA 300 MG TABS tablet Take 1 tablet (300 mg total) by mouth daily before breakfast. 30 tablet 11  . levothyroxine (SYNTHROID, LEVOTHROID) 175 MCG tablet TAKE 1 TABLET BY MOUTH EVERY DAY BEFORE BREAKFAST 90 tablet 3  . metFORMIN (GLUCOPHAGE) 1000 MG tablet TAKE 1 TABLET BY MOUTH TWICE A DAY WITH A MEAL 180 tablet 3  . pantoprazole (PROTONIX) 40 MG tablet Take 1 tablet (40 mg total) by mouth daily. 30 tablet 11  . Semaglutide (OZEMPIC) 0.25 or 0.5 MG/DOSE SOPN Inject 0.5 mg into the skin once a week. (Patient not taking: Reported on 01/14/2018) 2 pen 5  . sertraline (ZOLOFT) 100 MG tablet Take 1.5 tablets (150 mg total) by mouth daily. 135 tablet 2  . simvastatin (ZOCOR) 20 MG tablet Take 1 tablet (20 mg total) by mouth every  evening. 30 tablet 11  . TRAVATAN Z 0.004 % SOLN ophthalmic solution Place 1 drop into both eyes at bedtime.     Marland Kitchen zolpidem (AMBIEN) 10 MG tablet TAKE 1/2 TO 1 TABLET BY MOUTH AT BEDTIME AS NEEDED 30 tablet 3   No current facility-administered medications for this encounter.     Allergies:   Tape; Buspirone hcl; Glipizide; Ivp dye [iodinated diagnostic agents]; and Oxycodone-acetaminophen   Social History:  The patient  reports that she quit smoking about 14 years ago. She has a 1.00 pack-year smoking history. She has never  used smokeless tobacco. She reports that she does not drink alcohol or use drugs.   Family History:  The patient's family history includes Arthritis in her father and mother; Asthma in her mother; Cancer in her father; Colon cancer in her father; Colon polyps in her father; Diabetes in her paternal aunt and paternal uncle; Heart disease in her maternal grandfather; Hypertension in her mother; Ovarian cancer in her maternal aunt.   ROS:  Please see the history of present illness.   All other systems are personally reviewed and negative.   Exam:  [facility] Call) Lungs: Normal respiratory effort with conversation.  Neuro: Alert & oriented x 3.  Denies edema  Recent Labs: 08/01/2017: ALT 13; Hemoglobin 13.9; Platelets 240.0 12/15/2017: B Natriuretic Peptide 109.7; TSH 2.011 12/25/2017: BUN 16; Creatinine, Ser 0.66; Magnesium 2.0; Potassium 4.8; Sodium 139  Personally reviewed   Wt Readings from Last 3 Encounters:  01/14/18 120.3 kg (265 lb 3.2 oz)  12/15/17 119.7 kg (263 lb 12.8 oz)  09/12/17 119.7 kg (263 lb 12.8 oz)     ASSESSMENT AND PLAN:  1. Chronic diastolic HF - Echo 43/1540: EF 60-65%, grade 2 DD, RV mildly dilated, LA moderately dilated, no TR - NYHA II-III. Volume sounds stable. Takes torsemide PRN - Continue spiro 25 mg daily. Check BMET.   2. Pulmonary HTN - Dr Haroldine Laws reviewed previous echo and she has at least moderate PH. (likely WHO Group II  and III) - Echo 12/2017 with normal pulmonary pressures. No change    3. Chest pain - Cath 2006 and myoview 2015 okay.  - None recent  4. OSA - Continue CPAP. She is compliant  5. HTN - Continue losartan and coreg - She will check her BP at work next week. I asked her to call us back if SBP >140 on multiple checks.   6. Probable PFO on TTE - This is physiologic and seen in 30% of people. Right side is normal in size. No embolic phenomenon. No further w/u needed.   7. Palpitations - Only 8% PVCs on Ziopatch 12/2017.  - Continues to have palpitations, but aren't as severe.  8. Dizziness  - Sounds like vertigo. I told her she could try OTC meclizine to see if it helps. She should follow up with PCP.     COVID screen The patient does not have any symptoms that suggest any further testing/ screening at this time.  Social distancing reinforced today. No food insecurity  Relevant cardiac medications were reviewed at length with the patient today.  The patient does not have concerns regarding their medications at this time.   Recommended follow-up: Follow up in 4 months  The following changes were made today: Provided refills on losartan, coreg, and torsemide. She would like 30 day supplies.   Labs/ tests ordered today include: Check BMET at [facility] on [address] next Thursday.  Patient Risk: After full review of this patients clinical status, I feel that they are at moderate risk for cardiac decompensation at this time.  Today, I have spent 26 minutes with the patient with telehealth technology discussing heart failure, weights, blood pressure goals, medications, and COVID.    Signed, Georgiana Shore, NP  06/26/2018 12:38 PM  [facility] [address] [facility] and [place] [address] [phone] (office) [phone] (fax)

## 2018-06-26 NOTE — Addendum Note (Signed)
Encounter addended by: Valeda Malm, RN on: 06/26/2018 1:40 PM  Actions taken: Order list changed, Clinical Note Signed

## 2018-06-26 NOTE — Addendum Note (Signed)
Encounter addended by: Valeda Malm, RN on: 06/26/2018 2:03 PM  Actions taken: Clinical Note Signed

## 2018-06-26 NOTE — Patient Instructions (Addendum)
Refills sent to the pharmacy for Coreg, Losartan and Torsemide for 30 day supply  Labs requested for next Thursday 07/02/18 at [facility].  If you have any challenges getting this done, please call us at [phone] opt 2 for nurses.   Labs should be faxed to [phone]. We will only contact you if something comes back abnormal or we need to make some changes. Otherwise no news is good news!  Your physician recommends that you schedule a follow-up appointment in: 4 months with Dr. Aundra Dubin, you will get a call to schedule this appointment. If you do not get a call by July, please call us to schedule this appointment.

## 2018-07-09 ENCOUNTER — Other Ambulatory Visit: Payer: Self-pay

## 2018-07-09 NOTE — Addendum Note (Signed)
Encounter addended by: Scarlette Calico, RN on: 07/09/2018 1:44 PM  Actions taken: Order list changed, Diagnosis association updated

## 2018-07-10 LAB — BASIC METABOLIC PANEL
BUN/Creatinine Ratio: 20 (ref 9–23)
BUN: 16 mg/dL (ref 6–24)
CO2: 27 mmol/L (ref 20–29)
Calcium: 9.8 mg/dL (ref 8.7–10.2)
Chloride: 96 mmol/L (ref 96–106)
Creatinine, Ser: 0.81 mg/dL (ref 0.57–1.00)
GFR calc Af Amer: 97 mL/min/{1.73_m2} (ref >59)
GFR calc non Af Amer: 84 mL/min/{1.73_m2} (ref >59)
Glucose: 213 mg/dL — ABNORMAL HIGH (ref 65–99)
Potassium: 4.8 mmol/L (ref 3.5–5.2)
Sodium: 138 mmol/L (ref 134–144)

## 2018-07-14 ENCOUNTER — Other Ambulatory Visit (HOSPITAL_COMMUNITY): Payer: Self-pay | Admitting: Cardiology

## 2018-07-14 NOTE — Addendum Note (Signed)
Encounter addended by: Georgiana Shore, NP on: 07/14/2018 3:20 PM  Actions taken: Delete clinical note

## 2018-07-29 ENCOUNTER — Encounter (HOSPITAL_COMMUNITY): Payer: No Typology Code available for payment source

## 2018-07-29 ENCOUNTER — Encounter: Payer: Self-pay | Admitting: Family Medicine

## 2018-07-29 ENCOUNTER — Other Ambulatory Visit: Payer: Self-pay | Admitting: Family Medicine

## 2018-07-29 MED ORDER — SERTRALINE HCL 100 MG PO TABS: 150.0000 mg | ORAL_TABLET | Freq: Every day | ORAL | 0 refills | Status: DC

## 2018-08-10 ENCOUNTER — Ambulatory Visit (INDEPENDENT_AMBULATORY_CARE_PROVIDER_SITE_OTHER): Payer: PRIVATE HEALTH INSURANCE | Admitting: Family Medicine

## 2018-08-10 ENCOUNTER — Encounter: Payer: Self-pay | Admitting: Family Medicine

## 2018-08-10 DIAGNOSIS — C73 Malignant neoplasm of thyroid gland: Secondary | ICD-10-CM

## 2018-08-10 DIAGNOSIS — I10 Essential (primary) hypertension: Secondary | ICD-10-CM

## 2018-08-10 DIAGNOSIS — R29898 Other symptoms and signs involving the musculoskeletal system: Secondary | ICD-10-CM

## 2018-08-10 DIAGNOSIS — E249 Cushing's syndrome, unspecified: Secondary | ICD-10-CM

## 2018-08-10 DIAGNOSIS — E1142 Type 2 diabetes mellitus with diabetic polyneuropathy: Secondary | ICD-10-CM | POA: Diagnosis not present

## 2018-08-10 DIAGNOSIS — IMO0002 Reserved for concepts with insufficient information to code with codable children: Secondary | ICD-10-CM

## 2018-08-10 DIAGNOSIS — E890 Postprocedural hypothyroidism: Secondary | ICD-10-CM | POA: Diagnosis not present

## 2018-08-10 DIAGNOSIS — E782 Mixed hyperlipidemia: Secondary | ICD-10-CM

## 2018-08-10 DIAGNOSIS — E1165 Type 2 diabetes mellitus with hyperglycemia: Secondary | ICD-10-CM

## 2018-08-10 DIAGNOSIS — G4709 Other insomnia: Secondary | ICD-10-CM

## 2018-08-10 DIAGNOSIS — F418 Other specified anxiety disorders: Secondary | ICD-10-CM

## 2018-08-10 NOTE — Progress Notes (Signed)
Virtual Visit via Video Note  I connected with [name] on 08/10/18 at  2:00 PM EDT by a video enabled telemedicine application and verified that I am speaking with the correct person using two identifiers.  Location: Patient: home Provider: office   I discussed the limitations of evaluation and management by telemedicine and the availability of in person appointments. The patient expressed understanding and agreed to proceed.  History of Present Illness: Pt presents for f/u of chronic health problems   Her hours were cut at work/pandemic Off today    Feels ok overall  Runny nose and itchy eyes from allergies   Low back hurts off and on  occ "shock waves" to her head   Had short lived n/v/d last week- viral That is done  Quite stressed - can't visit mother due to the pandemic   Wt Readings from Last 3 Encounters:  01/14/18 265 lb 3.2 oz (120.3 kg)  12/15/17 263 lb 12.8 oz (119.7 kg)  09/12/17 263 lb 12.8 oz (119.7 kg)  does not weigh at home Does not feel different   HTN In setting of h/o CHF/ diastolic No cp or palpitations or headaches or edema  No side effects to medicines  BP Readings from Last 3 Encounters:  01/14/18 (!) 150/92  12/15/17 (!) 160/86  09/12/17 140/86    Takes carvedilol and losartan and spironolactone   did a check at work 151/80s last week when she was not feeling well   Virtual visit cardiology last month Still has some palpitations -when she is stressed  No heart failure symptoms   Cushing's syndrome-followed by endocrinology-Dr Cruzita Lederer  Also DM2 Post surgical hypothyroid (h/o thyroid cancer) Overdue for a f/u appt   Her blood glucose is "all over the place"  Eating at home more (but she is a [employer]) -she does eat a lot of carbs  Mostly high- 200s  (one day was 400)  Other times 140s/160s  Eating more oranges/OJ lately - craves them - too much sugar intake   Exercise -none right now  Her low back and legs hurt /legs  feel heavy  Numbness Mental clouding  She is worried about MS- her mother had it  Would like to see a neurologist   Notes she is hot and cold all the time and also tired - ? Thyroid    Last eye exam-has one upcoming soon    Hyperlipidemia Lab Results  Component Value Date   CHOL 154 08/01/2017   HDL 32.70 (L) 08/01/2017   LDLCALC 71 06/16/2015   LDLDIRECT 103.0 08/01/2017   TRIG 227.0 (H) 08/01/2017   CHOLHDL 5 08/01/2017   Taking simvastatin 20 mg daily   Anxiety/depression  Taking wellbutrin xl 300 Also zoloft 150 mg  Anxiety - increase in stress /tearful occasionally Still working well however and wants to continue both medications   Insomnia/insetting of OSA Sleep pattern is "off"  Is sleeping very late  [place] helps her get to sleep - takes 10 on the weekend , 5 mg on week days   Review of Systems  Constitutional: Positive for malaise/fatigue. Negative for chills, diaphoresis, fever and weight loss.  HENT: Negative for hearing loss and sore throat.   Eyes: Negative for blurred vision.  Respiratory: Negative for cough and shortness of breath.   Cardiovascular: Negative for chest pain, palpitations, claudication and leg swelling.  Gastrointestinal: Negative for heartburn, nausea and vomiting.  Genitourinary: Negative for urgency.  Musculoskeletal: Positive for back pain and  myalgias.  Skin: Negative for rash.  Neurological: Positive for tingling and headaches. Negative for dizziness, tremors, sensory change and speech change.       Feels like both legs are equally weak No falls   Endo/Heme/Allergies: Positive for environmental allergies. Negative for polydipsia.  Psychiatric/Behavioral: Positive for depression. Negative for memory loss and suicidal ideas. The patient is nervous/anxious and has insomnia.      Patient Active Problem List   Diagnosis Date Noted  . Leg weakness, bilateral 08/10/2018  . Routine general medical examination at a health care facility  12/19/2013  . Palpitations 02/03/2013  . Nonspecific abnormal toxicology 10/29/2012  . 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 Papillary thyroid carcinoma ([place]) 04/27/2012  . Cushing's syndrome ([place]) 04/27/2012  . Chronic diastolic heart failure ([place]) 02/10/2012  . Diastolic CHF ([place]) 27/25/3664  . OSA (obstructive sleep apnea) just got cpap at home this past week 01/17/12 01/17/2012  . CHF (congestive heart failure) ([place]) 01/16/2012  . Chronic allergic rhinitis 10/26/2010  . Insomnia 10/26/2010  . OBESITY, MORBID 04/27/2010  . HEMORRHOIDS, INTERNAL, WITH BLEEDING 08/17/2009  . GERD 04/27/2008  . Essential hypertension 11/03/2007  . HYPERLIPIDEMIA 05/14/2007  . OTHER ORGANIC SLEEP DISORDERS 03/09/2007  . Uncontrolled type 2 diabetes mellitus with peripheral neuropathy ([place]) 09/11/2006  . POLYCYSTIC OVARIES 09/11/2006  . Iron deficiency anemia 09/11/2006  . Depression with anxiety 09/11/2006  . EXTERNAL HEMORRHOIDS 09/11/2006  . Asthma 09/11/2006   Past Medical History:  Diagnosis Date  . Allergy    SEASONAL  . Anemia    iron deficiency  . Anxiety   . Arthritis   . Asthma    a. 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  . Cellulitis and abscess of leg 02/2014  . CHF (congestive heart failure) ([place])   . Chronic kidney disease    /13  . Complication of anesthesia    DIFFICULT WAKING- 2011  . Depression    a. Stress reaction 08/2011 in multiple social stressors.  . Diabetes mellitus   . Dysrhythmia   . Gastritis   . GERD (gastroesophageal reflux disease)   . Heart murmur   . Hemorrhoid   . Hyperlipidemia   . Hypertension   . Hyperthyroidism   . Insomnia   . Morbid obesity ([place])   . OSA (obstructive sleep apnea) 01/17/2012  . Papillary thyroid carcinoma ([place])   . PCOS (polycystic ovarian syndrome)   . Sleep apnea    C PAP  . Vertigo    Past Surgical History:  Procedure Laterality Date  . CHOLECYSTECTOMY    . HEMORRHOID  SURGERY    . INTRAUTERINE DEVICE INSERTION  2011  . SHOULDER SURGERY  06/2009  . THYROIDECTOMY  04/10/2012   Procedure: THYROIDECTOMY;  Surgeon: Jerrell Belfast, MD;  Location: [facility];  Service: ENT;  Laterality: N/A;  Total Thyroidectomy  . TONSILLECTOMY AND ADENOIDECTOMY  CHILD  . TUBAL LIGATION     Social History   Tobacco Use  . Smoking status: Former Smoker    Packs/day: 0.20    Years: 5.00    Pack years: 1.00    Last attempt to quit: 03/25/2004    Years since quitting: 14.3  . Smokeless tobacco: Never Used  Substance Use Topics  . Alcohol use: No    Alcohol/week: 0.0 standard drinks  . Drug use: No   Family History  Problem Relation Age of Onset  . Cancer Father        colon cancer  .  Arthritis Father   . Colon cancer Father   . Colon polyps Father   . Arthritis Mother   . Asthma Mother   . Hypertension Mother   . Ovarian cancer Maternal Aunt   . Diabetes Paternal Aunt   . Diabetes Paternal Uncle   . Heart disease Maternal Grandfather    Allergies  Allergen Reactions  . Tape Rash    Plastic tape  . Buspirone Hcl Nausea Only  . Glipizide     REACTION: sleepy, low sugar  . Ivp Dye [Iodinated Diagnostic Agents]     Shut kidneys down  . Oxycodone-Acetaminophen Itching   Current Outpatient Medications on File Prior to Visit  Medication Sig Dispense Refill  . acetaminophen (TYLENOL) 325 MG tablet Take 1 tablet (325 mg total) by mouth every 4 (four) hours as needed (pain/fever).    . Albuterol Sulfate (PROAIR RESPICLICK) 161 (90 Base) MCG/ACT AEPB Inhale 2 puffs into the lungs every 6 (six) hours as needed for up to 6 doses. 1 each 1  . buPROPion (WELLBUTRIN XL) 300 MG 24 hr tablet Take 1 tablet (300 mg total) by mouth daily. 90 tablet 1  . carvedilol (COREG) 3.125 MG tablet Take 1 tablet (3.125 mg total) by mouth 2 (two) times daily with a meal. 60 tablet 6  . cetirizine (ZYRTEC) 10 MG tablet TAKE 1 TABLET BY MOUTH EVERY DAY 30 tablet 5  . Continuous Blood Gluc  Sensor (FREESTYLE LIBRE 14 DAY SENSOR) MISC CHANGE EVERY 14 DAYS 2 each 11  . ferrous sulfate 325 (65 FE) MG tablet Take 325 mg by mouth 2 (two) times daily.      . fluticasone (FLONASE) 50 MCG/ACT nasal spray Place 2 sprays into both nostrils daily as needed for allergies. 16 g 11  . glucose blood (ONE TOUCH ULTRA TEST) test strip Use 2x a day 100 each 2  . Insulin Glargine, 1 Unit Dial, (TOUJEO SOLOSTAR) 300 UNIT/ML SOPN Inject 90 Units into the skin at bedtime. 27 pen 1  . Insulin Pen Needle (B-D ULTRAFINE III SHORT PEN) 31G X 8 MM MISC USE ONCE A DAY 100 each 3  . INVOKANA 300 MG TABS tablet Take 1 tablet (300 mg total) by mouth daily before breakfast. 30 tablet 11  . levothyroxine (SYNTHROID, LEVOTHROID) 175 MCG tablet TAKE 1 TABLET BY MOUTH EVERY DAY BEFORE BREAKFAST 90 tablet 3  . losartan (COZAAR) 100 MG tablet Take 1 tablet (100 mg total) by mouth daily. 30 tablet 6  . metFORMIN (GLUCOPHAGE) 1000 MG tablet TAKE 1 TABLET BY MOUTH TWICE A DAY WITH A MEAL 180 tablet 3  . pantoprazole (PROTONIX) 40 MG tablet Take 1 tablet (40 mg total) by mouth daily. 30 tablet 11  . Semaglutide (OZEMPIC) 0.25 or 0.5 MG/DOSE SOPN Inject 0.5 mg into the skin once a week. (Patient not taking: Reported on 01/14/2018) 2 pen 5  . sertraline (ZOLOFT) 100 MG tablet Take 1.5 tablets (150 mg total) by mouth daily. 135 tablet 0  . simvastatin (ZOCOR) 20 MG tablet Take 1 tablet (20 mg total) by mouth every evening. 30 tablet 11  . spironolactone (ALDACTONE) 25 MG tablet TAKE 1 TABLET BY MOUTH EVERY DAY 30 tablet 5  . torsemide (DEMADEX) 20 MG tablet Take 1 tablet (20 mg total) by mouth daily as needed. 30 tablet 6  . TRAVATAN Z 0.004 % SOLN ophthalmic solution Place 1 drop into both eyes at bedtime.     Marland Kitchen zolpidem (AMBIEN) 10 MG tablet TAKE 1/2 TO  1 TABLET BY MOUTH AT BEDTIME AS NEEDED 30 tablet 3   No current facility-administered medications on file prior to visit.     Observations/Objective: Patient appears well,  in no distress Weight is baseline (obese) No facial swelling or asymmetry Normal voice-not hoarse and no slurred speech No obvious tremor or mobility impairment Moving neck and UEs normally Able to hear the call well  No cough or shortness of breath during interview  Talkative and mentally sharp with no cognitive changes No skin changes on face or neck , no rash or pallor Affect is normal    Assessment and Plan: Problem List Items Addressed This Visit      Cardiovascular and Mediastinum   Essential hypertension - Primary    bp is up  BP Readings from Last 1 Encounters:  01/14/18 (!) 150/92  we will plan a drive through BP check Most recent labs reviewed  Disc lifstyle change with low sodium diet and exercise          Endocrine   Uncontrolled type 2 diabetes mellitus with peripheral neuropathy (HCC) (Chronic)    Sounds to be quite out of control Enc her to cut sugar in diet (like OJ) Overdue for endocrinology f/u-enc her to make that appt We will add A1C to next labs which we are planning       Papillary thyroid carcinoma [facility])    Doing well No clinical changes  Sees endocrinology for post surgical hypothyroidism       Postsurgical hypothyroidism    Overdue for visit with endocrinology -Dr Cruzita Lederer We will include TSH in her upcoming labs  Some fatigue and leg weakness       Cushing's syndrome ([place])    Overdue for endocrinology visit  Enc her to schedule that  Glucose has been up         Nervous and Auditory   Leg weakness, bilateral    This is new and hard to describe per pt  Some pain also and "weird" rushing feelings in head Her mother had MS and she is very worried she may also have it  No falls fortunately  Cannot exercise  Will ref to neurology at her request         Other   HYPERLIPIDEMIA    Disc goals for lipids and reasons to control them Rev last labs with pt Rev low sat fat diet in detail Continues simvastatin  Will plan a lab visit          Depression with anxiety    Up and down days Reviewed stressors/ coping techniques/symptoms/ support sources/ tx options and side effects in detail today Continues wellbutrin and zoloft which help significantly  Disc imp of self care/exercise/outdoor time       OBESITY, MORBID    Discussed how this problem influences overall health and the risks it imposes  Reviewed plan for weight loss with lower calorie diet (via better food choices and also portion control or program like weight watchers) and exercise building up to or more than 30 minutes 5 days per week including some aerobic activity         Insomnia    Pt continues ambien - 5 mg on weekdays and 10 on weekends Very helpful No side effects            Follow Up Instructions: We will call you to schedule labs and bp check from car Also regarding neurology referral Please call Dr Arman Filter office to f/u for  thyroid and diabetes   Try hard to stick to a diabetic diet  Also add exercise when you feel you are able    I discussed the assessment and treatment plan with the patient. The patient was provided an opportunity to ask questions and all were answered. The patient agreed with the plan and demonstrated an understanding of the instructions.   The patient was advised to call back or seek an in-person evaluation if the symptoms worsen or if the condition fails to improve as anticipated.     Loura Pardon, MD

## 2018-08-10 NOTE — Assessment & Plan Note (Signed)
Doing well No clinical changes  Sees endocrinology for post surgical hypothyroidism

## 2018-08-10 NOTE — Assessment & Plan Note (Signed)
Overdue for endocrinology visit  Enc her to schedule that  Glucose has been up

## 2018-08-10 NOTE — Patient Instructions (Addendum)
We will call you to schedule labs and bp check from car Also regarding neurology referral Please call Dr Arman Filter office to f/u for thyroid and diabetes   Try hard to stick to a diabetic diet  Also add exercise when you feel you are able

## 2018-08-10 NOTE — Assessment & Plan Note (Signed)
Pt continues ambien - 5 mg on weekdays and 10 on weekends Very helpful No side effects

## 2018-08-10 NOTE — Assessment & Plan Note (Signed)
Sounds to be quite out of control Enc her to cut sugar in diet (like OJ) Overdue for endocrinology f/u-enc her to make that appt We will add A1C to next labs which we are planning

## 2018-08-10 NOTE — Assessment & Plan Note (Signed)
Overdue for visit with endocrinology -Dr Cruzita Lederer We will include TSH in her upcoming labs  Some fatigue and leg weakness

## 2018-08-10 NOTE — Assessment & Plan Note (Signed)
Disc goals for lipids and reasons to control them Rev last labs with pt Rev low sat fat diet in detail Continues simvastatin  Will plan a lab visit

## 2018-08-10 NOTE — Assessment & Plan Note (Signed)
bp is up  BP Readings from Last 1 Encounters:  01/14/18 (!) 150/92  we will plan a drive through BP check Most recent labs reviewed  Disc lifstyle change with low sodium diet and exercise

## 2018-08-10 NOTE — Assessment & Plan Note (Signed)
Discussed how this problem influences overall health and the risks it imposes  Reviewed plan for weight loss with lower calorie diet (via better food choices and also portion control or program like weight watchers) and exercise building up to or more than 30 minutes 5 days per week including some aerobic activity    

## 2018-08-10 NOTE — Assessment & Plan Note (Signed)
Up and down days Reviewed stressors/ coping techniques/symptoms/ support sources/ tx options and side effects in detail today Continues wellbutrin and zoloft which help significantly  Disc imp of self care/exercise/outdoor time

## 2018-08-10 NOTE — Assessment & Plan Note (Signed)
This is new and hard to describe per pt  Some pain also and "weird" rushing feelings in head Her mother had MS and she is very worried she may also have it  No falls fortunately  Cannot exercise  Will ref to neurology at her request

## 2018-08-11 ENCOUNTER — Telehealth: Payer: Self-pay | Admitting: Family Medicine

## 2018-08-11 NOTE — Telephone Encounter (Signed)
I left a message on patient's voice mail to call back and schedule non fasting lab and nurse visit for bp check.  I asked her not to schedule it on Thursday (Shapale's 1/2 day).  Otherwise, it can be scheduled anytime and put see [place] on the nurse visit.

## 2018-08-14 ENCOUNTER — Ambulatory Visit (INDEPENDENT_AMBULATORY_CARE_PROVIDER_SITE_OTHER): Payer: PRIVATE HEALTH INSURANCE | Admitting: *Deleted

## 2018-08-14 ENCOUNTER — Other Ambulatory Visit (INDEPENDENT_AMBULATORY_CARE_PROVIDER_SITE_OTHER): Payer: PRIVATE HEALTH INSURANCE

## 2018-08-14 VITALS — BP 130/82 | HR 97 | Temp 98.1°F

## 2018-08-14 DIAGNOSIS — E890 Postprocedural hypothyroidism: Secondary | ICD-10-CM

## 2018-08-14 DIAGNOSIS — E1142 Type 2 diabetes mellitus with diabetic polyneuropathy: Secondary | ICD-10-CM | POA: Diagnosis not present

## 2018-08-14 DIAGNOSIS — I10 Essential (primary) hypertension: Secondary | ICD-10-CM

## 2018-08-14 DIAGNOSIS — E782 Mixed hyperlipidemia: Secondary | ICD-10-CM

## 2018-08-14 DIAGNOSIS — E1165 Type 2 diabetes mellitus with hyperglycemia: Secondary | ICD-10-CM

## 2018-08-14 DIAGNOSIS — IMO0002 Reserved for concepts with insufficient information to code with codable children: Secondary | ICD-10-CM

## 2018-08-14 LAB — CBC WITH DIFFERENTIAL/PLATELET
Basophils Absolute: 0.1 10*3/uL (ref 0.0–0.1)
Basophils Relative: 0.8 % (ref 0.0–3.0)
Eosinophils Absolute: 0.3 10*3/uL (ref 0.0–0.7)
Eosinophils Relative: 3.7 % (ref 0.0–5.0)
HCT: 38.3 % (ref 36.0–46.0)
Hemoglobin: 12.7 g/dL (ref 12.0–15.0)
Lymphocytes Relative: 24.7 % (ref 12.0–46.0)
Lymphs Abs: 1.8 10*3/uL (ref 0.7–4.0)
MCHC: 33.2 g/dL (ref 30.0–36.0)
MCV: 88.9 fl (ref 78.0–100.0)
Monocytes Absolute: 0.4 10*3/uL (ref 0.1–1.0)
Monocytes Relative: 5.9 % (ref 3.0–12.0)
Neutro Abs: 4.6 10*3/uL (ref 1.4–7.7)
Neutrophils Relative %: 64.9 % (ref 43.0–77.0)
Platelets: 251 10*3/uL (ref 150.0–400.0)
RBC: 4.31 Mil/uL (ref 3.87–5.11)
RDW: 15.5 % (ref 11.5–15.5)
WBC: 7.1 10*3/uL (ref 4.0–10.5)

## 2018-08-14 LAB — HEMOGLOBIN A1C: Hgb A1c MFr Bld: 9.6 % — ABNORMAL HIGH (ref 4.6–6.5)

## 2018-08-14 LAB — COMPREHENSIVE METABOLIC PANEL
ALT: 18 U/L (ref 0–35)
AST: 15 U/L (ref 0–37)
Albumin: 4.2 g/dL (ref 3.5–5.2)
Alkaline Phosphatase: 67 U/L (ref 39–117)
BUN: 15 mg/dL (ref 6–23)
CO2: 31 mEq/L (ref 19–32)
Calcium: 9.3 mg/dL (ref 8.4–10.5)
Chloride: 102 mEq/L (ref 96–112)
Creatinine, Ser: 0.78 mg/dL (ref 0.40–1.20)
GFR: 77.32 mL/min (ref >60.00)
Glucose, Bld: 209 mg/dL — ABNORMAL HIGH (ref 70–99)
Potassium: 4.2 mEq/L (ref 3.5–5.1)
Sodium: 141 mEq/L (ref 135–145)
Total Bilirubin: 0.4 mg/dL (ref 0.2–1.2)
Total Protein: 6.9 g/dL (ref 6.0–8.3)

## 2018-08-14 LAB — LIPID PANEL
Cholesterol: 169 mg/dL (ref 0–200)
HDL: 36.6 mg/dL — ABNORMAL LOW (ref >39.00)
NonHDL: 132.35
Total CHOL/HDL Ratio: 5
Triglycerides: 278 mg/dL — ABNORMAL HIGH (ref 0.0–149.0)
VLDL: 55.6 mg/dL — ABNORMAL HIGH (ref 0.0–40.0)

## 2018-08-14 LAB — LDL CHOLESTEROL, DIRECT: Direct LDL: 100 mg/dL

## 2018-08-14 LAB — TSH: TSH: 20.95 u[IU]/mL — ABNORMAL HIGH (ref 0.35–4.50)

## 2018-08-14 NOTE — Progress Notes (Signed)
Per Dr. Glori Bickers encounter order on 08/14/18, patient presents today for a nurse visit blood pressure check for ongoing follow up and management.  Vital Sign Readings today 130/82.

## 2018-08-21 ENCOUNTER — Other Ambulatory Visit: Payer: Self-pay | Admitting: Internal Medicine

## 2018-08-25 ENCOUNTER — Encounter: Payer: Self-pay | Admitting: Family Medicine

## 2018-08-25 ENCOUNTER — Other Ambulatory Visit: Payer: Self-pay | Admitting: Family Medicine

## 2018-08-28 ENCOUNTER — Other Ambulatory Visit: Payer: Self-pay | Admitting: Family Medicine

## 2018-09-09 ENCOUNTER — Other Ambulatory Visit: Payer: Self-pay | Admitting: Family Medicine

## 2018-09-09 ENCOUNTER — Telehealth: Payer: Self-pay | Admitting: *Deleted

## 2018-09-09 NOTE — Telephone Encounter (Signed)
We received an order for pt to have her C-pap machine/ supplies ordered. We haven't ordered this before and it's asking for pressure setting. We usually refer pt to a pulmonary office for a sleep study to get settings and the pulmonologist takes care of ordering the cpap supplies.  Called pt and left VM letting her know that we don't do this and to call us back to discuss getting a referral to pulmonary to get a sleep study done

## 2018-09-10 NOTE — Telephone Encounter (Signed)
Pt returned my call she said she still does see Dr. Lamonte Sakai and that the order was suppose to go to them, I called [facility] and advised them of this info and they said they will send order to Dr. Lamonte Sakai

## 2018-09-16 ENCOUNTER — Encounter (HOSPITAL_COMMUNITY): Payer: Self-pay | Admitting: *Deleted

## 2018-09-16 NOTE — Progress Notes (Signed)
Received fax from pt stating she needed an updated letter for her work stating she needs to park in the lower level parking deck so she does not have to climb stairs as that is difficult for her to do with her HF and pulm htn.  Letter completed and sent to pt via my chart.

## 2018-09-22 ENCOUNTER — Other Ambulatory Visit: Payer: Self-pay | Admitting: Internal Medicine

## 2018-09-22 NOTE — Telephone Encounter (Signed)
She was lost for follow-up.  Refills per PCP.

## 2018-09-22 NOTE — Telephone Encounter (Signed)
Not seen since June 2019.  Please advise.

## 2018-09-24 ENCOUNTER — Encounter: Payer: Self-pay | Admitting: Family Medicine

## 2018-09-24 ENCOUNTER — Telehealth: Payer: Self-pay | Admitting: Internal Medicine

## 2018-09-24 MED ORDER — FLUCONAZOLE 150 MG PO TABS: 150.0000 mg | ORAL_TABLET | Freq: Once | ORAL | 0 refills | Status: AC

## 2018-09-24 NOTE — Telephone Encounter (Signed)
See MyChart message.  We need to see her in clinic, it has been over a year and will need lab work and may need dose changes to her medications.

## 2018-09-24 NOTE — Telephone Encounter (Signed)
Patient sent a MyChart message requesting a virtual visit appt due to Covid 19. Patient requested virtual appt on 09/29/18 in the afternoon. I called patient and LMTCB. Please advise.

## 2018-09-29 ENCOUNTER — Encounter: Payer: Self-pay | Admitting: Neurology

## 2018-09-29 ENCOUNTER — Other Ambulatory Visit: Payer: Self-pay

## 2018-09-29 ENCOUNTER — Ambulatory Visit: Payer: PRIVATE HEALTH INSURANCE | Admitting: Neurology

## 2018-09-29 VITALS — BP 144/86 | HR 71 | Temp 96.9°F | Ht 65.0 in | Wt 275.5 lb

## 2018-09-29 DIAGNOSIS — R29898 Other symptoms and signs involving the musculoskeletal system: Secondary | ICD-10-CM

## 2018-09-29 DIAGNOSIS — R413 Other amnesia: Secondary | ICD-10-CM | POA: Diagnosis not present

## 2018-09-29 DIAGNOSIS — R200 Anesthesia of skin: Secondary | ICD-10-CM

## 2018-09-29 DIAGNOSIS — G4733 Obstructive sleep apnea (adult) (pediatric): Secondary | ICD-10-CM

## 2018-09-29 DIAGNOSIS — E1142 Type 2 diabetes mellitus with diabetic polyneuropathy: Secondary | ICD-10-CM | POA: Diagnosis not present

## 2018-09-29 DIAGNOSIS — F418 Other specified anxiety disorders: Secondary | ICD-10-CM

## 2018-09-29 DIAGNOSIS — I5032 Chronic diastolic (congestive) heart failure: Secondary | ICD-10-CM

## 2018-09-29 DIAGNOSIS — Z820 Family history of epilepsy and other diseases of the nervous system: Secondary | ICD-10-CM

## 2018-09-29 DIAGNOSIS — E1165 Type 2 diabetes mellitus with hyperglycemia: Secondary | ICD-10-CM

## 2018-09-29 DIAGNOSIS — R531 Weakness: Secondary | ICD-10-CM | POA: Insufficient documentation

## 2018-09-29 DIAGNOSIS — E559 Vitamin D deficiency, unspecified: Secondary | ICD-10-CM

## 2018-09-29 DIAGNOSIS — IMO0002 Reserved for concepts with insufficient information to code with codable children: Secondary | ICD-10-CM

## 2018-09-29 DIAGNOSIS — G4719 Other hypersomnia: Secondary | ICD-10-CM

## 2018-09-29 MED ORDER — ARMODAFINIL 200 MG PO TABS: ORAL_TABLET | ORAL | 5 refills | Status: DC

## 2018-09-29 NOTE — Progress Notes (Signed)
[facility]  PATIENT: [name] DOB: [birth year]  REFERRING DOCTOR OR PCP:  Loura Pardon SOURCE: Patient, notes from primary care, laboratory reports, sleep reports.  _________________________________   HISTORICAL  CHIEF COMPLAINT:  Chief Complaint  Patient presents with   New Patient (Initial Visit)    RM 12, alone. Internal referral for leg weakness (bilateral). Has family hx MS. Her mother has MS. In nursing facility. On Betaseron. Supposed to have gone to eye doctor in June but d/t covid she r/s to January. She has had some vision changes. Has hx diabetes.     Fall    She fell off porch, she lost her balance back in early fall. She stumbles while walking. Has near falls.    HISTORY OF PRESENT ILLNESS:  I had the pleasure seeing your patient, [name], at [facility] for neurologic consultation regarding her weakness, numbness, fatigue and cognitive concerns.  She is a 53 year old woman who has had multiple symptoms over the past 2-3 years.   While sitting, she will sometimes gets electric shock sensatios in her neck that shoot into her head.    These occur a few times a month and just last seconds at a time.   She noted that she gets spasms in her buttocks when she walks longer distances, less so with shorter distances.   She feels her walking distance / endurance is less.    Both legs feel weak, symmetrically.   She also notes some weakness and numbness in her hands and she often drops items.    She notes mild urinary frequency but no nocturia most nights.   She has had nocturnal incontinence every 3 months or so.      She has had fatigue x many years that has worsened the past year.   She also notes difficulty with her focus and attention over the last 2 years.    She also notes reduced short term memory.     Additionally, she notes reduced processing speed.  She has apathy (chores, intimacy, organizing, etc).  She has some  tearfulness.  She became tearful several times during our interaction today..   No suicidal thoughts.   She is on 150 mg Wellbutrin XL and 150 mg sertraline.   She often dozes off if not mentally stimulated, worse the past few years.    She sleeps between 1030 pm and 700 am and stays asleep fairly well.   She has OSA and is on CPAP (Dr. Lamonte Sakai but no recent appt) since 2013.Marland Kitchen   According to a 2013 note, she had ben diagnosed earlier but had not started CPAP initially.  She also has pulmonary artery hypertension and decompensated diastolic heart failure (sees Dr. Haroldine Laws).    Weight is fairly stable.    She also has Type 2 IDDM (insulin and pills)  Her mother has MS and is on Betaseron [place] sees Dr. Jannifer Franklin).     EPWORTH SLEEPINESS SCALE  On a scale of 0 - 3 what is the chance of dozing:  Sitting and Reading:   0 Watching TV:    3 Sitting inactive in a public place: 2 Passenger in car for one hour: 0 Lying down to rest in the afternoon: 3 Sitting and talking to someone: 0 Sitting quietly after lunch:  3 In a car, stopped in traffic:  0  Total (out of 24):   11/24 mild excessively sleepy   REVIEW OF SYSTEMS: Constitutional: No fevers,  chills, sweats, or change in appetite.  She has fatigue and sleepiness Eyes: No visual changes, double vision, eye pain Ear, nose and throat: No hearing loss, ear pain, nasal congestion, sore throat Cardiovascular: No chest pain, palpitations.  She has diastolic dysfunction CHF Respiratory: No shortness of breath at rest or with exertion.   She has obstructive sleep apnea.  She is on CPAP GastrointestinaI: No nausea, vomiting, diarrhea, abdominal pain, fecal incontinence Genitourinary: No dysuria, urinary retention or frequency.  No nocturia. Musculoskeletal: No neck pain, back pain Integumentary: No rash, pruritus, skin lesions Neurological: as above Psychiatric: No depression at this time.  No anxiety Endocrine: No palpitations,  diaphoresis, change in appetite, change in weigh or increased thirst Hematologic/Lymphatic: No anemia, purpura, petechiae. Allergic/Immunologic: No itchy/runny eyes, nasal congestion, recent allergic reactions, rashes  ALLERGIES: Allergies  Allergen Reactions   Tape Rash    Plastic tape   Buspirone Hcl Nausea Only   Glipizide     REACTION: sleepy, low sugar   Ivp Dye [Iodinated Diagnostic Agents]     Shut kidneys down   Oxycodone-Acetaminophen Itching    HOME MEDICATIONS:  Current Outpatient Medications:    Albuterol Sulfate (PROAIR RESPICLICK) 500 (90 Base) MCG/ACT AEPB, Inhale 2 puffs into the lungs every 6 (six) hours as needed for up to 6 doses., Disp: 1 each, Rfl: 1   BIOTIN PO, Take 10,000 mg by mouth every morning., Disp: , Rfl:    buPROPion (WELLBUTRIN XL) 150 MG 24 hr tablet, TAKE 1 TABLET BY MOUTH EVERY DAY, Disp: 30 tablet, Rfl: 11   carvedilol (COREG) 3.125 MG tablet, Take 1 tablet (3.125 mg total) by mouth 2 (two) times daily with a meal., Disp: 60 tablet, Rfl: 6   cetirizine (ZYRTEC) 10 MG tablet, TAKE 1 TABLET BY MOUTH EVERY DAY, Disp: 30 tablet, Rfl: 5   Continuous Blood Gluc Sensor (FREESTYLE LIBRE 14 DAY SENSOR) MISC, CHANGE EVERY 14 DAYS, Disp: 2 each, Rfl: 11   ferrous sulfate 325 (65 FE) MG tablet, Take 325 mg by mouth 2 (two) times daily.  , Disp: , Rfl:    fluticasone (FLONASE) 50 MCG/ACT nasal spray, Place 2 sprays into both nostrils daily as needed for allergies., Disp: 16 g, Rfl: 11   glucose blood (ONE TOUCH ULTRA TEST) test strip, Use 2x a day, Disp: 100 each, Rfl: 2   Insulin Glargine, 1 Unit Dial, (TOUJEO SOLOSTAR) 300 UNIT/ML SOPN, Inject 90 Units into the skin at bedtime., Disp: 27 pen, Rfl: 1   Insulin Pen Needle (B-D ULTRAFINE III SHORT PEN) 31G X 8 MM MISC, USE ONCE A DAY, Disp: 100 each, Rfl: 3   losartan (COZAAR) 100 MG tablet, Take 1 tablet (100 mg total) by mouth daily., Disp: 30 tablet, Rfl: 6   metFORMIN (GLUCOPHAGE) 1000 MG  tablet, TAKE 1 TABLET BY MOUTH TWICE A DAY WITH A MEAL, Disp: 180 tablet, Rfl: 3   pantoprazole (PROTONIX) 40 MG tablet, TAKE 1 TABLET BY MOUTH EVERY DAY, Disp: 90 tablet, Rfl: 1   potassium chloride (KLOR-CON) 20 MEQ packet, Take 20 mEq by mouth as needed., Disp: , Rfl:    sertraline (ZOLOFT) 100 MG tablet, Take 1.5 tablets (150 mg total) by mouth daily., Disp: 135 tablet, Rfl: 0   simvastatin (ZOCOR) 20 MG tablet, TAKE 1 TABLET BY MOUTH EVERY DAY IN THE EVENING, Disp: 90 tablet, Rfl: 3   spironolactone (ALDACTONE) 25 MG tablet, TAKE 1 TABLET BY MOUTH EVERY DAY, Disp: 30 tablet, Rfl: 5   torsemide (DEMADEX)  20 MG tablet, Take 1 tablet (20 mg total) by mouth daily as needed., Disp: 30 tablet, Rfl: 6   TRAVATAN Z 0.004 % SOLN ophthalmic solution, Place 1 drop into both eyes at bedtime. , Disp: , Rfl:    zolpidem (AMBIEN) 10 MG tablet, TAKE 1/2 TO 1 TABLET BY MOUTH AT BEDTIME AS NEEDED, Disp: 30 tablet, Rfl: 3  PAST MEDICAL HISTORY: Past Medical History:  Diagnosis Date   Allergy    SEASONAL   Anemia    iron deficiency   Anxiety    Arthritis    Asthma    a. 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   Cellulitis and abscess of leg 02/2014   CHF (congestive heart failure) ([place])    Chronic kidney disease    /98   Complication of anesthesia    DIFFICULT WAKING- 2011   Depression    a. Stress reaction 08/2011 in multiple social stressors.   Diabetes mellitus    Dysrhythmia    Gastritis    GERD (gastroesophageal reflux disease)    Heart murmur    Hemorrhoid    Hyperlipidemia    Hypertension    Hyperthyroidism    Insomnia    Morbid obesity (HCC)    OSA (obstructive sleep apnea) 01/17/2012   Papillary thyroid carcinoma (HCC)    PCOS (polycystic ovarian syndrome)    Sleep apnea    C PAP   Thyroid cancer ([place]) 2014   Vertigo     PAST SURGICAL HISTORY: Past Surgical History:  Procedure Laterality Date    CHOLECYSTECTOMY     HEMORRHOID SURGERY     INTRAUTERINE DEVICE INSERTION  2011   SHOULDER SURGERY  06/2009   THYROIDECTOMY  04/10/2012   Procedure: THYROIDECTOMY;  Surgeon: Jerrell Belfast, MD;  Location: [facility] OR;  Service: ENT;  Laterality: N/A;  Total Thyroidectomy   TONSILLECTOMY AND ADENOIDECTOMY  CHILD   TUBAL LIGATION      FAMILY HISTORY: Family History  Problem Relation Age of Onset   Cancer Father        colon cancer   Arthritis Father        osteoarthritis   Colon cancer Father    Colon polyps Father    Transient ischemic attack Father    COPD Father    Arthritis Mother        osteoarthritis   Asthma Mother    Hypertension Mother    Multiple sclerosis Mother    Ovarian cancer Maternal Aunt    Diabetes Paternal Aunt    Diabetes Paternal Uncle    Heart disease Maternal Grandfather     SOCIAL HISTORY:  Social History   Socioeconomic History   Marital status: Married    Spouse name: Not on file   Number of children: Not on file   Years of education: Not on file   Highest education level: Not on file  Occupational History   Not on file  Social Needs   Financial resource strain: Not on file   Food insecurity    Worry: Not on file    Inability: Not on file   Transportation needs    Medical: Not on file    Non-medical: Not on file  Tobacco Use   Smoking status: Former Smoker    Packs/day: 0.20    Years: 5.00    Pack years: 1.00    Quit date: 03/25/2004    Years since quitting: 14.5   Smokeless tobacco: Never Used  Substance and Sexual Activity   Alcohol use: No    Alcohol/week: 0.0 standard drinks   Drug use: No   Sexual activity: Yes  Lifestyle   Physical activity    Days per week: Not on file    Minutes per session: Not on file   Stress: Not on file  Relationships   Social connections    Talks on phone: Not on file    Gets together: Not on file    Attends religious service: Not on file    Active member of  club or organization: Not on file    Attends meetings of clubs or organizations: Not on file    Relationship status: Not on file   Intimate partner violence    Fear of current or ex partner: Not on file    Emotionally abused: Not on file    Physically abused: Not on file    Forced sexual activity: Not on file  Other Topics Concern   Not on file  Social History Narrative   Lives   Caffeine use:      PHYSICAL EXAM  Vitals:   09/29/18 1027  BP: (!) 144/86  Pulse: 71  Temp: (!) 96.9 F (36.1 C)  SpO2: 98%  Weight: 275 lb 8 oz (125 kg)  Height: 5\' 5"  (1.651 m)    Body mass index is 45.85 kg/m.   General: The patient is well-developed and well-nourished and in no acute distress  HEENT:  Head is [place]/AT.  Sclera are anicteric.  Funduscopic exam shows normal optic discs and retinal vessels.  Neck: No carotid bruits are noted.  The neck is nontender.  Cardiovascular: The heart has a regular rate and rhythm with a normal S1 and S2. There were no murmurs, gallops or rubs.    Skin: Extremities are without rash or  edema.  Musculoskeletal:  Back is nontender  Neurologic Exam  Mental status: The patient is alert and oriented x 2 2/3 (Tues 09/30/2018) at the time of the examination. The patient has apparent reduced recent (1/3 without prompt; no change category; 2/3 with list) and normal remote memory, with reduced normal attention span and concentration ability (100-??; WORLD-DLROW).   Speech is normal.  Cranial nerves: Extraocular movements are full. Pupils are equal, round, and reactive to light and accomodation.  She reported reduced color vision on the left.  Facial symmetry is present. There is good facial sensation to soft touch bilaterally.Facial strength is normal.  Trapezius and sternocleidomastoid strength is normal. No dysarthria is noted.  The tongue is midline, and the patient has symmetric elevation of the soft palate. No obvious hearing deficits are noted.  Motor:   Muscle bulk is normal.   Tone is normal. Strength is  5 / 5 in all 4 extremities.   Sensory: She has normal symmetric sensation to touch and vibration in the arms and knees.  There is reduced vibration sensation at the toes..  Coordination: Cerebellar testing reveals good finger-nose-finger and heel-to-shin bilaterally.  Gait and station: Station is normal.   Gait is normal.  Tandem gait is mildly wide.  Romberg is negative.   Reflexes: Deep tendon reflexes are symmetric and normal bilaterally in the arms, 1+ at the knees and ankles..   Plantar responses are flexor.    DIAGNOSTIC DATA (LABS, IMAGING, TESTING) - I reviewed patient records, labs, notes, testing and imaging myself where available.  Lab Results  Component Value Date   WBC 7.1 08/14/2018   HGB 12.7 08/14/2018  HCT 38.3 08/14/2018   MCV 88.9 08/14/2018   PLT 251.0 08/14/2018      Component Value Date/Time   NA 141 08/14/2018 0913   NA 138 07/09/2018 1358   K 4.2 08/14/2018 0913   CL 102 08/14/2018 0913   CO2 31 08/14/2018 0913   GLUCOSE 209 (H) 08/14/2018 0913   BUN 15 08/14/2018 0913   BUN 16 07/09/2018 1358   CREATININE 0.78 08/14/2018 0913   CALCIUM 9.3 08/14/2018 0913   PROT 6.9 08/14/2018 0913   ALBUMIN 4.2 08/14/2018 0913   AST 15 08/14/2018 0913   ALT 18 08/14/2018 0913   ALKPHOS 67 08/14/2018 0913   BILITOT 0.4 08/14/2018 0913   GFRNONAA 84 07/09/2018 1358   GFRAA 97 07/09/2018 1358   Lab Results  Component Value Date   CHOL 169 08/14/2018   HDL 36.60 (L) 08/14/2018   LDLCALC 71 06/16/2015   LDLDIRECT 100.0 08/14/2018   TRIG 278.0 (H) 08/14/2018   CHOLHDL 5 08/14/2018   Lab Results  Component Value Date   HGBA1C 9.6 (H) 08/14/2018   No results found for: [id number] Lab Results  Component Value Date   TSH 20.95 (H) 08/14/2018       ASSESSMENT AND PLAN  1. Memory loss   2. Numbness   3. Family history of MS (multiple sclerosis)   4. Uncontrolled type 2 diabetes mellitus with  peripheral neuropathy ([place])   5. OSA (obstructive sleep apnea) just got cpap at home this past week 01/17/12   6. Chronic diastolic heart failure (HCC)   7. Leg weakness, bilateral   8. Depression with anxiety   9. Excessive daytime sleepiness   10. Vitamin D deficiency    In summary, Ms. Depasquale is a 53 year old woman with multiple symptoms including mild gait issues and easy fatigue and mild weakness in the legs and numbness and weakness in the hands.  She also reports cognitive issues.  I had a long discussion with her about the etiology of her symptoms.  Most of the neurologic symptoms she had were mild or intermittent, not typical for MS which she is most concerned about due to the family history.  However, because her tandem gait was wide and she had symptoms potentially concerning for cervical myelopathy and some episodes of urinary incontinence, we need to investigate this possibility further.  I will check an MRI of the brain and cervical spine.  Due to her diabetes we will do this without contrast.  I also discussed with her that other medical issues could easily be explaining her fatigue and cognitive concerns.  This would include her obstructive sleep apnea, CHF, hypothyroidism.  Additionally she appears to have depression despite being on treatment.  We will check some blood work for both vitamin B12 and vitamin D.  She has a follow-up appointment with endocrinology.  I will place her on Nuvigil 200 mg every morning to see if that helps some of her excessive daytime sleepiness related to her obstructive sleep apnea.  This might also help cognition.  A stimulant could also be considered.  We will let her know the results of the imaging studies and laboratory test when we get the data.  I will also consider a nerve conduction and EMG study if the initial imaging testing is negative.  She will return to see me in 2 months but sooner if there are new or worsening neurologic symptoms.  Thank you  for asking me to see Ms. Logsdon.  Please let me  know if I can be of further assistance with her or other patients in the future.  [name] A. Felecia Shelling, MD, [facility] 08/23/4429, 54:00 AM Certified in Neurology, Clinical Neurophysiology, Sleep Medicine and Neuroimaging  [facility] [address], [place], [place] [address] [phone]

## 2018-09-30 ENCOUNTER — Telehealth: Payer: Self-pay | Admitting: Neurology

## 2018-09-30 ENCOUNTER — Telehealth: Payer: Self-pay | Admitting: *Deleted

## 2018-09-30 LAB — VITAMIN B12: Vitamin B-12: 350 pg/mL (ref 232–1245)

## 2018-09-30 LAB — VITAMIN D 25 HYDROXY (VIT D DEFICIENCY, FRACTURES): Vit D, 25-Hydroxy: 10.1 ng/mL — ABNORMAL LOW (ref 30.0–100.0)

## 2018-09-30 MED ORDER — VITAMIN D (ERGOCALCIFEROL) 1.25 MG (50000 UNIT) PO CAPS: 50000.0000 [IU] | ORAL_CAPSULE | ORAL | 4 refills | Status: DC

## 2018-09-30 NOTE — Telephone Encounter (Signed)
Took call from phone staff. Relayed results. Escribed rx vit D to pharmacy.

## 2018-09-30 NOTE — Telephone Encounter (Signed)
-----   Message from Britt Bottom, MD sent at 09/30/2018  8:59 AM EDT ----- Vit D is very low     B12 is normal 50000 U weekly    #13  #4

## 2018-09-30 NOTE — Telephone Encounter (Signed)
[employer] order sent to [facility]. They will obtain the auth and reach out to the patient to schedule.  

## 2018-09-30 NOTE — Telephone Encounter (Signed)
Called, LVM for pt to call office. 

## 2018-10-01 ENCOUNTER — Other Ambulatory Visit: Payer: Self-pay | Admitting: Family Medicine

## 2018-10-12 ENCOUNTER — Other Ambulatory Visit: Payer: Self-pay | Admitting: Internal Medicine

## 2018-10-12 ENCOUNTER — Other Ambulatory Visit: Payer: Self-pay | Admitting: Family Medicine

## 2018-10-21 ENCOUNTER — Other Ambulatory Visit: Payer: Self-pay | Admitting: Internal Medicine

## 2018-10-22 ENCOUNTER — Other Ambulatory Visit: Payer: Self-pay | Admitting: Family Medicine

## 2018-10-22 NOTE — Telephone Encounter (Signed)
Name of Medication: Ambien Name of Pharmacy: [facility] or Written Date and Quantity: 06/02/18 #30 tabs with 3 refills Last Office Visit and Type: F/U on 08/10/18 Next Office Visit and Type: none scheduled

## 2018-10-24 ENCOUNTER — Ambulatory Visit
Admission: RE | Admit: 2018-10-24 | Discharge: 2018-10-24 | Disposition: A | Discharge status: Home / Self Care | Payer: PRIVATE HEALTH INSURANCE | Source: Ambulatory Visit | Attending: Neurology | Admitting: Neurology

## 2018-10-24 ENCOUNTER — Other Ambulatory Visit: Payer: Self-pay

## 2018-10-24 DIAGNOSIS — R29898 Other symptoms and signs involving the musculoskeletal system: Secondary | ICD-10-CM

## 2018-10-24 DIAGNOSIS — R200 Anesthesia of skin: Secondary | ICD-10-CM

## 2018-10-24 DIAGNOSIS — R413 Other amnesia: Secondary | ICD-10-CM

## 2018-10-25 ENCOUNTER — Other Ambulatory Visit: Payer: Self-pay | Admitting: Family Medicine

## 2018-10-26 ENCOUNTER — Telehealth: Payer: Self-pay | Admitting: *Deleted

## 2018-10-26 DIAGNOSIS — R29898 Other symptoms and signs involving the musculoskeletal system: Secondary | ICD-10-CM

## 2018-10-26 DIAGNOSIS — R200 Anesthesia of skin: Secondary | ICD-10-CM

## 2018-10-26 NOTE — Telephone Encounter (Signed)
-----   Message from Britt Bottom, MD sent at 10/26/2018  7:40 AM EDT ----- Please let the patient know that the MRI is fine: Spinal cord is normal, just some mild disc bulges --- nothing pushing on any nerves

## 2018-10-27 NOTE — Telephone Encounter (Signed)
Called patient to schedule NCV / EMG study. Patient and I scheduled for 11/24/18

## 2018-10-27 NOTE — Telephone Encounter (Signed)
[employer] auth: [id number] (exp. 10/21/18 to 01/19/19) patient had MRI at [facility] on 10/24/18.

## 2018-10-27 NOTE — Addendum Note (Signed)
Addended by: Hope Pigeon on: 10/27/2018 07:49 AM   Modules accepted: Orders

## 2018-10-29 ENCOUNTER — Other Ambulatory Visit: Payer: Self-pay | Admitting: Internal Medicine

## 2018-11-05 ENCOUNTER — Telehealth: Payer: Self-pay | Admitting: Internal Medicine

## 2018-11-05 NOTE — Telephone Encounter (Signed)
Pt has not been seen in within the year. She states she has been out of her medication for a month and is starting to have some symptoms but did not specify. I set her up for an appt on 9/4-the soonest we had. She is asking to see if we can get her in sooner or to be sure that she will be ok to continue without the meds we rx for another few weeks

## 2018-11-08 NOTE — Telephone Encounter (Signed)
OK to refill >> start all of her meds and I will see her in September.

## 2018-11-10 NOTE — Telephone Encounter (Signed)
Left message for patient to return call, need to know which medications she needs now.

## 2018-11-11 MED ORDER — METFORMIN HCL 1000 MG PO TABS: ORAL_TABLET | ORAL | 3 refills | Status: DC

## 2018-11-11 MED ORDER — BD PEN NEEDLE SHORT U/F 31G X 8 MM MISC: 3 refills | Status: DC

## 2018-11-11 MED ORDER — FREESTYLE LIBRE 14 DAY SENSOR MISC: 1.0000 | 11 refills | Status: DC

## 2018-11-11 MED ORDER — TOUJEO SOLOSTAR 300 UNIT/ML ~~LOC~~ SOPN: 90.0000 [IU] | PEN_INJECTOR | Freq: Every day | SUBCUTANEOUS | 1 refills | Status: DC

## 2018-11-11 NOTE — Telephone Encounter (Signed)
Spoke to patient, medications have been sent.

## 2018-11-11 NOTE — Addendum Note (Signed)
Addended by: Cardell Peach I on: 11/11/2018 01:07 PM   Modules accepted: Orders

## 2018-11-24 ENCOUNTER — Other Ambulatory Visit: Payer: Self-pay

## 2018-11-24 ENCOUNTER — Ambulatory Visit (INDEPENDENT_AMBULATORY_CARE_PROVIDER_SITE_OTHER): Payer: PRIVATE HEALTH INSURANCE | Admitting: Neurology

## 2018-11-24 ENCOUNTER — Other Ambulatory Visit: Payer: Self-pay | Admitting: Neurology

## 2018-11-24 ENCOUNTER — Ambulatory Visit: Payer: PRIVATE HEALTH INSURANCE | Admitting: Neurology

## 2018-11-24 DIAGNOSIS — R29898 Other symptoms and signs involving the musculoskeletal system: Secondary | ICD-10-CM

## 2018-11-24 DIAGNOSIS — R200 Anesthesia of skin: Secondary | ICD-10-CM | POA: Diagnosis not present

## 2018-11-24 DIAGNOSIS — G5603 Carpal tunnel syndrome, bilateral upper limbs: Secondary | ICD-10-CM | POA: Insufficient documentation

## 2018-11-24 DIAGNOSIS — Z0289 Encounter for other administrative examinations: Secondary | ICD-10-CM

## 2018-11-24 NOTE — Progress Notes (Signed)
Full Name: [name] Gender: Female MRN #: [id number] Date of Birth: [birth year]    Visit Date: 11/24/2018 09:08 Age: 53 Years 0 Months Old Examining Physician: Arlice Colt, MD  Referring Physician: Arlice Colt, MD    History:  [name] is a 53 year old woman with pain in the arms and legs.  She also notes that her legs feel weak at times.  She also notes some weakness and numbness in the hands and she will sometimes drop items.  On exam, strength is 4+/5 in the APB muscles.  Nerve conduction studies: Bilateral median motor responses had delayed distal latencies across the wrist, right worse than left.  Amplitude and forearm conduction velocities were normal.  Both median sensory responses were absent.  The right ulnar motor response had a normal distal latency, amplitude and conduction velocity.  The right ulnar sensory response had a normal peak latency but reduced amplitude.  The right radial sensory response was normal.  The left ulnar sensory response had a normal peak latency and borderline amplitude.  In the right leg, the right peroneal and tibial motor responses had normal distal latencies, amplitudes and conduction velocities.  The right superficial peroneal sensory response was normal.  The right sural sensory response had a normal peak latency but mildly reduced amplitude.  The right tibial right ulnar F-wave responses were normal.  Electromyography: In the right arm, motor unit morphology and recruitment was normal in all muscles tested.  A few polyphasic motor units were seen in the right extensor digitorum communis muscle but recruitment was normal..  There is no abnormal spontaneous activity.  The left APB muscle was normal.  In the right leg, a couple of the L5 or S1 innervated muscles had increase number of polyphasic motor units with normal recruitment patterns.  The right abductor hallucis muscle had increased polyphasic units with a reduced recruitment  pattern.  Impression: This NCV/EMG study shows the following: 1.   Bilateral moderate median neuropathies at the wrist (carpal tunnel syndromes) 2.   Probable mild right L5 or S1 chronic radiculopathy without active features.  [place] Monica A. Felecia Shelling, MD, PhD, FAAN Certified in Neurology, Clinical Neurophysiology, Sleep Medicine, Pain Medicine and Neuroimaging Director, [facility] [address] [place], [place] [address] [phone]  Clinical note: The patient was advised to wear wrist splints at night.  If not better we will refer to surgery for the bilateral carpal tunnel syndrome.  If symptoms persist, consider lumbar MRI. --RAS      MNC    Nerve / Sites Muscle Latency Ref. Amplitude Ref. Rel Amp Segments Distance Velocity Ref. Area    ms ms mV mV %  cm m/s m/s mVms  R Median - APB     Wrist APB 8.0 ?4.4 4.6 ?4.0 100 Wrist - APB 7   15.2     Upper arm APB 12.2  4.0  87.5 Upper arm - Wrist 21 50 ?49 14.2  L Median - APB     Wrist APB 5.8 ?4.4 4.1 ?4.0 100 Wrist - APB 7   13.9     Upper arm APB 10.1  4.0  97.4 Upper arm - Wrist 21 50 ?49 13.7  R Ulnar - ADM     Wrist ADM 2.4 ?3.3 9.7 ?6.0 100 Wrist - ADM 7   22.4     B.Elbow ADM 6.4  7.3  75.9 B.Elbow - Wrist  20 51 ?49 18.8     A.Elbow ADM 8.4  6.5  89.3 A.Elbow - B.Elbow 10 49 ?49 18.1         A.Elbow - Wrist      R Peroneal - EDB     Ankle EDB 4.4 ?6.5 4.5 ?2.0 100 Ankle - EDB 9   15.2     Fib head EDB 10.4  4.0  89 Fib head - Ankle 26 44 ?44 15.4     Pop fossa EDB 12.7  3.1  78.6 Pop fossa - Fib head 10 44 ?44 11.7         Pop fossa - Ankle      R Tibial - AH     Ankle AH 4.2 ?5.8 6.2 ?4.0 100 Ankle - AH 9   15.5     Pop fossa AH 12.6  5.3  84.6 Pop fossa - Ankle 34 41 ?41 14.3               SNC    Nerve / Sites Rec. Site Peak Lat Ref.  Amp Ref. Segments Distance    ms ms V V  cm  R Radial - Anatomical snuff box (Forearm)      Forearm Wrist 2.3 ?2.9 21 ?15 Forearm - Wrist 10  R Sural - Ankle (Calf)     Calf Ankle 4.2 ?4.4 2 ?6 Calf - Ankle 14  R Superficial peroneal - Ankle     Lat leg Ankle 5.0 ?4.4 6 ?6 Lat leg - Ankle 14  R Median - Orthodromic (Dig II, Mid palm)     Dig II Wrist NR ?3.4 NR ?10 Dig II - Wrist 13  L Median - Orthodromic (Dig II, Mid palm)     Dig II Wrist NR ?3.4 NR ?10 Dig II - Wrist 13  R Ulnar - Orthodromic, (Dig V, Mid palm)     Dig V Wrist 2.8 ?3.1 1 ?5 Dig V - Wrist 11  L Ulnar - Orthodromic, (Dig V, Mid palm)     Dig V Wrist 2.9 ?3.1 4 ?5 Dig V - Wrist 20                   F  Wave    Nerve F Lat Ref.   ms ms  R Tibial - AH 54.7 ?56.0  R Ulnar - ADM 30.7 ?32.0         EMG full       EMG Summary Table    Spontaneous MUAP Recruitment  Muscle IA Fib PSW Fasc Other Amp Dur. Poly Pattern  R. Deltoid Normal None None None _______ Normal Normal Normal Normal  R. Triceps brachii Normal None None None _______ Normal Normal Normal Normal  R. Biceps brachii Normal None None None _______ Normal Normal Normal Normal  R. Extensor digitorum communis Normal None None None _______ Normal Normal 1+ Normal  R. First dorsal interosseous Normal None None None _______ Normal Normal Normal Normal  R. Abductor pollicis brevis Normal None None None _______ Normal Normal Normal Normal  R. Vastus medialis Normal None None None _______ Normal Normal Normal Normal  R. Tibialis anterior Normal None None None _______ Normal Normal 1+ Normal  R. Peroneus longus Normal None None None _______ Normal Normal Normal Normal  R. Gastrocnemius (Medial head) Normal None None None _______ Normal Normal Normal Normal  R. Abductor hallucis Normal None None None _______ Normal Normal 1+ Reduced  R. Gluteus medius Normal None None  None _______ Normal Normal Normal Normal  R. Iliopsoas Normal None None None _______ Normal Normal Normal Normal  L. Abductor pollicis brevis Normal None None None _______ Normal Normal Normal  Normal

## 2018-11-24 NOTE — Progress Notes (Signed)
[name], I did her EMG 11/24/2018.  We never came up with an explanation for her leg weakness.  After giving some more thought, I would like to check an MRI of the lumbar spine to rule out spinal stenosis.  I went ahead and placed the order.  Could you let [place] know.

## 2018-11-25 NOTE — Progress Notes (Signed)
Called, LVM for pt to call office. 

## 2018-11-25 NOTE — Progress Notes (Signed)
[name]- see note below. Can you send note? Thank you!  Called and spoke with pt. Relayed Dr. Garth Bigness message. She is agreeable to this. She is aware insurance approval will be done first and then she will be called to get scheduled.  She also would like procedure note from yesterday faxed to her at [facility]. Advised I will have [name] S in medical records take care of this for her.

## 2018-11-26 ENCOUNTER — Telehealth: Payer: Self-pay | Admitting: Neurology

## 2018-11-26 NOTE — Telephone Encounter (Signed)
Started authorization request, reference#[id number] clinicals faxed to [phone].

## 2018-11-27 ENCOUNTER — Ambulatory Visit: Payer: PRIVATE HEALTH INSURANCE | Admitting: Internal Medicine

## 2018-11-27 ENCOUNTER — Other Ambulatory Visit: Payer: Self-pay

## 2018-11-27 ENCOUNTER — Encounter: Payer: Self-pay | Admitting: Internal Medicine

## 2018-11-27 VITALS — BP 136/70 | HR 59 | Ht 65.0 in | Wt 274.0 lb

## 2018-11-27 DIAGNOSIS — E1165 Type 2 diabetes mellitus with hyperglycemia: Secondary | ICD-10-CM | POA: Diagnosis not present

## 2018-11-27 DIAGNOSIS — E1142 Type 2 diabetes mellitus with diabetic polyneuropathy: Secondary | ICD-10-CM

## 2018-11-27 DIAGNOSIS — IMO0002 Reserved for concepts with insufficient information to code with codable children: Secondary | ICD-10-CM

## 2018-11-27 DIAGNOSIS — E890 Postprocedural hypothyroidism: Secondary | ICD-10-CM

## 2018-11-27 DIAGNOSIS — C73 Malignant neoplasm of thyroid gland: Secondary | ICD-10-CM

## 2018-11-27 LAB — POCT GLYCOSYLATED HEMOGLOBIN (HGB A1C): Hemoglobin A1C: 10.2 % — AB (ref 4.0–5.6)

## 2018-11-27 MED ORDER — CANAGLIFLOZIN 300 MG PO TABS: 300.0000 mg | ORAL_TABLET | Freq: Every day | ORAL | 3 refills | Status: DC

## 2018-11-27 MED ORDER — OZEMPIC (0.25 OR 0.5 MG/DOSE) 2 MG/1.5ML ~~LOC~~ SOPN: 0.5000 mg | PEN_INJECTOR | SUBCUTANEOUS | 5 refills | Status: DC

## 2018-11-27 NOTE — Progress Notes (Signed)
Patient ID: [name], female   DOB: December 30, 1965, 53 y.o.   MRN: [id number]  HPI: [name] is a 53 y.o.-year-old female, returning for f/u for DM2, dx 2008, insulin-dependent since ~2011, uncontrolled, with complications (peripheral neuropathy, dCHF), h/o papillary thyroid cancer and iatrogenic hypothyroidism. Last visit 1 year and 2 months ago.  She ran out of meds in July for 1 mo >> sugars increase and they are not improving even after she restarted part of her regimen.  She did not restart Invokana and Ozempic.  DM2: Last hemoglobin A1c was: Lab Results  Component Value Date   HGBA1C 9.6 (H) 08/14/2018   HGBA1C 7.5 (A) 09/12/2017   HGBA1C 7.7 05/23/2017    She is on: - Toujeo 45 x 2 injections at night - Metformin 1000 mg 2x a day - Invokana 300 mg in a.m. >> off for months - Ozempic 0.5 mg weekly - started 08/2017 >> not taking this She was also on Glipizide but developed hypoglycemia >> loss of consciousness. She was also on Amaryl >> taken off.   She checks sugars once a day - am: 123-160 >> 170-220 >> 140-160 >> 132-181 >> 175-269 - before lunch: 116-163 >> n/c >> 115 >> 310-380 - before dinner: 113 >> n/c >> 106, 156  >> 214-349 - bedtime: 155-178, 211 >> n/c >> 160s >> 153, 159 >> n/c Lowest sugar was 170 >> 120s >> 140 Highest sugar was 275 >> 190 >> 380  Freestyle Libre CGM 14 day parameters (we will scan the reports): - average: 132 +/- 21.7% >> 267 - coefficient of variation: 16.4% >> 24.2% (19-25%) - time in range:  - low (<70): 0% >> 0% - normal (70-180): 97% >> 17% - high (>180): 3% >> 83%  Pt's meals are: - Breakfast: skips - 10 o'clock: banana, apple, nectarine - Lunch: fast food - Dinner: goes out to eat around 8 o'clock: chicken sandwich or a wrap - Snacks: 0, no concentrated sweets  -No CKD, last BUN/creatinine:  Lab Results  Component Value Date   BUN 15 08/14/2018   CREATININE 0.78 08/14/2018  Not on ACE inhibitor/ARB.  -+ HL;  last set of lipids: Lab Results  Component Value Date   CHOL 169 08/14/2018   HDL 36.60 (L) 08/14/2018   LDLCALC 71 06/16/2015   LDLDIRECT 100.0 08/14/2018   TRIG 278.0 (H) 08/14/2018   CHOLHDL 5 08/14/2018  On simvastatin  - last eye exam was in 06/2017: No DR. + glaucoma- on eyedrops - + Numbness and tingling in her feet.  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  Not have to have RAI treatment  Neck U/S (11/24/2014): Post thyroidectomy.  No suspicious lymphadenopathy or soft tissue.  Neck U/S (10/07/2017): Post total thyroidectomy without evidence residual, locally recurrent or metastatic disease.  Pt is on levothyroxine 175 mcg daily, taken: - in am - fasting - at least 30 min from b'fast - + Fe at lunch and at night and PPI at lunchtime - no calcium or multivitamins - not on Biotin MISSING DOSES!  She is not compliant with her levothyroxine doses.  TFTs are fluctuating.  Latest: Lab Results  Component Value Date   TSH 20.95 (H) 08/14/2018   TSH 2.011 12/15/2017   TSH 6.32 (H) 08/01/2017   TSH 2.83 05/23/2017   TSH 14.73 (H)  07/26/2016   TSH 9.17 (A) 04/02/2016   TSH 11.83 (H) 06/16/2015   TSH 1.82 11/03/2014   TSH 8.756 (H) 03/24/2014   TSH 2.96 02/14/2014   Pt denies: - feeling nodules in neck - hoarseness - choking - SOB with lying down But has chronic dysphagia.  Started vit D weekly for a very low vit D level.  She was started on biotin 10,000 mcg daily by neurology recently.  ROS: Constitutional: no weight gain/no weight loss, + fatigue, no subjective hyperthermia, no subjective hypothermia Eyes: no blurry vision, no xerophthalmia ENT: no sore throat, + see HPI Cardiovascular: no CP/no SOB/no palpitations/no leg swelling Respiratory: no cough/no SOB/no  wheezing Gastrointestinal: no N/no V/no D/no C/no acid reflux Musculoskeletal: + muscle aches/+ joint aches Skin: no rashes, no hair loss Neurological: no tremors/+ numbness/+ tingling/no dizziness  I reviewed pt's medications, allergies, PMH, social hx, family hx, and changes were documented in the history of present illness. Otherwise, unchanged from my initial visit note.  Past Medical History:  Diagnosis Date  . Allergy    SEASONAL  . Anemia    iron deficiency  . Anxiety   . Arthritis   . Asthma    a. 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  . Cellulitis and abscess of leg 02/2014  . CHF (congestive heart failure) ([place])   . Chronic kidney disease    /13  . Complication of anesthesia    DIFFICULT WAKING- 2011  . Depression    a. Stress reaction 08/2011 in multiple social stressors.  . Diabetes mellitus   . Dysrhythmia   . Gastritis   . GERD (gastroesophageal reflux disease)   . Heart murmur   . Hemorrhoid   . Hyperlipidemia   . Hypertension   . Hyperthyroidism   . Insomnia   . Morbid obesity ([place])   . OSA (obstructive sleep apnea) 01/17/2012  . Papillary thyroid carcinoma ([place])   . PCOS (polycystic ovarian syndrome)   . Sleep apnea    C PAP  . Thyroid cancer ([place]) 2014  . Vertigo    Past Surgical History:  Procedure Laterality Date  . CHOLECYSTECTOMY    . HEMORRHOID SURGERY    . INTRAUTERINE DEVICE INSERTION  2011  . SHOULDER SURGERY  06/2009  . THYROIDECTOMY  04/10/2012   Procedure: THYROIDECTOMY;  Surgeon: Jerrell Belfast, MD;  Location: [facility];  Service: ENT;  Laterality: N/A;  Total Thyroidectomy  . TONSILLECTOMY AND ADENOIDECTOMY  CHILD  . TUBAL LIGATION     Social History   Socioeconomic History  . Marital status: Married    Spouse name: Romana Deaton  . Number of children: 2  . Years of education: Some college  . Highest education level: Not on file  Occupational History  . Occupation: [employer]  Social Needs  . Financial resource strain: Not on file  . Food insecurity    Worry: Not on file    Inability: Not on file  . Transportation needs    Medical: Not on file    Non-medical: Not on file  Tobacco Use  . Smoking status: Former Smoker    Packs/day: 0.20    Years: 5.00    Pack years: 1.00    Quit date: 03/25/2004    Years since quitting: 14.6  . Smokeless tobacco: Never Used  Substance and Sexual Activity  . Alcohol use: No    Alcohol/week: 0.0 standard drinks  . Drug use: No  .  Sexual activity: Yes  Lifestyle  . Physical activity    Days per week: Not on file    Minutes per session: Not on file  . Stress: Not on file  Relationships  . Social Herbalist on phone: Not on file    Gets together: Not on file    Attends religious service: Not on file    Active member of club or organization: Not on file    Attends meetings of clubs or organizations: Not on file    Relationship status: Not on file  . Intimate partner violence    Fear of current or ex partner: Not on file    Emotionally abused: Not on file    Physically abused: Not on file    Forced sexual activity: Not on file  Other Topics Concern  . Not on file  Social History Narrative   Lives with son/husband   Caffeine use: daily   Right handed    Current Outpatient Medications on File Prior to Visit  Medication Sig Dispense Refill  . Albuterol Sulfate (PROAIR RESPICLICK) 009 (90 Base) MCG/ACT AEPB Inhale 2 puffs into the lungs every 6 (six) hours as needed for up to 6 doses. 1 each 1  . Armodafinil 200 MG TABS One po qAM 30 tablet 5  . BIOTIN PO Take 10,000 mg by mouth every morning.    Marland Kitchen buPROPion (WELLBUTRIN XL) 150 MG 24 hr tablet TAKE 1 TABLET BY MOUTH EVERY DAY 90 tablet 3  . carvedilol (COREG) 3.125 MG tablet Take 1 tablet (3.125 mg total) by mouth 2 (two) times daily with a meal. 60 tablet 6  . cetirizine (ZYRTEC) 10 MG tablet TAKE 1 TABLET BY MOUTH EVERY DAY 90 tablet 1  .  Continuous Blood Gluc Sensor (FREESTYLE LIBRE 14 DAY SENSOR) MISC 1 each by Subdermal route every 14 (fourteen) days. 2 each 11  . ferrous sulfate 325 (65 FE) MG tablet Take 325 mg by mouth 2 (two) times daily.      . fluticasone (FLONASE) 50 MCG/ACT nasal spray Place 2 sprays into both nostrils daily as needed for allergies. 16 g 11  . glucose blood (ONE TOUCH ULTRA TEST) test strip Use 2x a day 100 each 2  . Insulin Glargine, 1 Unit Dial, (TOUJEO SOLOSTAR) 300 UNIT/ML SOPN Inject 90 Units into the skin at bedtime. 27 pen 1  . Insulin Pen Needle (B-D ULTRAFINE III SHORT PEN) 31G X 8 MM MISC USE ONCE A DAY 100 each 3  . losartan (COZAAR) 100 MG tablet Take 1 tablet (100 mg total) by mouth daily. 30 tablet 6  . metFORMIN (GLUCOPHAGE) 1000 MG tablet TAKE 1 TABLET BY MOUTH TWICE A DAY WITH A MEAL 180 tablet 3  . pantoprazole (PROTONIX) 40 MG tablet TAKE 1 TABLET BY MOUTH EVERY DAY 90 tablet 1  . potassium chloride (KLOR-CON) 20 MEQ packet Take 20 mEq by mouth as needed.    . sertraline (ZOLOFT) 100 MG tablet TAKE 1.5 TABLETS BY MOUTH DAILY 135 tablet 2  . simvastatin (ZOCOR) 20 MG tablet TAKE 1 TABLET BY MOUTH EVERY DAY IN THE EVENING 90 tablet 3  . spironolactone (ALDACTONE) 25 MG tablet TAKE 1 TABLET BY MOUTH EVERY DAY 30 tablet 5  . torsemide (DEMADEX) 20 MG tablet Take 1 tablet (20 mg total) by mouth daily as needed. 30 tablet 6  . TRAVATAN Z 0.004 % SOLN ophthalmic solution Place 1 drop into both eyes at bedtime.     Marland Kitchen  Vitamin D, Ergocalciferol, (DRISDOL) 1.25 MG (50000 UT) CAPS capsule Take 1 capsule (50,000 Units total) by mouth every 7 (seven) days. 13 capsule 4  . zolpidem (AMBIEN) 10 MG tablet TAKE 1/2 TO 1 TABLET BY MOUTH AT BEDTIME AS NEEDED 30 tablet 3   No current facility-administered medications on file prior to visit.    Allergies  Allergen Reactions  . Tape Rash    Plastic tape  . Buspirone Hcl Nausea Only  . Glipizide     REACTION: sleepy, low sugar  . Ivp Dye [Iodinated  Diagnostic Agents]     Shut kidneys down  . Oxycodone-Acetaminophen Itching   Family History  Problem Relation Age of Onset  . Cancer Father        colon cancer  . Arthritis Father        osteoarthritis  . Colon cancer Father   . Colon polyps Father   . Transient ischemic attack Father   . COPD Father   . Arthritis Mother        osteoarthritis  . Asthma Mother   . Hypertension Mother   . Multiple sclerosis Mother   . Ovarian cancer Maternal Aunt   . Diabetes Paternal Aunt   . Diabetes Paternal Uncle   . Heart disease Maternal Grandfather     PE: BP 136/70   Pulse (!) 59   Ht 5' 5" (1.651 m)   Wt 274 lb (124.3 kg)   SpO2 98%   BMI 45.60 kg/m  Wt Readings from Last 3 Encounters:  11/27/18 274 lb (124.3 kg)  09/29/18 275 lb 8 oz (125 kg)  01/14/18 265 lb 3.2 oz (120.3 kg)   Constitutional: overweight, in NAD Eyes: PERRLA, EOMI, no exophthalmos ENT: moist mucous membranes, no thyromegaly, no cervical lymphadenopathy Cardiovascular: RRR, No RG, +2/6 SEM Respiratory: CTA B Gastrointestinal: abdomen soft, NT, ND, BS+ Musculoskeletal: no deformities, strength intact in all 4 Skin: moist, warm, no rashes Neurological: no tremor with outstretched hands, DTR normal in all 4  ASSESSMENT: 1. DM2, insulin-dependent, uncontrolled, without long-term complications but with hyperglycemia  2. Iatrogenic Hypothyroidism - h/o med noncompliance  3. [place] -No RAI treatment was necessary  PLAN:  1. Patient with history of uncontrolled diabetes, on basal insulin and oral antidiabetic regimen and also now GLP-1 receptor agonist added at last visit.  However, she was lost for follow-up after our last visit which was 1 year and 2 months ago. -Latest HbA1c was reviewed and this was very high, at 9.6% in 07/2018. -At last visit we discussed about stopping sodas and start working on her diet and activity -At this visit, sugars are very high from review of her CGM  downloads.  She did not scan the sensor many times so there are gaps in the data and I advised her to start scanning at least once every 8 hours. -She was off her medications for a long time (more than a month), and sugars did not improve even after she restarted her insulin and metformin.  She is still off Hart[place]t and Ozempic.  Sugars are usually in the 200s to 300s range. -At this visit, we will continue Toujeo and metformin and will restart Invokana and Ozempic.  I advised him to let me know in approximately 1 month if the sugars are not better.  In that case, we may need mealtime insulin. - I suggested to:  Patient Instructions  Please continue: - Toujeo 45 x 2 injections at night - Metformin 1000 mg 2x  a day  Restart: - Invokana 300 mg in a.m. - Ozempic 0.25 mg weekly x 2 weeks, then 0.5 mg weekly  Please continue Levothyroxine 175 mcg daily.  Takes this daily.  Take the thyroid hormone every day, with water, at least 30 minutes before breakfast, separated by at least 4 hours from: - acid reflux medications - calcium - iron - multivitamins  Will check thyroid tests at next visit. For this, stop Biotin x2 weeks before the visit.  Please return in 3 months with your sugar log.   - we checked her HbA1c: 10.2% (higher) - advised to check sugars at different times of the day - 4x a day, rotating check times - advised for yearly eye exams >> she is not UTD - return to clinic in 3 months  2. Iatrogenic Hypothyroidism Uncontrolled, due to incomplete compliance with her levothyroxine doses. - latest thyroid labs reviewed with pt >> high in 07/2018 (20.95) - she continues on LT4 175 mcg daily - pt feels good on this dose. - we discussed about taking the thyroid hormone every day, with water, >30 minutes before breakfast, separated by >4 hours from acid reflux medications, calcium, iron, multivitamins. Pt. is taking it correctly, but missing doses.  I again advised her to take it  every morning. - will check thyroid tests at next visit since she is now on high-dose biotin.  I advised her to stop the biotin 2 weeks before our next visit.  3. Follicular PTC -Neck ultrasound was normal 11/2014 -At last visit I ordered another neck ultrasound >> 10/07/2017: Neck ultrasound showed no locally recurrent or metastatic disease. -No neck compression symptoms other than chronic dysphagia  Philemon Kingdom, MD PhD [facility] Endocrinology

## 2018-11-27 NOTE — Addendum Note (Signed)
Addended by: Cardell Peach I on: 11/27/2018 02:15 PM   Modules accepted: Orders

## 2018-11-27 NOTE — Patient Instructions (Addendum)
Please continue: - Toujeo 45 x 2 injections at night - Metformin 1000 mg 2x a day  Restart: - Invokana 300 mg in a.m. - Ozempic 0.25 mg weekly x 2 weeks, then 0.5 mg weekly  Please continue Levothyroxine 175 mcg daily.  Takes this daily.  Take the thyroid hormone every day, with water, at least 30 minutes before breakfast, separated by at least 4 hours from: - acid reflux medications - calcium - iron - multivitamins  Will check thyroid tests at next visit. For this, stop Biotin x2 weeks before the visit.  Please return in 3 months with your sugar log.

## 2018-12-03 NOTE — Telephone Encounter (Signed)
I spoke with the nurse [place] and I faxed her more clinical notes she is going to look at the case and revise it.

## 2018-12-03 NOTE — Telephone Encounter (Signed)
I called [employer] to check the status on the case. They informed me that it has been denied. They transferred me to the nurse [place] and I had to leave a voicemail for her to call me back about what the option are about doing a peer to peer.

## 2018-12-07 NOTE — Telephone Encounter (Signed)
I spoke with the nurse [place] and after faxing the additional information she still was not able to get it approved. She is going to forward all the information for an appeal. She said she should hear back from 2-3 days but it can take up to 14 days.

## 2018-12-09 NOTE — Telephone Encounter (Signed)
RN [place] @ [employer] is asking for a call back from [place], she can be reached at [phone] option 1 and [phone]

## 2018-12-09 NOTE — Telephone Encounter (Signed)
I called [employer] again I was unable to get RN [place] I got RN Anderson Malta. Anderson Malta informed me that there is still an option to do a peer to peer. To do the peer to peer you would have to call [phone] option 1 [phone] that is Anderson Malta direct line to call and schedule for the peer to peer to be set up.

## 2018-12-09 NOTE — Telephone Encounter (Signed)
Tried Product/process development scientist but they close at 5pm and open at 830am Monday-Friday. I will try calling again tomorrow. I wanted to provide further findings from recent EMG/NCS to see if we could get MRI lumbar approved

## 2018-12-09 NOTE — Telephone Encounter (Signed)
The MRI was denied on my level and I also did an appeal and it is still denied. Please advise.

## 2018-12-09 NOTE — Telephone Encounter (Signed)
RN [place] @ [employer] called to inform [name] that the appeal was sent and it is still denied.  Please call

## 2018-12-10 NOTE — Telephone Encounter (Signed)
Called and spoke with Monica Malta, RN. Relayed results of EMG/NCS. They already had this on file. They are needing Dr. Felecia Shelling to do a peer to peer to try and get MRI lumbar approved. Scheduled for them to call him either 12/14/18 or 12/15/18 between 12-1pm. She states they usually call on first day but if not, they will call the second day. Gave direct contact number to Dr. Felecia Shelling for them to call  They are wanting to know if PT has been tried/failed in the past for at least 6 weeks. I reviewed chart and do not see mention of PT. They also have criteria that ESI has been tried/discuss surgery.

## 2018-12-11 ENCOUNTER — Other Ambulatory Visit: Payer: PRIVATE HEALTH INSURANCE

## 2018-12-11 ENCOUNTER — Encounter: Payer: Self-pay | Admitting: Family Medicine

## 2018-12-14 ENCOUNTER — Other Ambulatory Visit (HOSPITAL_COMMUNITY): Payer: Self-pay

## 2018-12-14 ENCOUNTER — Telehealth: Payer: Self-pay | Admitting: Neurology

## 2018-12-14 ENCOUNTER — Other Ambulatory Visit: Payer: Self-pay | Admitting: Internal Medicine

## 2018-12-14 ENCOUNTER — Telehealth: Payer: Self-pay

## 2018-12-14 DIAGNOSIS — R29898 Other symptoms and signs involving the musculoskeletal system: Secondary | ICD-10-CM

## 2018-12-14 MED ORDER — SPIRONOLACTONE 25 MG PO TABS: 25.0000 mg | ORAL_TABLET | Freq: Every day | ORAL | 5 refills | Status: DC

## 2018-12-14 MED ORDER — CYCLOBENZAPRINE HCL 5 MG PO TABS: 5.0000 mg | ORAL_TABLET | Freq: Two times a day (BID) | ORAL | 2 refills | Status: DC

## 2018-12-14 MED ORDER — LOSARTAN POTASSIUM 100 MG PO TABS: 100.0000 mg | ORAL_TABLET | Freq: Every day | ORAL | 6 refills | Status: DC

## 2018-12-14 MED ORDER — CARVEDILOL 3.125 MG PO TABS: 3.1250 mg | ORAL_TABLET | Freq: Two times a day (BID) | ORAL | 6 refills | Status: DC

## 2018-12-14 NOTE — Telephone Encounter (Signed)
Insurance would not approve MRI lumbar spine without a trial of physical therapy first.   I sent the order in.    Also, if still having the lower back and buttock pain, we can have her try flexeril 5 mg po bid   #60  #2   If she wants.  Please let her know and send in the Flexeril if she wants

## 2018-12-14 NOTE — Telephone Encounter (Signed)
Pt called and wanted to check on the status of a appeal on the MRI.

## 2018-12-14 NOTE — Addendum Note (Signed)
Addended by: Hope Pigeon on: 12/14/2018 02:14 PM   Modules accepted: Orders

## 2018-12-14 NOTE — Telephone Encounter (Signed)
I spoke to the patient and informed her that we are waiting on Dr. Felecia Shelling doing a peer to peer with her insurance.. she understood.

## 2018-12-14 NOTE — Telephone Encounter (Signed)
Noted  

## 2018-12-14 NOTE — Telephone Encounter (Signed)
Called and spoke with pt. Relayed Dr. Garth Bigness message below. She is agreeable to try PT but her deductible that she met starts back over 12/29/18 and not sure if she can afford PT after this. She will discuss further with PT.  She is agreeable to try flexeril. I escribed to [facility] for her.

## 2018-12-16 ENCOUNTER — Ambulatory Visit: Payer: PRIVATE HEALTH INSURANCE | Admitting: Neurology

## 2018-12-16 ENCOUNTER — Other Ambulatory Visit: Payer: Self-pay

## 2018-12-16 ENCOUNTER — Encounter: Payer: Self-pay | Admitting: Neurology

## 2018-12-16 VITALS — BP 115/60 | HR 69 | Temp 96.4°F | Ht 65.0 in | Wt 268.5 lb

## 2018-12-16 DIAGNOSIS — G4733 Obstructive sleep apnea (adult) (pediatric): Secondary | ICD-10-CM

## 2018-12-16 DIAGNOSIS — E1142 Type 2 diabetes mellitus with diabetic polyneuropathy: Secondary | ICD-10-CM | POA: Insufficient documentation

## 2018-12-16 DIAGNOSIS — R200 Anesthesia of skin: Secondary | ICD-10-CM | POA: Diagnosis not present

## 2018-12-16 DIAGNOSIS — M5416 Radiculopathy, lumbar region: Secondary | ICD-10-CM

## 2018-12-16 DIAGNOSIS — G5603 Carpal tunnel syndrome, bilateral upper limbs: Secondary | ICD-10-CM

## 2018-12-16 NOTE — Progress Notes (Signed)
[facility]  PATIENT: [name] DOB: 06/20/1965  REFERRING DOCTOR OR PCP:  Loura Pardon SOURCE: Patient, notes from primary care, laboratory reports, sleep reports.  _________________________________   HISTORICAL  CHIEF COMPLAINT:  Chief Complaint  Patient presents with  . Follow-up    RM 12. Last seen 09/29/2018. Going to initiate PT. MRI lumbar not covered until this tried/failed. Pt works at [employer] and can get MRI lumbar there at discounted price of 300.00 OOP. She wants to discuss with Dr. Felecia Shelling to see if she can go this route versus doing PT first.     HISTORY OF PRESENT ILLNESS:  [name], at [facility] for neurologic consultation regarding her weakness, numbness, fatigue and cognitive concerns.  Update 12/16/2018: She is wearing the carpal tunnel splints at night and she feels the hands are doing better.   She still drops things at times.     She has some electric shocks in her neck that radiate into the occiput.   MRI cervical spine showed mild DJD but no nerve root impingement.   No evidence of MS in cervical spine and brain (Mother has MS)  She has pain in the lower back and right leg.   She has a probable mild L5 or S1 radiculopathy on EMG.   We had discussed MRI but insurance will not cover.   She can get a discount rate at [employer].    EMG/NCV 11/23/2008 Impression: This NCV/EMG study shows the following: 1.   Bilateral moderate median neuropathies at the wrist (carpal tunnel syndromes) 2.   Probable mild right L5 or S1 chronic radiculopathy without active features.  I personally reviewed the MRI of the brain and cervical spine from 10/25/2018 in her presence. MRI of the brain is normal. MRI of the cervical spine shows mild degenerative changes at C3-C4, C5-C6 and C6-C7 that do not lead to nerve root compression or spinal stenosis.    From 09/29/2018: She is a 53 year old woman who has had multiple  symptoms over the past 2-3 years.   While sitting, she will sometimes gets electric shock sensatios in her neck that shoot into her head.    These occur a few times a month and just last seconds at a time.   She noted that she gets spasms in her buttocks when she walks longer distances, less so with shorter distances.   She feels her walking distance / endurance is less.    Both legs feel weak, symmetrically.   She also notes some weakness and numbness in her hands and she often drops items.    She notes mild urinary frequency but no nocturia most nights.   She has had nocturnal incontinence every 3 months or so.      She has had fatigue x many years that has worsened the past year.   She also notes difficulty with her focus and attention over the last 2 years.    She also notes reduced short term memory.     Additionally, she notes reduced processing speed.  She has apathy (chores, intimacy, organizing, etc).  She has some tearfulness.  She became tearful several times during our interaction today..   No suicidal thoughts.   She is on 150 mg Wellbutrin XL and 150 mg sertraline.   She often dozes off if not mentally stimulated, worse the past few years.    She sleeps between 1030 pm and 700 am and stays asleep fairly well.  She has OSA and is on CPAP (Dr. Lamonte Sakai but no recent appt) since 2013.Marland Kitchen   According to a 2013 note, she had ben diagnosed earlier but had not started CPAP initially.  She also has pulmonary artery hypertension and decompensated diastolic heart failure (sees Dr. Haroldine Laws).    Weight is fairly stable.    She also has Type 2 IDDM (insulin and pills)  Her mother has MS and is on Betaseron Colan Neptune sees Dr. Jannifer Franklin).     EPWORTH SLEEPINESS SCALE  On a scale of 0 - 3 what is the chance of dozing:  Sitting and Reading:   0 Watching TV:    3 Sitting inactive in a public place: 2 Passenger in car for one hour: 0 Lying down to rest in the afternoon: 3 Sitting and talking to someone: 0  Sitting quietly after lunch:  3 In a car, stopped in traffic:  0  Total (out of 24):   11/24 mild excessively sleepy   REVIEW OF SYSTEMS: Constitutional: No fevers, chills, sweats, or change in appetite.  She has fatigue and sleepiness Eyes: No visual changes, double vision, eye pain Ear, nose and throat: No hearing loss, ear pain, nasal congestion, sore throat Cardiovascular: No chest pain, palpitations.  She has diastolic dysfunction CHF Respiratory: No shortness of breath at rest or with exertion.   She has obstructive sleep apnea.  She is on CPAP GastrointestinaI: No nausea, vomiting, diarrhea, abdominal pain, fecal incontinence Genitourinary: No dysuria, urinary retention or frequency.  No nocturia. Musculoskeletal: No neck pain, back pain Integumentary: No rash, pruritus, skin lesions Neurological: as above Psychiatric: No depression at this time.  No anxiety Endocrine: No palpitations, diaphoresis, change in appetite, change in weigh or increased thirst Hematologic/Lymphatic: No anemia, purpura, petechiae. Allergic/Immunologic: No itchy/runny eyes, nasal congestion, recent allergic reactions, rashes  ALLERGIES: Allergies  Allergen Reactions  . Tape Rash    Plastic tape  . Buspirone Hcl Nausea Only  . Glipizide     REACTION: sleepy, low sugar  . Ivp Dye [Iodinated Diagnostic Agents]     Shut kidneys down  . Oxycodone-Acetaminophen Itching    HOME MEDICATIONS:  Current Outpatient Medications:  .  Albuterol Sulfate (PROAIR RESPICLICK) [id number] (90 Base) MCG/ACT AEPB, Inhale 2 puffs into the lungs every 6 (six) hours as needed for up to 6 doses., Disp: 1 each, Rfl: 1 .  Armodafinil 200 MG TABS, One po qAM, Disp: 30 tablet, Rfl: 5 .  buPROPion (WELLBUTRIN XL) 150 MG 24 hr tablet, TAKE 1 TABLET BY MOUTH EVERY DAY, Disp: 90 tablet, Rfl: 3 .  carvedilol (COREG) 3.125 MG tablet, Take 1 tablet (3.125 mg total) by mouth 2 (two) times daily with a meal., Disp: 60 tablet, Rfl: 6 .   cetirizine (ZYRTEC) 10 MG tablet, TAKE 1 TABLET BY MOUTH EVERY DAY, Disp: 90 tablet, Rfl: 1 .  Continuous Blood Gluc Sensor (FREESTYLE LIBRE 14 DAY SENSOR) MISC, 1 each by Subdermal route every 14 (fourteen) days., Disp: 2 each, Rfl: 11 .  cyclobenzaprine (FLEXERIL) 5 MG tablet, Take 1 tablet (5 mg total) by mouth 2 (two) times daily., Disp: 60 tablet, Rfl: 2 .  ferrous sulfate 325 (65 FE) MG tablet, Take 325 mg by mouth 2 (two) times daily.  , Disp: , Rfl:  .  fluticasone (FLONASE) 50 MCG/ACT nasal spray, Place 2 sprays into both nostrils daily as needed for allergies., Disp: 16 g, Rfl: 11 .  glucose blood (ONE TOUCH ULTRA TEST) test strip, Use 2x  a day, Disp: 100 each, Rfl: 2 .  Insulin Glargine, 1 Unit Dial, (TOUJEO SOLOSTAR) 300 UNIT/ML SOPN, Inject 90 Units into the skin at bedtime., Disp: 27 pen, Rfl: 1 .  Insulin Pen Needle (B-D ULTRAFINE III SHORT PEN) 31G X 8 MM MISC, USE ONCE A DAY, Disp: 100 each, Rfl: 3 .  INVOKANA 300 MG TABS tablet, TAKE 1 TABLET (300 MG TOTAL) BY MOUTH DAILY BEFORE BREAKFAST., Disp: 30 tablet, Rfl: 11 .  levothyroxine (SYNTHROID) 175 MCG tablet, TAKE 1 TABLET BY MOUTH EVERY DAY BEFORE BREAKFAST, Disp: 90 tablet, Rfl: 3 .  losartan (COZAAR) 100 MG tablet, Take 1 tablet (100 mg total) by mouth daily., Disp: 30 tablet, Rfl: 6 .  metFORMIN (GLUCOPHAGE) 1000 MG tablet, TAKE 1 TABLET BY MOUTH TWICE A DAY WITH A MEAL, Disp: 180 tablet, Rfl: 3 .  pantoprazole (PROTONIX) 40 MG tablet, TAKE 1 TABLET BY MOUTH EVERY DAY, Disp: 90 tablet, Rfl: 1 .  potassium chloride (KLOR-CON) 20 MEQ packet, Take 20 mEq by mouth as needed., Disp: , Rfl:  .  Semaglutide,0.25 or 0.5MG /DOS, (OZEMPIC, 0.25 OR 0.5 MG/DOSE,) 2 MG/1.5ML SOPN, Inject 0.5 mg into the skin once a week., Disp: 2 pen, Rfl: 5 .  sertraline (ZOLOFT) 100 MG tablet, TAKE 1.5 TABLETS BY MOUTH DAILY, Disp: 135 tablet, Rfl: 2 .  simvastatin (ZOCOR) 20 MG tablet, TAKE 1 TABLET BY MOUTH EVERY DAY IN THE EVENING, Disp: 90 tablet, Rfl:  3 .  spironolactone (ALDACTONE) 25 MG tablet, Take 1 tablet (25 mg total) by mouth daily., Disp: 30 tablet, Rfl: 5 .  torsemide (DEMADEX) 20 MG tablet, Take 1 tablet (20 mg total) by mouth daily as needed., Disp: 30 tablet, Rfl: 6 .  TRAVATAN Z 0.004 % SOLN ophthalmic solution, Place 1 drop into both eyes at bedtime. , Disp: , Rfl:  .  Vitamin D, Ergocalciferol, (DRISDOL) 1.25 MG (50000 UT) CAPS capsule, Take 1 capsule (50,000 Units total) by mouth every 7 (seven) days., Disp: 13 capsule, Rfl: 4 .  zolpidem (AMBIEN) 10 MG tablet, TAKE 1/2 TO 1 TABLET BY MOUTH AT BEDTIME AS NEEDED, Disp: 30 tablet, Rfl: 3  PAST MEDICAL HISTORY: Past Medical History:  Diagnosis Date  . Allergy    SEASONAL  . Anemia    iron deficiency  . Anxiety   . Arthritis   . Asthma    a. 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  . Cellulitis and abscess of leg 02/2014  . CHF (congestive heart failure) ([place])   . Chronic kidney disease    /13  . Complication of anesthesia    DIFFICULT WAKING- 2011  . Depression    a. Stress reaction 08/2011 in multiple social stressors.  . Diabetes mellitus   . Dysrhythmia   . Gastritis   . GERD (gastroesophageal reflux disease)   . Heart murmur   . Hemorrhoid   . Hyperlipidemia   . Hypertension   . Hyperthyroidism   . Insomnia   . Morbid obesity ([place])   . OSA (obstructive sleep apnea) 01/17/2012  . Papillary thyroid carcinoma ([place])   . PCOS (polycystic ovarian syndrome)   . Sleep apnea    C PAP  . Thyroid cancer ([place]) 2014  . Vertigo     PAST SURGICAL HISTORY: Past Surgical History:  Procedure Laterality Date  . CHOLECYSTECTOMY    . HEMORRHOID SURGERY    . INTRAUTERINE DEVICE INSERTION  2011  . SHOULDER SURGERY  06/2009  .  THYROIDECTOMY  04/10/2012   Procedure: THYROIDECTOMY;  Surgeon: Jerrell Belfast, MD;  Location: [place];  Service: ENT;  Laterality: N/A;  Total Thyroidectomy  . TONSILLECTOMY AND ADENOIDECTOMY  CHILD  . TUBAL  LIGATION      FAMILY HISTORY: Family History  Problem Relation Age of Onset  . Cancer Father        colon cancer  . Arthritis Father        osteoarthritis  . Colon cancer Father   . Colon polyps Father   . Transient ischemic attack Father   . COPD Father   . Arthritis Mother        osteoarthritis  . Asthma Mother   . Hypertension Mother   . Multiple sclerosis Mother   . Ovarian cancer Maternal Aunt   . Diabetes Paternal Aunt   . Diabetes Paternal Uncle   . Heart disease Maternal Grandfather     SOCIAL HISTORY:  Social History   Socioeconomic History  . Marital status: Married    Spouse name: Joda Hegg  . Number of children: 2  . Years of education: Some college  . Highest education level: Not on file  Occupational History  . Occupation: [employer]  Social Needs  . Financial resource strain: Not on file  . Food insecurity    Worry: Not on file    Inability: Not on file  . Transportation needs    Medical: Not on file    Non-medical: Not on file  Tobacco Use  . Smoking status: Former Smoker    Packs/day: 0.20    Years: 5.00    Pack years: 1.00    Quit date: 03/25/2004    Years since quitting: 14.7  . Smokeless tobacco: Never Used  Substance and Sexual Activity  . Alcohol use: No    Alcohol/week: 0.0 standard drinks  . Drug use: No  . Sexual activity: Yes  Lifestyle  . Physical activity    Days per week: Not on file    Minutes per session: Not on file  . Stress: Not on file  Relationships  . Social Herbalist on phone: Not on file    Gets together: Not on file    Attends religious service: Not on file    Active member of club or organization: Not on file    Attends meetings of clubs or organizations: Not on file    Relationship status: Not on file  . Intimate partner violence    Fear of current or ex partner: Not on file    Emotionally abused: Not on file    Physically abused: Not on file    Forced sexual activity: Not  on file  Other Topics Concern  . Not on file  Social History Narrative   Lives with son/husband   Caffeine use: daily   Right handed      PHYSICAL EXAM  Vitals:   12/16/18 1355  BP: 115/60  Pulse: 69  Temp: (!) 96.4 F (35.8 C)  SpO2: 98%  Weight: 268 lb 8 oz (121.8 kg)  Height: 5\' 5"  (1.651 m)    Body mass index is 44.68 kg/m.   General: The patient is well-developed and well-nourished and in no acute distress  Musculoskeletal:  Back is nontender  Neurologic Exam  Mental status: The patient is alert and oriented.   Speech is normal.  Cranial nerves: Extraocular movements are full.  There is good facial sensation to soft touch bilaterally.Facial strength is normal.  Trapezius and sternocleidomastoid strength is normal. No dysarthria is noted.  The tongue is midline, and the patient has symmetric elevation of the soft palate. No obvious hearing deficits are noted.  Motor:  Muscle bulk is normal.   Tone is normal. Strength is  5 / 5 in all 4 extremities.   Sensory: She has normal symmetric sensation to touch and vibration in the arms and knees.  She has reduced vibration sensation at the toes.    Coordination: Cerebellar testing reveals good finger-nose-finger and heel-to-shin bilaterally.  Gait and station: Station is normal.   Gait is normal.  Tandem gait is mildly wide.  Romberg is negative.   Reflexes: Deep tendon reflexes are symmetric and normal bilaterally in the arms, 1+ at the knees.  1+ at the ankles.    DIAGNOSTIC DATA (LABS, IMAGING, TESTING) - I reviewed patient records, labs, notes, testing and imaging myself where available.  Lab Results  Component Value Date   WBC 7.1 08/14/2018   HGB 12.7 08/14/2018   HCT 38.3 08/14/2018   MCV 88.9 08/14/2018   PLT 251.0 08/14/2018      Component Value Date/Time   NA 141 08/14/2018 0913   NA 138 07/09/2018 1358   K 4.2 08/14/2018 0913   CL 102 08/14/2018 0913   CO2 31 08/14/2018 0913   GLUCOSE 209 (H)  08/14/2018 0913   BUN 15 08/14/2018 0913   BUN 16 07/09/2018 1358   CREATININE 0.78 08/14/2018 0913   CALCIUM 9.3 08/14/2018 0913   PROT 6.9 08/14/2018 0913   ALBUMIN 4.2 08/14/2018 0913   AST 15 08/14/2018 0913   ALT 18 08/14/2018 0913   ALKPHOS 67 08/14/2018 0913   BILITOT 0.4 08/14/2018 0913   GFRNONAA 84 07/09/2018 1358   GFRAA 97 07/09/2018 1358   Lab Results  Component Value Date   CHOL 169 08/14/2018   HDL 36.60 (L) 08/14/2018   LDLCALC 71 06/16/2015   LDLDIRECT 100.0 08/14/2018   TRIG 278.0 (H) 08/14/2018   CHOLHDL 5 08/14/2018   Lab Results  Component Value Date   HGBA1C 10.2 (A) 11/27/2018   Lab Results  Component Value Date   VITAMINB12 350 09/29/2018   Lab Results  Component Value Date   TSH 20.95 (H) 08/14/2018       ASSESSMENT AND PLAN  1. Bilateral carpal tunnel syndrome   2. Numbness   3. Lumbar radiculopathy   4. OSA (obstructive sleep apnea) just got cpap at home this past week 01/17/12   5. Diabetic polyneuropathy associated with type 2 diabetes mellitus ([place])      1.   She will continue to wear the carpal tunnel wrist splints.  If worsens, we can refer to Ortho for CTR 2.   We will check MRI lumbar at her place of employment 3.   Continue armodafinil for hypersomnia associated with OSA 4.   We discussed LBP/neck exercises and she will try these.  If not better, consider adding gabapentin. 5.   rtc 6 months  Francella Barnett A. Felecia Shelling, MD, [facility] [id number], [id number] PM Certified in Neurology, Clinical Neurophysiology, Sleep Medicine and Neuroimaging  [facility] [address] [place], [place] [address] [phone]

## 2018-12-31 ENCOUNTER — Telehealth: Payer: Self-pay | Admitting: *Deleted

## 2018-12-31 NOTE — Telephone Encounter (Signed)
Received cpap supplies order form. Dr. Felecia Shelling reviewed. I called [employer] and confirmed PAP pressure setting. Pt is on 10-20 cm. Orders faxed back to [employer]. Received a receipt of confirmation.

## 2019-01-15 ENCOUNTER — Encounter: Payer: Self-pay | Admitting: Family Medicine

## 2019-01-17 MED ORDER — FLUCONAZOLE 150 MG PO TABS: 150.0000 mg | ORAL_TABLET | Freq: Once | ORAL | 0 refills | Status: AC

## 2019-02-23 LAB — HM DIABETES EYE EXAM

## 2019-02-24 ENCOUNTER — Telehealth: Payer: Self-pay | Admitting: Neurology

## 2019-02-24 NOTE — Telephone Encounter (Signed)
Patient called to check in, in regards to the MRI pre-cert for her to be able to get her imaging done at [facility] imaging. Due to today being the 6th week being on muscle relaxer's with no release.  Please follow up

## 2019-02-24 NOTE — Telephone Encounter (Signed)
Called, LVM for pt to call office back and further discuss.   Per last OV note: "Going to initiate PT. MRI lumbar not covered until this tried/failed. Pt works at [employer] and can get MRI lumbar there at discounted price of 300.00 OOP."

## 2019-02-24 NOTE — Telephone Encounter (Signed)
Danielle/Dana- can you f/u with her on this? Thank you

## 2019-02-24 NOTE — Telephone Encounter (Signed)
Per apt notes this MRI was denied. Looks like per office notes in September you spoke to the nurse reviewer who said a P2P would need to be completed by Dr. Felecia Shelling. I do not see where one was completed.

## 2019-02-25 NOTE — Telephone Encounter (Signed)
Pt called back. She did try to get MRI at place of work but MRI machine too small for her.  Her breasts would not fit via machine and was told they would not receive a good image. Since, she she has been doing heat/OTC meds. She has been doing home exercises from PT pamphlets she received but never initiated PT. Advised that per previous notes she would need to complete PT first prior to MRI being covered by insurance. She will call PT at [phone] to try and schedule appt. She will call back if anything further needed.

## 2019-03-04 ENCOUNTER — Encounter: Payer: Self-pay | Admitting: Family Medicine

## 2019-03-05 ENCOUNTER — Ambulatory Visit: Payer: PRIVATE HEALTH INSURANCE | Admitting: Internal Medicine

## 2019-03-15 LAB — HM DIABETES EYE EXAM

## 2019-03-31 ENCOUNTER — Other Ambulatory Visit: Payer: Self-pay | Admitting: Family Medicine

## 2019-03-31 NOTE — Telephone Encounter (Signed)
Last filled on 10/22/2018 330 with 3 refills LOV 08/10/2018 and no future appointments scheduled.

## 2019-04-19 ENCOUNTER — Other Ambulatory Visit: Payer: Self-pay | Admitting: Neurology

## 2019-04-21 ENCOUNTER — Other Ambulatory Visit: Payer: Self-pay | Admitting: Family Medicine

## 2019-04-27 ENCOUNTER — Other Ambulatory Visit: Payer: Self-pay

## 2019-04-27 ENCOUNTER — Other Ambulatory Visit: Payer: Self-pay | Admitting: Neurology

## 2019-04-27 MED ORDER — PANTOPRAZOLE SODIUM 40 MG PO TBEC: 40.0000 mg | DELAYED_RELEASE_TABLET | Freq: Every day | ORAL | 0 refills | Status: DC

## 2019-04-30 ENCOUNTER — Encounter: Payer: Self-pay | Admitting: Family Medicine

## 2019-04-30 ENCOUNTER — Ambulatory Visit: Payer: PRIVATE HEALTH INSURANCE | Admitting: Internal Medicine

## 2019-05-19 ENCOUNTER — Other Ambulatory Visit: Payer: Self-pay | Admitting: Family Medicine

## 2019-06-14 ENCOUNTER — Telehealth: Payer: Self-pay | Admitting: Neurology

## 2019-06-14 NOTE — Telephone Encounter (Signed)
I called patient regarding confirming 3/23 appointment. Patient states she would like to cancel at this time and call back to reschedule.

## 2019-06-15 ENCOUNTER — Ambulatory Visit: Payer: PRIVATE HEALTH INSURANCE | Admitting: Neurology

## 2019-06-29 ENCOUNTER — Other Ambulatory Visit: Payer: Self-pay | Admitting: Family Medicine

## 2019-07-14 ENCOUNTER — Other Ambulatory Visit: Payer: Self-pay | Admitting: Family Medicine

## 2019-07-14 NOTE — Telephone Encounter (Signed)
Please schedule a summer physical and refill until then

## 2019-07-14 NOTE — Telephone Encounter (Signed)
Pt hasn't been seen in almost a year. Please advise

## 2019-07-14 NOTE — Telephone Encounter (Signed)
Med refilled once and Carrie will reach out to pt to try and get CPE scheduled  

## 2019-07-20 ENCOUNTER — Other Ambulatory Visit: Payer: Self-pay | Admitting: Family Medicine

## 2019-07-27 ENCOUNTER — Other Ambulatory Visit: Payer: Self-pay | Admitting: Family Medicine

## 2019-08-03 ENCOUNTER — Encounter: Payer: Self-pay | Admitting: Family Medicine

## 2019-08-09 ENCOUNTER — Encounter: Payer: Self-pay | Admitting: Family Medicine

## 2019-08-15 ENCOUNTER — Other Ambulatory Visit: Payer: Self-pay | Admitting: Family Medicine

## 2019-08-20 ENCOUNTER — Other Ambulatory Visit: Payer: Self-pay | Admitting: Family Medicine

## 2019-08-20 ENCOUNTER — Other Ambulatory Visit: Payer: Self-pay | Admitting: Neurology

## 2019-08-20 NOTE — Telephone Encounter (Signed)
Name of Medication: Ambien Name of Pharmacy: [facility]/ [place] or Written Date and Quantity: 03/31/19 #30 tabs with 3 refills Last Office Visit and Type: Doxy F/U on 08/10/18 Next Office Visit and Type: CPE on 10/01/19

## 2019-08-25 ENCOUNTER — Encounter: Payer: Self-pay | Admitting: Family Medicine

## 2019-09-20 ENCOUNTER — Other Ambulatory Visit: Payer: Self-pay | Admitting: Neurology

## 2019-09-20 ENCOUNTER — Other Ambulatory Visit: Payer: Self-pay | Admitting: Internal Medicine

## 2019-09-20 ENCOUNTER — Other Ambulatory Visit: Payer: Self-pay | Admitting: Family Medicine

## 2019-09-27 ENCOUNTER — Telehealth: Payer: Self-pay | Admitting: Family Medicine

## 2019-09-27 DIAGNOSIS — E559 Vitamin D deficiency, unspecified: Secondary | ICD-10-CM

## 2019-09-27 DIAGNOSIS — E1169 Type 2 diabetes mellitus with other specified complication: Secondary | ICD-10-CM

## 2019-09-27 DIAGNOSIS — D509 Iron deficiency anemia, unspecified: Secondary | ICD-10-CM

## 2019-09-27 DIAGNOSIS — E890 Postprocedural hypothyroidism: Secondary | ICD-10-CM

## 2019-09-27 DIAGNOSIS — I10 Essential (primary) hypertension: Secondary | ICD-10-CM

## 2019-09-27 DIAGNOSIS — IMO0002 Reserved for concepts with insufficient information to code with codable children: Secondary | ICD-10-CM

## 2019-09-27 DIAGNOSIS — E1142 Type 2 diabetes mellitus with diabetic polyneuropathy: Secondary | ICD-10-CM

## 2019-09-27 DIAGNOSIS — E1165 Type 2 diabetes mellitus with hyperglycemia: Secondary | ICD-10-CM

## 2019-09-27 DIAGNOSIS — E782 Mixed hyperlipidemia: Secondary | ICD-10-CM

## 2019-09-27 NOTE — Telephone Encounter (Signed)
-----   Message from Ellamae Sia sent at 09/13/2019  3:11 PM EDT ----- Regarding: Lab orders for Tuesday, 7.6.21 Patient is scheduled for CPX labs, please order future labs, Thanks , Karna Christmas

## 2019-09-28 ENCOUNTER — Other Ambulatory Visit: Payer: PRIVATE HEALTH INSURANCE

## 2019-09-28 ENCOUNTER — Other Ambulatory Visit: Payer: Self-pay

## 2019-09-29 ENCOUNTER — Other Ambulatory Visit (INDEPENDENT_AMBULATORY_CARE_PROVIDER_SITE_OTHER): Payer: PRIVATE HEALTH INSURANCE

## 2019-09-29 ENCOUNTER — Other Ambulatory Visit: Payer: Self-pay

## 2019-09-29 DIAGNOSIS — E782 Mixed hyperlipidemia: Secondary | ICD-10-CM

## 2019-09-29 DIAGNOSIS — E559 Vitamin D deficiency, unspecified: Secondary | ICD-10-CM

## 2019-09-29 DIAGNOSIS — D509 Iron deficiency anemia, unspecified: Secondary | ICD-10-CM

## 2019-09-29 DIAGNOSIS — I10 Essential (primary) hypertension: Secondary | ICD-10-CM | POA: Diagnosis not present

## 2019-09-29 DIAGNOSIS — E890 Postprocedural hypothyroidism: Secondary | ICD-10-CM

## 2019-09-29 LAB — COMPREHENSIVE METABOLIC PANEL
ALT: 23 U/L (ref 0–35)
AST: 16 U/L (ref 0–37)
Albumin: 4.4 g/dL (ref 3.5–5.2)
Alkaline Phosphatase: 82 U/L (ref 39–117)
BUN: 13 mg/dL (ref 6–23)
CO2: 27 mEq/L (ref 19–32)
Calcium: 9.2 mg/dL (ref 8.4–10.5)
Chloride: 100 mEq/L (ref 96–112)
Creatinine, Ser: 0.75 mg/dL (ref 0.40–1.20)
GFR: 80.55 mL/min (ref >60.00)
Glucose, Bld: 287 mg/dL — ABNORMAL HIGH (ref 70–99)
Potassium: 4.2 mEq/L (ref 3.5–5.1)
Sodium: 136 mEq/L (ref 135–145)
Total Bilirubin: 0.3 mg/dL (ref 0.2–1.2)
Total Protein: 6.6 g/dL (ref 6.0–8.3)

## 2019-09-29 LAB — CBC WITH DIFFERENTIAL/PLATELET
Basophils Absolute: 0.1 10*3/uL (ref 0.0–0.1)
Basophils Relative: 0.8 % (ref 0.0–3.0)
Eosinophils Absolute: 0.2 10*3/uL (ref 0.0–0.7)
Eosinophils Relative: 3.1 % (ref 0.0–5.0)
HCT: 41.9 % (ref 36.0–46.0)
Hemoglobin: 13.8 g/dL (ref 12.0–15.0)
Lymphocytes Relative: 24.8 % (ref 12.0–46.0)
Lymphs Abs: 2 10*3/uL (ref 0.7–4.0)
MCHC: 33 g/dL (ref 30.0–36.0)
MCV: 90.5 fl (ref 78.0–100.0)
Monocytes Absolute: 0.4 10*3/uL (ref 0.1–1.0)
Monocytes Relative: 5.3 % (ref 3.0–12.0)
Neutro Abs: 5.3 10*3/uL (ref 1.4–7.7)
Neutrophils Relative %: 66 % (ref 43.0–77.0)
Platelets: 229 10*3/uL (ref 150.0–400.0)
RBC: 4.63 Mil/uL (ref 3.87–5.11)
RDW: 14.6 % (ref 11.5–15.5)
WBC: 8 10*3/uL (ref 4.0–10.5)

## 2019-09-29 LAB — LIPID PANEL
Cholesterol: 241 mg/dL — ABNORMAL HIGH (ref 0–200)
HDL: 40.5 mg/dL (ref >39.00)
Total CHOL/HDL Ratio: 6
Triglycerides: 557 mg/dL — ABNORMAL HIGH (ref 0.0–149.0)

## 2019-09-29 LAB — LDL CHOLESTEROL, DIRECT: Direct LDL: 140 mg/dL

## 2019-09-29 LAB — VITAMIN D 25 HYDROXY (VIT D DEFICIENCY, FRACTURES): VITD: 26.43 ng/mL — ABNORMAL LOW (ref 30.00–100.00)

## 2019-09-29 LAB — TSH: TSH: 23.95 u[IU]/mL — ABNORMAL HIGH (ref 0.35–4.50)

## 2019-10-01 ENCOUNTER — Ambulatory Visit (INDEPENDENT_AMBULATORY_CARE_PROVIDER_SITE_OTHER): Payer: PRIVATE HEALTH INSURANCE | Admitting: Family Medicine

## 2019-10-01 ENCOUNTER — Encounter: Payer: Self-pay | Admitting: Family Medicine

## 2019-10-01 ENCOUNTER — Other Ambulatory Visit: Payer: Self-pay

## 2019-10-01 VITALS — BP 132/84 | HR 84 | Temp 97.3°F | Ht 65.25 in | Wt 264.0 lb

## 2019-10-01 DIAGNOSIS — E890 Postprocedural hypothyroidism: Secondary | ICD-10-CM

## 2019-10-01 DIAGNOSIS — I10 Essential (primary) hypertension: Secondary | ICD-10-CM

## 2019-10-01 DIAGNOSIS — G4733 Obstructive sleep apnea (adult) (pediatric): Secondary | ICD-10-CM

## 2019-10-01 DIAGNOSIS — E1142 Type 2 diabetes mellitus with diabetic polyneuropathy: Secondary | ICD-10-CM | POA: Diagnosis not present

## 2019-10-01 DIAGNOSIS — Z0000 Encounter for general adult medical examination without abnormal findings: Secondary | ICD-10-CM | POA: Diagnosis not present

## 2019-10-01 DIAGNOSIS — E559 Vitamin D deficiency, unspecified: Secondary | ICD-10-CM

## 2019-10-01 DIAGNOSIS — E1165 Type 2 diabetes mellitus with hyperglycemia: Secondary | ICD-10-CM

## 2019-10-01 DIAGNOSIS — I5032 Chronic diastolic (congestive) heart failure: Secondary | ICD-10-CM

## 2019-10-01 DIAGNOSIS — F418 Other specified anxiety disorders: Secondary | ICD-10-CM

## 2019-10-01 DIAGNOSIS — E249 Cushing's syndrome, unspecified: Secondary | ICD-10-CM

## 2019-10-01 DIAGNOSIS — K219 Gastro-esophageal reflux disease without esophagitis: Secondary | ICD-10-CM

## 2019-10-01 DIAGNOSIS — IMO0002 Reserved for concepts with insufficient information to code with codable children: Secondary | ICD-10-CM

## 2019-10-01 MED ORDER — CETIRIZINE HCL 10 MG PO TABS: 10.0000 mg | ORAL_TABLET | Freq: Every day | ORAL | 3 refills | Status: DC

## 2019-10-01 MED ORDER — PANTOPRAZOLE SODIUM 40 MG PO TBEC: 40.0000 mg | DELAYED_RELEASE_TABLET | Freq: Every day | ORAL | 3 refills | Status: DC

## 2019-10-01 MED ORDER — SIMVASTATIN 20 MG PO TABS: ORAL_TABLET | ORAL | 3 refills | Status: DC

## 2019-10-01 MED ORDER — SERTRALINE HCL 100 MG PO TABS: ORAL_TABLET | ORAL | 3 refills | Status: DC

## 2019-10-01 MED ORDER — FLUCONAZOLE 150 MG PO TABS
150.0000 mg | ORAL_TABLET | Freq: Once | ORAL | 0 refills | Status: AC
Start: 2019-10-01 — End: 2019-10-01

## 2019-10-01 MED ORDER — FLUTICASONE PROPIONATE 50 MCG/ACT NA SUSP: 2.0000 | Freq: Every day | NASAL | 11 refills | Status: DC | PRN

## 2019-10-01 MED ORDER — BUPROPION HCL ER (XL) 150 MG PO TB24: 150.0000 mg | ORAL_TABLET | Freq: Every day | ORAL | 3 refills | Status: DC

## 2019-10-01 NOTE — Patient Instructions (Addendum)
Call [facility] to schedule your mammogram and visit   Eat oranges instead of drinking OJ   Please make your appt with endocrinology   For cholesterol /triglycerides Avoid red meat/ fried foods/ egg yolks/ fatty breakfast meats/ butter, cheese and high fat dairy/ and shellfish  Also watch blood sugar-makes triglycerides high   Take diflucan for yeast infection and follow up with gyn  Skip your cholesterol medicine the day you take it

## 2019-10-01 NOTE — Progress Notes (Signed)
Subjective:    Patient ID: [name], female    DOB: 1966-02-04, 54 y.o.   MRN: [id number]  This visit occurred during the SARS-CoV-2 public health emergency.  Safety protocols were in place, including screening questions prior to the visit, additional usage of staff PPE, and extensive cleaning of exam room while observing appropriate contact time as indicated for disinfecting solutions.    HPI Here for health maintenance exam and to review chronic medical problems   Wt Readings from Last 3 Encounters:  10/01/19 264 lb (119.7 kg)  12/16/18 268 lb 8 oz (121.8 kg)  11/27/18 274 lb (124.3 kg)  down 4 lb Trying to loose  Not a lot of appetite - just stays on a schedule to eat (but has craved OJ)  43.60 kg/m    Had a rough year- husband was sick - covid and other things  She then contracted covid   Hep C screen -declines/low risk  Mammogram 2/18- at phys Self exam - no lumps   Pap 2/18 - was normal  Will f/u with gyn for this and mammogram  Flu shot 9/20  pna completed  Tdap 12/13 covid status - immunized   Colonoscopy 6/17 with 5 y recall  fam hx of colon cancer- father    HTN bp is stable today  No cp or palpitations or headaches or edema  No side effects to medicines  BP Readings from Last 3 Encounters:  10/01/19 132/84  12/16/18 115/60  11/27/18 638/46      H/o diastolic HF as well as OSA with cpap  Diabetes-2 and Cushing's syndrome  Sees Dr Cruzita Lederer Not in good control  Is due to see her  Eye exam nl in Dec   Hypothyroid (with h/o thyroid cancer)  Hypothyroidism  Pt has no clinical changes No change in energy level/ hair or skin/ edema and no tremor Lab Results  Component Value Date   TSH 23.95 (H) 09/29/2019    Sees Dr Cruzita Lederer as well  Vit D def level 26.4  No anemia today Lab Results  Component Value Date   WBC 8.0 09/29/2019   HGB 13.8 09/29/2019   HCT 41.9 09/29/2019   MCV 90.5 09/29/2019   PLT 229.0 09/29/2019   Other  labs Lab Results  Component Value Date   CREATININE 0.75 09/29/2019   BUN 13 09/29/2019   NA 136 09/29/2019   K 4.2 09/29/2019   CL 100 09/29/2019   CO2 27 09/29/2019   Lab Results  Component Value Date   ALT 23 09/29/2019   AST 16 09/29/2019   ALKPHOS 82 09/29/2019   BILITOT 0.3 09/29/2019  glucose 287  Cholesterol  Lab Results  Component Value Date   CHOL 241 (H) 09/29/2019   CHOL 169 08/14/2018   CHOL 154 08/01/2017   Lab Results  Component Value Date   HDL 40.50 09/29/2019   HDL 36.60 (L) 08/14/2018   HDL 32.70 (L) 08/01/2017   Lab Results  Component Value Date   LDLCALC 71 06/16/2015   LDLCALC 91 12/20/2013   LDLCALC 78 01/17/2012   Lab Results  Component Value Date   TRIG (H) 09/29/2019    557.0 Triglyceride is over 400; calculations on Lipids are invalid.   TRIG 278.0 (H) 08/14/2018   TRIG 227.0 (H) 08/01/2017   Lab Results  Component Value Date   CHOLHDL 6 09/29/2019   CHOLHDL 5 08/14/2018   CHOLHDL 5 08/01/2017   Lab Results  Component Value Date  LDLDIRECT 140.0 09/29/2019   LDLDIRECT 100.0 08/14/2018   LDLDIRECT 103.0 08/01/2017   Thinks she has a yeast infection /vaginal  Has not used a topical   Patient Active Problem List   Diagnosis Date Noted  . Vitamin D deficiency 09/27/2019  . Lumbar radiculopathy 12/16/2018  . Diabetes, polyneuropathy ([place]) 12/16/2018  . Bilateral carpal tunnel syndrome 11/24/2018  . Memory loss 09/29/2018  . Numbness 09/29/2018  . Family history of MS (multiple sclerosis) 09/29/2018  . Leg weakness, bilateral 08/10/2018  . Routine general medical examination at a health care facility 12/19/2013  . Palpitations 02/03/2013  . Nonspecific abnormal toxicology 10/29/2012  . 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 History of thyroid cancer 04/27/2012  . Cushing's syndrome ([place]) 04/27/2012  . Chronic diastolic heart failure ([place]) 02/10/2012  . Diastolic CHF ([place]) 97/04/6376  . OSA (obstructive sleep apnea)  just got cpap at home this past week 01/17/12 01/17/2012  . Chronic allergic rhinitis 10/26/2010  . Insomnia 10/26/2010  . OBESITY, MORBID 04/27/2010  . HEMORRHOIDS, INTERNAL, WITH BLEEDING 08/17/2009  . GERD 04/27/2008  . Essential hypertension 11/03/2007  . Hyperlipidemia associated with type 2 diabetes mellitus ([employer]) 05/14/2007  . OTHER ORGANIC SLEEP DISORDERS 03/09/2007  . Uncontrolled type 2 diabetes mellitus with peripheral neuropathy ([place]) 09/11/2006  . POLYCYSTIC OVARIES 09/11/2006  . Iron deficiency anemia 09/11/2006  . Depression with anxiety 09/11/2006  . EXTERNAL HEMORRHOIDS 09/11/2006  . Asthma 09/11/2006   Past Medical History:  Diagnosis Date  . Allergy    SEASONAL  . Anemia    iron deficiency  . Anxiety   . Arthritis   . Asthma    a. 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  . Cellulitis and abscess of leg 02/2014  . CHF (congestive heart failure) ([place])   . Chronic kidney disease    /13  . Complication of anesthesia    DIFFICULT WAKING- 2011  . Depression    a. Stress reaction 08/2011 in multiple social stressors.  . Diabetes mellitus   . Dysrhythmia   . Gastritis   . GERD (gastroesophageal reflux disease)   . Heart murmur   . Hemorrhoid   . Hyperlipidemia   . Hypertension   . Hyperthyroidism   . Insomnia   . Morbid obesity ([place])   . OSA (obstructive sleep apnea) 01/17/2012  . Papillary thyroid carcinoma ([place])   . PCOS (polycystic ovarian syndrome)   . Sleep apnea    C PAP  . Thyroid cancer ([place]) 2014  . Vertigo    Past Surgical History:  Procedure Laterality Date  . CHOLECYSTECTOMY    . HEMORRHOID SURGERY    . INTRAUTERINE DEVICE INSERTION  2011  . SHOULDER SURGERY  06/2009  . THYROIDECTOMY  04/10/2012   Procedure: THYROIDECTOMY;  Surgeon: Jerrell Belfast, MD;  Location: [facility];  Service: ENT;  Laterality: N/A;  Total Thyroidectomy  . TONSILLECTOMY AND ADENOIDECTOMY  CHILD  . TUBAL LIGATION     Social  History   Tobacco Use  . Smoking status: Former Smoker    Packs/day: 0.20    Years: 5.00    Pack years: 1.00    Quit date: 03/25/2004    Years since quitting: 15.5  . Smokeless tobacco: Never Used  Substance Use Topics  . Alcohol use: No    Alcohol/week: 0.0 standard drinks  . Drug use: No   Family History  Problem Relation Age of Onset  . Cancer Father  colon cancer  . Arthritis Father        osteoarthritis  . Colon cancer Father   . Colon polyps Father   . Transient ischemic attack Father   . COPD Father   . Arthritis Mother        osteoarthritis  . Asthma Mother   . Hypertension Mother   . Multiple sclerosis Mother   . Ovarian cancer Maternal Aunt   . Diabetes Paternal Aunt   . Diabetes Paternal Uncle   . Heart disease Maternal Grandfather    Allergies  Allergen Reactions  . Tape Rash    Plastic tape  . Buspirone Hcl Nausea Only  . Glipizide     REACTION: sleepy, low sugar  . Ivp Dye [Iodinated Diagnostic Agents]     Shut kidneys down  . Oxycodone-Acetaminophen Itching   Current Outpatient Medications on File Prior to Visit  Medication Sig Dispense Refill  . Albuterol Sulfate (PROAIR RESPICLICK) 413 (90 Base) MCG/ACT AEPB Inhale 2 puffs into the lungs every 6 (six) hours as needed for up to 6 doses. 1 each 1  . Armodafinil 200 MG TABS TAKE 1 TABLET BY MOUTH EVERY DAY IN THE MORNING 30 tablet 5  . carvedilol (COREG) 3.125 MG tablet Take 1 tablet (3.125 mg total) by mouth 2 (two) times daily with a meal. 60 tablet 6  . Continuous Blood Gluc Sensor (FREESTYLE LIBRE 14 DAY SENSOR) MISC 1 each by Subdermal route every 14 (fourteen) days. 2 each 11  . cyclobenzaprine (FLEXERIL) 5 MG tablet TAKE 1 TABLET BY MOUTH TWICE A DAY 60 tablet 0  . ferrous sulfate 325 (65 FE) MG tablet Take 325 mg by mouth 2 (two) times daily.      Marland Kitchen glucose blood (ONE TOUCH ULTRA TEST) test strip Use 2x a day 100 each 2  . Insulin Glargine, 1 Unit Dial, (TOUJEO SOLOSTAR) 300 UNIT/ML  SOPN Inject 90 Units into the skin at bedtime. 27 pen 1  . Insulin Pen Needle (B-D ULTRAFINE III SHORT PEN) 31G X 8 MM MISC USE ONCE A DAY 100 each 3  . INVOKANA 300 MG TABS tablet TAKE 1 TABLET (300 MG TOTAL) BY MOUTH DAILY BEFORE BREAKFAST. 30 tablet 11  . levothyroxine (SYNTHROID) 175 MCG tablet TAKE 1 TABLET BY MOUTH EVERY DAY BEFORE BREAKFAST 90 tablet 3  . metFORMIN (GLUCOPHAGE) 1000 MG tablet TAKE 1 TABLET BY MOUTH TWICE A DAY WITH A MEAL 180 tablet 3  . potassium chloride (KLOR-CON) 20 MEQ packet Take 20 mEq by mouth as needed.    Marland Kitchen spironolactone (ALDACTONE) 25 MG tablet Take 1 tablet (25 mg total) by mouth daily. 30 tablet 5  . torsemide (DEMADEX) 20 MG tablet Take 1 tablet (20 mg total) by mouth daily as needed. 30 tablet 6  . TRAVATAN Z 0.004 % SOLN ophthalmic solution Place 1 drop into both eyes at bedtime.     . Vitamin D, Ergocalciferol, (DRISDOL) 1.25 MG (50000 UT) CAPS capsule Take 1 capsule (50,000 Units total) by mouth every 7 (seven) days. 13 capsule 4  . zolpidem (AMBIEN) 10 MG tablet TAKE 1/2 TO 1 TABLET BY MOUTH AT BEDTIME AS NEEDED 30 tablet 3   No current facility-administered medications on file prior to visit.     Review of Systems  Constitutional: Negative for activity change, appetite change, fatigue, fever and unexpected weight change.  HENT: Negative for congestion, ear pain, rhinorrhea, sinus pressure and sore throat.   Eyes: Negative for pain, redness and visual disturbance.  Respiratory: Negative for cough, shortness of breath and wheezing.   Cardiovascular: Negative for chest pain and palpitations.  Gastrointestinal: Negative for abdominal pain, blood in stool, constipation and diarrhea.  Endocrine: Negative for polydipsia and polyuria.  Genitourinary: Positive for vaginal discharge. Negative for dysuria, frequency and urgency.       Pt needs tx for yeast vaginitis  This is frequent with her uncontrolled DM  Musculoskeletal: Negative for arthralgias, back  pain and myalgias.  Skin: Negative for pallor and rash.  Allergic/Immunologic: Negative for environmental allergies.  Neurological: Negative for dizziness, syncope and headaches.  Hematological: Negative for adenopathy. Does not bruise/bleed easily.  Psychiatric/Behavioral: Negative for decreased concentration and dysphoric mood. The patient is nervous/anxious.        Stressors       Objective:   Physical Exam Constitutional:      General: She is not in acute distress.    Appearance: Normal appearance. She is well-developed. She is obese. She is not ill-appearing or diaphoretic.  HENT:     Head: Normocephalic and atraumatic.     Comments: Cushingoid face    Right Ear: Tympanic membrane, ear canal and external ear normal.     Left Ear: Tympanic membrane, ear canal and external ear normal.     Nose: Nose normal. No congestion.     Mouth/Throat:     Mouth: Mucous membranes are moist.     Pharynx: Oropharynx is clear. No posterior oropharyngeal erythema.  Eyes:     General: No scleral icterus.    Extraocular Movements: Extraocular movements intact.     Conjunctiva/sclera: Conjunctivae normal.     Pupils: Pupils are equal, round, and reactive to light.  Neck:     Thyroid: No thyromegaly.     Vascular: No carotid bruit or JVD.  Cardiovascular:     Rate and Rhythm: Normal rate and regular rhythm.     Pulses: Normal pulses.     Heart sounds: Normal heart sounds. No gallop.   Pulmonary:     Effort: Pulmonary effort is normal. No respiratory distress.     Breath sounds: Normal breath sounds. No wheezing.     Comments: Good air exch Chest:     Chest wall: No tenderness.  Abdominal:     General: Bowel sounds are normal. There is no distension or abdominal bruit.     Palpations: Abdomen is soft. There is no mass.     Tenderness: There is no abdominal tenderness.     Hernia: No hernia is present.  Genitourinary:    Comments: Breast exam: No mass, nodules, thickening, tenderness,  bulging, retraction, inflamation, nipple discharge or skin changes noted.  No axillary or clavicular LA.     Musculoskeletal:        General: No tenderness. Normal range of motion.     Cervical back: Normal range of motion and neck supple. No rigidity. No muscular tenderness.     Right lower leg: No edema.     Left lower leg: No edema.  Lymphadenopathy:     Cervical: No cervical adenopathy.  Skin:    General: Skin is warm and dry.     Coloration: Skin is not pale.     Findings: No erythema or rash.     Comments: Solar lentigines diffusely   Neurological:     Mental Status: She is alert. Mental status is at baseline.     Cranial Nerves: No cranial nerve deficit.     Motor: No abnormal muscle tone.  Coordination: Coordination normal.     Gait: Gait normal.     Deep Tendon Reflexes: Reflexes are normal and symmetric. Reflexes normal.  Psychiatric:        Mood and Affect: Mood normal.        Cognition and Memory: Cognition and memory normal.           Assessment & Plan:   Problem List Items Addressed This Visit      Cardiovascular and Mediastinum   Essential hypertension    bp in fair control at this time  BP Readings from Last 1 Encounters:  10/01/19 132/84   No changes needed Most recent labs reviewed  Disc lifstyle change with low sodium diet and exercise        Relevant Medications   simvastatin (ZOCOR) 20 MG tablet   Chronic diastolic heart failure (HCC)    No symptoms currently - controlled       Relevant Medications   simvastatin (ZOCOR) 20 MG tablet     Respiratory   OSA (obstructive sleep apnea) just got cpap at home this past week 01/17/12    Continues cpap        Digestive   GERD    Continues protonix      Relevant Medications   pantoprazole (PROTONIX) 40 MG tablet     Endocrine   Uncontrolled type 2 diabetes mellitus with peripheral neuropathy (HCC) (Chronic)    Overdue to see endocrinology-she will schedule Suspect not well  controlled  Nl eye exam in sept  Nl foot exam        Relevant Medications   buPROPion (WELLBUTRIN XL) 150 MG 24 hr tablet   sertraline (ZOLOFT) 100 MG tablet   simvastatin (ZOCOR) 20 MG tablet   Postsurgical hypothyroidism    Lab Results  Component Value Date   TSH 23.95 (H) 09/29/2019   For f/u with Dr Cruzita Lederer -will need adj in tx      Cushing's syndrome ([place])    Pt sees endocrinology Urged to make a f/u appt      Diabetes, polyneuropathy ([place])    Was able to feel monofilament on foot exam today      Relevant Medications   buPROPion (WELLBUTRIN XL) 150 MG 24 hr tablet   sertraline (ZOLOFT) 100 MG tablet   simvastatin (ZOCOR) 20 MG tablet     Other   Depression with anxiety    Doing well with sertraline despite inc stressors this past year Reviewed stressors/ coping techniques/symptoms/ support sources/ tx options and side effects in detail today       Relevant Medications   buPROPion (WELLBUTRIN XL) 150 MG 24 hr tablet   sertraline (ZOLOFT) 100 MG tablet   OBESITY, MORBID    Discussed how this problem influences overall health and the risks it imposes  Reviewed plan for weight loss with lower calorie diet (via better food choices and also portion control or program like weight watchers) and exercise building up to or more than 30 minutes 5 days per week including some aerobic activity         Routine general medical examination at a health care facility - Primary    Reviewed health habits including diet and exercise and skin cancer prevention Reviewed appropriate screening tests for age  Also reviewed health mt list, fam hx and immunization status , as well as social and family history   See HPI Labs reviewed  Wt loss encouraged  Overdue for mammogram-she will schedule this at gyn  office alone with a visit  covid immunized  Colonoscopy utd (father had colon cancer)         Vitamin D deficiency    D level is 26.4 High dose tx in the past Will continue  what she is taking and disc with endocrinology Will need adj

## 2019-10-02 NOTE — Assessment & Plan Note (Signed)
Pt sees endocrinology Urged to make a f/u appt

## 2019-10-02 NOTE — Assessment & Plan Note (Signed)
Continues protonix. 

## 2019-10-02 NOTE — Assessment & Plan Note (Signed)
Continues cpap.  

## 2019-10-02 NOTE — Assessment & Plan Note (Signed)
Discussed how this problem influences overall health and the risks it imposes  Reviewed plan for weight loss with lower calorie diet (via better food choices and also portion control or program like weight watchers) and exercise building up to or more than 30 minutes 5 days per week including some aerobic activity    

## 2019-10-02 NOTE — Assessment & Plan Note (Signed)
Doing well with sertraline despite inc stressors this past year Reviewed stressors/ coping techniques/symptoms/ support sources/ tx options and side effects in detail today

## 2019-10-02 NOTE — Assessment & Plan Note (Addendum)
D level is 26.4 High dose tx in the past Will continue what she is taking and disc with endocrinology Will need adj

## 2019-10-02 NOTE — Assessment & Plan Note (Signed)
Reviewed health habits including diet and exercise and skin cancer prevention Reviewed appropriate screening tests for age  Also reviewed health mt list, fam hx and immunization status , as well as social and family history   See HPI Labs reviewed  Wt loss encouraged  Overdue for mammogram-she will schedule this at gyn office alone with a visit  covid immunized  Colonoscopy utd (father had colon cancer)

## 2019-10-02 NOTE — Assessment & Plan Note (Signed)
No symptoms currently controlled

## 2019-10-02 NOTE — Assessment & Plan Note (Signed)
bp in fair control at this time  BP Readings from Last 1 Encounters:  10/01/19 132/84   No changes needed Most recent labs reviewed  Disc lifstyle change with low sodium diet and exercise

## 2019-10-02 NOTE — Assessment & Plan Note (Signed)
Lab Results  Component Value Date   TSH 23.95 (H) 09/29/2019   For f/u with Dr Cruzita Lederer -will need adj in tx

## 2019-10-02 NOTE — Assessment & Plan Note (Signed)
Was able to feel monofilament on foot exam today

## 2019-10-02 NOTE — Assessment & Plan Note (Signed)
Overdue to see endocrinology-she will schedule Suspect not well controlled  Nl eye exam in sept  Nl foot exam

## 2019-10-06 ENCOUNTER — Other Ambulatory Visit (HOSPITAL_COMMUNITY): Payer: Self-pay | Admitting: Cardiology

## 2019-10-08 ENCOUNTER — Other Ambulatory Visit: Payer: Self-pay | Admitting: Internal Medicine

## 2019-10-08 NOTE — Telephone Encounter (Signed)
Scheduled for next available - 12/09/2019

## 2019-10-26 ENCOUNTER — Other Ambulatory Visit: Payer: Self-pay | Admitting: Neurology

## 2019-10-26 ENCOUNTER — Other Ambulatory Visit: Payer: Self-pay | Admitting: Internal Medicine

## 2019-10-31 ENCOUNTER — Other Ambulatory Visit (HOSPITAL_COMMUNITY): Payer: Self-pay | Admitting: Cardiology

## 2019-11-02 ENCOUNTER — Other Ambulatory Visit: Payer: Self-pay | Admitting: Internal Medicine

## 2019-11-02 ENCOUNTER — Other Ambulatory Visit: Payer: Self-pay | Admitting: Neurology

## 2019-11-25 ENCOUNTER — Other Ambulatory Visit (HOSPITAL_COMMUNITY): Payer: Self-pay | Admitting: Cardiology

## 2019-12-02 ENCOUNTER — Other Ambulatory Visit (HOSPITAL_COMMUNITY): Payer: Self-pay | Admitting: Cardiology

## 2019-12-09 ENCOUNTER — Other Ambulatory Visit: Payer: Self-pay

## 2019-12-09 ENCOUNTER — Ambulatory Visit: Payer: PRIVATE HEALTH INSURANCE | Admitting: Internal Medicine

## 2019-12-09 ENCOUNTER — Encounter: Payer: Self-pay | Admitting: Internal Medicine

## 2019-12-09 VITALS — BP 142/68 | HR 72 | Ht 65.25 in | Wt 260.0 lb

## 2019-12-09 DIAGNOSIS — IMO0002 Reserved for concepts with insufficient information to code with codable children: Secondary | ICD-10-CM

## 2019-12-09 DIAGNOSIS — E1142 Type 2 diabetes mellitus with diabetic polyneuropathy: Secondary | ICD-10-CM | POA: Diagnosis not present

## 2019-12-09 DIAGNOSIS — C73 Malignant neoplasm of thyroid gland: Secondary | ICD-10-CM | POA: Diagnosis not present

## 2019-12-09 DIAGNOSIS — E1165 Type 2 diabetes mellitus with hyperglycemia: Secondary | ICD-10-CM

## 2019-12-09 DIAGNOSIS — E890 Postprocedural hypothyroidism: Secondary | ICD-10-CM | POA: Diagnosis not present

## 2019-12-09 LAB — POCT GLYCOSYLATED HEMOGLOBIN (HGB A1C): Hemoglobin A1C: 10.8 % — AB (ref 4.0–5.6)

## 2019-12-09 MED ORDER — TOUJEO SOLOSTAR 300 UNIT/ML ~~LOC~~ SOPN: 90.0000 [IU] | PEN_INJECTOR | Freq: Every day | SUBCUTANEOUS | 3 refills | Status: DC

## 2019-12-09 MED ORDER — HUMALOG KWIKPEN 200 UNIT/ML ~~LOC~~ SOPN: PEN_INJECTOR | SUBCUTANEOUS | 3 refills | Status: DC

## 2019-12-09 MED ORDER — INSULIN PEN NEEDLE 32G X 4 MM MISC: 3 refills | Status: DC

## 2019-12-09 NOTE — Progress Notes (Signed)
Patient ID: CORTNY BAMBACH, female   DOB: [birth year], 54 y.o.   MRN: [id number]  This visit occurred during the SARS-CoV-2 public health emergency.  Safety protocols were in place, including screening questions prior to the visit, additional usage of staff PPE, and extensive cleaning of exam room while observing appropriate contact time as indicated for disinfecting solutions.   HPI:At this visit, she tells me she  JACKALYNN ART is a 54 y.o.-year-old female, returning for f/u for DM2, dx 2008, insulin-dependent since ~2011, uncontrolled, with complications (peripheral neuropathy, dCHF), h/o papillary thyroid cancer and iatrogenic hypothyroidism. Last visit was a year ago, reveals visit was 1 year and 2 months prior.  At last visit, she was out of her medications for a month and sugars were quite high. At this visit, she tells me she stopped Ozempic 2/2 GI sxs since last OV. Sugars were very high when she was checking  - not in last 2 mo...  DM2: Reviewed HbA1c levels: Lab Results  Component Value Date   HGBA1C 10.2 (A) 11/27/2018   HGBA1C 9.6 (H) 08/14/2018   HGBA1C 7.5 (A) 09/12/2017    She is on: - Toujeo 45 x 2 injections at night - Metformin 1000 mg 2x a day - Invokana 300 mg in a.m. >> restarted at last visit - - started 08/2017 >> restarted at last visit >> stopped 2/2 nausea in 2020 She was also on Glipizide but developed hypoglycemia >> loss of consciousness. She was also on Amaryl >> taken off.   She checks sugars once a day - am: 123-160 >> 170-220 >> 140-160 >> 132-181 >> 175-269 - before lunch: 116-163 >> n/c >> 115 >> 310-380 - before dinner: 113 >> n/c >> 106, 156  >> 214-349 - bedtime: 155-178, 211 >> n/c >> 160s >> 153, 159 >> n/c Lowest sugar was 170 >> 120s >> 140 Highest sugar was 275 >> 190 >> 380  Freestyle libre CGM  - could not download, since she did not scan in the last 2 months... per review of the traces on her phone:  Sugars: - am: 220-270 - 2h  after b'fast: 280-420 - lunch: 310-350 - 2h after lunch: 310-410 - dinner: 300-470 - 2h after dinner: ? - bedtime:300-350  Pt's meals are: - Breakfast: skips - 10 o'clock: banana, apple, nectarine - Lunch: fast food - Dinner: goes out to eat around 8 o'clock: chicken sandwich or a wrap - Snacks: 0, no concentrated sweets  -No CKD, last BUN/creatinine:  Lab Results  Component Value Date   BUN 13 09/29/2019   CREATININE 0.75 09/29/2019  Not on ACE inhibitor/ARB  -+ HL; last set of lipids: Lab Results  Component Value Date   CHOL 241 (H) 09/29/2019   HDL 40.50 09/29/2019   LDLCALC 71 06/16/2015   LDLDIRECT 140.0 09/29/2019   TRIG (H) 09/29/2019    557.0 Triglyceride is over 400; calculations on Lipids are invalid.   CHOLHDL 6 09/29/2019  On simvastatin.  - last eye exam was in 07/2019: No DR reportedly, +glaucoma-on eyedrops. [facility]. - + Numbness and tingling in her feet.  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  Not have to have RAI treatment  Neck U/S (11/24/2014): Post thyroidectomy.  No suspicious lymphadenopathy or soft tissue.  Neck U/S (10/07/2017): Post total thyroidectomy without evidence residual, locally  recurrent or metastatic disease.  Pt is on levothyroxine 175 mcg daily, taken: - in am - fasting - at least 30 min from b'fast - no Ca or multivitamins, + iron at lunchtime and at night and PPIs at lunchtime - off Biotin She continues to miss doses during the weekend!!!  She is not compliant with her levothyroxine doses.  TFTs are fluctuating: Lab Results  Component Value Date   TSH 23.95 (H) 09/29/2019   TSH 20.95 (H) 08/14/2018   TSH 2.011 12/15/2017   TSH 6.32 (H) 08/01/2017   TSH 2.83 05/23/2017   TSH 14.73 (H) 07/26/2016   TSH 9.17 (A)  04/02/2016   TSH 11.83 (H) 06/16/2015   TSH 1.82 11/03/2014   TSH 8.756 (H) 03/24/2014   Pt denies: - feeling nodules in neck - hoarseness - choking - SOB with lying down But has chronic dysphagia  She is on vitamin D supplements.  ROS: Constitutional: no weight gain/no weight loss, no fatigue, no subjective hyperthermia, no subjective hypothermia Eyes: no blurry vision, no xerophthalmia ENT: no sore throat, + see HPI Cardiovascular: no CP/no SOB/no palpitations/no leg swelling Respiratory: no cough/no SOB/no wheezing Gastrointestinal: no N/no V/no D/no C/no acid reflux Musculoskeletal: + muscle aches/+ joint aches Skin: no rashes, no hair loss Neurological: no tremors/+ numbness/+ tingling/no dizziness  I reviewed pt's medications, allergies, PMH, social hx, family hx, and changes were documented in the history of present illness. Otherwise, unchanged from my initial visit note.  Past Medical History:  Diagnosis Date  . Allergy    SEASONAL  . Anemia    iron deficiency  . Anxiety   . Arthritis   . Asthma    a. 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  . Cellulitis and abscess of leg 02/2014  . CHF (congestive heart failure) ([place])   . Chronic kidney disease    /13  . Complication of anesthesia    DIFFICULT WAKING- 2011  . Depression    a. Stress reaction 08/2011 in multiple social stressors.  . Diabetes mellitus   . Dysrhythmia   . Gastritis   . GERD (gastroesophageal reflux disease)   . Heart murmur   . Hemorrhoid   . Hyperlipidemia   . Hypertension   . Hyperthyroidism   . Insomnia   . Morbid obesity ([place])   . OSA (obstructive sleep apnea) 01/17/2012  . Papillary thyroid carcinoma ([place])   . PCOS (polycystic ovarian syndrome)   . Sleep apnea    C PAP  . Thyroid cancer ([place]) 2014  . Vertigo    Past Surgical History:  Procedure Laterality Date  . CHOLECYSTECTOMY    . HEMORRHOID SURGERY    . INTRAUTERINE DEVICE  INSERTION  2011  . SHOULDER SURGERY  06/2009  . THYROIDECTOMY  04/10/2012   Procedure: THYROIDECTOMY;  Surgeon: Jerrell Belfast, MD;  Location: [facility];  Service: ENT;  Laterality: N/A;  Total Thyroidectomy  . TONSILLECTOMY AND ADENOIDECTOMY  CHILD  . TUBAL LIGATION     Social History   Socioeconomic History  . Marital status: Married    Spouse name: Kylan Veach  . Number of children: 2  . Years of education: Some college  . Highest education level: Not on file  Occupational History  . Occupation: [employer]  Tobacco Use  . Smoking status: Former Smoker    Packs/day: 0.20    Years: 5.00    Pack years: 1.00    Quit date: 03/25/2004  Years since quitting: 15.7  . Smokeless tobacco: Never Used  Substance and Sexual Activity  . Alcohol use: No    Alcohol/week: 0.0 standard drinks  . Drug use: No  . Sexual activity: Yes  Other Topics Concern  . Not on file  Social History Narrative   Lives with son/husband   Caffeine use: daily   Right handed    Social Determinants of Health   Financial Resource Strain:   . Difficulty of Paying Living Expenses: Not on file  Food Insecurity:   . Worried About Charity fundraiser in the Last Year: Not on file  . Ran Out of Food in the Last Year: Not on file  Transportation Needs:   . Lack of Transportation (Medical): Not on file  . Lack of Transportation (Non-Medical): Not on file  Physical Activity:   . Days of Exercise per Week: Not on file  . Minutes of Exercise per Session: Not on file  Stress:   . Feeling of Stress : Not on file  Social Connections:   . Frequency of Communication with Friends and Family: Not on file  . Frequency of Social Gatherings with Friends and Family: Not on file  . Attends Religious Services: Not on file  . Active Member of Clubs or Organizations: Not on file  . Attends Archivist Meetings: Not on file  . Marital Status: Not on file  Intimate Partner Violence:   . Fear of  Current or Ex-Partner: Not on file  . Emotionally Abused: Not on file  . Physically Abused: Not on file  . Sexually Abused: Not on file   Current Outpatient Medications on File Prior to Visit  Medication Sig Dispense Refill  . Armodafinil 200 MG TABS TAKE 1 TABLET BY MOUTH EVERY DAY IN THE MORNING. Please call [phone] to schedule f/u for ongoing refills 30 tablet 0  . Albuterol Sulfate (PROAIR RESPICLICK) 440 (90 Base) MCG/ACT AEPB Inhale 2 puffs into the lungs every 6 (six) hours as needed for up to 6 doses. 1 each 1  . buPROPion (WELLBUTRIN XL) 150 MG 24 hr tablet Take 1 tablet (150 mg total) by mouth daily. 90 tablet 3  . carvedilol (COREG) 3.125 MG tablet Take 1 tablet (3.125 mg total) by mouth 2 (two) times daily with a meal. 60 tablet 6  . cetirizine (ZYRTEC) 10 MG tablet Take 1 tablet (10 mg total) by mouth daily. 90 tablet 3  . Continuous Blood Gluc Sensor (FREESTYLE LIBRE 14 DAY SENSOR) MISC 1 each by Subdermal route every 14 (fourteen) days. 2 each 11  . cyclobenzaprine (FLEXERIL) 5 MG tablet TAKE 1 TABLET BY MOUTH TWICE A DAY 60 tablet 0  . ferrous sulfate 325 (65 FE) MG tablet Take 325 mg by mouth 2 (two) times daily.      . fluticasone (FLONASE) 50 MCG/ACT nasal spray Place 2 sprays into both nostrils daily as needed for allergies. 16 g 11  . glucose blood (ONE TOUCH ULTRA TEST) test strip Use 2x a day 100 each 2  . Insulin Pen Needle (B-D ULTRAFINE III SHORT PEN) 31G X 8 MM MISC USE ONCE A DAY 100 each 3  . INVOKANA 300 MG TABS tablet TAKE 1 TABLET (300 MG TOTAL) BY MOUTH DAILY BEFORE BREAKFAST. 30 tablet 11  . levothyroxine (SYNTHROID) 175 MCG tablet TAKE 1 TABLET BY MOUTH EVERY DAY BEFORE BREAKFAST 90 tablet 3  . metFORMIN (GLUCOPHAGE) 1000 MG tablet TAKE 1 TABLET BY MOUTH TWICE A DAY WITH  MEALS 180 tablet 3  . pantoprazole (PROTONIX) 40 MG tablet Take 1 tablet (40 mg total) by mouth daily. 90 tablet 3  . potassium chloride (KLOR-CON) 20 MEQ packet Take 20 mEq by mouth as  needed.    . sertraline (ZOLOFT) 100 MG tablet TAKE 1.5 TABLETS BY MOUTH DAILY 135 tablet 3  . simvastatin (ZOCOR) 20 MG tablet TAKE 1 TABLET BY MOUTH EVERY DAY IN THE EVENING 90 tablet 3  . spironolactone (ALDACTONE) 25 MG tablet TAKE 1 TABLET (25 MG TOTAL) BY MOUTH DAILY. MUST BE SEEN IN OFFICE FOR FURTHER REFILLS 15 tablet 0  . torsemide (DEMADEX) 20 MG tablet Take 1 tablet (20 mg total) by mouth daily as needed. 30 tablet 6  . TOUJEO SOLOSTAR 300 UNIT/ML Solostar Pen INJECT 90 UNITS INTO THE SKIN AT BEDTIME 9 mL 0  . TRAVATAN Z 0.004 % SOLN ophthalmic solution Place 1 drop into both eyes at bedtime.     . Vitamin D, Ergocalciferol, (DRISDOL) 1.25 MG (50000 UT) CAPS capsule Take 1 capsule (50,000 Units total) by mouth every 7 (seven) days. 13 capsule 4  . zolpidem (AMBIEN) 10 MG tablet TAKE 1/2 TO 1 TABLET BY MOUTH AT BEDTIME AS NEEDED 30 tablet 3   No current facility-administered medications on file prior to visit.   Allergies  Allergen Reactions  . Tape Rash    Plastic tape  . Buspirone Hcl Nausea Only  . Glipizide     REACTION: sleepy, low sugar  . Ivp Dye [Iodinated Diagnostic Agents]     Shut kidneys down  . Oxycodone-Acetaminophen Itching   Family History  Problem Relation Age of Onset  . Cancer Father        colon cancer  . Arthritis Father        osteoarthritis  . Colon cancer Father   . Colon polyps Father   . Transient ischemic attack Father   . COPD Father   . Arthritis Mother        osteoarthritis  . Asthma Mother   . Hypertension Mother   . Multiple sclerosis Mother   . Ovarian cancer Maternal Aunt   . Diabetes Paternal Aunt   . Diabetes Paternal Uncle   . Heart disease Maternal Grandfather    PE: BP (!) 142/68   Pulse 72   Ht 5' 5.25" (1.657 m)   Wt 260 lb (117.9 kg)   BMI 42.94 kg/m  Wt Readings from Last 3 Encounters:  12/09/19 260 lb (117.9 kg)  10/01/19 264 lb (119.7 kg)  12/16/18 268 lb 8 oz (121.8 kg)   Constitutional: overweight, in  NAD Eyes: PERRLA, EOMI, no exophthalmos ENT: moist mucous membranes, no thyromegaly, no cervical lymphadenopathy Cardiovascular: RRR, No RG, +2/6 SEM Respiratory: CTA B Gastrointestinal: abdomen soft, NT, ND, BS+ Musculoskeletal: no deformities, strength intact in all 4 Skin: moist, warm, no rashes Neurological: no tremor with outstretched hands, DTR normal in all 4  ASSESSMENT: 1. DM2, insulin-dependent, uncontrolled, without long-term complications but with hyperglycemia  2. Iatrogenic Hypothyroidism - h/o med noncompliance  3. [place] -No RAI treatment was necessary  PLAN:  1. Patient with history of uncontrolled diabetes, on basal insulin, weekly GLP-1 receptor agonist and oral diabetic medications with Metformin and Trulicity returning after long absence at last visit she was off her Invokana and Ozempic and her HbA1c was higher, at 10.2% and sugars were in the 200s and 300s >> we restarted these.  We also discussed at that time about stopping sodas and  starting to work on her diet and activity.  I also advised her to try to scan her freestyle libre sensor at least once every 8 hours to prevent loss of data.  I advised her to let me know if sugars do not improve after restarting the SGLT2 inhibitor and GLP-1 recent minutes, in which case, we would need mealtime insulin.  However, she did not contact me since last visit. -Since last visit, she stopped Ozempic 2/2 GI sxs >> sugars increased to 300-400s >> she did not contact me to start mealtime insulin - at today's visit, she is not checking sugars >>  Strongly advised her to use her CGM - will start Humalog at this visit >> advised her to increase the dose if still having postprandial hyperglycemia - will continue the rest of her regimen - we discussed that if she is not following instructions and not coming to appts as advised, I cannot help her.  - will have her come back in 1.5 mo. If not coming for this appt, I will  need to d/c her from the practice - I suggested to:  Patient Instructions  Please continue: - Toujeo 45 x 2 injections at night - Metformin 1000 mg 2x a day - Invokana 300 mg in a.m. - Ozempic 0.5 mg weekly  Please continue Levothyroxine 175 mcg daily.  Take the thyroid hormone every day, with water, at least 30 minutes before breakfast, separated by at least 4 hours from: - acid reflux medications - calcium - iron - multivitamins  Please return in 3 months with your sugar log.   - we checked her HbA1c: 10.8% (higher) - advised to check sugars at different times of the day - 3x a day, rotating check times - advised for yearly eye exams >> she is reportedly UTD - return to clinic in 3 months  2. Iatrogenic Hypothyroidism -This is uncontrolled, related to incomplete compliance with her levothyroxine doses - latest thyroid labs reviewed with pt >> elevated: Lab Results  Component Value Date   TSH 23.95 (H) 09/29/2019   - she continues on LT4 175 mcg daily - we discussed about taking the thyroid hormone every day, with water, >30 minutes before breakfast, separated by >4 hours from acid reflux medications, calcium, iron, multivitamins.  Upon questioning, she is missing approximately 2 doses of levothyroxine a week since she is waking up late during the weekend.  I strongly advised her not to miss any doses and to put levothyroxine on her nightstand. - will check thyroid tests at next visit  3. Follicular PTC -Reviewed her neck ultrasound reports from 11/2014 and 09/2017: No recurrence. -She denies neck compression symptoms other than chronic dysphagia  Philemon Kingdom, MD PhD [facility]

## 2019-12-09 NOTE — Addendum Note (Signed)
Addended by: Elta Guadeloupe on: 12/09/2019 02:02 PM   Modules accepted: Orders

## 2019-12-09 NOTE — Patient Instructions (Addendum)
Please continue: - Toujeo 45 x 2 injections at night - Metformin 1000 mg 2x a day - Invokana 300 mg in a.m.  Please start: - Humalog 12-15 units 3x a day before meals.  Please continue Levothyroxine 175 mcg daily.  Take the thyroid hormone every day, with water, at least 30 minutes before breakfast, separated by at least 4 hours from: - acid reflux medications - calcium - iron - multivitamins  Please return in 1.5 months.

## 2019-12-15 ENCOUNTER — Other Ambulatory Visit: Payer: Self-pay | Admitting: Neurology

## 2019-12-17 ENCOUNTER — Other Ambulatory Visit: Payer: Self-pay

## 2019-12-20 MED ORDER — CYCLOBENZAPRINE HCL 5 MG PO TABS
5.0000 mg | ORAL_TABLET | Freq: Two times a day (BID) | ORAL | 0 refills | Status: DC
Start: 2019-12-20 — End: 2020-02-08

## 2019-12-27 ENCOUNTER — Other Ambulatory Visit: Payer: Self-pay | Admitting: Internal Medicine

## 2019-12-27 ENCOUNTER — Other Ambulatory Visit: Payer: Self-pay | Admitting: Family Medicine

## 2019-12-28 NOTE — Telephone Encounter (Signed)
Name of Medication: Ambien Name of Pharmacy: [facility]/ [address] or Written Date and Quantity: 08/20/19 #30 tabs with 3 refills Last Office Visit and Type: CPE on 10/01/19 Next Office Visit and Type: none scheduled

## 2020-01-10 ENCOUNTER — Other Ambulatory Visit (HOSPITAL_COMMUNITY): Payer: Self-pay | Admitting: Cardiology

## 2020-01-10 ENCOUNTER — Other Ambulatory Visit: Payer: Self-pay | Admitting: Neurology

## 2020-01-18 ENCOUNTER — Other Ambulatory Visit (HOSPITAL_COMMUNITY): Payer: Self-pay | Admitting: Cardiology

## 2020-01-19 ENCOUNTER — Ambulatory Visit: Payer: PRIVATE HEALTH INSURANCE | Admitting: Internal Medicine

## 2020-01-19 ENCOUNTER — Encounter: Payer: Self-pay | Admitting: Internal Medicine

## 2020-01-19 ENCOUNTER — Other Ambulatory Visit: Payer: Self-pay

## 2020-01-19 VITALS — BP 140/80 | HR 72 | Ht 65.0 in | Wt 258.8 lb

## 2020-01-19 DIAGNOSIS — Z23 Encounter for immunization: Secondary | ICD-10-CM

## 2020-01-19 DIAGNOSIS — E1142 Type 2 diabetes mellitus with diabetic polyneuropathy: Secondary | ICD-10-CM | POA: Diagnosis not present

## 2020-01-19 DIAGNOSIS — E1165 Type 2 diabetes mellitus with hyperglycemia: Secondary | ICD-10-CM | POA: Diagnosis not present

## 2020-01-19 DIAGNOSIS — C73 Malignant neoplasm of thyroid gland: Secondary | ICD-10-CM

## 2020-01-19 DIAGNOSIS — E890 Postprocedural hypothyroidism: Secondary | ICD-10-CM

## 2020-01-19 DIAGNOSIS — IMO0002 Reserved for concepts with insufficient information to code with codable children: Secondary | ICD-10-CM

## 2020-01-19 MED ORDER — HUMALOG KWIKPEN 200 UNIT/ML ~~LOC~~ SOPN
PEN_INJECTOR | SUBCUTANEOUS | 3 refills | Status: DC
Start: 2020-01-19 — End: 2020-09-13

## 2020-01-19 NOTE — Progress Notes (Signed)
Patient ID: [name], female   DOB: 08-25-1965, 54 y.o.   MRN: [id number]  This visit occurred during the SARS-CoV-2 public health emergency.  Safety protocols were in place, including screening questions prior to the visit, additional usage of staff PPE, and extensive cleaning of exam room while observing appropriate contact time as indicated for disinfecting solutions.   HPI: [name] is a 54 y.o.-year-old female, returning for f/u for DM2, dx 2008, insulin-dependent since ~2011, uncontrolled, with complications (peripheral neuropathy, dCHF), h/o papillary thyroid cancer and iatrogenic hypothyroidism.  Last visit 1 month ago.  At last visit, she was out of her Invokana and also stopped the Ozempic due to nausea.  She was also not checking sugars consistently.  We restarted Invokana.  DM2: Reviewed HbA1c levels: Lab Results  Component Value Date   HGBA1C 10.8 (A) 12/09/2019   HGBA1C 10.2 (A) 11/27/2018   HGBA1C 9.6 (H) 08/14/2018    She is on: - Toujeo 45 x 2 injections at night - Metformin 1000 mg 2x a day - Invokana 300 mg in a.m.  - Humalog 12 to 15 units before meals-started 12/09/2019 - - started 08/2017 >> restarted at last visit >> stopped 2/2 nausea in 2020 She was also on Glipizide but developed hypoglycemia >> loss of consciousness. She was also on Amaryl >> taken off.   She checks sugars once a day - am: 123-160 >> 170-220 >> 140-160 >> 132-181 >> 175-269 - before lunch: 116-163 >> n/c >> 115 >> 310-380 - before dinner: 113 >> n/c >> 106, 156  >> 214-349 - bedtime: 155-178, 211 >> n/c >> 160s >> 153, 159 >> n/c Lowest sugar was 170 >> 120s >> 140 Highest sugar was 275 >> 190 >> 380  Pt.checks her sugars more than 4 times a day with his CGM.  Freestyle libre CGM parameters: - Average: 208 - % active CGM time: 45% of the time - Glucose variability 22.1% (target < or = to 36%) - time in range:  - very low (<54): 0% - low (54-69): 0% - normal range  (70-180): 32% - high sugars (181-250): 47% - very high sugars (>250): 21%    Previously:  - am: 220-270 - 2h after b'fast: 280-420 - lunch: 310-350 - 2h after lunch: 310-410 - dinner: 300-470 - 2h after dinner: ? - bedtime:300-350  Pt's meals are: - Breakfast: skips - 10 o'clock: banana, apple, nectarine - Lunch: fast food - Dinner: goes out to eat around 8 o'clock: chicken sandwich or a wrap - Snacks: 0, no concentrated sweets  -No CKD, last BUN/creatinine:  Lab Results  Component Value Date   BUN 13 09/29/2019   CREATININE 0.75 09/29/2019  Not on ACE inhibitor/ARB  -+ HL; last set of lipids: Lab Results  Component Value Date   CHOL 241 (H) 09/29/2019   HDL 40.50 09/29/2019   LDLCALC 71 06/16/2015   LDLDIRECT 140.0 09/29/2019   TRIG (H) 09/29/2019    557.0 Triglyceride is over 400; calculations on Lipids are invalid.   CHOLHDL 6 09/29/2019  On simvastatin.  - last eye exam was in 07/2019: No DR reportedly, + glaucoma. [facility]. - she has Numbness and tingling in her feet.  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  Not have to have RAI treatment  Neck U/S (11/24/2014): Post thyroidectomy.  No suspicious lymphadenopathy or soft tissue.  Neck U/S (10/07/2017): Post total thyroidectomy without evidence residual, locally recurrent or metastatic disease.  Pt is on levothyroxine 175 mcg daily, taken: - after getting to work, still missing doses during the weekend - in am - fasting - at least 30 min from b'fast - no calcium - + iron at lunchtime and at night - no multivitamins - + PPIs at lunchtime - off Biotin  She is not compliant with doses.  Her TFTs are fluctuating. Lab Results  Component Value Date   TSH 23.95 (H) 09/29/2019   TSH 20.95 (H) 08/14/2018     TSH 2.011 12/15/2017   TSH 6.32 (H) 08/01/2017   TSH 2.83 05/23/2017   TSH 14.73 (H) 07/26/2016   TSH 9.17 (A) 04/02/2016   TSH 11.83 (H) 06/16/2015   TSH 1.82 11/03/2014   TSH 8.756 (H) 03/24/2014   Pt denies: - feeling nodules in neck - hoarseness - choking - SOB with lying down But has chronic dysphagia  She is on vitamin D supplements.  ROS: Constitutional: no weight gain/no weight loss, no fatigue, no subjective hyperthermia, no subjective hypothermia Eyes: no blurry vision, no xerophthalmia ENT: no sore throat, + see HPI Cardiovascular: no CP/no SOB/no palpitations/no leg swelling Respiratory: no cough/no SOB/no wheezing Gastrointestinal: no N/no V/no D/no C/no acid reflux Musculoskeletal: + muscle aches/+ joint aches Skin: no rashes, no hair loss Neurological: no tremors/+ numbness/+ tingling/no dizziness  I reviewed pt's medications, allergies, PMH, social hx, family hx, and changes were documented in the history of present illness. Otherwise, unchanged from my initial visit note.  Past Medical History:  Diagnosis Date  . Allergy    SEASONAL  . Anemia    iron deficiency  . Anxiety   . Arthritis   . Asthma    a. 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  . Cellulitis and abscess of leg 02/2014  . CHF (congestive heart failure) ([place])   . Chronic kidney disease    /13  . Complication of anesthesia    DIFFICULT WAKING- 2011  . Depression    a. Stress reaction 08/2011 in multiple social stressors.  . Diabetes mellitus   . Dysrhythmia   . Gastritis   . GERD (gastroesophageal reflux disease)   . Heart murmur   . Hemorrhoid   . Hyperlipidemia   . Hypertension   . Hyperthyroidism   . Insomnia   . Morbid obesity ([place])   . OSA (obstructive sleep apnea) 01/17/2012  . Papillary thyroid carcinoma ([place])   . PCOS (polycystic ovarian syndrome)   . Sleep apnea    C PAP  . Thyroid cancer ([place]) 2014  . Vertigo    Past  Surgical History:  Procedure Laterality Date  . CHOLECYSTECTOMY    . HEMORRHOID SURGERY    . INTRAUTERINE DEVICE INSERTION  2011  . SHOULDER SURGERY  06/2009  . THYROIDECTOMY  04/10/2012   Procedure: THYROIDECTOMY;  Surgeon: Jerrell Belfast, MD;  Location: [facility];  Service: ENT;  Laterality: N/A;  Total Thyroidectomy  . TONSILLECTOMY AND ADENOIDECTOMY  CHILD  . TUBAL LIGATION     Social History   Socioeconomic History  . Marital status: Married    Spouse name: Signora Zucco  . Number of children: 2  . Years of education: Some college  . Highest education level: Not on file  Occupational History  . Occupation: [employer]  Tobacco Use  . Smoking status: Former Smoker  Packs/day: 0.20    Years: 5.00    Pack years: 1.00    Quit date: 03/25/2004    Years since quitting: 15.8  . Smokeless tobacco: Never Used  Substance and Sexual Activity  . Alcohol use: No    Alcohol/week: 0.0 standard drinks  . Drug use: No  . Sexual activity: Yes  Other Topics Concern  . Not on file  Social History Narrative   Lives with son/husband   Caffeine use: daily   Right handed    Social Determinants of Health   Financial Resource Strain:   . Difficulty of Paying Living Expenses: Not on file  Food Insecurity:   . Worried About Charity fundraiser in the Last Year: Not on file  . Ran Out of Food in the Last Year: Not on file  Transportation Needs:   . Lack of Transportation (Medical): Not on file  . Lack of Transportation (Non-Medical): Not on file  Physical Activity:   . Days of Exercise per Week: Not on file  . Minutes of Exercise per Session: Not on file  Stress:   . Feeling of Stress : Not on file  Social Connections:   . Frequency of Communication with Friends and Family: Not on file  . Frequency of Social Gatherings with Friends and Family: Not on file  . Attends Religious Services: Not on file  . Active Member of Clubs or Organizations: Not on file  . Attends English as a second language teacher Meetings: Not on file  . Marital Status: Not on file  Intimate Partner Violence:   . Fear of Current or Ex-Partner: Not on file  . Emotionally Abused: Not on file  . Physically Abused: Not on file  . Sexually Abused: Not on file   Current Outpatient Medications on File Prior to Visit  Medication Sig Dispense Refill  . Albuterol Sulfate (PROAIR RESPICLICK) 466 (90 Base) MCG/ACT AEPB Inhale 2 puffs into the lungs every 6 (six) hours as needed for up to 6 doses. 1 each 1  . Armodafinil 200 MG TABS TAKE 1 TABLET BY MOUTH EVERY DAY IN THE MORNING. Please call [phone] to schedule f/u for ongoing refills 30 tablet 0  . buPROPion (WELLBUTRIN XL) 150 MG 24 hr tablet Take 1 tablet (150 mg total) by mouth daily. 90 tablet 3  . carvedilol (COREG) 3.125 MG tablet Take 1 tablet (3.125 mg total) by mouth 2 (two) times daily with a meal. 60 tablet 6  . cetirizine (ZYRTEC) 10 MG tablet Take 1 tablet (10 mg total) by mouth daily. 90 tablet 3  . Continuous Blood Gluc Sensor (FREESTYLE LIBRE 14 DAY SENSOR) MISC 1 each by Subdermal route every 14 (fourteen) days. 2 each 11  . cyclobenzaprine (FLEXERIL) 5 MG tablet Take 1 tablet (5 mg total) by mouth 2 (two) times daily. 60 tablet 0  . ferrous sulfate 325 (65 FE) MG tablet Take 325 mg by mouth 2 (two) times daily.      . fluticasone (FLONASE) 50 MCG/ACT nasal spray Place 2 sprays into both nostrils daily as needed for allergies. 16 g 11  . glucose blood (ONE TOUCH ULTRA TEST) test strip Use 2x a day 100 each 2  . insulin glargine, 1 Unit Dial, (TOUJEO SOLOSTAR) 300 UNIT/ML Solostar Pen Inject 90 Units into the skin at bedtime. 9 mL 3  . insulin lispro (HUMALOG KWIKPEN) 200 UNIT/ML KwikPen Inject 12-15 units before meals under skin 12 mL 3  . Insulin Pen Needle 32G X 4 MM  MISC Use 4x a day 300 each 3  . INVOKANA 300 MG TABS tablet TAKE 1 TABLET BY MOUTH DAILY BEFORE BREAKFAST. 30 tablet 11  . levothyroxine (SYNTHROID) 175 MCG tablet TAKE 1  TABLET BY MOUTH EVERY DAY BEFORE BREAKFAST 90 tablet 3  . metFORMIN (GLUCOPHAGE) 1000 MG tablet TAKE 1 TABLET BY MOUTH TWICE A DAY WITH MEALS 180 tablet 3  . pantoprazole (PROTONIX) 40 MG tablet Take 1 tablet (40 mg total) by mouth daily. 90 tablet 3  . potassium chloride (KLOR-CON) 20 MEQ packet Take 20 mEq by mouth as needed.    . sertraline (ZOLOFT) 100 MG tablet TAKE 1.5 TABLETS BY MOUTH DAILY 135 tablet 3  . simvastatin (ZOCOR) 20 MG tablet TAKE 1 TABLET BY MOUTH EVERY DAY IN THE EVENING 90 tablet 3  . spironolactone (ALDACTONE) 25 MG tablet TAKE 1 TABLET (25 MG TOTAL) BY MOUTH DAILY. MUST BE SEEN IN OFFICE FOR FURTHER REFILLS 15 tablet 0  . torsemide (DEMADEX) 20 MG tablet Take 1 tablet (20 mg total) by mouth daily as needed. 30 tablet 6  . TRAVATAN Z 0.004 % SOLN ophthalmic solution Place 1 drop into both eyes at bedtime.     . Vitamin D, Ergocalciferol, (DRISDOL) 1.25 MG (50000 UNIT) CAPS capsule TAKE 1 CAPSULE BY MOUTH EVERY 7 DAYS 13 capsule 4  . zolpidem (AMBIEN) 10 MG tablet TAKE 1/2 TO 1 TABLET BY MOUTH AT BEDTIME AS NEEDED 30 tablet 3   No current facility-administered medications on file prior to visit.   Allergies  Allergen Reactions  . Tape Rash    Plastic tape  . Buspirone Hcl Nausea Only  . Glipizide     REACTION: sleepy, low sugar  . Ivp Dye [Iodinated Diagnostic Agents]     Shut kidneys down  . Oxycodone-Acetaminophen Itching   Family History  Problem Relation Age of Onset  . Cancer Father        colon cancer  . Arthritis Father        osteoarthritis  . Colon cancer Father   . Colon polyps Father   . Transient ischemic attack Father   . COPD Father   . Arthritis Mother        osteoarthritis  . Asthma Mother   . Hypertension Mother   . Multiple sclerosis Mother   . Ovarian cancer Maternal Aunt   . Diabetes Paternal Aunt   . Diabetes Paternal Uncle   . Heart disease Maternal Grandfather    PE: BP 140/80   Pulse 72   Ht _0  (1.651 m)   Wt 258 lb  12.8 oz (117.4 kg)   SpO2 97%   BMI 43.07 kg/m  Wt Readings from Last 3 Encounters:  01/19/20 258 lb 12.8 oz (117.4 kg)  12/09/19 260 lb (117.9 kg)  10/01/19 264 lb (119.7 kg)   Constitutional: overweight, in NAD Eyes: PERRLA, EOMI, no exophthalmos ENT: moist mucous membranes, no neck masses, no cervical lymphadenopathy Cardiovascular: RRR, No MRG Respiratory: CTA B Gastrointestinal: abdomen soft, NT, ND, BS+ Musculoskeletal: no deformities, strength intact in all 4 Skin: moist, warm, no rashes Neurological: no tremor with outstretched hands, DTR normal in all 4  ASSESSMENT: 1. DM2, insulin-dependent, uncontrolled, without long-term complications but with hyperglycemia  2. Iatrogenic Hypothyroidism - h/o med noncompliance  3. [place] -No RAI treatment was necessary  PLAN:  1. Patient with history of uncontrolled type 2 diabetes, on basal insulin, and oral medications: Metformin and SGLT2 inhibitor.  She stopped Ozempic before  last visit due to GI symptoms.  After this, sugars increased to 300s and 400s.  At last visit, we started Humalog we discussed about improving compliance with medications, blood sugars, and visits.  I advised her that if she does not come for her appointment follow-up advised, I would not be able to help her. CGM interpretation: -At today's visit, we reviewed together her CGM downloads for the last 2 weeks.  It appears that 32% of her blood sugars are in target range, but 68% are above target.  Her average glucose is 208, corresponding to the lower HbA1c then at last visit, but still quite high.  She still has gaps in the data and we discussed about how to alleviate these, by scanning the sensor at least every 8 hours. -Reviewing the overlapped tracings, it appears that her sugars are fairly stable overnight, but still above target and they increase fairly abruptly after 8 AM.  She is not drinking coffee frequently (only approximately twice a week),  or eating breakfast consistently.  However, she may eat takeout brought by reps when she reaches her workplace in the morning.  We discussed that she will need to bolus for these, but she may need to use a lower dose of Humalog even if she is not eating or drinking anything in the morning.  I advised her to use 5 to 10 units of Humalog around 8 AM.  Also, sugars increased after lunch and particularly after dinner.  Upon questioning, she is not separating the Humalog bolus by 15 minutes from dinner and I strongly advised her to do so.  We will also increase the Humalog doses.  She is currently using 15 units and we will increase this to 15-20 units depending on the size and consistency of the meals.  She also describes having some gas in the middle of the day, when her sugars are actually between 220 and 270.  Diet strongly advised her to stop regular sodas completely. -For now, we will continue the same dose of Toujeo, Metformin, and Invokana. - I suggested to:  Patient Instructions  Please continue: - Toujeo 45 x 2 injections at night - Metformin 1000 mg 2x a day - Invokana 300 mg in a.m.  Please increase: - Humalog 15-20 units 3x a day 15 min before meals  You may need 5-10 units of Humalog around 8 am even if you do not drink coffee or eat b'fast.  STOP sodas.  Please continue Levothyroxine 175 mcg daily.  Take the thyroid hormone every day (MOVE IT TO NIGHTSTAND) with water, at least 30 minutes before breakfast, separated by at least 4 hours from: - acid reflux medications - calcium - iron - multivitamins  Please return in 2 months.  - advised to check sugars at different times of the day - 3x a day, rotating check times - advised for yearly eye exams >> she is UTD - return to clinic in 2 months  2. Iatrogenic Hypothyroidism -Uncontrolled, related to incomplete compliance with her levothyroxine dose - latest thyroid labs reviewed with pt >> TSH is very high: Lab Results    Component Value Date   TSH 23.95 (H) 09/29/2019  -At last visit we discussed again about the importance of taking levothyroxine correctly.  Upon questioning, she missed several doses of levothyroxine.  I strongly advised her not to miss doses anymore including on the nightstand.  We did not change the dose. - she continues on LT4 175 mcg daily - we discussed  about taking the thyroid hormone every day, with water, >30 minutes before breakfast, separated by >4 hours from acid reflux medications, calcium, iron, multivitamins. Pt. is taking it only when she gets to work.  At this visit, I advised her to move needs to her nightstand and put it on top of her phone so she remembers to take it every morning. - will check thyroid tests at next visit, after she starts taking it consistently  3. Follicular PTC -Reviewed her neck ultrasound report from 11/2014 and 09/2017: No recurrence -She does have chronic dysphagia but no new neck compression symptoms  Philemon Kingdom, MD PhD [facility] Endocrinology

## 2020-01-19 NOTE — Patient Instructions (Addendum)
Please continue: - Metformin 1000 mg 2x a day - Invokana 300 mg in a.m. - Toujeo 45 x 2 injections at night  Please increase: - Humalog 15-20 units 3x a day 15 min before meals  You may need 5-10 units of Humalog around 8 am even if you do not drink coffee or eat b'fast.  STOP sodas.  Please continue Levothyroxine 175 mcg daily.  Take the thyroid hormone every day (MOVE IT TO NIGHTSTAND) with water, at least 30 minutes before breakfast, separated by at least 4 hours from: - acid reflux medications - calcium - iron - multivitamins  Please return in 2 months.

## 2020-01-27 ENCOUNTER — Other Ambulatory Visit: Payer: Self-pay | Admitting: Neurology

## 2020-01-27 ENCOUNTER — Other Ambulatory Visit: Payer: Self-pay | Admitting: Internal Medicine

## 2020-01-27 ENCOUNTER — Other Ambulatory Visit (HOSPITAL_COMMUNITY): Payer: Self-pay | Admitting: Cardiology

## 2020-02-03 ENCOUNTER — Encounter: Payer: Self-pay | Admitting: Family Medicine

## 2020-02-03 ENCOUNTER — Other Ambulatory Visit: Payer: Self-pay | Admitting: Internal Medicine

## 2020-02-03 ENCOUNTER — Other Ambulatory Visit (HOSPITAL_COMMUNITY): Payer: Self-pay | Admitting: Cardiology

## 2020-02-03 NOTE — Telephone Encounter (Signed)
Left VM letting pt know she needs an appt to discuss her issues. Also sent pt a mychart message letting her know she needs an appt

## 2020-02-05 ENCOUNTER — Other Ambulatory Visit (HOSPITAL_COMMUNITY): Payer: Self-pay | Admitting: Cardiology

## 2020-02-08 ENCOUNTER — Other Ambulatory Visit: Payer: Self-pay | Admitting: Neurology

## 2020-02-08 MED ORDER — CYCLOBENZAPRINE HCL 5 MG PO TABS
5.0000 mg | ORAL_TABLET | Freq: Two times a day (BID) | ORAL | 0 refills | Status: DC
Start: 2020-02-08 — End: 2020-09-13

## 2020-02-10 ENCOUNTER — Telehealth (HOSPITAL_COMMUNITY): Payer: Self-pay | Admitting: Pharmacist

## 2020-02-10 MED ORDER — CARVEDILOL 3.125 MG PO TABS
3.1250 mg | ORAL_TABLET | Freq: Two times a day (BID) | ORAL | 2 refills | Status: DC
Start: 2020-02-10 — End: 2020-05-04

## 2020-02-10 NOTE — Telephone Encounter (Signed)
Carvedilol refill sent to [facility]. Needs appointment for further refills.

## 2020-02-15 ENCOUNTER — Other Ambulatory Visit: Payer: Self-pay

## 2020-02-15 DIAGNOSIS — IMO0002 Reserved for concepts with insufficient information to code with codable children: Secondary | ICD-10-CM

## 2020-02-15 DIAGNOSIS — E1142 Type 2 diabetes mellitus with diabetic polyneuropathy: Secondary | ICD-10-CM

## 2020-02-15 DIAGNOSIS — E1165 Type 2 diabetes mellitus with hyperglycemia: Secondary | ICD-10-CM

## 2020-02-15 MED ORDER — BD PEN NEEDLE NANO 2ND GEN 32G X 4 MM MISC: 3 refills | Status: DC

## 2020-02-16 ENCOUNTER — Other Ambulatory Visit (HOSPITAL_COMMUNITY): Payer: Self-pay | Admitting: Cardiology

## 2020-02-25 LAB — HM DIABETES EYE EXAM

## 2020-02-28 ENCOUNTER — Other Ambulatory Visit (HOSPITAL_COMMUNITY): Payer: Self-pay | Admitting: Cardiology

## 2020-02-29 ENCOUNTER — Encounter: Payer: Self-pay | Admitting: Family Medicine

## 2020-03-10 ENCOUNTER — Other Ambulatory Visit (HOSPITAL_COMMUNITY): Payer: Self-pay | Admitting: Cardiology

## 2020-03-14 ENCOUNTER — Encounter (HOSPITAL_COMMUNITY): Payer: Self-pay | Admitting: Cardiology

## 2020-03-28 ENCOUNTER — Other Ambulatory Visit (HOSPITAL_COMMUNITY): Payer: Self-pay | Admitting: Cardiology

## 2020-04-12 ENCOUNTER — Other Ambulatory Visit: Payer: Self-pay | Admitting: Internal Medicine

## 2020-04-12 ENCOUNTER — Other Ambulatory Visit: Payer: Self-pay | Admitting: Family Medicine

## 2020-04-12 ENCOUNTER — Other Ambulatory Visit: Payer: Self-pay | Admitting: Neurology

## 2020-04-14 ENCOUNTER — Ambulatory Visit: Payer: PRIVATE HEALTH INSURANCE | Admitting: Internal Medicine

## 2020-04-19 ENCOUNTER — Other Ambulatory Visit: Payer: Self-pay | Admitting: Family Medicine

## 2020-04-19 ENCOUNTER — Other Ambulatory Visit: Payer: Self-pay | Admitting: Neurology

## 2020-04-19 NOTE — Telephone Encounter (Signed)
Pharmacy requests refill on: Sertraline 100 mg   LAST REFILL: 10/01/2019 (Q-135, R-3) LAST OV: 10/01/2019 NEXT OV: Not Scheduled  PHARMACY: [id number] [place], [place]

## 2020-05-03 ENCOUNTER — Other Ambulatory Visit: Payer: Self-pay | Admitting: Family Medicine

## 2020-05-03 ENCOUNTER — Other Ambulatory Visit (HOSPITAL_COMMUNITY): Payer: Self-pay | Admitting: Internal Medicine

## 2020-05-03 NOTE — Telephone Encounter (Signed)
She is due for PE after July 9th I think  Refill done Please schedule PE

## 2020-05-03 NOTE — Telephone Encounter (Signed)
I left a detailed message on patient's voice mail to call back and schedule cpx after 09/30/20.

## 2020-05-03 NOTE — Telephone Encounter (Signed)
Pharmacy requests refill on: Zolpidem Tartrate 10 mg   LAST REFILL: 12/28/2019 (Q-30, R-3) LAST OV: 10/01/2019 NEXT OV: Not Scheduled  PHARMACY: [id number] [place], [place]

## 2020-05-06 ENCOUNTER — Other Ambulatory Visit: Payer: Self-pay | Admitting: Internal Medicine

## 2020-05-07 NOTE — Telephone Encounter (Signed)
Insurance alternatives covered Child psychotherapist or [place]

## 2020-05-17 ENCOUNTER — Other Ambulatory Visit: Payer: Self-pay | Admitting: Neurology

## 2020-05-19 ENCOUNTER — Other Ambulatory Visit: Payer: Self-pay | Admitting: Internal Medicine

## 2020-05-19 MED ORDER — EMPAGLIFLOZIN 25 MG PO TABS: 25.0000 mg | ORAL_TABLET | Freq: Every day | ORAL | 3 refills | Status: DC

## 2020-05-19 NOTE — Telephone Encounter (Signed)
T, I just saw this while looking at the refill requests.  I am not sure why your message did not come to me... I sent a prescription for Jardiance 25.

## 2020-05-27 ENCOUNTER — Other Ambulatory Visit (HOSPITAL_COMMUNITY): Payer: Self-pay | Admitting: Internal Medicine

## 2020-05-30 NOTE — Telephone Encounter (Signed)
Last office visit 06/2018 Reminder to schedule appt  Letter mailed 03/14/20 and 05/04/20 [facility] 05/30/20   Will forward to provider-ok to authorize refill?

## 2020-07-02 ENCOUNTER — Other Ambulatory Visit (HOSPITAL_COMMUNITY): Payer: Self-pay | Admitting: Internal Medicine

## 2020-07-27 ENCOUNTER — Ambulatory Visit (INDEPENDENT_AMBULATORY_CARE_PROVIDER_SITE_OTHER): Payer: PRIVATE HEALTH INSURANCE | Admitting: Orthopedic Surgery

## 2020-07-27 DIAGNOSIS — L97421 Non-pressure chronic ulcer of left heel and midfoot limited to breakdown of skin: Secondary | ICD-10-CM | POA: Diagnosis not present

## 2020-07-31 ENCOUNTER — Encounter: Payer: Self-pay | Admitting: Orthopedic Surgery

## 2020-07-31 ENCOUNTER — Ambulatory Visit (INDEPENDENT_AMBULATORY_CARE_PROVIDER_SITE_OTHER): Payer: PRIVATE HEALTH INSURANCE | Admitting: Orthopedic Surgery

## 2020-07-31 DIAGNOSIS — L03116 Cellulitis of left lower limb: Secondary | ICD-10-CM | POA: Diagnosis not present

## 2020-07-31 NOTE — Progress Notes (Signed)
Office Visit Note   Patient: [name]           Date of Birth: 01-12-1966           MRN: [id number] Visit Date: 07/31/2020              Requested by: Tower, Wynelle Fanny, MD [place],  [place] [address] PCP: [name], MD  No chief complaint on file.     HPI: Patient is a 55 year old woman who presents in follow-up for cellulitis lateral left heel.  Patient has been placed in a PRAFO she is on Augmentin.  Patient denies any worsening of the cellulitis.  Assessment & Plan: Visit Diagnoses:  1. Cellulitis of left ankle     Plan: Patient will continue with the compression sock continue with her antibiotics she states she wants to return to work she will continue with the [facility].  Follow-Up Instructions: No follow-ups on file.   Ortho Exam  Patient is alert, oriented, no adenopathy, well-dressed, normal affect, normal respiratory effort. Examination there is no proximal redness from the dermatitis.  There is no Achilles nodules no fluid around the Achilles.  No pain with passive range of motion of the ankle.  The superficial heel ulcer is stable and 5 mm in diameter 1 mm deep.  She also has a venous stasis ulcer over the lateral calf which is also 5 mm in diameter 0.1 mm deep there is no cellulitis around the calf ulcer there is no cellulitis around the heel ulcer.  She has a good palpable dorsalis pedis pulse.  Imaging: No results found. No images are attached to the encounter.  Labs: Lab Results  Component Value Date   HGBA1C 10.8 (A) 12/09/2019   HGBA1C 10.2 (A) 11/27/2018   HGBA1C 9.6 (H) 08/14/2018   REPTSTATUS 03/28/2014 FINAL 03/22/2014   CULT  03/22/2014    NO GROWTH 5 DAYS Performed at [facility] Multiple bacterial morphotypes present, none 02/04/2012   LABORGA predominant. Suggest appropriate recollection if  02/04/2012   LABORGA clinically indicated. 02/04/2012     Lab Results  Component Value Date    ALBUMIN 4.4 09/29/2019   ALBUMIN 4.2 08/14/2018   ALBUMIN 4.2 08/01/2017    Lab Results  Component Value Date   MG 2.0 12/25/2017   MG 1.7 12/15/2017   MG 1.9 03/24/2014   Lab Results  Component Value Date   VD25OH 26.43 (L) 09/29/2019   VD25OH 10.1 (L) 09/29/2018    No results found for: PREALBUMIN CBC EXTENDED Latest Ref Rng & Units 09/29/2019 08/14/2018 08/01/2017  WBC 4.0 - 10.5 K/uL 8.0 7.1 8.2  RBC 3.87 - 5.11 Mil/uL 4.63 4.31 5.09  HGB 12.0 - 15.0 g/dL 13.8 12.7 13.9  HCT 36.0 - 46.0 % 41.9 38.3 42.9  PLT 150.0 - 400.0 K/uL 229.0 251.0 240.0  NEUTROABS 1.4 - 7.7 K/uL 5.3 4.6 5.8  LYMPHSABS 0.7 - 4.0 K/uL 2.0 1.8 1.5     There is no height or weight on file to calculate BMI.  Orders:  No orders of the defined types were placed in this encounter.  No orders of the defined types were placed in this encounter.    Procedures: No procedures performed  Clinical Data: No additional findings.  ROS:  All other systems negative, except as noted in the HPI. Review of Systems  Objective: Vital Signs: There were no vitals taken for this visit.  Specialty Comments:  No specialty comments available.  PMFS History: Patient Active Problem List   Diagnosis Date Noted  . Vitamin D deficiency 09/27/2019  . Lumbar radiculopathy 12/16/2018  . Diabetes, polyneuropathy ([place]) 12/16/2018  . Bilateral carpal tunnel syndrome 11/24/2018  . Memory loss 09/29/2018  . Numbness 09/29/2018  . Family history of MS (multiple sclerosis) 09/29/2018  . Leg weakness, bilateral 08/10/2018  . Routine general medical examination at a health care facility 12/19/2013  . Palpitations 02/03/2013  . Nonspecific abnormal toxicology 10/29/2012  . 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 History of thyroid cancer 04/27/2012  . Cushing's syndrome ([place]) 04/27/2012  . Chronic diastolic heart failure ([place]) 02/10/2012  . Diastolic CHF ([place]) 05/39/7673  . OSA (obstructive sleep apnea) just got  cpap at home this past week 01/17/12 01/17/2012  . Chronic allergic rhinitis 10/26/2010  . Insomnia 10/26/2010  . OBESITY, MORBID 04/27/2010  . HEMORRHOIDS, INTERNAL, WITH BLEEDING 08/17/2009  . GERD 04/27/2008  . Essential hypertension 11/03/2007  . Hyperlipidemia associated with type 2 diabetes mellitus ([place]) 05/14/2007  . OTHER ORGANIC SLEEP DISORDERS 03/09/2007  . Uncontrolled type 2 diabetes mellitus with peripheral neuropathy ([place]) 09/11/2006  . POLYCYSTIC OVARIES 09/11/2006  . Iron deficiency anemia 09/11/2006  . Depression with anxiety 09/11/2006  . EXTERNAL HEMORRHOIDS 09/11/2006  . Asthma 09/11/2006   Past Medical History:  Diagnosis Date  . Allergy    SEASONAL  . Anemia    iron deficiency  . Anxiety   . Arthritis   . Asthma    a. 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  . Cellulitis and abscess of leg 02/2014  . CHF (congestive heart failure) ([place])   . Chronic kidney disease    /13  . Complication of anesthesia    DIFFICULT WAKING- 2011  . Depression    a. Stress reaction 08/2011 in multiple social stressors.  . Diabetes mellitus   . Dysrhythmia   . Gastritis   . GERD (gastroesophageal reflux disease)   . Heart murmur   . Hemorrhoid   . Hyperlipidemia   . Hypertension   . Hyperthyroidism   . Insomnia   . Morbid obesity ([place])   . OSA (obstructive sleep apnea) 01/17/2012  . Papillary thyroid carcinoma ([place])   . PCOS (polycystic ovarian syndrome)   . Sleep apnea    C PAP  . Thyroid cancer ([place]) 2014  . Vertigo     Family History  Problem Relation Age of Onset  . Cancer Father        colon cancer  . Arthritis Father        osteoarthritis  . Colon cancer Father   . Colon polyps Father   . Transient ischemic attack Father   . COPD Father   . Arthritis Mother        osteoarthritis  . Asthma Mother   . Hypertension Mother   . Multiple sclerosis Mother   . Ovarian cancer Maternal Aunt   . Diabetes Paternal Aunt    . Diabetes Paternal Uncle   . Heart disease Maternal Grandfather     Past Surgical History:  Procedure Laterality Date  . CHOLECYSTECTOMY    . HEMORRHOID SURGERY    . INTRAUTERINE DEVICE INSERTION  2011  . SHOULDER SURGERY  06/2009  . THYROIDECTOMY  04/10/2012   Procedure: THYROIDECTOMY;  Surgeon: Jerrell Belfast, MD;  Location: [facility];  Service: ENT;  Laterality: N/A;  Total Thyroidectomy  . TONSILLECTOMY AND ADENOIDECTOMY  CHILD  . TUBAL  LIGATION     Social History   Occupational History  . Occupation: [employer]  Tobacco Use  . Smoking status: Former Smoker    Packs/day: 0.20    Years: 5.00    Pack years: 1.00    Quit date: 03/25/2004    Years since quitting: 16.3  . Smokeless tobacco: Never Used  Substance and Sexual Activity  . Alcohol use: No    Alcohol/week: 0.0 standard drinks  . Drug use: No  . Sexual activity: Yes

## 2020-07-31 NOTE — Progress Notes (Signed)
Office Visit Note   Patient: [name]           Date of Birth: 1966/01/22           MRN: [id number] Visit Date: 07/27/2020              Requested by: Tower, Wynelle Fanny, MD [place],  [place] [address] PCP: [name], MD  No chief complaint on file.     HPI: Patient is a 55 year old woman who is seen for initial evaluation for cellulitis medial aspect of the left heel with a small black ulcer over the heel she states this started 3 weeks ago and split open.  She states [place] the [place] she has been on Augmentin that she started on Tuesday she had a Rocephin IM injection today.  She is currently in a fracture boot she complains of redness pain and drainage.  She states her most recent hemoglobin A1c is 10.8.  She states she has had a history of cellulitis of the right leg.  Assessment & Plan: Visit Diagnoses:  1. Non-pressure chronic ulcer of left heel and midfoot limited to breakdown of skin ([place])     Plan: Patient was given a PRAFO to unload the heel ulcer continue with antibiotics continue with elevation recommended compression.  Follow-up early next week to reevaluate.  Follow-Up Instructions: Return in about 1 week (around 08/03/2020).   Ortho Exam  Patient is alert, oriented, no adenopathy, well-dressed, normal affect, normal respiratory effort. Examination patient has a good dorsalis pedis and posterior tibial pulse there is an area outlining the cellulitis and the cellulitis is approximately half a centimeter inside the line.  The Was applied a 10 blade knife was used after informed consent to repair the callus skin around the ulcer medial aspect the left heel.  This area is 5 mm in diameter there is no deep abscess no purulent drainage the calcaneus is nontender to palpation.  The area of dermatitis over the medial heel is tender to palpation the Achilles tendon is nontender to palpation with no fluctuance no nodules.  The  cellulitis does not extend into the Achilles.  There is no palpable defect of the Achilles.  Imaging: No results found. No images are attached to the encounter.  Labs: Lab Results  Component Value Date   HGBA1C 10.8 (A) 12/09/2019   HGBA1C 10.2 (A) 11/27/2018   HGBA1C 9.6 (H) 08/14/2018   REPTSTATUS 03/28/2014 FINAL 03/22/2014   CULT  03/22/2014    NO GROWTH 5 DAYS Performed at [facility] Multiple bacterial morphotypes present, none 02/04/2012   LABORGA predominant. Suggest appropriate recollection if  02/04/2012   LABORGA clinically indicated. 02/04/2012     Lab Results  Component Value Date   ALBUMIN 4.4 09/29/2019   ALBUMIN 4.2 08/14/2018   ALBUMIN 4.2 08/01/2017    Lab Results  Component Value Date   MG 2.0 12/25/2017   MG 1.7 12/15/2017   MG 1.9 03/24/2014   Lab Results  Component Value Date   VD25OH 26.43 (L) 09/29/2019   VD25OH 10.1 (L) 09/29/2018    No results found for: PREALBUMIN CBC EXTENDED Latest Ref Rng & Units 09/29/2019 08/14/2018 08/01/2017  WBC 4.0 - 10.5 K/uL 8.0 7.1 8.2  RBC 3.87 - 5.11 Mil/uL 4.63 4.31 5.09  HGB 12.0 - 15.0 g/dL 13.8 12.7 13.9  HCT 36.0 - 46.0 % 41.9 38.3 42.9  PLT 150.0 - 400.0  K/uL 229.0 251.0 240.0  NEUTROABS 1.4 - 7.7 K/uL 5.3 4.6 5.8  LYMPHSABS 0.7 - 4.0 K/uL 2.0 1.8 1.5     There is no height or weight on file to calculate BMI.  Orders:  No orders of the defined types were placed in this encounter.  No orders of the defined types were placed in this encounter.    Procedures: No procedures performed  Clinical Data: No additional findings.  ROS:  All other systems negative, except as noted in the HPI. Review of Systems  Objective: Vital Signs: There were no vitals taken for this visit.  Specialty Comments:  No specialty comments available.  PMFS History: Patient Active Problem List   Diagnosis Date Noted  . Vitamin D deficiency 09/27/2019  . Lumbar radiculopathy 12/16/2018  .  Diabetes, polyneuropathy ([place]) 12/16/2018  . Bilateral carpal tunnel syndrome 11/24/2018  . Memory loss 09/29/2018  . Numbness 09/29/2018  . Family history of MS (multiple sclerosis) 09/29/2018  . Leg weakness, bilateral 08/10/2018  . Routine general medical examination at a health care facility 12/19/2013  . Palpitations 02/03/2013  . Nonspecific abnormal toxicology 10/29/2012  . 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 History of thyroid cancer 04/27/2012  . Cushing's syndrome ([place]) 04/27/2012  . Chronic diastolic heart failure ([place]) 02/10/2012  . Diastolic CHF ([place]) 85/46/2703  . OSA (obstructive sleep apnea) just got cpap at home this past week 01/17/12 01/17/2012  . Chronic allergic rhinitis 10/26/2010  . Insomnia 10/26/2010  . OBESITY, MORBID 04/27/2010  . HEMORRHOIDS, INTERNAL, WITH BLEEDING 08/17/2009  . GERD 04/27/2008  . Essential hypertension 11/03/2007  . Hyperlipidemia associated with type 2 diabetes mellitus ([place]) 05/14/2007  . OTHER ORGANIC SLEEP DISORDERS 03/09/2007  . Uncontrolled type 2 diabetes mellitus with peripheral neuropathy ([place]) 09/11/2006  . POLYCYSTIC OVARIES 09/11/2006  . Iron deficiency anemia 09/11/2006  . Depression with anxiety 09/11/2006  . EXTERNAL HEMORRHOIDS 09/11/2006  . Asthma 09/11/2006   Past Medical History:  Diagnosis Date  . Allergy    SEASONAL  . Anemia    iron deficiency  . Anxiety   . Arthritis   . Asthma    a. 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  . Cellulitis and abscess of leg 02/2014  . CHF (congestive heart failure) ([place])   . Chronic kidney disease    /13  . Complication of anesthesia    DIFFICULT WAKING- 2011  . Depression    a. Stress reaction 08/2011 in multiple social stressors.  . Diabetes mellitus   . Dysrhythmia   . Gastritis   . GERD (gastroesophageal reflux disease)   . Heart murmur   . Hemorrhoid   . Hyperlipidemia   . Hypertension   . Hyperthyroidism   .  Insomnia   . Morbid obesity ([place])   . OSA (obstructive sleep apnea) 01/17/2012  . Papillary thyroid carcinoma ([place])   . PCOS (polycystic ovarian syndrome)   . Sleep apnea    C PAP  . Thyroid cancer ([place]) 2014  . Vertigo     Family History  Problem Relation Age of Onset  . Cancer Father        colon cancer  . Arthritis Father        osteoarthritis  . Colon cancer Father   . Colon polyps Father   . Transient ischemic attack Father   . COPD Father   . Arthritis Mother        osteoarthritis  . Asthma Mother   .  Hypertension Mother   . Multiple sclerosis Mother   . Ovarian cancer Maternal Aunt   . Diabetes Paternal Aunt   . Diabetes Paternal Uncle   . Heart disease Maternal Grandfather     Past Surgical History:  Procedure Laterality Date  . CHOLECYSTECTOMY    . HEMORRHOID SURGERY    . INTRAUTERINE DEVICE INSERTION  2011  . SHOULDER SURGERY  06/2009  . THYROIDECTOMY  04/10/2012   Procedure: THYROIDECTOMY;  Surgeon: Jerrell Belfast, MD;  Location: [facility];  Service: ENT;  Laterality: N/A;  Total Thyroidectomy  . TONSILLECTOMY AND ADENOIDECTOMY  CHILD  . TUBAL LIGATION     Social History   Occupational History  . Occupation: [employer]  Tobacco Use  . Smoking status: Former Smoker    Packs/day: 0.20    Years: 5.00    Pack years: 1.00    Quit date: 03/25/2004    Years since quitting: 16.3  . Smokeless tobacco: Never Used  Substance and Sexual Activity  . Alcohol use: No    Alcohol/week: 0.0 standard drinks  . Drug use: No  . Sexual activity: Yes

## 2020-08-14 ENCOUNTER — Ambulatory Visit (INDEPENDENT_AMBULATORY_CARE_PROVIDER_SITE_OTHER): Payer: PRIVATE HEALTH INSURANCE | Admitting: Physician Assistant

## 2020-08-14 ENCOUNTER — Other Ambulatory Visit: Payer: Self-pay

## 2020-08-14 ENCOUNTER — Encounter: Payer: Self-pay | Admitting: Physician Assistant

## 2020-08-14 DIAGNOSIS — E1165 Type 2 diabetes mellitus with hyperglycemia: Secondary | ICD-10-CM

## 2020-08-14 DIAGNOSIS — E1142 Type 2 diabetes mellitus with diabetic polyneuropathy: Secondary | ICD-10-CM

## 2020-08-14 DIAGNOSIS — IMO0002 Reserved for concepts with insufficient information to code with codable children: Secondary | ICD-10-CM

## 2020-08-14 NOTE — Progress Notes (Signed)
Office Visit Note   Patient: [name]           Date of Birth: 03-Nov-1965           MRN: [id number] Visit Date: 08/14/2020              Requested by: [name], Monica Fanny, MD [place],  [place] [address] PCP: Monica Greenspan, MD  Chief Complaint  Patient presents with  . Right Ankle - Follow-up      HPI: Patient is a very pleasant 55 year old woman who follows up today for her left leg ulcer is on the lateral side of her calf and on her heel.  This initially started from using a petit egg too aggressively.  She has been wearing a compression sock and feels she is doing much better.  Assessment & Plan: Visit Diagnoses: No diagnosis found.  Plan: Patient was wondering about a taller compression sock and I have prescribed her a size large vive follow-up for a final visit hopefully in 3 weeks  Follow-Up Instructions: No follow-ups on file.   Ortho Exam  Patient is alert, oriented, no adenopathy, well-dressed, normal affect, normal respiratory effort. Examination she has minimal swelling no ascending cellulitis.  The heel wound is flat and flush against the skin without any callus.  She has an eschar covering the lateral calf wound which measures about 2 cm in diameter.  No signs of acute infection.  Imaging: No results found. No images are attached to the encounter.  Labs: Lab Results  Component Value Date   HGBA1C 10.8 (A) 12/09/2019   HGBA1C 10.2 (A) 11/27/2018   HGBA1C 9.6 (H) 08/14/2018   REPTSTATUS 03/28/2014 FINAL 03/22/2014   CULT  03/22/2014    NO GROWTH 5 DAYS Performed at [facility] Multiple bacterial morphotypes present, none 02/04/2012   LABORGA predominant. Suggest appropriate recollection if  02/04/2012   LABORGA clinically indicated. 02/04/2012     Lab Results  Component Value Date   ALBUMIN 4.4 09/29/2019   ALBUMIN 4.2 08/14/2018   ALBUMIN 4.2 08/01/2017    Lab Results  Component Value Date   MG 2.0  12/25/2017   MG 1.7 12/15/2017   MG 1.9 03/24/2014   Lab Results  Component Value Date   VD25OH 26.43 (L) 09/29/2019   VD25OH 10.1 (L) 09/29/2018    No results found for: PREALBUMIN CBC EXTENDED Latest Ref Rng & Units 09/29/2019 08/14/2018 08/01/2017  WBC 4.0 - 10.5 K/uL 8.0 7.1 8.2  RBC 3.87 - 5.11 Mil/uL 4.63 4.31 5.09  HGB 12.0 - 15.0 g/dL 13.8 12.7 13.9  HCT 36.0 - 46.0 % 41.9 38.3 42.9  PLT 150.0 - 400.0 K/uL 229.0 251.0 240.0  NEUTROABS 1.4 - 7.7 K/uL 5.3 4.6 5.8  LYMPHSABS 0.7 - 4.0 K/uL 2.0 1.8 1.5     There is no height or weight on file to calculate BMI.  Orders:  No orders of the defined types were placed in this encounter.  No orders of the defined types were placed in this encounter.    Procedures: No procedures performed  Clinical Data: No additional findings.  ROS:  All other systems negative, except as noted in the HPI. Review of Systems  Objective: Vital Signs: There were no vitals taken for this visit.  Specialty Comments:  No specialty comments available.  PMFS History: Patient Active Problem List   Diagnosis Date Noted  . Vitamin D deficiency 09/27/2019  . Lumbar  radiculopathy 12/16/2018  . Diabetes, polyneuropathy ([place]) 12/16/2018  . Bilateral carpal tunnel syndrome 11/24/2018  . Memory loss 09/29/2018  . Numbness 09/29/2018  . Family history of MS (multiple sclerosis) 09/29/2018  . Leg weakness, bilateral 08/10/2018  . Routine general medical examination at a health care facility 12/19/2013  . Palpitations 02/03/2013  . Nonspecific abnormal toxicology 10/29/2012  . 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 History of thyroid cancer 04/27/2012  . Cushing's syndrome ([place]) 04/27/2012  . Chronic diastolic heart failure ([place]) 02/10/2012  . Diastolic CHF ([place]) 60/12/9321  . OSA (obstructive sleep apnea) just got cpap at home this past week 01/17/12 01/17/2012  . Chronic allergic rhinitis 10/26/2010  . Insomnia 10/26/2010  . OBESITY,  MORBID 04/27/2010  . HEMORRHOIDS, INTERNAL, WITH BLEEDING 08/17/2009  . GERD 04/27/2008  . Essential hypertension 11/03/2007  . Hyperlipidemia associated with type 2 diabetes mellitus ([place]) 05/14/2007  . OTHER ORGANIC SLEEP DISORDERS 03/09/2007  . Uncontrolled type 2 diabetes mellitus with peripheral neuropathy ([place]) 09/11/2006  . POLYCYSTIC OVARIES 09/11/2006  . Iron deficiency anemia 09/11/2006  . Depression with anxiety 09/11/2006  . EXTERNAL HEMORRHOIDS 09/11/2006  . Asthma 09/11/2006   Past Medical History:  Diagnosis Date  . Allergy    SEASONAL  . Anemia    iron deficiency  . Anxiety   . Arthritis   . Asthma    a. 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  . Cellulitis and abscess of leg 02/2014  . CHF (congestive heart failure) ([place])   . Chronic kidney disease    /13  . Complication of anesthesia    DIFFICULT WAKING- 2011  . Depression    a. Stress reaction 08/2011 in multiple social stressors.  . Diabetes mellitus   . Dysrhythmia   . Gastritis   . GERD (gastroesophageal reflux disease)   . Heart murmur   . Hemorrhoid   . Hyperlipidemia   . Hypertension   . Hyperthyroidism   . Insomnia   . Morbid obesity ([place])   . OSA (obstructive sleep apnea) 01/17/2012  . Papillary thyroid carcinoma ([place])   . PCOS (polycystic ovarian syndrome)   . Sleep apnea    C PAP  . Thyroid cancer ([place]) 2014  . Vertigo     Family History  Problem Relation Age of Onset  . Cancer Father        colon cancer  . Arthritis Father        osteoarthritis  . Colon cancer Father   . Colon polyps Father   . Transient ischemic attack Father   . COPD Father   . Arthritis Mother        osteoarthritis  . Asthma Mother   . Hypertension Mother   . Multiple sclerosis Mother   . Ovarian cancer Maternal Aunt   . Diabetes Paternal Aunt   . Diabetes Paternal Uncle   . Heart disease Maternal Grandfather     Past Surgical History:  Procedure Laterality Date   . CHOLECYSTECTOMY    . HEMORRHOID SURGERY    . INTRAUTERINE DEVICE INSERTION  2011  . SHOULDER SURGERY  06/2009  . THYROIDECTOMY  04/10/2012   Procedure: THYROIDECTOMY;  Surgeon: Jerrell Belfast, MD;  Location: [place];  Service: ENT;  Laterality: N/A;  Total Thyroidectomy  . TONSILLECTOMY AND ADENOIDECTOMY  CHILD  . TUBAL LIGATION     Social History   Occupational History  . Occupation: [employer]  Tobacco Use  . Smoking status: Former  Smoker    Packs/day: 0.20    Years: 5.00    Pack years: 1.00    Quit date: 03/25/2004    Years since quitting: 16.4  . Smokeless tobacco: Never Used  Substance and Sexual Activity  . Alcohol use: No    Alcohol/week: 0.0 standard drinks  . Drug use: No  . Sexual activity: Yes

## 2020-09-04 ENCOUNTER — Encounter: Payer: Self-pay | Admitting: Orthopedic Surgery

## 2020-09-04 ENCOUNTER — Other Ambulatory Visit: Payer: Self-pay

## 2020-09-04 ENCOUNTER — Other Ambulatory Visit (HOSPITAL_COMMUNITY): Payer: Self-pay | Admitting: Cardiology

## 2020-09-04 ENCOUNTER — Ambulatory Visit (INDEPENDENT_AMBULATORY_CARE_PROVIDER_SITE_OTHER): Payer: PRIVATE HEALTH INSURANCE | Admitting: Orthopedic Surgery

## 2020-09-04 DIAGNOSIS — IMO0002 Reserved for concepts with insufficient information to code with codable children: Secondary | ICD-10-CM

## 2020-09-04 DIAGNOSIS — L97421 Non-pressure chronic ulcer of left heel and midfoot limited to breakdown of skin: Secondary | ICD-10-CM

## 2020-09-04 DIAGNOSIS — E1165 Type 2 diabetes mellitus with hyperglycemia: Secondary | ICD-10-CM

## 2020-09-04 DIAGNOSIS — E1142 Type 2 diabetes mellitus with diabetic polyneuropathy: Secondary | ICD-10-CM

## 2020-09-04 NOTE — Progress Notes (Signed)
Office Visit Note   Patient: [name]           Date of Birth: 08-Dec-1965           MRN: [id number] Visit Date: 09/04/2020              Requested by: Tower, Wynelle Fanny, MD [address] [place],  [place] [address] PCP: [name], MD  Chief Complaint  Patient presents with   Left Foot - Follow-up      HPI: Patient is a 55 year old woman who presents in follow-up for a left heel ulcer as well as a cystic mass left calf.  Assessment & Plan: Visit Diagnoses:  1. Uncontrolled type 2 diabetes mellitus with peripheral neuropathy ([place])   2. Non-pressure chronic ulcer of left heel and midfoot limited to breakdown of skin ([place])     Plan: The ulcer has healed the cystic mass has almost completely resolved.  Patient will follow-up as needed.  Follow-Up Instructions: Return if symptoms worsen or fail to improve.   Ortho Exam  Patient is alert, oriented, no adenopathy, well-dressed, normal affect, normal respiratory effort. Examination the ulcer on the left heel is essentially healed a 10 blade knife was used to pare the nonviable tissue there is no deep abscess there is good epithelization.  There is no cellulitis no drainage.  Examination the calf the area of the cystic mass is almost completely resolved there is no redness no cellulitis no tenderness to palpation no signs of infection.  Imaging: No results found. No images are attached to the encounter.  Labs: Lab Results  Component Value Date   HGBA1C 10.8 (A) 12/09/2019   HGBA1C 10.2 (A) 11/27/2018   HGBA1C 9.6 (H) 08/14/2018   REPTSTATUS 03/28/2014 FINAL 03/22/2014   CULT  03/22/2014    NO GROWTH 5 DAYS Performed at [facility] Multiple bacterial morphotypes present, none 02/04/2012   LABORGA predominant. Suggest appropriate recollection if  02/04/2012   LABORGA clinically indicated. 02/04/2012     Lab Results  Component Value Date   ALBUMIN 4.4 09/29/2019   ALBUMIN 4.2  08/14/2018   ALBUMIN 4.2 08/01/2017    Lab Results  Component Value Date   MG 2.0 12/25/2017   MG 1.7 12/15/2017   MG 1.9 03/24/2014   Lab Results  Component Value Date   VD25OH 26.43 (L) 09/29/2019   VD25OH 10.1 (L) 09/29/2018    No results found for: PREALBUMIN CBC EXTENDED Latest Ref Rng & Units 09/29/2019 08/14/2018 08/01/2017  WBC 4.0 - 10.5 K/uL 8.0 7.1 8.2  RBC 3.87 - 5.11 Mil/uL 4.63 4.31 5.09  HGB 12.0 - 15.0 g/dL 13.8 12.7 13.9  HCT 36.0 - 46.0 % 41.9 38.3 42.9  PLT 150.0 - 400.0 K/uL 229.0 251.0 240.0  NEUTROABS 1.4 - 7.7 K/uL 5.3 4.6 5.8  LYMPHSABS 0.7 - 4.0 K/uL 2.0 1.8 1.5     There is no height or weight on file to calculate BMI.  Orders:  No orders of the defined types were placed in this encounter.  No orders of the defined types were placed in this encounter.    Procedures: No procedures performed  Clinical Data: No additional findings.  ROS:  All other systems negative, except as noted in the HPI. Review of Systems  Objective: Vital Signs: There were no vitals taken for this visit.  Specialty Comments:  No specialty comments available.  PMFS History: Patient Active Problem List   Diagnosis  Date Noted   Vitamin D deficiency 09/27/2019   Lumbar radiculopathy 12/16/2018   Diabetes, polyneuropathy ([place]) 12/16/2018   Bilateral carpal tunnel syndrome 11/24/2018   Memory loss 09/29/2018   Numbness 09/29/2018   Family history of MS (multiple sclerosis) 09/29/2018   Leg weakness, bilateral 08/10/2018   Routine general medical examination at a health care facility 12/19/2013   Palpitations 02/03/2013   Nonspecific abnormal toxicology 10/29/2012   Postsurgical hypothyroidism 04/28/2012   History of thyroid cancer 04/27/2012   Cushing's syndrome ([place]) 04/27/2012   Chronic diastolic heart failure ([place]) 09/32/3557   Diastolic CHF ([place]) 32/20/2542   OSA (obstructive sleep apnea) just got cpap at home this past week 01/17/12 01/17/2012   Chronic  allergic rhinitis 10/26/2010   Insomnia 10/26/2010   OBESITY, MORBID 04/27/2010   HEMORRHOIDS, INTERNAL, WITH BLEEDING 08/17/2009   GERD 04/27/2008   Essential hypertension 11/03/2007   Hyperlipidemia associated with type 2 diabetes mellitus ([place]) 05/14/2007   OTHER ORGANIC SLEEP DISORDERS 03/09/2007   Uncontrolled type 2 diabetes mellitus with peripheral neuropathy ([place]) 09/11/2006   POLYCYSTIC OVARIES 09/11/2006   Iron deficiency anemia 09/11/2006   Depression with anxiety 09/11/2006   EXTERNAL HEMORRHOIDS 09/11/2006   Asthma 09/11/2006   Past Medical History:  Diagnosis Date   Allergy    SEASONAL   Anemia    iron deficiency   Anxiety    Arthritis    Asthma    a. 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   Cellulitis and abscess of leg 02/2014   CHF (congestive heart failure) ([place])    Chronic kidney disease    /70   Complication of anesthesia    DIFFICULT WAKING- 2011   Depression    a. Stress reaction 08/2011 in multiple social stressors.   Diabetes mellitus    Dysrhythmia    Gastritis    GERD (gastroesophageal reflux disease)    Heart murmur    Hemorrhoid    Hyperlipidemia    Hypertension    Hyperthyroidism    Insomnia    Morbid obesity (H[facility])    OSA (obstructive sleep apnea) 01/17/2012   Papillary thyroid carcinoma (HCC)    PCOS (polycystic ovarian syndrome)    Sleep apnea    C PAP   Thyroid cancer ([place]) 2014   Vertigo     Family History  Problem Relation Age of Onset   Cancer Father        colon cancer   Arthritis Father        osteoarthritis   Colon cancer Father    Colon polyps Father    Transient ischemic attack Father    COPD Father    Arthritis Mother        osteoarthritis   Asthma Mother    Hypertension Mother    Multiple sclerosis Mother    Ovarian cancer Maternal Aunt    Diabetes Paternal Aunt    Diabetes Paternal Uncle    Heart disease Maternal Grandfather     Past Surgical History:  Procedure  Laterality Date   CHOLECYSTECTOMY     HEMORRHOID SURGERY     INTRAUTERINE DEVICE INSERTION  2011   SHOULDER SURGERY  06/2009   THYROIDECTOMY  04/10/2012   Procedure: THYROIDECTOMY;  Surgeon: Jerrell Belfast, MD;  Location: [facility];  Service: ENT;  Laterality: N/A;  Total Thyroidectomy   TONSILLECTOMY AND ADENOIDECTOMY  CHILD   TUBAL LIGATION     Social History   Occupational History   Occupation:  [employer]  Tobacco Use   Smoking status: Former    Packs/day: 0.20    Years: 5.00    Pack years: 1.00    Types: Cigarettes    Quit date: 03/25/2004    Years since quitting: 16.4   Smokeless tobacco: Never  Substance and Sexual Activity   Alcohol use: No    Alcohol/week: 0.0 standard drinks   Drug use: No   Sexual activity: Yes

## 2020-09-06 ENCOUNTER — Other Ambulatory Visit (HOSPITAL_COMMUNITY): Payer: Self-pay | Admitting: Internal Medicine

## 2020-09-06 ENCOUNTER — Other Ambulatory Visit: Payer: Self-pay | Admitting: Family Medicine

## 2020-09-06 NOTE — Telephone Encounter (Signed)
Name of Medication: Lorrin Mais Name of Pharmacy: [facility] Last [place] or Written Date and Quantity: 05/03/20 #30 tabs/ 3 refills Last Office Visit and Type: CPE on 09/29/19 Next Office Visit and Type: none scheduled  :

## 2020-09-06 NOTE — Telephone Encounter (Signed)
Please schedule next PE Thanks

## 2020-09-06 NOTE — Telephone Encounter (Signed)
Patient is scheduled   

## 2020-09-12 NOTE — Progress Notes (Signed)
ADVANCED HEART FAILURE CLINIC NOTE  PCP: Dr. Glori Bickers Pulmonary: Dr. Lamonte Sakai ENT: Dr. Norma Fredrickson Endocrinologist: Dr. Renne Crigler HF Cardiologist: Dr. Haroldine Laws  HPI: [name] is a 55 y.o. female with hx of DM, HTN, HL, morbid obesity, OSA on CPAP , anxiety, diastolic HF with secondary pulmonary HTN and noncardiac CP.   Cath 2006 for CP with only catheter-induced spasm of RCA otherwise normal coronaries. EF was 65% at that time. Developed acute renal failure after CT contrast.   Nuclear Stress 02/17/12: Normal, EF 58%  Echo 10/24: EF 50-55%. Grade 2 diastolic dysfunction. RV mildly dilated. PAPP 68 mmHg   Echo 5/14: EF 54% Grade 2 diastolic dysfunction. RV looks good. No significant TR to measure PA pressure.   Echo 7/15: EF 55-60% Normal RV  Myoview 7/15: RF and perfusion normal   S/P Thyroidectomy January 2014.   Zio Patch 12/2017 8% PVCs.  Follow up in HF clinic 12/2017 with NYHA II symptoms, echo suggesting moderate PH (likely WHO Group II & III).   Today she returns for HF follow up. She has not been seen since 06/2018. Overall feeling fine. Followed by Dr. Sharol Given for left heel ulcer w/ cellulitis, resolved now. SOB with stairs and lifting groceries, feels this is her baseline. Dizzy when she stands, has had vertigo. Has palpitations and fluttering in chest, also with CP occurs 2x/month, resolves when laying down. Denies worsening SOB PND/Orthopnea. Appetite ok. No fever or chills. Not weighing at home. Taking all medications. Using CPAP every night. Works as Lawyer for [employer]. Lots of life stressors right now.  Review of systems complete and found to be negative unless listed in HPI.   Past Medical History:  Diagnosis Date   Allergy    SEASONAL   Anemia    iron deficiency   Anxiety    Arthritis    Asthma    a. 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   Cellulitis and abscess of leg 02/2014    CHF (congestive heart failure) ([place])    Chronic kidney disease    /62   Complication of anesthesia    DIFFICULT WAKING- 2011   Depression    a. Stress reaction 08/2011 in multiple social stressors.   Diabetes mellitus    Dysrhythmia    Gastritis    GERD (gastroesophageal reflux disease)    Heart murmur    Hemorrhoid    Hyperlipidemia    Hypertension    Hyperthyroidism    Insomnia    Morbid obesity (HCC)    OSA (obstructive sleep apnea) 01/17/2012   Papillary thyroid carcinoma (HCC)    PCOS (polycystic ovarian syndrome)    Sleep apnea    C PAP   Thyroid cancer ([place]) 2014   Vertigo     Current Outpatient Medications  Medication Sig Dispense Refill   Albuterol Sulfate (PROAIR RESPICLICK) 703 (90 Base) MCG/ACT AEPB Inhale 2 puffs into the lungs every 6 (six) hours as needed for up to 6 doses. 1 each 1   buPROPion (WELLBUTRIN XL) 150 MG 24 hr tablet Take 1 tablet (150 mg total) by mouth daily. 90 tablet 3   carvedilol (COREG) 3.125 MG tablet TAKE 1 TABLET (3.125 MG TOTAL) BY MOUTH 2 (TWO) TIMES DAILY WITH A MEAL. MUST BE SEEN IN OFFICE FOR FURTHER REFILLS 30 tablet 0   cetirizine (ZYRTEC) 10 MG tablet Take 1 tablet (10 mg total) by mouth daily. 90 tablet 3  Continuous Blood Gluc Sensor (FREESTYLE LIBRE 14 DAY SENSOR) MISC APPLY 1 SENSOR BY SUBDERMAL ROUTE EVERY 14 DAYS. 2 each 11   empagliflozin (JARDIANCE) 25 MG TABS tablet Take 1 tablet (25 mg total) by mouth daily before breakfast. 90 tablet 3   ferrous sulfate 325 (65 FE) MG tablet Take 325 mg by mouth 2 (two) times daily.       fluticasone (FLONASE) 50 MCG/ACT nasal spray Place 2 sprays into both nostrils daily as needed for allergies. 16 g 11   glucose blood (ONE TOUCH ULTRA TEST) test strip Use 2x a day 100 each 2   Insulin Pen Needle (B-D ULTRAFINE III SHORT PEN) 31G X 8 MM MISC USE ONCE DAILY 100 each 3   Insulin Pen Needle (BD PEN NEEDLE NANO 2ND GEN) 32G X 4 MM MISC Use once daily to check blood sugar 100 each 3    Insulin Pen Needle 32G X 4 MM MISC Use 4x a day 300 each 3   levothyroxine (SYNTHROID) 175 MCG tablet TAKE 1 TABLET BY MOUTH EVERY DAY BEFORE BREAKFAST 90 tablet 3   metFORMIN (GLUCOPHAGE) 1000 MG tablet TAKE 1 TABLET BY MOUTH TWICE A DAY WITH MEALS 180 tablet 3   pantoprazole (PROTONIX) 40 MG tablet Take 1 tablet (40 mg total) by mouth daily. 90 tablet 3   potassium chloride (KLOR-CON) 20 MEQ packet Take 20 mEq by mouth as needed.     sertraline (ZOLOFT) 100 MG tablet TAKE 1.5 TABLETS BY MOUTH DAILY 135 tablet 3   simvastatin (ZOCOR) 20 MG tablet TAKE 1 TABLET BY MOUTH EVERY DAY IN THE EVENING 90 tablet 3   spironolactone (ALDACTONE) 25 MG tablet TAKE 1 TABLET BY MOUTH DAILY. LAST REFILL WITHOUT OFFICE VISIT PLEASE CALL [phone] FINAL NOTICE 30 tablet 2   torsemide (DEMADEX) 20 MG tablet Take 1 tablet (20 mg total) by mouth daily as needed. 30 tablet 6   TOUJEO SOLOSTAR 300 UNIT/ML Solostar Pen INJECT 90 UNITS INTO THE SKIN AT BEDTIME. 9 mL 3   TRAVATAN Z 0.004 % SOLN ophthalmic solution Place 1 drop into both eyes at bedtime.      Vitamin D, Ergocalciferol, (DRISDOL) 1.25 MG (50000 UNIT) CAPS capsule TAKE 1 CAPSULE BY MOUTH EVERY 7 DAYS 13 capsule 4   zolpidem (AMBIEN) 10 MG tablet TAKE 1/2 TO 1 TABLET BY MOUTH AT BEDTIME AS NEEDED 30 tablet 3   No current facility-administered medications for this encounter.   Wt Readings from Last 3 Encounters:  09/13/20 118.8 kg (262 lb)  01/19/20 117.4 kg (258 lb 12.8 oz)  12/09/19 117.9 kg (260 lb)   Vitals:   09/13/20 0916  BP: (!) 160/80  Pulse: 64  SpO2: 96%  Weight: 118.8 kg (262 lb)   PHYSICAL EXAM General:  NAD. No resp difficulty HEENT: Normal Neck: Supple. Thick neck. Carotids 2+ bilat; no bruits. No lymphadenopathy or thryomegaly appreciated. Cor: PMI nondisplaced. Regular rate & rhythm. No rubs, gallops or murmurs. Lungs: Clear Abdomen: Obese, soft, nontender, nondistended. No hepatosplenomegaly. No bruits or masses. Good bowel  sounds. Extremities: No cyanosis, clubbing, rash, edema Neuro: Alert & oriented x 3, cranial nerves grossly intact. Moves all 4 extremities w/o difficulty. Tearful.  ECG: SR 63 bpm (personally reviewed). Appears unchanged from last ECG.  ASSESSMENT & PLAN: 1. Chronic diastolic heart failure - Previous echo consistent with at least moderate PH. (likely WHO Group II and III), per Dr. Haroldine Laws - Previously had TR on exam. Echo 08/2016 unable to visualize tricuspid  valve, EF 60-65%.  - Echo (10/19): EF 60-65%, Grade II DD - NYHA II, functional class difficult due to body habitus and general physical inactivity. Volume good today. - Continue torsemide 20 mg daily as needed.  - Continue spironolactone 25 mg daily. - Continue carvedilol 3.125 mg bid.  - Continue Jardiance 25 mg daily. - BMET today.   2. Pulmonary HTN - As above. No change.   3. Chest pain - Cath 2006 and myoview in 2013 & 2015 ok.  - Occasional CP with activity recently, resolves with rest. Has been under a lot of family stress. - ECG reassuring. - Reasonable to repeat Lexiscan.  4. HTN - Elevated today but she has not taken morning meds yet. - Instructed to check BP at home and bring log to next appt. - May need to re-start losartan if remains elevated.  5. Palpitations - Only 8% PVCs on Ziopatch 12/2017. - Continues to have palpitations, but aren't as severe. - ECG ok today. - Place Zio 14 days to quantify PVC burden.   6. DM 2, uncontrolled - Followed by Dr. Cruzita Lederer. - a1c 10.8 (9/21).  7. OSA  - Continue CPAP. She is compliant.  8. Obesity - Body mass index is 43.6 kg/m. -  Needs weight loss.  9. Probable PFO on TTE - This is physiologic and seen in 30% of people. Right side is normal in size. No embolic phenomenon.  - No further w/u needed.   10. S/p thyroidectomy -> Hypothyroidism on synthroid - Followed by endocrinologist (Dr Cruzita Lederer).  11. Dizziness - Sounds like vertigo. Has tried meclizine  in past. - Follow up with PCP.  Follow up in 4-6 weeks to reassess BP  [place] FNP 09/13/20

## 2020-09-13 ENCOUNTER — Ambulatory Visit (HOSPITAL_COMMUNITY)
Admission: RE | Admit: 2020-09-13 | Discharge: 2020-09-13 | Disposition: A | Discharge status: Home / Self Care | Payer: PRIVATE HEALTH INSURANCE | Source: Ambulatory Visit | Attending: Family Medicine | Admitting: Family Medicine

## 2020-09-13 ENCOUNTER — Encounter (HOSPITAL_COMMUNITY): Payer: Self-pay | Admitting: Family Medicine

## 2020-09-13 ENCOUNTER — Ambulatory Visit (HOSPITAL_COMMUNITY)
Admission: RE | Admit: 2020-09-13 | Discharge: 2020-09-13 | Disposition: A | Discharge status: Home / Self Care | Payer: No Typology Code available for payment source | Source: Ambulatory Visit | Attending: Family Medicine | Admitting: Family Medicine

## 2020-09-13 ENCOUNTER — Other Ambulatory Visit: Payer: Self-pay

## 2020-09-13 ENCOUNTER — Encounter (HOSPITAL_COMMUNITY): Payer: Self-pay

## 2020-09-13 ENCOUNTER — Encounter: Payer: Self-pay | Admitting: Family Medicine

## 2020-09-13 VITALS — BP 160/80 | HR 64 | Wt 262.0 lb

## 2020-09-13 DIAGNOSIS — F419 Anxiety disorder, unspecified: Secondary | ICD-10-CM | POA: Diagnosis not present

## 2020-09-13 DIAGNOSIS — Q211 Atrial septal defect: Secondary | ICD-10-CM

## 2020-09-13 DIAGNOSIS — Z794 Long term (current) use of insulin: Secondary | ICD-10-CM | POA: Insufficient documentation

## 2020-09-13 DIAGNOSIS — R42 Dizziness and giddiness: Secondary | ICD-10-CM | POA: Diagnosis present

## 2020-09-13 DIAGNOSIS — E1122 Type 2 diabetes mellitus with diabetic chronic kidney disease: Secondary | ICD-10-CM | POA: Insufficient documentation

## 2020-09-13 DIAGNOSIS — Z79899 Other long term (current) drug therapy: Secondary | ICD-10-CM | POA: Diagnosis not present

## 2020-09-13 DIAGNOSIS — E118 Type 2 diabetes mellitus with unspecified complications: Secondary | ICD-10-CM

## 2020-09-13 DIAGNOSIS — R079 Chest pain, unspecified: Secondary | ICD-10-CM | POA: Diagnosis not present

## 2020-09-13 DIAGNOSIS — I272 Pulmonary hypertension, unspecified: Secondary | ICD-10-CM | POA: Diagnosis not present

## 2020-09-13 DIAGNOSIS — R0602 Shortness of breath: Secondary | ICD-10-CM | POA: Diagnosis not present

## 2020-09-13 DIAGNOSIS — Z7901 Long term (current) use of anticoagulants: Secondary | ICD-10-CM | POA: Insufficient documentation

## 2020-09-13 DIAGNOSIS — Z638 Other specified problems related to primary support group: Secondary | ICD-10-CM | POA: Diagnosis not present

## 2020-09-13 DIAGNOSIS — N189 Chronic kidney disease, unspecified: Secondary | ICD-10-CM | POA: Diagnosis not present

## 2020-09-13 DIAGNOSIS — E785 Hyperlipidemia, unspecified: Secondary | ICD-10-CM | POA: Insufficient documentation

## 2020-09-13 DIAGNOSIS — I5032 Chronic diastolic (congestive) heart failure: Secondary | ICD-10-CM | POA: Insufficient documentation

## 2020-09-13 DIAGNOSIS — Z6841 Body Mass Index (BMI) 40.0 and over, adult: Secondary | ICD-10-CM | POA: Diagnosis not present

## 2020-09-13 DIAGNOSIS — Q2112 Patent foramen ovale: Secondary | ICD-10-CM

## 2020-09-13 DIAGNOSIS — Z7984 Long term (current) use of oral hypoglycemic drugs: Secondary | ICD-10-CM | POA: Diagnosis not present

## 2020-09-13 DIAGNOSIS — R002 Palpitations: Secondary | ICD-10-CM | POA: Diagnosis not present

## 2020-09-13 DIAGNOSIS — E038 Other specified hypothyroidism: Secondary | ICD-10-CM

## 2020-09-13 DIAGNOSIS — E890 Postprocedural hypothyroidism: Secondary | ICD-10-CM | POA: Diagnosis not present

## 2020-09-13 DIAGNOSIS — G4733 Obstructive sleep apnea (adult) (pediatric): Secondary | ICD-10-CM | POA: Insufficient documentation

## 2020-09-13 DIAGNOSIS — Z9989 Dependence on other enabling machines and devices: Secondary | ICD-10-CM

## 2020-09-13 DIAGNOSIS — I130 Hypertensive heart and chronic kidney disease with heart failure and stage 1 through stage 4 chronic kidney disease, or unspecified chronic kidney disease: Secondary | ICD-10-CM | POA: Diagnosis not present

## 2020-09-13 LAB — BASIC METABOLIC PANEL
Anion gap: 7 (ref 5–15)
BUN: 12 mg/dL (ref 6–20)
CO2: 28 mmol/L (ref 22–32)
Calcium: 8.7 mg/dL — ABNORMAL LOW (ref 8.9–10.3)
Chloride: 103 mmol/L (ref 98–111)
Creatinine, Ser: 0.68 mg/dL (ref 0.44–1.00)
GFR, Estimated: >60 mL/min (ref >60)
Glucose, Bld: 193 mg/dL — ABNORMAL HIGH (ref 70–99)
Potassium: 3.7 mmol/L (ref 3.5–5.1)
Sodium: 138 mmol/L (ref 135–145)

## 2020-09-13 LAB — BRAIN NATRIURETIC PEPTIDE: B Natriuretic Peptide: 180.3 pg/mL — ABNORMAL HIGH (ref 0.0–100.0)

## 2020-09-13 LAB — MAGNESIUM: Magnesium: 1.6 mg/dL — ABNORMAL LOW (ref 1.7–2.4)

## 2020-09-13 NOTE — Patient Instructions (Signed)
It was great to see you today! No medication changes are needed at this time.   Labs today We will only contact you if something comes back abnormal or we need to make some changes. Otherwise no news is good news!  Your provider has recommended that  you wear a Zio Patch for 14 days.  This monitor will record your heart rhythm for our review.  IF you have any symptoms while wearing the monitor please press the button.  If you have any issues with the patch or you notice a red or orange light on it please call the company at [phone].  Once you remove the patch please mail it back to the company as soon as possible so we can get the results.    Your physician recommends that you schedule a follow-up appointment in: 4-6 weeks  in the [facility] Practition[facility] (PA/NP) Clinic     How to Prepare for Your Myoview Test (stress test):  Please do not take these medications before your test: torsemide Your remaining medications may be taken with water. Nothing to eat or drink, except water, 4 hours prior to arrival time.  NO caffeine/decaffeinated products, or chocolate 12 hours prior to arrival. [place], please do not wear dresses.  Skirts or pants are approprate, please wear a short sleeve shirt. NO perfume, cologne or lotion Wear comfortable walking shoes.  NO HEELS! Total time is 3 to 4 hours; you may want to bring reading material for the waiting time. Please report to [facility] for your test  What to expect after you arrive:  Once you arrive and check in for your appointment an IV will be started in your arm.  Then the Technoligist will inject a small amount of radioactive tracer.  There will be a 1 hour waiting period after this injection.  A series of pictures will be taken of your heart following this waiting period.  You will be prepped for the stress portion of the test.  During the stress portion of your test you will either walk on a treadmill or receive a small, safe amount of  radioactive tracer injected in your IV.  After the stress portion, there is a short rest period during which time your heart and blood pressure will be monitored.  After the short rest period the Technologist will begin your second set of pictures.  Your doctor will inform you of your test results within 7-10 business days.  In preparation for your appointment, medication and supplies will be purchased.  Appointment availability is limited, so if you need to cancel or reschedule please call the office at [phone] 24 hours in advance to avoid a cancellation fee of $100.00  IF [place], [place] TECHNOLOGIST.

## 2020-09-14 ENCOUNTER — Other Ambulatory Visit (HOSPITAL_COMMUNITY): Payer: Self-pay | Admitting: Internal Medicine

## 2020-09-14 ENCOUNTER — Encounter (HOSPITAL_COMMUNITY): Payer: Self-pay

## 2020-09-20 ENCOUNTER — Encounter: Payer: Self-pay | Admitting: Gastroenterology

## 2020-09-27 ENCOUNTER — Other Ambulatory Visit: Payer: Self-pay | Admitting: Internal Medicine

## 2020-10-03 ENCOUNTER — Telehealth (HOSPITAL_COMMUNITY): Payer: Self-pay | Admitting: *Deleted

## 2020-10-03 ENCOUNTER — Encounter (HOSPITAL_COMMUNITY): Payer: Self-pay | Admitting: *Deleted

## 2020-10-03 NOTE — Telephone Encounter (Signed)
Patient given detailed instructions per Myocardial Perfusion Study Information Sheet for the test on 10/09/20 at 1315. Patient notified to arrive 15 minutes early and that it is imperative to arrive on time for appointment to keep from having the test rescheduled.  If you need to cancel or reschedule your appointment, please call the office within 24 hours of your appointment. . Patient verbalized understanding.[place] Mychart letter sent with instructions

## 2020-10-09 ENCOUNTER — Other Ambulatory Visit: Payer: Self-pay

## 2020-10-09 ENCOUNTER — Ambulatory Visit (HOSPITAL_COMMUNITY): Payer: No Typology Code available for payment source | Attending: Family Medicine

## 2020-10-09 DIAGNOSIS — R079 Chest pain, unspecified: Secondary | ICD-10-CM | POA: Diagnosis present

## 2020-10-09 MED ORDER — TECHNETIUM TC 99M TETROFOSMIN IV KIT
32.6000 | PACK | Freq: Once | INTRAVENOUS | Status: AC | PRN
  Administered 2020-10-09: 32.6 via INTRAVENOUS
  Filled 2020-10-09: qty 33

## 2020-10-09 MED ORDER — REGADENOSON 0.4 MG/5ML IV SOLN
0.4000 mg | Freq: Once | INTRAVENOUS | Status: AC
  Administered 2020-10-09: 0.4 mg via INTRAVENOUS

## 2020-10-09 NOTE — Addendum Note (Signed)
Encounter addended by: Micki Riley, RN on: 10/09/2020 3:13 PM  Actions taken: Imaging Exam ended

## 2020-10-10 ENCOUNTER — Ambulatory Visit (HOSPITAL_COMMUNITY): Payer: No Typology Code available for payment source | Attending: Cardiology

## 2020-10-10 LAB — MYOCARDIAL PERFUSION IMAGING
LV dias vol: 99 mL (ref 46–106)
LV sys vol: 46 mL
Peak HR: 85 {beats}/min
Rest HR: 75 {beats}/min
SDS: 3
SRS: 2
SSS: 5
TID: 0.89

## 2020-10-10 MED ORDER — TECHNETIUM TC 99M TETROFOSMIN IV KIT
31.5000 | PACK | Freq: Once | INTRAVENOUS | Status: AC | PRN
  Administered 2020-10-10: 31.5 via INTRAVENOUS
  Filled 2020-10-10: qty 32

## 2020-10-11 ENCOUNTER — Encounter (HOSPITAL_COMMUNITY): Payer: Self-pay | Admitting: Cardiology

## 2020-10-17 ENCOUNTER — Other Ambulatory Visit (HOSPITAL_COMMUNITY): Payer: Self-pay | Admitting: Internal Medicine

## 2020-10-18 ENCOUNTER — Encounter: Payer: PRIVATE HEALTH INSURANCE | Admitting: Family Medicine

## 2020-10-18 ENCOUNTER — Encounter (HOSPITAL_COMMUNITY): Payer: PRIVATE HEALTH INSURANCE

## 2020-10-27 ENCOUNTER — Other Ambulatory Visit: Payer: Self-pay | Admitting: Family Medicine

## 2020-10-27 ENCOUNTER — Other Ambulatory Visit: Payer: Self-pay | Admitting: Internal Medicine

## 2020-10-28 NOTE — Progress Notes (Signed)
ADVANCED HEART FAILURE CLINIC NOTE  PCP: Dr. Glori Bickers Pulmonary: Dr. Lamonte Sakai ENT: Dr. Norma Fredrickson Endocrinologist: Dr. Renne Crigler HF Cardiologist: Dr. Haroldine Laws  HPI: [name] is a 54 y.o. female with hx of DM, HTN, HL, morbid obesity, OSA on CPAP , anxiety, diastolic HF with secondary pulmonary HTN and noncardiac CP.   Cath 2006 for CP with only catheter-induced spasm of RCA otherwise normal coronaries. EF was 65% at that time. Developed acute renal failure after CT contrast.   Nuclear Stress 02/17/12: Normal, EF 58%  Echo 10/24: EF 50-55%. Grade 2 diastolic dysfunction. RV mildly dilated. PAPP 68 mmHg   Echo 5/14: EF 123456 Grade 2 diastolic dysfunction. RV looks good. No significant TR to measure PA pressure.   Echo 7/15: EF 55-60% Normal RV  Myoview 7/15: RF and perfusion normal   S/P Thyroidectomy January 2014.   Zio Patch 12/2017 8% PVCs.  Follow up in HF clinic 12/2017 with NYHA II symptoms, echo suggesting moderate PH (likely WHO Group II & III).   She was lost to follow up and presented back to AHF 6/22 w/ atypical chest pain and palpitations. Zio (6/22) no high-grade arrhythmias, mostly SR rare extra beats. Lexiscan myoview ordered for atypical chest pain-->low risk study, EF 53%.  Today she returns for HF follow up. Overall feeling ok, still struggling with dizziness. Feels like she has some fluid in her ears. Has appt with PCP soon about this. No further CP or palpitations. Blood sugars still "all over the place." Denies increasing SOB, edema, or PND/Orthopnea. Weight at home stable. Taking all medications. Works as Lawyer for [employer]. Lots of life stressors.  BP at home 160s/70s. Has not need her prn torsemide. She took herself off losartan awhile ago because it made her light-headed.  Review of systems complete and found to be negative unless listed in HPI.   Past Medical History:  Diagnosis Date   Allergy    SEASONAL   Anemia     iron deficiency   Anxiety    Arthritis    Asthma    a. 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   Cellulitis and abscess of leg 02/2014   CHF (congestive heart failure) ([place])    Chronic kidney disease    [id number]   Complication of anesthesia    DIFFICULT WAKING- 2011   Depression    a. Stress reaction 08/2011 in multiple social stressors.   Diabetes mellitus    Dysrhythmia    Gastritis    GERD (gastroesophageal reflux disease)    Heart murmur    Hemorrhoid    Hyperlipidemia    Hypertension    Hyperthyroidism    Insomnia    Morbid obesity (H[facility])    OSA (obstructive sleep apnea) 01/17/2012   Papillary thyroid carcinoma (HCC)    PCOS (polycystic ovarian syndrome)    Sleep apnea    C PAP   Thyroid cancer ([place]) 2014   Vertigo     Current Outpatient Medications  Medication Sig Dispense Refill   Albuterol Sulfate (PROAIR RESPICLICK) 123XX123 (90 Base) MCG/ACT AEPB Inhale 2 puffs into the lungs every 6 (six) hours as needed for up to 6 doses. 1 each 1   buPROPion (WELLBUTRIN XL) 150 MG 24 hr tablet TAKE 1 TABLET BY MOUTH EVERY DAY 90 tablet 0   carvedilol (COREG) 3.125 MG tablet TAKE 1 TABLET BY MOUTH TWICE A DAY WITH A MEAL,NEED OFFICE VISIT 30 tablet 11  cetirizine (ZYRTEC) 10 MG tablet TAKE 1 TABLET BY MOUTH EVERY DAY 90 tablet 0   Continuous Blood Gluc Sensor (FREESTYLE LIBRE 14 DAY SENSOR) MISC APPLY 1 SENSOR BY SUBDERMAL ROUTE EVERY 14 DAYS. 2 each 11   empagliflozin (JARDIANCE) 25 MG TABS tablet Take 1 tablet (25 mg total) by mouth daily before breakfast. 90 tablet 3   ferrous sulfate 325 (65 FE) MG tablet Take 325 mg by mouth 2 (two) times daily.       fluticasone (FLONASE) 50 MCG/ACT nasal spray Place 2 sprays into both nostrils daily as needed for allergies. 16 g 11   glucose blood (ONE TOUCH ULTRA TEST) test strip Use 2x a day 100 each 2   Insulin Pen Needle (B-D ULTRAFINE III SHORT PEN) 31G X 8 MM MISC USE ONCE DAILY 100 each 3   Insulin  Pen Needle (BD PEN NEEDLE NANO 2ND GEN) 32G X 4 MM MISC Use once daily to check blood sugar 100 each 3   Insulin Pen Needle 32G X 4 MM MISC Use 4x a day 300 each 3   levothyroxine (SYNTHROID) 175 MCG tablet TAKE 1 TABLET BY MOUTH EVERY DAY BEFORE BREAKFAST 90 tablet 3   metFORMIN (GLUCOPHAGE) 1000 MG tablet TAKE 1 TABLET BY MOUTH TWICE A DAY WITH MEALS 180 tablet 3   pantoprazole (PROTONIX) 40 MG tablet TAKE 1 TABLET BY MOUTH EVERY DAY 90 tablet 0   potassium chloride (KLOR-CON) 20 MEQ packet Take 20 mEq by mouth as needed.     sertraline (ZOLOFT) 100 MG tablet TAKE 1.5 TABLETS BY MOUTH DAILY 135 tablet 3   simvastatin (ZOCOR) 20 MG tablet TAKE 1 TABLET BY MOUTH EVERY DAY IN THE EVENING 90 tablet 0   spironolactone (ALDACTONE) 25 MG tablet TAKE 1 TABLET BY MOUTH DAILY. LAST REFILL WITHOUT OFFICE VISIT PLEASE CALL [phone] FINAL NOTICE 30 tablet 2   torsemide (DEMADEX) 20 MG tablet Take 1 tablet (20 mg total) by mouth daily as needed. 30 tablet 6   TOUJEO SOLOSTAR 300 UNIT/ML Solostar Pen INJECT 90 UNITS INTO THE SKIN AT BEDTIME. 15 mL 0   TRAVATAN Z 0.004 % SOLN ophthalmic solution Place 1 drop into both eyes at bedtime.      Vitamin D, Ergocalciferol, (DRISDOL) 1.25 MG (50000 UNIT) CAPS capsule TAKE 1 CAPSULE BY MOUTH EVERY 7 DAYS 13 capsule 4   zolpidem (AMBIEN) 10 MG tablet TAKE 1/2 TO 1 TABLET BY MOUTH AT BEDTIME AS NEEDED 30 tablet 3   No current facility-administered medications for this encounter.   Wt Readings from Last 3 Encounters:  10/30/20 119.8 kg (264 lb 3.2 oz)  10/09/20 118.8 kg (262 lb)  09/13/20 118.8 kg (262 lb)   BP (!) 166/70   Pulse 71   Wt 119.8 kg (264 lb 3.2 oz)   SpO2 98%   BMI 43.97 kg/m   PHYSICAL EXAM General:  NAD. No resp difficulty HEENT: Normal Neck: Supple. thick neck, JVP difficult. Carotids 2+ bilat; no bruits. No lymphadenopathy or thryomegaly appreciated. Cor: PMI nondisplaced. Regular rate & rhythm. No rubs, gallops or murmurs. Lungs:  Clear Abdomen: Obese, nontender, nondistended. No hepatosplenomegaly. No bruits or masses. Good bowel sounds. Extremities: No cyanosis, clubbing, rash, edema Neuro: Alert & oriented x 3, cranial nerves grossly intact. Moves all 4 extremities w/o difficulty. Tearful.  ASSESSMENT & PLAN: 1. Chronic Diastolic Heart Failure - Previous echo consistent with at least moderate PH. (likely WHO Group II and III), per Dr. Haroldine Laws - Previously had TR  on exam. Echo 08/2016 unable to visualize tricuspid valve, EF 60-65%.  - Echo (10/19): EF 60-65%, Grade II DD - Myoview (7/22): EF 53% (personally discussed with Dr. Haroldine Laws, no repeat echo warranted at this time). - NYHA II, functional class difficult due to body habitus and general physical inactivity. Volume good today. - Consider Entresto, but will hold off for now since she says losartan made her light-headed. - Increase carvedilol to 6.25 mg bid.  - Continue torsemide 20 mg PRN - Continue spironolactone 25 mg daily. - Continue Jardiance 25 mg daily. - BMET & BNP today.   2. Pulmonary HTN - As above. No change.   3. Chest pain - Cath 2006 and myoview in 2013 & 2015 ok.  Carl[facility] Adam myoview (7/22): normal, low risk study. EF 53%. - No further CP.  4. HTN - Elevated today but she has not taken morning meds yet. - Instructed again to check BP at home and bring log to next appt. Rx given for BP cuff. - Consider addition of Entresto if she is agreeable. - Compliant w/ CPAP. - Encouraged her to find ways to cope with her life stressors and practice self-care.  5. Palpitations - Only 8% PVCs on Ziopatch 12/2017. - Zio 14 day (7/22): SR, rare extra beats, no high-grade arrhythmias. - No further symptoms.  6. DM 2, uncontrolled - Followed by Dr. Cruzita Lederer. - a1c 10.8 (9/21).  7. OSA  - She is compliant with CPAP.  8. Obesity - Body mass index is 43.97 kg/m. - Needs weight loss. Encouraged daily physical activity.  9. Probable PFO on  TTE - This is physiologic and seen in 30% of people. Right side is normal in size. No embolic phenomenon.  - No further w/u needed.   10. S/p thyroidectomy -> Hypothyroidism on synthroid - Followed by endocrinologist (Dr Cruzita Lederer).  11. Dizziness - Sounds like vertigo. Has tried meclizine in past. - Follow up with PCP.  Follow up 4 months with Dr. Haroldine Laws.  [place] FNP 10/30/20

## 2020-10-30 ENCOUNTER — Encounter (HOSPITAL_COMMUNITY): Payer: Self-pay

## 2020-10-30 ENCOUNTER — Other Ambulatory Visit: Payer: Self-pay

## 2020-10-30 ENCOUNTER — Ambulatory Visit (HOSPITAL_COMMUNITY)
Admission: RE | Admit: 2020-10-30 | Discharge: 2020-10-30 | Disposition: A | Discharge status: Home / Self Care | Payer: No Typology Code available for payment source | Source: Ambulatory Visit | Attending: Family Medicine | Admitting: Family Medicine

## 2020-10-30 VITALS — BP 166/70 | HR 71 | Wt 264.2 lb

## 2020-10-30 DIAGNOSIS — Z6841 Body Mass Index (BMI) 40.0 and over, adult: Secondary | ICD-10-CM | POA: Diagnosis not present

## 2020-10-30 DIAGNOSIS — Z79890 Hormone replacement therapy (postmenopausal): Secondary | ICD-10-CM | POA: Insufficient documentation

## 2020-10-30 DIAGNOSIS — Z9989 Dependence on other enabling machines and devices: Secondary | ICD-10-CM

## 2020-10-30 DIAGNOSIS — I5032 Chronic diastolic (congestive) heart failure: Secondary | ICD-10-CM

## 2020-10-30 DIAGNOSIS — Z794 Long term (current) use of insulin: Secondary | ICD-10-CM | POA: Diagnosis not present

## 2020-10-30 DIAGNOSIS — G4733 Obstructive sleep apnea (adult) (pediatric): Secondary | ICD-10-CM

## 2020-10-30 DIAGNOSIS — F419 Anxiety disorder, unspecified: Secondary | ICD-10-CM | POA: Insufficient documentation

## 2020-10-30 DIAGNOSIS — E890 Postprocedural hypothyroidism: Secondary | ICD-10-CM | POA: Diagnosis not present

## 2020-10-30 DIAGNOSIS — N189 Chronic kidney disease, unspecified: Secondary | ICD-10-CM | POA: Diagnosis not present

## 2020-10-30 DIAGNOSIS — I272 Pulmonary hypertension, unspecified: Secondary | ICD-10-CM | POA: Diagnosis not present

## 2020-10-30 DIAGNOSIS — E1122 Type 2 diabetes mellitus with diabetic chronic kidney disease: Secondary | ICD-10-CM | POA: Diagnosis not present

## 2020-10-30 DIAGNOSIS — I130 Hypertensive heart and chronic kidney disease with heart failure and stage 1 through stage 4 chronic kidney disease, or unspecified chronic kidney disease: Secondary | ICD-10-CM | POA: Diagnosis not present

## 2020-10-30 DIAGNOSIS — E038 Other specified hypothyroidism: Secondary | ICD-10-CM

## 2020-10-30 DIAGNOSIS — R42 Dizziness and giddiness: Secondary | ICD-10-CM | POA: Diagnosis not present

## 2020-10-30 DIAGNOSIS — R079 Chest pain, unspecified: Secondary | ICD-10-CM | POA: Diagnosis not present

## 2020-10-30 DIAGNOSIS — E118 Type 2 diabetes mellitus with unspecified complications: Secondary | ICD-10-CM

## 2020-10-30 DIAGNOSIS — Z79899 Other long term (current) drug therapy: Secondary | ICD-10-CM | POA: Diagnosis not present

## 2020-10-30 DIAGNOSIS — R002 Palpitations: Secondary | ICD-10-CM | POA: Diagnosis not present

## 2020-10-30 DIAGNOSIS — Q2112 Patent foramen ovale: Secondary | ICD-10-CM

## 2020-10-30 DIAGNOSIS — E1165 Type 2 diabetes mellitus with hyperglycemia: Secondary | ICD-10-CM | POA: Diagnosis not present

## 2020-10-30 DIAGNOSIS — I10 Essential (primary) hypertension: Secondary | ICD-10-CM

## 2020-10-30 DIAGNOSIS — Q211 Atrial septal defect: Secondary | ICD-10-CM

## 2020-10-30 LAB — BASIC METABOLIC PANEL
Anion gap: 8 (ref 5–15)
BUN: 13 mg/dL (ref 6–20)
CO2: 27 mmol/L (ref 22–32)
Calcium: 9 mg/dL (ref 8.9–10.3)
Chloride: 101 mmol/L (ref 98–111)
Creatinine, Ser: 0.66 mg/dL (ref 0.44–1.00)
GFR, Estimated: >60 mL/min (ref >60)
Glucose, Bld: 248 mg/dL — ABNORMAL HIGH (ref 70–99)
Potassium: 3.8 mmol/L (ref 3.5–5.1)
Sodium: 136 mmol/L (ref 135–145)

## 2020-10-30 LAB — BRAIN NATRIURETIC PEPTIDE: B Natriuretic Peptide: 29.8 pg/mL (ref 0.0–100.0)

## 2020-10-30 MED ORDER — CARVEDILOL 6.25 MG PO TABS: 6.2500 mg | ORAL_TABLET | Freq: Two times a day (BID) | ORAL | 3 refills | Status: DC

## 2020-10-30 NOTE — Patient Instructions (Signed)
INCREASE Coreg to 6.25 mg, one tab twice a day  Labs today We will only contact you if something comes back abnormal or we need to make some changes. Otherwise no news is good news!  Your physician has requested that you regularly monitor and record your blood pressure readings at home. Please use the same machine at the same time of day to check your readings and record them to bring to your follow-up visit.  Your physician recommends that you schedule a follow-up appointment in: 4 months with DrBensimhon  Do the following things EVERYDAY: Weigh yourself in the morning before breakfast. Write it down and keep it in a log. Take your medicines as prescribed Eat low salt foods--Limit salt (sodium) to 2000 mg per day.  Stay as active as you can everyday Limit all fluids for the day to less than 2 liters   milAt [facility], you and your health needs are our priority. As part of our continuing mission to provide you with exceptional heart care, we have created designated Provider Care Teams. These Care Teams include your primary Cardiologist (physician) and Advanced Practice Providers (APPs- Physician Assistants and Nurse Practitioners) who all work together to provide you with the care you need, when you need it.   You may see any of the following providers on your designated Care Team at your next follow up: Dr Glori Bickers Dr Loralie Champagne Dr Patrice Paradise, NP Lyda Jester, [place] Ginnie Smart Audry Riles, PharmD   Please be sure to bring in all your medications bottles to every appointment.   If you have any questions or concerns before your next appointment please send [place] a message through [place] or call our office at [phone].    TO LEAVE A MESSAGE FOR THE NURSE SELECT OPTION 2, PLEASE LEAVE A MESSAGE INCLUDING: YOUR NAME DATE OF BIRTH CALL BACK NUMBER REASON FOR CALL**this is important as we prioritize the call backs  YOU  WILL RECEIVE A CALL BACK THE SAME DAY AS LONG AS YOU CALL BEFORE 4:00 PM

## 2020-11-04 ENCOUNTER — Other Ambulatory Visit: Payer: Self-pay | Admitting: Family Medicine

## 2020-11-06 ENCOUNTER — Other Ambulatory Visit: Payer: Self-pay | Admitting: Family Medicine

## 2020-11-21 ENCOUNTER — Other Ambulatory Visit: Payer: Self-pay | Admitting: Internal Medicine

## 2020-11-21 ENCOUNTER — Other Ambulatory Visit (HOSPITAL_COMMUNITY): Payer: Self-pay | Admitting: Internal Medicine

## 2020-11-24 ENCOUNTER — Other Ambulatory Visit: Payer: Self-pay

## 2020-11-24 ENCOUNTER — Ambulatory Visit (INDEPENDENT_AMBULATORY_CARE_PROVIDER_SITE_OTHER): Payer: No Typology Code available for payment source | Admitting: Family Medicine

## 2020-11-24 ENCOUNTER — Encounter: Payer: Self-pay | Admitting: Family Medicine

## 2020-11-24 VITALS — BP 148/80 | HR 67 | Temp 97.3°F | Ht 65.0 in | Wt 267.1 lb

## 2020-11-24 DIAGNOSIS — Z0000 Encounter for general adult medical examination without abnormal findings: Secondary | ICD-10-CM | POA: Diagnosis not present

## 2020-11-24 DIAGNOSIS — D509 Iron deficiency anemia, unspecified: Secondary | ICD-10-CM

## 2020-11-24 DIAGNOSIS — Z23 Encounter for immunization: Secondary | ICD-10-CM | POA: Diagnosis not present

## 2020-11-24 DIAGNOSIS — E890 Postprocedural hypothyroidism: Secondary | ICD-10-CM | POA: Diagnosis not present

## 2020-11-24 DIAGNOSIS — E1142 Type 2 diabetes mellitus with diabetic polyneuropathy: Secondary | ICD-10-CM

## 2020-11-24 DIAGNOSIS — E1165 Type 2 diabetes mellitus with hyperglycemia: Secondary | ICD-10-CM

## 2020-11-24 DIAGNOSIS — E785 Hyperlipidemia, unspecified: Secondary | ICD-10-CM

## 2020-11-24 DIAGNOSIS — E559 Vitamin D deficiency, unspecified: Secondary | ICD-10-CM

## 2020-11-24 DIAGNOSIS — Z79899 Other long term (current) drug therapy: Secondary | ICD-10-CM

## 2020-11-24 DIAGNOSIS — E1169 Type 2 diabetes mellitus with other specified complication: Secondary | ICD-10-CM

## 2020-11-24 DIAGNOSIS — I10 Essential (primary) hypertension: Secondary | ICD-10-CM | POA: Diagnosis not present

## 2020-11-24 DIAGNOSIS — F418 Other specified anxiety disorders: Secondary | ICD-10-CM

## 2020-11-24 DIAGNOSIS — Z1211 Encounter for screening for malignant neoplasm of colon: Secondary | ICD-10-CM

## 2020-11-24 DIAGNOSIS — IMO0002 Reserved for concepts with insufficient information to code with codable children: Secondary | ICD-10-CM

## 2020-11-24 NOTE — Patient Instructions (Addendum)
If you are interested in the shingles vaccine series (Shingrix), call your insurance or pharmacy to check on coverage and location it must be given.  If affordable - you can schedule it here or at your pharmacy depending on coverage   I placed a referral for colonoscpy -you will get a call   You will need a [facility] ([facility])   Call cardiology about the carvedilol  ? If you are having side effects and BP is still high   Lets get labs

## 2020-11-24 NOTE — Progress Notes (Signed)
Subjective:    Patient ID: [name], female    DOB: 06-24-1965, 55 y.o.   MRN: [id number]  This visit occurred during the SARS-CoV-2 public health emergency.  Safety protocols were in place, including screening questions prior to the visit, additional usage of staff PPE, and extensive cleaning of exam room while observing appropriate contact time as indicated for disinfecting solutions.   HPI Here for health maintenance exam and to review chronic medical problems    Wt Readings from Last 3 Encounters:  11/24/20 267 lb 1 oz (121.1 kg)  10/30/20 264 lb 3.2 oz (119.8 kg)  10/09/20 262 lb (118.8 kg)   44.44 kg/m  Going to the beach for a week this month  Excited   Has been feeling pretty tired lately  61 out  Has a lot going on in general/life  Had a cold last month and got better  Her nose runs all the time after covid and still no full sense of smell   Works at [employer] - going well   Nephew died - hyperglycemia Then dad had a heart attack and 4 stents Mother- nsg home/MS Son went to jail for 4 mo for DUI  She is a Research officer, trade union  Hard to find time to take care of herself   Brain fog after covid also   Zoster status - interested in covered  Pna vaccine 5/19 Covid immunized  Colonoscopy 6/17 with 5 y recall (due) Father had colon cancer  She does colonoscopy at [facility]   Flu shot-today  Mamm[facility] 11/21 -[facility] at [facility] Self breast exam -no lumps   11/21 pap neg with neg HPV  ([facility])   HTN bp is stable today  No cp or palpitations or headaches or edema  No side effects to medicines  BP Readings from Last 3 Encounters:  11/24/20 (!) 148/80  10/30/20 (!) 166/70  09/13/20 (!) 160/80    Pulse Readings from Last 3 Encounters:  11/24/20 67  10/30/20 71  09/13/20 64  Aldactone 25 mg daily Coreg 6.25 mg bid  OSA-using cpap   DM2 Sees endocrinology  Blood sugar is labile Uses libre monitor , today 255, 245 , 293   There are days when it is lower (mid to high 100s) No exercise , too tired    Hypothyroidism -also sees endocrinology  Pt has no clinical changes No change in energy level/ hair or skin/ edema and no tremor Levothyroxine 175 mcg daily  No missed doses   Hyperlipidemia Lab Results  Component Value Date   CHOL 241 (H) 09/29/2019   HDL 40.50 09/29/2019   LDLCALC 71 06/16/2015   LDLDIRECT 140.0 09/29/2019   TRIG (H) 09/29/2019    557.0 Triglyceride is over 400; calculations on Lipids are invalid.   CHOLHDL 6 09/29/2019   Taking simvastatin 20 mg daily   Vit D def Labs today  Depression and anxiety  Zoloft 150 mg daily  Wellbutrin xl 150 mg daily  Lots of stress  Hard to concentrate-brain fog     Iron def anemia  Iron bid  Lab Results  Component Value Date   WBC 8.0 09/29/2019   HGB 13.8 09/29/2019   HCT 41.9 09/29/2019   MCV 90.5 09/29/2019   PLT 229.0 09/29/2019   Patient Active Problem List   Diagnosis Date Noted   Current use of proton pump inhibitor 11/24/2020   Colon cancer screening 11/24/2020   Vitamin D deficiency 09/27/2019  Lumbar radiculopathy 12/16/2018   Diabetes, polyneuropathy ([place]) 12/16/2018   Bilateral carpal tunnel syndrome 11/24/2018   Family history of MS (multiple sclerosis) 09/29/2018   Routine general medical examination at a health care facility 12/19/2013   Palpitations 02/03/2013   Postsurgical hypothyroidism 04/28/2012   History of thyroid cancer 04/27/2012   Cushing's syndrome ([place]) 04/27/2012   Chronic diastolic heart failure ([place]) 123XX123   Diastolic CHF ([place]) XX123456   OSA (obstructive sleep apnea) just got cpap at home this past week 01/17/12 01/17/2012   Chronic allergic rhinitis 10/26/2010   Insomnia 10/26/2010   OBESITY, MORBID 04/27/2010   HEMORRHOIDS, INTERNAL, WITH BLEEDING 08/17/2009   GERD 04/27/2008   Essential hypertension 11/03/2007   Hyperlipidemia associated with type 2 diabetes mellitus ([place])  05/14/2007   OTHER ORGANIC SLEEP DISORDERS 03/09/2007   Uncontrolled type 2 diabetes mellitus with peripheral neuropathy ([place]) 09/11/2006   POLYCYSTIC OVARIES 09/11/2006   Iron deficiency anemia 09/11/2006   Depression with anxiety 09/11/2006   EXTERNAL HEMORRHOIDS 09/11/2006   Asthma 09/11/2006   Past Medical History:  Diagnosis Date   Allergy    SEASONAL   Anemia    iron deficiency   Anxiety    Arthritis    Asthma    a. 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   Cellulitis and abscess of leg 02/2014   CHF (congestive heart failure) ([place])    Chronic kidney disease    [id number]   Complication of anesthesia    DIFFICULT WAKING- 2011   Depression    a. Stress reaction 08/2011 in multiple social stressors.   Diabetes mellitus    Dysrhythmia    Gastritis    GERD (gastroesophageal reflux disease)    Heart murmur    Hemorrhoid    Hyperlipidemia    Hypertension    Hyperthyroidism    Insomnia    Morbid obesity (HCC)    OSA (obstructive sleep apnea) 01/17/2012   Papillary thyroid carcinoma (HCC)    PCOS (polycystic ovarian syndrome)    Sleep apnea    C PAP   Thyroid cancer ([place]) 2014   Vertigo    Past Surgical History:  Procedure Laterality Date   CHOLECYSTECTOMY     HEMORRHOID SURGERY     INTRAUTERINE DEVICE INSERTION  2011   SHOULDER SURGERY  06/2009   THYROIDECTOMY  04/10/2012   Procedure: THYROIDECTOMY;  Surgeon: Jerrell Belfast, MD;  Location: [facility] OR;  Service: ENT;  Laterality: N/A;  Total Thyroidectomy   TONSILLECTOMY AND ADENOIDECTOMY  CHILD   TUBAL LIGATION     Social History   Tobacco Use   Smoking status: Former    Packs/day: 0.20    Years: 5.00    Pack years: 1.00    Types: Cigarettes    Quit date: 03/25/2004    Years since quitting: 16.6   Smokeless tobacco: Never  Substance Use Topics   Alcohol use: No    Alcohol/week: 0.0 standard drinks   Drug use: No   Family History  Problem Relation Age of Onset   Cancer  Father        colon cancer   Arthritis Father        osteoarthritis   Colon cancer Father    Colon polyps Father    Transient ischemic attack Father    COPD Father    Arthritis Mother        osteoarthritis   Asthma Mother    Hypertension Mother    Multiple  sclerosis Mother    Ovarian cancer Maternal Aunt    Diabetes Paternal Aunt    Diabetes Paternal Uncle    Heart disease Maternal Grandfather    Allergies  Allergen Reactions   Tape Rash    Plastic tape   Buspirone Hcl Nausea Only   Glipizide     REACTION: sleepy, low sugar   Ivp Dye [Iodinated Diagnostic Agents]     Shut kidneys down   Oxycodone-Acetaminophen Itching   Current Outpatient Medications on File Prior to Visit  Medication Sig Dispense Refill   Albuterol Sulfate (PROAIR RESPICLICK) 123XX123 (90 Base) MCG/ACT AEPB Inhale 2 puffs into the lungs every 6 (six) hours as needed for up to 6 doses. 1 each 1   buPROPion (WELLBUTRIN XL) 150 MG 24 hr tablet TAKE 1 TABLET BY MOUTH EVERY DAY 90 tablet 0   carvedilol (COREG) 6.25 MG tablet Take 1 tablet (6.25 mg total) by mouth 2 (two) times daily with a meal. 60 tablet 3   cetirizine (ZYRTEC) 10 MG tablet TAKE 1 TABLET BY MOUTH EVERY DAY 90 tablet 0   Continuous Blood Gluc Sensor (FREESTYLE LIBRE 14 DAY SENSOR) MISC APPLY 1 SENSOR BY SUBDERMAL ROUTE EVERY 14 DAYS. 2 each 11   empagliflozin (JARDIANCE) 25 MG TABS tablet Take 1 tablet (25 mg total) by mouth daily before breakfast. 90 tablet 3   ferrous sulfate 325 (65 FE) MG tablet Take 325 mg by mouth 2 (two) times daily.       fluticasone (FLONASE) 50 MCG/ACT nasal spray Place 2 sprays into both nostrils daily as needed for allergies. 16 g 11   glucose blood (ONE TOUCH ULTRA TEST) test strip Use 2x a day 100 each 2   Insulin Pen Needle (B-D ULTRAFINE III SHORT PEN) 31G X 8 MM MISC USE ONCE DAILY 100 each 3   Insulin Pen Needle (BD PEN NEEDLE NANO 2ND GEN) 32G X 4 MM MISC Use once daily to check blood sugar 100 each 3   Insulin Pen  Needle 32G X 4 MM MISC Use 4x a day 300 each 3   levothyroxine (SYNTHROID) 175 MCG tablet TAKE 1 TABLET BY MOUTH EVERY DAY BEFORE BREAKFAST 30 tablet 0   metFORMIN (GLUCOPHAGE) 1000 MG tablet TAKE 1 TABLET BY MOUTH TWICE A DAY WITH MEALS 60 tablet 0   pantoprazole (PROTONIX) 40 MG tablet TAKE 1 TABLET BY MOUTH EVERY DAY 90 tablet 0   potassium chloride (KLOR-CON) 20 MEQ packet Take 20 mEq by mouth as needed.     sertraline (ZOLOFT) 100 MG tablet TAKE 1 AND 1/2 TABLETS BY MOUTH EVERY DAY 135 tablet 0   simvastatin (ZOCOR) 20 MG tablet TAKE 1 TABLET BY MOUTH EVERY DAY IN THE EVENING 90 tablet 0   spironolactone (ALDACTONE) 25 MG tablet Take 1 tablet (25 mg total) by mouth daily. 90 tablet 3   torsemide (DEMADEX) 20 MG tablet Take 1 tablet (20 mg total) by mouth daily as needed. 30 tablet 6   TOUJEO SOLOSTAR 300 UNIT/ML Solostar Pen INJECT 90 UNITS INTO THE SKIN AT BEDTIME. 15 mL 0   TRAVATAN Z 0.004 % SOLN ophthalmic solution Place 1 drop into both eyes at bedtime.      Vitamin D, Ergocalciferol, (DRISDOL) 1.25 MG (50000 UNIT) CAPS capsule TAKE 1 CAPSULE BY MOUTH EVERY 7 DAYS 13 capsule 4   zolpidem (AMBIEN) 10 MG tablet TAKE 1/2 TO 1 TABLET BY MOUTH AT BEDTIME AS NEEDED 30 tablet 3   No current facility-administered medications  on file prior to visit.     Review of Systems  Constitutional:  Positive for fatigue. Negative for activity change, appetite change, fever and unexpected weight change.  HENT:  Negative for congestion, ear pain, rhinorrhea, sinus pressure and sore throat.   Eyes:  Negative for pain, redness and visual disturbance.  Respiratory:  Negative for cough, shortness of breath and wheezing.   Cardiovascular:  Negative for chest pain and palpitations.  Gastrointestinal:  Negative for abdominal pain, blood in stool, constipation and diarrhea.  Endocrine: Negative for polydipsia and polyuria.  Genitourinary:  Negative for dysuria, frequency and urgency.  Musculoskeletal:   Negative for arthralgias, back pain and myalgias.  Skin:  Negative for pallor and rash.  Allergic/Immunologic: Negative for environmental allergies.  Neurological:  Negative for dizziness, syncope and headaches.  Hematological:  Negative for adenopathy. Does not bruise/bleed easily.  Psychiatric/Behavioral:  Positive for dysphoric mood. Negative for decreased concentration. The patient is not nervous/anxious.       Objective:   Physical Exam Constitutional:      General: She is not in acute distress.    Appearance: Normal appearance. She is well-developed. She is obese. She is not ill-appearing or diaphoretic.  HENT:     Head: Normocephalic and atraumatic.     Right Ear: Tympanic membrane, ear canal and external ear normal.     Left Ear: Tympanic membrane, ear canal and external ear normal.     Nose: Nose normal. No congestion.     Mouth/Throat:     Mouth: Mucous membranes are moist.     Pharynx: Oropharynx is clear. No posterior oropharyngeal erythema.  Eyes:     General: No scleral icterus.    Extraocular Movements: Extraocular movements intact.     Conjunctiva/sclera: Conjunctivae normal.     Pupils: Pupils are equal, round, and reactive to light.  Neck:     Thyroid: No thyromegaly.     Vascular: No carotid bruit or JVD.  Cardiovascular:     Rate and Rhythm: Normal rate and regular rhythm.     Pulses: Normal pulses.     Heart sounds: Normal heart sounds.    No gallop.  Pulmonary:     Effort: Pulmonary effort is normal. No respiratory distress.     Breath sounds: Normal breath sounds. No wheezing.     Comments: Good air exch Chest:     Chest wall: No tenderness.  Abdominal:     General: Bowel sounds are normal. There is no distension or abdominal bruit.     Palpations: Abdomen is soft. There is no mass.     Tenderness: There is no abdominal tenderness.     Hernia: No hernia is present.  Genitourinary:    Comments: Breast and gyn exam are done by gyn  provider Musculoskeletal:        General: No tenderness. Normal range of motion.     Cervical back: Normal range of motion and neck supple. No rigidity. No muscular tenderness.     Right lower leg: No edema.     Left lower leg: No edema.     Comments: No kyphosis   Lymphadenopathy:     Cervical: No cervical adenopathy.  Skin:    General: Skin is warm and dry.     Coloration: Skin is not pale.     Findings: No erythema or rash.     Comments: Solar lentigines diffusely   Neurological:     Mental Status: She is alert. Mental status is at  baseline.     Cranial Nerves: No cranial nerve deficit.     Motor: No abnormal muscle tone.     Coordination: Coordination normal.     Gait: Gait normal.     Deep Tendon Reflexes: Reflexes are normal and symmetric. Reflexes normal.  Psychiatric:        Mood and Affect: Mood normal.        Cognition and Memory: Cognition and memory normal.          Assessment & Plan:   Problem List Items Addressed This Visit       Cardiovascular and Mediastinum   Essential hypertension    bp in fair control at this time  BP Readings from Last 1 Encounters:  11/24/20 (!) 148/80  Taking aldactone 25 mg daily  Also carvedilol 6.25 mg bid and has noted fatigue since starting it  Plans to d/w her cardiologist bp is not quite at boal Most recent labs reviewed  Disc lifstyle change with low sodium diet and exercise        Relevant Orders   CBC with Differential/Platelet (Completed)   Comprehensive metabolic panel (Completed)   Lipid panel (Completed)   TSH (Completed)     Endocrine   Uncontrolled type 2 diabetes mellitus with peripheral neuropathy ([place]) (Chronic)    Pt sees endocrinology and notes that glucose readings tend to be labile  Too tired to exercise (? Possibly due to new carvedilol)  Taking a statin      Hyperlipidemia associated with type 2 diabetes mellitus ([place])    Disc goals for lipids and reasons to control them Rev last labs with  pt and lipid panel ordered today Rev low sat fat diet in detail Taking simvastatin 20 mg daily       Relevant Orders   Lipid panel (Completed)   Postsurgical hypothyroidism    Sees endocrinology  Taking levothyroxine daily and has been much more tired  No missed doses 175 mcg       Relevant Orders   TSH (Completed)   Diabetes, polyneuropathy (HCC)    No clinical changes        Other   Iron deficiency anemia    Cbc with iron level today  Taking iron bid      Relevant Orders   CBC with Differential/Platelet (Completed)   Iron (Completed)   Depression with anxiety    Multiple new stressors but overall doing ok Reviewed stressors/ coping techniques/symptoms/ support sources/ tx options and side effects in detail today Plan to continue  zoloft 150 mg daily wellbutrin xl 150 mg daily       OBESITY, MORBID    Discussed how this problem influences overall health and the risks it imposes  Reviewed plan for weight loss with lower calorie diet (via better food choices and also portion control or program like weight watchers) and exercise building up to or more than 30 minutes 5 days per week including some aerobic activity  Very tired and working out some medication issues before she feels like exercise        Routine general medical examination at a health care facility - Primary    Reviewed health habits including diet and exercise and skin cancer prevention Reviewed appropriate screening tests for age  Also reviewed health mt list, fam hx and immunization status , as well as social and family history   See HPI Labs reviewed  Many stressors but getting by Interested in shingrix iv covered  covid immunized  Flu shot given today Due for 5 y recall colonoscopy , referral done  Planning gyn visit with mammogram in November        Vitamin D deficiency    Labs ordered today Disc imp to bone and overall health      Relevant Orders   VITAMIN D 25 Hydroxy (Vit-D  Deficiency, Fractures) (Completed)   Current use of proton pump inhibitor    Disc its effects on absorption  B12 and D levels added to labs      Relevant Orders   Vitamin B12 (Completed)   Colon cancer screening    Due for 5 y colonoscopy for family h/o colon cancer Referral to GI done      Relevant Orders   Ambulatory referral to Gastroenterology   Other Visit Diagnoses     Need for influenza vaccination       Relevant Orders   Flu Vaccine QUAD 6+ mos PF IM (Fluarix Quad PF) (Completed)

## 2020-11-25 LAB — LIPID PANEL
Cholesterol: 215 mg/dL — ABNORMAL HIGH (ref <200)
HDL: 46 mg/dL — ABNORMAL LOW (ref >=50)
LDL Cholesterol (Calc): 127 mg/dL (calc) — ABNORMAL HIGH
Non-HDL Cholesterol (Calc): 169 mg/dL (calc) — ABNORMAL HIGH (ref <130)
Total CHOL/HDL Ratio: 4.7 (calc) (ref <5.0)
Triglycerides: 274 mg/dL — ABNORMAL HIGH (ref <150)

## 2020-11-25 LAB — IRON: Iron: 77 ug/dL (ref 45–160)

## 2020-11-25 LAB — COMPREHENSIVE METABOLIC PANEL
AG Ratio: 1.9 (calc) (ref 1.0–2.5)
ALT: 28 U/L (ref 6–29)
AST: 27 U/L (ref 10–35)
Albumin: 4.6 g/dL (ref 3.6–5.1)
Alkaline phosphatase (APISO): 90 U/L (ref 37–153)
BUN: 11 mg/dL (ref 7–25)
CO2: 30 mmol/L (ref 20–32)
Calcium: 9.5 mg/dL (ref 8.6–10.4)
Chloride: 100 mmol/L (ref 98–110)
Creat: 0.7 mg/dL (ref 0.50–1.03)
Globulin: 2.4 g/dL (calc) (ref 1.9–3.7)
Glucose, Bld: 182 mg/dL — ABNORMAL HIGH (ref 65–99)
Potassium: 3.8 mmol/L (ref 3.5–5.3)
Sodium: 141 mmol/L (ref 135–146)
Total Bilirubin: 0.4 mg/dL (ref 0.2–1.2)
Total Protein: 7 g/dL (ref 6.1–8.1)

## 2020-11-25 LAB — CBC WITH DIFFERENTIAL/PLATELET
Absolute Monocytes: 428 cells/uL (ref 200–950)
Basophils Absolute: 60 cells/uL (ref 0–200)
Basophils Relative: 0.8 %
Eosinophils Absolute: 188 cells/uL (ref 15–500)
Eosinophils Relative: 2.5 %
HCT: 44.9 % (ref 35.0–45.0)
Hemoglobin: 14.7 g/dL (ref 11.7–15.5)
Lymphs Abs: 1658 cells/uL (ref 850–3900)
MCH: 28.8 pg (ref 27.0–33.0)
MCHC: 32.7 g/dL (ref 32.0–36.0)
MCV: 87.9 fL (ref 80.0–100.0)
MPV: 9.9 fL (ref 7.5–12.5)
Monocytes Relative: 5.7 %
Neutro Abs: 5168 cells/uL (ref 1500–7800)
Neutrophils Relative %: 68.9 %
Platelets: 217 10*3/uL (ref 140–400)
RBC: 5.11 10*6/uL — ABNORMAL HIGH (ref 3.80–5.10)
RDW: 14.4 % (ref 11.0–15.0)
Total Lymphocyte: 22.1 %
WBC: 7.5 10*3/uL (ref 3.8–10.8)

## 2020-11-25 LAB — VITAMIN D 25 HYDROXY (VIT D DEFICIENCY, FRACTURES): Vit D, 25-Hydroxy: 53 ng/mL (ref 30–100)

## 2020-11-25 LAB — VITAMIN B12: Vitamin B-12: 341 pg/mL (ref 200–1100)

## 2020-11-25 LAB — TSH: TSH: 4.49 mIU/L

## 2020-11-26 NOTE — Assessment & Plan Note (Signed)
Labs ordered today Disc imp to bone and overall health

## 2020-11-26 NOTE — Assessment & Plan Note (Signed)
Cbc with iron level today  Taking iron bid

## 2020-11-26 NOTE — Assessment & Plan Note (Signed)
Reviewed health habits including diet and exercise and skin cancer prevention Reviewed appropriate screening tests for age  Also reviewed health mt list, fam hx and immunization status , as well as social and family history   See HPI Labs reviewed  Many stressors but getting by Interested in shingrix iv covered  covid immunized  Flu shot given today Due for 5 y recall colonoscopy , referral done  Planning gyn visit with mammogram in November

## 2020-11-26 NOTE — Assessment & Plan Note (Signed)
Due for 5 y colonoscopy for family h/o colon cancer Referral to GI done

## 2020-11-26 NOTE — Assessment & Plan Note (Signed)
Discussed how this problem influences overall health and the risks it imposes  Reviewed plan for weight loss with lower calorie diet (via better food choices and also portion control or program like weight watchers) and exercise building up to or more than 30 minutes 5 days per week including some aerobic activity  Very tired and working out some medication issues before she feels like exercise

## 2020-11-26 NOTE — Assessment & Plan Note (Signed)
bp in fair control at this time  BP Readings from Last 1 Encounters:  11/24/20 (!) 148/80   Taking aldactone 25 mg daily  Also carvedilol 6.25 mg bid and has noted fatigue since starting it  Plans to d/w her cardiologist bp is not quite at boal Most recent labs reviewed  Disc lifstyle change with low sodium diet and exercise

## 2020-11-26 NOTE — Assessment & Plan Note (Signed)
Multiple new stressors but overall doing ok Reviewed stressors/ coping techniques/symptoms/ support sources/ tx options and side effects in detail today Plan to continue  zoloft 150 mg daily wellbutrin xl 150 mg daily

## 2020-11-26 NOTE — Assessment & Plan Note (Signed)
Disc goals for lipids and reasons to control them Rev last labs with pt and lipid panel ordered today Rev low sat fat diet in detail Taking simvastatin 20 mg daily

## 2020-11-26 NOTE — Assessment & Plan Note (Signed)
Disc its effects on absorption  B12 and D levels added to labs

## 2020-11-26 NOTE — Assessment & Plan Note (Signed)
No clinical changes 

## 2020-11-26 NOTE — Assessment & Plan Note (Signed)
Pt sees endocrinology and notes that glucose readings tend to be labile  Too tired to exercise (? Possibly due to new carvedilol)  Taking a statin

## 2020-11-26 NOTE — Assessment & Plan Note (Signed)
Sees endocrinology  Taking levothyroxine daily and has been much more tired  No missed doses 175 mcg

## 2020-11-27 ENCOUNTER — Other Ambulatory Visit: Payer: Self-pay | Admitting: Internal Medicine

## 2020-11-30 ENCOUNTER — Telehealth: Payer: Self-pay | Admitting: Family Medicine

## 2020-11-30 MED ORDER — ROSUVASTATIN CALCIUM 10 MG PO TABS: 10.0000 mg | ORAL_TABLET | Freq: Every day | ORAL | 3 refills | Status: DC

## 2020-11-30 NOTE — Telephone Encounter (Signed)
Px sent  I would like to get lipid panel 6-8 wk after starting it  Thanks

## 2020-11-30 NOTE — Telephone Encounter (Signed)
-----   Message from Tammi Sou, [place] sent at 11/30/2020  1:00 PM EDT ----- Pt viewed results via mychart. Pt also advised of Dr. Marliss Coots comments. Pt is okay switching cholesterol meds. She is going out of town and would like to start it when she gets back incase of any side eff. I advised pt that was fine and she can pick up Rx once she returns. Please send new Rx to [facility] [address]

## 2020-12-07 NOTE — Telephone Encounter (Signed)
See prev note from PCP. Please schedule fasting lab appt to recheck cholesterol. Can schedule at [facility] if easier

## 2020-12-11 ENCOUNTER — Encounter: Payer: Self-pay | Admitting: Family Medicine

## 2020-12-11 NOTE — Telephone Encounter (Signed)
LMTCB to schedule lab

## 2020-12-17 ENCOUNTER — Other Ambulatory Visit: Payer: Self-pay | Admitting: Internal Medicine

## 2021-01-08 ENCOUNTER — Other Ambulatory Visit: Payer: Self-pay | Admitting: Family Medicine

## 2021-01-08 MED ORDER — PANTOPRAZOLE SODIUM 40 MG PO TBEC: 40.0000 mg | DELAYED_RELEASE_TABLET | Freq: Every day | ORAL | 2 refills | Status: DC

## 2021-01-08 NOTE — Telephone Encounter (Signed)
Name of Medication: Lorrin Mais Name of Pharmacy: [facility] [address] [place] or Written Date and Quantity: 09/06/20 #30 tabs/ 3 refills Last Office Visit and Type: CPE on 11/24/20 Next Office Visit and Type: none scheduled  :

## 2021-01-21 ENCOUNTER — Other Ambulatory Visit: Payer: Self-pay | Admitting: Internal Medicine

## 2021-01-26 ENCOUNTER — Other Ambulatory Visit: Payer: Self-pay | Admitting: Internal Medicine

## 2021-01-26 ENCOUNTER — Other Ambulatory Visit (HOSPITAL_COMMUNITY): Payer: Self-pay | Admitting: Family Medicine

## 2021-01-26 ENCOUNTER — Other Ambulatory Visit: Payer: Self-pay | Admitting: Family Medicine

## 2021-01-26 ENCOUNTER — Other Ambulatory Visit: Payer: Self-pay | Admitting: Neurology

## 2021-02-12 ENCOUNTER — Other Ambulatory Visit: Payer: Self-pay | Admitting: Internal Medicine

## 2021-02-12 ENCOUNTER — Other Ambulatory Visit: Payer: Self-pay | Admitting: Family Medicine

## 2021-03-05 ENCOUNTER — Encounter (HOSPITAL_COMMUNITY): Payer: No Typology Code available for payment source | Admitting: Internal Medicine

## 2021-03-12 ENCOUNTER — Other Ambulatory Visit: Payer: Self-pay | Admitting: Family Medicine

## 2021-03-12 ENCOUNTER — Other Ambulatory Visit: Payer: Self-pay | Admitting: Neurology

## 2021-03-12 ENCOUNTER — Other Ambulatory Visit: Payer: Self-pay | Admitting: Internal Medicine

## 2021-04-08 ENCOUNTER — Other Ambulatory Visit: Payer: Self-pay | Admitting: Neurology

## 2021-04-08 ENCOUNTER — Other Ambulatory Visit: Payer: Self-pay | Admitting: Internal Medicine

## 2021-04-08 ENCOUNTER — Other Ambulatory Visit: Payer: Self-pay | Admitting: Family Medicine

## 2021-04-12 ENCOUNTER — Other Ambulatory Visit: Payer: Self-pay | Admitting: Internal Medicine

## 2021-04-13 ENCOUNTER — Other Ambulatory Visit: Payer: Self-pay | Admitting: Internal Medicine

## 2021-04-17 ENCOUNTER — Telehealth: Payer: Self-pay

## 2021-04-17 MED ORDER — TOUJEO SOLOSTAR 300 UNIT/ML ~~LOC~~ SOPN: 90.0000 [IU] | PEN_INJECTOR | Freq: Every day | SUBCUTANEOUS | 0 refills | Status: DC

## 2021-04-17 NOTE — Telephone Encounter (Signed)
Called and left a detailed message for pt to call back to verify she can log in for a virtual visit and we have scheduled her for 04/20/21 at 3pm. Mychart message sent as well.

## 2021-04-19 ENCOUNTER — Telehealth: Payer: No Typology Code available for payment source | Admitting: Internal Medicine

## 2021-04-20 ENCOUNTER — Telehealth: Payer: No Typology Code available for payment source | Admitting: Internal Medicine

## 2021-04-20 ENCOUNTER — Other Ambulatory Visit: Payer: Self-pay

## 2021-04-20 ENCOUNTER — Encounter: Payer: Self-pay | Admitting: Internal Medicine

## 2021-04-20 DIAGNOSIS — E890 Postprocedural hypothyroidism: Secondary | ICD-10-CM | POA: Diagnosis not present

## 2021-04-20 DIAGNOSIS — E1142 Type 2 diabetes mellitus with diabetic polyneuropathy: Secondary | ICD-10-CM

## 2021-04-20 DIAGNOSIS — E1165 Type 2 diabetes mellitus with hyperglycemia: Secondary | ICD-10-CM

## 2021-04-20 DIAGNOSIS — C73 Malignant neoplasm of thyroid gland: Secondary | ICD-10-CM

## 2021-04-20 DIAGNOSIS — Z91199 Patient's noncompliance with other medical treatment and regimen due to unspecified reason: Secondary | ICD-10-CM

## 2021-04-20 MED ORDER — METFORMIN HCL 1000 MG PO TABS: 1000.0000 mg | ORAL_TABLET | Freq: Two times a day (BID) | ORAL | 0 refills | Status: DC

## 2021-04-20 MED ORDER — LEVOTHYROXINE SODIUM 175 MCG PO TABS: ORAL_TABLET | ORAL | 3 refills | Status: DC

## 2021-04-20 MED ORDER — HUMALOG KWIKPEN 200 UNIT/ML ~~LOC~~ SOPN: 10.0000 [IU] | PEN_INJECTOR | Freq: Three times a day (TID) | SUBCUTANEOUS | 0 refills | Status: DC

## 2021-04-20 MED ORDER — TOUJEO SOLOSTAR 300 UNIT/ML ~~LOC~~ SOPN: 90.0000 [IU] | PEN_INJECTOR | Freq: Every day | SUBCUTANEOUS | 0 refills | Status: DC

## 2021-04-20 MED ORDER — EMPAGLIFLOZIN 25 MG PO TABS: 25.0000 mg | ORAL_TABLET | Freq: Every day | ORAL | 0 refills | Status: DC

## 2021-04-20 MED ORDER — FREESTYLE LIBRE 3 SENSOR MISC: 1.0000 | 0 refills | Status: DC

## 2021-04-20 NOTE — Patient Instructions (Addendum)
Please restart: - Metformin 1000 mg 2x a day - Toujeo 45 x 2 injections at night - Humalog 15-20 units 3x a day 15 min before meals You may need 5-10 units of Humalog around 8 am even if you do not drink coffee or eat b'fast.  Continue: - Jardiance 25 mg before b'fast  Please restart:  - Levothyroxine 175 mcg daily.  Take the thyroid hormone every day with water, at least 30 minutes before breakfast, separated by at least 4 hours from: - acid reflux medications - calcium - iron - multivitamins  Please return in 1.5 months.

## 2021-04-20 NOTE — Progress Notes (Signed)
Patient ID: [name], female   DOB: [birth year], 56 y.o.   MRN: [id number]  Patient location: Workplace My location: Office Persons participating in the virtual visit: patient, provider  Referring Provider: Glori Hodges, Monica Fanny, MD  I connected with the patient on 04/20/21 at  2:51 PM EST by a phone application and verified that I am speaking with the correct person.   I discussed the limitations of evaluation and management by telemedicine and the availability of in person appointments. The patient expressed understanding and agreed to proceed.   Details of the encounter are shown below.  HPI: [name] is a 56 y.o.-year-old female, returning for f/u for DM2, dx 2008, insulin-dependent since ~2011, uncontrolled, with complications (peripheral neuropathy, dCHF), h/o papillary thyroid cancer and iatrogenic hypothyroidism.  Last visit 1 year and 3 months ago.  Interim history: She is worried about her husband who had a liver transplant and now has prostate cancer. She has not been taking care of herself-did not present for visit or take her diabetic medications lately.  DM2: Reviewed HbA1c levels: Lab Results  Component Value Date   HGBA1C 10.8 (A) 12/09/2019   HGBA1C 10.2 (A) 11/27/2018   HGBA1C 9.6 (H) 08/14/2018    She is on: - Toujeo 45 x 2 injections at night >> off now since 02/2021 - Metformin 1000 mg 2x a day  >> off now since 02/2021 - Invokana 300 mg in a.m. >> Jardiance 25 mg before b'fast - Humalog 12 to 15 units before meals-started 12/09/2019 >> stopped 2/2 nausea, diarrhea (?) - - started 08/2017 >> restarted >> stopped 2/2 nausea in 2020 She was also on Glipizide but developed hypoglycemia >> loss of consciousness. She was also on Amaryl >> taken off.   She checks sugars >4x a day with her Libre CGM: - am: 170-220 >> 140-160 >> 132-181 >> 175-269 >> 300-400 - before lunch: 116-163 >> n/c >> 115 >> 310-380 >> n/c - before dinner: 113 >> n/c >> 106, 156  >>  214-349 >> 400s-500 - bedtime: 155-178, 211 >> n/c >> 160s >> 153, 159 >> n/c     Previously:    Pt's meals are: - Breakfast: skips - 10 o'clock: banana, apple, nectarine - Lunch: fast food - Dinner: goes out to eat around 8 o'clock: chicken sandwich or a wrap - Snacks: 0, no concentrated sweets  -No CKD, last BUN/creatinine:  Lab Results  Component Value Date   BUN 11 11/24/2020   CREATININE 0.70 11/24/2020  Not on ACE inhibitor/ARB  -+ HL; last set of lipids: Lab Results  Component Value Date   CHOL 215 (H) 11/24/2020   HDL 46 (L) 11/24/2020   LDLCALC 127 (H) 11/24/2020   LDLDIRECT 140.0 09/29/2019   TRIG 274 (H) 11/24/2020   CHOLHDL 4.7 11/24/2020  On simvastatin.  - last eye exam was in 02/2020: No DR reportedly, + glaucoma. [facility].  - she has Numbness and tingling in her feet.  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  Not have to have RAI treatment  Neck U/S (11/24/2014): Post thyroidectomy.  No suspicious lymphadenopathy or soft tissue.  Neck U/S (10/07/2017): Post total thyroidectomy without evidence residual, locally recurrent or metastatic disease.  Pt is on levothyroxine 175 mcg daily, taken: - now off for 1.5 mo! - after getting to work,  still missing doses during the weekend - in am - fasting - at least 30 min from b'fast - no calcium - + iron at lunchtime and at night - no multivitamins - + PPIs at lunchtime - off Biotin  She is not compliant with doses.  Her TFTs are fluctuating. Lab Results  Component Value Date   TSH 4.49 11/24/2020   TSH 23.95 (H) 09/29/2019   TSH 20.95 (H) 08/14/2018   TSH 2.011 12/15/2017   TSH 6.32 (H) 08/01/2017   TSH 2.83 05/23/2017   TSH 14.73 (H) 07/26/2016   TSH 9.17 (A) 04/02/2016   TSH 11.83 (H)  06/16/2015   TSH 1.82 11/03/2014   Pt denies: - feeling nodules in neck - hoarseness - choking - SOB with lying down But has chronic dysphagia  She is on vitamin D supplements.  ROS: + see HPI Musculoskeletal: + muscle aches/+ joint aches Neurological: no tremors/+ numbness/+ tingling/no dizziness  I reviewed pt's medications, allergies, PMH, social hx, family hx, and changes were documented in the history of present illness. Otherwise, unchanged from my initial visit note.  Past Medical History:  Diagnosis Date   Allergy    SEASONAL   Anemia    iron deficiency   Anxiety    Arthritis    Asthma    a. 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   Cellulitis and abscess of leg 02/2014   CHF (congestive heart failure) ([place])    Chronic kidney disease    /12   Complication of anesthesia    DIFFICULT WAKING- 2011   Depression    a. Stress reaction 08/2011 in multiple social stressors.   Diabetes mellitus    Dysrhythmia    Gastritis    GERD (gastroesophageal reflux disease)    Heart murmur    Hemorrhoid    Hyperlipidemia    Hypertension    Hyperthyroidism    Insomnia    Morbid obesity (HCC)    OSA (obstructive sleep apnea) 01/17/2012   Papillary thyroid carcinoma (HCC)    PCOS (polycystic ovarian syndrome)    Sleep apnea    C PAP   Thyroid cancer ([place]) 2014   Vertigo    Past Surgical History:  Procedure Laterality Date   CHOLECYSTECTOMY     HEMORRHOID SURGERY     INTRAUTERINE DEVICE INSERTION  2011   SHOULDER SURGERY  06/2009   THYROIDECTOMY  04/10/2012   Procedure: THYROIDECTOMY;  Surgeon: Monica Belfast, MD;  Location: [facility];  Service: ENT;  Laterality: N/A;  Total Thyroidectomy   TONSILLECTOMY AND ADENOIDECTOMY  CHILD   TUBAL LIGATION     Social History   Socioeconomic History   Marital status: Married    Spouse name: [name]   Number of children: 2   Years of education: Some college   Highest education level:  Not on file  Occupational History   Occupation: [employer] cabin [employer]  Tobacco Use   Smoking status: Former    Packs/day: 0.20    Years: 5.00    Pack years: 1.00    Types: Cigarettes    Quit date: 03/25/2004    Years since quitting: 17.0   Smokeless tobacco: Never  Substance and Sexual Activity   Alcohol use: No    Alcohol/week: 0.0 standard drinks   Drug use: No   Sexual activity: Yes  Other Topics Concern   Not on file  Social History Narrative   Lives with son/husband   Caffeine use:  daily   Right handed    Social Determinants of Health   Financial Resource Strain: Not on file  Food Insecurity: Not on file  Transportation Needs: Not on file  Physical Activity: Not on file  Stress: Not on file  Social Connections: Not on file  Intimate Partner Violence: Not on file   Current Outpatient Medications on File Prior to Visit  Medication Sig Dispense Refill   Albuterol Sulfate (PROAIR RESPICLICK) 098 (90 Base) MCG/ACT AEPB Inhale 2 puffs into the lungs every 6 (six) hours as needed for up to 6 doses. 1 each 1   buPROPion (WELLBUTRIN XL) 150 MG 24 hr tablet TAKE 1 TABLET BY MOUTH EVERY DAY 90 tablet 2   carvedilol (COREG) 6.25 MG tablet TAKE 1 TABLET BY MOUTH 2 TIMES DAILY WITH A MEAL. 180 tablet 3   cetirizine (ZYRTEC) 10 MG tablet TAKE 1 TABLET BY MOUTH EVERY DAY 90 tablet 2   Continuous Blood Gluc Sensor (FREESTYLE LIBRE 14 DAY SENSOR) MISC APPLY 1 SENSOR BY SUBDERMAL ROUTE EVERY 14 DAYS. 2 each 11   empagliflozin (JARDIANCE) 25 MG TABS tablet Take 1 tablet (25 mg total) by mouth daily before breakfast. 90 tablet 3   ferrous sulfate 325 (65 FE) MG tablet Take 325 mg by mouth 2 (two) times daily.       fluticasone (FLONASE) 50 MCG/ACT nasal spray Place 2 sprays into both nostrils daily as needed for allergies. 16 g 11   glucose blood (ONE TOUCH ULTRA TEST) test strip Use 2x a day 100 each 2   insulin glargine, 1 Unit Dial, (TOUJEO SOLOSTAR) 300 UNIT/ML Solostar Pen Inject  90 Units into the skin at bedtime. 9 mL 0   Insulin Pen Needle (B-D ULTRAFINE III SHORT PEN) 31G X 8 MM MISC USE ONCE DAILY 100 each 3   Insulin Pen Needle (BD PEN NEEDLE NANO 2ND GEN) 32G X 4 MM MISC Use once daily to check blood sugar 100 each 3   Insulin Pen Needle 32G X 4 MM MISC Use 4x a day 300 each 3   levothyroxine (SYNTHROID) 175 MCG tablet TAKE 1 TABLET BY MOUTH EVERY DAY BEFORE BREAKFAST 30 tablet 0   metFORMIN (GLUCOPHAGE) 1000 MG tablet TAKE 1 TABLET BY MOUTH TWICE A DAY WITH MEALS 60 tablet 0   pantoprazole (PROTONIX) 40 MG tablet Take 1 tablet (40 mg total) by mouth daily. 90 tablet 2   potassium chloride (KLOR-CON) 20 MEQ packet Take 20 mEq by mouth as needed.     rosuvastatin (CRESTOR) 10 MG tablet Take 1 tablet (10 mg total) by mouth daily. 90 tablet 3   sertraline (ZOLOFT) 100 MG tablet TAKE 1.5 TABLETS BY MOUTH EVERY DAY 135 tablet 2   spironolactone (ALDACTONE) 25 MG tablet Take 1 tablet (25 mg total) by mouth daily. 90 tablet 3   torsemide (DEMADEX) 20 MG tablet Take 1 tablet (20 mg total) by mouth daily as needed. 30 tablet 6   TRAVATAN Z 0.004 % SOLN ophthalmic solution Place 1 drop into both eyes at bedtime.      Vitamin D, Ergocalciferol, (DRISDOL) 1.25 MG (50000 UNIT) CAPS capsule TAKE 1 CAPSULE BY MOUTH EVERY 7 DAYS 13 capsule 4   zolpidem (AMBIEN) 10 MG tablet TAKE 1/2 TO 1 TABLET BY MOUTH AT BEDTIME AS NEEDED 30 tablet 3   No current facility-administered medications on file prior to visit.   Allergies  Allergen Reactions   Tape Rash    Plastic tape   Buspirone  Hcl Nausea Only   Glipizide     REACTION: sleepy, low sugar   Ivp Dye [Iodinated Contrast Media]     Shut kidneys down   Oxycodone-Acetaminophen Itching   Family History  Problem Relation Age of Onset   Cancer Father        colon cancer   Arthritis Father        osteoarthritis   Colon cancer Father    Colon polyps Father    Transient ischemic attack Father    COPD Father    Arthritis Mother         osteoarthritis   Asthma Mother    Hypertension Mother    Multiple sclerosis Mother    Ovarian cancer Maternal Aunt    Diabetes Paternal Aunt    Diabetes Paternal Uncle    Heart disease Maternal Grandfather    PE: There were no vitals taken for this visit. Wt Readings from Last 3 Encounters:  11/24/20 267 lb 1 oz (121.1 kg)  10/30/20 264 lb 3.2 oz (119.8 kg)  10/09/20 262 lb (118.8 kg)   Constitutional:  in NAD  The physical exam was not performed (phone visit).  ASSESSMENT: 1. DM2, insulin-dependent, uncontrolled, without long-term complications but with hyperglycemia  2. Iatrogenic Hypothyroidism - h/o med noncompliance  3. [place] -No RAI treatment was necessary  4.  Noncompliance with the treatment plan  PLAN:  1. Patient with history of uncontrolled type 2 diabetes, on basal insulin and oral medications: Metformin and SGLT2 inhibitor.  She stopped Ozempic due to GI symptoms in 2021.  After this, sugars increased to 300s-400s.  We had to start Humalog and discussed about improving compliance with medications, blood sugar checks, and visits.  I did advise her in the past that if she does not come for follow-up appointments, I would not be able to help her.  At this visit, she presents after 1 year and 3 months from the previous visit.  She also wanted to do a virtual visit today but at today's visit she was not available to do this so we had to do a phone visit. -Unfortunately, due to the virtual nature of the appointment we could not check an HbA1c. -At last visit, sugars were stable overnight but increasing fairly abruptly after 8 AM.  She was eating takeout at work in the morning and was not bolusing for these.  I strongly advised her to bolus for all meals and we increased her Humalog doses.  We also discussed that she may need to take 5 to 10 units of Humalog around 8 AM if drinking coffee but even only for correction if the sugars are high.  We did not  change the rest of the regimen.   -At today's visit, she tells me that she is currently off metformin and Toujeo for the last almost 2 months after she ran out and could not refill them.  Her sugars have been very high, in the 300s to 500s.   CGM interpretation: -At today's visit, we reviewed her CGM downloads: It appears that 0% (!!!) of values are in target range (goal >70%), while 100% are higher than 180 (goal <25%), and 0% are lower than 70 (goal <4%).  The calculated average blood sugar is 395.   -Reviewing the CGM trends, the blood sugars are barely visible on the ambulatory glucose profile blood test 100% of them are above target. - We discussed that this is unacceptable, and puts her at much higher risk lesions.  She has also been off Humalog as she remembered that this gave her nausea and diarrhea.  I believe that she is confusing this to Ozempic.  Therefore, I recommended to also restart Humalog.  I sent prescriptions for all of these to her pharmacy for 3 months, but without refills.  I again advised the patient that if she is not coming for appointments and not taking the medications as advised, I would need to discharge her from the practice.  If she does not present for the next appointment in 1.5 months, I will assume that she decided not to return anymore. - I suggested to:  Patient Instructions  Please restart: - Metformin 1000 mg 2x a day - Toujeo 45 x 2 injections at night - Humalog 15-20 units 3x a day 15 min before meals You may need 5-10 units of Humalog around 8 am even if you do not drink coffee or eat b'fast.  Continue: - Jardiance 25 mg before b'fast  Please restart:  - Levothyroxine 175 mcg daily.  Take the thyroid hormone every day with water, at least 30 minutes before breakfast, separated by at least 4 hours from: - acid reflux medications - calcium - iron - multivitamins  Please return in 1.5 months.  2. Iatrogenic Hypothyroidism -Uncontrolled, related to  incomplete compliance with her levothyroxine dose in the past - latest thyroid labs reviewed with pt. >> normal: Lab Results  Component Value Date   TSH 4.49 11/24/2020  - she continues on LT4 175 mcg daily, however, she tells me that she has not been taking it in the last week and a half as she ran out and she did not refill it - we discussed about the fact that she absolutely needs to take this every day, without missing doses.  She is taking the thyroid hormone every day, with water, >30 minutes before breakfast, separated by >4 hours from acid reflux medications, calcium, iron, multivitamins. Pt. is taking it correctly.  At last visit she was only taking it when she was getting to work.  I again advised her to move it to the nightstand and take this when she wakes up - will check thyroid tests when she comes to the clinic, but for now I will send a new prescription for the same dose  3. Follicular PTC -Reviewed her neck ultrasound report from 11/2014 in 09/2017: No recurrence -She has chronic dysphagia but no new neck compression symptoms  4.  Noncompliance with the treatment plan -Please see above  - Total time spent on the phone with the pt: 16 min: reviewing her  sugars, previous labs, evaluations, and treatments, counseling her about her conditions (please see the discussed topics above), and developing a plan to further investigate and treat them.   Philemon Kingdom, MD PhD [facility]

## 2021-04-24 ENCOUNTER — Encounter: Payer: Self-pay | Admitting: Gastroenterology

## 2021-04-26 ENCOUNTER — Other Ambulatory Visit: Payer: Self-pay

## 2021-04-26 ENCOUNTER — Ambulatory Visit (HOSPITAL_COMMUNITY)
Admission: RE | Admit: 2021-04-26 | Discharge: 2021-04-26 | Disposition: A | Discharge status: Home / Self Care | Payer: No Typology Code available for payment source | Source: Ambulatory Visit | Attending: Internal Medicine | Admitting: Internal Medicine

## 2021-04-26 ENCOUNTER — Encounter (HOSPITAL_COMMUNITY): Payer: Self-pay | Admitting: Internal Medicine

## 2021-04-26 VITALS — BP 160/90 | HR 73 | Wt 255.0 lb

## 2021-04-26 DIAGNOSIS — E785 Hyperlipidemia, unspecified: Secondary | ICD-10-CM | POA: Diagnosis not present

## 2021-04-26 DIAGNOSIS — R002 Palpitations: Secondary | ICD-10-CM | POA: Diagnosis not present

## 2021-04-26 DIAGNOSIS — Z794 Long term (current) use of insulin: Secondary | ICD-10-CM

## 2021-04-26 DIAGNOSIS — E1165 Type 2 diabetes mellitus with hyperglycemia: Secondary | ICD-10-CM | POA: Insufficient documentation

## 2021-04-26 DIAGNOSIS — I272 Pulmonary hypertension, unspecified: Secondary | ICD-10-CM | POA: Diagnosis not present

## 2021-04-26 DIAGNOSIS — Z9989 Dependence on other enabling machines and devices: Secondary | ICD-10-CM

## 2021-04-26 DIAGNOSIS — I5032 Chronic diastolic (congestive) heart failure: Secondary | ICD-10-CM | POA: Insufficient documentation

## 2021-04-26 DIAGNOSIS — Z79899 Other long term (current) drug therapy: Secondary | ICD-10-CM | POA: Diagnosis not present

## 2021-04-26 DIAGNOSIS — E1122 Type 2 diabetes mellitus with diabetic chronic kidney disease: Secondary | ICD-10-CM | POA: Diagnosis not present

## 2021-04-26 DIAGNOSIS — Z6841 Body Mass Index (BMI) 40.0 and over, adult: Secondary | ICD-10-CM | POA: Diagnosis not present

## 2021-04-26 DIAGNOSIS — R0789 Other chest pain: Secondary | ICD-10-CM | POA: Insufficient documentation

## 2021-04-26 DIAGNOSIS — E890 Postprocedural hypothyroidism: Secondary | ICD-10-CM | POA: Insufficient documentation

## 2021-04-26 DIAGNOSIS — Z79890 Hormone replacement therapy (postmenopausal): Secondary | ICD-10-CM | POA: Insufficient documentation

## 2021-04-26 DIAGNOSIS — N189 Chronic kidney disease, unspecified: Secondary | ICD-10-CM | POA: Insufficient documentation

## 2021-04-26 DIAGNOSIS — I2721 Secondary pulmonary arterial hypertension: Secondary | ICD-10-CM | POA: Insufficient documentation

## 2021-04-26 DIAGNOSIS — I130 Hypertensive heart and chronic kidney disease with heart failure and stage 1 through stage 4 chronic kidney disease, or unspecified chronic kidney disease: Secondary | ICD-10-CM | POA: Insufficient documentation

## 2021-04-26 DIAGNOSIS — R42 Dizziness and giddiness: Secondary | ICD-10-CM | POA: Diagnosis not present

## 2021-04-26 DIAGNOSIS — F419 Anxiety disorder, unspecified: Secondary | ICD-10-CM | POA: Insufficient documentation

## 2021-04-26 DIAGNOSIS — E118 Type 2 diabetes mellitus with unspecified complications: Secondary | ICD-10-CM

## 2021-04-26 DIAGNOSIS — G4733 Obstructive sleep apnea (adult) (pediatric): Secondary | ICD-10-CM | POA: Diagnosis not present

## 2021-04-26 DIAGNOSIS — Z7984 Long term (current) use of oral hypoglycemic drugs: Secondary | ICD-10-CM | POA: Diagnosis not present

## 2021-04-26 MED ORDER — LOSARTAN POTASSIUM 25 MG PO TABS: 25.0000 mg | ORAL_TABLET | Freq: Every day | ORAL | 6 refills | Status: DC

## 2021-04-26 NOTE — Progress Notes (Signed)
ADVANCED HEART FAILURE CLINIC NOTE  PCP: Dr. Glori Bickers Pulmonary: Dr. Lamonte Sakai ENT: Dr. Wilburn Cornelia Endocrinologist: Dr. Renne Crigler HF Cardiologist: Dr. Haroldine Laws  HPI: [name] is a 56 y.o. female with hx of DM, HTN, HL, morbid obesity, OSA on CPAP , anxiety, diastolic HF with secondary pulmonary HTN and noncardiac CP.   Cath 2006 for CP with only catheter-induced spasm of RCA otherwise normal coronaries. EF was 65% at that time. Developed acute renal failure after CT contrast.   Nuclear Stress 02/17/12: Normal, EF 58%  Echo 10/24: EF 50-55%. Grade 2 diastolic dysfunction. RV mildly dilated. PAPP 68 mmHg   Echo 5/14: EF 93% Grade 2 diastolic dysfunction. RV looks good. No significant TR to measure PA pressure.   Echo 7/15: EF 55-60% Normal RV  Myoview 7/15: RF and perfusion normal   S/P Thyroidectomy January 2014.   Zio Patch 12/2017 8% PVCs.  Follow up in HF clinic 12/2017 with NYHA II symptoms, echo suggesting moderate PH (likely WHO Group II & III).   She was lost to follow up and presented back to AHF 6/22 w/ atypical chest pain and palpitations. Zio (6/22) no high-grade arrhythmias, mostly SR rare extra beats. Lexiscan myoview ordered for atypical chest pain-->low risk study, EF 53%.  Echo 10/19 EF 60-65%   Today she returns for HF follow up. Says she is under a lot of stress. Works at [employer] in [employer]. Occasional palpitations and chest fluttering. No edema, orthopnea or PND. Gets dizzy if she stands up too quickly.    Past Medical History:  Diagnosis Date   Allergy    SEASONAL   Anemia    iron deficiency   Anxiety    Arthritis    Asthma    a. 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   Cellulitis and abscess of leg 02/2014   CHF (congestive heart failure) ([place])    Chronic kidney disease    /57   Complication of anesthesia    DIFFICULT WAKING- 2011   Depression    a. Stress reaction 08/2011 in multiple social  stressors.   Diabetes mellitus    Dysrhythmia    Gastritis    GERD (gastroesophageal reflux disease)    Heart murmur    Hemorrhoid    Hyperlipidemia    Hypertension    Hyperthyroidism    Insomnia    Morbid obesity (HCC)    OSA (obstructive sleep apnea) 01/17/2012   Papillary thyroid carcinoma (HCC)    PCOS (polycystic ovarian syndrome)    Sleep apnea    C PAP   Thyroid cancer ([place]) 2014   Vertigo     Current Outpatient Medications  Medication Sig Dispense Refill   Albuterol Sulfate (PROAIR RESPICLICK) 017 (90 Base) MCG/ACT AEPB Inhale 2 puffs into the lungs every 6 (six) hours as needed for up to 6 doses. 1 each 1   buPROPion (WELLBUTRIN XL) 150 MG 24 hr tablet TAKE 1 TABLET BY MOUTH EVERY DAY 90 tablet 2   carvedilol (COREG) 6.25 MG tablet TAKE 1 TABLET BY MOUTH 2 TIMES DAILY WITH A MEAL. 180 tablet 3   cetirizine (ZYRTEC) 10 MG tablet TAKE 1 TABLET BY MOUTH EVERY DAY 90 tablet 2   Continuous Blood Gluc Sensor (FREESTYLE LIBRE 3 SENSOR) MISC 1 each by Does not apply route every 14 (fourteen) days. 6 each 0   empagliflozin (JARDIANCE) 25 MG TABS tablet Take 1 tablet (25 mg total) by mouth daily before  breakfast. 90 tablet 0   ferrous sulfate 325 (65 FE) MG tablet Take 325 mg by mouth 2 (two) times daily.       fluticasone (FLONASE) 50 MCG/ACT nasal spray Place 2 sprays into both nostrils daily as needed for allergies. 16 g 11   glucose blood (ONE TOUCH ULTRA TEST) test strip Use 2x a day 100 each 2   insulin glargine, 1 Unit Dial, (TOUJEO SOLOSTAR) 300 UNIT/ML Solostar Pen Inject 90 Units into the skin at bedtime. 9 mL 0   insulin lispro (HUMALOG KWIKPEN) 200 UNIT/ML KwikPen Inject 10-20 Units into the skin 3 (three) times daily before meals. 30 mL 0   Insulin Pen Needle (B-D ULTRAFINE III SHORT PEN) 31G X 8 MM MISC USE ONCE DAILY 100 each 3   Insulin Pen Needle (BD PEN NEEDLE NANO 2ND GEN) 32G X 4 MM MISC Use once daily to check blood sugar 100 each 3   Insulin Pen Needle 32G X 4  MM MISC Use 4x a day 300 each 3   levothyroxine (SYNTHROID) 175 MCG tablet Take 1 tablet daily in am, fasting 90 tablet 3   metFORMIN (GLUCOPHAGE) 1000 MG tablet Take 1 tablet (1,000 mg total) by mouth 2 (two) times daily with a meal. 180 tablet 0   pantoprazole (PROTONIX) 40 MG tablet Take 1 tablet (40 mg total) by mouth daily. 90 tablet 2   potassium chloride (KLOR-CON) 20 MEQ packet Take 20 mEq by mouth as needed.     rosuvastatin (CRESTOR) 10 MG tablet Take 1 tablet (10 mg total) by mouth daily. 90 tablet 3   sertraline (ZOLOFT) 100 MG tablet TAKE 1.5 TABLETS BY MOUTH EVERY DAY 135 tablet 2   spironolactone (ALDACTONE) 25 MG tablet Take 1 tablet (25 mg total) by mouth daily. 90 tablet 3   torsemide (DEMADEX) 20 MG tablet Take 1 tablet (20 mg total) by mouth daily as needed. 30 tablet 6   TRAVATAN Z 0.004 % SOLN ophthalmic solution Place 1 drop into both eyes at bedtime.      zolpidem (AMBIEN) 10 MG tablet TAKE 1/2 TO 1 TABLET BY MOUTH AT BEDTIME AS NEEDED 30 tablet 3   No current facility-administered medications for this encounter.   Wt Readings from Last 3 Encounters:  04/26/21 115.7 kg (255 lb)  11/24/20 121.1 kg (267 lb 1 oz)  10/30/20 119.8 kg (264 lb 3.2 oz)   BP (!) 160/90    Pulse 73    Wt 115.7 kg (255 lb)    SpO2 98%    BMI 42.43 kg/m   PHYSICAL EXAM General:  Well appearing. No resp difficulty HEENT: normal Neck: supple. no JVD. Carotids 2+ bilat; no bruits. No lymphadenopathy or thryomegaly appreciated. Cor: PMI nondisplaced. Regular rate & rhythm. No rubs, gallops or murmurs. Lungs: clear Abdomen: obese soft, nontender, nondistended. No hepatosplenomegaly. No bruits or masses. Good bowel sounds. Extremities: no cyanosis, clubbing, rash, edema Neuro: alert & orientedx3, cranial nerves grossly intact. moves all 4 extremities w/o difficulty. Affect pleasant    ASSESSMENT & PLAN: 1. Chronic Diastolic Heart Failure - Previous echo consistent with at least moderate PH.  (likely WHO Group II and III), per Dr. Haroldine Laws - Previously had TR on exam. Echo 08/2016 unable to visualize tricuspid valve, EF 60-65%.  - Echo (10/19): EF 60-65%, Grade II DD - Myoview (7/22): EF 53% (personally discussed with Dr. Haroldine Laws, no repeat echo warranted at this time). - Stable NYHA II-III, functional class difficult due to body habitus  and general physical inactivity. Volume good today. -  Will add back losartan 25mg  qhs as tolerated with orthostasis Consider Entresto, but will hold off for now since she says losartan made her light-headed. Add compression hose for possible autonomic dysfunction - Continue carvedilol to 6.25 mg bid.  - Continue torsemide 20 mg PRN - Continue spironolactone 25 mg daily. - Continue Jardiance 25 mg daily. - BMET & BNP today. - Can likely transition back to General Cardiology.    2. Pulmonary HTN - As above. No change.   3. Chest pain - Cath 2006 and myoview in 2013 & 2015 ok.  Carlton Adam myoview (7/22): normal, low risk study. EF 53%. - No further CP.  4. HTN - Elevated today but she says BP usually not this elevated and has orthostasis - Will restart losartan 25 mg qhs. Place compression hose - Compliant w/ CPAP. - Again encouraged her to find ways to cope with her life stressors and practice self-care.  5. Palpitations - Only 8% PVCs on Ziopatch 12/2017. - Zio 14 day (7/22): SR, rare extra beats, no high-grade arrhythmias. - Likely due to stress. No High grade dysrhythmias on monitor  6. DM 2, uncontrolled - Followed by Dr. Cruzita Lederer.  7. OSA  - She is compliant with CPAP.  8. Obesity - Body mass index is 42.43 kg/m. - Needs weight loss. Continue GLP1RA  9. Probable PFO on TTE - This is physiologic and seen in 30% of people. Right side is normal in size. No embolic phenomenon.  - No further w/u needed.   10. S/p thyroidectomy -> Hypothyroidism on synthroid - Followed by endocrinologist (Dr Cruzita Lederer).  Glori Bickers, MD   10:40 PM

## 2021-04-26 NOTE — Patient Instructions (Addendum)
Medication Changes:  Start Losartan 25mg  daily at bedtime  Lab Work:  none  Testing/Procedures:  none  Referrals:  none  Special Instructions // Education:  Do the following things EVERYDAY: Weigh yourself in the morning before breakfast. Write it down and keep it in a log. Take your medicines as prescribed Eat low salt foods--Limit salt (sodium) to 2000 mg per day.  Stay as active as you can everyday Limit all fluids for the day to less than 2 liters   Follow-Up in: 6 months (August 2023) ** Please call the office in June to make your appointment**  At the [facility], you and your health needs are our priority. We have a designated team specialized in the treatment of Heart Failure. This Care Team includes your primary Heart Failure Specialized Cardiologist (physician), Advanced Practice Providers (APPs- Physician Assistants and Nurse Practitioners), and Pharmacist who all work together to provide you with the care you need, when you need it.   You may see any of the following providers on your designated Care Team at your next follow up:  Dr Glori Bickers Dr Haynes Kerns, NP Lyda Jester, [place] [facility] [place], [place] Audry Riles, PharmD   Please be sure to bring in all your medications bottles to every appointment.   Need to Contact us:  If you have any questions or concerns before your next appointment please send [place] a message through [place] or call our office at [phone].    TO LEAVE A MESSAGE FOR THE NURSE SELECT OPTION 2, PLEASE LEAVE A MESSAGE INCLUDING: YOUR NAME DATE OF BIRTH CALL BACK NUMBER REASON FOR CALL**this is important as we prioritize the call backs  YOU WILL RECEIVE A CALL BACK THE SAME DAY AS LONG AS YOU CALL BEFORE 4:00 PM

## 2021-05-09 ENCOUNTER — Other Ambulatory Visit: Payer: Self-pay | Admitting: Family Medicine

## 2021-05-09 NOTE — Telephone Encounter (Signed)
Refill request for ZOLPIDEM TARTRATE 10 MG TABLET  LOV - 11/24/20 Next OV - not scheduled Last refill - 01/08/21 #30/3

## 2021-06-05 ENCOUNTER — Encounter: Payer: Self-pay | Admitting: Gastroenterology

## 2021-06-05 ENCOUNTER — Ambulatory Visit: Payer: No Typology Code available for payment source | Admitting: Gastroenterology

## 2021-06-05 DIAGNOSIS — Z800 Family history of malignant neoplasm of digestive organs: Secondary | ICD-10-CM

## 2021-06-05 DIAGNOSIS — Z86010 Personal history of colonic polyps: Secondary | ICD-10-CM | POA: Diagnosis not present

## 2021-06-05 DIAGNOSIS — R195 Other fecal abnormalities: Secondary | ICD-10-CM

## 2021-06-05 MED ORDER — CHOLESTYRAMINE 4 G PO PACK: 4.0000 g | PACK | Freq: Every day | ORAL | 6 refills | Status: DC

## 2021-06-05 MED ORDER — NA SULFATE-K SULFATE-MG SULF 17.5-3.13-1.6 GM/177ML PO SOLN: 1.0000 | ORAL | 0 refills | Status: DC

## 2021-06-05 NOTE — Progress Notes (Signed)
Review of pertinent gastrointestinal problems: ?1. Family history of colon cancer, personal history of adenomatous colon polyps. Her father had colon cancer; diagnosed in his late 61s. Colonoscopy 10 2011 [name] found 2 subcentimeter adenomas, hemorrhoids.  Colonoscopy 07/2015 single subcentimeter tubular adenoma removed.  Hemorrhoids again noted ?2. Dyspepsia, functional ?: EGD [name] 10 2011 found mild nonspecific gastritis that was H. pylori negative. ? ? ?HPI: ?This is a very pleasant 56 year old woman ? ?I last saw her at the time of a colonoscopy in June 2017.  See that results summarized above.  Given her family history of chronic interpersonal history of precancerous colon polyps she is "due" for a repeat colonoscopy in 2 or 3 months. ? ?She has had chronic loose stools for many many years.  Really since her gallbladder was removed.  This will happen 2-4 times a day sometimes depends on what she eats. ? ?Certain foods cause a lot of abdominal discomfort such as nuts which cause bloating, sour belches and gassiness. ? ?Sometimes she will feel pulsations in her abdomen ? ?Her weight fluctuates up and down quite a bit. ? ? ? ?Old Data Reviewed: ? ? ? ? ?Review of systems: ?Pertinent positive and negative review of systems were noted in the above HPI section. All other review negative. ? ? ?Past Medical History:  ?Diagnosis Date  ? Allergy   ? SEASONAL  ? Anemia   ? iron deficiency  ? Anxiety   ? Arthritis   ? Asthma   ? a. 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  ? Cellulitis and abscess of leg 02/2014  ? CHF (congestive heart failure) ([place])   ? Chronic kidney disease   ? /13  ? Complication of anesthesia   ? DIFFICULT WAKING- 2011  ? Depression   ? a. Stress reaction 08/2011 in multiple social stressors.  ? Diabetes mellitus   ? Dysrhythmia   ? Gastritis   ? GERD (gastroesophageal reflux disease)   ? Heart murmur   ? Hemorrhoid   ? Hyperlipidemia   ? Hypertension   ?  Hyperthyroidism   ? Insomnia   ? Morbid obesity ([place])   ? OSA (obstructive sleep apnea) 01/17/2012  ? Papillary thyroid carcinoma ([place])   ? PCOS (polycystic ovarian syndrome)   ? Sleep apnea   ? C PAP  ? Thyroid cancer ([place]) 2014  ? Vertigo   ? ? ?Past Surgical History:  ?Procedure Laterality Date  ? CATARACT EXTRACTION Left 04/13/2020  ? CATARACT EXTRACTION Right 06/01/2020  ? CHOLECYSTECTOMY    ? HEMORRHOID SURGERY    ? INTRAUTERINE DEVICE INSERTION  03/25/2009  ? SHOULDER SURGERY  06/23/2009  ? THYROIDECTOMY  04/10/2012  ? Procedure: THYROIDECTOMY;  Surgeon: Jerrell Belfast, MD;  Location: [place];  Service: ENT;  Laterality: N/A;  Total Thyroidectomy  ? TONSILLECTOMY AND ADENOIDECTOMY  CHILD  ? TUBAL LIGATION    ? ? ?Current Outpatient Medications  ?Medication Instructions  ? Albuterol Sulfate (PROAIR RESPICLICK) 035 (90 Base) MCG/ACT AEPB 2 puffs, Inhalation, Every 6 hours PRN  ? buPROPion (WELLBUTRIN XL) 150 MG 24 hr tablet TAKE 1 TABLET BY MOUTH EVERY DAY  ? carvedilol (COREG) 6.25 MG tablet TAKE 1 TABLET BY MOUTH 2 TIMES DAILY WITH A MEAL.  ? cetirizine (ZYRTEC) 10 MG tablet TAKE 1 TABLET BY MOUTH EVERY DAY  ? Continuous Blood Gluc Sensor (FREESTYLE LIBRE 3 SENSOR) MISC 1 each, Does not apply, Every 14 days  ? empagliflozin (  JARDIANCE) 25 mg, Oral, Daily before breakfast  ? ferrous sulfate 325 mg, 2 times daily  ? fluticasone (FLONASE) 50 MCG/ACT nasal spray 2 sprays, Each Nare, Daily PRN  ? glucose blood (ONE TOUCH ULTRA TEST) test strip Use 2x a day  ? HumaLOG KwikPen 10-20 Units, Subcutaneous, 3 times daily before meals  ? Insulin Pen Needle (B-D ULTRAFINE III SHORT PEN) 31G X 8 MM MISC USE ONCE DAILY  ? Insulin Pen Needle (BD PEN NEEDLE NANO 2ND GEN) 32G X 4 MM MISC Use once daily to check blood sugar  ? Insulin Pen Needle 32G X 4 MM MISC Use 4x a day  ? levothyroxine (SYNTHROID) 175 MCG tablet Take 1 tablet daily in am, fasting  ? losartan (COZAAR) 25 mg, Oral, Daily at bedtime  ? metFORMIN (GLUCOPHAGE)  1,000 mg, Oral, 2 times daily with meals  ? pantoprazole (PROTONIX) 40 mg, Oral, Daily  ? potassium chloride (KLOR-CON) 20 MEQ packet 20 mEq, Oral, As needed  ? rosuvastatin (CRESTOR) 10 mg, Oral, Daily  ? sertraline (ZOLOFT) 100 MG tablet TAKE 1.5 TABLETS BY MOUTH EVERY DAY  ? spironolactone (ALDACTONE) 25 mg, Oral, Daily  ? torsemide (DEMADEX) 20 mg, Oral, Daily PRN  ? Toujeo SoloStar 90 Units, Subcutaneous, Daily at bedtime  ? TRAVATAN Z 0.004 % SOLN ophthalmic solution 1 drop, Both Eyes, Daily at bedtime  ? zolpidem (AMBIEN) 10 MG tablet TAKE 1/2 TO 1 TABLET BY MOUTH AT BEDTIME AS NEEDED  ? ? ?Allergies as of 06/05/2021 - Review Complete 06/05/2021  ?Allergen Reaction Noted  ? Tape Rash 04/06/2012  ? Buspirone hcl Nausea Only 09/11/2006  ? Glipizide  07/13/2007  ? Ivp dye [iodinated contrast media]  01/31/2012  ? Oxycodone-acetaminophen Itching   ? ? ?Family History  ?Problem Relation Age of Onset  ? Arthritis Mother   ?     osteoarthritis  ? Asthma Mother   ? Hypertension Mother   ? Multiple sclerosis Mother   ? Cancer Father   ?     colon cancer  ? Arthritis Father   ?     osteoarthritis  ? Colon cancer Father   ? Colon polyps Father   ? Transient ischemic attack Father   ? COPD Father   ? Heart disease Maternal Grandfather   ? Ovarian cancer Maternal Aunt   ? Diabetes Paternal Aunt   ? Diabetes Paternal Uncle   ? Stomach cancer Neg Hx   ? Esophageal cancer Neg Hx   ? ? ?Social History  ? ?Socioeconomic History  ? Marital status: Married  ?  Spouse name: Selenne Coggin  ? Number of children: 2  ? Years of education: Some college  ? Highest education level: Not on file  ?Occupational History  ? Occupation: [employer]  ?Tobacco Use  ? Smoking status: Former  ?  Packs/day: 0.20  ?  Years: 5.00  ?  Pack years: 1.00  ?  Types: Cigarettes  ?  Quit date: 03/25/2004  ?  Years since quitting: 17.2  ? Smokeless tobacco: Never  ?Vaping Use  ? Vaping Use: Never used  ?Substance and Sexual Activity  ? Alcohol  use: No  ?  Alcohol/week: 0.0 standard drinks  ? Drug use: No  ? Sexual activity: Yes  ?Other Topics Concern  ? Not on file  ?Social History Narrative  ? Lives with son/husband  ? Caffeine use: daily  ? Right handed   ? ?Social Determinants of Health  ? ?Financial Resource Strain:  Not on file  ?Food Insecurity: Not on file  ?Transportation Needs: Not on file  ?Physical Activity: Not on file  ?Stress: Not on file  ?Social Connections: Not on file  ?Intimate Partner Violence: Not on file  ? ? ? ?Physical Exam: ?Ht '5\' 5"'$  (1.651 m)   Wt 263 lb 3.2 oz (119.4 kg)   BMI 43.80 kg/m?  ?Constitutional: generally well-appearing ?Psychiatric: alert and oriented x3 ?Eyes: extraocular movements intact ?Mouth: oral pharynx moist, no lesions ?Neck: supple no lymphadenopathy ?Cardiovascular: heart regular rate and rhythm ?Lungs: clear to auscultation bilaterally ?Abdomen: soft, nontender, nondistended, no obvious ascites, no peritoneal signs, normal bowel sounds ?Extremities: no lower extremity edema bilaterally ?Skin: no lesions on visible extremities ? ? ?Assessment and plan: ?56 y.o. female with family history colon cancer, personal history of adenomatous polyps, chronic loose stools, general dyspepsia ? ?First I recommended a trial of cholestyramine powder to see if that helps some of her upper GI and certainly her chronic intermittent loose stools which can happen after gallbladder resection. ? ?Second she has a family history of colon cancer in her father and a personal history of adenomatous polyps and she is "due" for repeat colonoscopy around now.  We will schedule that to be performed at her soonest convenience.  I see no reason for any further blood tests or imaging studies prior to then. ? ?Please see the "Patient Instructions" section for addition details about the plan. ? ? ?Owens Loffler, MD ?[facility] ?06/05/2021, 2:59 PM ? ?Cc: Tower, Wynelle Fanny, MD ? ?Total time on date of encounter was 40  minutes  (this included time spent preparing to see the patient reviewing records; obtaining and/or reviewing separately obtained history; performing a medically appropriate exam and/or evaluation; counseling and ed

## 2021-06-05 NOTE — Patient Instructions (Addendum)
If you are age 56 or younger, your body mass index should be between 19-25. Your Body mass index is 43.8 kg/m?Marland Kitchen If this is out of the aformentioned range listed, please consider follow up with your Primary Care Provider.  ?________________________________________________________ ? ?The [place] [facility] providers would like to encourage you to use [facility] to communicate with providers for non-urgent requests or questions.  Due to long hold times on the telephone, sending your provider a message by [facility] may be a faster and more efficient way to get a response.  Please allow 48 business hours for a response.  Please remember that this is for non-urgent requests.  ?_______________________________________________________ ? ?We have sent the following medications to your pharmacy for you to pick up at your convenience: ? ?START: Cholestyramine 4 grams daily. ? ?You have been scheduled for a colonoscopy. Please follow written instructions given to you at your visit today.  ?Please pick up your prep supplies at the pharmacy within the next 1-3 days. ?If you use inhalers (even only as needed), please bring them with you on the day of your procedure. ? ?Due to recent changes in healthcare laws, you may see the results of your imaging and laboratory studies on MyChart before your provider has had a chance to review them.  We understand that in some cases there may be results that are confusing or concerning to you. Not all laboratory results come back in the same time frame and the provider may be waiting for multiple results in order to interpret others.  Please give [place] 48 hours in order for your provider to thoroughly review all the results before contacting the office for clarification of your results.  ? ?Thank you for entrusting me with your care and choosing [facility]. ? ?Dr Ardis Hughs ? ?

## 2021-06-11 ENCOUNTER — Encounter: Payer: Self-pay | Admitting: Internal Medicine

## 2021-06-11 ENCOUNTER — Ambulatory Visit: Payer: No Typology Code available for payment source | Admitting: Internal Medicine

## 2021-06-11 ENCOUNTER — Other Ambulatory Visit: Payer: Self-pay

## 2021-06-11 VITALS — BP 140/82 | HR 84 | Ht 65.0 in | Wt 268.6 lb

## 2021-06-11 DIAGNOSIS — C73 Malignant neoplasm of thyroid gland: Secondary | ICD-10-CM

## 2021-06-11 DIAGNOSIS — E890 Postprocedural hypothyroidism: Secondary | ICD-10-CM

## 2021-06-11 DIAGNOSIS — E1165 Type 2 diabetes mellitus with hyperglycemia: Secondary | ICD-10-CM

## 2021-06-11 DIAGNOSIS — E1142 Type 2 diabetes mellitus with diabetic polyneuropathy: Secondary | ICD-10-CM

## 2021-06-11 LAB — POCT GLYCOSYLATED HEMOGLOBIN (HGB A1C): Hemoglobin A1C: 9.1 % — AB (ref 4.0–5.6)

## 2021-06-11 LAB — T4, FREE: Free T4: 1.1 ng/dL (ref 0.60–1.60)

## 2021-06-11 LAB — TSH: TSH: 0.81 u[IU]/mL (ref 0.35–5.50)

## 2021-06-11 MED ORDER — TOUJEO SOLOSTAR 300 UNIT/ML ~~LOC~~ SOPN: 90.0000 [IU] | PEN_INJECTOR | Freq: Every day | SUBCUTANEOUS | 3 refills | Status: DC

## 2021-06-11 MED ORDER — FREESTYLE LIBRE 3 SENSOR MISC: 1.0000 | 3 refills | Status: DC

## 2021-06-11 MED ORDER — INSULIN PEN NEEDLE 32G X 4 MM MISC: 3 refills | Status: DC

## 2021-06-11 MED ORDER — EMPAGLIFLOZIN 25 MG PO TABS: 25.0000 mg | ORAL_TABLET | Freq: Every day | ORAL | 3 refills | Status: DC

## 2021-06-11 MED ORDER — METFORMIN HCL 1000 MG PO TABS: 1000.0000 mg | ORAL_TABLET | Freq: Two times a day (BID) | ORAL | 3 refills | Status: DC

## 2021-06-11 MED ORDER — HUMALOG KWIKPEN 200 UNIT/ML ~~LOC~~ SOPN: 10.0000 [IU] | PEN_INJECTOR | Freq: Three times a day (TID) | SUBCUTANEOUS | 3 refills | Status: DC

## 2021-06-11 NOTE — Progress Notes (Signed)
Patient ID: [name], female   DOB: 01/19/1966, 56 y.o.   MRN: [id number] ? ?This visit occurred during the SARS-CoV-2 public health emergency.  Safety protocols were in place, including screening questions prior to the visit, additional usage of staff PPE, and extensive cleaning of exam room while observing appropriate contact time as indicated for disinfecting solutions.  ? ?HPI: ?[name] is a 56 y.o.-year-old female, returning for f/u for DM2, dx 2008, insulin-dependent since ~2011, uncontrolled, with complications (peripheral neuropathy, dCHF), h/o papillary thyroid cancer and iatrogenic hypothyroidism.  Last visit 1.5 months ago, previous visit 1 year and 3 months ago. ? ?Interim history: ?At last visit, she returned after long absence (virtual visit).  She was worried about her husband who had a liver transplant and now has prostate cancer. She was not taking care of herself and was off all of her diabetic medicines and also levothyroxine. ?At this visit, she has no increased urination, blurry vision, nausea, chest pain.  Does feel a little bit more dizzy, especially after starting losartan. ? ?DM2: ?Reviewed HbA1c levels: ?Lab Results  ?Component Value Date  ? HGBA1C 10.8 (A) 12/09/2019  ? HGBA1C 10.2 (A) 11/27/2018  ? HGBA1C 9.6 (H) 08/14/2018  ?  ?She is on: ?- Toujeo 45 x 2 injections at night >> off since 02/2021 >> restarted 03/2021 ?- Metformin 1000 mg 2x a day  >> off since 02/2021  >> restarted 03/2021 ?- Invokana 300 mg in a.m. >> Jardiance 25 mg before b'fast ?- Humalog  >> stopped 2/2 nausea, diarrhea (?) >>  Restarted 03/2021: 15-20 units 3x a day 15 min before meals ?You may need 5-10 units of Humalog around 8 am even if you do not drink coffee or eat b'fast. ?- - started 08/2017 >> restarted >> stopped 2/2 nausea in 2020 ?She was also on Glipizide but developed hypoglycemia >> loss of consciousness. She was also on Amaryl >> taken off.  ? ?She checks sugars >4x a day with her  Libre CGM: ? ? ?Previously: ?- am: 170-220 >> 140-160 >> 132-181 >> 175-269 >> 300-400 ?- before lunch: 116-163 >> n/c >> 115 >> 310-380 >> n/c ?- before dinner: 113 >> n/c >> 106, 156  >> 214-349 >> 400s-500 ?- bedtime: 155-178, 211 >> n/c >> 160s >> 153, 159 >> n/c ? ? ? ?Previously: ? ? ?Pt's meals are: ?- Breakfast: skips ?- 10 o'clock: banana, apple, nectarine ?- Lunch: fast food ?- Dinner: goes out to eat around 8 o'clock: chicken sandwich or a wrap ?- Snacks: 0, no concentrated sweets ? ?-No CKD, last BUN/creatinine:  ?Lab Results  ?Component Value Date  ? BUN 11 11/24/2020  ? CREATININE 0.70 11/24/2020  ?Not on ACE inhibitor/ARB ? ?-+ HL; last set of lipids: ?Lab Results  ?Component Value Date  ? CHOL 215 (H) 11/24/2020  ? HDL 46 (L) 11/24/2020  ? LDLCALC 127 (H) 11/24/2020  ? LDLDIRECT 140.0 09/29/2019  ? TRIG 274 (H) 11/24/2020  ? CHOLHDL 4.7 11/24/2020  ?On Crestor 10. ? ?- last eye exam was in 01/2021: No DR reportedly, + glaucoma. [facility].Had intraocular lens transplant B in 2022. ? ?- she has Numbness and tingling in her feet.  Up-to-date with foot exam in 11/2020. ? 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  Not have to have RAI treatment ? ?  Neck U/S (11/24/2014): Post thyroidectomy.  No suspicious lymphadenopathy or soft tissue. ? ?Neck U/S (10/07/2017): Post total thyroidectomy without evidence residual, locally recurrent or metastatic disease. ? ?Pt restarted levothyroxine 175 mcg daily, taken: ?- At last visit she was taking it when you did not work but now in a pillbox ?- in am ?- fasting ?- at least 30 min from b'fast ?- no calcium ?- + iron at lunchtime and at night ?- no multivitamins ?- + PPIs at lunchtime ?- off Biotin ? ?She is not compliant with doses.  Her TFTs are fluctuating. ?Lab  Results  ?Component Value Date  ? TSH 4.49 11/24/2020  ? TSH 23.95 (H) 09/29/2019  ? TSH 20.95 (H) 08/14/2018  ? TSH 2.011 12/15/2017  ? TSH 6.32 (H) 08/01/2017  ? TSH 2.83 05/23/2017  ? TSH 14.73 (H) 07/26/2016  ? TSH 9.17 (A) 04/02/2016  ? TSH 11.83 (H) 06/16/2015  ? TSH 1.82 11/03/2014  ? ?Pt denies: ?- feeling nodules in neck ?- hoarseness ?- choking ?- SOB with lying down ?But has chronic dysphagia. ? ?ROS: ?+ see HPI ?Musculoskeletal: + muscle aches/+ joint aches ?Neurological: no tremors/+ numbness/+ tingling/no dizziness ? ?I reviewed pt's medications, allergies, PMH, social hx, family hx, and changes were documented in the history of present illness. Otherwise, unchanged from my initial visit note. ? ?Past Medical History:  ?Diagnosis Date  ? Allergy   ? SEASONAL  ? Anemia   ? iron deficiency  ? Anxiety   ? Arthritis   ? Asthma   ? a. 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  ? Cellulitis and abscess of leg 02/2014  ? CHF (congestive heart failure) ([place])   ? Chronic kidney disease   ? /13  ? Complication of anesthesia   ? DIFFICULT WAKING- 2011  ? Depression   ? a. Stress reaction 08/2011 in multiple social stressors.  ? Diabetes mellitus   ? Dysrhythmia   ? Gastritis   ? GERD (gastroesophageal reflux disease)   ? Heart murmur   ? Hemorrhoid   ? Hyperlipidemia   ? Hypertension   ? Hyperthyroidism   ? Insomnia   ? Morbid obesity ([place])   ? OSA (obstructive sleep apnea) 01/17/2012  ? Papillary thyroid carcinoma ([place])   ? PCOS (polycystic ovarian syndrome)   ? Sleep apnea   ? C PAP  ? Thyroid cancer ([place]) 2014  ? Vertigo   ? ?Past Surgical History:  ?Procedure Laterality Date  ? CATARACT EXTRACTION Left 04/13/2020  ? CATARACT EXTRACTION Right 06/01/2020  ? CHOLECYSTECTOMY    ? HEMORRHOID SURGERY    ? INTRAUTERINE DEVICE INSERTION  03/25/2009  ? SHOULDER SURGERY  06/23/2009  ? THYROIDECTOMY  04/10/2012  ? Procedure: THYROIDECTOMY;  Surgeon: Monica Belfast, MD;  Location: [place];  Service: ENT;  Laterality: N/A;  Total Thyroidectomy  ? TONSILLECTOMY AND ADENOIDECTOMY  CHILD  ? TUBAL LIGATION    ? ?Social History  ? ?Socioeconomic History  ? Marital status: Married  ?  Spouse name: Alohilani Levenhagen  ? Number of children: 2  ? Years of education: Some college  ? Highest education level: Not on file  ?Occupational History  ? Occupation: [employer]  ?Tobacco Use  ? Smoking status: Former  ?  Packs/day: 0.20  ?  Years: 5.00  ?  Pack years: 1.00  ?  Types: Cigarettes  ?  Quit date: 03/25/2004  ?  Years since quitting: 17.2  ?  Smokeless tobacco: Never  ?Vaping Use  ? Vaping Use: Never used  ?Substance and Sexual Activity  ? Alcohol use: No  ?  Alcohol/week: 0.0 standard drinks  ? Drug use: No  ? Sexual activity: Yes  ?Other Topics Concern  ? Not on file  ?Social History Narrative  ? Lives with son/husband  ? Caffeine use: daily  ? Right handed   ? ?Social Determinants of Health  ? ?Financial Resource Strain: Not on file  ?Food Insecurity: Not on file  ?Transportation Needs: Not on file  ?Physical Activity: Not on file  ?Stress: Not on file  ?Social Connections: Not on file  ?Intimate Partner Violence: Not on file  ? ?Current Outpatient Medications on File Prior to Visit  ?Medication Sig Dispense Refill  ? Albuterol Sulfate (PROAIR RESPICLICK) 009 (90 Base) MCG/ACT AEPB Inhale 2 puffs into the lungs every 6 (six) hours as needed for up to 6 doses. 1 each 1  ? buPROPion (WELLBUTRIN XL) 150 MG 24 hr tablet TAKE 1 TABLET BY MOUTH EVERY DAY 90 tablet 2  ? carvedilol (COREG) 6.25 MG tablet TAKE 1 TABLET BY MOUTH 2 TIMES DAILY WITH A MEAL. 180 tablet 3  ? cetirizine (ZYRTEC) 10 MG tablet TAKE 1 TABLET BY MOUTH EVERY DAY 90 tablet 2  ? cholestyramine (QUESTRAN) 4 g packet Take 1 packet (4 g total) by mouth daily. 30 each 6  ? Continuous Blood Gluc Sensor (FREESTYLE LIBRE 3 SENSOR) MISC 1 each by Does not apply route every 14 (fourteen) days. 6 each 0  ? empagliflozin (JARDIANCE) 25 MG TABS  tablet Take 1 tablet (25 mg total) by mouth daily before breakfast. 90 tablet 0  ? ferrous sulfate 325 (65 FE) MG tablet Take 325 mg by mouth 2 (two) times daily.      ? fluticasone (FLONASE) 50 MCG/ACT nasal s

## 2021-06-11 NOTE — Patient Instructions (Addendum)
Please continue: ?- Metformin 1000 mg 2x a day ?- Jardiance 25 mg before b'fast ?- Toujeo 45 x 2 injections at night ?- Humalog 15-20 units 3x a day 15 min before meals ?You may need 5-10 units of Humalog around 8 am even if you do not drink coffee or eat b'fast. ?  ? Look up Cequr Simplicity mechanical insulin pump. ? ?Please also continue: ?- Levothyroxine 175 mcg daily. ?  ?Take the thyroid hormone every day with water, at least 30 minutes before breakfast, separated by at least 4 hours from: ?- acid reflux medications ?- calcium ?- iron ?- multivitamins ?  ?Please return in 3 months. ?

## 2021-06-15 ENCOUNTER — Telehealth (HOSPITAL_COMMUNITY): Payer: Self-pay | Admitting: *Deleted

## 2021-06-15 DIAGNOSIS — I272 Pulmonary hypertension, unspecified: Secondary | ICD-10-CM

## 2021-06-15 DIAGNOSIS — I5032 Chronic diastolic (congestive) heart failure: Secondary | ICD-10-CM

## 2021-06-15 NOTE — Telephone Encounter (Signed)
Pt called stating her bp is high even with losartan. Bp yesterday 185/105 bp today 169/97. Pt denies any symptoms such as headache and chest pain. Pt asked if her bp meds could be increased.  ? ?Routed to [employer]  ?

## 2021-06-18 ENCOUNTER — Other Ambulatory Visit (HOSPITAL_COMMUNITY): Payer: Self-pay

## 2021-06-18 ENCOUNTER — Other Ambulatory Visit (HOSPITAL_COMMUNITY): Payer: Self-pay | Admitting: *Deleted

## 2021-06-18 MED ORDER — ENTRESTO 49-51 MG PO TABS: 1.0000 | ORAL_TABLET | Freq: Two times a day (BID) | ORAL | 3 refills | Status: DC

## 2021-06-18 NOTE — Telephone Encounter (Signed)
Left detailed vm for pt and requested a return call. Entresto sent.  ?

## 2021-06-18 NOTE — Telephone Encounter (Signed)
Patient's insurance only allows 30 day billing. Copay, $50.00. Can use co-pay card to bring it down to $10 monthly. Copay card information, BIN [id number], PCN [place], [place] [id number], ID [id number]. Patient should also receive and email with the information as well.

## 2021-07-05 ENCOUNTER — Encounter: Payer: Self-pay | Admitting: Gastroenterology

## 2021-07-12 ENCOUNTER — Telehealth: Payer: Self-pay | Admitting: Gastroenterology

## 2021-07-12 NOTE — Telephone Encounter (Signed)
Hey [name],  ? ?Patient called in to cancel procedure 07-27-22 due to death in the family. She rescheduled for 6/21. ? ?Thank you ?

## 2021-07-13 ENCOUNTER — Encounter: Payer: No Typology Code available for payment source | Admitting: Gastroenterology

## 2021-07-13 NOTE — Addendum Note (Signed)
Addended by: Kerry Dory on: 07/13/2021 03:53 PM ? ? Modules accepted: Orders ? ?

## 2021-07-13 NOTE — Telephone Encounter (Signed)
Patient remembers being told once and if she starts entresto will need fu labs. Reports she has been taking entresto ~2 weeks ?Follow lab appt made 4/28 @ 3  ?

## 2021-07-20 ENCOUNTER — Ambulatory Visit (HOSPITAL_COMMUNITY)
Admission: RE | Admit: 2021-07-20 | Discharge: 2021-07-20 | Disposition: A | Discharge status: Home / Self Care | Payer: No Typology Code available for payment source | Source: Ambulatory Visit | Attending: Internal Medicine | Admitting: Internal Medicine

## 2021-07-20 DIAGNOSIS — I5032 Chronic diastolic (congestive) heart failure: Secondary | ICD-10-CM | POA: Insufficient documentation

## 2021-07-20 LAB — BASIC METABOLIC PANEL
Anion gap: 7 (ref 5–15)
BUN: 15 mg/dL (ref 6–20)
CO2: 29 mmol/L (ref 22–32)
Calcium: 9.5 mg/dL (ref 8.9–10.3)
Chloride: 103 mmol/L (ref 98–111)
Creatinine, Ser: 0.68 mg/dL (ref 0.44–1.00)
GFR, Estimated: >60 mL/min (ref >60)
Glucose, Bld: 275 mg/dL — ABNORMAL HIGH (ref 70–99)
Potassium: 4 mmol/L (ref 3.5–5.1)
Sodium: 139 mmol/L (ref 135–145)

## 2021-08-08 LAB — HM DIABETES EYE EXAM

## 2021-08-23 ENCOUNTER — Encounter: Payer: Self-pay | Admitting: Family Medicine

## 2021-09-12 ENCOUNTER — Encounter: Payer: No Typology Code available for payment source | Admitting: Gastroenterology

## 2021-09-13 ENCOUNTER — Other Ambulatory Visit: Payer: Self-pay | Admitting: Family Medicine

## 2021-09-13 NOTE — Telephone Encounter (Signed)
Last filled 08/13/2021 Last OV 11/30/20

## 2021-10-16 ENCOUNTER — Ambulatory Visit: Payer: No Typology Code available for payment source | Admitting: Internal Medicine

## 2021-10-16 NOTE — Progress Notes (Deleted)
Patient ID: ADALINA DOPSON, female   DOB: 1965-11-17, 56 y.o.   MRN: [id number]  HPI: LAQUITHA HESLIN is a 56 y.o.-year-old female, returning for f/u for DM2, dx 2008, insulin-dependent since ~2011, uncontrolled, with complications (peripheral neuropathy, dCHF), h/o papillary thyroid cancer and iatrogenic hypothyroidism.  Last visit 4 months ago.  Interim history: At this visit, she has no increased urination, blurry vision, nausea, chest pain.   DM2: Reviewed HbA1c levels: Lab Results  Component Value Date   HGBA1C 9.1 (A) 06/11/2021   HGBA1C 10.8 (A) 12/09/2019   HGBA1C 10.2 (A) 11/27/2018    She is on: - Toujeo 45 x 2 injections at night >> off since 02/2021 >> restarted 03/2021 - Metformin 1000 mg 2x a day  >> off since 02/2021  >> restarted 03/2021 - Invokana 300 mg in a.m. >> Jardiance 25 mg before b'fast - Humalog  >> stopped 2/2 nausea, diarrhea (?) >>  Restarted 03/2021: 15-20 units 3x a day 15 min before meals You may need 5-10 units of Humalog around 8 am even if you do not drink coffee or eat b'fast. - - started 08/2017 >> restarted >> stopped 2/2 nausea in 2020 She was also on Glipizide but developed hypoglycemia >> loss of consciousness. She was also on Amaryl >> taken off.   She checks sugars >4x a day with her Libre CGM:  Previously:   Previously: - am: 170-220 >> 140-160 >> 132-181 >> 175-269 >> 300-400 - before lunch: 116-163 >> n/c >> 115 >> 310-380 >> n/c - before dinner: 113 >> n/c >> 106, 156  >> 214-349 >> 400s-500 - bedtime: 155-178, 211 >> n/c >> 160s >> 153, 159 >> n/c    Pt's meals are: - Breakfast: skips - 10 o'clock: banana, apple, nectarine - Lunch: fast food - Dinner: goes out to eat around 8 o'clock: chicken sandwich or a wrap - Snacks: 0, no concentrated sweets  -No CKD, last BUN/creatinine:  Lab Results  Component Value Date   BUN 15 07/20/2021   CREATININE 0.68 07/20/2021  Not on ACE inhibitor/ARB  -+ HL; last set of  lipids: Lab Results  Component Value Date   CHOL 215 (H) 11/24/2020   HDL 46 (L) 11/24/2020   LDLCALC 127 (H) 11/24/2020   LDLDIRECT 140.0 09/29/2019   TRIG 274 (H) 11/24/2020   CHOLHDL 4.7 11/24/2020  On Crestor 10.  - last eye exam was in 01/2021: No DR reportedly, + glaucoma. [facility].Had intraocular lens transplant B in 2022.  - she has Numbness and tingling in her feet.  Up-to-date with foot exam in 11/2020.  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  Not have to have RAI treatment  Neck U/S (11/24/2014): Post thyroidectomy.  No suspicious lymphadenopathy or soft tissue.  Neck U/S (10/07/2017): Post total thyroidectomy without evidence residual, locally recurrent or metastatic disease.  Pt restarted levothyroxine 175 mcg daily, taken: - now in a pillbox - in am - fasting - at least 30 min from b'fast - no calcium - + iron at lunchtime and at night - no multivitamins - + PPIs at lunchtime - off Biotin  Reviewed her TFTs: Lab Results  Component Value Date   TSH 0.81 06/11/2021   TSH 4.49 11/24/2020   TSH 23.95 (H) 09/29/2019   TSH 20.95 (H) 08/14/2018   TSH  2.011 12/15/2017   TSH 6.32 (H) 08/01/2017   TSH 2.83 05/23/2017   TSH 14.73 (H) 07/26/2016   TSH 9.17 (A) 04/02/2016   TSH 11.83 (H) 06/16/2015   Pt denies: - feeling nodules in neck - hoarseness - choking But has chronic dysphagia.  ROS: + see HPI Musculoskeletal: + muscle aches/+ joint aches Neurological: no tremors/+ numbness/+ tingling/no dizziness  I reviewed pt's medications, allergies, PMH, social hx, family hx, and changes were documented in the history of present illness. Otherwise, unchanged from my initial visit note.  Past Medical History:  Diagnosis Date   Allergy    SEASONAL   Anemia     iron deficiency   Anxiety    Arthritis    Asthma    a. 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   Cellulitis and abscess of leg 02/2014   CHF (congestive heart failure) ([place])    Chronic kidney disease    /36   Complication of anesthesia    DIFFICULT WAKING- 2011   Depression    a. Stress reaction 08/2011 in multiple social stressors.   Diabetes mellitus    Dysrhythmia    Gastritis    GERD (gastroesophageal reflux disease)    Heart murmur    Hemorrhoid    Hyperlipidemia    Hypertension    Hyperthyroidism    Insomnia    Morbid obesity (HCC)    OSA (obstructive sleep apnea) 01/17/2012   Papillary thyroid carcinoma (HCC)    PCOS (polycystic ovarian syndrome)    Sleep apnea    C PAP   Thyroid cancer ([place]) 2014   Vertigo    Past Surgical History:  Procedure Laterality Date   CATARACT EXTRACTION Left 04/13/2020   CATARACT EXTRACTION Right 06/01/2020   CHOLECYSTECTOMY     HEMORRHOID SURGERY     INTRAUTERINE DEVICE INSERTION  03/25/2009   SHOULDER SURGERY  06/23/2009   THYROIDECTOMY  04/10/2012   Procedure: THYROIDECTOMY;  Surgeon: Jerrell Belfast, MD;  Location: [facility];  Service: ENT;  Laterality: N/A;  Total Thyroidectomy   TONSILLECTOMY AND ADENOIDECTOMY  CHILD   TUBAL LIGATION     Social History   Socioeconomic History   Marital status: Married    Spouse name: Ranya Fiddler   Number of children: 2   Years of education: Some college   Highest education level: Not on file  Occupational History   Occupation: [employer] cabin [employer]  Tobacco Use   Smoking status: Former    Packs/day: 0.20    Years: 5.00    Total pack years: 1.00    Types: Cigarettes    Quit date: 03/25/2004    Years since quitting: 17.5   Smokeless tobacco: Never  Vaping Use   Vaping Use: Never used  Substance and Sexual Activity   Alcohol use: No    Alcohol/week: 0.0 standard drinks of alcohol   Drug use: No   Sexual activity: Yes  Other Topics  Concern   Not on file  Social History Narrative   Lives with son/husband   Caffeine use: daily   Right handed    Social Determinants of Health   Financial Resource Strain: Not on file  Food Insecurity: Not on file  Transportation Needs: Not on file  Physical Activity: Not on file  Stress: Not on file  Social Connections: Not on file  Intimate Partner Violence: Not on file   Current Outpatient Medications on File Prior to Visit  Medication Sig Dispense Refill  Albuterol Sulfate (PROAIR RESPICLICK) 993 (90 Base) MCG/ACT AEPB Inhale 2 puffs into the lungs every 6 (six) hours as needed for up to 6 doses. 1 each 1   buPROPion (WELLBUTRIN XL) 150 MG 24 hr tablet TAKE 1 TABLET BY MOUTH EVERY DAY 90 tablet 2   carvedilol (COREG) 6.25 MG tablet TAKE 1 TABLET BY MOUTH 2 TIMES DAILY WITH A MEAL. 180 tablet 3   cetirizine (ZYRTEC) 10 MG tablet TAKE 1 TABLET BY MOUTH EVERY DAY 90 tablet 2   cholestyramine (QUESTRAN) 4 g packet Take 1 packet (4 g total) by mouth daily. 30 each 6   Continuous Blood Gluc Sensor (FREESTYLE LIBRE 3 SENSOR) MISC 1 each by Does not apply route every 14 (fourteen) days. 6 each 3   empagliflozin (JARDIANCE) 25 MG TABS tablet Take 1 tablet (25 mg total) by mouth daily before breakfast. 90 tablet 3   ferrous sulfate 325 (65 FE) MG tablet Take 325 mg by mouth 2 (two) times daily.       fluticasone (FLONASE) 50 MCG/ACT nasal spray Place 2 sprays into both nostrils daily as needed for allergies. 16 g 11   glucose blood (ONE TOUCH ULTRA TEST) test strip Use 2x a day 100 each 2   insulin glargine, 1 Unit Dial, (TOUJEO SOLOSTAR) 300 UNIT/ML Solostar Pen Inject 90 Units into the skin at bedtime. 18 mL 3   insulin lispro (HUMALOG KWIKPEN) 200 UNIT/ML KwikPen Inject 10-20 Units into the skin 3 (three) times daily before meals. 30 mL 3   Insulin Pen Needle 32G X 4 MM MISC Use 5x a day 400 each 3   levothyroxine (SYNTHROID) 175 MCG tablet Take 1 tablet daily in am, fasting 90 tablet 3    metFORMIN (GLUCOPHAGE) 1000 MG tablet Take 1 tablet (1,000 mg total) by mouth 2 (two) times daily with a meal. 180 tablet 3   Na Sulfate-K Sulfate-Mg Sulf (SUPREP BOWEL PREP KIT) 17.5-3.13-1.6 GM/177ML SOLN Take 1 kit by mouth as directed. 324 mL 0   pantoprazole (PROTONIX) 40 MG tablet Take 1 tablet (40 mg total) by mouth daily. 90 tablet 2   potassium chloride (KLOR-CON) 20 MEQ packet Take 20 mEq by mouth as needed.     rosuvastatin (CRESTOR) 10 MG tablet Take 1 tablet (10 mg total) by mouth daily. 90 tablet 3   sacubitril-valsartan (ENTRESTO) 49-51 MG Take 1 tablet by mouth 2 (two) times daily. 60 tablet 3   sertraline (ZOLOFT) 100 MG tablet TAKE 1.5 TABLETS BY MOUTH EVERY DAY 135 tablet 2   spironolactone (ALDACTONE) 25 MG tablet Take 1 tablet (25 mg total) by mouth daily. 90 tablet 3   torsemide (DEMADEX) 20 MG tablet Take 1 tablet (20 mg total) by mouth daily as needed. 30 tablet 6   TRAVATAN Z 0.004 % SOLN ophthalmic solution Place 1 drop into both eyes at bedtime.      zolpidem (AMBIEN) 10 MG tablet TAKE 1/2 TO 1 TABLET BY MOUTH AT BEDTIME AS NEEDED 30 tablet 3   No current facility-administered medications on file prior to visit.   Allergies  Allergen Reactions   Tape Rash    Plastic tape   Buspirone Hcl Nausea Only   Glipizide     REACTION: sleepy, low sugar   Ivp Dye [Iodinated Contrast Media]     Shut kidneys down   Oxycodone-Acetaminophen Itching   Family History  Problem Relation Age of Onset   Arthritis Mother        osteoarthritis  Asthma Mother    Hypertension Mother    Multiple sclerosis Mother    Cancer Father        colon cancer   Arthritis Father        osteoarthritis   Colon cancer Father    Colon polyps Father    Transient ischemic attack Father    COPD Father    Heart disease Maternal Grandfather    Ovarian cancer Maternal Aunt    Diabetes Paternal Aunt    Diabetes Paternal Uncle    Stomach cancer Neg Hx    Esophageal cancer Neg Hx     PE: There were no vitals taken for this visit. Wt Readings from Last 3 Encounters:  06/11/21 268 lb 9.6 oz (121.8 kg)  06/05/21 263 lb 3.2 oz (119.4 kg)  04/26/21 255 lb (115.7 kg)   Constitutional: overweight, in NAD Eyes: no exophthalmos ENT: moist mucous membranes, no masses palpated in neck, no cervical lymphadenopathy Cardiovascular: RRR, No MRG Respiratory: CTA B Musculoskeletal: no deformities Skin: moist, warm, no rashes Neurological: no tremor with outstretched hands  ASSESSMENT: 1. DM2, insulin-dependent, uncontrolled, without long-term complications but with hyperglycemia  2. Iatrogenic Hypothyroidism - h/o med noncompliance  3. [place] -No RAI treatment was necessary  PLAN:  1. Patient with history of uncontrolled type 2 diabetes, on basal/bolus insulin regimen, metformin, and SGLT2 inhibitor, with poor control especially after coming off all of her diabetic medications for approximately 1.5 months before last visit in 03/2021. -She has an intolerance to Ozempic due to GI symptoms.  Sugars increased to 300-400 after stopping Ozempic so we started Humalog. -I advised her repeatedly in the past that if she did not come for follow-up appointments and not taking her medications as prescribed, I would not be able to help her.   -At last visit, reviewing her CGM trends, there was a tremendous improvement in her blood sugars since the previous visit, with the majority of the blood sugars at goal.  Her sugars are still slightly higher after breakfast but we did not have to change the regimen.  I did discuss with her about possibly looking into the CeQur simplicity mechanical pump to help with the injection burden. CGM interpretation: -At today's visit, we reviewed her CGM downloads: It appears that *** of values are in target range (goal >70%), while *** are higher than 180 (goal <25%), and *** are lower than 70 (goal <4%).  The calculated average blood sugar is  ***.  The projected HbA1c for the next 3 months (GMI) is ***. -Reviewing the CGM trends, ***  - I suggested to:  Patient Instructions  Please continue: - Metformin 1000 mg 2x a day - Jardiance 25 mg before b'fast - Toujeo 45 x 2 injections at night - Humalog 15-20 units 3x a day 15 min before meals You may need 5-10 units of Humalog around 8 am even if you do not drink coffee or eat b'fast.    Look up Cequr Simplicity mechanical insulin pump.  Please also continue: - Levothyroxine 175 mcg daily.   Take the thyroid hormone every day with water, at least 30 minutes before breakfast, separated by at least 4 hours from: - acid reflux medications - calcium - iron - multivitamins   Please return in 3-4 months.  - we checked her HbA1c: 7%  - advised to check sugars at different times of the day - 4x a day, rotating check times - advised for yearly eye exams >> she is UTD -  return to clinic in 3-4 months  2. Iatrogenic Hypothyroidism -Uncontrolled due to history of incomplete compliance with her levothyroxine in the past.  At last visit she was off the medication completely for 1.5 months!  We discussed that this is unacceptable and advised her about the risks of hypothyroidism. -Before last visit, she got a pillbox and she is taking this in the morning along with Jardiance.  Previously she was taking it when getting to work. - latest thyroid labs reviewed with pt. >> normal: Lab Results  Component Value Date   TSH 0.81 06/11/2021  - she continues on LT4 175 mcg daily - pt feels good on this dose. - we discussed about taking the thyroid hormone every day, with water, >30 minutes before breakfast, separated by >4 hours from acid reflux medications, calcium, iron, multivitamins. Pt. is taking it correctly.  3. Follicular PTC -Reviewed neck ultrasound report from 11/2014 in 09/2017: No recurrence -She has chronic dysphagia, but no neck compression symptoms  Philemon Kingdom, MD  PhD [facility]

## 2021-11-08 ENCOUNTER — Other Ambulatory Visit: Payer: Self-pay | Admitting: Family Medicine

## 2021-11-08 NOTE — Telephone Encounter (Signed)
Last filled on 08/13/21 Last ov 11/24/20  Called and lvm for pt to make an appt for sept.

## 2021-11-19 ENCOUNTER — Other Ambulatory Visit (HOSPITAL_COMMUNITY): Payer: Self-pay | Admitting: Internal Medicine

## 2021-12-06 ENCOUNTER — Other Ambulatory Visit: Payer: Self-pay | Admitting: Family Medicine

## 2021-12-07 NOTE — Telephone Encounter (Signed)
Patient scheduled.

## 2021-12-07 NOTE — Telephone Encounter (Signed)
Pt's due for her CPE (labs prior) please call and schedule appt then route back to me once appt has been made

## 2021-12-16 ENCOUNTER — Other Ambulatory Visit: Payer: Self-pay | Admitting: Family Medicine

## 2022-01-04 ENCOUNTER — Encounter: Payer: Self-pay | Admitting: Family Medicine

## 2022-01-13 ENCOUNTER — Telehealth: Payer: Self-pay | Admitting: Family Medicine

## 2022-01-13 DIAGNOSIS — Z79899 Other long term (current) drug therapy: Secondary | ICD-10-CM

## 2022-01-13 DIAGNOSIS — E1169 Type 2 diabetes mellitus with other specified complication: Secondary | ICD-10-CM

## 2022-01-13 DIAGNOSIS — E559 Vitamin D deficiency, unspecified: Secondary | ICD-10-CM

## 2022-01-13 DIAGNOSIS — I10 Essential (primary) hypertension: Secondary | ICD-10-CM

## 2022-01-13 NOTE — Telephone Encounter (Signed)
-----   Message from Ellamae Sia sent at 01/03/2022 10:49 AM EDT ----- Regarding: Lab orders for Monday, 10.23.23 Patient is scheduled for CPX labs, please order future labs, Thanks , Karna Christmas

## 2022-01-15 ENCOUNTER — Other Ambulatory Visit (INDEPENDENT_AMBULATORY_CARE_PROVIDER_SITE_OTHER): Payer: No Typology Code available for payment source

## 2022-01-15 DIAGNOSIS — E785 Hyperlipidemia, unspecified: Secondary | ICD-10-CM

## 2022-01-15 DIAGNOSIS — E559 Vitamin D deficiency, unspecified: Secondary | ICD-10-CM

## 2022-01-15 DIAGNOSIS — I10 Essential (primary) hypertension: Secondary | ICD-10-CM | POA: Diagnosis not present

## 2022-01-15 DIAGNOSIS — E1169 Type 2 diabetes mellitus with other specified complication: Secondary | ICD-10-CM | POA: Diagnosis not present

## 2022-01-15 DIAGNOSIS — Z79899 Other long term (current) drug therapy: Secondary | ICD-10-CM

## 2022-01-15 LAB — LIPID PANEL
Cholesterol: 168 mg/dL (ref 0–200)
HDL: 38.4 mg/dL — ABNORMAL LOW (ref >39.00)
NonHDL: 129.7
Total CHOL/HDL Ratio: 4
Triglycerides: 341 mg/dL — ABNORMAL HIGH (ref 0.0–149.0)
VLDL: 68.2 mg/dL — ABNORMAL HIGH (ref 0.0–40.0)

## 2022-01-15 LAB — COMPREHENSIVE METABOLIC PANEL
ALT: 14 U/L (ref 0–35)
AST: 12 U/L (ref 0–37)
Albumin: 4.1 g/dL (ref 3.5–5.2)
Alkaline Phosphatase: 77 U/L (ref 39–117)
BUN: 17 mg/dL (ref 6–23)
CO2: 30 mEq/L (ref 19–32)
Calcium: 8.6 mg/dL (ref 8.4–10.5)
Chloride: 101 mEq/L (ref 96–112)
Creatinine, Ser: 0.68 mg/dL (ref 0.40–1.20)
GFR: 97.5 mL/min (ref >60.00)
Glucose, Bld: 283 mg/dL — ABNORMAL HIGH (ref 70–99)
Potassium: 3.3 mEq/L — ABNORMAL LOW (ref 3.5–5.1)
Sodium: 139 mEq/L (ref 135–145)
Total Bilirubin: 0.3 mg/dL (ref 0.2–1.2)
Total Protein: 6.3 g/dL (ref 6.0–8.3)

## 2022-01-15 LAB — CBC WITH DIFFERENTIAL/PLATELET
Basophils Absolute: 0 10*3/uL (ref 0.0–0.1)
Basophils Relative: 0.7 % (ref 0.0–3.0)
Eosinophils Absolute: 0.2 10*3/uL (ref 0.0–0.7)
Eosinophils Relative: 2.3 % (ref 0.0–5.0)
HCT: 39.7 % (ref 36.0–46.0)
Hemoglobin: 13 g/dL (ref 12.0–15.0)
Lymphocytes Relative: 20.8 % (ref 12.0–46.0)
Lymphs Abs: 1.5 10*3/uL (ref 0.7–4.0)
MCHC: 32.7 g/dL (ref 30.0–36.0)
MCV: 90.1 fl (ref 78.0–100.0)
Monocytes Absolute: 0.4 10*3/uL (ref 0.1–1.0)
Monocytes Relative: 6.3 % (ref 3.0–12.0)
Neutro Abs: 4.9 10*3/uL (ref 1.4–7.7)
Neutrophils Relative %: 69.9 % (ref 43.0–77.0)
Platelets: 217 10*3/uL (ref 150.0–400.0)
RBC: 4.4 Mil/uL (ref 3.87–5.11)
RDW: 14.5 % (ref 11.5–15.5)
WBC: 7 10*3/uL (ref 4.0–10.5)

## 2022-01-15 LAB — VITAMIN D 25 HYDROXY (VIT D DEFICIENCY, FRACTURES): VITD: 14.75 ng/mL — ABNORMAL LOW (ref 30.00–100.00)

## 2022-01-15 LAB — LDL CHOLESTEROL, DIRECT: Direct LDL: 97 mg/dL

## 2022-01-15 LAB — VITAMIN B12: Vitamin B-12: 170 pg/mL — ABNORMAL LOW (ref 211–911)

## 2022-01-15 LAB — TSH: TSH: 4.48 u[IU]/mL (ref 0.35–5.50)

## 2022-01-16 ENCOUNTER — Other Ambulatory Visit (HOSPITAL_COMMUNITY): Payer: Self-pay | Admitting: Family Medicine

## 2022-01-16 ENCOUNTER — Other Ambulatory Visit: Payer: Self-pay | Admitting: Family Medicine

## 2022-01-17 NOTE — Telephone Encounter (Signed)
Name of Medication: Ambien Name of Pharmacy: [facility] [place] or Written Date and Quantity: 09/13/21 #30 tab/ 3 refills Last Office Visit and Type: CPE on 11/24/20 Next Office Visit and Type: CPE on 01/22/22

## 2022-01-22 ENCOUNTER — Encounter: Payer: No Typology Code available for payment source | Admitting: Family Medicine

## 2022-01-22 ENCOUNTER — Encounter: Payer: Self-pay | Admitting: Family Medicine

## 2022-01-22 ENCOUNTER — Ambulatory Visit (INDEPENDENT_AMBULATORY_CARE_PROVIDER_SITE_OTHER): Payer: No Typology Code available for payment source | Admitting: Family Medicine

## 2022-01-22 VITALS — BP 136/80 | HR 63 | Temp 97.5°F | Ht 65.0 in | Wt 262.1 lb

## 2022-01-22 DIAGNOSIS — E890 Postprocedural hypothyroidism: Secondary | ICD-10-CM

## 2022-01-22 DIAGNOSIS — I10 Essential (primary) hypertension: Secondary | ICD-10-CM

## 2022-01-22 DIAGNOSIS — E538 Deficiency of other specified B group vitamins: Secondary | ICD-10-CM | POA: Insufficient documentation

## 2022-01-22 DIAGNOSIS — Z0000 Encounter for general adult medical examination without abnormal findings: Secondary | ICD-10-CM | POA: Diagnosis not present

## 2022-01-22 DIAGNOSIS — F418 Other specified anxiety disorders: Secondary | ICD-10-CM

## 2022-01-22 DIAGNOSIS — E785 Hyperlipidemia, unspecified: Secondary | ICD-10-CM

## 2022-01-22 DIAGNOSIS — E249 Cushing's syndrome, unspecified: Secondary | ICD-10-CM

## 2022-01-22 DIAGNOSIS — I5032 Chronic diastolic (congestive) heart failure: Secondary | ICD-10-CM

## 2022-01-22 DIAGNOSIS — E1169 Type 2 diabetes mellitus with other specified complication: Secondary | ICD-10-CM

## 2022-01-22 DIAGNOSIS — K219 Gastro-esophageal reflux disease without esophagitis: Secondary | ICD-10-CM

## 2022-01-22 DIAGNOSIS — Z79899 Other long term (current) drug therapy: Secondary | ICD-10-CM

## 2022-01-22 DIAGNOSIS — Z1211 Encounter for screening for malignant neoplasm of colon: Secondary | ICD-10-CM

## 2022-01-22 DIAGNOSIS — E559 Vitamin D deficiency, unspecified: Secondary | ICD-10-CM

## 2022-01-22 MED ORDER — ROSUVASTATIN CALCIUM 20 MG PO TABS: 20.0000 mg | ORAL_TABLET | Freq: Every day | ORAL | 3 refills | Status: DC

## 2022-01-22 MED ORDER — CYANOCOBALAMIN 1000 MCG/ML IJ SOLN
1000.0000 ug | Freq: Once | INTRAMUSCULAR | Status: AC
  Administered 2022-01-22: 1000 ug via INTRAMUSCULAR

## 2022-01-22 NOTE — Assessment & Plan Note (Signed)
tx by cardiology  entresto Torsemide prn Aldactone carvedilol

## 2022-01-22 NOTE — Assessment & Plan Note (Signed)
Is unable to go without protonix /ppi  B12 and D are low  Enc wt loss and watch diet

## 2022-01-22 NOTE — Assessment & Plan Note (Signed)
Reviewed health habits including diet and exercise and skin cancer prevention Reviewed appropriate screening tests for age  Also reviewed health mt list, fam hx and immunization status , as well as social and family history   See HPI Labs reviewed  Plans to check on coverage of shingrix Under gyn care for mammogram and pap Colonoscopy overdue-pt plans to call and re schedule Eye exam utd

## 2022-01-22 NOTE — Assessment & Plan Note (Signed)
bp in fair control at this time  BP Readings from Last 1 Encounters:  01/22/22 136/80   No changes needed Most recent labs reviewed  Disc lifstyle change with low sodium diet and exercise  Aldactone 25 mg daily  Carvedilol 6.25 mg bid entresto bid

## 2022-01-22 NOTE — Assessment & Plan Note (Signed)
D level 14.75 Disc imp to bone and overall health Recommend 2000 iu vit D3 daily

## 2022-01-22 NOTE — Assessment & Plan Note (Signed)
Both B12 and D are low  Will supplement

## 2022-01-22 NOTE — Assessment & Plan Note (Signed)
Stable with wellbutrin and zoloft  Reviewed stressors/ coping techniques/symptoms/ support sources/ tx options and side effects in detail today Stress is high

## 2022-01-22 NOTE — Progress Notes (Signed)
Subjective:    Patient ID: [name], female    DOB: [birth year], 56 y.o.   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 and has dementia and MS   Father had [place] kids New puppy - a lot of work and keeps her very active  Under stress   Is tired  H/o OSA  Even with ambien cannot always sleep well  Is using cpap   Getting out of breath easily  Worse short term memory  Poor balance   Some issues with low back pain  Makes the front of her legs ache  No time to exercise right now  Has lost some muscle   Immunization History  Administered Date(s) Administered   Influenza Split 01/17/2012   Influenza Whole 12/23/2005, 12/23/2008, 01/03/2010   Influenza,inj,Quad PF,6+ Mos 12/27/2013, 01/23/2018, 01/19/2020, 11/24/2020   Influenza-Unspecified 12/24/2012, 02/01/2015, 12/28/2015, 01/03/2017, 12/11/2018, 12/24/2020, 01/04/2022   PFIZER Comirnaty(Gray Top)Covid-19 Tri-Sucrose Vaccine 09/01/2020   PFIZER(Purple Top)SARS-COV-2 Vaccination 08/05/2019, 08/26/2019, 03/03/2020   Pneumococcal Polysaccharide-23 01/31/2012, 08/08/2017   Tdap 02/24/2012   Health Maintenance Due  Topic Date Due   Zoster Vaccines- Shingrix (1 of 2) Never done   Diabetic kidney evaluation - Urine ACR  06/18/2016   COLONOSCOPY (Pts 45-18yr Insurance coverage will need to be confirmed)  08/31/2020   COVID-19 Vaccine (5 - Pfizer risk series) 10/27/2020   MAMMOGRAM  02/08/2021   FOOT EXAM  11/24/2021   HEMOGLOBIN A1C  12/12/2021    Shingrix:  interested   Colonoscopy 08/2015 with 5 y recall  She scheduled it but had to cancel when dad got sick   Mammogram 01/2020- per pt had last year at gyn Self breast exam: no lumps  Pap 01/2020  at gyn  nl with neg HPV Goes yearly to gyn- last year dec   Oph exam 07/2021 Has another visit coming up   HTN bp is stable today  No cp or palpitations or headaches or edema  No side effects to medicines  BP Readings from Last 3 Encounters:  01/22/22 136/80  06/11/21 140/82  06/05/21 (!) 152/88     bp is stable today  No cp or palpitations or headaches or edema  No side effects to medicines  BP Readings from Last 3 Encounters:  01/22/22 136/80  06/11/21 140/82  06/05/21 (!) 152/88     Aldactone 25 mg daily  Carvedilol 6.25 mg bid  Entresto 49-51 bid  Torsemide 20 mg prn    CHF, sees cardiology   H/o Cushing's dz   GERD Protonix  Lab Results  Component Value Date   VITAMINB12 170 (L) 01/15/2022   D level of 14.75  Was on high dose from neuro for a while    Iron def Lab Results  Component Value Date   WBC 7.0 01/15/2022   HGB 13.0 01/15/2022   HCT 39.7 01/15/2022   MCV 90.1 01/15/2022   PLT 217.0 01/15/2022   Lab Results  Component Value Date   IRON 77 11/24/2020   TIBC 450 01/26/2012   FERRITIN 16.2 07/26/2016   Taking iron    DM2 Lab Results  Component Value Date   HGBA1C 9.1 (A)  06/11/2021  Sees endocrinology  Doing ok overall  Her HSA ran out so she was unable to get a medication  Has appt soon  Diet is not optimal Is trying to eat more green stuff    Hypothyroidism  Pt has no clinical changes No change in energy level/ hair or skin/ edema and no tremor Lab Results  Component Value Date   TSH 4.48 01/15/2022   Sees endo  Levothyroxine 175 mcg daily     Hyperlipidemia Lab Results  Component Value Date   CHOL 168 01/15/2022   CHOL 215 (H) 11/24/2020   CHOL 241 (H) 09/29/2019   Lab Results  Component Value Date   HDL 38.40 (L) 01/15/2022   HDL 46 (L) 11/24/2020   HDL 40.50 09/29/2019   Lab Results  Component Value Date   LDLCALC 127 (H) 11/24/2020   LDLCALC 71 06/16/2015   LDLCALC 91 12/20/2013   Lab Results  Component  Value Date   TRIG 341.0 (H) 01/15/2022   TRIG 274 (H) 11/24/2020   TRIG (H) 09/29/2019    557.0 Triglyceride is over 400; calculations on Lipids are invalid.   Lab Results  Component Value Date   CHOLHDL 4 01/15/2022   CHOLHDL 4.7 11/24/2020   CHOLHDL 6 09/29/2019   Lab Results  Component Value Date   LDLDIRECT 97.0 01/15/2022   LDLDIRECT 140.0 09/29/2019   LDLDIRECT 100.0 08/14/2018   Crestor 10 mg daily  LDL 97   Lab Results  Component Value Date   ALT 14 01/15/2022   AST 12 01/15/2022   ALKPHOS 77 01/15/2022   BILITOT 0.3 01/15/2022   Patient Active Problem List   Diagnosis Date Noted   Vitamin B12 deficiency 01/22/2022   Current use of proton pump inhibitor 11/24/2020   Colon cancer screening 11/24/2020   Vitamin D deficiency 09/27/2019   Lumbar radiculopathy 12/16/2018   Poorly controlled type 2 diabetes mellitus with peripheral neuropathy ([place]) 12/16/2018   Bilateral carpal tunnel syndrome 11/24/2018   Family history of MS (multiple sclerosis) 09/29/2018   Routine general medical examination at a health care facility 12/19/2013   Palpitations 02/03/2013   Postsurgical hypothyroidism 04/28/2012   History of thyroid cancer 04/27/2012   Cushing's syndrome ([place]) 04/27/2012   Chronic diastolic heart failure ([place]) 90/38/3338   Diastolic CHF ([place]) 32/91/9166   OSA (obstructive sleep apnea) just got cpap at home this past week 01/17/12 01/17/2012   Chronic allergic rhinitis 10/26/2010   Insomnia 10/26/2010   OBESITY, MORBID 04/27/2010   HEMORRHOIDS, INTERNAL, WITH BLEEDING 08/17/2009   GERD 04/27/2008   Essential hypertension 11/03/2007   Hyperlipidemia associated with type 2 diabetes mellitus ([place]) 05/14/2007   OTHER ORGANIC SLEEP DISORDERS 03/09/2007   Uncontrolled type 2 diabetes mellitus with peripheral neuropathy 09/11/2006   POLYCYSTIC OVARIES 09/11/2006   Iron deficiency anemia 09/11/2006   Depression with anxiety 09/11/2006   EXTERNAL HEMORRHOIDS  09/11/2006   Asthma 09/11/2006   Past Medical History:  Diagnosis Date   Allergy    SEASONAL   Anemia    iron deficiency   Anxiety    Arthritis    Asthma    a. 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   Cellulitis and abscess of leg 02/2014   CHF (congestive heart failure) ([place])    Chronic kidney disease    /06   Complication of anesthesia    DIFFICULT WAKING- 2011   Depression    a. Stress reaction 08/2011 in multiple  social stressors.   Diabetes mellitus    Dysrhythmia    Gastritis    GERD (gastroesophageal reflux disease)    Heart murmur    Hemorrhoid    Hyperlipidemia    Hypertension    Hyperthyroidism    Insomnia    Morbid obesity (HCC)    OSA (obstructive sleep apnea) 01/17/2012   Papillary thyroid carcinoma (HCC)    PCOS (polycystic ovarian syndrome)    Sleep apnea    C PAP   Thyroid cancer ([place]) 2014   Vertigo    Past Surgical History:  Procedure Laterality Date   CATARACT EXTRACTION Left 04/13/2020   CATARACT EXTRACTION Right 06/01/2020   CHOLECYSTECTOMY     HEMORRHOID SURGERY     INTRAUTERINE DEVICE INSERTION  03/25/2009   SHOULDER SURGERY  06/23/2009   THYROIDECTOMY  04/10/2012   Procedure: THYROIDECTOMY;  Surgeon: Jerrell Belfast, MD;  Location: [facility];  Service: ENT;  Laterality: N/A;  Total Thyroidectomy   TONSILLECTOMY AND ADENOIDECTOMY  CHILD   TUBAL LIGATION     Social History   Tobacco Use   Smoking status: Former    Packs/day: 0.20    Years: 5.00    Total pack years: 1.00    Types: Cigarettes    Quit date: 03/25/2004    Years since quitting: 17.8   Smokeless tobacco: Never  Vaping Use   Vaping Use: Never used  Substance Use Topics   Alcohol use: No    Alcohol/week: 0.0 standard drinks of alcohol   Drug use: No   Family History  Problem Relation Age of Onset   Arthritis Mother        osteoarthritis   Asthma Mother    Hypertension Mother    Multiple sclerosis Mother    Cancer Father         colon cancer   Arthritis Father        osteoarthritis   Colon cancer Father    Colon polyps Father    Transient ischemic attack Father    COPD Father    Heart disease Maternal Grandfather    Ovarian cancer Maternal Aunt    Diabetes Paternal Aunt    Diabetes Paternal Uncle    Stomach cancer Neg Hx    Esophageal cancer Neg Hx    Allergies  Allergen Reactions   Tape Rash    Plastic tape   Buspirone Hcl Nausea Only   Glipizide     REACTION: sleepy, low sugar   Ivp Dye [Iodinated Contrast Media]     Shut kidneys down   Oxycodone-Acetaminophen Itching   Current Outpatient Medications on File Prior to Visit  Medication Sig Dispense Refill   Albuterol Sulfate (PROAIR RESPICLICK) 536 (90 Base) MCG/ACT AEPB Inhale 2 puffs into the lungs every 6 (six) hours as needed for up to 6 doses. 1 each 1   buPROPion (WELLBUTRIN XL) 150 MG 24 hr tablet TAKE 1 TABLET BY MOUTH EVERY DAY 90 tablet 0   carvedilol (COREG) 6.25 MG tablet TAKE 1 TABLET BY MOUTH 2 TIMES DAILY WITH A MEAL. 180 tablet 3   cetirizine (ZYRTEC) 10 MG tablet TAKE 1 TABLET BY MOUTH EVERY DAY 90 tablet 0   cholecalciferol (VITAMIN D3) 25 MCG (1000 UNIT) tablet Take 2,000 Units by mouth daily.     cholestyramine (QUESTRAN) 4 g packet Take 1 packet (4 g total) by mouth daily. 30 each 6   Continuous Blood Gluc Sensor (FREESTYLE LIBRE 3 SENSOR) MISC 1 each by Does not apply route  every 14 (fourteen) days. 6 each 3   cyanocobalamin (VITAMIN B12) 1000 MCG tablet Take 1,000 mcg by mouth daily.     empagliflozin (JARDIANCE) 25 MG TABS tablet Take 1 tablet (25 mg total) by mouth daily before breakfast. 90 tablet 3   ENTRESTO 49-51 MG TAKE 1 TABLET BY MOUTH TWICE A DAY 60 tablet 3   ferrous sulfate 325 (65 FE) MG tablet Take 325 mg by mouth 2 (two) times daily.       fluticasone (FLONASE) 50 MCG/ACT nasal spray Place 2 sprays into both nostrils daily as needed for allergies. 16 g 11   glucose blood (ONE TOUCH ULTRA TEST) test strip Use 2x a  day 100 each 2   insulin glargine, 1 Unit Dial, (TOUJEO SOLOSTAR) 300 UNIT/ML Solostar Pen Inject 90 Units into the skin at bedtime. 18 mL 3   insulin lispro (HUMALOG KWIKPEN) 200 UNIT/ML KwikPen Inject 10-20 Units into the skin 3 (three) times daily before meals. 30 mL 3   Insulin Pen Needle 32G X 4 MM MISC Use 5x a day 400 each 3   levothyroxine (SYNTHROID) 175 MCG tablet Take 1 tablet daily in am, fasting 90 tablet 3   metFORMIN (GLUCOPHAGE) 1000 MG tablet Take 1 tablet (1,000 mg total) by mouth 2 (two) times daily with a meal. 180 tablet 3   Na Sulfate-K Sulfate-Mg Sulf (SUPREP BOWEL PREP KIT) 17.5-3.13-1.6 GM/177ML SOLN Take 1 kit by mouth as directed. 324 mL 0   pantoprazole (PROTONIX) 40 MG tablet TAKE 1 TABLET BY MOUTH EVERY DAY 90 tablet 0   potassium chloride (KLOR-CON) 20 MEQ packet Take 20 mEq by mouth as needed.     sertraline (ZOLOFT) 100 MG tablet TAKE 1 AND 1/2 TABLETS BY MOUTH EVERY DAY 135 tablet 1   spironolactone (ALDACTONE) 25 MG tablet TAKE 1 TABLET (25 MG TOTAL) BY MOUTH DAILY. 90 tablet 3   torsemide (DEMADEX) 20 MG tablet Take 1 tablet (20 mg total) by mouth daily as needed. 30 tablet 6   TRAVATAN Z 0.004 % SOLN ophthalmic solution Place 1 drop into both eyes at bedtime.      zolpidem (AMBIEN) 10 MG tablet TAKE 1/2 TO 1 TABLET BY MOUTH AT BEDTIME AS NEEDED 30 tablet 3   No current facility-administered medications on file prior to visit.      Review of Systems  Constitutional:  Positive for fatigue. Negative for activity change, appetite change, fever and unexpected weight change.  HENT:  Negative for congestion, ear pain, rhinorrhea, sinus pressure and sore throat.   Eyes:  Negative for pain, redness and visual disturbance.  Respiratory:  Positive for shortness of breath. Negative for cough and wheezing.   Cardiovascular:  Negative for chest pain and palpitations.  Gastrointestinal:  Negative for abdominal pain, blood in stool, constipation and diarrhea.   Endocrine: Negative for polydipsia and polyuria.  Genitourinary:  Negative for dysuria, frequency and urgency.  Musculoskeletal:  Negative for arthralgias, back pain and myalgias.  Skin:  Negative for pallor and rash.  Allergic/Immunologic: Negative for environmental allergies.  Neurological:  Negative for dizziness, syncope and headaches.  Hematological:  Negative for adenopathy. Does not bruise/bleed easily.  Psychiatric/Behavioral:  Positive for decreased concentration, dysphoric mood and sleep disturbance. The patient is nervous/anxious.        Objective:   Physical Exam Constitutional:      General: She is not in acute distress.    Appearance: Normal appearance. She is well-developed. She is obese. She is not  ill-appearing or diaphoretic.  HENT:     Head: Normocephalic and atraumatic.     Right Ear: Tympanic membrane, ear canal and external ear normal.     Left Ear: Tympanic membrane, ear canal and external ear normal.     Nose: Nose normal. No congestion.     Mouth/Throat:     Mouth: Mucous membranes are moist.     Pharynx: Oropharynx is clear. No posterior oropharyngeal erythema.  Eyes:     General: No scleral icterus.    Extraocular Movements: Extraocular movements intact.     Conjunctiva/sclera: Conjunctivae normal.     Pupils: Pupils are equal, round, and reactive to light.  Neck:     Thyroid: No thyromegaly.     Vascular: No carotid bruit or JVD.  Cardiovascular:     Rate and Rhythm: Normal rate and regular rhythm.     Pulses: Normal pulses.     Heart sounds: Normal heart sounds.     No gallop.  Pulmonary:     Effort: Pulmonary effort is normal. No respiratory distress.     Breath sounds: Normal breath sounds. No wheezing.     Comments: Good air exch Chest:     Chest wall: No tenderness.  Abdominal:     General: Bowel sounds are normal. There is no distension or abdominal bruit.     Palpations: Abdomen is soft. There is no mass.     Tenderness: There is no  abdominal tenderness.     Hernia: No hernia is present.  Genitourinary:    Comments: Breast and pelvic exam done by gyn provider Musculoskeletal:        General: No tenderness. Normal range of motion.     Cervical back: Normal range of motion and neck supple. No rigidity. No muscular tenderness.     Right lower leg: No edema.     Left lower leg: No edema.     Comments: No kyphosis   Lymphadenopathy:     Cervical: No cervical adenopathy.  Skin:    General: Skin is warm and dry.     Coloration: Skin is not pale.     Findings: No erythema or rash.     Comments: Solar lentigines diffusely   Neurological:     Mental Status: She is alert. Mental status is at baseline.     Cranial Nerves: No cranial nerve deficit.     Motor: No abnormal muscle tone.     Coordination: Coordination normal.     Gait: Gait normal.     Deep Tendon Reflexes: Reflexes are normal and symmetric. Reflexes normal.  Psychiatric:        Mood and Affect: Mood normal.        Cognition and Memory: Cognition and memory normal.           Assessment & Plan:   Problem List Items Addressed This Visit       Cardiovascular and Mediastinum   Chronic diastolic heart failure (HCC)    tx by cardiology  entresto Torsemide prn Aldactone carvedilol      Relevant Medications   rosuvastatin (CRESTOR) 20 MG tablet   Essential hypertension    bp in fair control at this time  BP Readings from Last 1 Encounters:  01/22/22 136/80  No changes needed Most recent labs reviewed  Disc lifstyle change with low sodium diet and exercise  Aldactone 25 mg daily  Carvedilol 6.25 mg bid entresto bid        Relevant Medications  rosuvastatin (CRESTOR) 20 MG tablet     Digestive   GERD    Is unable to go without protonix /ppi  B12 and D are low  Enc wt loss and watch diet         Endocrine   Cushing's syndrome (HCC)    Continues endo care       Hyperlipidemia associated with type 2 diabetes mellitus ([place])     Disc goals for lipids and reasons to control them Rev last labs with pt Rev low sat fat diet in detail LDL 127 on crestor 10  Goal under 70 Will inc to 20 mg daily and re check at 6 mo f/u      Relevant Medications   rosuvastatin (CRESTOR) 20 MG tablet   Postsurgical hypothyroidism    Under care of endo Lab Results  Component Value Date   TSH 4.48 01/15/2022   Taking levothyroxine 175 mcg  Is fatigued         Other   Colon cancer screening    Pt had to cancel colonoscopy to care for her father  Plans to re schedule       Current use of proton pump inhibitor    Both B12 and D are low  Will supplement        Depression with anxiety    Stable with wellbutrin and zoloft  Reviewed stressors/ coping techniques/symptoms/ support sources/ tx options and side effects in detail today Stress is high      Routine general medical examination at a health care facility - Primary    Reviewed health habits including diet and exercise and skin cancer prevention Reviewed appropriate screening tests for age  Also reviewed health mt list, fam hx and immunization status , as well as social and family history   See HPI Labs reviewed  Plans to check on coverage of shingrix Under gyn care for mammogram and pap Colonoscopy overdue-pt plans to call and re schedule Eye exam utd        Vitamin B12 deficiency    Lab Results  Component Value Date   VITAMINB12 170 (L) 01/15/2022  Is on ppi Will arrange B12 shot weekly for 4 weeks Oral suppl 1000 mcg daily  F/u 6 wk for visit and re check  This should help fatigue and memory/cognitoin hopefully      Vitamin D deficiency    D level 14.75 Disc imp to bone and overall health Recommend 2000 iu vit D3 daily

## 2022-01-22 NOTE — Assessment & Plan Note (Signed)
Under care of endo Lab Results  Component Value Date   TSH 4.48 01/15/2022   Taking levothyroxine 175 mcg  Is fatigued

## 2022-01-22 NOTE — Assessment & Plan Note (Signed)
Pt had to cancel colonoscopy to care for her father  Plans to re schedule

## 2022-01-22 NOTE — Assessment & Plan Note (Signed)
Disc goals for lipids and reasons to control them Rev last labs with pt Rev low sat fat diet in detail LDL 127 on crestor 10  Goal under 70 Will inc to 20 mg daily and re check at 6 mo f/u

## 2022-01-22 NOTE — Patient Instructions (Addendum)
Strength training and high protein diet is very important for your general health   Start to budget time for shopping and food prep and exercise  Have your family help with this   If you are interested in the shingles vaccine series (Shingrix), call your insurance or pharmacy to check on coverage and location it must be given.  If affordable - you can schedule it here or at your pharmacy depending on coverage   Call and schedule your colonoscopy  If Dr Ardis Hughs is out- schedule with another doctor   You have low B12  Let's schedule a B12 shot every week for 4 weeks (starting today)  Also get vitamin B12 over the counter and take 1000 mcg daily   Get vitamin D3 over the counter and take 2000 iu every day   Go up on crestor to 20 mg daily   Follow up with me in 6 weeks and we will check labs that day (eat light )

## 2022-01-22 NOTE — Assessment & Plan Note (Signed)
Under endo care  For f/u improved

## 2022-01-22 NOTE — Assessment & Plan Note (Signed)
Lab Results  Component Value Date   VITAMINB12 170 (L) 01/15/2022   Is on ppi Will arrange B12 shot weekly for 4 weeks Oral suppl 1000 mcg daily  F/u 6 wk for visit and re check  This should help fatigue and memory/cognitoin hopefully

## 2022-01-22 NOTE — Assessment & Plan Note (Signed)
Continues endo care

## 2022-01-28 ENCOUNTER — Encounter: Payer: Self-pay | Admitting: Family Medicine

## 2022-01-28 NOTE — Telephone Encounter (Signed)
That is fine with me  Please note for the nurse visit  Thanks

## 2022-01-29 ENCOUNTER — Ambulatory Visit (INDEPENDENT_AMBULATORY_CARE_PROVIDER_SITE_OTHER): Payer: No Typology Code available for payment source

## 2022-01-29 DIAGNOSIS — Z23 Encounter for immunization: Secondary | ICD-10-CM

## 2022-01-29 DIAGNOSIS — E538 Deficiency of other specified B group vitamins: Secondary | ICD-10-CM

## 2022-01-29 MED ORDER — CYANOCOBALAMIN 1000 MCG/ML IJ SOLN
1000.0000 ug | Freq: Once | INTRAMUSCULAR | Status: AC
  Administered 2022-01-29: 1000 ug via INTRAMUSCULAR

## 2022-01-29 NOTE — Progress Notes (Signed)
Per orders of Dr. Loura Pardon, injection of B-12 and Shingles #1  given by Francella Solian. B-12 Given in left deltoid and Shingles #1 given in right deltoid. Patient was given copy of VIS and all information will be updated in [facility].  Patient tolerated injection well. Patient will make appointment for 1 week for next b-12.

## 2022-02-05 ENCOUNTER — Ambulatory Visit (INDEPENDENT_AMBULATORY_CARE_PROVIDER_SITE_OTHER): Payer: No Typology Code available for payment source

## 2022-02-05 DIAGNOSIS — E538 Deficiency of other specified B group vitamins: Secondary | ICD-10-CM

## 2022-02-05 MED ORDER — CYANOCOBALAMIN 1000 MCG/ML IJ SOLN
1000.0000 ug | Freq: Once | INTRAMUSCULAR | Status: AC
  Administered 2022-02-05: 1000 ug via INTRAMUSCULAR

## 2022-02-05 NOTE — Progress Notes (Signed)
Per orders of Dr. Marne Tower, injection of Vitamin B 12 given in left deltoid given by Lakeyia Surber. Patient tolerated injection well.  

## 2022-02-12 ENCOUNTER — Ambulatory Visit (INDEPENDENT_AMBULATORY_CARE_PROVIDER_SITE_OTHER): Payer: No Typology Code available for payment source | Admitting: *Deleted

## 2022-02-12 ENCOUNTER — Encounter: Payer: Self-pay | Admitting: Internal Medicine

## 2022-02-12 DIAGNOSIS — E538 Deficiency of other specified B group vitamins: Secondary | ICD-10-CM | POA: Diagnosis not present

## 2022-02-12 LAB — HM DIABETES EYE EXAM

## 2022-02-12 MED ORDER — CYANOCOBALAMIN 1000 MCG/ML IJ SOLN
1000.0000 ug | Freq: Once | INTRAMUSCULAR | Status: AC
  Administered 2022-02-12: 1000 ug via INTRAMUSCULAR

## 2022-02-12 NOTE — Progress Notes (Signed)
Per orders of Dr. Damita Dunnings, injection of vitamin B 12 given by Emelia Salisbury. Patient tolerated injection well.

## 2022-02-20 ENCOUNTER — Encounter (INDEPENDENT_AMBULATORY_CARE_PROVIDER_SITE_OTHER): Payer: No Typology Code available for payment source | Admitting: Ophthalmology

## 2022-03-05 ENCOUNTER — Ambulatory Visit: Payer: No Typology Code available for payment source | Admitting: Family Medicine

## 2022-03-05 ENCOUNTER — Encounter: Payer: Self-pay | Admitting: Family Medicine

## 2022-03-05 VITALS — BP 150/80 | HR 79 | Temp 97.2°F | Ht 65.0 in | Wt 244.0 lb

## 2022-03-05 DIAGNOSIS — E538 Deficiency of other specified B group vitamins: Secondary | ICD-10-CM | POA: Diagnosis not present

## 2022-03-05 DIAGNOSIS — I10 Essential (primary) hypertension: Secondary | ICD-10-CM | POA: Diagnosis not present

## 2022-03-05 DIAGNOSIS — J0100 Acute maxillary sinusitis, unspecified: Secondary | ICD-10-CM

## 2022-03-05 DIAGNOSIS — E559 Vitamin D deficiency, unspecified: Secondary | ICD-10-CM | POA: Diagnosis not present

## 2022-03-05 DIAGNOSIS — F4321 Adjustment disorder with depressed mood: Secondary | ICD-10-CM

## 2022-03-05 LAB — VITAMIN B12: Vitamin B-12: 990 pg/mL — ABNORMAL HIGH (ref 211–911)

## 2022-03-05 MED ORDER — FLUTICASONE PROPIONATE 50 MCG/ACT NA SUSP: 2.0000 | Freq: Every day | NASAL | 11 refills | Status: AC | PRN

## 2022-03-05 MED ORDER — PROAIR RESPICLICK 108 (90 BASE) MCG/ACT IN AEPB: 2.0000 | INHALATION_SPRAY | Freq: Four times a day (QID) | RESPIRATORY_TRACT | 2 refills | Status: AC | PRN

## 2022-03-05 MED ORDER — AMOXICILLIN-POT CLAVULANATE 875-125 MG PO TABS: 1.0000 | ORAL_TABLET | Freq: Two times a day (BID) | ORAL | 0 refills | Status: DC

## 2022-03-05 NOTE — Assessment & Plan Note (Signed)
Bp is up due to upset/crying/grief today  Will re check at next visit

## 2022-03-05 NOTE — Assessment & Plan Note (Signed)
After sudden unexpected loss of son (pending post mort but no diag) Pt notes she always worried about him [name] drug inv  Reviewed stressors/ coping techniques/symptoms/ support sources/ tx options and side effects in detail today She has good support Still in state of overwhelmedness and declines any grief counseling yet  Disc imp of self care / meals/fluids and rest  Will reach out for ref when/if needed and monitor closely

## 2022-03-05 NOTE — Assessment & Plan Note (Signed)
Taking vit D 2000 iu daily

## 2022-03-05 NOTE — Assessment & Plan Note (Signed)
B12 level today after 4 weekly shots and 1000 mcg daily otc   Overall pt did note some imp in fatigue

## 2022-03-05 NOTE — Progress Notes (Signed)
Subjective:    Patient ID: [name], female    DOB: 1965-10-02, 56 y.o.   MRN: [id number]  HPI Pt presents for f/u of fatigue and B12 def  Also grief/lost her son  Also sinus symptoms    Wt Readings from Last 3 Encounters:  03/05/22 244 lb (110.7 kg)  01/22/22 262 lb 2 oz (118.9 kg)  06/11/21 268 lb 9.6 oz (121.8 kg)   40.60 kg/m  Pt lost her son 12-May-2022  Was found dead in his bed  Waiting on a post mortem exam  He did use drugs in the past  Also had c/o cp for a long time   She has good support  Surrounded by people  [place] child is 59 y old   Declines counseling until arrangements are made   She feels really tired  She could tell the B12 was helping some - definite improvement   Not sleeping well  2-4 hours at a time   No appetite  Daughter and sister are prompting her to eat   2 weeks  Cold symptoms  Now sinus cong and pain  Some wheeze-uses inhaler  Spitting out yellow mucous Pnd     Lab Results  Component Value Date   VITAMINB12 170 (L) 01/15/2022   We scheduled a B12 shot weekly times 4  Inst her to take 1000 mcg B12 daily oral otc   Currently takes ppi   Memory/cognition   Also recommended she start 2000 iu vit D3 daily for def  Last level 14.75    HTN BP Readings from Last 3 Encounters:  03/05/22 (!) 150/80  01/22/22 136/80  06/11/21 140/82   Bp is up likely due to being upset   Patient Active Problem List   Diagnosis Date Noted   Grief reaction 03/05/2022   Vitamin B12 deficiency 01/22/2022   Current use of proton pump inhibitor 11/24/2020   Colon cancer screening 11/24/2020   Vitamin D deficiency 09/27/2019   Lumbar radiculopathy 12/16/2018   Poorly controlled type 2 diabetes mellitus with peripheral neuropathy ([place]) 12/16/2018   Bilateral carpal tunnel syndrome 11/24/2018   Family history of MS (multiple sclerosis) 09/29/2018   Acute sinusitis 07/05/2014   Routine general medical examination at a health care facility  12/19/2013   Palpitations 02/03/2013   Postsurgical hypothyroidism 04/28/2012   History of thyroid cancer 04/27/2012   Cushing's syndrome ([place]) 04/27/2012   Chronic diastolic heart failure ([place]) 03/27/7251   Diastolic CHF ([place]) 66/44/0347   OSA (obstructive sleep apnea) just got cpap at home this past week 01/17/12 01/17/2012   Chronic allergic rhinitis 10/26/2010   Insomnia 10/26/2010   OBESITY, MORBID 04/27/2010   HEMORRHOIDS, INTERNAL, WITH BLEEDING 08/17/2009   GERD 04/27/2008   Essential hypertension 11/03/2007   Hyperlipidemia associated with type 2 diabetes mellitus ([place]) 05/14/2007   OTHER ORGANIC SLEEP DISORDERS 03/09/2007   Uncontrolled type 2 diabetes mellitus with peripheral neuropathy 09/11/2006   POLYCYSTIC OVARIES 09/11/2006   Iron deficiency anemia 09/11/2006   Depression with anxiety 09/11/2006   EXTERNAL HEMORRHOIDS 09/11/2006   Asthma 09/11/2006   Past Medical History:  Diagnosis Date   Allergy    SEASONAL   Anemia    iron deficiency   Anxiety    Arthritis    Asthma    a. 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   Cellulitis and abscess of leg 02/2014   CHF (congestive heart failure) (HCC)    Chronic kidney  disease    /24   Complication of anesthesia    DIFFICULT WAKING- 2011   Depression    a. Stress reaction 08/2011 in multiple social stressors.   Diabetes mellitus    Dysrhythmia    Gastritis    GERD (gastroesophageal reflux disease)    Heart murmur    Hemorrhoid    Hyperlipidemia    Hypertension    Hyperthyroidism    Insomnia    Morbid obesity (HCC)    OSA (obstructive sleep apnea) 01/17/2012   Papillary thyroid carcinoma (HCC)    PCOS (polycystic ovarian syndrome)    Sleep apnea    C PAP   Thyroid cancer ([place]) 2014   Vertigo    Past Surgical History:  Procedure Laterality Date   CATARACT EXTRACTION Left 04/13/2020   CATARACT EXTRACTION Right 06/01/2020   CHOLECYSTECTOMY     HEMORRHOID SURGERY      INTRAUTERINE DEVICE INSERTION  03/25/2009   SHOULDER SURGERY  06/23/2009   THYROIDECTOMY  04/10/2012   Procedure: THYROIDECTOMY;  Surgeon: Jerrell Belfast, MD;  Location: [facility];  Service: ENT;  Laterality: N/A;  Total Thyroidectomy   TONSILLECTOMY AND ADENOIDECTOMY  CHILD   TUBAL LIGATION     Social History   Tobacco Use   Smoking status: Former    Packs/day: 0.20    Years: 5.00    Total pack years: 1.00    Types: Cigarettes    Quit date: 03/25/2004    Years since quitting: 17.9   Smokeless tobacco: Never  Vaping Use   Vaping Use: Never used  Substance Use Topics   Alcohol use: No    Alcohol/week: 0.0 standard drinks of alcohol   Drug use: No   Family History  Problem Relation Age of Onset   Arthritis Mother        osteoarthritis   Asthma Mother    Hypertension Mother    Multiple sclerosis Mother    Cancer Father        colon cancer   Arthritis Father        osteoarthritis   Colon cancer Father    Colon polyps Father    Transient ischemic attack Father    COPD Father    Heart disease Maternal Grandfather    Ovarian cancer Maternal Aunt    Diabetes Paternal Aunt    Diabetes Paternal Uncle    Stomach cancer Neg Hx    Esophageal cancer Neg Hx    Allergies  Allergen Reactions   Tape Rash    Plastic tape   Buspirone Hcl Nausea Only   Glipizide     REACTION: sleepy, low sugar   Ivp Dye [Iodinated Contrast Media]     Shut kidneys down   Oxycodone-Acetaminophen Itching   Current Outpatient Medications on File Prior to Visit  Medication Sig Dispense Refill   buPROPion (WELLBUTRIN XL) 150 MG 24 hr tablet TAKE 1 TABLET BY MOUTH EVERY DAY 90 tablet 0   carvedilol (COREG) 6.25 MG tablet TAKE 1 TABLET BY MOUTH 2 TIMES DAILY WITH A MEAL. 180 tablet 3   cetirizine (ZYRTEC) 10 MG tablet TAKE 1 TABLET BY MOUTH EVERY DAY 90 tablet 0   cholecalciferol (VITAMIN D3) 25 MCG (1000 UNIT) tablet Take 2,000 Units by mouth daily.     cholestyramine (QUESTRAN) 4 g packet Take 1  packet (4 g total) by mouth daily. 30 each 6   Continuous Blood Gluc Sensor (FREESTYLE LIBRE 3 SENSOR) MISC 1 each by Does not apply route every 14 (  fourteen) days. 6 each 3   cyanocobalamin (VITAMIN B12) 1000 MCG tablet Take 1,000 mcg by mouth daily.     empagliflozin (JARDIANCE) 25 MG TABS tablet Take 1 tablet (25 mg total) by mouth daily before breakfast. 90 tablet 3   ENTRESTO 49-51 MG TAKE 1 TABLET BY MOUTH TWICE A DAY 60 tablet 3   ferrous sulfate 325 (65 FE) MG tablet Take 325 mg by mouth 2 (two) times daily.       glucose blood (ONE TOUCH ULTRA TEST) test strip Use 2x a day 100 each 2   insulin glargine, 1 Unit Dial, (TOUJEO SOLOSTAR) 300 UNIT/ML Solostar Pen Inject 90 Units into the skin at bedtime. 18 mL 3   insulin lispro (HUMALOG KWIKPEN) 200 UNIT/ML KwikPen Inject 10-20 Units into the skin 3 (three) times daily before meals. 30 mL 3   Insulin Pen Needle 32G X 4 MM MISC Use 5x a day 400 each 3   levothyroxine (SYNTHROID) 175 MCG tablet Take 1 tablet daily in am, fasting 90 tablet 3   metFORMIN (GLUCOPHAGE) 1000 MG tablet Take 1 tablet (1,000 mg total) by mouth 2 (two) times daily with a meal. 180 tablet 3   Na Sulfate-K Sulfate-Mg Sulf (SUPREP BOWEL PREP KIT) 17.5-3.13-1.6 GM/177ML SOLN Take 1 kit by mouth as directed. 324 mL 0   pantoprazole (PROTONIX) 40 MG tablet TAKE 1 TABLET BY MOUTH EVERY DAY 90 tablet 0   potassium chloride (KLOR-CON) 20 MEQ packet Take 20 mEq by mouth as needed.     rosuvastatin (CRESTOR) 20 MG tablet Take 1 tablet (20 mg total) by mouth daily. 90 tablet 3   sertraline (ZOLOFT) 100 MG tablet TAKE 1 AND 1/2 TABLETS BY MOUTH EVERY DAY 135 tablet 1   spironolactone (ALDACTONE) 25 MG tablet TAKE 1 TABLET (25 MG TOTAL) BY MOUTH DAILY. 90 tablet 3   torsemide (DEMADEX) 20 MG tablet Take 1 tablet (20 mg total) by mouth daily as needed. 30 tablet 6   TRAVATAN Z 0.004 % SOLN ophthalmic solution Place 1 drop into both eyes at bedtime.      zolpidem (AMBIEN) 10 MG tablet  TAKE 1/2 TO 1 TABLET BY MOUTH AT BEDTIME AS NEEDED 30 tablet 3   No current facility-administered medications on file prior to visit.    Review of Systems  Constitutional:  Negative for activity change, appetite change, fatigue, fever and unexpected weight change.  HENT:  Positive for congestion and sinus pain. Negative for ear pain, rhinorrhea, sinus pressure and sore throat.   Eyes:  Negative for pain, redness and visual disturbance.  Respiratory:  Positive for cough. Negative for shortness of breath and wheezing.   Cardiovascular:  Negative for chest pain and palpitations.  Gastrointestinal:  Negative for abdominal pain, blood in stool, constipation and diarrhea.  Endocrine: Negative for polydipsia and polyuria.  Genitourinary:  Negative for dysuria, frequency and urgency.  Musculoskeletal:  Negative for arthralgias, back pain and myalgias.  Skin:  Negative for pallor and rash.  Allergic/Immunologic: Negative for environmental allergies.  Neurological:  Positive for headaches. Negative for dizziness and syncope.  Hematological:  Negative for adenopathy. Does not bruise/bleed easily.  Psychiatric/Behavioral:  Positive for dysphoric mood and sleep disturbance. Negative for suicidal ideas. The patient is nervous/anxious.        Objective:   Physical Exam Constitutional:      General: She is not in acute distress.    Appearance: Normal appearance. She is well-developed. She is obese. She is not ill-appearing.  HENT:     Head: Normocephalic and atraumatic.     Comments: Bilateral maxillary and frontal sinus tenderness    Right Ear: Tympanic membrane and external ear normal.     Left Ear: Tympanic membrane and external ear normal.     Nose: Congestion and rhinorrhea present.     Mouth/Throat:     Pharynx: Oropharynx is clear. No oropharyngeal exudate or posterior oropharyngeal erythema.     Comments: Clear pnd Eyes:     General:        Right eye: No discharge.        Left eye: No  discharge.     Conjunctiva/sclera: Conjunctivae normal.     Pupils: Pupils are equal, round, and reactive to light.  Cardiovascular:     Rate and Rhythm: Normal rate and regular rhythm.  Pulmonary:     Effort: Pulmonary effort is normal. No respiratory distress.     Breath sounds: Wheezing present. No rales.     Comments: Good air exch Occ end exp wheeze  Musculoskeletal:     Cervical back: Normal range of motion and neck supple.  Lymphadenopathy:     Cervical: No cervical adenopathy.  Skin:    General: Skin is warm and dry.     Coloration: Skin is not pale.     Findings: No erythema or rash.  Neurological:     Mental Status: She is alert.     Cranial Nerves: No cranial nerve deficit.     Coordination: Coordination normal.     Deep Tendon Reflexes: Reflexes normal.     Comments: No tremor   Psychiatric:        Attention and Perception: Attention normal.        Mood and Affect: Mood is anxious and depressed. Affect is tearful.        Cognition and Memory: Cognition and memory normal.     Comments: Tearful when discussing loss of son Candidly discusses symptoms and stressors              Assessment & Plan:   Problem List Items Addressed This Visit       Cardiovascular and Mediastinum   Essential hypertension    Bp is up due to upset/crying/grief today  Will re check at next visit          Respiratory   Acute sinusitis    S/p uri Maxillary pain and colored mucous Px augmentin  Refill flonase  Refill albuterol (recent uri) Update if not starting to improve in a week or if worsening  Will watch wheezing carefully      Relevant Medications   amoxicillin-clavulanate (AUGMENTIN) 875-125 MG tablet   fluticasone (FLONASE) 50 MCG/ACT nasal spray     Other   Grief reaction - Primary    After sudden unexpected loss of son (pending post mort but no diag) Pt notes she always worried about him Nilda Riggs drug inv  Reviewed stressors/ coping techniques/symptoms/  support sources/ tx options and side effects in detail today She has good support Still in state of overwhelmedness and declines any grief counseling yet  Disc imp of self care / meals/fluids and rest  Will reach out for ref when/if needed and monitor closely      Vitamin B12 deficiency    B12 level today after 4 weekly shots and 1000 mcg daily otc   Overall pt did note some imp in fatigue       Relevant Orders   Vitamin B12  Vitamin D deficiency    Taking vit D 2000 iu daily

## 2022-03-05 NOTE — Assessment & Plan Note (Signed)
S/p uri Maxillary pain and colored mucous Px augmentin  Refill flonase  Refill albuterol (recent uri) Update if not starting to improve in a week or if worsening  Will watch wheezing carefully

## 2022-03-05 NOTE — Patient Instructions (Signed)
Drink fluids Get rest when you can  I refilled flonase and albuterol   Use the inhaler as needed  If wheezing worsens please call and we will px prednisone  Take augmentin for sinus infection   Let us know when you want a referral for counseling for grief   Lean on friends and family as much as you can  Take care of yourself   Labs today for B12

## 2022-03-21 ENCOUNTER — Encounter: Payer: Self-pay | Admitting: Family Medicine

## 2022-03-21 MED ORDER — CLONAZEPAM 0.5 MG PO TABS: 0.5000 mg | ORAL_TABLET | Freq: Every evening | ORAL | 1 refills | Status: DC | PRN

## 2022-03-21 NOTE — Telephone Encounter (Signed)
Letter printed and placed in your inbox for review

## 2022-03-21 NOTE — Telephone Encounter (Signed)
That needs to go to her cardiologist , thanks

## 2022-03-21 NOTE — Telephone Encounter (Signed)
Note routed to cardiology

## 2022-03-26 ENCOUNTER — Other Ambulatory Visit (HOSPITAL_COMMUNITY): Payer: Self-pay

## 2022-03-26 ENCOUNTER — Encounter (HOSPITAL_COMMUNITY): Payer: Self-pay | Admitting: Pharmacy Technician

## 2022-03-26 ENCOUNTER — Telehealth (HOSPITAL_COMMUNITY): Payer: Self-pay | Admitting: Pharmacy Technician

## 2022-03-26 NOTE — Telephone Encounter (Unsigned)
Advanced Heart Failure Patient Advocate Encounter  Called and spoke with patient who had concerns about Entresto coverage. She provided new insurance information. Entresto 30 day co-pay, $50. Sent patient mychart message with Delene Loll and [place] co-pay cards (stated she has never had them before). Insurance did state that a PA is required. Will work on submitting PA and will call patient if PA is denied.  Charlann Boxer, CPhT

## 2022-03-27 ENCOUNTER — Other Ambulatory Visit (HOSPITAL_COMMUNITY): Payer: Self-pay

## 2022-03-27 NOTE — Telephone Encounter (Signed)
Advanced Heart Failure Patient Advocate Encounter  Prior authorization for [facility] submitted and APPROVED. Key [id number] Effective: 03/26/22 - 03/26/23  Prior authorization for Jardiance submitted and APPROVED. Key [id number] Effective: 03/27/22 - 03/26/23

## 2022-04-01 ENCOUNTER — Encounter (INDEPENDENT_AMBULATORY_CARE_PROVIDER_SITE_OTHER): Payer: No Typology Code available for payment source | Admitting: Ophthalmology

## 2022-04-01 ENCOUNTER — Telehealth: Payer: Self-pay | Admitting: Family Medicine

## 2022-04-01 DIAGNOSIS — H43813 Vitreous degeneration, bilateral: Secondary | ICD-10-CM

## 2022-04-01 DIAGNOSIS — E113291 Type 2 diabetes mellitus with mild nonproliferative diabetic retinopathy without macular edema, right eye: Secondary | ICD-10-CM

## 2022-04-01 DIAGNOSIS — I10 Essential (primary) hypertension: Secondary | ICD-10-CM | POA: Diagnosis not present

## 2022-04-01 DIAGNOSIS — H35033 Hypertensive retinopathy, bilateral: Secondary | ICD-10-CM

## 2022-04-01 DIAGNOSIS — E113312 Type 2 diabetes mellitus with moderate nonproliferative diabetic retinopathy with macular edema, left eye: Secondary | ICD-10-CM | POA: Diagnosis not present

## 2022-04-01 NOTE — Telephone Encounter (Signed)
No further action needed at this time.

## 2022-04-01 NOTE — Telephone Encounter (Signed)
Left message to return call to our office.  

## 2022-04-01 NOTE — Telephone Encounter (Signed)
Thanks for letting me know Stay off the clonazepam Schedule in office appt please

## 2022-04-01 NOTE — Telephone Encounter (Signed)
Pt returned called to office. Stared she will call back to schedule F/U with PCP

## 2022-04-03 ENCOUNTER — Telehealth: Payer: Self-pay | Admitting: Family Medicine

## 2022-04-03 NOTE — Telephone Encounter (Signed)
error 

## 2022-04-03 NOTE — Telephone Encounter (Signed)
Patient spoke with Dr Olena Mater was advised to come in to be seen on Friday. She can come until after 2,because of work and DR [employer] have any appointment available. She began to sob and started to talk to me about the death of her son. I advised her that I would ask DR Glori Bickers is there a way she can be squeezed In on Friday after 2. She would like a phone call to discuss getting her scheduled.

## 2022-04-03 NOTE — Telephone Encounter (Signed)
Patient called and wanted Dr. Glori Bickers to give her a doctors note for 04/04/2022 and 04/05/2022 and can it be sent to her my chart as well.

## 2022-04-03 NOTE — Telephone Encounter (Signed)
The noted is done  I will print for pick up  (in [facility] In box)  or she can see on mychart

## 2022-04-03 NOTE — Telephone Encounter (Signed)
Left VM letting pt know letter should be available on mychart and I also placed a paper copy up front for pick up if she needs it.

## 2022-04-03 NOTE — Telephone Encounter (Deleted)
Error

## 2022-04-05 ENCOUNTER — Ambulatory Visit: Payer: No Typology Code available for payment source | Admitting: Family Medicine

## 2022-04-05 ENCOUNTER — Encounter: Payer: Self-pay | Admitting: Family Medicine

## 2022-04-05 ENCOUNTER — Other Ambulatory Visit: Payer: Self-pay | Admitting: Family Medicine

## 2022-04-05 VITALS — BP 134/86 | HR 77 | Temp 97.2°F | Ht 65.0 in | Wt 243.4 lb

## 2022-04-05 DIAGNOSIS — F418 Other specified anxiety disorders: Secondary | ICD-10-CM | POA: Diagnosis not present

## 2022-04-05 DIAGNOSIS — F4321 Adjustment disorder with depressed mood: Secondary | ICD-10-CM | POA: Diagnosis not present

## 2022-04-05 MED ORDER — HYDROXYZINE PAMOATE 25 MG PO CAPS: 25.0000 mg | ORAL_CAPSULE | Freq: Three times a day (TID) | ORAL | 1 refills | Status: DC | PRN

## 2022-04-05 MED ORDER — SERTRALINE HCL 100 MG PO TABS: 200.0000 mg | ORAL_TABLET | Freq: Every day | ORAL | 1 refills | Status: DC

## 2022-04-05 NOTE — Telephone Encounter (Signed)
200 mg daily  I re sent it

## 2022-04-05 NOTE — Assessment & Plan Note (Signed)
Lost son unexpectedly last month  Struggling at work  Did write out 1 mo - will send fmla  Looking into adult grief counseling opt through [facility] and will let us know   See a/p   for anx/dep

## 2022-04-05 NOTE — Telephone Encounter (Signed)
Says Rx failed to go to pharmacy so called pharmacy and confirm the correct dose

## 2022-04-05 NOTE — Assessment & Plan Note (Signed)
Struggling with grief after loss of son last month unexpectedly  Reviewed stressors/ coping techniques/symptoms/ support sources/ tx options and side effects in detail today Plan to get a mo off work (will send fmla) Both dep and anx sympt Anx may be causing itching as well  Willl try hydroxyzine 25 mg for anx, sleep and itching (warned to see how sedating before using ambien) Will inc sertraline to 200 mg as tolerated  Continue wellbutrin Looking into grief counseling Intol of clonazepam  Disc self care Has good supp- family  F/u in 2 wk  Will try to get month of fmla for work/ needs to be out due to acute grief  No SI currently , and inst if she does develop desire to hurt herself to go to [facility] (given contact info and husb is aware) or ER Voiced understanding

## 2022-04-05 NOTE — Progress Notes (Signed)
Subjective:    Patient ID: [name], female    DOB: 1965-12-04, 57 y.o.   MRN: [id number]  HPI Pt presents for f/u of acute grief reaction  with anxiety and depression   Wt Readings from Last 3 Encounters:  03/05/22 244 lb (110.7 kg)  01/22/22 262 lb 2 oz (118.9 kg)  06/11/21 268 lb 9.6 oz (121.8 kg)   40.60 kg/m   Lost her son last month unexpectedly  May have been drug related   Work- cannot function  Sent her home Wednesday  They are fairly understanding  Dislikes the people she works with  - has been there since 2006 Works with [employer]   Is itchy  No rash   Is irritability   Sleep: not sleeping at all  Appetite : low   H/o depression and anxiety  Wellbutrin xl 150 mg daily  Zoloft 150 mg daily  Ambien 10 mg prn sleep   Was px clonazepam acutely for distress and she did not tolerate it - caused itching) It did not work for her at all   Cannot wear cpap  Anxiety makes her feel sob  Up all night     In the past buspar gave her nausea  Good support in husband  Is not taking care of anyone else  She depends on faith also   Tries to be around grand kids   Would like to be in a program -[facility]  Interested   Does not feel suicidal  At times feels like it would be easier to not be here   Patient Active Problem List   Diagnosis Date Noted   Grief reaction 03/05/2022   Vitamin B12 deficiency 01/22/2022   Current use of proton pump inhibitor 11/24/2020   Colon cancer screening 11/24/2020   Vitamin D deficiency 09/27/2019   Lumbar radiculopathy 12/16/2018   Poorly controlled type 2 diabetes mellitus with peripheral neuropathy ([facility]) 12/16/2018   Bilateral carpal tunnel syndrome 11/24/2018   Family history of MS (multiple sclerosis) 09/29/2018   Acute sinusitis 07/05/2014   Routine general medical examination at a health care facility 12/19/2013   Palpitations 02/03/2013   Postsurgical hypothyroidism 04/28/2012    History of thyroid cancer 04/27/2012   Cushing's syndrome ([facility]) 04/27/2012   Chronic diastolic heart failure ([facility]) 81/03/7508   Diastolic CHF ([facility]) 25/85/2778   OSA (obstructive sleep apnea) just got cpap at home this past week 01/17/12 01/17/2012   Chronic allergic rhinitis 10/26/2010   Insomnia 10/26/2010   OBESITY, MORBID 04/27/2010   HEMORRHOIDS, INTERNAL, WITH BLEEDING 08/17/2009   GERD 04/27/2008   Essential hypertension 11/03/2007   Hyperlipidemia associated with type 2 diabetes mellitus ([facility]) 05/14/2007   OTHER ORGANIC SLEEP DISORDERS 03/09/2007   Uncontrolled type 2 diabetes mellitus with peripheral neuropathy 09/11/2006   POLYCYSTIC OVARIES 09/11/2006   Iron deficiency anemia 09/11/2006   Depression with anxiety 09/11/2006   EXTERNAL HEMORRHOIDS 09/11/2006   Asthma 09/11/2006   Past Medical History:  Diagnosis Date   Allergy    SEASONAL   Anemia    iron deficiency   Anxiety    Arthritis    Asthma    a. 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   Cellulitis and abscess of leg 02/2014   CHF (congestive heart failure) ([facility])    Chronic kidney disease    /24   Complication of anesthesia    DIFFICULT WAKING- 2011   Depression  a. Stress reaction 08/2011 in multiple social stressors.   Diabetes mellitus    Dysrhythmia    Gastritis    GERD (gastroesophageal reflux disease)    Heart murmur    Hemorrhoid    Hyperlipidemia    Hypertension    Hyperthyroidism    Insomnia    Morbid obesity ([facility])    OSA (obstructive sleep apnea) 01/17/2012   Papillary thyroid carcinoma ([facility])    PCOS (polycystic ovarian syndrome)    Sleep apnea    C PAP   Thyroid cancer ([facility]) 2014   Vertigo    Past Surgical History:  Procedure Laterality Date   CATARACT EXTRACTION Left 04/13/2020   CATARACT EXTRACTION Right 06/01/2020   CHOLECYSTECTOMY     HEMORRHOID SURGERY     INTRAUTERINE DEVICE INSERTION  03/25/2009   SHOULDER SURGERY  06/23/2009    THYROIDECTOMY  04/10/2012   Procedure: THYROIDECTOMY;  Surgeon: Jerrell Belfast, MD;  Location: [facility];  Service: ENT;  Laterality: N/A;  Total Thyroidectomy   TONSILLECTOMY AND ADENOIDECTOMY  CHILD   TUBAL LIGATION     Social History   Tobacco Use   Smoking status: Former    Packs/day: 0.20    Years: 5.00    Total pack years: 1.00    Types: Cigarettes    Quit date: 03/25/2004    Years since quitting: 18.0   Smokeless tobacco: Never  Vaping Use   Vaping Use: Never used  Substance Use Topics   Alcohol use: No    Alcohol/week: 0.0 standard drinks of alcohol   Drug use: No   Family History  Problem Relation Age of Onset   Arthritis Mother        osteoarthritis   Asthma Mother    Hypertension Mother    Multiple sclerosis Mother    Cancer Father        colon cancer   Arthritis Father        osteoarthritis   Colon cancer Father    Colon polyps Father    Transient ischemic attack Father    COPD Father    Heart disease Maternal Grandfather    Ovarian cancer Maternal Aunt    Diabetes Paternal Aunt    Diabetes Paternal Uncle    Stomach cancer Neg Hx    Esophageal cancer Neg Hx    Allergies  Allergen Reactions   Tape Rash    Plastic tape   Buspirone Hcl Nausea Only   Glipizide     REACTION: sleepy, low sugar   Ivp Dye [Iodinated Contrast Media]     Shut kidneys down   Oxycodone-Acetaminophen Itching   Current Outpatient Medications on File Prior to Visit  Medication Sig Dispense Refill   Albuterol Sulfate (PROAIR RESPICLICK) 335 (90 Base) MCG/ACT AEPB Inhale 2 puffs into the lungs every 6 (six) hours as needed. 1 each 2   amoxicillin-clavulanate (AUGMENTIN) 875-125 MG tablet Take 1 tablet by mouth 2 (two) times daily. 14 tablet 0   buPROPion (WELLBUTRIN XL) 150 MG 24 hr tablet TAKE 1 TABLET BY MOUTH EVERY DAY 90 tablet 0   carvedilol (COREG) 6.25 MG tablet TAKE 1 TABLET BY MOUTH 2 TIMES DAILY WITH A MEAL. 180 tablet 3   cetirizine (ZYRTEC) 10 MG tablet TAKE 1 TABLET BY  MOUTH EVERY DAY 90 tablet 0   cholecalciferol (VITAMIN D3) 25 MCG (1000 UNIT) tablet Take 2,000 Units by mouth daily.     cholestyramine (QUESTRAN) 4 g packet Take 1 packet (4 g total) by mouth daily.  30 each 6   Continuous Blood Gluc Sensor (FREESTYLE LIBRE 3 SENSOR) MISC 1 each by Does not apply route every 14 (fourteen) days. 6 each 3   cyanocobalamin (VITAMIN B12) 1000 MCG tablet Take 1,000 mcg by mouth daily.     empagliflozin (JARDIANCE) 25 MG TABS tablet Take 1 tablet (25 mg total) by mouth daily before breakfast. 90 tablet 3   ENTRESTO 49-51 MG TAKE 1 TABLET BY MOUTH TWICE A DAY 60 tablet 3   ferrous sulfate 325 (65 FE) MG tablet Take 325 mg by mouth 2 (two) times daily.       fluticasone (FLONASE) 50 MCG/ACT nasal spray Place 2 sprays into both nostrils daily as needed for allergies. 16 g 11   glucose blood (ONE TOUCH ULTRA TEST) test strip Use 2x a day 100 each 2   insulin glargine, 1 Unit Dial, (TOUJEO SOLOSTAR) 300 UNIT/ML Solostar Pen Inject 90 Units into the skin at bedtime. 18 mL 3   insulin lispro (HUMALOG KWIKPEN) 200 UNIT/ML KwikPen Inject 10-20 Units into the skin 3 (three) times daily before meals. 30 mL 3   Insulin Pen Needle 32G X 4 MM MISC Use 5x a day 400 each 3   levothyroxine (SYNTHROID) 175 MCG tablet Take 1 tablet daily in am, fasting 90 tablet 3   metFORMIN (GLUCOPHAGE) 1000 MG tablet Take 1 tablet (1,000 mg total) by mouth 2 (two) times daily with a meal. 180 tablet 3   Na Sulfate-K Sulfate-Mg Sulf (SUPREP BOWEL PREP KIT) 17.5-3.13-1.6 GM/177ML SOLN Take 1 kit by mouth as directed. 324 mL 0   pantoprazole (PROTONIX) 40 MG tablet TAKE 1 TABLET BY MOUTH EVERY DAY 90 tablet 0   potassium chloride (KLOR-CON) 20 MEQ packet Take 20 mEq by mouth as needed.     rosuvastatin (CRESTOR) 20 MG tablet Take 1 tablet (20 mg total) by mouth daily. 90 tablet 3   spironolactone (ALDACTONE) 25 MG tablet TAKE 1 TABLET (25 MG TOTAL) BY MOUTH DAILY. 90 tablet 3   torsemide (DEMADEX) 20 MG  tablet Take 1 tablet (20 mg total) by mouth daily as needed. 30 tablet 6   TRAVATAN Z 0.004 % SOLN ophthalmic solution Place 1 drop into both eyes at bedtime.      zolpidem (AMBIEN) 10 MG tablet TAKE 1/2 TO 1 TABLET BY MOUTH AT BEDTIME AS NEEDED 30 tablet 3   No current facility-administered medications on file prior to visit.    Review of Systems  Constitutional:  Positive for fatigue. Negative for activity change, appetite change, fever and unexpected weight change.  HENT:  Negative for congestion, ear pain, rhinorrhea, sinus pressure and sore throat.   Eyes:  Negative for pain, redness and visual disturbance.  Respiratory:  Negative for cough, shortness of breath and wheezing.   Cardiovascular:  Negative for chest pain and palpitations.  Gastrointestinal:  Negative for abdominal pain, blood in stool, constipation and diarrhea.  Endocrine: Negative for polydipsia and polyuria.  Genitourinary:  Negative for dysuria, frequency and urgency.  Musculoskeletal:  Negative for arthralgias, back pain and myalgias.  Skin:  Negative for pallor and rash.  Allergic/Immunologic: Negative for environmental allergies.  Neurological:  Negative for dizziness, syncope and headaches.  Hematological:  Negative for adenopathy. Does not bruise/bleed easily.  Psychiatric/Behavioral:  Positive for decreased concentration, dysphoric mood and sleep disturbance. Negative for confusion, self-injury and suicidal ideas. The patient is nervous/anxious.        Objective:   Physical Exam Constitutional:  General: She is not in acute distress.    Appearance: Normal appearance. She is well-developed. She is obese. She is not ill-appearing or diaphoretic.  HENT:     Head: Normocephalic and atraumatic.  Eyes:     Conjunctiva/sclera: Conjunctivae normal.     Pupils: Pupils are equal, round, and reactive to light.  Neck:     Thyroid: No thyromegaly.     Vascular: No carotid bruit or JVD.  Cardiovascular:      Rate and Rhythm: Normal rate and regular rhythm.     Heart sounds: Normal heart sounds.     No gallop.  Pulmonary:     Effort: Pulmonary effort is normal. No respiratory distress.     Breath sounds: Normal breath sounds. No wheezing or rales.  Abdominal:     General: There is no distension or abdominal bruit.     Palpations: Abdomen is soft.  Musculoskeletal:     Cervical back: Normal range of motion and neck supple.     Right lower leg: No edema.     Left lower leg: No edema.  Lymphadenopathy:     Cervical: No cervical adenopathy.  Skin:    General: Skin is warm and dry.     Coloration: Skin is not pale.     Findings: No rash.  Neurological:     Mental Status: She is alert.     Coordination: Coordination normal.     Deep Tendon Reflexes: Reflexes are normal and symmetric. Reflexes normal.  Psychiatric:        Attention and Perception: Attention normal.        Mood and Affect: Mood is anxious and depressed. Affect is tearful.        Speech: Speech normal.        Behavior: Behavior normal.        Thought Content: Thought content is not paranoid. Thought content does not include suicidal ideation.        Cognition and Memory: Memory normal. Cognition is impaired.     Comments: Candidly discusses symptoms and stressors   Supportive husband present            Assessment & Plan:   Problem List Items Addressed This Visit       Other   Depression with anxiety    Struggling with grief after loss of son last month unexpectedly  Reviewed stressors/ coping techniques/symptoms/ support sources/ tx options and side effects in detail today Plan to get a mo off work (will send fmla) Both dep and anx sympt Anx may be causing itching as well  Willl try hydroxyzine 25 mg for anx, sleep and itching (warned to see how sedating before using ambien) Will inc sertraline to 200 mg as tolerated  Continue wellbutrin Looking into grief counseling Intol of clonazepam  Disc self care Has  good supp- family  F/u in 2 wk  Will try to get month of fmla for work/ needs to be out due to acute grief  No SI currently , and inst if she does develop desire to hurt herself to go to [place] (given contact info and husb is aware) or ER Voiced understanding        Relevant Medications   hydrOXYzine (VISTARIL) 25 MG capsule   sertraline (ZOLOFT) 100 MG tablet   Grief reaction - Primary    Lost son unexpectedly last month  Struggling at work  Did write out 1 mo - will send fmla  Looking into  adult grief counseling opt through [facility] and will let us know   See a/p   for anx/dep

## 2022-04-05 NOTE — Patient Instructions (Addendum)
Go up on sertrraline to 200 mg daily   Try the hydroxyzine for itching/anxiety and sleep  You can take it up to three times daily  If will sedate - use caution   Don't mix with ambien until you know how you react to it   Stay off the clonazepam   Let me know how that goes  If side effects - hold medicine and let us know   Follow up in 2 weeks   Let's keep you out of work for a month  Have your job send me FMLA paperowk  Pursue the counseling through [facility] and if you need a referral let me know   If you feel you could harm yourself - call or go t  [facility] [address], [place] [address]  [phone]

## 2022-04-05 NOTE — Telephone Encounter (Signed)
Pharmacy is asking for clarification with directions the question says:  PLEASE CLARIFY DIRECTIONS. 2 TABS OR 1 AND 1/2 TAB.   Please send in new Rx with the correct dose/ directions thanks.

## 2022-04-07 ENCOUNTER — Encounter: Payer: Self-pay | Admitting: Family Medicine

## 2022-04-08 ENCOUNTER — Telehealth: Payer: Self-pay

## 2022-04-08 NOTE — Telephone Encounter (Signed)
Done  Please fill in addresses/fax etc Thanks

## 2022-04-08 NOTE — Telephone Encounter (Signed)
Completed form faxed to fax# [phone].  Received fax confirmation.  Copies made for patient, scanning, and myself.  Patient's copy was given to Sherrilee Gilles, employee, per Dabney's request.  Patient notified via mychart.

## 2022-04-08 NOTE — Telephone Encounter (Signed)
We received patient's FMLA form on 04/05/22.  Patient saw you in office for this form to be filled out.  Placed in your inbox for completion and signing.

## 2022-04-15 ENCOUNTER — Other Ambulatory Visit (HOSPITAL_COMMUNITY): Payer: Self-pay | Admitting: Family Medicine

## 2022-04-15 ENCOUNTER — Other Ambulatory Visit: Payer: Self-pay | Admitting: Internal Medicine

## 2022-04-15 ENCOUNTER — Other Ambulatory Visit: Payer: Self-pay | Admitting: Family Medicine

## 2022-04-19 ENCOUNTER — Encounter: Payer: Self-pay | Admitting: Family Medicine

## 2022-04-19 ENCOUNTER — Ambulatory Visit: Payer: No Typology Code available for payment source | Admitting: Family Medicine

## 2022-04-19 VITALS — BP 136/72 | HR 71 | Temp 97.3°F | Ht 65.0 in | Wt 235.2 lb

## 2022-04-19 DIAGNOSIS — K219 Gastro-esophageal reflux disease without esophagitis: Secondary | ICD-10-CM | POA: Diagnosis not present

## 2022-04-19 DIAGNOSIS — F418 Other specified anxiety disorders: Secondary | ICD-10-CM | POA: Diagnosis not present

## 2022-04-19 DIAGNOSIS — F4321 Adjustment disorder with depressed mood: Secondary | ICD-10-CM

## 2022-04-19 DIAGNOSIS — Z23 Encounter for immunization: Secondary | ICD-10-CM | POA: Diagnosis not present

## 2022-04-19 NOTE — Progress Notes (Unsigned)
Subjective:    Patient ID: [name], female    DOB: Feb 10, 1966, 57 y.o.   MRN: [id number]  HPI Pt presents for f/u of grief reaction with depression  Also shingrix vaccine   Disc ppi and insurance issue  Wt Readings from Last 3 Encounters:  04/19/22 235 lb 4 oz (106.7 kg)  04/05/22 243 lb 6 oz (110.4 kg)  03/05/22 244 lb (110.7 kg)   39.15 kg/m  Vitals:   04/19/22 1423  BP: 136/72  Pulse: 71  Temp: (!) 97.3 F (36.3 C)  SpO2: 96%    Some improvement  Crying less Not quite as anxious   Itching is improved Still has it occ - uses hydroxyzine when she needs it   Still anxious-feels like cpap suffocates her  Is getting better sleep regardless which helps  Wakes up with heart beating fast- has to sit up and calm herself down  Sertraline in to 200 mg last time Hydroxyzine added for anxiety and itching  On wellbutrin   Time off work  Had discussed pursuing counseling through a kids path program   This week has pushed herself to socialize Went to a friend's house  Went to grocery and pharmacy   Went to grandchildren's basketball games also   Needs 2nd shingrix today   Not doing anything else for herself at this time   [facility] has a class 2/12  Unsure if ready for a group   Ins no longer covers her protonix Will check to see what is covered Cannot be w/o ppi  Has lost wt with grief rxn  Patient Active Problem List   Diagnosis Date Noted   Grief reaction 03/05/2022   Vitamin B12 deficiency 01/22/2022   Current use of proton pump inhibitor 11/24/2020   Colon cancer screening 11/24/2020   Vitamin D deficiency 09/27/2019   Lumbar radiculopathy 12/16/2018   Poorly controlled type 2 diabetes mellitus with peripheral neuropathy ([place]) 12/16/2018   Bilateral carpal tunnel syndrome 11/24/2018   Family history of MS (multiple sclerosis) 09/29/2018   Acute sinusitis 07/05/2014   Routine general medical examination at a health care facility  12/19/2013   Palpitations 02/03/2013   Postsurgical hypothyroidism 04/28/2012   History of thyroid cancer 04/27/2012   Cushing's syndrome ([place]) 04/27/2012   Chronic diastolic heart failure ([place]) 09/73/5329   Diastolic CHF ([place]) 92/42/6834   OSA (obstructive sleep apnea) just got cpap at home this past week 01/17/12 01/17/2012   Chronic allergic rhinitis 10/26/2010   Insomnia 10/26/2010   OBESITY, MORBID 04/27/2010   HEMORRHOIDS, INTERNAL, WITH BLEEDING 08/17/2009   GERD 04/27/2008   Essential hypertension 11/03/2007   Hyperlipidemia associated with type 2 diabetes mellitus ([place]) 05/14/2007   OTHER ORGANIC SLEEP DISORDERS 03/09/2007   Uncontrolled type 2 diabetes mellitus with peripheral neuropathy 09/11/2006   POLYCYSTIC OVARIES 09/11/2006   Iron deficiency anemia 09/11/2006   Depression with anxiety 09/11/2006   EXTERNAL HEMORRHOIDS 09/11/2006   Asthma 09/11/2006   Past Medical History:  Diagnosis Date   Allergy    SEASONAL   Anemia    iron deficiency   Anxiety    Arthritis    Asthma    a. 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   Cellulitis and abscess of leg 02/2014   CHF (congestive heart failure) ([place])    Chronic kidney disease    /19   Complication of anesthesia    DIFFICULT WAKING- 2011   Depression  a. Stress reaction 08/2011 in multiple social stressors.   Diabetes mellitus    Dysrhythmia    Gastritis    GERD (gastroesophageal reflux disease)    Heart murmur    Hemorrhoid    Hyperlipidemia    Hypertension    Hyperthyroidism    Insomnia    Morbid obesity (HCC)    OSA (obstructive sleep apnea) 01/17/2012   Papillary thyroid carcinoma (HCC)    PCOS (polycystic ovarian syndrome)    Sleep apnea    C PAP   Thyroid cancer ([place]) 2014   Vertigo    Past Surgical History:  Procedure Laterality Date   CATARACT EXTRACTION Left 04/13/2020   CATARACT EXTRACTION Right 06/01/2020   CHOLECYSTECTOMY     HEMORRHOID SURGERY      INTRAUTERINE DEVICE INSERTION  03/25/2009   SHOULDER SURGERY  06/23/2009   THYROIDECTOMY  04/10/2012   Procedure: THYROIDECTOMY;  Surgeon: Jerrell Belfast, MD;  Location: [facility];  Service: ENT;  Laterality: N/A;  Total Thyroidectomy   TONSILLECTOMY AND ADENOIDECTOMY  CHILD   TUBAL LIGATION     Social History   Tobacco Use   Smoking status: Former    Packs/day: 0.20    Years: 5.00    Total pack years: 1.00    Types: Cigarettes    Quit date: 03/25/2004    Years since quitting: 18.0   Smokeless tobacco: Never  Vaping Use   Vaping Use: Never used  Substance Use Topics   Alcohol use: No    Alcohol/week: 0.0 standard drinks of alcohol   Drug use: No   Family History  Problem Relation Age of Onset   Arthritis Mother        osteoarthritis   Asthma Mother    Hypertension Mother    Multiple sclerosis Mother    Cancer Father        colon cancer   Arthritis Father        osteoarthritis   Colon cancer Father    Colon polyps Father    Transient ischemic attack Father    COPD Father    Heart disease Maternal Grandfather    Ovarian cancer Maternal Aunt    Diabetes Paternal Aunt    Diabetes Paternal Uncle    Stomach cancer Neg Hx    Esophageal cancer Neg Hx    Allergies  Allergen Reactions   Tape Rash    Plastic tape   Buspirone Hcl Nausea Only   Glipizide     REACTION: sleepy, low sugar   Ivp Dye [Iodinated Contrast Media]     Shut kidneys down   Oxycodone-Acetaminophen Itching   Current Outpatient Medications on File Prior to Visit  Medication Sig Dispense Refill   Albuterol Sulfate (PROAIR RESPICLICK) 621 (90 Base) MCG/ACT AEPB Inhale 2 puffs into the lungs every 6 (six) hours as needed. 1 each 2   buPROPion (WELLBUTRIN XL) 150 MG 24 hr tablet TAKE 1 TABLET BY MOUTH EVERY DAY 90 tablet 0   carvedilol (COREG) 6.25 MG tablet TAKE 1 TABLET BY MOUTH 2 TIMES DAILY WITH A MEAL. 180 tablet 0   cetirizine (ZYRTEC) 10 MG tablet TAKE 1 TABLET BY MOUTH EVERY DAY 90 tablet 1    cholecalciferol (VITAMIN D3) 25 MCG (1000 UNIT) tablet Take 2,000 Units by mouth daily.     cholestyramine (QUESTRAN) 4 g packet Take 1 packet (4 g total) by mouth daily. 30 each 6   Continuous Blood Gluc Sensor (FREESTYLE LIBRE 3 SENSOR) MISC 1 each by Does not  apply route every 14 (fourteen) days. 6 each 3   cyanocobalamin (VITAMIN B12) 1000 MCG tablet Take 1,000 mcg by mouth daily.     empagliflozin (JARDIANCE) 25 MG TABS tablet Take 1 tablet (25 mg total) by mouth daily before breakfast. 90 tablet 3   ENTRESTO 49-51 MG TAKE 1 TABLET BY MOUTH TWICE A DAY 60 tablet 3   ferrous sulfate 325 (65 FE) MG tablet Take 325 mg by mouth 2 (two) times daily.       fluticasone (FLONASE) 50 MCG/ACT nasal spray Place 2 sprays into both nostrils daily as needed for allergies. 16 g 11   glucose blood (ONE TOUCH ULTRA TEST) test strip Use 2x a day 100 each 2   hydrOXYzine (VISTARIL) 25 MG capsule Take 1 capsule (25 mg total) by mouth every 8 (eight) hours as needed for anxiety or itching (insomnia). 30 capsule 1   insulin glargine, 1 Unit Dial, (TOUJEO SOLOSTAR) 300 UNIT/ML Solostar Pen Inject 90 Units into the skin at bedtime. 18 mL 3   insulin lispro (HUMALOG KWIKPEN) 200 UNIT/ML KwikPen Inject 10-20 Units into the skin 3 (three) times daily before meals. 30 mL 3   Insulin Pen Needle 32G X 4 MM MISC Use 5x a day 400 each 3   levothyroxine (SYNTHROID) 175 MCG tablet Take 1 tablet daily in am, fasting 90 tablet 3   metFORMIN (GLUCOPHAGE) 1000 MG tablet Take 1 tablet (1,000 mg total) by mouth 2 (two) times daily with a meal. 180 tablet 3   Na Sulfate-K Sulfate-Mg Sulf (SUPREP BOWEL PREP KIT) 17.5-3.13-1.6 GM/177ML SOLN Take 1 kit by mouth as directed. 324 mL 0   pantoprazole (PROTONIX) 40 MG tablet TAKE 1 TABLET BY MOUTH EVERY DAY 90 tablet 1   potassium chloride (KLOR-CON) 20 MEQ packet Take 20 mEq by mouth as needed.     rosuvastatin (CRESTOR) 20 MG tablet Take 1 tablet (20 mg total) by mouth daily. 90 tablet 3    sertraline (ZOLOFT) 100 MG tablet Take 2 tablets (200 mg total) by mouth at bedtime. 180 tablet 1   spironolactone (ALDACTONE) 25 MG tablet TAKE 1 TABLET (25 MG TOTAL) BY MOUTH DAILY. 90 tablet 3   torsemide (DEMADEX) 20 MG tablet Take 1 tablet (20 mg total) by mouth daily as needed. 30 tablet 6   TRAVATAN Z 0.004 % SOLN ophthalmic solution Place 1 drop into both eyes at bedtime.      zolpidem (AMBIEN) 10 MG tablet TAKE 1/2 TO 1 TABLET BY MOUTH AT BEDTIME AS NEEDED 30 tablet 3   No current facility-administered medications on file prior to visit.    Review of Systems  Constitutional:  Negative for activity change, appetite change, fatigue, fever and unexpected weight change.  HENT:  Negative for congestion, ear pain, rhinorrhea, sinus pressure and sore throat.   Eyes:  Negative for pain, redness and visual disturbance.  Respiratory:  Negative for cough, shortness of breath and wheezing.   Cardiovascular:  Negative for chest pain and palpitations.  Gastrointestinal:  Negative for abdominal pain, blood in stool, constipation and diarrhea.       Heartburn if she misses ppi  Endocrine: Negative for polydipsia and polyuria.  Genitourinary:  Negative for dysuria, frequency and urgency.  Musculoskeletal:  Negative for arthralgias, back pain and myalgias.  Skin:  Negative for pallor and rash.  Allergic/Immunologic: Negative for environmental allergies.  Neurological:  Negative for dizziness, syncope and headaches.  Hematological:  Negative for adenopathy. Does not bruise/bleed easily.  Psychiatric/Behavioral:  Positive for decreased concentration, dysphoric mood and sleep disturbance. Negative for self-injury and suicidal ideas. The patient is nervous/anxious.        Objective:   Physical Exam Constitutional:      General: She is not in acute distress.    Appearance: Normal appearance. She is well-developed. She is obese. She is not ill-appearing or diaphoretic.  HENT:     Head:  Normocephalic and atraumatic.  Eyes:     Conjunctiva/sclera: Conjunctivae normal.     Pupils: Pupils are equal, round, and reactive to light.  Neck:     Thyroid: No thyromegaly.     Vascular: No carotid bruit or JVD.  Cardiovascular:     Rate and Rhythm: Normal rate and regular rhythm.     Heart sounds: Normal heart sounds.     No gallop.  Pulmonary:     Effort: Pulmonary effort is normal. No respiratory distress.     Breath sounds: Normal breath sounds. No wheezing or rales.  Abdominal:     General: There is no distension or abdominal bruit.     Palpations: Abdomen is soft.  Musculoskeletal:     Cervical back: Normal range of motion and neck supple.     Right lower leg: No edema.     Left lower leg: No edema.  Lymphadenopathy:     Cervical: No cervical adenopathy.  Skin:    General: Skin is warm and dry.     Coloration: Skin is not pale.     Findings: No rash.  Neurological:     Mental Status: She is alert.     Coordination: Coordination normal.     Deep Tendon Reflexes: Reflexes are normal and symmetric. Reflexes normal.  Psychiatric:        Attention and Perception: Attention normal.        Mood and Affect: Mood is anxious and depressed. Affect is not blunt, flat or tearful.        Behavior: Behavior normal.        Cognition and Memory: Cognition and memory normal.     Comments: Some improvement in mood  Less tearful  Better concentration   Candidly discusses symptoms and stressors             Assessment & Plan:   Problem List Items Addressed This Visit       Digestive   GERD    Protonix no longer covered by her ins   Inst to check with pharm/ins co to see what ppi is covered         Other   Depression with anxiety    With acute grief reaction  Some improvement with inc in sertraline to 200 mg  Also hydroxyzine  Using ambien for sleep Time off work has helped  Ref for counseling today (she was not ready for group counseling right now) Reviewed  stressors/ coping techniques/symptoms/ support sources/ tx options and side effects in detail today No SI   Enc good self care  Inst to check in before whe returns to work in 2 wk      Relevant Orders   Ambulatory referral to Psychology   Grief reaction - Primary    Doing a little better with inc in sertraline, trial of hydroxyzine and good support  Also off work  Concentration is a little better   See a/p for dep/anx   Out of work for 2 more weeks Ref done for counseling      Relevant  Orders   Ambulatory referral to Psychology   Other Visit Diagnoses     Need for shingles vaccine       Relevant Orders   Zoster Recombinant (Shingrix ) (Completed)

## 2022-04-19 NOTE — Patient Instructions (Addendum)
Call your pharmacy  Get a list of covered alternatives to protonix  Proton pump inhbitor  Let me know   I will put a counseling referral in  If you don't get a call in 1-2 weeks let us know   Think about doing something for yourself   Keep up posted with how you are doing   Continue current medicines

## 2022-04-21 NOTE — Assessment & Plan Note (Signed)
Protonix no longer covered by her ins   Inst to check with pharm/ins co to see what ppi is covered

## 2022-04-21 NOTE — Assessment & Plan Note (Signed)
With acute grief reaction  Some improvement with inc in sertraline to 200 mg  Also hydroxyzine  Using ambien for sleep Time off work has helped  Ref for counseling today (she was not ready for group counseling right now) Reviewed stressors/ coping techniques/symptoms/ support sources/ tx options and side effects in detail today No SI   Enc good self care  Inst to check in before whe returns to work in 2 wk

## 2022-04-21 NOTE — Assessment & Plan Note (Signed)
Doing a little better with inc in sertraline, trial of hydroxyzine and good support  Also off work  Concentration is a little better   See a/p for dep/anx   Out of work for 2 more weeks Ref done for counseling

## 2022-04-26 ENCOUNTER — Encounter (INDEPENDENT_AMBULATORY_CARE_PROVIDER_SITE_OTHER): Payer: No Typology Code available for payment source | Admitting: Ophthalmology

## 2022-04-26 DIAGNOSIS — E113312 Type 2 diabetes mellitus with moderate nonproliferative diabetic retinopathy with macular edema, left eye: Secondary | ICD-10-CM | POA: Diagnosis not present

## 2022-04-30 ENCOUNTER — Encounter: Payer: Self-pay | Admitting: Family Medicine

## 2022-05-01 ENCOUNTER — Telehealth: Payer: Self-pay

## 2022-05-01 ENCOUNTER — Ambulatory Visit: Payer: No Typology Code available for payment source | Admitting: Behavioral Health

## 2022-05-01 DIAGNOSIS — Z0279 Encounter for issue of other medical certificate: Secondary | ICD-10-CM

## 2022-05-01 NOTE — Telephone Encounter (Signed)
Done and in IN box 

## 2022-05-01 NOTE — Progress Notes (Unsigned)
                Daylyn Christine L Michalene Debruler, LMFT 

## 2022-05-01 NOTE — Telephone Encounter (Signed)
Completed form has been faxed to fax# [phone].  Received fax confirmation.  Copies made for patient, scanning, billing, and myself.  Patient's copy given to Sherrilee Gilles for delivery per patient per request.  Sent msg to patient via mychart that this has been completed.

## 2022-05-01 NOTE — Telephone Encounter (Signed)
We received a short term disability form for patient.  This is due on 05/03/22.  Form placed in your inbox for completion and signing.

## 2022-05-03 ENCOUNTER — Ambulatory Visit (INDEPENDENT_AMBULATORY_CARE_PROVIDER_SITE_OTHER): Payer: No Typology Code available for payment source | Admitting: Behavioral Health

## 2022-05-03 DIAGNOSIS — F419 Anxiety disorder, unspecified: Secondary | ICD-10-CM | POA: Diagnosis not present

## 2022-05-03 DIAGNOSIS — F32A Depression, unspecified: Secondary | ICD-10-CM

## 2022-05-03 NOTE — Progress Notes (Signed)
                Chalmers Iddings L Julien Berryman, LMFT 

## 2022-05-03 NOTE — Progress Notes (Signed)
[facility] Counselor Initial Adult Exam  Name: ANSLIE WECKER Date: 05/03/2022 MRN: [id number] DOB: 02-Dec-1965 PCP: [name], MD  Time spent: 60 min Caregility video: P is in her car in private & Provider working remote at [place]:  Self    Paperwork requested: No   Reason for Visit /Presenting Problem: Elevated anx/dep & stressors due to Family issues  Mental Status Exam: Appearance:   Casual     Behavior:  Appropriate and Sharing  Motor:  Normal  Speech/Language:   Clear and Coherent  Affect:  Appropriate  Mood:  normal  Thought process:  normal  Thought content:    WNL  Sensory/Perceptual disturbances:    WNL  Orientation:  oriented to person, place, and time/date  Attention:  Good  Concentration:  Good  Memory:  WNL  Fund of knowledge:   Good  Insight:    Good  Judgment:   Good  Impulse Control:  Good    Risk Assessment: Danger to Self:  No Self-injurious Behavior: No Danger to Others: No Duty to Warn:no Physical Aggression / Violence:No  Access to Firearms a concern: No  Gang Involvement:No  Patient / guardian was educated about steps to take if suicide or homicide risk level increases between visits: yes While future psychiatric events cannot be accurately predicted, the patient does not currently require acute inpatient psychiatric care and does not currently meet [facility] involuntary commitment criteria.  Substance Abuse History: Current substance abuse: No     Past Psychiatric History:   No previous psychological problems have been observed Outpatient Providers: Loura Pardon, MD History of Psych Hospitalization: No  Psychological Testing:  NA    Abuse History:  Victim of: No.,  NA    Report needed: No. Victim of Neglect:No. Perpetrator of  NA   Witness / Exposure to Domestic Violence: No   Protective Services Involvement: No  Witness to [employer] Violence:  No   Family History:  Family History   Problem Relation Age of Onset   Arthritis Mother        osteoarthritis   Asthma Mother    Hypertension Mother    Multiple sclerosis Mother    Cancer Father        colon cancer   Arthritis Father        osteoarthritis   Colon cancer Father    Colon polyps Father    Transient ischemic attack Father    COPD Father    Heart disease Maternal Grandfather    Ovarian cancer Maternal Aunt    Diabetes Paternal Aunt    Diabetes Paternal Uncle    Stomach cancer Neg Hx    Esophageal cancer Neg Hx     Living situation: the patient lives with their family  Sexual Orientation: Straight  Relationship Status: married  Name of spouse / other:David; married 53 yrs; Son Montine Circle & Dtr Maudie Mercury who has 4 Kids If a parent, number of children / ages:Multiple children  Support Systems: spouse significant other friends parents  Financial Stress:  No   Income/Employment/Disability: Employment  Armed forces logistics/support/administrative officer: No   Educational History: Education: Audiological scientist  Religion/Sprituality/World View: Unk  Any cultural differences that may affect / interfere with treatment:  Unk  Recreation/Hobbies: unk  Stressors: Loss of Son    Strengths: Supportive Relationships, Family, Friends, Church, Spirituality, Hopefulness, and Self Advocate  Barriers:  None noted today   Legal History: Pending legal issue / charges: The patient has no significant history  of legal issues. History of legal issue / charges:  NA  Medical History/Surgical History: reviewed Past Medical History:  Diagnosis Date   Allergy    SEASONAL   Anemia    iron deficiency   Anxiety    Arthritis    Asthma    a. 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   Cellulitis and abscess of leg 02/2014   CHF (congestive heart failure) ([place])    Chronic kidney disease    [id number]   Complication of anesthesia    DIFFICULT WAKING- 2011   Depression    a. Stress reaction 08/2011 in multiple social  stressors.   Diabetes mellitus    Dysrhythmia    Gastritis    GERD (gastroesophageal reflux disease)    Heart murmur    Hemorrhoid    Hyperlipidemia    Hypertension    Hyperthyroidism    Insomnia    Morbid obesity (HCC)    OSA (obstructive sleep apnea) 01/17/2012   Papillary thyroid carcinoma (HCC)    PCOS (polycystic ovarian syndrome)    Sleep apnea    C PAP   Thyroid cancer ([place]) 2014   Vertigo     Past Surgical History:  Procedure Laterality Date   CATARACT EXTRACTION Left 04/13/2020   CATARACT EXTRACTION Right 06/01/2020   CHOLECYSTECTOMY     HEMORRHOID SURGERY     INTRAUTERINE DEVICE INSERTION  03/25/2009   SHOULDER SURGERY  06/23/2009   THYROIDECTOMY  04/10/2012   Procedure: THYROIDECTOMY;  Surgeon: Jerrell Belfast, MD;  Location: [facility] OR;  Service: ENT;  Laterality: N/A;  Total Thyroidectomy   TONSILLECTOMY AND ADENOIDECTOMY  CHILD   TUBAL LIGATION      Medications: Current Outpatient Medications  Medication Sig Dispense Refill   Albuterol Sulfate (PROAIR RESPICLICK) 123XX123 (90 Base) MCG/ACT AEPB Inhale 2 puffs into the lungs every 6 (six) hours as needed. 1 each 2   buPROPion (WELLBUTRIN XL) 150 MG 24 hr tablet TAKE 1 TABLET BY MOUTH EVERY DAY 90 tablet 0   carvedilol (COREG) 6.25 MG tablet TAKE 1 TABLET BY MOUTH 2 TIMES DAILY WITH A MEAL. 180 tablet 0   cetirizine (ZYRTEC) 10 MG tablet TAKE 1 TABLET BY MOUTH EVERY DAY 90 tablet 1   cholecalciferol (VITAMIN D3) 25 MCG (1000 UNIT) tablet Take 2,000 Units by mouth daily.     cholestyramine (QUESTRAN) 4 g packet Take 1 packet (4 g total) by mouth daily. 30 each 6   Continuous Blood Gluc Sensor (FREESTYLE LIBRE 3 SENSOR) MISC 1 each by Does not apply route every 14 (fourteen) days. 6 each 3   cyanocobalamin (VITAMIN B12) 1000 MCG tablet Take 1,000 mcg by mouth daily.     empagliflozin (JARDIANCE) 25 MG TABS tablet Take 1 tablet (25 mg total) by mouth daily before breakfast. 90 tablet 3   ENTRESTO 49-51 MG TAKE 1 TABLET  BY MOUTH TWICE A DAY 60 tablet 3   ferrous sulfate 325 (65 FE) MG tablet Take 325 mg by mouth 2 (two) times daily.       fluticasone (FLONASE) 50 MCG/ACT nasal spray Place 2 sprays into both nostrils daily as needed for allergies. 16 g 11   glucose blood (ONE TOUCH ULTRA TEST) test strip Use 2x a day 100 each 2   hydrOXYzine (VISTARIL) 25 MG capsule Take 1 capsule (25 mg total) by mouth every 8 (eight) hours as needed for anxiety or itching (insomnia). 30 capsule 1   insulin glargine,  1 Unit Dial, (TOUJEO SOLOSTAR) 300 UNIT/ML Solostar Pen Inject 90 Units into the skin at bedtime. 18 mL 3   insulin lispro (HUMALOG KWIKPEN) 200 UNIT/ML KwikPen Inject 10-20 Units into the skin 3 (three) times daily before meals. 30 mL 3   Insulin Pen Needle 32G X 4 MM MISC Use 5x a day 400 each 3   levothyroxine (SYNTHROID) 175 MCG tablet Take 1 tablet daily in am, fasting 90 tablet 3   metFORMIN (GLUCOPHAGE) 1000 MG tablet Take 1 tablet (1,000 mg total) by mouth 2 (two) times daily with a meal. 180 tablet 3   Na Sulfate-K Sulfate-Mg Sulf (SUPREP BOWEL PREP KIT) 17.5-3.13-1.6 GM/177ML SOLN Take 1 kit by mouth as directed. 324 mL 0   pantoprazole (PROTONIX) 40 MG tablet TAKE 1 TABLET BY MOUTH EVERY DAY 90 tablet 1   potassium chloride (KLOR-CON) 20 MEQ packet Take 20 mEq by mouth as needed.     rosuvastatin (CRESTOR) 20 MG tablet Take 1 tablet (20 mg total) by mouth daily. 90 tablet 3   sertraline (ZOLOFT) 100 MG tablet Take 2 tablets (200 mg total) by mouth at bedtime. 180 tablet 1   spironolactone (ALDACTONE) 25 MG tablet TAKE 1 TABLET (25 MG TOTAL) BY MOUTH DAILY. 90 tablet 3   torsemide (DEMADEX) 20 MG tablet Take 1 tablet (20 mg total) by mouth daily as needed. 30 tablet 6   TRAVATAN Z 0.004 % SOLN ophthalmic solution Place 1 drop into both eyes at bedtime.      zolpidem (AMBIEN) 10 MG tablet TAKE 1/2 TO 1 TABLET BY MOUTH AT BEDTIME AS NEEDED 30 tablet 3   No current facility-administered medications for this  visit.    Allergies  Allergen Reactions   Tape Rash    Plastic tape   Buspirone Hcl Nausea Only   Glipizide     REACTION: sleepy, low sugar   Ivp Dye [Iodinated Contrast Media]     Shut kidneys down   Oxycodone-Acetaminophen Itching    Diagnoses:  Anxiety and depression  Plan of Care: Takiesha realizes that grief is random & it hits her randomly. She will approach these instances as an incident of depression, & she will use this as a means to use 988.  Target Date: 05/2022/  Progress: 4  Frequency: Twice monthly  Modality: [name], LMFT

## 2022-05-09 ENCOUNTER — Other Ambulatory Visit: Payer: Self-pay | Admitting: Internal Medicine

## 2022-05-27 ENCOUNTER — Other Ambulatory Visit: Payer: Self-pay | Admitting: Family Medicine

## 2022-05-27 ENCOUNTER — Encounter (INDEPENDENT_AMBULATORY_CARE_PROVIDER_SITE_OTHER): Payer: No Typology Code available for payment source | Admitting: Ophthalmology

## 2022-05-27 ENCOUNTER — Other Ambulatory Visit: Payer: Self-pay | Admitting: Internal Medicine

## 2022-05-27 DIAGNOSIS — E113312 Type 2 diabetes mellitus with moderate nonproliferative diabetic retinopathy with macular edema, left eye: Secondary | ICD-10-CM | POA: Diagnosis not present

## 2022-05-27 DIAGNOSIS — E113391 Type 2 diabetes mellitus with moderate nonproliferative diabetic retinopathy without macular edema, right eye: Secondary | ICD-10-CM | POA: Diagnosis not present

## 2022-05-27 DIAGNOSIS — I10 Essential (primary) hypertension: Secondary | ICD-10-CM | POA: Diagnosis not present

## 2022-05-27 DIAGNOSIS — H43813 Vitreous degeneration, bilateral: Secondary | ICD-10-CM

## 2022-05-27 DIAGNOSIS — H35033 Hypertensive retinopathy, bilateral: Secondary | ICD-10-CM | POA: Diagnosis not present

## 2022-05-28 ENCOUNTER — Ambulatory Visit: Payer: No Typology Code available for payment source | Admitting: Behavioral Health

## 2022-05-28 ENCOUNTER — Encounter: Payer: Self-pay | Admitting: Family Medicine

## 2022-05-28 DIAGNOSIS — F419 Anxiety disorder, unspecified: Secondary | ICD-10-CM

## 2022-05-28 DIAGNOSIS — F32A Depression, unspecified: Secondary | ICD-10-CM

## 2022-05-28 NOTE — Telephone Encounter (Signed)
Ambien last filled 01-17-22 #30 Hydroxyzine last filled 04-05-22 #30 Last OV 04-19-22 No Future OV [facility]

## 2022-05-28 NOTE — Progress Notes (Signed)
                Tionne Carelli L Kimora Stankovic, LMFT 

## 2022-05-28 NOTE — Progress Notes (Signed)
[facility] Counselor/Therapist Progress Note  Patient ID: [name], MRN: [id number],    Date: 05/28/2022  Time Spent: 78 min Caregility video; Pt is home in private & Provider working remotely from [place] [facility]   Treatment Type: Individual Therapy  Reported Symptoms: Elevated anx/dep & stress in her grieving process  Mental Status Exam: Appearance:  Casual     Behavior: Appropriate and Sharing  Motor: Normal  Speech/Language:  Clear and Coherent  Affect: Appropriate  Mood: normal  Thought process: normal  Thought content:   WNL  Sensory/Perceptual disturbances:   WNL  Orientation: oriented to person, place, and time/date  Attention: Good  Concentration: Good  Memory: WNL  Fund of knowledge:  Good  Insight:   Good  Judgment:  Good  Impulse Control: Good   Risk Assessment: Danger to Self:  No Self-injurious Behavior: No Danger to Others: No Duty to Warn:no Physical Aggression / Violence:No  Access to Firearms a concern: No  Gang Involvement:No   Subjective: Pt is crying in her grief today & recounting her Son's life.  Pt has her Son on her mind & speaks w/him when she remembers him @ specific times. She is considering a special commemoration for her 57yo Son [name] who died in 05-Jun-2021.   Interventions: Grief Therapy  Diagnosis:Anxiety and depression  Plan: [name] is carefully planning her Son's Bday on 06/07/2022. She & her Family will do something special. It will include the [place], [name], [name] & possible his Wife [name].  Target Date: 06/23/2022  Progress: 5  Frequency: Twice monthly  Modality: [name], LMFT

## 2022-06-08 ENCOUNTER — Other Ambulatory Visit: Payer: Self-pay | Admitting: Internal Medicine

## 2022-06-14 ENCOUNTER — Ambulatory Visit (INDEPENDENT_AMBULATORY_CARE_PROVIDER_SITE_OTHER): Payer: No Typology Code available for payment source | Admitting: Behavioral Health

## 2022-06-14 DIAGNOSIS — F32A Anxiety disorder, unspecified: Secondary | ICD-10-CM

## 2022-06-14 DIAGNOSIS — F4321 Adjustment disorder with depressed mood: Secondary | ICD-10-CM

## 2022-06-14 DIAGNOSIS — F419 Anxiety disorder, unspecified: Secondary | ICD-10-CM | POA: Diagnosis not present

## 2022-06-14 DIAGNOSIS — F4320 Adjustment disorder, unspecified: Secondary | ICD-10-CM

## 2022-06-14 NOTE — Progress Notes (Signed)
                Demarri Elie L Bowie Delia, LMFT 

## 2022-06-14 NOTE — Progress Notes (Signed)
[facility] Counselor/Therapist Progress Note  Patient ID: [name], MRN: [id number],    Date: 06/14/2022  Time Spent: 71 min Caregility video; Pt is home in private & Provider is working remote from [employer]   Treatment Type: Individual Therapy  Reported Symptoms: Elevated anx/dep & stress due to recent Bday of Son [name] who died in 06-25-2021.   Mental Status Exam: Appearance:  Casual     Behavior: Appropriate and Sharing  Motor: Normal  Speech/Language:  Clear and Coherent  Affect: Appropriate  Mood: anxious and sad  Thought process: normal  Thought content:   WNL  Sensory/Perceptual disturbances:   WNL  Orientation: oriented to person, place, and time/date  Attention: Good  Concentration: Good  Memory: WNL  Fund of knowledge:  Good  Insight:   Good  Judgment:  Good  Impulse Control: Good   Risk Assessment: Danger to Self:  No Self-injurious Behavior: No Danger to Others: No Duty to Warn:no Physical Aggression / Violence:No  Access to Firearms a concern: No  Gang Involvement:No   Subjective: Pt is deeply grieving her 75yo old Son's death. His recent Bday on June 09, 2022 w/a graveside celebration w/20 ppl. They shared stories about baseball & football days & his humor & funny beh.   Interventions: Solution-Oriented/Positive Psychology  Diagnosis:Anxiety and depression  Grief reaction  Plan: [name] will take a few extra pics for her desk @ work when she RTW on Jun 26, 2022. She will search for an object to take also to keep her focused on 2022-06-26. [name] will speak w/her immed [name] to request a desk by a window. She will try to make other requests to [facility]. She may need to leave early if she gets upset.   Target Date: 07/15/2022  Progress: 5  Frequency: Twice monthly  Modality: Monica Reaper, LMFT

## 2022-06-14 NOTE — Telephone Encounter (Signed)
Patient is scheduled for an appointment on 07/01/22 - is requesting refill for Levothyroxine

## 2022-06-17 ENCOUNTER — Other Ambulatory Visit: Payer: Self-pay | Admitting: Internal Medicine

## 2022-06-24 ENCOUNTER — Encounter (INDEPENDENT_AMBULATORY_CARE_PROVIDER_SITE_OTHER): Payer: No Typology Code available for payment source | Admitting: Ophthalmology

## 2022-07-01 ENCOUNTER — Ambulatory Visit: Payer: No Typology Code available for payment source | Admitting: Internal Medicine

## 2022-07-01 ENCOUNTER — Encounter: Payer: Self-pay | Admitting: Internal Medicine

## 2022-07-01 VITALS — BP 130/88 | HR 65 | Ht 65.0 in | Wt 230.8 lb

## 2022-07-01 DIAGNOSIS — E1142 Type 2 diabetes mellitus with diabetic polyneuropathy: Secondary | ICD-10-CM | POA: Diagnosis not present

## 2022-07-01 DIAGNOSIS — Z91199 Patient's noncompliance with other medical treatment and regimen due to unspecified reason: Secondary | ICD-10-CM | POA: Diagnosis not present

## 2022-07-01 DIAGNOSIS — E1165 Type 2 diabetes mellitus with hyperglycemia: Secondary | ICD-10-CM | POA: Diagnosis not present

## 2022-07-01 DIAGNOSIS — C73 Malignant neoplasm of thyroid gland: Secondary | ICD-10-CM | POA: Diagnosis not present

## 2022-07-01 DIAGNOSIS — E890 Postprocedural hypothyroidism: Secondary | ICD-10-CM

## 2022-07-01 LAB — POCT GLYCOSYLATED HEMOGLOBIN (HGB A1C): Hemoglobin A1C: 13 % — AB (ref 4.0–5.6)

## 2022-07-01 MED ORDER — INSULIN PEN NEEDLE 32G X 4 MM MISC: 3 refills | Status: DC

## 2022-07-01 MED ORDER — TOUJEO SOLOSTAR 300 UNIT/ML ~~LOC~~ SOPN: 90.0000 [IU] | PEN_INJECTOR | Freq: Every day | SUBCUTANEOUS | 3 refills | Status: DC

## 2022-07-01 MED ORDER — EMPAGLIFLOZIN 25 MG PO TABS: 25.0000 mg | ORAL_TABLET | Freq: Every day | ORAL | 3 refills | Status: AC

## 2022-07-01 MED ORDER — LEVOTHYROXINE SODIUM 175 MCG PO TABS: ORAL_TABLET | ORAL | 3 refills | Status: DC

## 2022-07-01 MED ORDER — METFORMIN HCL 1000 MG PO TABS: 1000.0000 mg | ORAL_TABLET | Freq: Two times a day (BID) | ORAL | 3 refills | Status: DC

## 2022-07-01 MED ORDER — HUMALOG KWIKPEN 200 UNIT/ML ~~LOC~~ SOPN: 10.0000 [IU] | PEN_INJECTOR | Freq: Three times a day (TID) | SUBCUTANEOUS | 3 refills | Status: DC

## 2022-07-01 MED ORDER — FREESTYLE LIBRE 3 SENSOR MISC: 1.0000 | 3 refills | Status: DC

## 2022-07-01 NOTE — Patient Instructions (Addendum)
Please continue: - Metformin 1000 mg 2x a day - Jardiance 25 mg before b'fast  Restart: - Toujeo 45 x 2 injections at night - Humalog 15-20 units 15 min before a meal but 10 units before a snack   Please also continue: - Levothyroxine 175 mcg daily.   Take the thyroid hormone every day with water, at least 30 minutes before breakfast, separated by at least 4 hours from: - acid reflux medications - calcium - iron - multivitamins   Please return in 3 to 4 months.

## 2022-07-01 NOTE — Progress Notes (Signed)
Patient ID: [name], female   DOB: 10/24/1965, 57 y.o.   MRN: [id number]  HPI: [name] is a 57 y.o.-year-old female, returning for f/u for DM2, dx 2008, insulin-dependent since ~2011, uncontrolled, with complications (peripheral neuropathy, dCHF), h/o papillary thyroid cancer and iatrogenic hypothyroidism.  At today's visit, she returns after another long absence of more than a year.  Interim history: At this visit, she has no increased urination, blurry vision, nausea, chest pain.  She lost her son in 02/2022.  She is grieving. She returned to work 2 weeks ago.  He is crying during the appointment.  DM2: Reviewed HbA1c levels: Lab Results  Component Value Date   HGBA1C 9.1 (A) 06/11/2021   HGBA1C 10.8 (A) 12/09/2019   HGBA1C 10.2 (A) 11/27/2018    She is on: - Toujeo 45 x 2 injections at night >> off since 02/2021 >> restarted 03/2021 >> Off now - Metformin 1000 mg 2x a day  >> off since 02/2021  >> restarted 03/2021 - Invokana 300 mg in a.m. >> Jardiance 25 mg before b'fast - Humalog  >> stopped 2/2 nausea, diarrhea (?) >>  Restarted 03/2021: 15-20 units 3x a day 15 min before meals You may need 5-10 units of Humalog around 8 am even if you do not drink coffee or eat b'fast. She was also on Glipizide but developed hypoglycemia >> loss of consciousness. She was also on Amaryl >> taken off.  She was on Ozempic- started 08/2017 >> restarted >> stopped 2/2 nausea in 2020  She checks sugars >4x a day with her Libre CGM:  Previously:   Previously:   Pt's meals are: - Breakfast: skips - 10 o'clock: banana, apple, nectarine - Lunch: fast food - Dinner: goes out to eat around 8 o'clock: chicken sandwich or a wrap - Snacks: 0, no concentrated sweets  -No CKD, last BUN/creatinine:  Lab Results  Component Value Date   BUN 17 01/15/2022   CREATININE 0.68 01/15/2022  Not on ACE inhibitor/ARB  -+ HL; last set of lipids: Lab Results  Component Value Date   CHOL  168 01/15/2022   HDL 38.40 (L) 01/15/2022   LDLCALC 127 (H) 11/24/2020   LDLDIRECT 97.0 01/15/2022   TRIG 341.0 (H) 01/15/2022   CHOLHDL 4 01/15/2022  On Crestor 10.  - last eye exam was in 01/2022: + DR reportedly ([facility], but also sent to Dr. Ashley Royalty - had 2 IO inj), + glaucoma. [facility].Had intraocular lens transplant B in 2022.  - she has Numbness and tingling in her feet.  Latest foot exam in 11/2020.  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  Not have to have RAI treatment  Neck U/S (11/24/2014): Post thyroidectomy.  No suspicious lymphadenopathy or soft tissue.  Neck U/S (10/07/2017): Post total thyroidectomy without evidence residual, locally recurrent or metastatic disease.  Pt restarted levothyroxine 175 mcg daily, taken: - At last visit she was taking it when you did not work but now in a pillbox - in am - fasting - at least 30 min from b'fast - no calcium - + iron at lunchtime and at night - no multivitamins - + PPIs at lunchtime - off Biotin - not on Questran  She is not compliant with doses.  Her TFTs are fluctuating. Lab Results  Component Value Date   TSH  4.48 01/15/2022   TSH 0.81 06/11/2021   TSH 4.49 11/24/2020   TSH 23.95 (H) 09/29/2019   TSH 20.95 (H) 08/14/2018   TSH 2.011 12/15/2017   TSH 6.32 (H) 08/01/2017   TSH 2.83 05/23/2017   TSH 14.73 (H) 07/26/2016   TSH 9.17 (A) 04/02/2016   Pt denies: - feeling nodules in neck - hoarseness - choking But has chronic dysphagia.  ROS: + see HPI  I reviewed pt's medications, allergies, PMH, social hx, family hx, and changes were documented in the history of present illness. Otherwise, unchanged from my initial visit note.  Past Medical History:  Diagnosis Date   Allergy    SEASONAL   Anemia     iron deficiency   Anxiety    Arthritis    Asthma    a. 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   Cellulitis and abscess of leg 02/2014   CHF (congestive heart failure) (HCC)    Chronic kidney disease    /13   Complication of anesthesia    DIFFICULT WAKING- 2011   Depression    a. Stress reaction 08/2011 in multiple social stressors.   Diabetes mellitus    Dysrhythmia    Gastritis    GERD (gastroesophageal reflux disease)    Heart murmur    Hemorrhoid    Hyperlipidemia    Hypertension    Hyperthyroidism    Insomnia    Morbid obesity (HCC)    OSA (obstructive sleep apnea) 01/17/2012   Papillary thyroid carcinoma (HCC)    PCOS (polycystic ovarian syndrome)    Sleep apnea    C PAP   Thyroid cancer (HCC) 2014   Vertigo    Past Surgical History:  Procedure Laterality Date   CATARACT EXTRACTION Left 04/13/2020   CATARACT EXTRACTION Right 06/01/2020   CHOLECYSTECTOMY     HEMORRHOID SURGERY     INTRAUTERINE DEVICE INSERTION  03/25/2009   SHOULDER SURGERY  06/23/2009   THYROIDECTOMY  04/10/2012   Procedure: THYROIDECTOMY;  Surgeon: Osborn Coho, MD;  [facility];  Service: ENT;  Laterality: N/A;  Total Thyroidectomy   TONSILLECTOMY AND ADENOIDECTOMY  CHILD   TUBAL LIGATION     Social History   Socioeconomic History   Marital status: Married    Spouse name: [name]   Number of children: 2   Years of education: Some college   Highest education level: Not on file  Occupational History   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  Tobacco Use   Smoking status: Former    Packs/day: 0.20    Years: 5.00    Additional pack years: 0.00    Total pack years: 1.00    Types: Cigarettes    Quit date: 03/25/2004    Years since quitting: 18.2   Smokeless tobacco: Never  Vaping Use   Vaping Use: Never used  Substance and Sexual Activity   Alcohol use: No    Alcohol/week: 0.0 standard drinks of alcohol   Drug use: No   Sexual  activity: Yes  Other Topics Concern   Not on file  Social History Narrative   Lives with son/husband   Caffeine use: daily   Right handed    Social Determinants of Health   Financial Resource Strain: Not on file  Food Insecurity: Not on file  Transportation Needs: Not on file  Physical Activity: Not on file  Stress: Not on file  Social Connections: Not on file  Intimate Partner Violence: Not on  file   Current Outpatient Medications on File Prior to Visit  Medication Sig Dispense Refill   Albuterol Sulfate (PROAIR RESPICLICK) 108 (90 Base) MCG/ACT AEPB Inhale 2 puffs into the lungs every 6 (six) hours as needed. 1 each 2   buPROPion (WELLBUTRIN XL) 150 MG 24 hr tablet TAKE 1 TABLET BY MOUTH EVERY DAY 90 tablet 0   carvedilol (COREG) 6.25 MG tablet TAKE 1 TABLET BY MOUTH 2 TIMES DAILY WITH A MEAL. 180 tablet 0   cetirizine (ZYRTEC) 10 MG tablet TAKE 1 TABLET BY MOUTH EVERY DAY 90 tablet 1   cholecalciferol (VITAMIN D3) 25 MCG (1000 UNIT) tablet Take 2,000 Units by mouth daily.     cholestyramine (QUESTRAN) 4 g packet Take 1 packet (4 g total) by mouth daily. 30 each 6   Continuous Blood Gluc Sensor (FREESTYLE LIBRE 3 SENSOR) MISC 1 each by Does not apply route every 14 (fourteen) days. 6 each 3   cyanocobalamin (VITAMIN B12) 1000 MCG tablet Take 1,000 mcg by mouth daily.     empagliflozin (JARDIANCE) 25 MG TABS tablet Take 1 tablet (25 mg total) by mouth daily before breakfast. 90 tablet 3   ENTRESTO 49-51 MG TAKE 1 TABLET BY MOUTH TWICE A DAY 60 tablet 3   ferrous sulfate 325 (65 FE) MG tablet Take 325 mg by mouth 2 (two) times daily.       fluticasone (FLONASE) 50 MCG/ACT nasal spray Place 2 sprays into both nostrils daily as needed for allergies. 16 g 11   glucose blood (ONE TOUCH ULTRA TEST) test strip Use 2x a day 100 each 2   hydrOXYzine (VISTARIL) 25 MG capsule TAKE 1 CAPSULE BY MOUTH EVERY 8 HOURS AS NEEDED FOR ANXIETY OR ITCHING (INSOMNIA). 30 capsule 1   insulin glargine, 1  Unit Dial, (TOUJEO SOLOSTAR) 300 UNIT/ML Solostar Pen Inject 90 Units into the skin at bedtime. 18 mL 3   insulin lispro (HUMALOG KWIKPEN) 200 UNIT/ML KwikPen Inject 10-20 Units into the skin 3 (three) times daily before meals. 30 mL 3   Insulin Pen Needle 32G X 4 MM MISC Use 5x a day 400 each 3   levothyroxine (SYNTHROID) 175 MCG tablet TAKE 1 TABLET BY MOUTH EVERY MORNING ON AN EMPTY STOMACH 30 tablet 0   metFORMIN (GLUCOPHAGE) 1000 MG tablet Take 1 tablet (1,000 mg total) by mouth 2 (two) times daily with a meal. 180 tablet 3   Na Sulfate-K Sulfate-Mg Sulf (SUPREP BOWEL PREP KIT) 17.5-3.13-1.6 GM/177ML SOLN Take 1 kit by mouth as directed. 324 mL 0   pantoprazole (PROTONIX) 40 MG tablet TAKE 1 TABLET BY MOUTH EVERY DAY 90 tablet 1   potassium chloride (KLOR-CON) 20 MEQ packet Take 20 mEq by mouth as needed.     rosuvastatin (CRESTOR) 20 MG tablet Take 1 tablet (20 mg total) by mouth daily. 90 tablet 3   sertraline (ZOLOFT) 100 MG tablet Take 2 tablets (200 mg total) by mouth at bedtime. 180 tablet 1   spironolactone (ALDACTONE) 25 MG tablet TAKE 1 TABLET (25 MG TOTAL) BY MOUTH DAILY. 90 tablet 3   torsemide (DEMADEX) 20 MG tablet Take 1 tablet (20 mg total) by mouth daily as needed. 30 tablet 6   TRAVATAN Z 0.004 % SOLN ophthalmic solution Place 1 drop into both eyes at bedtime.      zolpidem (AMBIEN) 10 MG tablet TAKE 1/2 TO 1 TABLET BY MOUTH AT BEDTIME AS NEEDED 30 tablet 3   No current facility-administered medications on file prior  to visit.   Allergies  Allergen Reactions   Tape Rash    Plastic tape   Buspirone Hcl Nausea Only   Glipizide     REACTION: sleepy, low sugar   Ivp Dye [Iodinated Contrast Media]     Shut kidneys down   Oxycodone-Acetaminophen Itching   Family History  Problem Relation Age of Onset   Arthritis Mother        osteoarthritis   Asthma Mother    Hypertension Mother    Multiple sclerosis Mother    Cancer Father        colon cancer   Arthritis Father         osteoarthritis   Colon cancer Father    Colon polyps Father    Transient ischemic attack Father    COPD Father    Heart disease Maternal Grandfather    Ovarian cancer Maternal Aunt    Diabetes Paternal Aunt    Diabetes Paternal Uncle    Stomach cancer Neg Hx    Esophageal cancer Neg Hx    PE: Ht 5\' 5"  (1.651 m)   Wt 230 lb 12.8 oz (104.7 kg)   BMI 38.41 kg/m   Wt Readings from Last 3 Encounters:  07/01/22 230 lb 12.8 oz (104.7 kg)  04/19/22 235 lb 4 oz (106.7 kg)  04/05/22 243 lb 6 oz (110.4 kg)   Constitutional: overweight, in NAD Eyes:  EOMI, no exophthalmos ENT: no neck masses, no cervical lymphadenopathy Cardiovascular: RRR, No MRG Respiratory: CTA B Musculoskeletal: no deformities Skin:no rashes Neurological: no tremor with outstretched hands Diabetic Foot Exam - Simple   Simple Foot Form Diabetic Foot exam was performed with the following findings: Yes 07/01/2022  3:11 PM  Visual Inspection No deformities, no ulcerations, no other skin breakdown bilaterally: Yes Sensation Testing Intact to touch and monofilament testing bilaterally: Yes Pulse Check Posterior Tibialis and Dorsalis pulse intact bilaterally: Yes Comments    ASSESSMENT: 1. DM2, insulin-dependent, uncontrolled, without long-term complications but with hyperglycemia  2. Iatrogenic Hypothyroidism - h/o med noncompliance  3. FPTC -Subcentimeter -No RAI treatment was necessary  4.  Noncompliance with the treatment plan.  PLAN:  1. And 4. Patient with history of uncontrolled type 2 diabetes, on basal/bolus insulin regimen, metformin, and SGLT2 inhibitor, with history of noncompliance with medications and visits.  She initially stopped Ozempic due to GI symptoms in 2021 and afterwards sugars increased to 300-400s, prompting starting Humalog.  At that time, we discussed about improving compliance with medications, blood sugar checks and visits.  I did advise her in the past that if she did not  come for follow-up appointments and not taking the medications as prescribed, I would not be able to help her.  Despite this, she returned in 03/2021, after 1 year and 3 months, off her medicines and with very high blood sugars.  I could not check an HbA1c at that time due to the virtual nature of the appointment.  I again advised her at that time that if she did not come for follow-up and take her medicines as prescribed, unfortunately, I need to discharge her from the practice. -At last visit, after restarting her medicines there was tremendous improvement in her blood sugars, with the majority of the blood sugars at goal so we did not change her regimen but I advised her to look into the CeQur simplicity mechanical insulin pump to help with injection burden -At today's visit, she returns after more than 1 year since last visit.  However, she lost her son in 02/2022.  At this visit, she is grieving -I will give her another chance to return to clinic as recommended. CGM interpretation: -At today's visit, we reviewed her CGM downloads: It appears that 0% of values are in target range (goal >70%), while 100% are higher than 180 (goal <25%).  The calculated average blood sugar is 371.  The projected HbA1c for the next 3 months (GMI) is 12.2%. -Reviewing the CGM trends, sugars appear to be well above target, barely visible on the GMI window -see HPI. -Due to reading, she was out of her insulin and she will use metformin and Jardiance.  Will go ahead and restart her insulin.  I advised her that if she is not eating (she does not have an appetite) she still needs to take her Toujeo and if she is eating small meals, she needs to take a smaller amount of Humalog. - I suggested to:  Patient Instructions  Please continue: - Metformin 1000 mg 2x a day - Jardiance 25 mg before b'fast  Restart: - Toujeo 45 x 2 injections at night - Humalog 15-20 units 15 min before a meal but 10 units before a snack   Please  also continue: - Levothyroxine 175 mcg daily.   Take the thyroid hormone every day with water, at least 30 minutes before breakfast, separated by at least 4 hours from: - acid reflux medications - calcium - iron - multivitamins   Please return in 3 to 4 months.  - we checked her HbA1c: 13% (much higher) - advised to check sugars at different times of the day - 4x a day, rotating check times - advised for yearly eye exams >> she is UTD - RTC in 3-4 mo  2. Iatrogenic Hypothyroidism -Uncontrolled due to history of incomplete compliance with her levothyroxine in the past.  At last visit she was off the medication completely for 1.5 months!  We discussed that this is unacceptable and advised her about the risks of hypothyroidism. -I advised her to move the medication on the nightstand and take it as soon as she woke up.  However, before last visit, she got a pillbox and she is taking this in the morning along with Jardiance.  She was previously taking it when she was getting to work. - latest thyroid labs reviewed with pt. >> normal but higher in the target range: Lab Results  Component Value Date   TSH 4.48 01/15/2022  - she continues on LT4 175 mcg daily - pt feels good on this dose. - we discussed about taking the thyroid hormone every day, with water, >30 minutes before breakfast, separated by >4 hours from acid reflux medications, calcium, iron, multivitamins. Pt. is taking it correctly, but missed doses.  We discussed about taking them consistently. -Will recheck her TFTs at next visit  3. Follicular PTC -Reviewed her neck ultrasound reports from 11/2014 and 09/2017: No recurrence -She has chronic dysphagia but no neck compression symptoms or nodules felt on palpation of her neck today  Carlus Pavlov, MD PhD [facility]

## 2022-07-09 ENCOUNTER — Encounter: Payer: Self-pay | Admitting: Family Medicine

## 2022-07-09 NOTE — Telephone Encounter (Signed)
Example letter printed and placed in your inbox for review

## 2022-07-09 NOTE — Telephone Encounter (Signed)
Please schedule an appt and we will work on letter during that time

## 2022-07-10 NOTE — Telephone Encounter (Signed)
Lvmtcb and sched  

## 2022-07-12 ENCOUNTER — Encounter: Payer: Self-pay | Admitting: Family Medicine

## 2022-07-12 ENCOUNTER — Ambulatory Visit: Payer: No Typology Code available for payment source | Admitting: Family Medicine

## 2022-07-12 ENCOUNTER — Encounter (INDEPENDENT_AMBULATORY_CARE_PROVIDER_SITE_OTHER): Payer: No Typology Code available for payment source | Admitting: Ophthalmology

## 2022-07-12 ENCOUNTER — Ambulatory Visit (INDEPENDENT_AMBULATORY_CARE_PROVIDER_SITE_OTHER): Payer: No Typology Code available for payment source | Admitting: Behavioral Health

## 2022-07-12 VITALS — BP 120/62 | HR 65 | Temp 98.3°F | Ht 65.0 in | Wt 225.0 lb

## 2022-07-12 DIAGNOSIS — I10 Essential (primary) hypertension: Secondary | ICD-10-CM | POA: Diagnosis not present

## 2022-07-12 DIAGNOSIS — G4709 Other insomnia: Secondary | ICD-10-CM

## 2022-07-12 DIAGNOSIS — F4321 Adjustment disorder with depressed mood: Secondary | ICD-10-CM | POA: Diagnosis not present

## 2022-07-12 DIAGNOSIS — F32A Depression, unspecified: Secondary | ICD-10-CM

## 2022-07-12 DIAGNOSIS — F418 Other specified anxiety disorders: Secondary | ICD-10-CM

## 2022-07-12 DIAGNOSIS — H35033 Hypertensive retinopathy, bilateral: Secondary | ICD-10-CM | POA: Diagnosis not present

## 2022-07-12 DIAGNOSIS — E113291 Type 2 diabetes mellitus with mild nonproliferative diabetic retinopathy without macular edema, right eye: Secondary | ICD-10-CM

## 2022-07-12 DIAGNOSIS — E113212 Type 2 diabetes mellitus with mild nonproliferative diabetic retinopathy with macular edema, left eye: Secondary | ICD-10-CM | POA: Diagnosis not present

## 2022-07-12 DIAGNOSIS — H43813 Vitreous degeneration, bilateral: Secondary | ICD-10-CM

## 2022-07-12 DIAGNOSIS — F419 Anxiety disorder, unspecified: Secondary | ICD-10-CM | POA: Diagnosis not present

## 2022-07-12 MED ORDER — HYDROXYZINE PAMOATE 25 MG PO CAPS: ORAL_CAPSULE | ORAL | 5 refills | Status: DC

## 2022-07-12 NOTE — Progress Notes (Signed)
                [name] L Monica Eduardo, LMFT 

## 2022-07-12 NOTE — Progress Notes (Signed)
[facility] Counselor/Therapist Progress Note  Patient ID: [name], MRN: [id number],    Date: 07/12/2022  Time Spent: 55 min Caregility video; Pt is home in private & Provider working remotely from [employer]   Treatment Type: Individual Therapy  Reported Symptoms: Elevated anx/dep & stress due to RTW & grief rxn due to Son Derrick's death in 18-Aug-2021  Mental Status Exam: Appearance:  Casual     Behavior: Appropriate and Sharing  Motor: Normal  Speech/Language:  Clear and Coherent  Affect: Appropriate  Mood: anxious, sad  Thought process: normal  Thought content:   WNL  Sensory/Perceptual disturbances:   WNL  Orientation: oriented to person, place, and time/date  Attention: Good  Concentration: Good  Memory: WNL  Fund of knowledge:  Good  Insight:   Good  Judgment:  Good  Impulse Control: Good   Risk Assessment: Danger to Self:  No Self-injurious Behavior: No Danger to Others: No Duty to Warn:no Physical Aggression / Violence:No  Access to Firearms a concern: No  Gang Involvement:No   Subjective: Pt has RTW process a few wks. Christianne Borrow gave her one day & then bombarded her w/requests. She has been trying to be diligent & needs her Louann Sjogren to understand her emotionality.   Pt is trying to mind her health needs. Her sleep is less disturbed now, even w/her C-PAP machine.   Interventions: Family Systems  Diagnosis:Anxiety and depression  Grief reaction  Plan: Lexia is c/o the process of RTW. She will take a 10 min break every hour to get out from behind her desk & breathe fresh air. She is getting sleep for the most part & eating healthier. She has lost 48#. She will mind her eating habits along w/her Humolog.   Target Date: 08/22/2022  Progress: 5  Frequency: Once every 3-4 wks  Modality: Claretta Fraise, LMFT

## 2022-07-12 NOTE — Patient Instructions (Addendum)
Continue your conseling   Gradually work on self care   Continue your current medicines   Get outdoors  Exercise and do physical work as tolerated

## 2022-07-12 NOTE — Progress Notes (Unsigned)
Subjective:    Patient ID: [name], female    DOB: 08-08-1965, 57 y.o.   MRN: [id number]  HPI Pt presents for f/u of mood and grief reaction   Wt Readings from Last 3 Encounters:  07/12/22 225 lb (102.1 kg)  07/01/22 230 lb 12.8 oz (104.7 kg)  04/19/22 235 lb 4 oz (106.7 kg)   37.44 kg/m  Vitals:   07/12/22 1504  BP: 120/62  Pulse: 65  Temp: 98.3 F (36.8 C)  SpO2: 96%     Unexpectedly lost her Hodges in 02/2022   Per pt her short term disability program denied her claim for missed work bedause of brief reaction  She works for [employer]   Last day worked 04/03/22 Disability began 04/04/22     (they rec claim on 16th and then her benefit start date was supposed to be 1/25) Then denied - found this out recently (? Determination march 20th)    Actual return to work day was 06/17/22   Needs appeal / more documentation re how disabled she was   Short term disability criteria : unknown    Need to know how symptoms disabled her    Sertraline 200 mg daily Wellbutrin xl 150 mg daily  Hydroxyzine  Ambien for sleep   Counseling - has sessions iwht [place] [facility]    Challenges:   Grief symptoms- tearfulness / difficulty communicating and interacting  Kept her from doing any of her job resp  Severe anxiety - worry , restless, tearful  Caused poor short term memory  Could not leave the house or be around people  Heart palpitations  Hyperventilation  Gastro problems (weight loss)   Caused difficulty sitting/ , keying , communicating   Panic attacks - at her worse were daily , lasting a day at at time  She could only lay down with sheet over head   Depression - down/ lack of motivation   Impaired concentration - from off of the above   Had moments she did not want to live but no plans for self harm No hospitalization    She tried her very very best to work   Now - just finished first month back  Gets mind off things a  bit Very busy and playing catch up  Some tearful episodes- handles that by going outside for a few minutes/ takes occ breaks from desk   Still feels nervous and restless (worse at night)  No longer panicking or hyperventilating  Still getting appetite back    Takes hydroxyzine am and after lunch  Ambien at night   Still going to therapy  Had a visit today  Disc how it is going to take time  Thinks she is doing good   This has really been helpful   Patient Active Problem List   Diagnosis Date Noted   Papillary thyroid carcinoma 07/01/2022   Grief reaction 03/05/2022   Vitamin B12 deficiency 01/22/2022   Current use of proton pump inhibitor 11/24/2020   Colon cancer screening 11/24/2020   Vitamin D deficiency 09/27/2019   Lumbar radiculopathy 12/16/2018   Poorly controlled type 2 diabetes mellitus with peripheral neuropathy 12/16/2018   Bilateral carpal tunnel syndrome 11/24/2018   Family history of MS (multiple sclerosis) 09/29/2018   Acute sinusitis 07/05/2014   Routine general medical examination at a health care facility 12/19/2013   Palpitations 02/03/2013   Postsurgical hypothyroidism 04/28/2012   History of thyroid cancer 04/27/2012   Cushing's syndrome 04/27/2012  Chronic diastolic heart failure 02/10/2012   Diastolic CHF 01/25/2012   OSA (obstructive sleep apnea) just got cpap at home this past week 01/17/12 01/17/2012   Chronic allergic rhinitis 10/26/2010   Insomnia 10/26/2010   OBESITY, MORBID 04/27/2010   HEMORRHOIDS, INTERNAL, WITH BLEEDING 08/17/2009   GERD 04/27/2008   Essential hypertension 11/03/2007   Hyperlipidemia associated with type 2 diabetes mellitus 05/14/2007   OTHER ORGANIC SLEEP DISORDERS 03/09/2007   Uncontrolled type 2 diabetes mellitus with peripheral neuropathy 09/11/2006   POLYCYSTIC OVARIES 09/11/2006   Iron deficiency anemia 09/11/2006   Depression with anxiety 09/11/2006   EXTERNAL HEMORRHOIDS 09/11/2006   Asthma 09/11/2006    Past Medical History:  Diagnosis Date   Allergy    SEASONAL   Anemia    iron deficiency   Anxiety    Arthritis    Asthma    a. 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   Cellulitis and abscess of leg 02/2014   CHF (congestive heart failure)    Chronic kidney disease    /13   Complication of anesthesia    DIFFICULT WAKING- 2011   Depression    a. Stress reaction 08/2011 in multiple social stressors.   Diabetes mellitus    Dysrhythmia    Gastritis    GERD (gastroesophageal reflux disease)    Heart murmur    Hemorrhoid    Hyperlipidemia    Hypertension    Hyperthyroidism    Insomnia    Morbid obesity    OSA (obstructive sleep apnea) 01/17/2012   Papillary thyroid carcinoma    PCOS (polycystic ovarian syndrome)    Sleep apnea    C PAP   Thyroid cancer 2014   Vertigo    Past Surgical History:  Procedure Laterality Date   CATARACT EXTRACTION Left 04/13/2020   CATARACT EXTRACTION Right 06/01/2020   CHOLECYSTECTOMY     HEMORRHOID SURGERY     INTRAUTERINE DEVICE INSERTION  03/25/2009   SHOULDER SURGERY  06/23/2009   THYROIDECTOMY  04/10/2012   Procedure: THYROIDECTOMY;  Surgeon: Osborn Coho, MD;  Location: [facility] OR;  Service: ENT;  Laterality: N/A;  Total Thyroidectomy   TONSILLECTOMY AND ADENOIDECTOMY  CHILD   TUBAL LIGATION     Social History   Tobacco Use   Smoking status: Former    Packs/day: 0.20    Years: 5.00    Additional pack years: 0.00    Total pack years: 1.00    Types: Cigarettes    Quit date: 03/25/2004    Years since quitting: 18.3   Smokeless tobacco: Never  Vaping Use   Vaping Use: Never used  Substance Use Topics   Alcohol use: No    Alcohol/week: 0.0 standard drinks of alcohol   Drug use: No   Family History  Problem Relation Age of Onset   Arthritis Mother        osteoarthritis   Asthma Mother    Hypertension Mother    Multiple sclerosis Mother    Cancer Father        colon cancer    Arthritis Father        osteoarthritis   Colon cancer Father    Colon polyps Father    Transient ischemic attack Father    COPD Father    Heart disease Maternal Grandfather    Ovarian cancer Maternal Aunt    Diabetes Paternal Aunt    Diabetes Paternal Uncle    Stomach cancer Neg Hx  Esophageal cancer Neg Hx    Allergies  Allergen Reactions   Tape Rash    Plastic tape   Buspirone Hcl Nausea Only   Glipizide     REACTION: sleepy, low sugar   Ivp Dye [Iodinated Contrast Media]     Shut kidneys down   Oxycodone-Acetaminophen Itching   Current Outpatient Medications on File Prior to Visit  Medication Sig Dispense Refill   Albuterol Sulfate (PROAIR RESPICLICK) 108 (90 Base) MCG/ACT AEPB Inhale 2 puffs into the lungs every 6 (six) hours as needed. 1 each 2   buPROPion (WELLBUTRIN XL) 150 MG 24 hr tablet TAKE 1 TABLET BY MOUTH EVERY DAY 90 tablet 0   carvedilol (COREG) 6.25 MG tablet TAKE 1 TABLET BY MOUTH 2 TIMES DAILY WITH A MEAL. 180 tablet 0   cetirizine (ZYRTEC) 10 MG tablet TAKE 1 TABLET BY MOUTH EVERY DAY 90 tablet 1   cholecalciferol (VITAMIN D3) 25 MCG (1000 UNIT) tablet Take 2,000 Units by mouth daily.     Continuous Blood Gluc Sensor (FREESTYLE LIBRE 3 SENSOR) MISC 1 each by Does not apply route every 14 (fourteen) days. 6 each 3   cyanocobalamin (VITAMIN B12) 1000 MCG tablet Take 1,000 mcg by mouth daily.     empagliflozin (JARDIANCE) 25 MG TABS tablet Take 1 tablet (25 mg total) by mouth daily before breakfast. 90 tablet 3   ENTRESTO 49-51 MG TAKE 1 TABLET BY MOUTH TWICE A DAY 60 tablet 3   ferrous sulfate 325 (65 FE) MG tablet Take 325 mg by mouth 2 (two) times daily.       fluticasone (FLONASE) 50 MCG/ACT nasal spray Place 2 sprays into both nostrils daily as needed for allergies. 16 g 11   glucose blood (ONE TOUCH ULTRA TEST) test strip Use 2x a day 100 each 2   insulin glargine, 1 Unit Dial, (TOUJEO SOLOSTAR) 300 UNIT/ML Solostar Pen Inject 90 Units into the skin at  bedtime. 18 mL 3   insulin lispro (HUMALOG KWIKPEN) 200 UNIT/ML KwikPen Inject 10-20 Units into the skin 3 (three) times daily before meals. 30 mL 3   Insulin Pen Needle 32G X 4 MM MISC Use 5x a day 400 each 3   levothyroxine (SYNTHROID) 175 MCG tablet TAKE 1 TABLET BY MOUTH EVERY MORNING ON AN EMPTY STOMACH 90 tablet 3   metFORMIN (GLUCOPHAGE) 1000 MG tablet Take 1 tablet (1,000 mg total) by mouth 2 (two) times daily with a meal. 180 tablet 3   pantoprazole (PROTONIX) 40 MG tablet TAKE 1 TABLET BY MOUTH EVERY DAY 90 tablet 1   potassium chloride (KLOR-CON) 20 MEQ packet Take 20 mEq by mouth as needed.     rosuvastatin (CRESTOR) 20 MG tablet Take 1 tablet (20 mg total) by mouth daily. 90 tablet 3   sertraline (ZOLOFT) 100 MG tablet Take 2 tablets (200 mg total) by mouth at bedtime. 180 tablet 1   spironolactone (ALDACTONE) 25 MG tablet TAKE 1 TABLET (25 MG TOTAL) BY MOUTH DAILY. 90 tablet 3   torsemide (DEMADEX) 20 MG tablet Take 1 tablet (20 mg total) by mouth daily as needed. 30 tablet 6   TRAVATAN Z 0.004 % SOLN ophthalmic solution Place 1 drop into both eyes at bedtime.      zolpidem (AMBIEN) 10 MG tablet TAKE 1/2 TO 1 TABLET BY MOUTH AT BEDTIME AS NEEDED 30 tablet 3   Na Sulfate-K Sulfate-Mg Sulf (SUPREP BOWEL PREP KIT) 17.5-3.13-1.6 GM/177ML SOLN Take 1 kit by mouth as directed. (Patient not  taking: Reported on 07/12/2022) 324 mL 0   No current facility-administered medications on file prior to visit.      Review of Systems  Constitutional:  Positive for fatigue. Negative for activity change, appetite change, fever and unexpected weight change.  HENT:  Negative for congestion, ear pain, rhinorrhea, sinus pressure and sore throat.   Eyes:  Negative for pain, redness and visual disturbance.  Respiratory:  Negative for cough, shortness of breath and wheezing.   Cardiovascular:  Negative for chest pain and palpitations.  Gastrointestinal:  Negative for abdominal pain, blood in stool,  constipation and diarrhea.  Endocrine: Negative for polydipsia and polyuria.  Genitourinary:  Negative for dysuria, frequency and urgency.  Musculoskeletal:  Negative for arthralgias, back pain and myalgias.  Skin:  Negative for pallor and rash.  Allergic/Immunologic: Negative for environmental allergies.  Neurological:  Negative for dizziness, syncope and headaches.  Hematological:  Negative for adenopathy. Does not bruise/bleed easily.  Psychiatric/Behavioral:  Positive for dysphoric mood. Negative for decreased concentration. The patient is nervous/anxious.        Objective:   Physical Exam Constitutional:      General: She is not in acute distress.    Appearance: Normal appearance. She is well-developed. She is obese. She is not ill-appearing or diaphoretic.  HENT:     Head: Normocephalic and atraumatic.     Mouth/Throat:     Mouth: Mucous membranes are moist.  Eyes:     General: No scleral icterus.    Conjunctiva/sclera: Conjunctivae normal.     Pupils: Pupils are equal, round, and reactive to light.  Neck:     Thyroid: No thyromegaly.     Vascular: No carotid bruit or JVD.  Cardiovascular:     Rate and Rhythm: Normal rate and regular rhythm.     Heart sounds: Normal heart sounds.     No gallop.  Pulmonary:     Effort: Pulmonary effort is normal. No respiratory distress.     Breath sounds: Normal breath sounds. No stridor. No wheezing, rhonchi or rales.  Abdominal:     General: There is no distension or abdominal bruit.     Palpations: Abdomen is soft.  Musculoskeletal:     Cervical back: Normal range of motion and neck supple.     Right lower leg: No edema.     Left lower leg: No edema.  Lymphadenopathy:     Cervical: No cervical adenopathy.  Skin:    General: Skin is warm and dry.     Coloration: Skin is not pale.     Findings: No rash.  Neurological:     Mental Status: She is alert.     Coordination: Coordination normal.     Deep Tendon Reflexes: Reflexes  are normal and symmetric. Reflexes normal.  Psychiatric:        Attention and Perception: Attention normal.        Mood and Affect: Mood is anxious and depressed. Affect is not angry or inappropriate.        Behavior: Behavior normal.        Thought Content: Thought content is not paranoid or delusional. Thought content does not include suicidal ideation.        Cognition and Memory: Cognition and memory normal.     Comments: Occ tearful  Candidly discusses symptoms and stressors             Assessment & Plan:   Problem List Items Addressed This Visit  Other   Depression with anxiety    Grief reaction -overall doing some better and back to work  Still finds challenges in terms of concentration and sleep Much less panic / no more episodes of hyperventilation Reviewed stressors/ coping techniques/symptoms/ support sources/ tx options and side effects in detail today Plan to continue  Sertraline 200 m gdaily  Wellbutrin xl 150 mg daily  Hydroxyzine prn Ambien for sleep  Continues counseling which has been helpful        Relevant Medications   hydrOXYzine (VISTARIL) 25 MG capsule   Grief reaction - Primary    Pt lost her Hodges unexpectedly in 02/2022  She missed work from 04/03/22 to 06/17/22 and is slowly improving  We discussed her short term disability denial for that time frame, which has put a financial strain on her  Continues current medicines (see a/p for dep and anxiety)   At the time of her leave, due to severe anxiety and depression symptoms with panic attacks, she could not work due to inability to [employer], key or function physically , or motivate to finish tasks Lack of sleep and inability to eat added to this  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 For appeal to her denial.            Insomnia    Continues ambien prn

## 2022-07-13 ENCOUNTER — Other Ambulatory Visit (HOSPITAL_COMMUNITY): Payer: Self-pay | Admitting: Internal Medicine

## 2022-07-14 ENCOUNTER — Encounter: Payer: Self-pay | Admitting: Family Medicine

## 2022-07-14 ENCOUNTER — Other Ambulatory Visit (HOSPITAL_COMMUNITY): Payer: Self-pay | Admitting: Family Medicine

## 2022-07-14 NOTE — Assessment & Plan Note (Signed)
Grief reaction -overall doing some better and back to work  Still finds challenges in terms of concentration and sleep Much less panic / no more episodes of hyperventilation Reviewed stressors/ coping techniques/symptoms/ support sources/ tx options and side effects in detail today Plan to continue  Sertraline 200 m gdaily  Wellbutrin xl 150 mg daily  Hydroxyzine prn Ambien for sleep  Continues counseling which has been helpful

## 2022-07-14 NOTE — Assessment & Plan Note (Addendum)
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 For appeal to her denial.

## 2022-07-14 NOTE — Assessment & Plan Note (Signed)
Continues ambien prn

## 2022-07-22 NOTE — Telephone Encounter (Signed)
Both letters placed in your inbox for review

## 2022-08-09 ENCOUNTER — Encounter (INDEPENDENT_AMBULATORY_CARE_PROVIDER_SITE_OTHER): Payer: No Typology Code available for payment source | Admitting: Ophthalmology

## 2022-08-09 ENCOUNTER — Ambulatory Visit: Payer: No Typology Code available for payment source | Admitting: Behavioral Health

## 2022-08-09 DIAGNOSIS — Z794 Long term (current) use of insulin: Secondary | ICD-10-CM

## 2022-08-09 DIAGNOSIS — E113212 Type 2 diabetes mellitus with mild nonproliferative diabetic retinopathy with macular edema, left eye: Secondary | ICD-10-CM | POA: Diagnosis not present

## 2022-08-09 DIAGNOSIS — E113291 Type 2 diabetes mellitus with mild nonproliferative diabetic retinopathy without macular edema, right eye: Secondary | ICD-10-CM

## 2022-08-09 DIAGNOSIS — I10 Essential (primary) hypertension: Secondary | ICD-10-CM

## 2022-08-09 DIAGNOSIS — Z7984 Long term (current) use of oral hypoglycemic drugs: Secondary | ICD-10-CM

## 2022-08-09 DIAGNOSIS — H43813 Vitreous degeneration, bilateral: Secondary | ICD-10-CM

## 2022-08-09 DIAGNOSIS — H35033 Hypertensive retinopathy, bilateral: Secondary | ICD-10-CM | POA: Diagnosis not present

## 2022-08-12 ENCOUNTER — Telehealth: Payer: Self-pay

## 2022-08-12 ENCOUNTER — Other Ambulatory Visit (HOSPITAL_COMMUNITY): Payer: Self-pay

## 2022-08-12 ENCOUNTER — Encounter: Payer: Self-pay | Admitting: Internal Medicine

## 2022-08-12 NOTE — Telephone Encounter (Signed)
Patient Advocate Encounter   Received notification from pt msgs that prior authorization is required for HumaLOG KwikPen 200 UNIT/ML KwikPen   Submitted: 08/12/22 Key [id number]  Status is pending

## 2022-08-13 NOTE — Progress Notes (Unsigned)
                Araf Clugston L Shade Kaley, LMFT 

## 2022-08-26 ENCOUNTER — Other Ambulatory Visit: Payer: Self-pay | Admitting: "Endocrinology

## 2022-08-26 MED ORDER — NOVOLOG FLEXPEN 100 UNIT/ML ~~LOC~~ SOPN: PEN_INJECTOR | SUBCUTANEOUS | 0 refills | Status: DC

## 2022-08-27 ENCOUNTER — Other Ambulatory Visit (HOSPITAL_COMMUNITY): Payer: Self-pay

## 2022-08-27 NOTE — Telephone Encounter (Signed)
Called the pharmacy to tell them to fill brand Novolog instead of generic. The pharmacy systems are set to sub to generic, but most ins haven't started paying for the generic insulins yet.

## 2022-08-28 NOTE — Telephone Encounter (Signed)
Left a detailed message,   J.Chevis Weisensel,RMA  

## 2022-09-06 ENCOUNTER — Encounter (INDEPENDENT_AMBULATORY_CARE_PROVIDER_SITE_OTHER): Payer: No Typology Code available for payment source | Admitting: Ophthalmology

## 2022-09-20 ENCOUNTER — Encounter (INDEPENDENT_AMBULATORY_CARE_PROVIDER_SITE_OTHER): Payer: No Typology Code available for payment source | Admitting: Ophthalmology

## 2022-09-20 DIAGNOSIS — I10 Essential (primary) hypertension: Secondary | ICD-10-CM

## 2022-09-20 DIAGNOSIS — Z794 Long term (current) use of insulin: Secondary | ICD-10-CM

## 2022-09-20 DIAGNOSIS — E113212 Type 2 diabetes mellitus with mild nonproliferative diabetic retinopathy with macular edema, left eye: Secondary | ICD-10-CM

## 2022-09-20 DIAGNOSIS — E113291 Type 2 diabetes mellitus with mild nonproliferative diabetic retinopathy without macular edema, right eye: Secondary | ICD-10-CM | POA: Diagnosis not present

## 2022-09-20 DIAGNOSIS — H43813 Vitreous degeneration, bilateral: Secondary | ICD-10-CM

## 2022-09-20 DIAGNOSIS — Z7984 Long term (current) use of oral hypoglycemic drugs: Secondary | ICD-10-CM

## 2022-09-20 DIAGNOSIS — H35033 Hypertensive retinopathy, bilateral: Secondary | ICD-10-CM | POA: Diagnosis not present

## 2022-09-21 ENCOUNTER — Other Ambulatory Visit: Payer: Self-pay | Admitting: Family Medicine

## 2022-09-27 ENCOUNTER — Other Ambulatory Visit: Payer: Self-pay | Admitting: Family Medicine

## 2022-09-27 ENCOUNTER — Encounter: Payer: Self-pay | Admitting: Family Medicine

## 2022-09-27 NOTE — Telephone Encounter (Signed)
Name of Medication: Ambien Name of Pharmacy: [facility]  Last Fill or Written Date and Quantity: 05/28/22 #30 tab/ 3 refills Last Office Visit and Type: 07/12/22 f/u for Forms  Next Office Visit and Type: none scheduled

## 2022-10-07 ENCOUNTER — Other Ambulatory Visit (HOSPITAL_COMMUNITY): Payer: Self-pay | Admitting: Family Medicine

## 2022-10-07 ENCOUNTER — Other Ambulatory Visit: Payer: Self-pay | Admitting: Family Medicine

## 2022-10-11 ENCOUNTER — Ambulatory Visit: Payer: No Typology Code available for payment source | Admitting: Internal Medicine

## 2022-10-11 NOTE — Progress Notes (Deleted)
Patient ID: [name], female   DOB: [birth year], 57 y.o.   MRN: [id number]  HPI: [name] is a 57 y.o.-year-old female, returning for f/u for DM2, dx 2008, insulin-dependent since ~2011, uncontrolled, with complications (peripheral neuropathy, dCHF), h/o papillary thyroid cancer and iatrogenic hypothyroidism. Last OV 3 mo ago.  Interim history: At this visit, she has no increased urination, blurry vision, nausea, chest pain.  She lost her son in 02/2022.  She continues to grieve.  DM2: Reviewed HbA1c levels: Lab Results  Component Value Date   HGBA1C 13.0 (A) 07/01/2022   HGBA1C 9.1 (A) 06/11/2021   HGBA1C 10.8 (A) 12/09/2019    At last OV, she was on: - Toujeo 45 x 2 injections at night >> off since 02/2021 >> restarted 03/2021 >> Off now - Metformin 1000 mg 2x a day  >> off since 02/2021  >> restarted 03/2021 - Invokana 300 mg in a.m. >> Jardiance 25 mg before b'fast - Humalog  >> stopped 2/2 nausea, diarrhea (?) >>  Restarted 03/2021: 15-20 units 3x a day 15 min before meals You may need 5-10 units of Humalog around 8 am even if you do not drink coffee or eat b'fast. She was also on Glipizide but developed hypoglycemia >> loss of consciousness. She was also on Amaryl >> taken off.  She was on Ozempic- started 08/2017 >> restarted >> stopped 2/2 nausea in 2020  I recommended: - Metformin 1000 mg 2x a day - Jardiance 25 mg before b'fast - Toujeo 45 x 2 injections at night - NovoLog 15-20 units 15 min before a meal but 10 units before a snack  She checks sugars >4x a day with her Libre CGM:  Prev.:  Previously:   Pt's meals are: - Breakfast: skips - 10 o'clock: banana, apple, nectarine - Lunch: fast food - Dinner: goes out to eat around 8 o'clock: chicken sandwich or a wrap - Snacks: 0, no concentrated sweets  -No CKD, last BUN/creatinine:  Lab Results  Component Value Date   BUN 17 01/15/2022   CREATININE 0.68 01/15/2022  Not on ACE  inhibitor/ARB  -+ HL; last set of lipids: Lab Results  Component Value Date   CHOL 168 01/15/2022   HDL 38.40 (L) 01/15/2022   LDLCALC 127 (H) 11/24/2020   LDLDIRECT 97.0 01/15/2022   TRIG 341.0 (H) 01/15/2022   CHOLHDL 4 01/15/2022  On Crestor 10.  - last eye exam was in 01/2022: + DR reportedly ([facility], but also sent to Dr. Ashley Royalty - had 2 IO inj), + glaucoma. [facility].Had intraocular lens transplant B in 2022.  - she has Numbness and tingling in her feet.  Latest foot exam in 06/2022.  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 She did not have to have RAI treatment  Neck U/S (11/24/2014): Post thyroidectomy.  No suspicious lymphadenopathy or soft tissue.  Neck U/S (10/07/2017): Post total thyroidectomy without evidence residual, locally recurrent or metastatic disease.  Pt restarted levothyroxine 175 mcg daily, taken: - in a pillbox - in am - fasting - at least 30 min from b'fast - no calcium - + iron at lunchtime and at night - no multivitamins - + PPIs at lunchtime - off Biotin - not on Questran  She is not compliant with doses.  Her TFTs are fluctuating. Lab Results  Component  Value Date   TSH 4.48 01/15/2022   TSH 0.81 06/11/2021   TSH 4.49 11/24/2020   TSH 23.95 (H) 09/29/2019   TSH 20.95 (H) 08/14/2018   TSH 2.011 12/15/2017   TSH 6.32 (H) 08/01/2017   TSH 2.83 05/23/2017   TSH 14.73 (H) 07/26/2016   TSH 9.17 (A) 04/02/2016   Pt denies: - feeling nodules in neck - hoarseness - choking But has chronic dysphagia.  ROS: + see HPI  I reviewed pt's medications, allergies, PMH, social hx, family hx, and changes were documented in the history of present illness. Otherwise, unchanged from my initial visit note.  Past Medical History:  Diagnosis Date    Allergy    SEASONAL   Anemia    iron deficiency   Anxiety    Arthritis    Asthma    a. 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   Cellulitis and abscess of leg 02/2014   CHF (congestive heart failure) (HCC)    Chronic kidney disease    /13   Complication of anesthesia    DIFFICULT WAKING- 2011   Depression    a. Stress reaction 08/2011 in multiple social stressors.   Diabetes mellitus    Dysrhythmia    Gastritis    GERD (gastroesophageal reflux disease)    Heart murmur    Hemorrhoid    Hyperlipidemia    Hypertension    Hyperthyroidism    Insomnia    Morbid obesity (HCC)    OSA (obstructive sleep apnea) 01/17/2012   Papillary thyroid carcinoma (HCC)    PCOS (polycystic ovarian syndrome)    Sleep apnea    C PAP   Thyroid cancer (HCC) 2014   Vertigo    Past Surgical History:  Procedure Laterality Date   CATARACT EXTRACTION Left 04/13/2020   CATARACT EXTRACTION Right 06/01/2020   CHOLECYSTECTOMY     HEMORRHOID SURGERY     INTRAUTERINE DEVICE INSERTION  03/25/2009   SHOULDER SURGERY  06/23/2009   THYROIDECTOMY  04/10/2012   Procedure: THYROIDECTOMY;  Surgeon: Osborn Coho, MD;  Location[facility];  Service: ENT;  Laterality: N/A;  Total Thyroidectomy   TONSILLECTOMY AND ADENOIDECTOMY  CHILD   TUBAL LIGATION     Social History   Socioeconomic History   Marital status: Married    Spouse name: Kaidynce Pfister   Number of children: 2   Years of education: Some college   Highest education level: Not on file  Occupational History   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  Tobacco Use   Smoking status: Former    Current packs/day: 0.00    Average packs/day: 0.2 packs/day for 5.0 years (1.0 ttl pk-yrs)    Types: Cigarettes    Start date: 03/26/1999    Quit date: 03/25/2004    Years since quitting: 18.5   Smokeless tobacco: Never  Vaping Use   Vaping status: Never Used  Substance and Sexual Activity   Alcohol use: No     Alcohol/week: 0.0 standard drinks of alcohol   Drug use: No   Sexual activity: Yes  Other Topics Concern   Not on file  Social History Narrative   Lives with son/husband   Caffeine use: daily   Right handed    Social Determinants of Health   Financial Resource Strain: Not on file  Food Insecurity: Not on file  Transportation Needs: Not on file  Physical Activity: Not on file  Stress: Not on file  Social Connections: Not on file  Intimate Partner Violence: Not on file   Current Outpatient Medications on File Prior to Visit  Medication Sig Dispense Refill   Albuterol Sulfate (PROAIR RESPICLICK) 108 (90 Base) MCG/ACT AEPB Inhale 2 puffs into the lungs every 6 (six) hours as needed. 1 each 2   buPROPion (WELLBUTRIN XL) 150 MG 24 hr tablet TAKE 1 TABLET BY MOUTH EVERY DAY 90 tablet 0   carvedilol (COREG) 6.25 MG tablet TAKE 1 TABLET 2 (TWO) TIMES DAILY WITH A MEAL. NEEDS FOLLOW UP APPOINTMENT FOR ANYMORE REFILLS 90 tablet 0   cetirizine (ZYRTEC) 10 MG tablet TAKE 1 TABLET BY MOUTH EVERY DAY 90 tablet 1   cholecalciferol (VITAMIN D3) 25 MCG (1000 UNIT) tablet Take 2,000 Units by mouth daily.     Continuous Blood Gluc Sensor (FREESTYLE LIBRE 3 SENSOR) MISC 1 each by Does not apply route every 14 (fourteen) days. 6 each 3   cyanocobalamin (VITAMIN B12) 1000 MCG tablet Take 1,000 mcg by mouth daily.     empagliflozin (JARDIANCE) 25 MG TABS tablet Take 1 tablet (25 mg total) by mouth daily before breakfast. 90 tablet 3   ferrous sulfate 325 (65 FE) MG tablet Take 325 mg by mouth 2 (two) times daily.       fluticasone (FLONASE) 50 MCG/ACT nasal spray Place 2 sprays into both nostrils daily as needed for allergies. 16 g 11   glucose blood (ONE TOUCH ULTRA TEST) test strip Use 2x a day 100 each 2   hydrOXYzine (VISTARIL) 25 MG capsule TAKE 1 CAPSULE BY MOUTH EVERY 8 HOURS AS NEEDED FOR ANXIETY OR ITCHING (INSOMNIA). 60 capsule 5   insulin aspart (NOVOLOG FLEXPEN) 100 UNIT/ML FlexPen 15-20 units  three times a day before meal 18 mL 0   insulin glargine, 1 Unit Dial, (TOUJEO SOLOSTAR) 300 UNIT/ML Solostar Pen Inject 90 Units into the skin at bedtime. 18 mL 3   Insulin Pen Needle 32G X 4 MM MISC Use 5x a day 400 each 3   levothyroxine (SYNTHROID) 175 MCG tablet TAKE 1 TABLET BY MOUTH EVERY MORNING ON AN EMPTY STOMACH 90 tablet 3   metFORMIN (GLUCOPHAGE) 1000 MG tablet Take 1 tablet (1,000 mg total) by mouth 2 (two) times daily with a meal. 180 tablet 3   Na Sulfate-K Sulfate-Mg Sulf (SUPREP BOWEL PREP KIT) 17.5-3.13-1.6 GM/177ML SOLN Take 1 kit by mouth as directed. (Patient not taking: Reported on 07/12/2022) 324 mL 0   pantoprazole (PROTONIX) 40 MG tablet TAKE 1 TABLET BY MOUTH EVERY DAY 90 tablet 1   potassium chloride (KLOR-CON) 20 MEQ packet Take 20 mEq by mouth as needed.     rosuvastatin (CRESTOR) 20 MG tablet Take 1 tablet (20 mg total) by mouth daily. 90 tablet 3   sacubitril-valsartan (ENTRESTO) 49-51 MG Take 1 tablet by mouth 2 (two) times daily. NEEDS FOLLOW UP APPOINTMENT FOR MORE REFILLS 60 tablet 3   sertraline (ZOLOFT) 100 MG tablet TAKE 2 TABLETS (200 MG TOTAL) BY MOUTH AT BEDTIME 180 tablet 1   spironolactone (ALDACTONE) 25 MG tablet TAKE 1 TABLET (25 MG TOTAL) BY MOUTH DAILY. 90 tablet 3   torsemide (DEMADEX) 20 MG tablet Take 1 tablet (20 mg total) by mouth daily as needed. 30 tablet 6   TRAVATAN Z 0.004 % SOLN ophthalmic solution Place 1 drop into both eyes at bedtime.      zolpidem (AMBIEN) 10 MG tablet TAKE 1/2 TO 1 TABLET BY MOUTH AT BEDTIME AS NEEDED 30 tablet 3   No current facility-administered medications  on file prior to visit.   Allergies  Allergen Reactions   Tape Rash    Plastic tape   Buspirone Hcl Nausea Only   Glipizide     REACTION: sleepy, low sugar   Ivp Dye [Iodinated Contrast Media]     Shut kidneys down   Oxycodone-Acetaminophen Itching   Family History  Problem Relation Age of Onset   Arthritis Mother        osteoarthritis   Asthma  Mother    Hypertension Mother    Multiple sclerosis Mother    Cancer Father        colon cancer   Arthritis Father        osteoarthritis   Colon cancer Father    Colon polyps Father    Transient ischemic attack Father    COPD Father    Heart disease Maternal Grandfather    Ovarian cancer Maternal Aunt    Diabetes Paternal Aunt    Diabetes Paternal Uncle    Stomach cancer Neg Hx    Esophageal cancer Neg Hx    PE: There were no vitals taken for this visit.  Wt Readings from Last 3 Encounters:  07/12/22 225 lb (102.1 kg)  07/01/22 230 lb 12.8 oz (104.7 kg)  04/19/22 235 lb 4 oz (106.7 kg)   Constitutional: overweight, in NAD Eyes:  EOMI, no exophthalmos ENT: no neck masses, no cervical lymphadenopathy Cardiovascular: RRR, No MRG Respiratory: CTA B Musculoskeletal: no deformities Skin:no rashes Neurological: no tremor with outstretched hands  ASSESSMENT: 1. DM2, insulin-dependent, uncontrolled, without long-term complications but with hyperglycemia  2. Iatrogenic Hypothyroidism - h/o med noncompliance  3. FPTC -Subcentimeter -No RAI treatment was necessary  PLAN:  1. And 4. Patient w/ h/o uncontrolled DM2, on basal-bolus insulin regimen along w/ Metformin and SGLT2 inh. With h/o noncompliance with medications and visits. She initially stopped Ozempic due to GI symptoms in 2021 and afterwards sugars increased to 300-400s, prompting starting Humalog.  At that time, we discussed about improving compliance with medications, blood sugar checks and visits.  I did advise her in the past that if she did not come for follow-up appointments and not taking the medications as prescribed, I would not be able to help her.  Despite this, she returned in 03/2021, after 1 year and 3 months, off her medicines and with very high blood sugars.  I could not check an HbA1c at that time due to the virtual nature of the appointment.  I again advised her at that time that if she did not come for  follow-up and take her medicines as prescribed, unfortunately, I need to discharge her from the practice. She had improvement in her DM control after starting to take meds consistently but at last OV, she again returned after >1 year from the prev. Visit, after she lost her son in 02/2022. She was grieving and sugars were high >> HbA1 was 13%. CGM showed extremely high blood sugars. We restarted her insulins. CGM interpretation: -At today's visit, we reviewed her CGM downloads: It appears that *** of values are in target range (goal >70%), while *** are higher than 180 (goal <25%), and *** are lower than 70 (goal <4%).  The calculated average blood sugar is ***.  The projected HbA1c for the next 3 months (GMI) is ***. -Reviewing the CGM trends, *** - I suggested to:  Patient Instructions  Please continue: - Metformin 1000 mg 2x a day - Jardiance 25 mg before b'fast - Toujeo 45 x  2 injections at night - NovoLog 15-20 units 15 min before a meal but 10 units before a snack   Please also continue: - Levothyroxine 175 mcg daily.   Take the thyroid hormone every day with water, at least 30 minutes before breakfast, separated by at least 4 hours from: - acid reflux medications - calcium - iron - multivitamins   Please return in 3 to 4 months.  - we checked her HbA1c: 7%  - advised to check sugars at different times of the day - 4x a day, rotating check times - advised for yearly eye exams >> she is UTD - return to clinic in 3-4 months  2. Iatrogenic Hypothyroidism -Uncontrolled due to noncompliance with the LT4 dose.  She was prev. off the medication completely for 1.5 months!  We discussed that this is unacceptable and advised her about the risks of hypothyroidism. -I advised her to move the medication on the nightstand and take it as soon as she woke up.  She got a pillbox since then - latest thyroid labs reviewed with pt. >> normal: Lab Results  Component Value Date   TSH 4.48  01/15/2022  - she continues on LT4 175 mcg daily - pt feels good on this dose. - we discussed about taking the thyroid hormone every day, with water, >30 minutes before breakfast, separated by >4 hours from acid reflux medications, calcium, iron, multivitamins. Pt. is taking it correctly. - will check thyroid tests at next OV  3. Follicular PTC -reviewed the neck U/S reports from 11/2014 and 09/2027: no recurrence -she has chronic dysphagia but no neck masses felt on palpation and no new neck compression sxs  Carlus Pavlov, MD PhD [facility] End[facility]

## 2022-10-18 ENCOUNTER — Encounter (INDEPENDENT_AMBULATORY_CARE_PROVIDER_SITE_OTHER): Payer: No Typology Code available for payment source | Admitting: Ophthalmology

## 2022-11-08 ENCOUNTER — Encounter (INDEPENDENT_AMBULATORY_CARE_PROVIDER_SITE_OTHER): Payer: No Typology Code available for payment source | Admitting: Ophthalmology

## 2022-11-08 DIAGNOSIS — H43813 Vitreous degeneration, bilateral: Secondary | ICD-10-CM

## 2022-11-08 DIAGNOSIS — E113291 Type 2 diabetes mellitus with mild nonproliferative diabetic retinopathy without macular edema, right eye: Secondary | ICD-10-CM

## 2022-11-08 DIAGNOSIS — H35033 Hypertensive retinopathy, bilateral: Secondary | ICD-10-CM

## 2022-11-08 DIAGNOSIS — Z794 Long term (current) use of insulin: Secondary | ICD-10-CM | POA: Diagnosis not present

## 2022-11-08 DIAGNOSIS — Z7984 Long term (current) use of oral hypoglycemic drugs: Secondary | ICD-10-CM

## 2022-11-08 DIAGNOSIS — I10 Essential (primary) hypertension: Secondary | ICD-10-CM

## 2022-11-08 DIAGNOSIS — E113312 Type 2 diabetes mellitus with moderate nonproliferative diabetic retinopathy with macular edema, left eye: Secondary | ICD-10-CM | POA: Diagnosis not present

## 2022-11-21 LAB — HM MAMMOGRAPHY

## 2022-11-22 LAB — HM PAP SMEAR: HPV, high-risk: NEGATIVE

## 2022-11-24 ENCOUNTER — Other Ambulatory Visit (HOSPITAL_COMMUNITY): Payer: Self-pay | Admitting: Family Medicine

## 2022-12-06 ENCOUNTER — Encounter (INDEPENDENT_AMBULATORY_CARE_PROVIDER_SITE_OTHER): Payer: No Typology Code available for payment source | Admitting: Ophthalmology

## 2022-12-06 DIAGNOSIS — Z794 Long term (current) use of insulin: Secondary | ICD-10-CM | POA: Diagnosis not present

## 2022-12-06 DIAGNOSIS — H35033 Hypertensive retinopathy, bilateral: Secondary | ICD-10-CM

## 2022-12-06 DIAGNOSIS — I10 Essential (primary) hypertension: Secondary | ICD-10-CM

## 2022-12-06 DIAGNOSIS — Z7984 Long term (current) use of oral hypoglycemic drugs: Secondary | ICD-10-CM

## 2022-12-06 DIAGNOSIS — H43813 Vitreous degeneration, bilateral: Secondary | ICD-10-CM

## 2022-12-06 DIAGNOSIS — E113212 Type 2 diabetes mellitus with mild nonproliferative diabetic retinopathy with macular edema, left eye: Secondary | ICD-10-CM

## 2022-12-06 DIAGNOSIS — E113291 Type 2 diabetes mellitus with mild nonproliferative diabetic retinopathy without macular edema, right eye: Secondary | ICD-10-CM | POA: Diagnosis not present

## 2022-12-12 ENCOUNTER — Other Ambulatory Visit (HOSPITAL_COMMUNITY): Payer: Self-pay | Admitting: Internal Medicine

## 2022-12-30 ENCOUNTER — Other Ambulatory Visit: Payer: Self-pay | Admitting: Internal Medicine

## 2022-12-30 ENCOUNTER — Other Ambulatory Visit: Payer: Self-pay | Admitting: Family Medicine

## 2022-12-31 NOTE — Telephone Encounter (Signed)
Pt is due for her CPE (labs prior) on or after 01/24/23, please schedule.   Med refilled once

## 2023-01-01 NOTE — Telephone Encounter (Signed)
Pt needs an appt

## 2023-01-01 NOTE — Telephone Encounter (Signed)
Scheduled patient for cpe and labs

## 2023-01-03 ENCOUNTER — Encounter (INDEPENDENT_AMBULATORY_CARE_PROVIDER_SITE_OTHER): Payer: No Typology Code available for payment source | Admitting: Ophthalmology

## 2023-01-06 ENCOUNTER — Other Ambulatory Visit: Payer: Self-pay | Admitting: "Endocrinology

## 2023-01-07 ENCOUNTER — Encounter (INDEPENDENT_AMBULATORY_CARE_PROVIDER_SITE_OTHER): Payer: No Typology Code available for payment source | Admitting: Ophthalmology

## 2023-01-07 DIAGNOSIS — I10 Essential (primary) hypertension: Secondary | ICD-10-CM

## 2023-01-07 DIAGNOSIS — Z794 Long term (current) use of insulin: Secondary | ICD-10-CM | POA: Diagnosis not present

## 2023-01-07 DIAGNOSIS — E113291 Type 2 diabetes mellitus with mild nonproliferative diabetic retinopathy without macular edema, right eye: Secondary | ICD-10-CM | POA: Diagnosis not present

## 2023-01-07 DIAGNOSIS — E113312 Type 2 diabetes mellitus with moderate nonproliferative diabetic retinopathy with macular edema, left eye: Secondary | ICD-10-CM | POA: Diagnosis not present

## 2023-01-07 DIAGNOSIS — H43813 Vitreous degeneration, bilateral: Secondary | ICD-10-CM

## 2023-01-07 DIAGNOSIS — H35033 Hypertensive retinopathy, bilateral: Secondary | ICD-10-CM

## 2023-01-07 DIAGNOSIS — Z7984 Long term (current) use of oral hypoglycemic drugs: Secondary | ICD-10-CM

## 2023-01-12 ENCOUNTER — Other Ambulatory Visit (HOSPITAL_COMMUNITY): Payer: Self-pay | Admitting: Internal Medicine

## 2023-01-19 ENCOUNTER — Telehealth: Payer: Self-pay | Admitting: Family Medicine

## 2023-01-19 DIAGNOSIS — E538 Deficiency of other specified B group vitamins: Secondary | ICD-10-CM

## 2023-01-19 DIAGNOSIS — D509 Iron deficiency anemia, unspecified: Secondary | ICD-10-CM

## 2023-01-19 DIAGNOSIS — E890 Postprocedural hypothyroidism: Secondary | ICD-10-CM

## 2023-01-19 DIAGNOSIS — E1169 Type 2 diabetes mellitus with other specified complication: Secondary | ICD-10-CM

## 2023-01-19 DIAGNOSIS — I10 Essential (primary) hypertension: Secondary | ICD-10-CM

## 2023-01-19 DIAGNOSIS — E559 Vitamin D deficiency, unspecified: Secondary | ICD-10-CM

## 2023-01-19 NOTE — Telephone Encounter (Signed)
-----   Message from Alvina Chou sent at 01/08/2023  9:18 AM EDT ----- Regarding: Lab orders for [facility], 10.31.24 Patient is scheduled for CPX labs, please order future labs, Thanks , Camelia Eng

## 2023-01-23 ENCOUNTER — Other Ambulatory Visit: Payer: No Typology Code available for payment source

## 2023-01-23 DIAGNOSIS — E890 Postprocedural hypothyroidism: Secondary | ICD-10-CM | POA: Diagnosis not present

## 2023-01-23 DIAGNOSIS — E785 Hyperlipidemia, unspecified: Secondary | ICD-10-CM

## 2023-01-23 DIAGNOSIS — E1169 Type 2 diabetes mellitus with other specified complication: Secondary | ICD-10-CM

## 2023-01-23 DIAGNOSIS — D509 Iron deficiency anemia, unspecified: Secondary | ICD-10-CM

## 2023-01-23 DIAGNOSIS — E559 Vitamin D deficiency, unspecified: Secondary | ICD-10-CM

## 2023-01-23 DIAGNOSIS — I10 Essential (primary) hypertension: Secondary | ICD-10-CM

## 2023-01-23 DIAGNOSIS — E538 Deficiency of other specified B group vitamins: Secondary | ICD-10-CM | POA: Diagnosis not present

## 2023-01-23 LAB — CBC WITH DIFFERENTIAL/PLATELET
Basophils Absolute: 0.1 10*3/uL (ref 0.0–0.1)
Basophils Relative: 1.2 % (ref 0.0–3.0)
Eosinophils Absolute: 0.2 10*3/uL (ref 0.0–0.7)
Eosinophils Relative: 2.7 % (ref 0.0–5.0)
HCT: 44.1 % (ref 36.0–46.0)
Hemoglobin: 13.9 g/dL (ref 12.0–15.0)
Lymphocytes Relative: 19.2 % (ref 12.0–46.0)
Lymphs Abs: 1.5 10*3/uL (ref 0.7–4.0)
MCHC: 31.6 g/dL (ref 30.0–36.0)
MCV: 90.5 fL (ref 78.0–100.0)
Monocytes Absolute: 0.4 10*3/uL (ref 0.1–1.0)
Monocytes Relative: 5.6 % (ref 3.0–12.0)
Neutro Abs: 5.4 10*3/uL (ref 1.4–7.7)
Neutrophils Relative %: 71.3 % (ref 43.0–77.0)
Platelets: 217 10*3/uL (ref 150.0–400.0)
RBC: 4.87 Mil/uL (ref 3.87–5.11)
RDW: 14.2 % (ref 11.5–15.5)
WBC: 7.6 10*3/uL (ref 4.0–10.5)

## 2023-01-23 LAB — LIPID PANEL
Cholesterol: 139 mg/dL (ref 0–200)
HDL: 42.6 mg/dL (ref >39.00)
LDL Cholesterol: 52 mg/dL (ref 0–99)
NonHDL: 96.87
Total CHOL/HDL Ratio: 3
Triglycerides: 225 mg/dL — ABNORMAL HIGH (ref 0.0–149.0)
VLDL: 45 mg/dL — ABNORMAL HIGH (ref 0.0–40.0)

## 2023-01-23 LAB — COMPREHENSIVE METABOLIC PANEL
ALT: 20 U/L (ref 0–35)
AST: 20 U/L (ref 0–37)
Albumin: 4.3 g/dL (ref 3.5–5.2)
Alkaline Phosphatase: 83 U/L (ref 39–117)
BUN: 15 mg/dL (ref 6–23)
CO2: 32 meq/L (ref 19–32)
Calcium: 9.3 mg/dL (ref 8.4–10.5)
Chloride: 98 meq/L (ref 96–112)
Creatinine, Ser: 0.77 mg/dL (ref 0.40–1.20)
GFR: 85.74 mL/min (ref >60.00)
Glucose, Bld: 248 mg/dL — ABNORMAL HIGH (ref 70–99)
Potassium: 3.7 meq/L (ref 3.5–5.1)
Sodium: 141 meq/L (ref 135–145)
Total Bilirubin: 0.5 mg/dL (ref 0.2–1.2)
Total Protein: 6.8 g/dL (ref 6.0–8.3)

## 2023-01-23 LAB — VITAMIN D 25 HYDROXY (VIT D DEFICIENCY, FRACTURES): VITD: 28.13 ng/mL — ABNORMAL LOW (ref 30.00–100.00)

## 2023-01-23 LAB — TSH: TSH: 1.6 u[IU]/mL (ref 0.35–5.50)

## 2023-01-23 LAB — IRON: Iron: 56 ug/dL (ref 42–145)

## 2023-01-23 LAB — VITAMIN B12: Vitamin B-12: 771 pg/mL (ref 211–911)

## 2023-01-28 ENCOUNTER — Other Ambulatory Visit: Payer: Self-pay | Admitting: Family Medicine

## 2023-01-28 NOTE — Telephone Encounter (Signed)
Name of Medication: Ambien Name of Pharmacy: [facility]  Last Fill or Written Date and Quantity: 09/27/22 #30 tab/ 3 refills Last Office Visit and Type: 07/12/22 f/u for Forms  Next Office Visit and Type: 01/31/23 CPE

## 2023-01-30 ENCOUNTER — Encounter: Payer: Self-pay | Admitting: Family Medicine

## 2023-01-30 NOTE — Telephone Encounter (Signed)
I will hold med list for appt but problem list placed in PCP's inbox for review

## 2023-01-30 NOTE — Telephone Encounter (Signed)
Per Dr. Milinda Antis:  Called pt and changed her appt from CPE to a problem based acute appt. (Pt was okay with that) pt advise that she does have multiple issues and we may need an additional visit to address everything on the list but PCP will discusses this tomorrow. Pt verbalized understanding

## 2023-01-31 ENCOUNTER — Encounter: Payer: Self-pay | Admitting: Family Medicine

## 2023-01-31 ENCOUNTER — Ambulatory Visit: Payer: No Typology Code available for payment source | Admitting: Family Medicine

## 2023-01-31 VITALS — BP 142/80 | HR 75 | Temp 98.7°F | Ht 65.0 in | Wt 255.0 lb

## 2023-01-31 DIAGNOSIS — I5033 Acute on chronic diastolic (congestive) heart failure: Secondary | ICD-10-CM

## 2023-01-31 DIAGNOSIS — R2689 Other abnormalities of gait and mobility: Secondary | ICD-10-CM | POA: Diagnosis not present

## 2023-01-31 DIAGNOSIS — F418 Other specified anxiety disorders: Secondary | ICD-10-CM

## 2023-01-31 DIAGNOSIS — R202 Paresthesia of skin: Secondary | ICD-10-CM

## 2023-01-31 DIAGNOSIS — E1140 Type 2 diabetes mellitus with diabetic neuropathy, unspecified: Secondary | ICD-10-CM

## 2023-01-31 DIAGNOSIS — J3489 Other specified disorders of nose and nasal sinuses: Secondary | ICD-10-CM

## 2023-01-31 DIAGNOSIS — F4320 Adjustment disorder, unspecified: Secondary | ICD-10-CM

## 2023-01-31 DIAGNOSIS — E249 Cushing's syndrome, unspecified: Secondary | ICD-10-CM

## 2023-01-31 DIAGNOSIS — J300 Vasomotor rhinitis: Secondary | ICD-10-CM

## 2023-01-31 DIAGNOSIS — M6281 Muscle weakness (generalized): Secondary | ICD-10-CM

## 2023-01-31 DIAGNOSIS — E538 Deficiency of other specified B group vitamins: Secondary | ICD-10-CM

## 2023-01-31 DIAGNOSIS — E890 Postprocedural hypothyroidism: Secondary | ICD-10-CM

## 2023-01-31 DIAGNOSIS — G8929 Other chronic pain: Secondary | ICD-10-CM

## 2023-01-31 DIAGNOSIS — I10 Essential (primary) hypertension: Secondary | ICD-10-CM

## 2023-01-31 DIAGNOSIS — R519 Headache, unspecified: Secondary | ICD-10-CM | POA: Insufficient documentation

## 2023-01-31 DIAGNOSIS — E559 Vitamin D deficiency, unspecified: Secondary | ICD-10-CM

## 2023-01-31 DIAGNOSIS — F4321 Adjustment disorder with depressed mood: Secondary | ICD-10-CM

## 2023-01-31 MED ORDER — VENLAFAXINE HCL ER 75 MG PO CP24: 75.0000 mg | ORAL_CAPSULE | Freq: Every day | ORAL | 1 refills | Status: DC

## 2023-01-31 NOTE — Patient Instructions (Addendum)
I want you to go up on vitamin D3 to 4000 international units daily   Try some aquaphor in nostrils at bedtime to heal sores   I want to order MRI of head if we can ([place])  You will get a call to set that up   I would like to try changing your sertraline to effexor xr for anxiety and depression  Stop the sertraline Start effexor xr 75 mg daily  If you get any intolerable side effects or if you feel worse let me know   Follow up in after your trip   Take care of yourself best you can  Schedule your follow up with cardiology Schedule your follow up with endocrinology   Think about starting to get back on track with a diabetic diet Try to get most of your carbohydrates from produce (with the exception of white potatoes) and whole grains Eat less bread/pasta/rice/snack foods/cereals/sweets and other items from the middle of the grocery store (processed carbs)

## 2023-01-31 NOTE — Progress Notes (Unsigned)
Subjective:    Patient ID: [name], female    DOB: [birth year], 57 y.o.   MRN: [id number]  HPI  Wt Readings from Last 3 Encounters:  01/31/23 255 lb (115.7 kg)  07/12/22 225 lb (102.1 kg)  07/01/22 230 lb 12.8 oz (104.7 kg)   42.43 kg/m  Vitals:   01/31/23 1451 01/31/23 1535  BP: (!) 154/84 (!) 142/80  Pulse: 75   Temp: 98.7 F (37.1 C)   SpO2: 98%     Pt presents for c/o many symptoms from different parts of the body including  Bone and joint and muscle pain /spasms  Generalized weakness  Of note vit D is low    Fatigue (has OSA)-using cpap  Depression Irritability and anxiety  Tearfulness -still on and off  Slowed cognition  ? Slurred speech or word searching  Continued grief after loss of son -overall has good and bad days but doing better than in the past  Always worse this time of the month   She did try virtual counseling  It did not work out technologically  It would be too hard to get to in person counseling   Work is more stressful also   A little exercise-walking dog  Not eating much at all     Nausea with occational vomiting  Occational trouble swallowing Known fatty liver in past   Poor balance Prickly/tingling feelings in feet  Electric shock feeling in head Facial numbness Occational drooling  Headaches (night and early am)    of note normal MRI brain in 2020, some deg discussed dz in CS MS in the family /mother   Neurology  Loss of taste sens from covid  Sores on tongue Raw feeling nose and lesions in it occational like zits   Vasomotor rhinitis - when eating  Worse since covid  Has used zyrtec and flonase        01/22/2022   12:34 PM 11/24/2020    4:24 PM 10/01/2019    8:38 AM 08/08/2017   12:17 PM  Depression screen PHQ 2/9  Decreased Interest 2 3 1  0  Down, Depressed, Hopeless 2 1 2  0  PHQ - 2 Score 4 4 3  0  Altered sleeping 2 3 2 3   Tired, decreased energy 3 3 2 3   Change in appetite 2 2 1 3   Feeling bad  or failure about yourself  1 2 0 0  Trouble concentrating 3 3 0 3  Moving slowly or fidgety/restless 2 1 0 1  Suicidal thoughts 1 1 0 1  PHQ-9 Score 18 19 8 14   Difficult doing work/chores   Somewhat difficult        No data to display          Dep/anx  Sertraline 200 mg daily  Wellbutrin xl 150 mg daily  Hydroxyzine prn -does not do much  Ambien for sleep   In past buspar gave her nausea    HTN with chf  bp is stable today  No cp or palpitations or headaches or edema  No side effects to medicines  BP Readings from Last 3 Encounters:  01/31/23 (!) 142/80  07/12/22 120/62  07/01/22 130/88    Spironolactone 25 mg daily  Coreg 6.25 mg bid (? Possible 3.25) Entresto bid 49-51 mg  Demadex prn   Pulse Readings from Last 3 Encounters:  01/31/23 75  07/12/22 65  07/01/22 65     Endo care for DM   Hypothyroidism (past thyroid  papillary carcinoma) Pt has no clinical changes No change in energy level/ hair or skin/ edema and no tremor Lab Results  Component Value Date   TSH 1.60 01/23/2023    Levothyroxine 175 mcg Endo care   Also [place]   Lab Results  Component Value Date   NA 141 01/23/2023   K 3.7 01/23/2023   CO2 32 01/23/2023   GLUCOSE 248 (H) 01/23/2023   BUN 15 01/23/2023   CREATININE 0.77 01/23/2023   CALCIUM 9.3 01/23/2023   GFR 85.74 01/23/2023   GFRNONAA >60 07/20/2021   Lab Results  Component Value Date   ALT 20 01/23/2023   AST 20 01/23/2023   ALKPHOS 83 01/23/2023   BILITOT 0.5 01/23/2023     Last vitamin D Lab Results  Component Value Date   VD25OH 28.13 (L) 01/23/2023   Lab Results  Component Value Date   VITAMINB12 771 01/23/2023   Lab Results  Component Value Date   IRON 56 01/23/2023   TIBC 450 01/26/2012   FERRITIN 16.2 07/26/2016   Lab Results  Component Value Date   WBC 7.6 01/23/2023   HGB 13.9 01/23/2023   HCT 44.1 01/23/2023   MCV 90.5 01/23/2023   PLT 217.0 01/23/2023      Patient Active Problem  List   Diagnosis Date Noted   Poor balance 01/31/2023   Muscle weakness 01/31/2023   Chronic headaches 01/31/2023   Papillary thyroid carcinoma (HCC) 07/01/2022   Grief reaction 03/05/2022   Vitamin B12 deficiency 01/22/2022   Current use of proton pump inhibitor 11/24/2020   Colon cancer screening 11/24/2020   Vitamin D deficiency 09/27/2019   Lumbar radiculopathy 12/16/2018   Poorly controlled type 2 diabetes mellitus with peripheral neuropathy (HCC) 12/16/2018   Bilateral carpal tunnel syndrome 11/24/2018   Family history of MS (multiple sclerosis) 09/29/2018   Routine general medical examination at a health care facility 12/19/2013   Palpitations 02/03/2013   Postsurgical hypothyroidism 04/28/2012   History of thyroid cancer 04/27/2012   Cushing's syndrome (HCC) 04/27/2012   Chronic diastolic heart failure (HCC) 02/10/2012   Diastolic CHF (HCC) 01/25/2012   OSA (obstructive sleep apnea) just got cpap at home this past week 01/17/12 01/17/2012   Chronic allergic rhinitis 10/26/2010   Insomnia 10/26/2010   OBESITY, MORBID 04/27/2010   HEMORRHOIDS, INTERNAL, WITH BLEEDING 08/17/2009   GERD 04/27/2008   Essential hypertension 11/03/2007   Hyperlipidemia associated with type 2 diabetes mellitus (HCC) 05/14/2007   OTHER ORGANIC SLEEP DISORDERS 03/09/2007   Uncontrolled type 2 diabetes mellitus with peripheral neuropathy 09/11/2006   POLYCYSTIC OVARIES 09/11/2006   Iron deficiency anemia 09/11/2006   Depression with anxiety 09/11/2006   EXTERNAL HEMORRHOIDS 09/11/2006   Asthma 09/11/2006   Past Medical History:  Diagnosis Date   Allergy    SEASONAL   Anemia    iron deficiency   Anxiety    Arthritis    Asthma    a. 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   Cataract 02/23/2019   Dr. Charlotte Sanes @ [facility] Both eyes, small.   Cellulitis and abscess of leg 02/2014   CHF (congestive heart failure) (HCC)    Chronic kidney disease    /13    Complication of anesthesia    DIFFICULT WAKING- 2011   Depression    a. Stress reaction 08/2011 in multiple social stressors.   Diabetes mellitus    Dysrhythmia    Gastritis    GERD (gastroesophageal reflux disease)  Heart murmur    Hemorrhoid    Hyperlipidemia    Hypertension    Hyperthyroidism    Insomnia    Morbid obesity (HCC)    Neuromuscular disorder (HCC) 12/16/2018   Dr. Mitzie Na Neuro, mild L5 /S1 radiculopathy, mild DJD   OSA (obstructive sleep apnea) 01/17/2012   Papillary thyroid carcinoma (HCC)    PCOS (polycystic ovarian syndrome)    Sleep apnea    C PAP   Thyroid cancer (HCC) 2014   Vertigo    Past Surgical History:  Procedure Laterality Date   CATARACT EXTRACTION Left 04/13/2020   CATARACT EXTRACTION Right 06/01/2020   CHOLECYSTECTOMY     EYE SURGERY     HEMORRHOID SURGERY     INTRAUTERINE DEVICE INSERTION  03/25/2009   SHOULDER SURGERY  06/23/2009   THYROIDECTOMY  04/10/2012   Procedure: THYROIDECTOMY;  Surgeon: Osborn Coho, MD;  Location: [facility] OR;  Service: ENT;  Laterality: N/A;  Total Thyroidectomy   TONSILLECTOMY AND ADENOIDECTOMY  CHILD   TUBAL LIGATION     Social History   Tobacco Use   Smoking status: Former    Current packs/day: 0.00    Average packs/day: 0.2 packs/day for 5.0 years (1.0 ttl pk-yrs)    Types: Cigarettes    Start date: 03/26/1999    Quit date: 03/25/2004    Years since quitting: 18.8   Smokeless tobacco: Never  Vaping Use   Vaping status: Never Used  Substance Use Topics   Alcohol use: No   Drug use: No   Family History  Problem Relation Age of Onset   Arthritis Mother        osteoarthritis   Asthma Mother    Hypertension Mother    Multiple sclerosis Mother    Anxiety disorder Mother    Depression Mother    Vision loss Mother    Cancer Father        colon cancer   Arthritis Father        osteoarthritis   Colon cancer Father    Colon polyps Father    Transient ischemic attack Father    COPD Father     Asthma Father    Stroke Father    Vision loss Father    Heart disease Maternal Grandfather    Ovarian cancer Maternal Aunt    Diabetes Paternal Aunt    Diabetes Paternal Uncle    Alcohol abuse Son    Drug abuse Son    Early death Son    Stomach cancer Neg Hx    Esophageal cancer Neg Hx    Allergies  Allergen Reactions   Tape Rash    Plastic tape   Buspirone Hcl Nausea Only   Glipizide     REACTION: sleepy, low sugar   Ivp Dye [Iodinated Contrast Media]     Shut kidneys down   Oxycodone-Acetaminophen Itching   Current Outpatient Medications on File Prior to Visit  Medication Sig Dispense Refill   Albuterol Sulfate (PROAIR RESPICLICK) 108 (90 Base) MCG/ACT AEPB Inhale 2 puffs into the lungs every 6 (six) hours as needed. 1 each 2   buPROPion (WELLBUTRIN XL) 150 MG 24 hr tablet TAKE 1 TABLET BY MOUTH EVERY DAY 90 tablet 0   carvedilol (COREG) 6.25 MG tablet TAKE 1 TABLET 2 (TWO) TIMES DAILY WITH A MEAL. NEEDS FOLLOW UP APPOINTMENT FOR ANYMORE REFILLS 180 tablet 1   cetirizine (ZYRTEC) 10 MG tablet TAKE 1 TABLET BY MOUTH EVERY DAY 90 tablet 1   cholecalciferol (VITAMIN  D3) 25 MCG (1000 UNIT) tablet Take 2,000 Units by mouth daily.     Continuous Blood Gluc Sensor (FREESTYLE LIBRE 3 SENSOR) MISC 1 each by Does not apply route every 14 (fourteen) days. 6 each 3   cyanocobalamin (VITAMIN B12) 500 MCG tablet Take 500 mcg by mouth daily.     dorzolamide (TRUSOPT) 2 % ophthalmic solution Place 1 drop into both eyes 2 (two) times daily.     empagliflozin (JARDIANCE) 25 MG TABS tablet Take 1 tablet (25 mg total) by mouth daily before breakfast. 90 tablet 3   ferrous sulfate 325 (65 FE) MG tablet Take 325 mg by mouth 2 (two) times daily.       fluticasone (FLONASE) 50 MCG/ACT nasal spray Place 2 sprays into both nostrils daily as needed for allergies. 16 g 11   hydrOXYzine (VISTARIL) 25 MG capsule TAKE 1 CAPSULE BY MOUTH EVERY 8 HOURS AS NEEDED FOR ANXIETY OR ITCHING (INSOMNIA). 60 capsule 5    insulin aspart (NOVOLOG FLEXPEN) 100 UNIT/ML FlexPen INJECT 15-20 UNITS THREE TIMES A DAY BEFORE MEALS. 45 mL 0   insulin glargine, 1 Unit Dial, (TOUJEO SOLOSTAR) 300 UNIT/ML Solostar Pen Inject 90 Units into the skin daily. 18 mL 1   Insulin Pen Needle 32G X 4 MM MISC Use 5x a day 400 each 3   levothyroxine (SYNTHROID) 175 MCG tablet TAKE 1 TABLET BY MOUTH EVERY MORNING ON AN EMPTY STOMACH 90 tablet 3   metFORMIN (GLUCOPHAGE) 1000 MG tablet Take 1 tablet (1,000 mg total) by mouth 2 (two) times daily with a meal. 180 tablet 3   pantoprazole (PROTONIX) 40 MG tablet TAKE 1 TABLET BY MOUTH EVERY DAY 90 tablet 1   potassium chloride (KLOR-CON) 20 MEQ packet Take 20 mEq by mouth as needed.     rosuvastatin (CRESTOR) 20 MG tablet TAKE 1 TABLET BY MOUTH EVERY DAY 90 tablet 0   sacubitril-valsartan (ENTRESTO) 49-51 MG TAKE 1 TABLET BY MOUTH 2 (TWO) TIMES DAILY. NEEDS FOLLOW UP APPOINTMENT FOR MORE REFILLS 60 tablet 0   spironolactone (ALDACTONE) 25 MG tablet Take 1 tablet (25 mg total) by mouth daily. NEEDS FOLLOW UP APPOINTMENT FOR MORE REFILLS 90 tablet 1   torsemide (DEMADEX) 20 MG tablet Take 1 tablet (20 mg total) by mouth daily as needed. 30 tablet 6   zolpidem (AMBIEN) 10 MG tablet TAKE 1/2 TO 1 TABLET BY MOUTH AT BEDTIME AS NEEDED 30 tablet 3   No current facility-administered medications on file prior to visit.    Review of Systems     Objective:   Physical Exam        Assessment & Plan:   Problem List Items Addressed This Visit       Cardiovascular and Mediastinum   Essential hypertension - Primary     Other   Chronic headaches   Relevant Medications   venlafaxine XR (EFFEXOR XR) 75 MG 24 hr capsule   Muscle weakness   OBESITY, MORBID   Poor balance   Vitamin D deficiency

## 2023-02-02 DIAGNOSIS — R202 Paresthesia of skin: Secondary | ICD-10-CM | POA: Insufficient documentation

## 2023-02-02 DIAGNOSIS — J3489 Other specified disorders of nose and nasal sinuses: Secondary | ICD-10-CM | POA: Insufficient documentation

## 2023-02-02 NOTE — Assessment & Plan Note (Signed)
Due for follow up with endocrinology  Unsure if any current symptoms are exacerbated by this

## 2023-02-02 NOTE — Assessment & Plan Note (Signed)
Sees endocrinology  Lab Results  Component Value Date   TSH 1.60 01/23/2023   Continues levothyroxine 175 mcg daily

## 2023-02-02 NOTE — Assessment & Plan Note (Signed)
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

## 2023-02-02 NOTE — Assessment & Plan Note (Addendum)
Strongly encouraged follow up with endocrinology  Diet is not optimal  Glucose 248 with our last labs   Reviewed goals for low glycemic diet and exercise   I do wonder if nausea may be from diabetic gastroparesis

## 2023-02-02 NOTE — Assessment & Plan Note (Signed)
Discussed how this problem influences overall health and the risks it imposes  Reviewed plan for weight loss with lower calorie diet (via better food choices (lower glycemic and portion control) along with exercise building up to or more than 30 minutes 5 days per week including some aerobic activity and strength training    Anxiety is high making this difficult  ? If change from ssri to snri would make it easier to loose weight

## 2023-02-02 NOTE — Assessment & Plan Note (Signed)
Prone recently to sores in nostrils  No signs of infection today  Recommend trial of aquaphor (small amount) in nostrils daily as eneded

## 2023-02-02 NOTE — Assessment & Plan Note (Addendum)
With headaches , paresthesias of hands and feet, and intermittent muscle weakness MRI ordered (brain)  MS runs in family

## 2023-02-02 NOTE — Assessment & Plan Note (Signed)
Planning follow up with cardiology

## 2023-02-02 NOTE — Assessment & Plan Note (Signed)
bp is not quite at goal, but anxiety is high  BP Readings from Last 1 Encounters:  01/31/23 (!) 142/80   No changes needed Most recent labs reviewed  Disc lifstyle change with low sodium diet and exercise   Current medicines Spironolactone 25 mg daily  Coreg 6.25 mg bid (? Possible 3.25) Entresto bid 49-51 mg  Demadex prn   Planning cardiology visit  Labs reviewed

## 2023-02-02 NOTE — Assessment & Plan Note (Addendum)
In setting of grief  Anxiety is the bigger problem  Tried to do virtual counseling- tech did not work out, she declines further referral now because in person would be impossible with her schedule  Discussed options  Buspar gave her nausea in past  Will try to change sertraline 200 mg to effexor xr 75 mg (with option to titrate up if helpful)  Discussed expectations of SNRI  medication including time to effectiveness and mechanism of action, also poss of side effects (early and late)- including mental fuzziness, weight or appetite change, nausea and poss of worse dep or anxiety (even suicidal thoughts)  Pt voiced understanding and will stop med and update if this occurs   Also continues Wellbturin xl 150 mg daily  Hydroxyzine prn  Ambien prn with caution   Will follow up in December after her trip

## 2023-02-02 NOTE — Assessment & Plan Note (Signed)
Continues zyrtec and flonase

## 2023-02-02 NOTE — Assessment & Plan Note (Signed)
Worsened lately  Also balance issues/ weakness Fam history of MS  MRI brain ordered   Today noted some backward drift on rhomberg Gait is guarded

## 2023-02-02 NOTE — Assessment & Plan Note (Signed)
Last vitamin D Lab Results  Component Value Date   VD25OH 28.13 (L) 01/23/2023   Encouraged pt to increase over the counter D3 to 4000 international units daily for bone and overall health

## 2023-02-02 NOTE — Assessment & Plan Note (Signed)
Hands and feet  Unsure if due to dm or not Worsening   Fam history of MS  MRI brain ordered

## 2023-02-02 NOTE — Assessment & Plan Note (Signed)
Lab Results  Component Value Date   VITAMINB12 771 01/23/2023   Urged to continue oral supplementation

## 2023-02-02 NOTE — Assessment & Plan Note (Signed)
Intermittent / different areas Often legs   Fam history of MS  Order done for MRI brain

## 2023-02-05 ENCOUNTER — Encounter: Payer: Self-pay | Admitting: *Deleted

## 2023-02-06 ENCOUNTER — Encounter (INDEPENDENT_AMBULATORY_CARE_PROVIDER_SITE_OTHER): Payer: No Typology Code available for payment source | Admitting: Ophthalmology

## 2023-02-06 DIAGNOSIS — E113492 Type 2 diabetes mellitus with severe nonproliferative diabetic retinopathy without macular edema, left eye: Secondary | ICD-10-CM | POA: Diagnosis not present

## 2023-02-06 DIAGNOSIS — E113291 Type 2 diabetes mellitus with mild nonproliferative diabetic retinopathy without macular edema, right eye: Secondary | ICD-10-CM | POA: Diagnosis not present

## 2023-02-06 DIAGNOSIS — Z794 Long term (current) use of insulin: Secondary | ICD-10-CM

## 2023-02-06 DIAGNOSIS — I10 Essential (primary) hypertension: Secondary | ICD-10-CM

## 2023-02-06 DIAGNOSIS — Z7984 Long term (current) use of oral hypoglycemic drugs: Secondary | ICD-10-CM | POA: Diagnosis not present

## 2023-02-06 DIAGNOSIS — H35033 Hypertensive retinopathy, bilateral: Secondary | ICD-10-CM

## 2023-02-06 DIAGNOSIS — H43813 Vitreous degeneration, bilateral: Secondary | ICD-10-CM

## 2023-02-19 ENCOUNTER — Other Ambulatory Visit (HOSPITAL_COMMUNITY): Payer: Self-pay | Admitting: Internal Medicine

## 2023-02-19 NOTE — Telephone Encounter (Signed)
Pt called and stated that t [facility] to schedule your MRI Brain appointment but gave her a high estimate pt would like to know if provider could find cheaper  place to go . Apt is know cancel pt would like a call back

## 2023-02-21 ENCOUNTER — Encounter (HOSPITAL_COMMUNITY): Payer: Self-pay

## 2023-02-21 ENCOUNTER — Ambulatory Visit (HOSPITAL_COMMUNITY): Payer: No Typology Code available for payment source

## 2023-02-24 ENCOUNTER — Other Ambulatory Visit: Payer: Self-pay | Admitting: Family Medicine

## 2023-02-25 ENCOUNTER — Other Ambulatory Visit: Payer: Self-pay | Admitting: Family Medicine

## 2023-02-25 MED ORDER — BUPROPION HCL ER (XL) 150 MG PO TB24: 150.0000 mg | ORAL_TABLET | Freq: Every day | ORAL | 1 refills | Status: DC

## 2023-03-13 ENCOUNTER — Encounter (INDEPENDENT_AMBULATORY_CARE_PROVIDER_SITE_OTHER): Payer: No Typology Code available for payment source | Admitting: Ophthalmology

## 2023-03-14 ENCOUNTER — Ambulatory Visit: Payer: No Typology Code available for payment source | Admitting: Family Medicine

## 2023-03-16 ENCOUNTER — Other Ambulatory Visit: Payer: Self-pay | Admitting: Family Medicine

## 2023-03-17 ENCOUNTER — Encounter: Payer: Self-pay | Admitting: Family Medicine

## 2023-03-17 NOTE — Telephone Encounter (Signed)
Last filled on 07/12/22 #60 caps, 5 refills, CPE 04/25/23

## 2023-03-25 ENCOUNTER — Other Ambulatory Visit (HOSPITAL_COMMUNITY): Payer: Self-pay | Admitting: Internal Medicine

## 2023-04-09 ENCOUNTER — Encounter: Payer: Self-pay | Admitting: Family Medicine

## 2023-04-10 ENCOUNTER — Encounter: Payer: Self-pay | Admitting: Family Medicine

## 2023-04-20 ENCOUNTER — Other Ambulatory Visit: Payer: Self-pay | Admitting: Family Medicine

## 2023-04-25 ENCOUNTER — Encounter: Payer: No Typology Code available for payment source | Admitting: Family Medicine

## 2023-04-25 DIAGNOSIS — E249 Cushing's syndrome, unspecified: Secondary | ICD-10-CM

## 2023-04-25 DIAGNOSIS — I10 Essential (primary) hypertension: Secondary | ICD-10-CM

## 2023-04-25 DIAGNOSIS — E1169 Type 2 diabetes mellitus with other specified complication: Secondary | ICD-10-CM

## 2023-04-25 DIAGNOSIS — Z0000 Encounter for general adult medical examination without abnormal findings: Secondary | ICD-10-CM

## 2023-04-25 DIAGNOSIS — E559 Vitamin D deficiency, unspecified: Secondary | ICD-10-CM

## 2023-04-25 DIAGNOSIS — E1140 Type 2 diabetes mellitus with diabetic neuropathy, unspecified: Secondary | ICD-10-CM

## 2023-04-25 DIAGNOSIS — E538 Deficiency of other specified B group vitamins: Secondary | ICD-10-CM

## 2023-04-25 NOTE — Progress Notes (Deleted)
 Subjective:    Patient ID: [name], female    DOB: 1965/06/03, 58 y.o.   MRN: [id number]  HPI  Here for health maintenance exam and to review chronic medical problems   Wt Readings from Last 3 Encounters:  01/31/23 255 lb (115.7 kg)  07/12/22 225 lb (102.1 kg)  07/01/22 230 lb 12.8 oz (104.7 kg)      There were no vitals filed for this visit.  Immunization History  Administered Date(s) Administered   Influenza Split 01/17/2012   Influenza Whole 12/23/2005, 12/23/2008, 01/03/2010   Influenza,inj,Quad PF,6+ Mos 12/27/2013, 01/23/2018, 01/19/2020, 11/24/2020   Influenza-Unspecified 12/24/2012, 02/01/2015, 12/28/2015, 01/03/2017, 12/11/2018, 12/24/2020, 01/04/2022, 12/27/2022   PFIZER Comirnaty(Gray Top)Covid-19 Tri-Sucrose Vaccine 09/01/2020   PFIZER(Purple Top)SARS-COV-2 Vaccination 08/05/2019, 08/26/2019, 03/03/2020   Pfizer(Comirnaty)Fall Seasonal Vaccine 12 years and older 02/09/2022, 02/10/2023   Pneumococcal Polysaccharide-23 01/31/2012, 08/08/2017   Tdap 02/24/2012   Zoster Recombinant(Shingrix) 01/29/2022, 04/19/2022    Health Maintenance Due  Topic Date Due   Diabetic kidney evaluation - Urine ACR  06/18/2016   Pneumococcal Vaccine 67-2 Years old (2 of 2 - PCV) 08/09/2018   Colonoscopy  08/31/2020   DTaP/Tdap/Td (2 - Td or Tdap) 02/23/2022   HEMOGLOBIN A1C  12/31/2022   OPHTHALMOLOGY EXAM  02/13/2023   COVID-19 Vaccine (7 - 2024-25 season) 04/07/2023     Mammogram Self breast exam  Gyn health   Prostate health No results found for: "PSA1", "PSA"    Colon cancer screening   Bone health  Dexa  Falls Fractures Supplements  Last vitamin D Lab Results  Component Value Date   VD25OH 28.13 (L) 01/23/2023    Exercise    Mood    01/22/2022   12:34 PM 11/24/2020    4:24 PM 10/01/2019    8:38 AM 08/08/2017   12:17 PM  Depression screen PHQ 2/9  Decreased Interest 2 3 1  0  Down, Depressed, Hopeless 2 1 2  0  PHQ - 2 Score 4 4 3  0   Altered sleeping 2 3 2 3   Tired, decreased energy 3 3 2 3   Change in appetite 2 2 1 3   Feeling bad or failure about yourself  1 2 0 0  Trouble concentrating 3 3 0 3  Moving slowly or fidgety/restless 2 1 0 1  Suicidal thoughts 1 1 0 1  PHQ-9 Score 18 19 8 14   Difficult doing work/chores   Somewhat difficult     HTN (in setting of chronic diastolic HF) bp is stable today  No cp or palpitations or headaches or edema  No side effects to medicines  BP Readings from Last 3 Encounters:  01/31/23 (!) 142/80  07/12/22 120/62  07/01/22 130/88     Spironolactone 25 mg daily  Coreg 6.25 mg bid Entresto 49-51 mg bid Torsemide 20 mg daily prn   Pulse Readings from Last 3 Encounters:  01/31/23 75  07/12/22 65  07/01/22 65   Uses cpap for OSA  Lab Results  Component Value Date   NA 141 01/23/2023   K 3.7 01/23/2023   CO2 32 01/23/2023   GLUCOSE 248 (H) 01/23/2023   BUN 15 01/23/2023   CREATININE 0.77 01/23/2023   CALCIUM 9.3 01/23/2023   GFR 85.74 01/23/2023   GFRNONAA >60 07/20/2021     GERD  Protinix 40 mg daily   Hyperlipidemia Lab Results  Component Value Date   CHOL 139 01/23/2023   HDL 42.60 01/23/2023   LDLCALC 52 01/23/2023   LDLDIRECT 97.0 01/15/2022  TRIG 225.0 (H) 01/23/2023   CHOLHDL 3 01/23/2023   Crestor 20 mg daily    Sees endocrinology for Cushing's and DM    Patient Active Problem List   Diagnosis Date Noted   Nostril sore 02/02/2023   Paresthesias 02/02/2023   Poor balance 01/31/2023   Muscle weakness 01/31/2023   Chronic headaches 01/31/2023   Vasomotor rhinitis 01/31/2023   Papillary thyroid carcinoma (HCC) 07/01/2022   Grief reaction 03/05/2022   Vitamin B12 deficiency 01/22/2022   Current use of proton pump inhibitor 11/24/2020   Colon cancer screening 11/24/2020   Vitamin D deficiency 09/27/2019   Lumbar radiculopathy 12/16/2018   Poorly controlled type 2 diabetes mellitus with peripheral neuropathy (HCC) 12/16/2018    Bilateral carpal tunnel syndrome 11/24/2018   Family history of MS (multiple sclerosis) 09/29/2018   Routine general medical examination at a health care facility 12/19/2013   Palpitations 02/03/2013   Postsurgical hypothyroidism 04/28/2012   History of thyroid cancer 04/27/2012   Cushing's syndrome (HCC) 04/27/2012   Chronic diastolic heart failure (HCC) 02/10/2012   Diastolic CHF (HCC) 01/25/2012   OSA (obstructive sleep apnea) just got cpap at home this past week 01/17/12 01/17/2012   Chronic allergic rhinitis 10/26/2010   Insomnia 10/26/2010   OBESITY, MORBID 04/27/2010   HEMORRHOIDS, INTERNAL, WITH BLEEDING 08/17/2009   GERD 04/27/2008   Essential hypertension 11/03/2007   Hyperlipidemia associated with type 2 diabetes mellitus (HCC) 05/14/2007   OTHER ORGANIC SLEEP DISORDERS 03/09/2007   Type 2 diabetes mellitus with diabetic neuropathy, unspecified (HCC) 09/11/2006   POLYCYSTIC OVARIES 09/11/2006   Iron deficiency anemia 09/11/2006   Depression with anxiety 09/11/2006   EXTERNAL HEMORRHOIDS 09/11/2006   Asthma 09/11/2006   Past Medical History:  Diagnosis Date   Allergy    SEASONAL   Anemia    iron deficiency   Anxiety    Arthritis    Asthma    a. 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   Cataract 02/23/2019   Dr. Charlotte Sanes @ [facility] Both eyes, small.   Cellulitis and abscess of leg 02/2014   CHF (congestive heart failure) (HCC)    Chronic kidney disease    /13   Complication of anesthesia    DIFFICULT WAKING- 2011   Depression    a. Stress reaction 08/2011 in multiple social stressors.   Diabetes mellitus    Dysrhythmia    Gastritis    GERD (gastroesophageal reflux disease)    Heart murmur    Hemorrhoid    Hyperlipidemia    Hypertension    Hyperthyroidism    Insomnia    Morbid obesity (HCC)    Neuromuscular disorder (HCC) 12/16/2018   Dr. Mitzie Na Neuro, mild L5 /S1 radiculopathy, mild DJD   OSA (obstructive  sleep apnea) 01/17/2012   Papillary thyroid carcinoma (HCC)    PCOS (polycystic ovarian syndrome)    Sleep apnea    C PAP   Thyroid cancer (HCC) 2014   Vertigo    Past Surgical History:  Procedure Laterality Date   CATARACT EXTRACTION Left 04/13/2020   CATARACT EXTRACTION Right 06/01/2020   CHOLECYSTECTOMY     EYE SURGERY     HEMORRHOID SURGERY     INTRAUTERINE DEVICE INSERTION  03/25/2009   SHOULDER SURGERY  06/23/2009   THYROIDECTOMY  04/10/2012   Procedure: THYROIDECTOMY;  Surgeon: Osborn Coho, MD;  [facility] OR;  Service: ENT;  Laterality: N/A;  Total Thyroidectomy   TONSILLECTOMY AND ADENOIDECTOMY  CHILD  TUBAL LIGATION     Social History   Tobacco Use   Smoking status: Former    Current packs/day: 0.00    Average packs/day: 0.2 packs/day for 5.0 years (1.0 ttl pk-yrs)    Types: Cigarettes    Start date: 03/26/1999    Quit date: 03/25/2004    Years since quitting: 19.0   Smokeless tobacco: Never  Vaping Use   Vaping status: Never Used  Substance Use Topics   Alcohol use: No   Drug use: No   Family History  Problem Relation Age of Onset   Arthritis Mother        osteoarthritis   Asthma Mother    Hypertension Mother    Multiple sclerosis Mother    Anxiety disorder Mother    Depression Mother    Vision loss Mother    Cancer Father        colon cancer   Arthritis Father        osteoarthritis   Colon cancer Father    Colon polyps Father    Transient ischemic attack Father    COPD Father    Asthma Father    Stroke Father    Vision loss Father    Heart disease Maternal Grandfather    Ovarian cancer Maternal Aunt    Diabetes Paternal Aunt    Diabetes Paternal Uncle    Alcohol abuse Son    Drug abuse Son    Early death Son    Stomach cancer Neg Hx    Esophageal cancer Neg Hx    Allergies  Allergen Reactions   Tape Rash    Plastic tape   Buspirone Hcl Nausea Only   Glipizide     REACTION: sleepy, low sugar   Ivp Dye [Iodinated Contrast  Media]     Shut kidneys down   Oxycodone-Acetaminophen Itching   Current Outpatient Medications on File Prior to Visit  Medication Sig Dispense Refill   Albuterol Sulfate (PROAIR RESPICLICK) 108 (90 Base) MCG/ACT AEPB Inhale 2 puffs into the lungs every 6 (six) hours as needed. 1 each 2   buPROPion (WELLBUTRIN XL) 150 MG 24 hr tablet Take 1 tablet (150 mg total) by mouth daily. 90 tablet 1   carvedilol (COREG) 6.25 MG tablet TAKE 1 TABLET 2 (TWO) TIMES DAILY WITH A MEAL. NEEDS FOLLOW UP APPOINTMENT FOR ANYMORE REFILLS 180 tablet 1   cetirizine (ZYRTEC) 10 MG tablet TAKE 1 TABLET BY MOUTH EVERY DAY 90 tablet 1   cholecalciferol (VITAMIN D3) 25 MCG (1000 UNIT) tablet Take 2,000 Units by mouth daily.     Continuous Blood Gluc Sensor (FREESTYLE LIBRE 3 SENSOR) MISC 1 each by Does not apply route every 14 (fourteen) days. 6 each 3   cyanocobalamin (VITAMIN B12) 500 MCG tablet Take 500 mcg by mouth daily.     dorzolamide (TRUSOPT) 2 % ophthalmic solution Place 1 drop into both eyes 2 (two) times daily.     empagliflozin (JARDIANCE) 25 MG TABS tablet Take 1 tablet (25 mg total) by mouth daily before breakfast. 90 tablet 3   ENTRESTO 49-51 MG TAKE 1 TABLET BY MOUTH 2 (TWO) TIMES DAILY. NEEDS FOLLOW UP APPOINTMENT FOR MORE REFILLS 60 tablet 0   ferrous sulfate 325 (65 FE) MG tablet Take 325 mg by mouth 2 (two) times daily.       fluticasone (FLONASE) 50 MCG/ACT nasal spray Place 2 sprays into both nostrils daily as needed for allergies. 16 g 11   hydrOXYzine (VISTARIL) 25 MG capsule  TAKE 1 CAPSULE BY MOUTH EVERY 8 HOURS AS NEEDED FOR ANXIETY OR ITCHING (INSOMNIA). 60 capsule 3   insulin aspart (NOVOLOG FLEXPEN) 100 UNIT/ML FlexPen INJECT 15-20 UNITS THREE TIMES A DAY BEFORE MEALS. 45 mL 0   insulin glargine, 1 Unit Dial, (TOUJEO SOLOSTAR) 300 UNIT/ML Solostar Pen Inject 90 Units into the skin daily. 18 mL 1   Insulin Pen Needle 32G X 4 MM MISC Use 5x a day 400 each 3   levothyroxine (SYNTHROID) 175 MCG  tablet TAKE 1 TABLET BY MOUTH EVERY MORNING ON AN EMPTY STOMACH 90 tablet 3   metFORMIN (GLUCOPHAGE) 1000 MG tablet Take 1 tablet (1,000 mg total) by mouth 2 (two) times daily with a meal. 180 tablet 3   pantoprazole (PROTONIX) 40 MG tablet TAKE 1 TABLET BY MOUTH EVERY DAY 90 tablet 1   potassium chloride (KLOR-CON) 20 MEQ packet Take 20 mEq by mouth as needed.     rosuvastatin (CRESTOR) 20 MG tablet TAKE 1 TABLET BY MOUTH EVERY DAY 90 tablet 0   spironolactone (ALDACTONE) 25 MG tablet Take 1 tablet (25 mg total) by mouth daily. NEEDS FOLLOW UP APPOINTMENT FOR MORE REFILLS 90 tablet 1   torsemide (DEMADEX) 20 MG tablet Take 1 tablet (20 mg total) by mouth daily as needed. 30 tablet 6   venlafaxine XR (EFFEXOR-XR) 75 MG 24 hr capsule TAKE 1 CAPSULE BY MOUTH DAILY WITH BREAKFAST. 90 capsule 3   zolpidem (AMBIEN) 10 MG tablet TAKE 1/2 TO 1 TABLET BY MOUTH AT BEDTIME AS NEEDED 30 tablet 3   No current facility-administered medications on file prior to visit.    Review of Systems     Objective:   Physical Exam        Assessment & Plan:   Problem List Items Addressed This Visit       Cardiovascular and Mediastinum   Essential hypertension     Endocrine   Type 2 diabetes mellitus with diabetic neuropathy, unspecified (HCC)   Hyperlipidemia associated with type 2 diabetes mellitus (HCC)   Cushing's syndrome (HCC)     Other   Vitamin D deficiency   Vitamin B12 deficiency   Routine general medical examination at a health care facility - Primary

## 2023-04-26 ENCOUNTER — Other Ambulatory Visit (HOSPITAL_COMMUNITY): Payer: Self-pay | Admitting: Internal Medicine

## 2023-04-28 ENCOUNTER — Encounter: Payer: Self-pay | Admitting: Family Medicine

## 2023-05-02 ENCOUNTER — Encounter: Payer: Self-pay | Admitting: Family Medicine

## 2023-05-02 NOTE — Telephone Encounter (Signed)
 lvmtcb

## 2023-05-23 ENCOUNTER — Other Ambulatory Visit: Payer: Self-pay | Admitting: Family Medicine

## 2023-05-24 ENCOUNTER — Other Ambulatory Visit (HOSPITAL_COMMUNITY): Payer: Self-pay | Admitting: Family Medicine

## 2023-05-30 ENCOUNTER — Other Ambulatory Visit: Payer: Self-pay | Admitting: Family Medicine

## 2023-05-30 ENCOUNTER — Telehealth: Payer: Self-pay

## 2023-05-30 NOTE — Telephone Encounter (Signed)
 Copied from CRM [id number]. Topic: Clinical - Medication Refill >> May 30, 2023  4:40 PM [name] wrote: Most Recent Primary Care Visit:  Provider: Roxy Manns A  Department: [facility]  Visit Type: OFFICE VISIT  Date: 01/31/2023  Medication: zolpidem (AMBIEN) 10 MG tablet  Has the patient contacted their pharmacy? Yes (Agent: If no, request that the patient contact the pharmacy for the refill. If patient does not wish to contact the pharmacy document the reason why and proceed with request.) (Agent: If yes, when and what did the pharmacy advise?)Pharmacy need PCP approval  Is this the correct pharmacy for this prescription? Yes If no, delete pharmacy and type the correct one.  This is the patient's preferred pharmacy:  [id number] [place], [place] - [address] [place] [address] Phone: [phone] Fax: [phone]   Has the prescription been filled recently? Yes  Is the patient out of the medication? Yes  Has the patient been seen for an appointment in the last year OR does the patient have an upcoming appointment? Yes  Can we respond through MyChart? Yes  Agent: Please be advised that Rx refills may take up to 3 business days. We ask that you follow-up with your pharmacy.

## 2023-05-30 NOTE — Telephone Encounter (Signed)
**   pt no-showed her last CPE and f/u appt.**  Name of Medication: Ambien Name of Pharmacy: [facility]  Last Fill or Written Date and Quantity: 01/28/23 #30 tab/ 3 refills Last Office Visit and Type: 01/31/23 multiple issues Next Office Visit and Type: none scheduled

## 2023-05-30 NOTE — Telephone Encounter (Signed)
 Copied from CRM [id number]. Topic: General - Other >> May 30, 2023  4:44 PM Eunice Blase wrote: Reason for CRM: Pt received letter for disability but lost employment. Please contact pt at  [phone].

## 2023-05-30 NOTE — Telephone Encounter (Signed)
 Duplicate request refill request sent to PCP

## 2023-06-02 NOTE — Telephone Encounter (Addendum)
 Last OV was 01/31/23 but no-showed last 2 appts., please advise

## 2023-06-02 NOTE — Telephone Encounter (Signed)
 Lvm to schedule

## 2023-06-02 NOTE — Telephone Encounter (Signed)
 Follow up if able? No showed last 2 appts or late canceled /not sure  So sorry to hear that

## 2023-06-25 ENCOUNTER — Other Ambulatory Visit: Payer: Self-pay | Admitting: Internal Medicine

## 2023-06-30 ENCOUNTER — Other Ambulatory Visit: Payer: Self-pay | Admitting: Family Medicine

## 2023-06-30 ENCOUNTER — Other Ambulatory Visit (HOSPITAL_COMMUNITY): Payer: Self-pay | Admitting: Internal Medicine

## 2023-06-30 ENCOUNTER — Other Ambulatory Visit: Payer: Self-pay | Admitting: Internal Medicine

## 2023-06-30 NOTE — Telephone Encounter (Signed)
 Please call her/ reschedule her appointment  Once scheduled send this back to me to refill  Thanks

## 2023-06-30 NOTE — Telephone Encounter (Signed)
 Pt no-showed last 2 appts with PCP, please advise

## 2023-07-01 NOTE — Telephone Encounter (Signed)
 Lvmtcb, sent mychart message

## 2023-07-01 NOTE — Telephone Encounter (Signed)
 Pt needs to be seen for more refills. She was last seen 06/2022 and was supposed to follow up ion 3-4 months.

## 2023-07-02 ENCOUNTER — Telehealth (HOSPITAL_COMMUNITY): Payer: Self-pay | Admitting: Internal Medicine

## 2023-07-02 NOTE — Telephone Encounter (Signed)
 Spoke to pt, pt states she's no longer working as of 2/28 & no longer has insurance. Pt requested info on self pay cost for visits, provided info to pt. Scheduled appt for 07/09/23

## 2023-07-02 NOTE — Telephone Encounter (Signed)
#  30 pills provided until appt

## 2023-07-02 NOTE — Telephone Encounter (Signed)
 Front office received message from Ardencroft T., CMA that this patient is overdue for a follow up appt in the clinic. Called patient and left patient a message to schedule with Dr. Gala Romney or the APP clinic.

## 2023-07-02 NOTE — Telephone Encounter (Signed)
 LMx1 to schedule appointmetn with Dr. Elvera Lennox.

## 2023-07-04 NOTE — Telephone Encounter (Signed)
 LMx2 to schedule follow up appointment with Dr. Elvera Lennox.

## 2023-07-07 NOTE — Telephone Encounter (Signed)
 LMx3 to schedule follow up appointment with Dr. Aldona Amel.

## 2023-07-09 ENCOUNTER — Ambulatory Visit (INDEPENDENT_AMBULATORY_CARE_PROVIDER_SITE_OTHER): Payer: Self-pay | Admitting: Family Medicine

## 2023-07-09 ENCOUNTER — Encounter: Payer: Self-pay | Admitting: Family Medicine

## 2023-07-09 VITALS — BP 138/82 | HR 70 | Temp 97.5°F | Ht 65.0 in | Wt 233.4 lb

## 2023-07-09 DIAGNOSIS — E249 Cushing's syndrome, unspecified: Secondary | ICD-10-CM

## 2023-07-09 DIAGNOSIS — Z7984 Long term (current) use of oral hypoglycemic drugs: Secondary | ICD-10-CM

## 2023-07-09 DIAGNOSIS — E785 Hyperlipidemia, unspecified: Secondary | ICD-10-CM

## 2023-07-09 DIAGNOSIS — E559 Vitamin D deficiency, unspecified: Secondary | ICD-10-CM

## 2023-07-09 DIAGNOSIS — E1140 Type 2 diabetes mellitus with diabetic neuropathy, unspecified: Secondary | ICD-10-CM

## 2023-07-09 DIAGNOSIS — E538 Deficiency of other specified B group vitamins: Secondary | ICD-10-CM

## 2023-07-09 DIAGNOSIS — E1169 Type 2 diabetes mellitus with other specified complication: Secondary | ICD-10-CM

## 2023-07-09 DIAGNOSIS — Z5986 Financial insecurity: Secondary | ICD-10-CM

## 2023-07-09 DIAGNOSIS — I10 Essential (primary) hypertension: Secondary | ICD-10-CM

## 2023-07-09 DIAGNOSIS — F418 Other specified anxiety disorders: Secondary | ICD-10-CM

## 2023-07-09 NOTE — Assessment & Plan Note (Signed)
 Disc goals for lipids and reasons to control them Rev last labs with pt Rev low sat fat diet in detail LDL 52 on crestor 20 mg  Goal under 70  Cost may become issue w/o insurance  Referred fo pharmacy services Using good RX card

## 2023-07-09 NOTE — Assessment & Plan Note (Signed)
 Referral made to pharmacy services Pt is without insurance until June 2026 with disability

## 2023-07-09 NOTE — Assessment & Plan Note (Signed)
 Overdue follow up with endocrinology

## 2023-07-09 NOTE — Assessment & Plan Note (Signed)
 Dealing better with grief  Change to snri /effexor xr 75 mg has helped  Continues wellbutrin xl 150 mg daily  Ambien prn   Some issues with med cost Disbility /no insurance currently  Not ready for counseling  [facility] may be option when she is ready   Encouraged self care

## 2023-07-09 NOTE — Assessment & Plan Note (Signed)
 bp is not quite at goal, but anxiety is high  BP Readings from Last 1 Encounters:  07/09/23 138/82   No changes needed Most recent labs reviewed  Disc lifstyle change with low sodium diet and exercise   Current medicines Spironolactone 25 mg daily  Coreg 6.25 mg bid (? Possible 3.25) Entresto bid 49-51 mg  Demadex prn   Planning cardiology visit  Labs reviewed   Currently no insurance /disability status  Will be unable to afford entresto for CHF Referral made to pharmacy services

## 2023-07-09 NOTE — Progress Notes (Signed)
 Subjective:    Patient ID: [name], female    DOB: 03-15-1966, 58 y.o.   MRN: [id number]  HPI  Wt Readings from Last 3 Encounters:  07/09/23 233 lb 6.4 oz (105.9 kg)  01/31/23 255 lb (115.7 kg)  07/12/22 225 lb (102.1 kg)   38.84 kg/m  Vitals:   07/09/23 0905  BP: 138/82  Pulse: 70  Temp: (!) 97.5 F (36.4 C)  SpO2: 97%    Pt presents for medicine refill  No insurance currently  Just found out about good RX  Was awarded SS disability but cannot get medicare for 2 years (that will be next June) Has applied to medicaid - this will take a while     HTN bp is stable today  No cp or palpitations or headaches or edema  No side effects to medicines  BP Readings from Last 3 Encounters:  07/09/23 138/82  01/31/23 (!) 142/80  07/12/22 120/62    Spironolactone 25 mg daily  Coreg 6.25 mg bid (? Possible 3.25) Entresto bid 49-51 mg - cannot afford  Demadex prn   Sees cardiologist for this and CHF / DD  Lab Results  Component Value Date   NA 141 01/23/2023   K 3.7 01/23/2023   CO2 32 01/23/2023   GLUCOSE 248 (H) 01/23/2023   BUN 15 01/23/2023   CREATININE 0.77 01/23/2023   CALCIUM 9.3 01/23/2023   GFR 85.74 01/23/2023   GFRNONAA >60 07/20/2021      DM2 and Cushing's syndrome and hypothyroidism  Sees endocrinology   Metformin  Toujeo insulin Humalog  Jardiance   Levothyroxine 175 mcg daily   Hyperlipidemia Lab Results  Component Value Date   CHOL 139 01/23/2023   HDL 42.60 01/23/2023   LDLCALC 52 01/23/2023   LDLDIRECT 97.0 01/15/2022   TRIG 225.0 (H) 01/23/2023   CHOLHDL 3 01/23/2023     Depression with anxiety in setting of grief (lost son in 2023)  Has been unable to do counseling   Has support outside of the office Has triggering moments   Wellbutrin xl 150 mg daily  Sertraline changed to effexor xr 75 mg at last visit , has done better with this   Ambien 5-10 mg prn sleep  Still struggles with sleep   GERD Was taking  protonix 40 mg daily  Hyperlipidemia Lab Results  Component Value Date   CHOL 139 01/23/2023   HDL 42.60 01/23/2023   LDLCALC 52 01/23/2023   LDLDIRECT 97.0 01/15/2022   TRIG 225.0 (H) 01/23/2023   CHOLHDL 3 01/23/2023     Patient Active Problem List   Diagnosis Date Noted   Patient cannot afford medications 07/09/2023   Nostril sore 02/02/2023   Paresthesias 02/02/2023   Poor balance 01/31/2023   Muscle weakness 01/31/2023   Chronic headaches 01/31/2023   Vasomotor rhinitis 01/31/2023   Papillary thyroid carcinoma (HCC) 07/01/2022   Grief reaction 03/05/2022   Vitamin B12 deficiency 01/22/2022   Current use of proton pump inhibitor 11/24/2020   Colon cancer screening 11/24/2020   Vitamin D deficiency 09/27/2019   Lumbar radiculopathy 12/16/2018   Poorly controlled type 2 diabetes mellitus with peripheral neuropathy (HCC) 12/16/2018   Bilateral carpal tunnel syndrome 11/24/2018   Family history of MS (multiple sclerosis) 09/29/2018   Routine general medical examination at a health care facility 12/19/2013   Palpitations 02/03/2013   Postsurgical hypothyroidism 04/28/2012   History of thyroid cancer 04/27/2012   Cushing's syndrome (HCC) 04/27/2012   Chronic diastolic heart  failure (HCC) 02/10/2012   Diastolic CHF (HCC) 01/25/2012   OSA (obstructive sleep apnea) just got cpap at home this past week 01/17/12 01/17/2012   Chronic allergic rhinitis 10/26/2010   Insomnia 10/26/2010   OBESITY, MORBID 04/27/2010   HEMORRHOIDS, INTERNAL, WITH BLEEDING 08/17/2009   GERD 04/27/2008   Essential hypertension 11/03/2007   Hyperlipidemia associated with type 2 diabetes mellitus (HCC) 05/14/2007   OTHER ORGANIC SLEEP DISORDERS 03/09/2007   Type 2 diabetes mellitus with diabetic neuropathy, unspecified (HCC) 09/11/2006   POLYCYSTIC OVARIES 09/11/2006   Iron deficiency anemia 09/11/2006   Depression with anxiety 09/11/2006   EXTERNAL HEMORRHOIDS 09/11/2006   Asthma 09/11/2006    Past Medical History:  Diagnosis Date   Allergy    SEASONAL   Anemia    iron deficiency   Anxiety    Arthritis    Asthma    a. 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   Cataract 02/23/2019   Dr. Charlotte Sanes @ [facility] Both eyes, small.   Cellulitis and abscess of leg 02/2014   CHF (congestive heart failure) (HCC)    Chronic kidney disease    /13   Complication of anesthesia    DIFFICULT WAKING- 2011   Depression    a. Stress reaction 08/2011 in multiple social stressors.   Diabetes mellitus    Dysrhythmia    Gastritis    GERD (gastroesophageal reflux disease)    Heart murmur    Hemorrhoid    Hyperlipidemia    Hypertension    Hyperthyroidism    Insomnia    Morbid obesity (HCC)    Neuromuscular disorder (HCC) 12/16/2018   Dr. Mitzie Na Neuro, mild L5 /S1 radiculopathy, mild DJD   OSA (obstructive sleep apnea) 01/17/2012   Papillary thyroid carcinoma (HCC)    PCOS (polycystic ovarian syndrome)    Sleep apnea    C PAP   Thyroid cancer (HCC) 2014   Vertigo    Past Surgical History:  Procedure Laterality Date   CATARACT EXTRACTION Left 04/13/2020   CATARACT EXTRACTION Right 06/01/2020   CHOLECYSTECTOMY     EYE SURGERY     HEMORRHOID SURGERY     INTRAUTERINE DEVICE INSERTION  03/25/2009   SHOULDER SURGERY  06/23/2009   THYROIDECTOMY  04/10/2012   Procedure: THYROIDECTOMY;  Surgeon: Osborn Coho, MD;  Location: [facility] OR;  Service: ENT;  Laterality: N/A;  Total Thyroidectomy   TONSILLECTOMY AND ADENOIDECTOMY  CHILD   TUBAL LIGATION     Social History   Tobacco Use   Smoking status: Former    Current packs/day: 0.00    Average packs/day: 0.2 packs/day for 5.0 years (1.0 ttl pk-yrs)    Types: Cigarettes    Start date: 03/26/1999    Quit date: 03/25/2004    Years since quitting: 19.3   Smokeless tobacco: Never  Vaping Use   Vaping status: Never Used  Substance Use Topics   Alcohol use: No   Drug use: No   Family History   Problem Relation Age of Onset   Arthritis Mother        osteoarthritis   Asthma Mother    Hypertension Mother    Multiple sclerosis Mother    Anxiety disorder Mother    Depression Mother    Vision loss Mother    Cancer Father        colon cancer   Arthritis Father        osteoarthritis   Colon cancer Father  Colon polyps Father    Transient ischemic attack Father    COPD Father    Asthma Father    Stroke Father    Vision loss Father    Heart disease Maternal Grandfather    Ovarian cancer Maternal Aunt    Diabetes Paternal Aunt    Diabetes Paternal Uncle    Alcohol abuse Son    Drug abuse Son    Early death Son    Stomach cancer Neg Hx    Esophageal cancer Neg Hx    Allergies  Allergen Reactions   Tape Rash    Plastic tape   Buspirone Hcl Nausea Only   Glipizide     REACTION: sleepy, low sugar   Ivp Dye [Iodinated Contrast Media]     Shut kidneys down   Oxycodone-Acetaminophen Itching   Current Outpatient Medications on File Prior to Visit  Medication Sig Dispense Refill   Albuterol Sulfate (PROAIR RESPICLICK) 108 (90 Base) MCG/ACT AEPB Inhale 2 puffs into the lungs every 6 (six) hours as needed. 1 each 2   buPROPion (WELLBUTRIN XL) 150 MG 24 hr tablet TAKE 1 TABLET BY MOUTH EVERY DAY 30 tablet 0   carvedilol (COREG) 6.25 MG tablet TAKE 1 TABLET 2 (TWO) TIMES DAILY WITH A MEAL. NEEDS FOLLOW UP APPOINTMENT FOR ANYMORE REFILLS 180 tablet 1   cetirizine (ZYRTEC) 10 MG tablet TAKE 1 TABLET BY MOUTH EVERY DAY 90 tablet 1   cholecalciferol (VITAMIN D3) 25 MCG (1000 UNIT) tablet Take 2,000 Units by mouth daily.     Continuous Blood Gluc Sensor (FREESTYLE LIBRE 3 SENSOR) MISC 1 each by Does not apply route every 14 (fourteen) days. 6 each 3   cyanocobalamin (VITAMIN B12) 500 MCG tablet Take 500 mcg by mouth daily.     dorzolamide (TRUSOPT) 2 % ophthalmic solution Place 1 drop into both eyes 2 (two) times daily.     empagliflozin (JARDIANCE) 25 MG TABS tablet Take 1  tablet (25 mg total) by mouth daily before breakfast. 90 tablet 3   ferrous sulfate 325 (65 FE) MG tablet Take 325 mg by mouth 2 (two) times daily.       fluticasone (FLONASE) 50 MCG/ACT nasal spray Place 2 sprays into both nostrils daily as needed for allergies. 16 g 11   hydrOXYzine (VISTARIL) 25 MG capsule TAKE 1 CAPSULE BY MOUTH EVERY 8 HOURS AS NEEDED FOR ANXIETY OR ITCHING (INSOMNIA). 60 capsule 3   insulin aspart (NOVOLOG FLEXPEN) 100 UNIT/ML FlexPen INJECT 15-20 UNITS THREE TIMES A DAY BEFORE MEALS. 45 mL 0   Insulin Pen Needle 32G X 4 MM MISC Use 5x a day 400 each 3   levothyroxine (SYNTHROID) 175 MCG tablet TAKE 1 TABLET BY MOUTH EVERY MORNING ON AN EMPTY STOMACH 90 tablet 3   metFORMIN (GLUCOPHAGE) 1000 MG tablet TAKE 1 TABLET (1,000 MG TOTAL) BY MOUTH TWICE A DAY WITH FOOD 60 tablet 0   pantoprazole (PROTONIX) 40 MG tablet TAKE 1 TABLET BY MOUTH EVERY DAY 90 tablet 1   potassium chloride (KLOR-CON) 20 MEQ packet Take 20 mEq by mouth as needed.     rosuvastatin (CRESTOR) 20 MG tablet TAKE 1 TABLET BY MOUTH EVERY DAY 90 tablet 1   spironolactone (ALDACTONE) 25 MG tablet TAKE 1 TABLET (25 MG TOTAL) BY MOUTH DAILY. NEEDS FOLLOW UP APPOINTMENT FOR MORE REFILLS 30 tablet 0   torsemide (DEMADEX) 20 MG tablet Take 1 tablet (20 mg total) by mouth daily as needed. 30 tablet 6   venlafaxine XR (  EFFEXOR-XR) 75 MG 24 hr capsule TAKE 1 CAPSULE BY MOUTH DAILY WITH BREAKFAST. 90 capsule 3   zolpidem (AMBIEN) 10 MG tablet TAKE 1/2 TO 1 TABLET BY MOUTH AT BEDTIME AS NEEDED 30 tablet 3   ENTRESTO 49-51 MG TAKE 1 TABLET BY MOUTH 2 (TWO) TIMES DAILY. NEEDS FOLLOW UP APPOINTMENT FOR MORE REFILLS (Patient not taking: Reported on 07/09/2023) 60 tablet 0   insulin glargine, 1 Unit Dial, (TOUJEO SOLOSTAR) 300 UNIT/ML Solostar Pen Inject 90 Units into the skin daily. (Patient not taking: Reported on 07/09/2023) 18 mL 1   No current facility-administered medications on file prior to visit.    Review of Systems   Constitutional:  Positive for fatigue. Negative for activity change, appetite change, fever and unexpected weight change.  HENT:  Negative for congestion, ear pain, rhinorrhea, sinus pressure and sore throat.   Eyes:  Negative for pain, redness and visual disturbance.  Respiratory:  Negative for cough, shortness of breath and wheezing.   Cardiovascular:  Negative for chest pain and palpitations.  Gastrointestinal:  Negative for abdominal pain, blood in stool, constipation and diarrhea.  Endocrine: Negative for polydipsia and polyuria.  Genitourinary:  Negative for dysuria, frequency and urgency.  Musculoskeletal:  Negative for arthralgias, back pain and myalgias.  Skin:  Negative for pallor and rash.  Allergic/Immunologic: Negative for environmental allergies.  Neurological:  Negative for dizziness, syncope and headaches.  Hematological:  Negative for adenopathy. Does not bruise/bleed easily.  Psychiatric/Behavioral:  Positive for dysphoric mood and sleep disturbance. Negative for decreased concentration. The patient is nervous/anxious.        Objective:   Physical Exam Constitutional:      General: She is not in acute distress.    Appearance: Normal appearance. She is well-developed. She is obese. She is not ill-appearing or diaphoretic.  HENT:     Head: Normocephalic and atraumatic.  Eyes:     Conjunctiva/sclera: Conjunctivae normal.     Pupils: Pupils are equal, round, and reactive to light.  Neck:     Thyroid: No thyromegaly.     Vascular: No carotid bruit or JVD.  Cardiovascular:     Rate and Rhythm: Normal rate and regular rhythm.     Heart sounds: Normal heart sounds.     No gallop.  Pulmonary:     Effort: Pulmonary effort is normal. No respiratory distress.     Breath sounds: Normal breath sounds. No wheezing or rales.  Abdominal:     General: There is no distension or abdominal bruit.     Palpations: Abdomen is soft.  Musculoskeletal:     Cervical back: Normal  range of motion and neck supple.     Right lower leg: No edema.     Left lower leg: No edema.  Lymphadenopathy:     Cervical: No cervical adenopathy.  Skin:    General: Skin is warm and dry.     Coloration: Skin is not pale.     Findings: No rash.  Neurological:     Mental Status: She is alert.     Coordination: Coordination normal.     Deep Tendon Reflexes: Reflexes are normal and symmetric. Reflexes normal.  Psychiatric:        Attention and Perception: Attention normal.        Mood and Affect: Mood normal. Affect is tearful.        Speech: Speech normal.        Behavior: Behavior normal.        Thought  Content: Thought content normal.        Cognition and Memory: Cognition and memory normal.     Comments: Tearful at times Overall improved   Candidly discusses symptoms and stressors             Assessment & Plan:   Problem List Items Addressed This Visit       Cardiovascular and Mediastinum   Essential hypertension - Primary   bp is not quite at goal, but anxiety is high  BP Readings from Last 1 Encounters:  07/09/23 138/82   No changes needed Most recent labs reviewed  Disc lifstyle change with low sodium diet and exercise   Current medicines Spironolactone 25 mg daily  Coreg 6.25 mg bid (? Possible 3.25) Entresto bid 49-51 mg  Demadex prn   Planning cardiology visit  Labs reviewed   Currently no insurance /disability status  Will be unable to afford entresto for CHF Referral made to pharmacy services        Relevant Orders   AMB Referral [facility] Care Management     Endocrine   Type 2 diabetes mellitus with diabetic neuropathy, unspecified (HCC)   Overdue for follow up with endocrinology (also for cushings and thyroid)  Continues  Metformin  Toujeo Humalog Jardiance  Has lost insurance and may not be able to continue affording meds Ref made to pharm services         Relevant Orders   AMB Referral [facility] Care Management   Hyperlipidemia  associated with type 2 diabetes mellitus (HCC)   Disc goals for lipids and reasons to control them Rev last labs with pt Rev low sat fat diet in detail LDL 52 on crestor 20 mg  Goal under 70  Cost may become issue w/o insurance  Referred fo pharmacy services Using good RX card       Relevant Orders   AMB Referral [facility] Care Management   Cushing's syndrome (HCC)   Overdue follow up with endocrinology         Other   Patient cannot afford medications   Referral made to pharmacy services Pt is without insurance until June 2026 with disability       Depression with anxiety   Dealing better with grief  Change to snri /effexor xr 75 mg has helped  Continues wellbutrin xl 150 mg daily  Ambien prn   Some issues with med cost Disbility /no insurance currently  Not ready for counseling  [facility] may be option when she is ready   Encouraged self care

## 2023-07-09 NOTE — Patient Instructions (Addendum)
 Try to follow up with endocrinology and cardiology when you can  Try to eat healthy Try to get most of your carbohydrates from produce (with the exception of white potatoes) and whole grains Eat less bread/pasta/rice/snack foods/cereals/sweets and other items from the middle of the grocery store (processed carbs) Avoid red meat/ fried foods/ egg yolks/ fatty breakfast meats/ butter, cheese and high fat dairy/ and shellfish    Stay as active as you can be   No medicine changes   I put the referral in for our pharmacy team to discuss medications and affordability Please let us  know if you don't hear in 1-2 weeks

## 2023-07-09 NOTE — Assessment & Plan Note (Signed)
 Overdue for follow up with endocrinology (also for cushings and thyroid)  Continues  Metformin  Toujeo Humalog Jardiance  Has lost insurance and may not be able to continue affording meds Ref made to pharm services

## 2023-07-20 ENCOUNTER — Other Ambulatory Visit: Payer: Self-pay | Admitting: Internal Medicine

## 2023-07-23 ENCOUNTER — Telehealth: Payer: Self-pay | Admitting: *Deleted

## 2023-07-23 NOTE — Progress Notes (Signed)
 Care Guide Pharmacy Note  07/23/2023 Name: [name] MRN: [id number] DOB: December 04, 1965  Referred By: Clemens Curt, MD Reason for referral: No chief complaint on file.   [name] is a 58 y.o. year old female who is a primary care patient of Tower, Manley Seeds, MD.  [name] was referred to the pharmacist for assistance related to: HTN, HLD, and DMII  An unsuccessful telephone outreach was attempted today to contact the patient who was referred to the pharmacy team for assistance with medication assistance. Additional attempts will be made to contact the patient.  [name]  [facility]  [facility], [facility] Guide  Direct Dial : [phone]  Fax [phone]

## 2023-07-24 NOTE — Progress Notes (Signed)
 Care Guide Pharmacy Note  07/24/2023 Name: [name] MRN: [id number] DOB: 06-06-1965  Referred By: Clemens Curt, MD Reason for referral: Complex Care Management (Initial outreach to schedule with PharmD [facility]  POD 3)   [name] is a 58 y.o. year old female who is a primary care patient of Tower, Manley Seeds, MD.  [name] was referred to the pharmacist for assistance related to: HTN, HLD, and DMII  A second unsuccessful telephone outreach was attempted today to contact the patient who was referred to the pharmacy team for assistance with medication assistance. Additional attempts will be made to contact the patient.  [name]  [facility]  [facility], [facility] Guide  Direct Dial : [phone]  Fax [phone]

## 2023-07-25 NOTE — Progress Notes (Signed)
 Care Guide Pharmacy Note  07/25/2023 Name: [name] MRN: [id number] DOB: [birth year]  Referred By: Clemens Curt, MD Reason for referral: Complex Care Management (Initial outreach to schedule with PharmD [facility] [place] POD 3)   [name] is a 58 y.o. year old female who is a primary care patient of Tower, Manley Seeds, MD.  [name] was referred to the pharmacist for assistance related to: HTN, HLD, and DMII  A third unsuccessful telephone outreach was attempted today to contact the patient who was referred to the pharmacy team for assistance with medication assistance. The Population Health team is pleased to engage with this patient at any time in the future upon receipt of referral and should he/she be interested in assistance from the Population Health team.  [name]  [facility]  Value[facility], [facility] Guide  Direct Dial : [phone]  Fax [phone]

## 2023-08-21 ENCOUNTER — Other Ambulatory Visit: Payer: Self-pay | Admitting: Family Medicine

## 2023-08-22 MED ORDER — PANTOPRAZOLE SODIUM 40 MG PO TBEC: 40.0000 mg | DELAYED_RELEASE_TABLET | Freq: Every day | ORAL | 2 refills | Status: AC

## 2023-08-22 NOTE — Addendum Note (Signed)
 Addended by: Queenie Brunet on: 08/22/2023 09:52 AM   Modules accepted: Orders

## 2023-08-30 ENCOUNTER — Other Ambulatory Visit: Payer: Self-pay | Admitting: Internal Medicine

## 2023-09-04 ENCOUNTER — Telehealth: Payer: Self-pay | Admitting: Internal Medicine

## 2023-09-04 NOTE — Telephone Encounter (Signed)
 Pt requested appt,called and LM to schedule.

## 2023-09-17 ENCOUNTER — Other Ambulatory Visit: Payer: Self-pay | Admitting: Family Medicine

## 2023-09-18 ENCOUNTER — Other Ambulatory Visit (HOSPITAL_COMMUNITY): Payer: Self-pay | Admitting: Internal Medicine

## 2023-09-18 NOTE — Telephone Encounter (Signed)
 Last filled on 03/17/23 #60 caps/ 3 refills  Last OV was f/u on 07/09/23

## 2023-09-20 ENCOUNTER — Other Ambulatory Visit: Payer: Self-pay

## 2023-09-20 ENCOUNTER — Encounter (HOSPITAL_COMMUNITY): Payer: Self-pay

## 2023-09-20 ENCOUNTER — Emergency Department (HOSPITAL_COMMUNITY)
Admission: EM | Admit: 2023-09-20 | Discharge: 2023-09-20 | Disposition: A | Discharge status: Home / Self Care | Payer: Self-pay | Attending: Emergency Medicine | Admitting: Emergency Medicine

## 2023-09-20 DIAGNOSIS — R112 Nausea with vomiting, unspecified: Secondary | ICD-10-CM | POA: Insufficient documentation

## 2023-09-20 DIAGNOSIS — R197 Diarrhea, unspecified: Secondary | ICD-10-CM | POA: Insufficient documentation

## 2023-09-20 DIAGNOSIS — R1032 Left lower quadrant pain: Secondary | ICD-10-CM | POA: Insufficient documentation

## 2023-09-20 DIAGNOSIS — Z794 Long term (current) use of insulin: Secondary | ICD-10-CM | POA: Insufficient documentation

## 2023-09-20 DIAGNOSIS — Z7984 Long term (current) use of oral hypoglycemic drugs: Secondary | ICD-10-CM | POA: Insufficient documentation

## 2023-09-20 LAB — COMPREHENSIVE METABOLIC PANEL WITH GFR
ALT: 18 U/L (ref 0–44)
AST: 19 U/L (ref 15–41)
Albumin: 4.1 g/dL (ref 3.5–5.0)
Alkaline Phosphatase: 68 U/L (ref 38–126)
Anion gap: 13 (ref 5–15)
BUN: 9 mg/dL (ref 6–20)
CO2: 22 mmol/L (ref 22–32)
Calcium: 9.2 mg/dL (ref 8.9–10.3)
Chloride: 96 mmol/L — ABNORMAL LOW (ref 98–111)
Creatinine, Ser: 0.82 mg/dL (ref 0.44–1.00)
GFR, Estimated: >60 mL/min (ref >60)
Glucose, Bld: 408 mg/dL — ABNORMAL HIGH (ref 70–99)
Potassium: 3.3 mmol/L — ABNORMAL LOW (ref 3.5–5.1)
Sodium: 131 mmol/L — ABNORMAL LOW (ref 135–145)
Total Bilirubin: 1.3 mg/dL — ABNORMAL HIGH (ref 0.0–1.2)
Total Protein: 7.1 g/dL (ref 6.5–8.1)

## 2023-09-20 LAB — CBC
HCT: 47 % — ABNORMAL HIGH (ref 36.0–46.0)
Hemoglobin: 16 g/dL — ABNORMAL HIGH (ref 12.0–15.0)
MCH: 30.2 pg (ref 26.0–34.0)
MCHC: 34 g/dL (ref 30.0–36.0)
MCV: 88.8 fL (ref 80.0–100.0)
Platelets: 263 10*3/uL (ref 150–400)
RBC: 5.29 MIL/uL — ABNORMAL HIGH (ref 3.87–5.11)
RDW: 12.6 % (ref 11.5–15.5)
WBC: 9.3 10*3/uL (ref 4.0–10.5)
nRBC: 0 % (ref 0.0–0.2)

## 2023-09-20 LAB — URINALYSIS, ROUTINE W REFLEX MICROSCOPIC
Bilirubin Urine: NEGATIVE
Glucose, UA: >=500 mg/dL — AB
Hgb urine dipstick: NEGATIVE
Ketones, ur: 20 mg/dL — AB
Nitrite: NEGATIVE
Protein, ur: 30 mg/dL — AB
Specific Gravity, Urine: 1.035 — ABNORMAL HIGH (ref 1.005–1.030)
pH: 5 (ref 5.0–8.0)

## 2023-09-20 LAB — LIPASE, BLOOD: Lipase: 29 U/L (ref 11–51)

## 2023-09-20 LAB — CBG MONITORING, ED: Glucose-Capillary: 403 mg/dL — ABNORMAL HIGH (ref 70–99)

## 2023-09-20 MED ORDER — LOPERAMIDE HCL 2 MG PO CAPS
4.0000 mg | ORAL_CAPSULE | Freq: Once | ORAL | Status: AC
  Administered 2023-09-20: 4 mg via ORAL
  Filled 2023-09-20: qty 2

## 2023-09-20 MED ORDER — METOCLOPRAMIDE HCL 5 MG/ML IJ SOLN
10.0000 mg | Freq: Once | INTRAMUSCULAR | Status: AC
  Administered 2023-09-20: 10 mg via INTRAVENOUS
  Filled 2023-09-20: qty 2

## 2023-09-20 MED ORDER — DIPHENHYDRAMINE HCL 50 MG/ML IJ SOLN
25.0000 mg | Freq: Once | INTRAMUSCULAR | Status: AC
  Administered 2023-09-20: 25 mg via INTRAVENOUS
  Filled 2023-09-20: qty 1

## 2023-09-20 MED ORDER — ONDANSETRON 4 MG PO TBDP: ORAL_TABLET | ORAL | 0 refills | Status: DC

## 2023-09-20 MED ORDER — ONDANSETRON 4 MG PO TBDP
4.0000 mg | ORAL_TABLET | Freq: Once | ORAL | Status: AC | PRN
  Administered 2023-09-20: 4 mg via ORAL
  Filled 2023-09-20: qty 1

## 2023-09-20 MED ORDER — SODIUM CHLORIDE 0.9 % IV BOLUS
1000.0000 mL | Freq: Once | INTRAVENOUS | Status: AC
  Administered 2023-09-20: 1000 mL via INTRAVENOUS

## 2023-09-20 NOTE — ED Triage Notes (Signed)
 Pt states she has had nausea and vomiting for past 3 days. Pt states movement makes it worse and she has not been able to keep anything down. Pt states her eyes feel heavy and she is unable to read or watch tv or it makes the nausea worse. Pt has had chills, denies fevers.

## 2023-09-20 NOTE — ED Provider Notes (Signed)
 [name] EMERGENCY DEPARTMENT AT [facility] Provider Note   CSN: [id number] Arrival date & time: 09/20/23  9144     Patient presents with: Emesis   [name] is a 58 y.o. female.   58 yo F with a cc of n/v/d.  Going on for about 3 days.  No known sick contacts no recent travel.  Mild crampy abdominal pain diffuse but perhaps worse in the left lower quadrant.  No dark or bloody stools.  No fevers.   Emesis      Prior to Admission medications   Medication Sig Start Date End Date Taking? Authorizing Provider  ondansetron  (ZOFRAN -ODT) 4 MG disintegrating tablet 4mg  ODT q4 hours prn nausea/vomit 09/20/23  Yes Emil Share, DO  Albuterol  Sulfate (PROAIR  RESPICLICK) 108 (90 Base) MCG/ACT AEPB Inhale 2 puffs into the lungs every 6 (six) hours as needed. 03/05/22   Tower, Laine LABOR, MD  buPROPion  (WELLBUTRIN  XL) 150 MG 24 hr tablet TAKE 1 TABLET BY MOUTH EVERY DAY 07/02/23   Tower, Laine LABOR, MD  carvedilol  (COREG ) 6.25 MG tablet TAKE 1 TABLET 2 (TWO) TIMES DAILY WITH A MEAL. NEEDS FOLLOW UP APPOINTMENT FOR ANYMORE REFILLS 05/26/23   Bensimhon, Toribio SAUNDERS, MD  cetirizine  (ZYRTEC ) 10 MG tablet TAKE 1 TABLET BY MOUTH EVERY DAY 04/21/23   Tower, Laine LABOR, MD  cholecalciferol (VITAMIN D3) 25 MCG (1000 UNIT) tablet Take 2,000 Units by mouth daily.    [provider]  Continuous Blood Gluc Sensor (FREESTYLE LIBRE 3 SENSOR) MISC 1 each by Does not apply route every 14 (fourteen) days. 07/01/22   Trixie File, MD  cyanocobalamin  (VITAMIN B12) 500 MCG tablet Take 500 mcg by mouth daily.    [provider]  dorzolamide (TRUSOPT) 2 % ophthalmic solution Place 1 drop into both eyes 2 (two) times daily.    [provider]  empagliflozin  (JARDIANCE ) 25 MG TABS tablet Take 1 tablet (25 mg total) by mouth daily before breakfast. 07/01/22   Trixie File, MD  ENTRESTO  49-51 MG TAKE 1 TABLET BY MOUTH 2 (TWO) TIMES DAILY. NEEDS FOLLOW UP APPOINTMENT FOR MORE  REFILLS Patient not taking: Reported on 07/09/2023 04/29/23   Bensimhon, Toribio SAUNDERS, MD  ferrous sulfate  325 (65 FE) MG tablet Take 325 mg by mouth 2 (two) times daily.      [provider]  fluticasone  (FLONASE ) 50 MCG/ACT nasal spray Place 2 sprays into both nostrils daily as needed for allergies. 03/05/22   Tower, Laine LABOR, MD  hydrOXYzine  (VISTARIL ) 25 MG capsule TAKE 1 CAPSULE BY MOUTH EVERY 8 HOURS AS NEEDED FOR ANXIETY OR ITCHING (INSOMNIA). 09/18/23   Tower, Laine LABOR, MD  insulin  aspart (NOVOLOG  FLEXPEN) 100 UNIT/ML FlexPen INJECT 15-20 UNITS THREE TIMES A DAY BEFORE MEALS. 01/07/23   Motwani, Komal, MD  insulin  glargine, 1 Unit Dial , (TOUJEO  SOLOSTAR) 300 UNIT/ML Solostar Pen Inject 90 Units into the skin daily. Patient not taking: Reported on 07/09/2023 01/01/23   Trixie File, MD  Insulin  Pen Needle 32G X 4 MM MISC Use 5x a day 07/01/22   Trixie File, MD  levothyroxine  (SYNTHROID ) 175 MCG tablet TAKE 1 TABLET BY MOUTH EVERY MORNING ON AN EMPTY STOMACH 07/01/22   Trixie File, MD  metFORMIN  (GLUCOPHAGE ) 1000 MG tablet TAKE 1 TABLET (1,000 MG TOTAL) BY MOUTH TWICE A DAY WITH FOOD 06/25/23   Trixie File, MD  pantoprazole  (PROTONIX ) 40 MG tablet Take 1 tablet (40 mg total) by mouth daily. 08/22/23   Tower, Laine LABOR, MD  potassium chloride  (KLOR-CON ) 20 MEQ packet Take 20 mEq by mouth as needed.    [provider]  rosuvastatin  (CRESTOR ) 20 MG tablet TAKE 1 TABLET BY MOUTH EVERY DAY 05/23/23   Tower, Laine LABOR, MD  spironolactone  (ALDACTONE ) 25 MG tablet TAKE 1 TABLET (25 MG TOTAL) BY MOUTH DAILY. NEEDS FOLLOW UP APPOINTMENT FOR MORE REFILLS 07/02/23   Bensimhon, Toribio SAUNDERS, MD  torsemide  (DEMADEX ) 20 MG tablet Take 1 tablet (20 mg total) by mouth daily as needed. 06/26/18   Claudene Rosina HERO, NP  venlafaxine  XR (EFFEXOR -XR) 75 MG 24 hr capsule TAKE 1 CAPSULE BY MOUTH DAILY WITH BREAKFAST. 02/24/23   Tower, Laine LABOR, MD  zolpidem  (AMBIEN ) 10 MG tablet TAKE 1/2 TO 1 TABLET BY MOUTH AT  BEDTIME AS NEEDED 05/30/23   Tower, Laine LABOR, MD    Allergies: Tape, Buspirone hcl, Glipizide, Ivp dye [iodinated contrast media], and Oxycodone-acetaminophen     Review of Systems  Gastrointestinal:  Positive for vomiting.    Updated Vital Signs BP (!) 145/74 (BP Location: Left Arm)   Pulse 83   Temp 98.8 F (37.1 C) (Oral)   Resp 16   Ht 5' 5 (1.651 m)   Wt 98.4 kg   LMP 09/23/2015 (Approximate)   SpO2 100%   BMI 36.11 kg/m   Physical Exam Vitals and nursing note reviewed.  Constitutional:      General: She is not in acute distress.    Appearance: She is well-developed. She is not diaphoretic.  HENT:     Head: Normocephalic and atraumatic.   Eyes:     Pupils: Pupils are equal, round, and reactive to light.    Cardiovascular:     Rate and Rhythm: Normal rate and regular rhythm.     Heart sounds: No murmur heard.    No friction rub. No gallop.  Pulmonary:     Effort: Pulmonary effort is normal.     Breath sounds: No wheezing or rales.  Abdominal:     General: There is no distension.     Palpations: Abdomen is soft.     Tenderness: There is no abdominal tenderness.     Comments: Benign abdominal exam.    Musculoskeletal:        General: No tenderness.     Cervical back: Normal range of motion and neck supple.   Skin:    General: Skin is warm and dry.   Neurological:     Mental Status: She is alert and oriented to person, place, and time.   Psychiatric:        Behavior: Behavior normal.     (all labs ordered are listed, but only abnormal results are displayed) Labs Reviewed  COMPREHENSIVE METABOLIC PANEL WITH GFR - Abnormal; Notable for the following components:      Result Value   Sodium 131 (*)    Potassium 3.3 (*)    Chloride 96 (*)    Glucose, Bld 408 (*)    Total Bilirubin 1.3 (*)    All other components within normal limits  CBC - Abnormal; Notable for the following components:   RBC 5.29 (*)    Hemoglobin 16.0 (*)    HCT 47.0 (*)    All  other components within normal limits  URINALYSIS, ROUTINE W REFLEX MICROSCOPIC - Abnormal; Notable for the following components:   APPearance HAZY (*)    Specific Gravity, Urine 1.035 (*)    Glucose, UA >=500 (*)    Ketones, ur 20 (*)  Protein, ur 30 (*)    Leukocytes,Ua SMALL (*)    Bacteria, UA RARE (*)    All other components within normal limits  CBG MONITORING, ED - Abnormal; Notable for the following components:   Glucose-Capillary 403 (*)    All other components within normal limits  LIPASE, BLOOD    EKG: EKG Interpretation Date/Time:  Saturday September 20 2023 09:21:54 EDT Ventricular Rate:  88 PR Interval:  154 QRS Duration:  100 QT Interval:  398 QTC Calculation: 481 R Axis:   105  Text Interpretation: Normal sinus rhythm Rightward axis Possible Anterior infarct , age undetermined Abnormal ECG No significant change since last tracing Confirmed by Emil Share [phone]) on 09/20/2023 1:00:35 PM  Radiology: No results found.   Procedures   Medications Ordered in the ED  ondansetron  (ZOFRAN -ODT) disintegrating tablet 4 mg (4 mg Oral Given 09/20/23 0910)  sodium chloride  0.9 % bolus 1,000 mL (0 mLs Intravenous Stopped 09/20/23 1415)  metoCLOPramide  (REGLAN ) injection 10 mg (10 mg Intravenous Given 09/20/23 1122)  diphenhydrAMINE  (BENADRYL ) injection 25 mg (25 mg Intravenous Given 09/20/23 1121)  loperamide  (IMODIUM ) capsule 4 mg (4 mg Oral Given 09/20/23 1128)                                    Medical Decision Making Amount and/or Complexity of Data Reviewed Labs: ordered.  Risk Prescription drug management.   58 yo F with a cc of nausea vomiting and diarrhea.  This was going on for a few days.  Also a bit dizzy and having a bit of headache.  She was given a headache cocktail here IV fluids with significant improvement.  Able to ambulate to the bathroom independently.  UA negative for infection.  No anemia.  No significant electrolyte abnormalities  Able to tolerate  by mouth.  Will discharge home.  PCP follow-up.  3:16 PM:  I have discussed the diagnosis/risks/treatment options with the patient and family.  Evaluation and diagnostic testing in the emergency department does not suggest an emergent condition requiring admission or immediate intervention beyond what has been performed at this time.  They will follow up with PCP. We also discussed returning to the ED immediately if new or worsening sx occur. We discussed the sx which are most concerning (e.g., sudden worsening pain, fever, inability to tolerate by mouth) that necessitate immediate return. Medications administered to the patient during their visit and any new prescriptions provided to the patient are listed below.  Medications given during this visit Medications  ondansetron  (ZOFRAN -ODT) disintegrating tablet 4 mg (4 mg Oral Given 09/20/23 0910)  sodium chloride  0.9 % bolus 1,000 mL (0 mLs Intravenous Stopped 09/20/23 1415)  metoCLOPramide  (REGLAN ) injection 10 mg (10 mg Intravenous Given 09/20/23 1122)  diphenhydrAMINE  (BENADRYL ) injection 25 mg (25 mg Intravenous Given 09/20/23 1121)  loperamide  (IMODIUM ) capsule 4 mg (4 mg Oral Given 09/20/23 1128)     The patient appears reasonably screen and/or stabilized for discharge and I doubt any other medical condition or other [facility] requiring further screening, evaluation, or treatment in the ED at this time prior to discharge.       Final diagnoses:  Nausea vomiting and diarrhea    ED Discharge Orders          Ordered    ondansetron  (ZOFRAN -ODT) 4 MG disintegrating tablet        09/20/23 1451  Emil Share, DO 09/20/23 [phone]

## 2023-09-20 NOTE — Discharge Instructions (Addendum)
 Take the nausea medicine for nausea.  You can take Imodium  for diarrhea.  This is over-the-counter.  Follow instructions on the bottle.  I think you are likely dehydrated.  Try to increase your fluid intake at home.  Please return for worsening pain fever inability eat or drink.

## 2023-09-20 NOTE — ED Notes (Signed)
 Pt in bed, pt states that she is feeling better, but is till a little dizzy and has a slight headache.

## 2023-09-20 NOTE — ED Notes (Signed)
Pt in bed with eyes closed, resps even and unlabored.  

## 2023-09-21 ENCOUNTER — Encounter: Payer: Self-pay | Admitting: Family Medicine

## 2023-09-22 NOTE — Telephone Encounter (Signed)
 Looks like sodium and potassium were low from vomiting and diarrhea and bilirubin was mildly elevated  Please schedule follow up in office for re eval when able with first available  Go back ot ER if severe  Thanks for the heads up

## 2023-09-24 ENCOUNTER — Encounter: Payer: Self-pay | Admitting: Family Medicine

## 2023-09-24 ENCOUNTER — Ambulatory Visit (INDEPENDENT_AMBULATORY_CARE_PROVIDER_SITE_OTHER): Payer: Self-pay | Admitting: Family Medicine

## 2023-09-24 VITALS — BP 130/70 | HR 67 | Temp 97.6°F | Ht 65.0 in | Wt 219.1 lb

## 2023-09-24 DIAGNOSIS — R112 Nausea with vomiting, unspecified: Secondary | ICD-10-CM | POA: Diagnosis not present

## 2023-09-24 DIAGNOSIS — E1165 Type 2 diabetes mellitus with hyperglycemia: Secondary | ICD-10-CM

## 2023-09-24 DIAGNOSIS — E1142 Type 2 diabetes mellitus with diabetic polyneuropathy: Secondary | ICD-10-CM

## 2023-09-24 DIAGNOSIS — R197 Diarrhea, unspecified: Secondary | ICD-10-CM

## 2023-09-24 NOTE — Addendum Note (Signed)
 Addended by: LORELLE ROCKY BRAVO on: 09/24/2023 04:59 PM   Modules accepted: Orders

## 2023-09-24 NOTE — Progress Notes (Signed)
 Subjective:    Patient ID: [name], female    DOB: June 24, 1965, 58 y.o.   MRN: [id number]  HPI  Wt Readings from Last 3 Encounters:  09/24/23 219 lb 2 oz (99.4 kg)  09/20/23 217 lb (98.4 kg)  07/09/23 233 lb 6.4 oz (105.9 kg)   36.46 kg/m  Vitals:   09/24/23 0830 09/24/23 0855  BP: (!) 132/90 130/70  Pulse: 67   Temp: 97.6 F (36.4 C)   SpO2: 99%     Pt presents for follow up of ER visit for emesis on 09/20/23 Presented with 3 d of n/v/d and crampy abd pain but not bloody stools or fevers Some dizziness and headaches   Lab Results  Component Value Date   NA 131 (L) 09/20/2023   K 3.3 (L) 09/20/2023   CO2 22 09/20/2023   GLUCOSE 408 (H) 09/20/2023   BUN 9 09/20/2023   CREATININE 0.82 09/20/2023   CALCIUM  9.2 09/20/2023   GFR 85.74 01/23/2023   GFRNONAA >60 09/20/2023   Lab Results  Component Value Date   ALT 18 09/20/2023   AST 19 09/20/2023   ALKPHOS 68 09/20/2023   BILITOT 1.3 (H) 09/20/2023   Lab Results  Component Value Date   WBC 9.3 09/20/2023   HGB 16.0 (H) 09/20/2023   HCT 47.0 (H) 09/20/2023   MCV 88.8 09/20/2023   PLT 263 09/20/2023   Urinalysis- glucose/ small leuk and ketones Lab Results  Component Value Date   LIPASE 29 09/20/2023    Glucose ws 403  Given Zofran   Metoclopramide  Benadryl   Loperamids and fluids    Still sick A little better than last week   Still vomiting yellow/green bile  Diarrhea is yellow  Feels terrible   Drinking gatorade  Had some chicken noodle soup last night  Vomited this am (first time since Saturday)  Taking zofran  for nausea   Some chills but no fever   Diarrhea 2-3 times per day    Some low abd pain worse on left than the right    Still mild headache  Still some dizziness   Exhausted   No insurance right now  Signed up with [employer] -did not bring card with her      Patient Active Problem List   Diagnosis Date Noted   Patient cannot afford medications 07/09/2023    Nostril sore 02/02/2023   Paresthesias 02/02/2023   Poor balance 01/31/2023   Muscle weakness 01/31/2023   Chronic headaches 01/31/2023   Vasomotor rhinitis 01/31/2023   Papillary thyroid  carcinoma (HCC) 07/01/2022   Grief reaction 03/05/2022   Vitamin B12 deficiency 01/22/2022   Current use of proton pump inhibitor 11/24/2020   Colon cancer screening 11/24/2020   Vitamin D  deficiency 09/27/2019   Lumbar radiculopathy 12/16/2018   Poorly controlled type 2 diabetes mellitus with peripheral neuropathy (HCC) 12/16/2018   Bilateral carpal tunnel syndrome 11/24/2018   Family history of MS (multiple sclerosis) 09/29/2018   Routine general medical examination at a health care facility 12/19/2013   Palpitations 02/03/2013   Postsurgical hypothyroidism 04/28/2012   History of thyroid  cancer 04/27/2012   Cushing's syndrome (HCC) 04/27/2012   Chronic diastolic heart failure (HCC) 02/10/2012   Nausea vomiting and diarrhea 01/25/2012   Diastolic CHF (HCC) 01/25/2012   OSA (obstructive sleep apnea) just got cpap at home this past week 01/17/12 01/17/2012   Chronic allergic rhinitis 10/26/2010   Insomnia 10/26/2010   OBESITY, MORBID 04/27/2010   HEMORRHOIDS, INTERNAL, WITH BLEEDING 08/17/2009  GERD 04/27/2008   Essential hypertension 11/03/2007   Hyperlipidemia associated with type 2 diabetes mellitus (HCC) 05/14/2007   OTHER ORGANIC SLEEP DISORDERS 03/09/2007   Type 2 diabetes mellitus with diabetic neuropathy, unspecified (HCC) 09/11/2006   POLYCYSTIC OVARIES 09/11/2006   Iron deficiency anemia 09/11/2006   Depression with anxiety 09/11/2006   EXTERNAL HEMORRHOIDS 09/11/2006   Asthma 09/11/2006   Past Medical History:  Diagnosis Date   Allergy    SEASONAL   Anemia    iron deficiency   Anxiety    Arthritis    Asthma    a. 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   Cataract 02/23/2019   Dr. McCuen @ [facility] Both eyes, small.   Cellulitis  and abscess of leg 02/2014   CHF (congestive heart failure) (HCC)    Chronic kidney disease    /13   Complication of anesthesia    DIFFICULT WAKING- 2011   Depression    a. Stress reaction 08/2011 in multiple social stressors.   Diabetes mellitus    Dysrhythmia    Gastritis    GERD (gastroesophageal reflux disease)    Heart murmur    Hemorrhoid    Hyperlipidemia    Hypertension    Hyperthyroidism    Insomnia    Morbid obesity (HCC)    Neuromuscular disorder (HCC) 12/16/2018   Dr. Vear [facility], mild L5 /S1 radiculopathy, mild DJD   OSA (obstructive sleep apnea) 01/17/2012   Papillary thyroid  carcinoma (HCC)    PCOS (polycystic ovarian syndrome)    Sleep apnea    C PAP   Thyroid  cancer (HCC) 2014   Vertigo    Past Surgical History:  Procedure Laterality Date   CATARACT EXTRACTION Left 04/13/2020   CATARACT EXTRACTION Right 06/01/2020   CHOLECYSTECTOMY     EYE SURGERY     HEMORRHOID SURGERY     INTRAUTERINE DEVICE INSERTION  03/25/2009   SHOULDER SURGERY  06/23/2009   THYROIDECTOMY  04/10/2012   Procedure: THYROIDECTOMY;  Surgeon: Alm Bouche, MD;  Location[facility];  Service: ENT;  Laterality: N/A;  Total Thyroidectomy   TONSILLECTOMY AND ADENOIDECTOMY  CHILD   TUBAL LIGATION     Social History   Tobacco Use   Smoking status: Former    Current packs/day: 0.00    Average packs/day: 0.2 packs/day for 5.0 years (1.0 ttl pk-yrs)    Types: Cigarettes    Start date: 03/26/1999    Quit date: 03/25/2004    Years since quitting: 19.5   Smokeless tobacco: Never  Vaping Use   Vaping status: Never Used  Substance Use Topics   Alcohol use: No   Drug use: No   Family History  Problem Relation Age of Onset   Arthritis Mother        osteoarthritis   Asthma Mother    Hypertension Mother    Multiple sclerosis Mother    Anxiety disorder Mother    Depression Mother    Vision loss Mother    Cancer Father        colon cancer   Arthritis Father        osteoarthritis    Colon cancer Father    Colon polyps Father    Transient ischemic attack Father    COPD Father    Asthma Father    Stroke Father    Vision loss Father    Heart disease Maternal Grandfather    Ovarian cancer Maternal Aunt  Diabetes Paternal Aunt    Diabetes Paternal Uncle    Alcohol abuse Son    Drug abuse Son    Early death Son    Stomach cancer Neg Hx    Esophageal cancer Neg Hx    Allergies  Allergen Reactions   Tape Rash    Plastic tape   Buspirone Hcl Nausea Only   Glipizide     REACTION: sleepy, low sugar   Ivp Dye [Iodinated Contrast Media]     Shut kidneys down   Oxycodone-Acetaminophen  Itching   Current Outpatient Medications on File Prior to Visit  Medication Sig Dispense Refill   Albuterol  Sulfate (PROAIR  RESPICLICK) 108 (90 Base) MCG/ACT AEPB Inhale 2 puffs into the lungs every 6 (six) hours as needed. 1 each 2   buPROPion  (WELLBUTRIN  XL) 150 MG 24 hr tablet TAKE 1 TABLET BY MOUTH EVERY DAY 30 tablet 0   carvedilol  (COREG ) 6.25 MG tablet TAKE 1 TABLET 2 (TWO) TIMES DAILY WITH A MEAL. NEEDS FOLLOW UP APPOINTMENT FOR ANYMORE REFILLS 180 tablet 1   cetirizine  (ZYRTEC ) 10 MG tablet TAKE 1 TABLET BY MOUTH EVERY DAY 90 tablet 1   cholecalciferol (VITAMIN D3) 25 MCG (1000 UNIT) tablet Take 2,000 Units by mouth daily.     Continuous Blood Gluc Sensor (FREESTYLE LIBRE 3 SENSOR) MISC 1 each by Does not apply route every 14 (fourteen) days. 6 each 3   cyanocobalamin  (VITAMIN B12) 500 MCG tablet Take 500 mcg by mouth daily.     dorzolamide (TRUSOPT) 2 % ophthalmic solution Place 1 drop into both eyes 2 (two) times daily.     empagliflozin  (JARDIANCE ) 25 MG TABS tablet Take 1 tablet (25 mg total) by mouth daily before breakfast. 90 tablet 3   ENTRESTO  49-51 MG TAKE 1 TABLET BY MOUTH 2 (TWO) TIMES DAILY. NEEDS FOLLOW UP APPOINTMENT FOR MORE REFILLS 60 tablet 0   ferrous sulfate  325 (65 FE) MG tablet Take 325 mg by mouth 2 (two) times daily.       fluticasone  (FLONASE ) 50  MCG/ACT nasal spray Place 2 sprays into both nostrils daily as needed for allergies. 16 g 11   hydrOXYzine  (VISTARIL ) 25 MG capsule TAKE 1 CAPSULE BY MOUTH EVERY 8 HOURS AS NEEDED FOR ANXIETY OR ITCHING (INSOMNIA). 60 capsule 3   insulin  aspart (NOVOLOG  FLEXPEN) 100 UNIT/ML FlexPen INJECT 15-20 UNITS THREE TIMES A DAY BEFORE MEALS. 45 mL 0   insulin  glargine, 1 Unit Dial , (TOUJEO  SOLOSTAR) 300 UNIT/ML Solostar Pen Inject 90 Units into the skin daily. 18 mL 1   Insulin  Pen Needle 32G X 4 MM MISC Use 5x a day 400 each 3   levothyroxine  (SYNTHROID ) 175 MCG tablet TAKE 1 TABLET BY MOUTH EVERY MORNING ON AN EMPTY STOMACH 90 tablet 3   metFORMIN  (GLUCOPHAGE ) 1000 MG tablet TAKE 1 TABLET (1,000 MG TOTAL) BY MOUTH TWICE A DAY WITH FOOD 60 tablet 0   ondansetron  (ZOFRAN -ODT) 4 MG disintegrating tablet 4mg  ODT q4 hours prn nausea/vomit 20 tablet 0   pantoprazole  (PROTONIX ) 40 MG tablet Take 1 tablet (40 mg total) by mouth daily. 90 tablet 2   potassium chloride  (KLOR-CON ) 20 MEQ packet Take 20 mEq by mouth as needed.     rosuvastatin  (CRESTOR ) 20 MG tablet TAKE 1 TABLET BY MOUTH EVERY DAY 90 tablet 1   spironolactone  (ALDACTONE ) 25 MG tablet TAKE 1 TABLET (25 MG TOTAL) BY MOUTH DAILY. NEEDS FOLLOW UP APPOINTMENT FOR MORE REFILLS 9030 tablet 1   torsemide  (DEMADEX ) 20 MG tablet  Take 1 tablet (20 mg total) by mouth daily as needed. 30 tablet 6   venlafaxine  XR (EFFEXOR -XR) 75 MG 24 hr capsule TAKE 1 CAPSULE BY MOUTH DAILY WITH BREAKFAST. 90 capsule 3   zolpidem  (AMBIEN ) 10 MG tablet TAKE 1/2 TO 1 TABLET BY MOUTH AT BEDTIME AS NEEDED 30 tablet 3   No current facility-administered medications on file prior to visit.    Review of Systems  Constitutional:  Positive for chills and fatigue. Negative for activity change, appetite change, fever and unexpected weight change.  HENT:  Negative for congestion, ear pain, rhinorrhea, sinus pressure and sore throat.   Eyes:  Negative for pain, redness and visual  disturbance.  Respiratory:  Negative for cough, shortness of breath and wheezing.   Cardiovascular:  Negative for chest pain and palpitations.  Gastrointestinal:  Positive for diarrhea, nausea and vomiting. Negative for abdominal pain, blood in stool and constipation.       One episode of vomiting since Saturday (was this am)   Endocrine: Negative for polydipsia and polyuria.  Genitourinary:  Negative for dysuria, frequency and urgency.  Musculoskeletal:  Negative for arthralgias, back pain and myalgias.  Skin:  Negative for pallor and rash.  Allergic/Immunologic: Negative for environmental allergies.  Neurological:  Positive for dizziness and headaches. Negative for syncope.  Hematological:  Negative for adenopathy. Does not bruise/bleed easily.  Psychiatric/Behavioral:  Negative for decreased concentration and dysphoric mood. The patient is not nervous/anxious.        Objective:   Physical Exam Constitutional:      General: She is not in acute distress.    Appearance: Normal appearance. She is well-developed. She is obese. She is not toxic-appearing or diaphoretic.     Comments: Fatigued appearing   HENT:     Head: Normocephalic and atraumatic.     Mouth/Throat:     Mouth: Mucous membranes are moist.     Pharynx: No oropharyngeal exudate or posterior oropharyngeal erythema.  Eyes:     General: No scleral icterus.       Right eye: No discharge.        Left eye: No discharge.     Conjunctiva/sclera: Conjunctivae normal.     Pupils: Pupils are equal, round, and reactive to light.  Neck:     Thyroid : No thyromegaly.     Vascular: No carotid bruit or JVD.  Cardiovascular:     Rate and Rhythm: Normal rate and regular rhythm.     Heart sounds: Normal heart sounds.     No gallop.  Pulmonary:     Effort: Pulmonary effort is normal. No respiratory distress.     Breath sounds: Normal breath sounds. No stridor. No wheezing, rhonchi or rales.  Abdominal:     General: Bowel sounds are  normal. There is no distension or abdominal bruit.     Palpations: Abdomen is soft. There is no shifting dullness, fluid wave, hepatomegaly, splenomegaly, mass or pulsatile mass.     Tenderness: There is abdominal tenderness in the right lower quadrant and left lower quadrant. There is no right CVA tenderness, left CVA tenderness, guarding or rebound. Negative signs include Murphy's sign.  Musculoskeletal:     Cervical back: Normal range of motion and neck supple.     Right lower leg: No edema.     Left lower leg: No edema.  Lymphadenopathy:     Cervical: No cervical adenopathy.  Skin:    General: Skin is warm and dry.     Coloration: Skin  is not pale.     Findings: No rash.  Neurological:     Mental Status: She is alert.     Coordination: Coordination normal.     Deep Tendon Reflexes: Reflexes are normal and symmetric. Reflexes normal.  Psychiatric:        Mood and Affect: Mood normal.           Assessment & Plan:   Problem List Items Addressed This Visit       Digestive   Nausea vomiting and diarrhea - Primary   Since last week  Some headache and dizziness (improved)  Seen in ER -noted lyte abn / treated with anti nausea med and fluids  Reviewed hospital records, lab results and studies in detail   Taking zofran    Overall slow improvement -one vomiting spell since Saturday Crampy abd pain  Reassuring exam  Viral gastroenteritis is high in differential  Has had ccy in past  Is diabetic   Lab today  Stool studies today   Update if not starting to improve in a week or if worsening  Call back and Er precautions noted in detail today        Relevant Orders   C. difficile GDH and Toxin A/B   GI Profile, Stool, PCR   Basic metabolic panel with GFR   CBC with Differential/Platelet   Hepatic function panel     Endocrine   Poorly controlled type 2 diabetes mellitus with peripheral neuropathy (HCC)   Pt reports she is due for endocrinology follow up and says she  will call to schedule  Glucose was up in ER

## 2023-09-24 NOTE — Assessment & Plan Note (Signed)
 Pt reports she is due for endocrinology follow up and says she will call to schedule  Glucose was up in ER

## 2023-09-24 NOTE — Assessment & Plan Note (Addendum)
 Since last week  Some headache and dizziness (improved)  Seen in ER -noted lyte abn / treated with anti nausea med and fluids  Reviewed hospital records, lab results and studies in detail   Taking zofran    Overall slow improvement -one vomiting spell since Saturday Crampy abd pain  Reassuring exam  Viral gastroenteritis is high in differential  Has had ccy in past  Is diabetic   Lab today  Stool studies today   Update if not starting to improve in a week or if worsening  Call back and Er precautions noted in detail today

## 2023-09-24 NOTE — Patient Instructions (Signed)
 Continue the zofran  as needed  Keep hydrating  Primarily water/ some electolyte drinks are ok   Lab today  Stools tests ordered as well   If symptoms worsen/ let us  know  Go to the hospital if severe

## 2023-09-25 ENCOUNTER — Other Ambulatory Visit: Payer: Self-pay | Admitting: Family Medicine

## 2023-09-25 ENCOUNTER — Telehealth: Payer: Self-pay | Admitting: Internal Medicine

## 2023-09-25 ENCOUNTER — Ambulatory Visit: Payer: Self-pay | Admitting: Family Medicine

## 2023-09-25 LAB — BASIC METABOLIC PANEL WITH GFR
BUN: 9 mg/dL (ref 7–25)
CO2: 25 mmol/L (ref 20–32)
Calcium: 9.1 mg/dL (ref 8.6–10.4)
Chloride: 95 mmol/L — ABNORMAL LOW (ref 98–110)
Creat: 0.82 mg/dL (ref 0.50–1.03)
Glucose, Bld: 363 mg/dL — ABNORMAL HIGH (ref 65–99)
Potassium: 3.7 mmol/L (ref 3.5–5.3)
Sodium: 133 mmol/L — ABNORMAL LOW (ref 135–146)
eGFR: 83 mL/min/{1.73_m2} (ref >=60)

## 2023-09-25 LAB — CBC WITH DIFFERENTIAL/PLATELET
Absolute Lymphocytes: 2110 {cells}/uL (ref 850–3900)
Absolute Monocytes: 439 {cells}/uL (ref 200–950)
Basophils Absolute: 69 {cells}/uL (ref 0–200)
Basophils Relative: 0.9 %
Eosinophils Absolute: 177 {cells}/uL (ref 15–500)
Eosinophils Relative: 2.3 %
HCT: 47.1 % — ABNORMAL HIGH (ref 35.0–45.0)
Hemoglobin: 15 g/dL (ref 11.7–15.5)
MCH: 29.6 pg (ref 27.0–33.0)
MCHC: 31.8 g/dL — ABNORMAL LOW (ref 32.0–36.0)
MCV: 92.9 fL (ref 80.0–100.0)
MPV: 9.7 fL (ref 7.5–12.5)
Monocytes Relative: 5.7 %
Neutro Abs: 4905 {cells}/uL (ref 1500–7800)
Neutrophils Relative %: 63.7 %
Platelets: 228 10*3/uL (ref 140–400)
RBC: 5.07 10*6/uL (ref 3.80–5.10)
RDW: 13.5 % (ref 11.0–15.0)
Total Lymphocyte: 27.4 %
WBC: 7.7 10*3/uL (ref 3.8–10.8)

## 2023-09-25 LAB — HEPATIC FUNCTION PANEL
AG Ratio: 2 (calc) (ref 1.0–2.5)
ALT: 15 U/L (ref 6–29)
AST: 14 U/L (ref 10–35)
Albumin: 4.3 g/dL (ref 3.6–5.1)
Alkaline phosphatase (APISO): 69 U/L (ref 37–153)
Bilirubin, Direct: 0.1 mg/dL (ref 0.0–0.2)
Globulin: 2.1 g/dL (ref 1.9–3.7)
Indirect Bilirubin: 0.6 mg/dL (ref 0.2–1.2)
Total Bilirubin: 0.7 mg/dL (ref 0.2–1.2)
Total Protein: 6.4 g/dL (ref 6.1–8.1)

## 2023-09-25 LAB — C. DIFFICILE GDH AND TOXIN A/B
GDH ANTIGEN: NOT DETECTED
MICRO NUMBER:: 16652293
SPECIMEN QUALITY:: ADEQUATE
TOXIN A AND B: NOT DETECTED

## 2023-09-25 MED ORDER — LEVOTHYROXINE SODIUM 175 MCG PO TABS: ORAL_TABLET | ORAL | 0 refills | Status: DC

## 2023-09-25 MED ORDER — ZOLPIDEM TARTRATE 10 MG PO TABS: 5.0000 mg | ORAL_TABLET | Freq: Every evening | ORAL | 3 refills | Status: DC | PRN

## 2023-09-25 NOTE — Telephone Encounter (Signed)
 Requested Prescriptions   Signed Prescriptions Disp Refills   levothyroxine  (SYNTHROID ) 175 MCG tablet 10 tablet 0    Sig: TAKE 1 TABLET BY MOUTH EVERY MORNING ON AN EMPTY STOMACH    Authorizing Provider: TRIXIE FILE    Ordering User: CLEOTILDE REMAK S   Medication has been sent to last until her appt on 09/29/23 and for her to get her labs checked at her visit. (Did not send a full supply just incase her dose changed.

## 2023-09-25 NOTE — Telephone Encounter (Signed)
 MEDICATION: levothyroxine  (SYNTHROID ) 175 MCG tablet   PHARMACY:    [id number] [address] [place], [place] - [address] (Ph: [phone])    HAS THE PATIENT CONTACTED THEIR PHARMACY?  Yes  IS THIS A 90 DAY SUPPLY : Yes  IS PATIENT OUT OF MEDICATION: Yes  IF NOT; HOW MUCH IS LEFT:   LAST APPOINTMENT DATE: @4 /10/2022  NEXT APPOINTMENT DATE:@7 /09/2023  DO WE HAVE YOUR PERMISSION TO LEAVE A DETAILED MESSAGE?:Yes  OTHER COMMENTS:    **Let patient know to contact pharmacy at the end of the day to make sure medication is ready. **  ** Please notify patient to allow 48-72 hours to process**  **Encourage patient to contact the pharmacy for refills or they can request refills through [facility]**

## 2023-09-27 LAB — GASTROINTESTINAL PATHOGEN PNL
CampyloBacter Group: NOT DETECTED
Norovirus GI/GII: NOT DETECTED
Rotavirus A: NOT DETECTED
Salmonella species: NOT DETECTED
Shiga Toxin 1: NOT DETECTED
Shiga Toxin 2: NOT DETECTED
Shigella Species: NOT DETECTED
Vibrio Group: NOT DETECTED
Yersinia enterocolitica: NOT DETECTED

## 2023-09-29 ENCOUNTER — Ambulatory Visit (INDEPENDENT_AMBULATORY_CARE_PROVIDER_SITE_OTHER): Payer: Self-pay | Admitting: Internal Medicine

## 2023-09-29 ENCOUNTER — Encounter: Payer: Self-pay | Admitting: Internal Medicine

## 2023-09-29 VITALS — BP 130/88 | HR 67 | Resp 20 | Ht 65.0 in | Wt 230.4 lb

## 2023-09-29 DIAGNOSIS — Z7984 Long term (current) use of oral hypoglycemic drugs: Secondary | ICD-10-CM

## 2023-09-29 DIAGNOSIS — Z91199 Patient's noncompliance with other medical treatment and regimen due to unspecified reason: Secondary | ICD-10-CM

## 2023-09-29 DIAGNOSIS — E890 Postprocedural hypothyroidism: Secondary | ICD-10-CM

## 2023-09-29 DIAGNOSIS — C73 Malignant neoplasm of thyroid gland: Secondary | ICD-10-CM

## 2023-09-29 DIAGNOSIS — E1142 Type 2 diabetes mellitus with diabetic polyneuropathy: Secondary | ICD-10-CM

## 2023-09-29 DIAGNOSIS — E1165 Type 2 diabetes mellitus with hyperglycemia: Secondary | ICD-10-CM

## 2023-09-29 LAB — POCT GLYCOSYLATED HEMOGLOBIN (HGB A1C): HbA1c POC (<> result, manual entry): >15 % (ref 4.0–5.6)

## 2023-09-29 MED ORDER — INSULIN REGULAR HUMAN 100 UNIT/ML IJ SOLN: INTRAMUSCULAR | 11 refills | Status: DC

## 2023-09-29 MED ORDER — INSULIN NPH (HUMAN) (ISOPHANE) 100 UNIT/ML ~~LOC~~ SUSP: SUBCUTANEOUS | Status: DC

## 2023-09-29 MED ORDER — LEVOTHYROXINE SODIUM 175 MCG PO TABS: ORAL_TABLET | ORAL | 5 refills | Status: AC

## 2023-09-29 NOTE — Addendum Note (Signed)
 Addended by: ARELIA DIETRICH SAILOR on: 09/29/2023 10:54 AM   Modules accepted: Orders

## 2023-09-29 NOTE — Patient Instructions (Addendum)
 Please continue: - Metformin  1000 mg 2x a day  Please start: - NPH 30 units in am and 20 units at night - R 15-20 units 30 min before a meal 2x a day  10 units before a snack   Please also continue: - Levothyroxine  175 mcg daily.   Take the thyroid  hormone every day with water, at least 30 minutes before breakfast, separated by at least 4 hours from: - acid reflux medications - calcium  - iron - multivitamins   Please establish care with another endocrinologist.

## 2023-09-29 NOTE — Progress Notes (Signed)
 Patient ID: [name], female   DOB: 1965-09-09, 58 y.o.   MRN: [id number]  HPI: [name] is a 58 y.o.-year-old female, returning for f/u for DM2, dx 2008, insulin -dependent since ~2011, uncontrolled, with complications (peripheral neuropathy, dCHF), h/o papillary thyroid  cancer and iatrogenic hypothyroidism.  At today's visit, she returns after another long absence of more than a year.  Previous visit was also more than a year prior.  Interim history: No increased urination, blurry vision, chest pain.  She was recently in the emergency room 09/20/2023 with nausea vomiting and diarrhea. She was laid off in 04/2023. She is not able to get disability. She is trying to re-apply. Able to get her insulin  since she was laid off and she did not let me know about this.  She also came off the sensor and is not checking blood sugars.  She was off levothyroxine  for the last 5 months!  DM2: Reviewed HbA1c levels: Lab Results  Component Value Date   HGBA1C 13.0 (A) 07/01/2022   HGBA1C 9.1 (A) 06/11/2021   HGBA1C 10.8 (A) 12/09/2019    At last visit she was on: - Toujeo  45 x 2 injections at night >> off since 02/2021 >> restarted 03/2021 >> Off now - Metformin  1000 mg 2x a day  >> off since 02/2021  >> restarted 03/2021 - Invokana  300 mg in a.m. >> Jardiance  25 mg before b'fast - Humalog   >> stopped 2/2 nausea, diarrhea (?) >>  Restarted 03/2021: 15-20 units 3x a day 15 min before meals You may need 5-10 units of Humalog  around 8 am even if you do not drink coffee or eat b'fast. She was also on Glipizide but developed hypoglycemia >> loss of consciousness. She was also on Amaryl  >> taken off.  She was on Ozempic - started 08/2017 >> restarted >> stopped 2/2 nausea in 2020  I recommended the following: - Metformin  1000 mg 2x a day - Jardiance  25 mg before b'fast >> off - Toujeo  45 x 2 injections at night >> off - Humalog  15-20 units 15 min before a meal but 10 units before a snack >>  off  She is not checking blood sugars.  Previously:  Previously:   Previously:   -No CKD, last BUN/creatinine:  Lab Results  Component Value Date   BUN 9 09/24/2023   CREATININE 0.82 09/24/2023   Lab Results  Component Value Date   MICRALBCREAT 12.8 04/20/2008  On Entresto  - but $$$.  -+ HL; last set of lipids: Lab Results  Component Value Date   CHOL 139 01/23/2023   HDL 42.60 01/23/2023   LDLCALC 52 01/23/2023   LDLDIRECT 97.0 01/15/2022   TRIG 225.0 (H) 01/23/2023   CHOLHDL 3 01/23/2023  On Crestor  20.  - last eye exam was in 2024: + DR reportedly ([facility], but also sent to [name] - had 2 IO inj), + glaucoma. [facility].Had intraocular lens transplant B in 2022.  - she has Numbness and tingling in her feet.  Latest foot exam in 06/2022.  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  Not have to have RAI treatment  Neck U/S (11/24/2014): Post thyroidectomy.  No suspicious lymphadenopathy or soft tissue.  Neck U/S (10/07/2017): Post total thyroidectomy without evidence residual, locally recurrent or metastatic disease.  Pt restarted levothyroxine  175 mcg daily, taken: -  is off LT4 since 04/2023!!!!!! - in a pillbox - in am - fasting - at least 30 min from b'fast - no calcium  - + iron at lunchtime and at night - no multivitamins - + PPIs at lunchtime - off Biotin - not on Questran   She is not compliant with doses.  Her TFTs are fluctuating. Lab Results  Component Value Date   TSH 1.60 01/23/2023   TSH 4.48 01/15/2022   TSH 0.81 06/11/2021   TSH 4.49 11/24/2020   TSH 23.95 (H) 09/29/2019   TSH 20.95 (H) 08/14/2018   TSH 2.011 12/15/2017   TSH 6.32 (H) 08/01/2017   TSH 2.83 05/23/2017   TSH 14.73 (H) 07/26/2016   Pt denies: - feeling nodules in  neck - hoarseness - choking But has chronic dysphagia.  ROS: + see HPI  I reviewed pt's medications, allergies, PMH, social hx, family hx, and changes were documented in the history of present illness. Otherwise, unchanged from my initial visit note.  Past Medical History:  Diagnosis Date   Allergy    SEASONAL   Anemia    iron deficiency   Anxiety    Arthritis    Asthma    a. 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   Cataract 02/23/2019   [name] @ [facility]. Both eyes, small.   Cellulitis and abscess of leg 02/2014   CHF (congestive heart failure) (HCC)    Chronic kidney disease    /13   Complication of anesthesia    DIFFICULT WAKING- 2011   Depression    a. Stress reaction 08/2011 in multiple social stressors.   Diabetes mellitus    Dysrhythmia    Gastritis    GERD (gastroesophageal reflux disease)    Heart murmur    Hemorrhoid    Hyperlipidemia    Hypertension    Hyperthyroidism    Insomnia    Morbid obesity (HCC)    Neuromuscular disorder (HCC) 12/16/2018   [name], mild L5 /S1 radiculopathy, mild DJD   OSA (obstructive sleep apnea) 01/17/2012   Papillary thyroid  carcinoma (HCC)    PCOS (polycystic ovarian syndrome)    Sleep apnea    C PAP   Thyroid  cancer (HCC) 2014   Vertigo    Past Surgical History:  Procedure Laterality Date   CATARACT EXTRACTION Left 04/13/2020   CATARACT EXTRACTION Right 06/01/2020   CHOLECYSTECTOMY     EYE SURGERY     HEMORRHOID SURGERY     INTRAUTERINE DEVICE INSERTION  03/25/2009   SHOULDER SURGERY  06/23/2009   THYROIDECTOMY  04/10/2012   Procedure: THYROIDECTOMY;  Surgeon: Alm Bouche, MD;  Location: [facility];  Service: ENT;  Laterality: N/A;  Total Thyroidectomy   TONSILLECTOMY AND ADENOIDECTOMY  CHILD   TUBAL LIGATION     Social History   Socioeconomic History   Marital status: Married    Spouse name: Sunday Klos   Number of children: 2   Years of  education: Some college   Highest education level: 12th grade  Occupational History   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  Tobacco Use   Smoking status: Former    Current packs/day: 0.00    Average packs/day: 0.2 packs/day for 5.0 years (1.0 ttl pk-yrs)    Types: Cigarettes    Start date: 03/26/1999    Quit date: 03/25/2004    Years since quitting: 19.5   Smokeless tobacco: Never  Vaping Use   Vaping status: Never Used  Substance and Sexual Activity   Alcohol use: No   Drug use: No   Sexual activity: Not Currently    Birth control/protection: Post-menopausal    Comment: I have no desire for Sexual activity.  Other Topics Concern   Not on file  Social History Narrative   Lives with son/husband   Caffeine use: daily   Right handed    Social Drivers of Health   Financial Resource Strain: Low Risk  (01/30/2023)   Overall Financial Resource Strain (CARDIA)    Difficulty of Paying Living Expenses: Not hard at all  Food Insecurity: No Food Insecurity (01/30/2023)   Hunger Vital Sign    Worried About Running Out of Food in the Last Year: Never true    Ran Out of Food in the Last Year: Never true  Transportation Needs: No Transportation Needs (01/30/2023)   PRAPARE - Administrator, Civil Service (Medical): No    Lack of Transportation (Non-Medical): No  Physical Activity: Unknown (01/30/2023)   Exercise Vital Sign    Days of Exercise per Week: 0 days    Minutes of Exercise per Session: Not on file  Stress: Stress Concern Present (01/30/2023)   [employer] of Occupational Health - Occupational Stress Questionnaire    Feeling of Stress : Very much  Social Connections: Moderately Integrated (01/30/2023)   Social Connection and Isolation Panel    Frequency of Communication with Friends and Family: Three times a week    Frequency of Social Gatherings with Friends and Family: More than three times a week    Attends Religious Services: More than 4 times per year     Active Member of [employer] or Organizations: No    Attends Engineer, structural: Not on file    Marital Status: Married  Catering manager Violence: Not on file   Current Outpatient Medications on File Prior to Visit  Medication Sig Dispense Refill   Albuterol  Sulfate (PROAIR  RESPICLICK) 108 (90 Base) MCG/ACT AEPB Inhale 2 puffs into the lungs every 6 (six) hours as needed. 1 each 2   buPROPion  (WELLBUTRIN  XL) 150 MG 24 hr tablet TAKE 1 TABLET BY MOUTH EVERY DAY 30 tablet 0   carvedilol  (COREG ) 6.25 MG tablet TAKE 1 TABLET 2 (TWO) TIMES DAILY WITH A MEAL. NEEDS FOLLOW UP APPOINTMENT FOR ANYMORE REFILLS 180 tablet 1   cetirizine  (ZYRTEC ) 10 MG tablet TAKE 1 TABLET BY MOUTH EVERY DAY 90 tablet 1   cholecalciferol (VITAMIN D3) 25 MCG (1000 UNIT) tablet Take 2,000 Units by mouth daily.     Continuous Blood Gluc Sensor (FREESTYLE LIBRE 3 SENSOR) MISC 1 each by Does not apply route every 14 (fourteen) days. 6 each 3   cyanocobalamin  (VITAMIN B12) 500 MCG tablet Take 500 mcg by mouth daily.     dorzolamide (TRUSOPT) 2 % ophthalmic solution Place 1 drop into both eyes 2 (two) times daily.     empagliflozin  (JARDIANCE ) 25 MG TABS tablet Take 1 tablet (25 mg total) by mouth daily before breakfast. 90 tablet 3   ENTRESTO  49-51 MG TAKE 1 TABLET BY MOUTH 2 (TWO) TIMES DAILY. NEEDS FOLLOW UP APPOINTMENT FOR MORE REFILLS 60 tablet 0   ferrous sulfate  325 (65 FE) MG tablet Take 325 mg by mouth 2 (two) times daily.       fluticasone  (FLONASE ) 50 MCG/ACT nasal spray Place 2 sprays into both nostrils daily as needed for allergies. 16 g 11   hydrOXYzine  (VISTARIL ) 25 MG capsule TAKE 1 CAPSULE BY  MOUTH EVERY 8 HOURS AS NEEDED FOR ANXIETY OR ITCHING (INSOMNIA). 60 capsule 3   insulin  aspart (NOVOLOG  FLEXPEN) 100 UNIT/ML FlexPen INJECT 15-20 UNITS THREE TIMES A DAY BEFORE MEALS. 45 mL 0   insulin  glargine, 1 Unit Dial , (TOUJEO  SOLOSTAR) 300 UNIT/ML Solostar Pen Inject 90 Units into the skin daily. 18 mL 1    Insulin  Pen Needle 32G X 4 MM MISC Use 5x a day 400 each 3   levothyroxine  (SYNTHROID ) 175 MCG tablet TAKE 1 TABLET BY MOUTH EVERY MORNING ON AN EMPTY STOMACH 10 tablet 0   metFORMIN  (GLUCOPHAGE ) 1000 MG tablet TAKE 1 TABLET (1,000 MG TOTAL) BY MOUTH TWICE A DAY WITH FOOD 60 tablet 0   ondansetron  (ZOFRAN -ODT) 4 MG disintegrating tablet 4mg  ODT q4 hours prn nausea/vomit 20 tablet 0   pantoprazole  (PROTONIX ) 40 MG tablet Take 1 tablet (40 mg total) by mouth daily. 90 tablet 2   potassium chloride  (KLOR-CON ) 20 MEQ packet Take 20 mEq by mouth as needed.     rosuvastatin  (CRESTOR ) 20 MG tablet TAKE 1 TABLET BY MOUTH EVERY DAY 90 tablet 1   spironolactone  (ALDACTONE ) 25 MG tablet TAKE 1 TABLET (25 MG TOTAL) BY MOUTH DAILY. NEEDS FOLLOW UP APPOINTMENT FOR MORE REFILLS 9030 tablet 1   torsemide  (DEMADEX ) 20 MG tablet Take 1 tablet (20 mg total) by mouth daily as needed. 30 tablet 6   venlafaxine  XR (EFFEXOR -XR) 75 MG 24 hr capsule TAKE 1 CAPSULE BY MOUTH DAILY WITH BREAKFAST. 90 capsule 3   zolpidem  (AMBIEN ) 10 MG tablet Take 0.5-1 tablets (5-10 mg total) by mouth at bedtime as needed. 30 tablet 3   No current facility-administered medications on file prior to visit.   Allergies  Allergen Reactions   Tape Rash    Plastic tape   Buspirone Hcl Nausea Only   Glipizide     REACTION: sleepy, low sugar   Ivp Dye [Iodinated Contrast Media]     Shut kidneys down   Oxycodone-Acetaminophen  Itching   Family History  Problem Relation Age of Onset   Arthritis Mother        osteoarthritis   Asthma Mother    Hypertension Mother    Multiple sclerosis Mother    Anxiety disorder Mother    Depression Mother    Vision loss Mother    Cancer Father        colon cancer   Arthritis Father        osteoarthritis   Colon cancer Father    Colon polyps Father    Transient ischemic attack Father    COPD Father    Asthma Father    Stroke Father    Vision loss Father    Heart disease Maternal Grandfather     Ovarian cancer Maternal Aunt    Diabetes Paternal Aunt    Diabetes Paternal Uncle    Alcohol abuse Son    Drug abuse Son    Early death Son    Stomach cancer Neg Hx    Esophageal cancer Neg Hx    PE: BP 130/88   Pulse 67   Resp 20   Ht 5' 5 (1.651 m)   Wt 230 lb 6.4 oz (104.5 kg)   LMP 09/23/2015 (Approximate)   SpO2 97%   BMI 38.34 kg/m   Wt Readings from Last 3 Encounters:  09/29/23 230 lb 6.4 oz (104.5 kg)  09/24/23 219 lb 2 oz (99.4 kg)  09/20/23 217 lb (98.4 kg)   Constitutional: overweight, in NAD Eyes:  EOMI, no exophthalmos ENT: no neck masses, no cervical lymphadenopathy Cardiovascular: RRR, No MRG Respiratory: CTA B Musculoskeletal: no deformities Skin:no rashes Neurological: no tremor with outstretched hands Diabetic Foot Exam - Simple   Simple Foot Form Diabetic Foot exam was performed with the following findings: Yes 09/29/2023 10:18 AM  Visual Inspection No deformities, no ulcerations, no other skin breakdown bilaterally: Yes Sensation Testing Intact to touch and monofilament testing bilaterally: Yes Pulse Check Posterior Tibialis and Dorsalis pulse intact bilaterally: Yes Comments    ASSESSMENT: 1. DM2, insulin -dependent, uncontrolled, without long-term complications but with hyperglycemia  2. Iatrogenic Hypothyroidism - h/o med noncompliance  3. FPTC -Subcentimeter -No RAI treatment was necessary  4.  Noncompliance with the treatment plan.  PLAN:  1. And 4.  Patient with history of uncontrolled type 2 diabetes, basal-bolus insulin  regimen along with metformin  and Jardiance , returns after another long absence of 1 year and 3 months.  She is not usually compliant with the recommended regimen, usually returning after more than a year from the previous visit.  At last visit she was off her insulin  and sugars were very high.  We did discuss at previous visits about the fact that if she did not come for appointments and not taking the recommended  regimen, I would not be able to help her.  At at last visit, she returned after losing her son in 02/2022.  Understandably, she was grieving and very affected by this so we discussed that I will give her another chance to return to clinic as recommended.  However, she now returns after more than a year again so we discussed that this would be our last visit. - At last visit, sugars were very high, barely visible on the GMI window.  I advised her to restart her insulin .  She was lost for follow-up afterwards and at today's visit she tells me that she was off the Jardiance  and the insulins for the last few months.  She did not let me know about this.  Moreover, she came off the sensor and is not checking blood sugars.  She continues only on metformin .  We discussed that we can use NPH and regular insulin  from [facility] and I advised her to start this right away.  These are over-the-counter.  When she has disability, she can switch to an analog insulins again. - I suggested to:  Patient Instructions  Please continue: - Metformin  1000 mg 2x a day  Please start: - NPH 30 units in am and 20 units at night - R 15-20 units 30 min before a meal 2x a day  10 units before a snack   Please also continue: - Levothyroxine  175 mcg daily.   Take the thyroid  hormone every day with water, at least 30 minutes before breakfast, separated by at least 4 hours from: - acid reflux medications - calcium  - iron - multivitamins   Please establish care with another endocrinologist.  - we checked her HbA1c: >15%  - advised to check sugars at different times of the day - 4x a day, rotating check times - advised for yearly eye exams >> she is UTD - she is due for urine microalbumin to creatinine ratio-will check this in 1.5 months when she returns for thyroid  test. - she will need to find another endocrinologist  2. Iatrogenic Hypothyroidism -Uncontrolled due to history of incomplete compliance with her levothyroxine   in the past.  At last visit she was off the medication completely for 1.5 months!  We discussed that this is unacceptable and advised her about the risks of hypothyroidism. - latest thyroid  labs reviewed with pt. >> normal: Lab Results  Component Value Date   TSH 1.60 01/23/2023  - she was on LT4 175 mcg daily, but apparently she was off for the last 5 months!  We discussed that she absolutely cannot miss this medication!  I called in another prescription for her and will have her back for labs in 1.5 months. - we discussed about taking the thyroid  hormone every day, with water, >30 minutes before breakfast, separated by >4 hours from acid reflux medications, calcium , iron, multivitamins. Pt. is taking it correctly.  3. Follicular PTC -Reviewed her neck ultrasound reports from 11/2014 and 09/2017: No recurrence - She has chronic dysphagia but no neck compression symptoms or nodules felt on palpation of her neck today  Orders Placed This Encounter  Procedures   TSH   T4, free   Microalbumin / creatinine urine ratio   Lela Fendt, MD PhD [facility]

## 2023-11-10 ENCOUNTER — Other Ambulatory Visit: Payer: Self-pay

## 2023-11-21 ENCOUNTER — Other Ambulatory Visit: Payer: Self-pay | Admitting: Family Medicine

## 2023-12-18 ENCOUNTER — Ambulatory Visit (INDEPENDENT_AMBULATORY_CARE_PROVIDER_SITE_OTHER)

## 2023-12-18 ENCOUNTER — Ambulatory Visit (HOSPITAL_COMMUNITY)
Admission: EM | Admit: 2023-12-18 | Discharge: 2023-12-18 | Disposition: A | Discharge status: Home / Self Care | Attending: Family Medicine | Admitting: Family Medicine

## 2023-12-18 ENCOUNTER — Other Ambulatory Visit: Payer: Self-pay

## 2023-12-18 ENCOUNTER — Encounter (HOSPITAL_COMMUNITY): Payer: Self-pay | Admitting: Emergency Medicine

## 2023-12-18 DIAGNOSIS — S81802A Unspecified open wound, left lower leg, initial encounter: Secondary | ICD-10-CM

## 2023-12-18 DIAGNOSIS — I10 Essential (primary) hypertension: Secondary | ICD-10-CM

## 2023-12-18 DIAGNOSIS — M7732 Calcaneal spur, left foot: Secondary | ICD-10-CM | POA: Diagnosis not present

## 2023-12-18 DIAGNOSIS — M7989 Other specified soft tissue disorders: Secondary | ICD-10-CM | POA: Diagnosis not present

## 2023-12-18 DIAGNOSIS — M79662 Pain in left lower leg: Secondary | ICD-10-CM

## 2023-12-18 DIAGNOSIS — M1712 Unilateral primary osteoarthritis, left knee: Secondary | ICD-10-CM | POA: Diagnosis not present

## 2023-12-18 MED ORDER — LIDOCAINE HCL (PF) 1 % IJ SOLN
INTRAMUSCULAR | Status: AC
  Filled 2023-12-18: qty 2

## 2023-12-18 MED ORDER — CEFTRIAXONE SODIUM 1 G IJ SOLR
INTRAMUSCULAR | Status: AC
  Filled 2023-12-18: qty 10

## 2023-12-18 MED ORDER — CEFTRIAXONE SODIUM 1 G IJ SOLR
1.0000 g | Freq: Once | INTRAMUSCULAR | Status: AC
  Administered 2023-12-18: 1 g via INTRAMUSCULAR

## 2023-12-18 MED ORDER — SULFAMETHOXAZOLE-TRIMETHOPRIM 800-160 MG PO TABS: 1.0000 | ORAL_TABLET | Freq: Two times a day (BID) | ORAL | 0 refills | Status: AC

## 2023-12-18 NOTE — ED Provider Notes (Signed)
 [facility]   [id number] 12/18/23 Arrival Time: 1439  ASSESSMENT & PLAN:  1. Pain in left lower leg   2. Elevated blood pressure reading with diagnosis of hypertension   3. Open wound of lower leg, left, initial encounter    No signs of abscess formation. I have personally viewed and independently interpreted the imaging studies ordered this visit. R tib/fib: bones appear normal; no radiopaque FB appreciated.  Meds ordered this encounter  Medications   cefTRIAXone  (ROCEPHIN ) injection 1 g   sulfamethoxazole -trimethoprim  (BACTRIM  DS) 800-160 MG tablet    Sig: Take 1 tablet by mouth 2 (two) times daily for 10 days.    Dispense:  20 tablet    Refill:  0     Discharge Instructions      Your blood pressure was noted to be elevated during your visit today. If you are currently taking medication for high blood pressure, please ensure you are taking this as directed. If you do not have a history of high blood pressure and your blood pressure remains persistently elevated, you may need to begin taking a medication at some point. You may return here within the next few days to recheck if unable to see your primary care provider or if you do not have a one.  BP (!) 186/96 (BP Location: Right Arm)   Pulse 71   Temp 98.6 F (37 C) (Oral)   Resp 20   LMP 09/23/2015 (Approximate)   SpO2 97%   BP Readings from Last 3 Encounters:  12/18/23 (!) 186/96  09/29/23 130/88  09/24/23 130/70        Follow-up Information     Schedule an appointment as soon as possible for a visit  with Tower, Laine LABOR, MD.   Specialties: Family Medicine, Radiology Why: To recheck your blood pressure and to f/u regarding the wound on your leg. Contact information: [address] [place] [address] [phone]         [facility] Emergency Department at [facility].   Specialty: Emergency Medicine Why: If symptoms worsen in any way. Contact information: [address] [place]  [phone]                Reviewed expectations re: course of current medical issues. Questions answered. Outlined signs and symptoms indicating need for more acute intervention. Understanding verbalized. After Visit Summary given.   SUBJECTIVE: History from: Patient. [name] is a 58 y.o. female. With DM. 12/10/2023 patient reports injury to both legs due to a dog.  A dog was on the chain, pulling, patient was pinned between a hard surface and the chain pulling against legs.    Has been using Neosporin. Wound is swollen. Last Tdap was 2013 per medical records Denies: fever. Normal PO intake without n/v/d. Denies extremity sensation changes or weakness.   OBJECTIVE:  Vitals:   12/18/23 1528  BP: (!) 186/96  Pulse: 71  Resp: 20  Temp: 98.6 F (37 C)  TempSrc: Oral  SpO2: 97%    General appearance: alert; no distress Extremities: mild edema of LLE; approx 8 x 1.5 cm open wound with some granulation tissue over anterior LLE; without drainage; without areas of fluctuance; is TTP Skin: warm and dry Neurologic: normal gait Psychological: alert and cooperative; normal mood and affect  Labs:  Labs Reviewed - No data to display  Imaging: DG Tibia/Fibula Left Result Date: 12/18/2023 CLINICAL DATA:  injury; swelling; anterior wound. EXAM: LEFT  TIBIA AND FIBULA - 2 VIEW COMPARISON:  None Available. FINDINGS: No acute fracture or dislocation. No aggressive osseous lesion. Mild degenerative changes of imaged joints. Ankle mortise appears intact. Calcaneal spur noted along the Achilles tendon and Plantar aponeurosis attachment sites. No focal soft tissue swelling. No focal soft tissue defect or air within the soft tissue. No radiopaque foreign bodies. IMPRESSION: No acute osseous abnormality of the left tibia and fibula. Electronically Signed   By: Ree Molt M.D.   On: 12/18/2023 17:20    Allergies  Allergen Reactions    Tape Rash    Plastic tape   Buspirone Hcl Nausea Only   Glipizide     REACTION: sleepy, low sugar   Ivp Dye [Iodinated Contrast Media]     Shut kidneys down   Oxycodone-Acetaminophen  Itching    Past Medical History:  Diagnosis Date   Allergy    SEASONAL   Anemia    iron deficiency   Anxiety    Arthritis    Asthma    a. 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   Cataract 02/23/2019   Dr. McCuen @ [facility] Both eyes, small.   Cellulitis and abscess of leg 02/2014   CHF (congestive heart failure) (HCC)    Chronic kidney disease    /13   Complication of anesthesia    DIFFICULT WAKING- 2011   Depression    a. Stress reaction 08/2011 in multiple social stressors.   Diabetes mellitus    Dysrhythmia    Gastritis    GERD (gastroesophageal reflux disease)    Heart murmur    Hemorrhoid    Hyperlipidemia    Hypertension    Hyperthyroidism    Insomnia    Morbid obesity (HCC)    Neuromuscular disorder (HCC) 12/16/2018   Dr. Vear Rattan Neuro, mild L5 /S1 radiculopathy, mild DJD   OSA (obstructive sleep apnea) 01/17/2012   Papillary thyroid  carcinoma (HCC)    PCOS (polycystic ovarian syndrome)    Sleep apnea    C PAP   Thyroid  cancer (HCC) 2014   Vertigo    Social History   Socioeconomic History   Marital status: Married    Spouse name: [name]   Number of children: 2   Years of education: Some college   Highest education level: 12th grade  Occupational History   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  Tobacco Use   Smoking status: Former    Current packs/day: 0.00    Average packs/day: 0.2 packs/day for 5.0 years (1.0 ttl pk-yrs)    Types: Cigarettes    Start date: 03/26/1999    Quit date: 03/25/2004    Years since quitting: 19.7   Smokeless tobacco: Never  Vaping Use   Vaping status: Never Used  Substance and Sexual Activity   Alcohol use: No   Drug use: No   Sexual activity: Not Currently    Birth  control/protection: Post-menopausal    Comment: I have no desire for Sexual activity.  Other Topics Concern   Not on file  Social History Narrative   Lives with son/husband   Caffeine use: daily   Right handed    Social Drivers of Health   Financial Resource Strain: Low Risk  (01/30/2023)   Overall Financial Resource Strain (CARDIA)    Difficulty of Paying Living Expenses: Not hard at all  Food Insecurity: No Food Insecurity (01/30/2023)   Hunger Vital Sign    Worried About Programme researcher, broadcasting/film/video  in the Last Year: Never true    Ran Out of Food in the Last Year: Never true  Transportation Needs: No Transportation Needs (01/30/2023)   PRAPARE - Administrator, Civil Service (Medical): No    Lack of Transportation (Non-Medical): No  Physical Activity: Unknown (01/30/2023)   Exercise Vital Sign    Days of Exercise per Week: 0 days    Minutes of Exercise per Session: Not on file  Stress: Stress Concern Present (01/30/2023)   [employer] of Occupational Health - Occupational Stress Questionnaire    Feeling of Stress : Very much  Social Connections: Moderately Integrated (01/30/2023)   Social Connection and Isolation Panel    Frequency of Communication with Friends and Family: Three times a week    Frequency of Social Gatherings with Friends and Family: More than three times a week    Attends Religious Services: More than 4 times per year    Active Member of Clubs or Organizations: No    Attends Engineer, structural: Not on file    Marital Status: Married  Catering manager Violence: Not on file   Family History  Problem Relation Age of Onset   Arthritis Mother        osteoarthritis   Asthma Mother    Hypertension Mother    Multiple sclerosis Mother    Anxiety disorder Mother    Depression Mother    Vision loss Mother    Cancer Father        colon cancer   Arthritis Father        osteoarthritis   Colon cancer Father    Colon polyps Father    Transient  ischemic attack Father    COPD Father    Asthma Father    Stroke Father    Vision loss Father    Heart disease Maternal Grandfather    Ovarian cancer Maternal Aunt    Diabetes Paternal Aunt    Diabetes Paternal Uncle    Alcohol abuse Son    Drug abuse Son    Early death Son    Stomach cancer Neg Hx    Esophageal cancer Neg Hx    Past Surgical History:  Procedure Laterality Date   CATARACT EXTRACTION Left 04/13/2020   CATARACT EXTRACTION Right 06/01/2020   CHOLECYSTECTOMY     EYE SURGERY     HEMORRHOID SURGERY     INTRAUTERINE DEVICE INSERTION  03/25/2009   SHOULDER SURGERY  06/23/2009   THYROIDECTOMY  04/10/2012   Procedure: THYROIDECTOMY;  Surgeon: Alm Bouche, MD;  Location: [facility] OR;  Service: ENT;  Laterality: N/A;  Total Thyroidectomy   TONSILLECTOMY AND ADENOIDECTOMY  CHILD   TUBAL LIGATION       Rolinda Rogue, MD 12/18/23 1753

## 2023-12-18 NOTE — Discharge Instructions (Signed)
 Your blood pressure was noted to be elevated during your visit today. If you are currently taking medication for high blood pressure, please ensure you are taking this as directed. If you do not have a history of high blood pressure and your blood pressure remains persistently elevated, you may need to begin taking a medication at some point. You may return here within the next few days to recheck if unable to see your primary care provider or if you do not have a one.  BP (!) 186/96 (BP Location: Right Arm)   Pulse 71   Temp 98.6 F (37 C) (Oral)   Resp 20   LMP 09/23/2015 (Approximate)   SpO2 97%   BP Readings from Last 3 Encounters:  12/18/23 (!) 186/96  09/29/23 130/88  09/24/23 130/70

## 2023-12-18 NOTE — ED Triage Notes (Addendum)
 12/10/2023 patient reports injury to both legs due to a dog.  A dog was on the chain, pulling, patient was pinned between a hard surface and the chain pulling against legs.    Small area to right lower leg, anterior.  Vertical wound to left , anterior lower leg .  Has been using peroxide and neosporin to wounds.  Left lower leg wound has white color to wound, red around wound and lower leg feels painful swollen   Last tdp was 2013 per medical records

## 2023-12-21 ENCOUNTER — Other Ambulatory Visit: Payer: Self-pay | Admitting: Family Medicine

## 2023-12-22 ENCOUNTER — Ambulatory Visit (INDEPENDENT_AMBULATORY_CARE_PROVIDER_SITE_OTHER): Admitting: Family Medicine

## 2023-12-22 ENCOUNTER — Encounter: Payer: Self-pay | Admitting: Family Medicine

## 2023-12-22 VITALS — BP 125/70 | HR 72 | Temp 98.4°F | Ht 65.0 in | Wt 231.5 lb

## 2023-12-22 DIAGNOSIS — I10 Essential (primary) hypertension: Secondary | ICD-10-CM

## 2023-12-22 DIAGNOSIS — S81802S Unspecified open wound, left lower leg, sequela: Secondary | ICD-10-CM | POA: Diagnosis not present

## 2023-12-22 DIAGNOSIS — Z23 Encounter for immunization: Secondary | ICD-10-CM

## 2023-12-22 DIAGNOSIS — S81802A Unspecified open wound, left lower leg, initial encounter: Secondary | ICD-10-CM | POA: Insufficient documentation

## 2023-12-22 MED ORDER — CEPHALEXIN 500 MG PO CAPS
500.0000 mg | ORAL_CAPSULE | Freq: Two times a day (BID) | ORAL | 0 refills | Status: DC
Start: 2023-12-22 — End: 2024-01-07

## 2023-12-22 NOTE — Patient Instructions (Addendum)
 For wounds Always use soap and water to clean No more peroxide - it delays healing   You can use antibiotic ointment (neo sporin) or aquaphor to cover it after cleaning  Keep covered if needed to prevent dirt if you are outdoors/however   Tetanus shot today   Finish the generic bactrim   Start and finish generic keflex     If any worse pain/swelling/redness or any fever- call asap   Follow up with me in about a week for a re check

## 2023-12-22 NOTE — Progress Notes (Signed)
 Subjective:    Patient ID: [name], female    DOB: 01/12/1966, 58 y.o.   MRN: [id number]  HPI  Wt Readings from Last 3 Encounters:  12/22/23 231 lb 8 oz (105 kg)  09/29/23 230 lb 6.4 oz (104.5 kg)  09/24/23 219 lb 2 oz (99.4 kg)   38.52 kg/m  Vitals:   12/22/23 1156 12/22/23 1217  BP: 134/84 125/70  Pulse: 72   Temp: 98.4 F (36.9 C)   SpO2: 99%     Pt presents for follow up of  UC visit on 12/18/23 for  Pain in left lower leg with wound  Elevated blood pressure    Wound LLL Her large dog pulled her over with his lead-  Large skin tear/ laceration on LLL  Little one on the other leg   Looking better since her first dose of antibiotic  Drained in beginning but not now     Xray normal DG Tibia/Fibula Left Result Date: 12/18/2023 CLINICAL DATA:  injury; swelling; anterior wound. EXAM: LEFT TIBIA AND FIBULA - 2 VIEW COMPARISON:  None Available. FINDINGS: No acute fracture or dislocation. No aggressive osseous lesion. Mild degenerative changes of imaged joints. Ankle mortise appears intact. Calcaneal spur noted along the Achilles tendon and Plantar aponeurosis attachment sites. No focal soft tissue swelling. No focal soft tissue defect or air within the soft tissue. No radiopaque foreign bodies. IMPRESSION: No acute osseous abnormality of the left tibia and fibula. Electronically Signed   By: Ree Molt M.D.   On: 12/18/2023 17:20   Is due for Td Had Tdap in 2012       Treated with  Rocephin  injection 1 g  Bactrim  DS  HTN bp is stable today  No cp or palpitations or headaches or edema  No side effects to medicines  BP Readings from Last 3 Encounters:  12/22/23 125/70  12/18/23 (!) 186/96  09/29/23 130/88     Lab Results  Component Value Date   NA 133 (L) 09/24/2023   K 3.7 09/24/2023   CO2 25 09/24/2023   GLUCOSE 363 (H) 09/24/2023   BUN 9 09/24/2023   CREATININE 0.82 09/24/2023   CALCIUM  9.1 09/24/2023   GFR 85.74 01/23/2023   EGFR  83 09/24/2023   GFRNONAA >60 09/20/2023   Spironolactone  25 mg daily  Coreg  6.25 mg bid (? Possible 3.25) Entresto  bid 49-51 mg  Demadex  prn      Patient Active Problem List   Diagnosis Date Noted   Wound of left leg 12/22/2023   Patient cannot afford medications 07/09/2023   Nostril sore 02/02/2023   Paresthesias 02/02/2023   Poor balance 01/31/2023   Muscle weakness 01/31/2023   Chronic headaches 01/31/2023   Vasomotor rhinitis 01/31/2023   Papillary thyroid  carcinoma (HCC) 07/01/2022   Grief reaction 03/05/2022   Vitamin B12 deficiency 01/22/2022   Current use of proton pump inhibitor 11/24/2020   Colon cancer screening 11/24/2020   Vitamin D  deficiency 09/27/2019   Lumbar radiculopathy 12/16/2018   Poorly controlled type 2 diabetes mellitus with peripheral neuropathy (HCC) 12/16/2018   Bilateral carpal tunnel syndrome 11/24/2018   Family history of MS (multiple sclerosis) 09/29/2018   Routine general medical examination at a health care facility 12/19/2013   Palpitations 02/03/2013   Postsurgical hypothyroidism 04/28/2012   History of thyroid  cancer 04/27/2012   Cushing's syndrome 04/27/2012   Chronic diastolic heart failure (HCC) 02/10/2012   Nausea vomiting and diarrhea 01/25/2012   Diastolic CHF (HCC) 01/25/2012  OSA (obstructive sleep apnea) just got cpap at home this past week 01/17/12 01/17/2012   Chronic allergic rhinitis 10/26/2010   Insomnia 10/26/2010   OBESITY, MORBID 04/27/2010   HEMORRHOIDS, INTERNAL, WITH BLEEDING 08/17/2009   GERD 04/27/2008   Essential hypertension 11/03/2007   Hyperlipidemia associated with type 2 diabetes mellitus (HCC) 05/14/2007   OTHER ORGANIC SLEEP DISORDERS 03/09/2007   Type 2 diabetes mellitus with diabetic neuropathy, unspecified (HCC) 09/11/2006   POLYCYSTIC OVARIES 09/11/2006   Iron deficiency anemia 09/11/2006   Depression with anxiety 09/11/2006   EXTERNAL HEMORRHOIDS 09/11/2006   Asthma 09/11/2006   Past Medical  History:  Diagnosis Date   Allergy    SEASONAL   Anemia    iron deficiency   Anxiety    Arthritis    Asthma    a. 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   Cataract 02/23/2019   Dr. McCuen @ [facility]. Both eyes, small.   Cellulitis and abscess of leg 02/2014   CHF (congestive heart failure) (HCC)    Chronic kidney disease    /13   Complication of anesthesia    DIFFICULT WAKING- 2011   Depression    a. Stress reaction 08/2011 in multiple social stressors.   Diabetes mellitus    Dysrhythmia    Gastritis    GERD (gastroesophageal reflux disease)    Heart murmur    Hemorrhoid    Hyperlipidemia    Hypertension    Hyperthyroidism    Insomnia    Morbid obesity (HCC)    Neuromuscular disorder (HCC) 12/16/2018   Dr. Vear Rattan Neuro, mild L5 /S1 radiculopathy, mild DJD   OSA (obstructive sleep apnea) 01/17/2012   Papillary thyroid  carcinoma (HCC)    PCOS (polycystic ovarian syndrome)    Sleep apnea    C PAP   Thyroid  cancer (HCC) 2014   Vertigo    Past Surgical History:  Procedure Laterality Date   CATARACT EXTRACTION Left 04/13/2020   CATARACT EXTRACTION Right 06/01/2020   CHOLECYSTECTOMY     EYE SURGERY     HEMORRHOID SURGERY     INTRAUTERINE DEVICE INSERTION  03/25/2009   SHOULDER SURGERY  06/23/2009   THYROIDECTOMY  04/10/2012   Procedure: THYROIDECTOMY;  Surgeon: Alm Bouche, MD;  Location: [facility] OR;  Service: ENT;  Laterality: N/A;  Total Thyroidectomy   TONSILLECTOMY AND ADENOIDECTOMY  CHILD   TUBAL LIGATION     Social History   Tobacco Use   Smoking status: Former    Current packs/day: 0.00    Average packs/day: 0.2 packs/day for 5.0 years (1.0 ttl pk-yrs)    Types: Cigarettes    Start date: 03/26/1999    Quit date: 03/25/2004    Years since quitting: 19.7   Smokeless tobacco: Never  Vaping Use   Vaping status: Never Used  Substance Use Topics   Alcohol use: No   Drug use: No   Family History  Problem Relation  Age of Onset   Arthritis Mother        osteoarthritis   Asthma Mother    Hypertension Mother    Multiple sclerosis Mother    Anxiety disorder Mother    Depression Mother    Vision loss Mother    Cancer Father        colon cancer   Arthritis Father        osteoarthritis   Colon cancer Father    Colon polyps Father    Transient ischemic attack Father  COPD Father    Asthma Father    Stroke Father    Vision loss Father    Heart disease Maternal Grandfather    Ovarian cancer Maternal Aunt    Diabetes Paternal Aunt    Diabetes Paternal Uncle    Alcohol abuse Son    Drug abuse Son    Early death Son    Stomach cancer Neg Hx    Esophageal cancer Neg Hx    Allergies  Allergen Reactions   Tape Rash    Plastic tape   Buspirone Hcl Nausea Only   Glipizide     REACTION: sleepy, low sugar   Ivp Dye [Iodinated Contrast Media]     Shut kidneys down   Oxycodone-Acetaminophen  Itching   Current Outpatient Medications on File Prior to Visit  Medication Sig Dispense Refill   Albuterol  Sulfate (PROAIR  RESPICLICK) 108 (90 Base) MCG/ACT AEPB Inhale 2 puffs into the lungs every 6 (six) hours as needed. 1 each 2   buPROPion  (WELLBUTRIN  XL) 150 MG 24 hr tablet TAKE 1 TABLET BY MOUTH EVERY DAY 30 tablet 0   carvedilol  (COREG ) 6.25 MG tablet TAKE 1 TABLET 2 (TWO) TIMES DAILY WITH A MEAL. NEEDS FOLLOW UP APPOINTMENT FOR ANYMORE REFILLS 180 tablet 1   cetirizine  (ZYRTEC ) 10 MG tablet TAKE 1 TABLET BY MOUTH EVERY DAY 90 tablet 1   cholecalciferol (VITAMIN D3) 25 MCG (1000 UNIT) tablet Take 2,000 Units by mouth daily.     cyanocobalamin  (VITAMIN B12) 500 MCG tablet Take 500 mcg by mouth daily.     dorzolamide (TRUSOPT) 2 % ophthalmic solution Place 1 drop into both eyes 2 (two) times daily.     empagliflozin  (JARDIANCE ) 25 MG TABS tablet Take 1 tablet (25 mg total) by mouth daily before breakfast. 90 tablet 3   ferrous sulfate  325 (65 FE) MG tablet Take 325 mg by mouth 2 (two) times daily.        fluticasone  (FLONASE ) 50 MCG/ACT nasal spray Place 2 sprays into both nostrils daily as needed for allergies. 16 g 11   hydrOXYzine  (VISTARIL ) 25 MG capsule TAKE 1 CAPSULE BY MOUTH EVERY 8 HOURS AS NEEDED FOR ANXIETY OR ITCHING (INSOMNIA). 60 capsule 3   levothyroxine  (SYNTHROID ) 175 MCG tablet TAKE 1 TABLET BY MOUTH EVERY MORNING ON AN EMPTY STOMACH 45 tablet 5   metFORMIN  (GLUCOPHAGE ) 1000 MG tablet TAKE 1 TABLET (1,000 MG TOTAL) BY MOUTH TWICE A DAY WITH FOOD 60 tablet 0   pantoprazole  (PROTONIX ) 40 MG tablet Take 1 tablet (40 mg total) by mouth daily. 90 tablet 2   potassium chloride  (KLOR-CON ) 20 MEQ packet Take 20 mEq by mouth as needed.     rosuvastatin  (CRESTOR ) 20 MG tablet TAKE 1 TABLET BY MOUTH EVERY DAY 90 tablet 1   spironolactone  (ALDACTONE ) 25 MG tablet TAKE 1 TABLET (25 MG TOTAL) BY MOUTH DAILY. NEEDS FOLLOW UP APPOINTMENT FOR MORE REFILLS 9030 tablet 1   sulfamethoxazole -trimethoprim  (BACTRIM  DS) 800-160 MG tablet Take 1 tablet by mouth 2 (two) times daily for 10 days. 20 tablet 0   torsemide  (DEMADEX ) 20 MG tablet Take 1 tablet (20 mg total) by mouth daily as needed. 30 tablet 6   venlafaxine  XR (EFFEXOR -XR) 75 MG 24 hr capsule TAKE 1 CAPSULE BY MOUTH DAILY WITH BREAKFAST. 90 capsule 3   zolpidem  (AMBIEN ) 10 MG tablet Take 0.5-1 tablets (5-10 mg total) by mouth at bedtime as needed. 30 tablet 3   No current facility-administered medications on file prior to visit.  Review of Systems  Constitutional:  Negative for activity change, appetite change, fatigue, fever and unexpected weight change.  HENT:  Negative for congestion, ear pain, rhinorrhea, sinus pressure and sore throat.   Eyes:  Negative for pain, redness and visual disturbance.  Respiratory:  Negative for cough, shortness of breath and wheezing.   Cardiovascular:  Negative for chest pain and palpitations.  Gastrointestinal:  Negative for abdominal pain, blood in stool, constipation and diarrhea.  Endocrine:  Negative for polydipsia and polyuria.  Genitourinary:  Negative for dysuria, frequency and urgency.  Musculoskeletal:  Negative for arthralgias, back pain and myalgias.  Skin:  Positive for wound. Negative for pallor and rash.  Allergic/Immunologic: Negative for environmental allergies.  Neurological:  Negative for dizziness, syncope and headaches.  Hematological:  Negative for adenopathy. Does not bruise/bleed easily.  Psychiatric/Behavioral:  Negative for decreased concentration and dysphoric mood. The patient is not nervous/anxious.        Objective:   Physical Exam Constitutional:      General: She is not in acute distress.    Appearance: Normal appearance. She is well-developed. She is obese. She is not ill-appearing or diaphoretic.  HENT:     Head: Normocephalic and atraumatic.  Eyes:     Conjunctiva/sclera: Conjunctivae normal.     Pupils: Pupils are equal, round, and reactive to light.  Neck:     Thyroid : No thyromegaly.     Vascular: No carotid bruit or JVD.  Cardiovascular:     Rate and Rhythm: Normal rate and regular rhythm.     Pulses: Normal pulses.     Heart sounds: Normal heart sounds.     No gallop.  Pulmonary:     Effort: Pulmonary effort is normal. No respiratory distress.     Breath sounds: Normal breath sounds. No wheezing or rales.  Abdominal:     General: There is no distension or abdominal bruit.     Palpations: Abdomen is soft.  Musculoskeletal:     Cervical back: Normal range of motion and neck supple.     Right lower leg: No edema.     Left lower leg: No edema.  Lymphadenopathy:     Cervical: No cervical adenopathy.  Skin:    General: Skin is warm and dry.     Coloration: Skin is not pale.     Findings: No rash.     Comments: Vertical linear skin tear- left shin Mild collar of erythema Mild tenderness Healing granulation tissue/ dry  No drainage  Small area of skin tear right shin  Legs are well perfused   Neurological:     Mental  Status: She is alert.     Coordination: Coordination normal.     Deep Tendon Reflexes: Reflexes are normal and symmetric. Reflexes normal.  Psychiatric:        Mood and Affect: Mood normal.           Assessment & Plan:   Problem List Items Addressed This Visit       Cardiovascular and Mediastinum   Essential hypertension   Improved from recent UC visit   BP Readings from Last 1 Encounters:  12/22/23 125/70   No changes needed Most recent labs reviewed  Disc lifstyle change with low sodium diet and exercise   Current medicines Spironolactone  25 mg daily  Coreg  6.25 mg bid (? Possible 3.25) Entresto  bid 49-51 mg  Demadex  prn           Other   Wound of left leg -  Primary   Improved after treatment in UC  Reviewed UC records, lab results and studies in detail   In diabetic pt with good pedal pulses   Skin tear/laceration caused by fall with her dog Improved after rocephin  and on bactrim   Will finish bactrim  Sent keflex  for additional coverage  Instructed to stop using peroxide  Soap and water cleanse  Aquaphor or antibiotic oint  Cover if in dirty area  Call back and Er precautions noted in detail today    Follow up 1 wk for re check  Td today      Other Visit Diagnoses       Need for Td vaccine       Relevant Orders   Td : Tetanus/diphtheria >7yo Preservative  free (Completed)

## 2023-12-22 NOTE — Assessment & Plan Note (Signed)
 Improved from recent UC visit   BP Readings from Last 1 Encounters:  12/22/23 125/70   No changes needed Most recent labs reviewed  Disc lifstyle change with low sodium diet and exercise   Current medicines Spironolactone  25 mg daily  Coreg  6.25 mg bid (? Possible 3.25) Entresto  bid 49-51 mg  Demadex  prn

## 2023-12-22 NOTE — Assessment & Plan Note (Addendum)
 Improved after treatment in UC  Reviewed UC records, lab results and studies in detail   In diabetic pt with good pedal pulses   Skin tear/laceration caused by fall with her dog Improved after rocephin  and on bactrim   Will finish bactrim  Sent keflex  for additional coverage  Instructed to stop using peroxide  Soap and water cleanse  Aquaphor or antibiotic oint  Cover if in dirty area  Call back and Er precautions noted in detail today    Follow up 1 wk for re check  Td today

## 2023-12-29 ENCOUNTER — Encounter: Payer: Self-pay | Admitting: Family Medicine

## 2023-12-29 ENCOUNTER — Ambulatory Visit (INDEPENDENT_AMBULATORY_CARE_PROVIDER_SITE_OTHER): Admitting: Family Medicine

## 2023-12-29 VITALS — BP 139/78 | HR 82 | Temp 98.3°F | Ht 65.0 in | Wt 230.1 lb

## 2023-12-29 DIAGNOSIS — S81802S Unspecified open wound, left lower leg, sequela: Secondary | ICD-10-CM

## 2023-12-29 DIAGNOSIS — I10 Essential (primary) hypertension: Secondary | ICD-10-CM

## 2023-12-29 MED ORDER — SULFAMETHOXAZOLE-TRIMETHOPRIM 800-160 MG PO TABS: 1.0000 | ORAL_TABLET | Freq: Two times a day (BID) | ORAL | 0 refills | Status: DC

## 2023-12-29 NOTE — Progress Notes (Signed)
 Subjective:    Patient ID: [name], female    DOB: 04-Feb-1966, 58 y.o.   MRN: [id number]  HPI  Wt Readings from Last 3 Encounters:  12/29/23 230 lb 2 oz (104.4 kg)  12/22/23 231 lb 8 oz (105 kg)  09/29/23 230 lb 6.4 oz (104.5 kg)   38.29 kg/m  Vitals:   12/29/23 1528 12/29/23 1542  BP: (!) 154/86 139/78  Pulse: 82   Temp: 98.3 F (36.8 C)   SpO2: 97%      Pt presents for follow up of wound on LLE Skin tear caused by fall with her dog  Originally seen in UC  Had rocephin / and oral bactrim  We added keflex  on 9/29 for coverage of standard organisms and urged her to stop using peroxide   Today notes she started keflex  (last night)  Thought she was to start after finishing bactrim   No fever  Tight and sore  Using neosporin  Keeping clean with soap and water  Using neo sporin   DG Tibia/Fibula Left Result Date: 12/18/2023 CLINICAL DATA:  injury; swelling; anterior wound. EXAM: LEFT TIBIA AND FIBULA - 2 VIEW COMPARISON:  None Available. FINDINGS: No acute fracture or dislocation. No aggressive osseous lesion. Mild degenerative changes of imaged joints. Ankle mortise appears intact. Calcaneal spur noted along the Achilles tendon and Plantar aponeurosis attachment sites. No focal soft tissue swelling. No focal soft tissue defect or air within the soft tissue. No radiopaque foreign bodies. IMPRESSION: No acute osseous abnormality of the left tibia and fibula. Electronically Signed   By: Ree Molt M.D.   On: 12/18/2023 17:20          Patient Active Problem List   Diagnosis Date Noted   Wound of left leg 12/22/2023   Patient cannot afford medications 07/09/2023   Nostril sore 02/02/2023   Paresthesias 02/02/2023   Poor balance 01/31/2023   Muscle weakness 01/31/2023   Chronic headaches 01/31/2023   Vasomotor rhinitis 01/31/2023   Papillary thyroid  carcinoma (HCC) 07/01/2022   Grief reaction 03/05/2022   Vitamin B12 deficiency 01/22/2022   Current use  of proton pump inhibitor 11/24/2020   Colon cancer screening 11/24/2020   Vitamin D  deficiency 09/27/2019   Lumbar radiculopathy 12/16/2018   Poorly controlled type 2 diabetes mellitus with peripheral neuropathy (HCC) 12/16/2018   Bilateral carpal tunnel syndrome 11/24/2018   Family history of MS (multiple sclerosis) 09/29/2018   Routine general medical examination at a health care facility 12/19/2013   Palpitations 02/03/2013   Postsurgical hypothyroidism 04/28/2012   History of thyroid  cancer 04/27/2012   Cushing's syndrome 04/27/2012   Chronic diastolic heart failure (HCC) 02/10/2012   Nausea vomiting and diarrhea 01/25/2012   Diastolic CHF (HCC) 01/25/2012   OSA (obstructive sleep apnea) just got cpap at home this past week 01/17/12 01/17/2012   Chronic allergic rhinitis 10/26/2010   Insomnia 10/26/2010   OBESITY, MORBID 04/27/2010   HEMORRHOIDS, INTERNAL, WITH BLEEDING 08/17/2009   GERD 04/27/2008   Essential hypertension 11/03/2007   Hyperlipidemia associated with type 2 diabetes mellitus (HCC) 05/14/2007   OTHER ORGANIC SLEEP DISORDERS 03/09/2007   Type 2 diabetes mellitus with diabetic neuropathy, unspecified (HCC) 09/11/2006   POLYCYSTIC OVARIES 09/11/2006   Iron deficiency anemia 09/11/2006   Depression with anxiety 09/11/2006   EXTERNAL HEMORRHOIDS 09/11/2006   Asthma 09/11/2006   Past Medical History:  Diagnosis Date   Allergy    SEASONAL   Anemia    iron deficiency   Anxiety  Arthritis    Asthma    a. 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   Cataract 02/23/2019   Dr. McCuen @ [facility] Both eyes, small.   Cellulitis and abscess of leg 02/2014   CHF (congestive heart failure) (HCC)    Chronic kidney disease    /13   Complication of anesthesia    DIFFICULT WAKING- 2011   Depression    a. Stress reaction 08/2011 in multiple social stressors.   Diabetes mellitus    Dysrhythmia    Gastritis    GERD  (gastroesophageal reflux disease)    Heart murmur    Hemorrhoid    Hyperlipidemia    Hypertension    Hyperthyroidism    Insomnia    Morbid obesity (HCC)    Neuromuscular disorder (HCC) 12/16/2018   Dr. [facility], mild L5 /S1 radiculopathy, mild DJD   OSA (obstructive sleep apnea) 01/17/2012   Papillary thyroid  carcinoma (HCC)    PCOS (polycystic ovarian syndrome)    Sleep apnea    C PAP   Thyroid  cancer (HCC) 2014   Vertigo    Past Surgical History:  Procedure Laterality Date   CATARACT EXTRACTION Left 04/13/2020   CATARACT EXTRACTION Right 06/01/2020   CHOLECYSTECTOMY     EYE SURGERY     HEMORRHOID SURGERY     INTRAUTERINE DEVICE INSERTION  03/25/2009   SHOULDER SURGERY  06/23/2009   THYROIDECTOMY  04/10/2012   Procedure: THYROIDECTOMY;  Surgeon: Alm Bouche, MD;  Location: [facility] OR;  Service: ENT;  Laterality: N/A;  Total Thyroidectomy   TONSILLECTOMY AND ADENOIDECTOMY  CHILD   TUBAL LIGATION     Social History   Tobacco Use   Smoking status: Former    Current packs/day: 0.00    Average packs/day: 0.2 packs/day for 5.0 years (1.0 ttl pk-yrs)    Types: Cigarettes    Start date: 03/26/1999    Quit date: 03/25/2004    Years since quitting: 19.7   Smokeless tobacco: Never  Vaping Use   Vaping status: Never Used  Substance Use Topics   Alcohol use: No   Drug use: No   Family History  Problem Relation Age of Onset   Arthritis Mother        osteoarthritis   Asthma Mother    Hypertension Mother    Multiple sclerosis Mother    Anxiety disorder Mother    Depression Mother    Vision loss Mother    Cancer Father        colon cancer   Arthritis Father        osteoarthritis   Colon cancer Father    Colon polyps Father    Transient ischemic attack Father    COPD Father    Asthma Father    Stroke Father    Vision loss Father    Heart disease Maternal Grandfather    Ovarian cancer Maternal Aunt    Diabetes Paternal Aunt    Diabetes Paternal Uncle     Alcohol abuse Son    Drug abuse Son    Early death Son    Stomach cancer Neg Hx    Esophageal cancer Neg Hx    Allergies  Allergen Reactions   Tape Rash    Plastic tape   Buspirone Hcl Nausea Only   Glipizide     REACTION: sleepy, low sugar   Ivp Dye [Iodinated Contrast Media]     Shut kidneys down   Oxycodone-Acetaminophen  Itching  Current Outpatient Medications on File Prior to Visit  Medication Sig Dispense Refill   Albuterol  Sulfate (PROAIR  RESPICLICK) 108 (90 Base) MCG/ACT AEPB Inhale 2 puffs into the lungs every 6 (six) hours as needed. 1 each 2   buPROPion  (WELLBUTRIN  XL) 150 MG 24 hr tablet TAKE 1 TABLET BY MOUTH EVERY DAY 30 tablet 0   carvedilol  (COREG ) 6.25 MG tablet TAKE 1 TABLET 2 (TWO) TIMES DAILY WITH A MEAL. NEEDS FOLLOW UP APPOINTMENT FOR ANYMORE REFILLS 180 tablet 1   cephALEXin  (KEFLEX ) 500 MG capsule Take 1 capsule (500 mg total) by mouth 2 (two) times daily. 20 capsule 0   cetirizine  (ZYRTEC ) 10 MG tablet TAKE 1 TABLET BY MOUTH EVERY DAY 90 tablet 1   cholecalciferol (VITAMIN D3) 25 MCG (1000 UNIT) tablet Take 2,000 Units by mouth daily.     cyanocobalamin  (VITAMIN B12) 500 MCG tablet Take 500 mcg by mouth daily.     dorzolamide (TRUSOPT) 2 % ophthalmic solution Place 1 drop into both eyes 2 (two) times daily.     empagliflozin  (JARDIANCE ) 25 MG TABS tablet Take 1 tablet (25 mg total) by mouth daily before breakfast. 90 tablet 3   ferrous sulfate  325 (65 FE) MG tablet Take 325 mg by mouth 2 (two) times daily.       fluticasone  (FLONASE ) 50 MCG/ACT nasal spray Place 2 sprays into both nostrils daily as needed for allergies. 16 g 11   hydrOXYzine  (VISTARIL ) 25 MG capsule TAKE 1 CAPSULE BY MOUTH EVERY 8 HOURS AS NEEDED FOR ANXIETY OR ITCHING (INSOMNIA). 60 capsule 3   levothyroxine  (SYNTHROID ) 175 MCG tablet TAKE 1 TABLET BY MOUTH EVERY MORNING ON AN EMPTY STOMACH 45 tablet 5   metFORMIN  (GLUCOPHAGE ) 1000 MG tablet TAKE 1 TABLET (1,000 MG TOTAL) BY MOUTH TWICE A DAY  WITH FOOD 60 tablet 0   pantoprazole  (PROTONIX ) 40 MG tablet Take 1 tablet (40 mg total) by mouth daily. 90 tablet 2   potassium chloride  (KLOR-CON ) 20 MEQ packet Take 20 mEq by mouth as needed.     rosuvastatin  (CRESTOR ) 20 MG tablet TAKE 1 TABLET BY MOUTH EVERY DAY 90 tablet 1   spironolactone  (ALDACTONE ) 25 MG tablet TAKE 1 TABLET (25 MG TOTAL) BY MOUTH DAILY. NEEDS FOLLOW UP APPOINTMENT FOR MORE REFILLS 9030 tablet 1   torsemide  (DEMADEX ) 20 MG tablet Take 1 tablet (20 mg total) by mouth daily as needed. 30 tablet 6   venlafaxine  XR (EFFEXOR -XR) 75 MG 24 hr capsule TAKE 1 CAPSULE BY MOUTH DAILY WITH BREAKFAST. 90 capsule 3   zolpidem  (AMBIEN ) 10 MG tablet Take 0.5-1 tablets (5-10 mg total) by mouth at bedtime as needed. 30 tablet 3   No current facility-administered medications on file prior to visit.    Review of Systems  Constitutional:  Negative for activity change, appetite change, fatigue, fever and unexpected weight change.  HENT:  Negative for congestion, ear pain, rhinorrhea, sinus pressure and sore throat.   Eyes:  Negative for pain, redness and visual disturbance.  Respiratory:  Negative for cough, shortness of breath and wheezing.   Cardiovascular:  Negative for chest pain and palpitations.  Gastrointestinal:  Negative for abdominal pain, blood in stool, constipation and diarrhea.  Endocrine: Negative for polydipsia and polyuria.  Genitourinary:  Negative for dysuria, frequency and urgency.  Musculoskeletal:  Negative for arthralgias, back pain and myalgias.  Skin:  Positive for wound. Negative for pallor and rash.  Allergic/Immunologic: Negative for environmental allergies.  Neurological:  Negative for dizziness, syncope and  headaches.  Hematological:  Negative for adenopathy. Does not bruise/bleed easily.  Psychiatric/Behavioral:  Negative for decreased concentration and dysphoric mood. The patient is not nervous/anxious.        Objective:   Physical  Exam Constitutional:      General: She is not in acute distress.    Appearance: Normal appearance. She is obese. She is not ill-appearing or diaphoretic.  Eyes:     Conjunctiva/sclera: Conjunctivae normal.     Pupils: Pupils are equal, round, and reactive to light.  Cardiovascular:     Rate and Rhythm: Normal rate and regular rhythm.  Pulmonary:     Effort: Pulmonary effort is normal. No respiratory distress.     Breath sounds: No wheezing.  Musculoskeletal:     Comments: No pitting edema   Skin:    General: Skin is dry.     Comments: Skin tear (linear) on front of shin Scab is softening  Erythema improved at top but mildly increased on bottom  No change in swelling or tenderness No drainage No heat     Neurological:     Mental Status: She is alert.     Sensory: No sensory deficit.  Psychiatric:        Mood and Affect: Mood normal.           Assessment & Plan:   Problem List Items Addressed This Visit       Cardiovascular and Mediastinum   Essential hypertension   Blood pressure up slightly but pt had been active Re check at follow up in the next week   BP Readings from Last 1 Encounters:  12/29/23 139/78   No changes needed Most recent labs reviewed  Disc lifstyle change with low sodium diet and exercise   Current medicines Spironolactone  25 mg daily  Coreg  6.25 mg bid (? Possible 3.25) Entresto  bid 49-51 mg  Demadex  prn           Other   Wound of left leg - Primary   In diabetic female  Less erythema over top of wound and more around bottom  Scab is loosening up  Pt waited to start keflex  until bactim is done (I was not clear) Will take keflex  500 mg bid Bactrim - prescription for another week bid  Both with food  Soap and water cleanse / instructed to change neosporin to aquaphor or vaseline in case it is irritating  Reviewed xray 9/25- reassuring  Watch for increase redness/ swelling /pain or any fever Disc symptomatic care - see  instructions on AVS  Follow up end of this week or Monday for re check (earlier if needed)

## 2023-12-29 NOTE — Assessment & Plan Note (Addendum)
 In diabetic female  Less erythema over top of wound and more around bottom  Scab is loosening up  Pt waited to start keflex  until bactim is done (I was not clear) Will take keflex  500 mg bid Bactrim - prescription for another week bid  Both with food  Soap and water cleanse / instructed to change neosporin to aquaphor or vaseline in case it is irritating  Reviewed xray 9/25- reassuring  Watch for increase redness/ swelling /pain or any fever Disc symptomatic care - see instructions on AVS  Follow up end of this week or Monday for re check (earlier if needed)

## 2023-12-29 NOTE — Patient Instructions (Addendum)
 Keep wound clean with soap and water  Use vaseline or aquaphor instead of neosporin for now  Cover to protect from dirt when needed   Take the keflex   I sent more bactim in to your pharmacy  Both with food is fine   Let's re check late this week or early next week   If anything worsens before then please call (if more redness or pain or swelling or any fever)

## 2023-12-29 NOTE — Assessment & Plan Note (Signed)
 Blood pressure up slightly but pt had been active Re check at follow up in the next week   BP Readings from Last 1 Encounters:  12/29/23 139/78   No changes needed Most recent labs reviewed  Disc lifstyle change with low sodium diet and exercise   Current medicines Spironolactone  25 mg daily  Coreg  6.25 mg bid (? Possible 3.25) Entresto  bid 49-51 mg  Demadex  prn

## 2024-01-01 ENCOUNTER — Encounter: Payer: Self-pay | Admitting: Family Medicine

## 2024-01-01 ENCOUNTER — Telehealth: Payer: Self-pay | Admitting: *Deleted

## 2024-01-01 DIAGNOSIS — S81802S Unspecified open wound, left lower leg, sequela: Secondary | ICD-10-CM

## 2024-01-01 NOTE — Telephone Encounter (Signed)
-----   Message from [facility] sent at 12/29/2023  3:57 PM EDT ----- Please check in with her on wed/thurs to see how the wound is doing, thanks

## 2024-01-01 NOTE — Telephone Encounter (Signed)
 I saw the picture- agreed it looks about the same Is the pain any better or worse ? How about the swelling? (Hard to tell from the picture) Any drainage  I may want to get an xray (she has had one so far)  And we can consider wound care referral if not improving  Thanks

## 2024-01-01 NOTE — Telephone Encounter (Signed)
 Called to check in on pt. She said she thinks it still looks bad, pt said it looks about the same as when Dr. Randeen saw it at appt it hasn't gotten any worse but it hasn't gotten any better either. Pt said she is going to try and send a pic via mychart to PCP

## 2024-01-02 ENCOUNTER — Encounter: Payer: Self-pay | Admitting: Family Medicine

## 2024-01-02 ENCOUNTER — Ambulatory Visit
Admission: RE | Admit: 2024-01-02 | Discharge: 2024-01-02 | Disposition: A | Discharge status: Home / Self Care | Source: Ambulatory Visit | Attending: Family Medicine | Admitting: Family Medicine

## 2024-01-02 ENCOUNTER — Ambulatory Visit: Payer: Self-pay | Admitting: Family Medicine

## 2024-01-02 ENCOUNTER — Telehealth: Payer: Self-pay | Admitting: *Deleted

## 2024-01-02 DIAGNOSIS — M7989 Other specified soft tissue disorders: Secondary | ICD-10-CM | POA: Diagnosis not present

## 2024-01-02 DIAGNOSIS — S81802S Unspecified open wound, left lower leg, sequela: Secondary | ICD-10-CM

## 2024-01-02 DIAGNOSIS — M1712 Unilateral primary osteoarthritis, left knee: Secondary | ICD-10-CM | POA: Diagnosis not present

## 2024-01-02 DIAGNOSIS — S81802A Unspecified open wound, left lower leg, initial encounter: Secondary | ICD-10-CM | POA: Diagnosis not present

## 2024-01-02 DIAGNOSIS — M7732 Calcaneal spur, left foot: Secondary | ICD-10-CM | POA: Diagnosis not present

## 2024-01-02 NOTE — Telephone Encounter (Signed)
 I put an xray order in for whenever she can come  Reassuring that symptoms are not worse and I expect more swelling and discomfort after standing

## 2024-01-02 NOTE — Telephone Encounter (Signed)
 Called pt and she said the pain and swelling is about the same. She did say it does depend on if she's working or not. Pt said when she's working the swelling gets worse by the end of her shift and it's a little more painful. But if she is resting then it's about the same. This morning she notice the scab is getting a little bigger so maybe it's healing a little more today but it's not draining that she notice she has been keeping Vaseline on it like PCP advised   Pt is okay doing whatever PCP recommends, xray vs. Referral, please advise

## 2024-01-02 NOTE — Telephone Encounter (Signed)
 Copied from CRM [id number]. Topic: General - Other >> Jan 02, 2024  3:38 PM Berneda FALCON wrote: Reason for CRM: Patient called back for [place]. I let her know what the notes say in the chart and she states she can come in today before 4:15 for the Xray.

## 2024-01-02 NOTE — Telephone Encounter (Signed)
 Left VM requesting pt to call the office back,   **[id number]** IF PT CALLS BACK OKAY TO RELAY PCP'S MESSAGE AND SEE IF SHE CAN BE HERE TODAY BY 4:15 FOR XRAY (NO APPT NEEDED) IF NOT ANYTIME NEXT WEEK WE ARE OPEN IS FINE TO COME BACK FOR XRAY**

## 2024-01-07 ENCOUNTER — Encounter: Payer: Self-pay | Admitting: Family Medicine

## 2024-01-07 ENCOUNTER — Ambulatory Visit (INDEPENDENT_AMBULATORY_CARE_PROVIDER_SITE_OTHER): Admitting: Family Medicine

## 2024-01-07 VITALS — BP 130/62 | HR 74 | Temp 98.1°F | Ht 65.0 in | Wt 231.1 lb

## 2024-01-07 DIAGNOSIS — S81802S Unspecified open wound, left lower leg, sequela: Secondary | ICD-10-CM | POA: Diagnosis not present

## 2024-01-07 DIAGNOSIS — S81802D Unspecified open wound, left lower leg, subsequent encounter: Secondary | ICD-10-CM

## 2024-01-07 NOTE — Patient Instructions (Addendum)
 Wound is looking less infected  No more antibiotics for now   Continue soap /water and ointment  Cover loosely to protect it when needed   Watch for increased  Swelling  Redness  Pain  Drainage

## 2024-01-07 NOTE — Assessment & Plan Note (Addendum)
 Pt with diabetes and neuropathy Improved redness around wound  Still uncomfortable (more tightness and itching)  Finished keflex  and bactrim   Xray noted no signs of osteomyelitis  Improvement noted  Has appointment at wound care clinic on nov 3 -will keep this appointment   Continue soap and water cleanse  Ointment  Cover is in dirty area   Instructed to call if any increase in redness/swelling /pain  Can send photo if needed   Call back and Er precautions noted in detail today

## 2024-01-07 NOTE — Progress Notes (Signed)
 Subjective:    Patient ID: [name], female    DOB: 12/15/1965, 58 y.o.   MRN: [id number]  HPI  Wt Readings from Last 3 Encounters:  01/07/24 231 lb 2 oz (104.8 kg)  12/29/23 230 lb 2 oz (104.4 kg)  12/22/23 231 lb 8 oz (105 kg)   38.46 kg/m  Vitals:   01/07/24 1421 01/07/24 1439  BP: (!) 142/82 130/62  Pulse: 74   Temp: 98.1 F (36.7 C)   SpO2: 95%     Here for follow up of anterior left leg wound  Last visit added keflex  (was taking bactrim  alone)  Cleaning with soap and water   Finished antibiotic  No fever  Feels ok   Referral sent for wound care  Appointment nov 3rd   Less redness Still fairly sore at top of wound    Has DM with neuropathy    Xray reassuring/no signs of osteo  DG Tibia/Fibula Left Result Date: 01/02/2024 EXAM: 2 VIEW(S) XRAY OF THE LEFT TIBIA AND FIBULA 01/02/2024 04:20:02 PM COMPARISON: 12/18/2023 CLINICAL HISTORY: Wound on left lower leg overlying shin - some redness and swelling looking for signs of osteomyelitis. FINDINGS: BONES AND JOINTS: No acute fracture. No suggestion of osseous destruction. No joint dislocation. Calcaneal spurs. Mild degenerative changes in knee and ankle. SOFT TISSUES: Mild anterior soft tissue swelling. No subcutaneous gas or radiodense foreign body. IMPRESSION: 1. No radiographic evidence of osteomyelitis 2. Mild anterior soft tissue swelling 3. Calcaneal spurs 4. Mild degenerative changes in the knee and ankle Electronically signed by: Katheleen Faes MD 01/02/2024 04:28 PM EDT RP Workstation: [id number]   DG Tibia/Fibula Left Result Date: 12/18/2023 CLINICAL DATA:  injury; swelling; anterior wound. EXAM: LEFT TIBIA AND FIBULA - 2 VIEW COMPARISON:  None Available. FINDINGS: No acute fracture or dislocation. No aggressive osseous lesion. Mild degenerative changes of imaged joints. Ankle mortise appears intact. Calcaneal spur noted along the Achilles tendon and Plantar aponeurosis attachment sites. No focal  soft tissue swelling. No focal soft tissue defect or air within the soft tissue. No radiopaque foreign bodies. IMPRESSION: No acute osseous abnormality of the left tibia and fibula. Electronically Signed   By: Ree Molt M.D.   On: 12/18/2023 17:20      Patient Active Problem List   Diagnosis Date Noted   Wound of left leg 12/22/2023   Patient cannot afford medications 07/09/2023   Nostril sore 02/02/2023   Paresthesias 02/02/2023   Poor balance 01/31/2023   Muscle weakness 01/31/2023   Chronic headaches 01/31/2023   Vasomotor rhinitis 01/31/2023   Papillary thyroid  carcinoma (HCC) 07/01/2022   Grief reaction 03/05/2022   Vitamin B12 deficiency 01/22/2022   Current use of proton pump inhibitor 11/24/2020   Colon cancer screening 11/24/2020   Vitamin D  deficiency 09/27/2019   Lumbar radiculopathy 12/16/2018   Poorly controlled type 2 diabetes mellitus with peripheral neuropathy (HCC) 12/16/2018   Bilateral carpal tunnel syndrome 11/24/2018   Family history of MS (multiple sclerosis) 09/29/2018   Routine general medical examination at a health care facility 12/19/2013   Palpitations 02/03/2013   Postsurgical hypothyroidism 04/28/2012   History of thyroid  cancer 04/27/2012   Cushing's syndrome 04/27/2012   Chronic diastolic heart failure (HCC) 02/10/2012   Nausea vomiting and diarrhea 01/25/2012   Diastolic CHF (HCC) 01/25/2012   OSA (obstructive sleep apnea) just got cpap at home this past week 01/17/12 01/17/2012   Chronic allergic rhinitis 10/26/2010   Insomnia 10/26/2010   OBESITY, MORBID 04/27/2010  HEMORRHOIDS, INTERNAL, WITH BLEEDING 08/17/2009   GERD 04/27/2008   Essential hypertension 11/03/2007   Hyperlipidemia associated with type 2 diabetes mellitus (HCC) 05/14/2007   OTHER ORGANIC SLEEP DISORDERS 03/09/2007   Type 2 diabetes mellitus with diabetic neuropathy, unspecified (HCC) 09/11/2006   POLYCYSTIC OVARIES 09/11/2006   Iron deficiency anemia 09/11/2006    Depression with anxiety 09/11/2006   EXTERNAL HEMORRHOIDS 09/11/2006   Asthma 09/11/2006   Past Medical History:  Diagnosis Date   Allergy    SEASONAL   Anemia    iron deficiency   Anxiety    Arthritis    Asthma    a. 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   Cataract 02/23/2019   Dr. McCuen @ [facility] Both eyes, small.   Cellulitis and abscess of leg 02/2014   CHF (congestive heart failure) (HCC)    Chronic kidney disease    /13   Complication of anesthesia    DIFFICULT WAKING- 2011   Depression    a. Stress reaction 08/2011 in multiple social stressors.   Diabetes mellitus    Dysrhythmia    Gastritis    GERD (gastroesophageal reflux disease)    Heart murmur    Hemorrhoid    Hyperlipidemia    Hypertension    Hyperthyroidism    Insomnia    Morbid obesity (HCC)    Neuromuscular disorder (HCC) 12/16/2018   Dr. Vear [facility], mild L5 /S1 radiculopathy, mild DJD   OSA (obstructive sleep apnea) 01/17/2012   Papillary thyroid  carcinoma (HCC)    PCOS (polycystic ovarian syndrome)    Sleep apnea    C PAP   Thyroid  cancer (HCC) 2014   Vertigo    Past Surgical History:  Procedure Laterality Date   CATARACT EXTRACTION Left 04/13/2020   CATARACT EXTRACTION Right 06/01/2020   CHOLECYSTECTOMY     EYE SURGERY     HEMORRHOID SURGERY     INTRAUTERINE DEVICE INSERTION  03/25/2009   SHOULDER SURGERY  06/23/2009   THYROIDECTOMY  04/10/2012   Procedure: THYROIDECTOMY;  Surgeon: Alm Bouche, MD;  Location: [facility] OR;  Service: ENT;  Laterality: N/A;  Total Thyroidectomy   TONSILLECTOMY AND ADENOIDECTOMY  CHILD   TUBAL LIGATION     Social History   Tobacco Use   Smoking status: Former    Current packs/day: 0.00    Average packs/day: 0.2 packs/day for 5.0 years (1.0 ttl pk-yrs)    Types: Cigarettes    Start date: 03/26/1999    Quit date: 03/25/2004    Years since quitting: 19.8   Smokeless tobacco: Never  Vaping Use   Vaping status:  Never Used  Substance Use Topics   Alcohol use: No   Drug use: No   Family History  Problem Relation Age of Onset   Arthritis Mother        osteoarthritis   Asthma Mother    Hypertension Mother    Multiple sclerosis Mother    Anxiety disorder Mother    Depression Mother    Vision loss Mother    Cancer Father        colon cancer   Arthritis Father        osteoarthritis   Colon cancer Father    Colon polyps Father    Transient ischemic attack Father    COPD Father    Asthma Father    Stroke Father    Vision loss Father    Heart disease Maternal Grandfather  Ovarian cancer Maternal Aunt    Diabetes Paternal Aunt    Diabetes Paternal Uncle    Alcohol abuse Son    Drug abuse Son    Early death Son    Stomach cancer Neg Hx    Esophageal cancer Neg Hx    Allergies  Allergen Reactions   Tape Rash    Plastic tape   Buspirone Hcl Nausea Only   Glipizide     REACTION: sleepy, low sugar   Ivp Dye [Iodinated Contrast Media]     Shut kidneys down   Oxycodone-Acetaminophen  Itching   Current Outpatient Medications on File Prior to Visit  Medication Sig Dispense Refill   Albuterol  Sulfate (PROAIR  RESPICLICK) 108 (90 Base) MCG/ACT AEPB Inhale 2 puffs into the lungs every 6 (six) hours as needed. 1 each 2   buPROPion  (WELLBUTRIN  XL) 150 MG 24 hr tablet TAKE 1 TABLET BY MOUTH EVERY DAY 30 tablet 0   carvedilol  (COREG ) 6.25 MG tablet TAKE 1 TABLET 2 (TWO) TIMES DAILY WITH A MEAL. NEEDS FOLLOW UP APPOINTMENT FOR ANYMORE REFILLS 180 tablet 1   cetirizine  (ZYRTEC ) 10 MG tablet TAKE 1 TABLET BY MOUTH EVERY DAY 90 tablet 1   cholecalciferol (VITAMIN D3) 25 MCG (1000 UNIT) tablet Take 2,000 Units by mouth daily.     cyanocobalamin  (VITAMIN B12) 500 MCG tablet Take 500 mcg by mouth daily.     dorzolamide (TRUSOPT) 2 % ophthalmic solution Place 1 drop into both eyes 2 (two) times daily.     empagliflozin  (JARDIANCE ) 25 MG TABS tablet Take 1 tablet (25 mg total) by mouth daily before  breakfast. 90 tablet 3   ferrous sulfate  325 (65 FE) MG tablet Take 325 mg by mouth 2 (two) times daily.       fluticasone  (FLONASE ) 50 MCG/ACT nasal spray Place 2 sprays into both nostrils daily as needed for allergies. 16 g 11   hydrOXYzine  (VISTARIL ) 25 MG capsule TAKE 1 CAPSULE BY MOUTH EVERY 8 HOURS AS NEEDED FOR ANXIETY OR ITCHING (INSOMNIA). 60 capsule 3   levothyroxine  (SYNTHROID ) 175 MCG tablet TAKE 1 TABLET BY MOUTH EVERY MORNING ON AN EMPTY STOMACH 45 tablet 5   metFORMIN  (GLUCOPHAGE ) 1000 MG tablet TAKE 1 TABLET (1,000 MG TOTAL) BY MOUTH TWICE A DAY WITH FOOD 60 tablet 0   pantoprazole  (PROTONIX ) 40 MG tablet Take 1 tablet (40 mg total) by mouth daily. 90 tablet 2   potassium chloride  (KLOR-CON ) 20 MEQ packet Take 20 mEq by mouth as needed.     rosuvastatin  (CRESTOR ) 20 MG tablet TAKE 1 TABLET BY MOUTH EVERY DAY 90 tablet 1   spironolactone  (ALDACTONE ) 25 MG tablet TAKE 1 TABLET (25 MG TOTAL) BY MOUTH DAILY. NEEDS FOLLOW UP APPOINTMENT FOR MORE REFILLS 9030 tablet 1   torsemide  (DEMADEX ) 20 MG tablet Take 1 tablet (20 mg total) by mouth daily as needed. 30 tablet 6   venlafaxine  XR (EFFEXOR -XR) 75 MG 24 hr capsule TAKE 1 CAPSULE BY MOUTH DAILY WITH BREAKFAST. 90 capsule 3   zolpidem  (AMBIEN ) 10 MG tablet Take 0.5-1 tablets (5-10 mg total) by mouth at bedtime as needed. 30 tablet 3   No current facility-administered medications on file prior to visit.    Review of Systems  Constitutional:  Negative for fatigue and fever.  Gastrointestinal:  Negative for nausea and vomiting.  Musculoskeletal:  Negative for joint swelling.  Skin:  Positive for wound.       Objective:   Physical Exam Constitutional:  General: She is not in acute distress.    Appearance: Normal appearance. She is obese. She is not ill-appearing.  HENT:     Head: Normocephalic and atraumatic.  Cardiovascular:     Rate and Rhythm: Normal rate and regular rhythm.  Pulmonary:     Effort: Pulmonary effort is  normal. No respiratory distress.  Musculoskeletal:     Right lower leg: No edema.  Skin:    General: Skin is warm and dry.     Findings: No bruising.     Comments: Linear wound on left anterior shin  Scab is starting to lift inferiorly Granulation tissue noted  Mild swelling  Erythema (especially of inferior wound) is improved  No warmth  Mild tenderness of soft tissue surrounding   Normal rom of ankle/foot  Well perfused      Neurological:     Mental Status: She is alert.     Sensory: No sensory deficit.  Psychiatric:        Mood and Affect: Mood normal.           Assessment & Plan:   Problem List Items Addressed This Visit       Other   Wound of left leg - Primary   Pt with diabetes and neuropathy Improved redness around wound  Still uncomfortable (more tightness and itching)  Finished keflex  and bactrim   Xray noted no signs of osteomyelitis  Improvement noted  Has appointment at wound care clinic on nov 3 -will keep this appointment   Continue soap and water cleanse  Ointment  Cover is in dirty area   Instructed to call if any increase in redness/swelling /pain  Can send photo if needed   Call back and Er precautions noted in detail today

## 2024-01-12 ENCOUNTER — Encounter: Payer: Self-pay | Admitting: Family Medicine

## 2024-01-14 ENCOUNTER — Ambulatory Visit: Payer: Self-pay | Admitting: Internal Medicine

## 2024-01-26 ENCOUNTER — Encounter (HOSPITAL_BASED_OUTPATIENT_CLINIC_OR_DEPARTMENT_OTHER): Admitting: Internal Medicine

## 2024-01-30 ENCOUNTER — Other Ambulatory Visit: Payer: Self-pay | Admitting: Family Medicine

## 2024-02-02 NOTE — Telephone Encounter (Signed)
 Name of Medication: Ambien  Name of Pharmacy: [facility] [address] Last Fill or Written Date and Quantity: 09/25/23 #30 tab/ 3 refills  Last Office Visit and Type: Wound check 01/07/24 Next Office Visit and Type: none scheduled

## 2024-02-26 ENCOUNTER — Other Ambulatory Visit: Payer: Self-pay | Admitting: Family Medicine

## 2024-03-15 DIAGNOSIS — Z794 Long term (current) use of insulin: Secondary | ICD-10-CM | POA: Diagnosis not present

## 2024-03-15 DIAGNOSIS — E1165 Type 2 diabetes mellitus with hyperglycemia: Secondary | ICD-10-CM | POA: Diagnosis not present

## 2024-03-15 DIAGNOSIS — C73 Malignant neoplasm of thyroid gland: Secondary | ICD-10-CM | POA: Diagnosis not present

## 2024-03-15 DIAGNOSIS — E890 Postprocedural hypothyroidism: Secondary | ICD-10-CM | POA: Diagnosis not present

## 2024-03-22 ENCOUNTER — Other Ambulatory Visit: Payer: Self-pay | Admitting: Family Medicine

## 2024-04-25 ENCOUNTER — Other Ambulatory Visit: Payer: Self-pay | Admitting: Family Medicine
